# Patient Record
Sex: Female | Born: 1970
Health system: Southern US, Community
[De-identification: ages and names within clinical notes are randomized; demographics above are authoritative.]

## PROBLEM LIST (undated history)

## (undated) DIAGNOSIS — J849 Interstitial pulmonary disease, unspecified: Secondary | ICD-10-CM

## (undated) DIAGNOSIS — R002 Palpitations: Secondary | ICD-10-CM

## (undated) DIAGNOSIS — F419 Anxiety disorder, unspecified: Secondary | ICD-10-CM

## (undated) DIAGNOSIS — I420 Dilated cardiomyopathy: Secondary | ICD-10-CM

## (undated) DIAGNOSIS — F329 Major depressive disorder, single episode, unspecified: Secondary | ICD-10-CM

## (undated) DIAGNOSIS — I10 Essential (primary) hypertension: Secondary | ICD-10-CM

## (undated) DIAGNOSIS — A159 Respiratory tuberculosis unspecified: Secondary | ICD-10-CM

## (undated) DIAGNOSIS — F32A Depression, unspecified: Secondary | ICD-10-CM

## (undated) DIAGNOSIS — G4730 Sleep apnea, unspecified: Secondary | ICD-10-CM

## (undated) DIAGNOSIS — M51379 Other intervertebral disc degeneration, lumbosacral region without mention of lumbar back pain or lower extremity pain: Secondary | ICD-10-CM

## (undated) DIAGNOSIS — D75A Glucose-6-phosphate dehydrogenase (G6PD) deficiency without anemia: Secondary | ICD-10-CM

## (undated) DIAGNOSIS — E739 Lactose intolerance, unspecified: Secondary | ICD-10-CM

## (undated) DIAGNOSIS — R0600 Dyspnea, unspecified: Secondary | ICD-10-CM

## (undated) DIAGNOSIS — J40 Bronchitis, not specified as acute or chronic: Secondary | ICD-10-CM

## (undated) DIAGNOSIS — IMO0002 Reserved for concepts with insufficient information to code with codable children: Secondary | ICD-10-CM

## (undated) DIAGNOSIS — D649 Anemia, unspecified: Secondary | ICD-10-CM

## (undated) DIAGNOSIS — M069 Rheumatoid arthritis, unspecified: Secondary | ICD-10-CM

## (undated) DIAGNOSIS — M329 Systemic lupus erythematosus, unspecified: Secondary | ICD-10-CM

## (undated) DIAGNOSIS — M1990 Unspecified osteoarthritis, unspecified site: Secondary | ICD-10-CM

## (undated) DIAGNOSIS — J189 Pneumonia, unspecified organism: Secondary | ICD-10-CM

## (undated) DIAGNOSIS — K219 Gastro-esophageal reflux disease without esophagitis: Secondary | ICD-10-CM

## (undated) DIAGNOSIS — M7989 Other specified soft tissue disorders: Secondary | ICD-10-CM

## (undated) HISTORY — DX: Reserved for concepts with insufficient information to code with codable children: IMO0002

## (undated) HISTORY — DX: Systemic lupus erythematosus, unspecified: M32.9

## (undated) HISTORY — PX: TUBAL LIGATION: SHX77

## (undated) HISTORY — DX: Palpitations: R00.2

## (undated) HISTORY — DX: Other intervertebral disc degeneration, lumbosacral region without mention of lumbar back pain or lower extremity pain: M51.379

## (undated) HISTORY — DX: Interstitial pulmonary disease, unspecified: J84.9

## (undated) HISTORY — DX: Bronchitis, not specified as acute or chronic: J40

## (undated) HISTORY — DX: Unspecified osteoarthritis, unspecified site: M19.90

## (undated) HISTORY — DX: Lactose intolerance, unspecified: E73.9

## (undated) HISTORY — DX: Other specified soft tissue disorders: M79.89

## (undated) HISTORY — DX: Dilated cardiomyopathy: I42.0

## (undated) HISTORY — PX: ABDOMINAL HYSTERECTOMY: SHX81

## (undated) HISTORY — DX: Rheumatoid arthritis, unspecified: M06.9

## (undated) HISTORY — DX: Anemia, unspecified: D64.9

## (undated) HISTORY — DX: Glucose-6-phosphate dehydrogenase (G6PD) deficiency without anemia: D75.A

---
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
## 2008-06-23 HISTORY — PX: CHOLECYSTECTOMY: SHX55

## 2012-04-09 LAB — PULMONARY FUNCTION TEST

## 2014-08-08 DIAGNOSIS — I7300 Raynaud's syndrome without gangrene: Secondary | ICD-10-CM | POA: Insufficient documentation

## 2014-08-23 ENCOUNTER — Encounter: Payer: Self-pay | Admitting: Internal Medicine

## 2014-08-23 ENCOUNTER — Ambulatory Visit (INDEPENDENT_AMBULATORY_CARE_PROVIDER_SITE_OTHER): Payer: Managed Care, Other (non HMO) | Admitting: Internal Medicine

## 2014-08-23 ENCOUNTER — Other Ambulatory Visit (HOSPITAL_COMMUNITY): Payer: Self-pay | Admitting: Rheumatology

## 2014-08-23 VITALS — BP 122/80 | HR 86 | Ht 71.0 in | Wt 226.0 lb

## 2014-08-23 DIAGNOSIS — M3219 Other organ or system involvement in systemic lupus erythematosus: Secondary | ICD-10-CM | POA: Insufficient documentation

## 2014-08-23 DIAGNOSIS — M069 Rheumatoid arthritis, unspecified: Secondary | ICD-10-CM

## 2014-08-23 DIAGNOSIS — M329 Systemic lupus erythematosus, unspecified: Secondary | ICD-10-CM | POA: Insufficient documentation

## 2014-08-23 DIAGNOSIS — J849 Interstitial pulmonary disease, unspecified: Secondary | ICD-10-CM | POA: Insufficient documentation

## 2014-08-23 DIAGNOSIS — R0600 Dyspnea, unspecified: Secondary | ICD-10-CM

## 2014-08-23 NOTE — Patient Instructions (Addendum)
ICD-9-CM ICD-10-CM   1. Dyspnea 786.09 R06.00 CT Chest High Resolution     Pulmonary Function Test     2D Echocardiogram without contrast     Pulse oximetry, overnight  2. ILD (interstitial lung disease) 515 J84.9 CT Chest High Resolution     Pulmonary Function Test     Pulse oximetry, overnight  3. Lupus 710.0 M32.9   4. Rheumatoid arthritis 714.0 M06.9    - get HRCT chest  - get full PFT - get ECHO - rule out pulmonary hypertension - do ONO test on room air  - do 6 minute walk test  Followup  - return after all of above in next few weeks to see me or my NP Tammy

## 2014-08-23 NOTE — Progress Notes (Signed)
Subjective:    Patient ID: Shannon [name], female    DOB: 03-Jun-1971, 44 y.o.   MRN: [id number] PCP [name],[name], PA-C Referred by Dr [name]  HPI   IOV 08/23/2014  Chief Complaint  Patient presents with  . Pulmonary Consult    Pt referred by Dr. Corliss Skains for ILD. Pt c/o SOB with activity and rest, dry cough and chest tightness also with and without activity.    44 year old female referred for evaluation of autoimmune interstitial lung disease. She presents with her husband. In 2010 while living in [place] she reports she was diagnosed with lupus associated with rheumatoid arthritis in her joints. In 2012 she moved to live in [place] and several months after that started noticing insidious onset of shortness of breath. Local rheumatologist diagnosed her with interstitial lung disease. She was referred to Dr. Herma Carson at [facility] in [facility] and was started on CellCept/prednisone for autoimmune interstitial lung disease he and however, sometime around 2 years ago she ran out of medical insurance and stopped taking these medications. During this time her dyspnea has progressed. It is currently rated as moderate to severe. It is present on exertion and relieved by rest. Even minimal amount of exertion makes her very dyspneic. Now she has insurance and she did see Dr. Frederik Pear locally and she has autoimmune panel lab ordered. In addition exam revealed crackles and there for she's been referred here for reevaluation of interstitial lung disease and dyspnea. Dyspnea is associated with some chest tightness but no chest pain. This no associated dizziness.     has no past medical history on file.   reports that she has been smoking Cigarettes.  She has a 7 pack-year smoking history. She has never used smokeless tobacco.  Past Surgical History  Procedure Laterality Date  . Cesarean section      X 2  . Cholecystectomy    . Tubal ligation    .  Appendectomy      Allergies  Allergen Reactions  . Asa [Aspirin]     HIves  . Sulfa Antibiotics     G6PD deficiency     Immunization History  Administered Date(s) Administered  . Influenza Split 03/23/2014  . Pneumococcal-Unspecified 08/23/2014    Family History  Problem Relation Age of Onset  . Sarcoidosis Mother      Current outpatient prescriptions:  .  albuterol (PROVENTIL HFA;VENTOLIN HFA) 108 (90 BASE) MCG/ACT inhaler, Inhale 2 puffs into the lungs every 6 (six) hours as needed for wheezing or shortness of breath., Disp: , Rfl:  .  predniSONE (DELTASONE) 10 MG tablet, Take 10 mg by mouth daily with breakfast., Disp: , Rfl:  .  PRESCRIPTION MEDICATION, Muscle relaxer-pt unsure of name, Disp: , Rfl:      Review of Systems  Constitutional: Negative for fever and unexpected weight change.  HENT: Positive for trouble swallowing. Negative for congestion, dental problem, ear pain, nosebleeds, postnasal drip, rhinorrhea, sinus pressure, sneezing and sore throat.   Eyes: Negative for redness and itching.  Respiratory: Positive for cough, chest tightness and shortness of breath. Negative for wheezing.   Cardiovascular: Positive for palpitations and leg swelling.  Gastrointestinal: Positive for nausea. Negative for vomiting.  Genitourinary: Negative for dysuria.  Musculoskeletal: Positive for joint swelling.  Skin: Positive for rash.  Neurological: Negative for headaches.  Hematological: Does not bruise/bleed easily.  Psychiatric/Behavioral: Negative for dysphoric mood. The patient is not nervous/anxious.        Objective:  Physical Exam  Constitutional: She is oriented to person, place, and time. She appears well-developed and well-nourished. No distress.  Body mass index is 31.53 kg/(m^2).   HENT:  Head: Normocephalic and atraumatic.  Right Ear: External ear normal.  Left Ear: External ear normal.  Mouth/Throat: Oropharynx is clear and moist. No oropharyngeal  exudate.  Eyes: Conjunctivae and EOM are normal. Pupils are equal, round, and reactive to light. Right eye exhibits no discharge. Left eye exhibits no discharge. No scleral icterus.  Neck: Normal range of motion. Neck supple. No JVD present. No tracheal deviation present. No thyromegaly present.  Cardiovascular: Normal rate, regular rhythm, normal heart sounds and intact distal pulses.  Exam reveals no gallop and no friction rub.   No murmur heard. Pulmonary/Chest: Effort normal. No respiratory distress. She has no wheezes. She has rales. She exhibits no tenderness.  Crackles at base  Abdominal: Soft. Bowel sounds are normal. She exhibits no distension and no mass. There is no tenderness. There is no rebound and no guarding.  Musculoskeletal: Normal range of motion. She exhibits no edema or tenderness.  Lymphadenopathy:    She has no cervical adenopathy.  Neurological: She is alert and oriented to person, place, and time. She has normal reflexes. No cranial nerve deficit. She exhibits normal muscle tone. Coordination normal.  Skin: Skin is warm and dry. No rash noted. She is not diaphoretic. No erythema. No pallor.  tatoo  Psychiatric: She has a normal mood and affect. Her behavior is normal. Judgment and thought content normal.  Vitals reviewed.   Filed Vitals:   08/23/14 1336  BP: 122/80  Pulse: 86  Height: 5\' 11"  (1.803 m)  Weight: 226 lb (102.513 kg)  SpO2: 100%          Assessment & Plan:     ICD-9-CM ICD-10-CM   1. Dyspnea 786.09 R06.00 CT Chest High Resolution     Pulmonary Function Test     2D Echocardiogram without contrast     Pulse oximetry, overnight  2. ILD (interstitial lung disease) 515 J84.9 CT Chest High Resolution     Pulmonary Function Test     Pulse oximetry, overnight  3. Lupus 710.0 M32.9   4. Rheumatoid arthritis 714.0 M06.9    2 most common causes for involvement of the pulmonary system and the presence of autoimmune disease is both interstitial  lung disease and pulmonary hypertension. She's high pretest probably ready for interstitial lung disease but could also have pulmonary hypertension causing her symptoms. We need to proceed with establishing the presence of of these entities and also assess severity. Based on this we can discuss about therapy which for interstitial lung disease most likely involves CellCept for pulmonary hypertension specific diseas specific disease modulating drugs   REC  - get HRCT chest  - get full PFT - get ECHO - rule out pulmonary hypertension - do ONO test on room air  - do 6 minute walk test  Followup  - return after all of above in next few weeks to see me or my NP Tammy    Dr. , M.D., [facility].C.P Pulmonary and Critical Care Medicine Staff Physician [facility] Pulmonary and Critical Care Pager: [phone], If no answer or between  15:00h - 7:00h: call [phone]  08/23/2014 2:00 PM

## 2014-09-01 ENCOUNTER — Inpatient Hospital Stay: Admission: RE | Admit: 2014-09-01 | Payer: Managed Care, Other (non HMO) | Source: Ambulatory Visit

## 2014-09-01 ENCOUNTER — Other Ambulatory Visit (HOSPITAL_COMMUNITY): Payer: Self-pay | Admitting: Radiology

## 2014-09-01 ENCOUNTER — Ambulatory Visit (HOSPITAL_COMMUNITY): Payer: Managed Care, Other (non HMO) | Attending: Internal Medicine | Admitting: Radiology

## 2014-09-01 DIAGNOSIS — R0600 Dyspnea, unspecified: Secondary | ICD-10-CM

## 2014-09-01 DIAGNOSIS — R0602 Shortness of breath: Secondary | ICD-10-CM | POA: Diagnosis not present

## 2014-09-01 NOTE — Progress Notes (Signed)
Echocardiogram performed.  

## 2014-09-06 NOTE — Progress Notes (Signed)
Quick Note:  Called and spoke to pt. Informed pt of the results and recs per MR. Pt verbalized understanding and denied any further questions or concerns at this time. ______ 

## 2014-09-07 ENCOUNTER — Other Ambulatory Visit: Payer: Managed Care, Other (non HMO)

## 2014-09-07 ENCOUNTER — Inpatient Hospital Stay: Admission: RE | Admit: 2014-09-07 | Payer: Managed Care, Other (non HMO) | Source: Ambulatory Visit

## 2014-09-14 ENCOUNTER — Telehealth: Payer: Self-pay | Admitting: Internal Medicine

## 2014-09-14 NOTE — Telephone Encounter (Signed)
ONO 08/28/14 pulse ox < 88% - 0.21minutes, loest pulse ox 85%. Nearly normal. Does not need overnight o2.  She is seeing TP in a few weeks/ No need t call her unless she calls back

## 2014-09-20 NOTE — Telephone Encounter (Signed)
LMTCB with the pt's spouse  

## 2014-09-22 ENCOUNTER — Telehealth: Payer: Self-pay | Admitting: Internal Medicine

## 2014-09-25 ENCOUNTER — Other Ambulatory Visit: Payer: Managed Care, Other (non HMO)

## 2014-09-25 ENCOUNTER — Ambulatory Visit (HOSPITAL_COMMUNITY)
Admission: RE | Admit: 2014-09-25 | Discharge: 2014-09-25 | Disposition: A | Discharge status: Home / Self Care | Payer: Managed Care, Other (non HMO) | Source: Ambulatory Visit | Attending: Rheumatology | Admitting: Rheumatology

## 2014-09-25 ENCOUNTER — Other Ambulatory Visit (HOSPITAL_COMMUNITY): Payer: Self-pay | Admitting: Rheumatology

## 2014-09-25 ENCOUNTER — Ambulatory Visit (HOSPITAL_COMMUNITY): Payer: Managed Care, Other (non HMO)

## 2014-09-25 ENCOUNTER — Encounter (HOSPITAL_COMMUNITY): Payer: Self-pay

## 2014-09-25 DIAGNOSIS — M069 Rheumatoid arthritis, unspecified: Secondary | ICD-10-CM | POA: Diagnosis not present

## 2014-09-25 DIAGNOSIS — J84115 Respiratory bronchiolitis interstitial lung disease: Secondary | ICD-10-CM

## 2014-09-25 DIAGNOSIS — M329 Systemic lupus erythematosus, unspecified: Secondary | ICD-10-CM | POA: Diagnosis present

## 2014-09-25 NOTE — Telephone Encounter (Signed)
PCC's, please advise. Thanks 

## 2014-09-25 NOTE — Telephone Encounter (Signed)
This was authorized by dr Marchelle Gearing  On 09/22/14 pt aware auth# put in chart Shannon [name]

## 2014-09-28 NOTE — Telephone Encounter (Signed)
Lmtcb.

## 2014-09-29 NOTE — Telephone Encounter (Signed)
Pt is aware of ONO results. Nothing further was needed. 

## 2014-10-04 ENCOUNTER — Encounter: Payer: Self-pay | Admitting: Internal Medicine

## 2014-10-09 ENCOUNTER — Ambulatory Visit (INDEPENDENT_AMBULATORY_CARE_PROVIDER_SITE_OTHER): Payer: 59 | Admitting: Internal Medicine

## 2014-10-09 ENCOUNTER — Other Ambulatory Visit (INDEPENDENT_AMBULATORY_CARE_PROVIDER_SITE_OTHER): Payer: 59

## 2014-10-09 ENCOUNTER — Encounter: Payer: Self-pay | Admitting: Pulmonary Disease

## 2014-10-09 ENCOUNTER — Ambulatory Visit (INDEPENDENT_AMBULATORY_CARE_PROVIDER_SITE_OTHER): Payer: 59 | Admitting: Pulmonary Disease

## 2014-10-09 ENCOUNTER — Ambulatory Visit: Payer: 59 | Admitting: Adult Health

## 2014-10-09 VITALS — BP 124/74 | HR 70 | Temp 97.1°F | Ht 71.5 in | Wt 231.0 lb

## 2014-10-09 DIAGNOSIS — J849 Interstitial pulmonary disease, unspecified: Secondary | ICD-10-CM

## 2014-10-09 DIAGNOSIS — R0600 Dyspnea, unspecified: Secondary | ICD-10-CM

## 2014-10-09 LAB — PULMONARY FUNCTION TEST
DL/VA % pred: 76 %
DL/VA: 4.26 ml/min/mmHg/L
DLCO UNC % PRED: 43 %
DLCO UNC: 15.03 ml/min/mmHg
FEF 25-75 POST: 2.1 L/s
FEF 25-75 PRE: 2.07 L/s
FEF2575-%Change-Post: 1 %
FEF2575-%Pred-Post: 63 %
FEF2575-%Pred-Pre: 62 %
FEV1-%CHANGE-POST: -1 %
FEV1-%Pred-Post: 63 %
FEV1-%Pred-Pre: 64 %
FEV1-Post: 2.04 L
FEV1-Pre: 2.08 L
FEV1FVC-%Change-Post: 1 %
FEV1FVC-%Pred-Pre: 98 %
FEV6-%Change-Post: -2 %
FEV6-%PRED-POST: 63 %
FEV6-%PRED-PRE: 65 %
FEV6-POST: 2.46 L
FEV6-PRE: 2.52 L
FEV6FVC-%Change-Post: 0 %
FEV6FVC-%PRED-POST: 102 %
FEV6FVC-%PRED-PRE: 101 %
FVC-%Change-Post: -3 %
FVC-%PRED-POST: 62 %
FVC-%PRED-PRE: 64 %
FVC-PRE: 2.54 L
FVC-Post: 2.46 L
POST FEV6/FVC RATIO: 100 %
Post FEV1/FVC ratio: 83 %
Pre FEV1/FVC ratio: 82 %
Pre FEV6/FVC Ratio: 99 %
RV % pred: 28 %
RV: 0.56 L
TLC % pred: 56 %
TLC: 3.48 L

## 2014-10-09 LAB — CBC WITH DIFFERENTIAL/PLATELET
BASOS ABS: 0 10*3/uL (ref 0.0–0.1)
Basophils Relative: 0.2 % (ref 0.0–3.0)
EOS ABS: 0.1 10*3/uL (ref 0.0–0.7)
Eosinophils Relative: 1.5 % (ref 0.0–5.0)
HCT: 29.1 % — ABNORMAL LOW (ref 36.0–46.0)
Hemoglobin: 9.6 g/dL — ABNORMAL LOW (ref 12.0–15.0)
LYMPHS PCT: 31.4 % (ref 12.0–46.0)
Lymphs Abs: 1.3 10*3/uL (ref 0.7–4.0)
MCHC: 33.1 g/dL (ref 30.0–36.0)
MCV: 87 fl (ref 78.0–100.0)
Monocytes Absolute: 0.3 10*3/uL (ref 0.1–1.0)
Monocytes Relative: 6.8 % (ref 3.0–12.0)
NEUTROS PCT: 60.1 % (ref 43.0–77.0)
Neutro Abs: 2.4 10*3/uL (ref 1.4–7.7)
Platelets: 236 10*3/uL (ref 150.0–400.0)
RBC: 3.35 Mil/uL — ABNORMAL LOW (ref 3.87–5.11)
RDW: 15.1 % (ref 11.5–15.5)
WBC: 4.1 10*3/uL (ref 4.0–10.5)

## 2014-10-09 LAB — BASIC METABOLIC PANEL
BUN: 13 mg/dL (ref 6–23)
CALCIUM: 8.9 mg/dL (ref 8.4–10.5)
CHLORIDE: 104 meq/L (ref 96–112)
CO2: 28 meq/L (ref 19–32)
Creatinine, Ser: 0.67 mg/dL (ref 0.40–1.20)
GFR: 124.22 mL/min (ref >60.00)
GLUCOSE: 105 mg/dL — AB (ref 70–99)
POTASSIUM: 3.4 meq/L — AB (ref 3.5–5.1)
Sodium: 137 mEq/L (ref 135–145)

## 2014-10-09 LAB — HEPATIC FUNCTION PANEL
ALT: 10 U/L (ref 0–35)
AST: 14 U/L (ref 0–37)
Albumin: 3.7 g/dL (ref 3.5–5.2)
Alkaline Phosphatase: 57 U/L (ref 39–117)
Bilirubin, Direct: 0.1 mg/dL (ref 0.0–0.3)
Total Bilirubin: 0.3 mg/dL (ref 0.2–1.2)
Total Protein: 7.7 g/dL (ref 6.0–8.3)

## 2014-10-09 MED ORDER — DAPSONE 100 MG PO TABS: 100.0000 mg | ORAL_TABLET | Freq: Every day | ORAL | Status: DC

## 2014-10-09 MED ORDER — MYCOPHENOLATE MOFETIL 500 MG PO TABS: ORAL_TABLET | ORAL | Status: DC

## 2014-10-09 NOTE — Progress Notes (Signed)
PFT done today. 

## 2014-10-09 NOTE — Assessment & Plan Note (Signed)
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 She also has significant dyspnea on exertion, and underlying rheumatoid arthritis/lupus. She has been on CellCept in the past, and although she did not like taking it, she did not have any significant reaction. I think it is very likely that her autoimmune process is causing her interstitial disease, and I would therefore like to restart her on CellCept and keep her on low-dose prednisone for now. She will also need PCP prophylaxis, and will start dapsone since she is allergic to sulfa.  She will follow-up in 4 weeks with her primary pulmonologist, and understands that she has to keep up with her lab work.

## 2014-10-09 NOTE — Patient Instructions (Signed)
Stay on current prednisone Will start back on cellcept 500mg  each day for one week, then 500mg  one in am and pm for one week, then 1000mg  in am and 500mg  in pm for one week, then take 1000mg  in am and pm until next visit Will need to take dapsone 100mg  one a day while on cellcept Need to check bloodwork today, then every week until next visit.  followup with Dr. in 4 weeks.

## 2014-10-09 NOTE — Progress Notes (Signed)
   Subjective:    Patient ID: Shannon [name], female    DOB: 02-08-1971, 44 y.o.   MRN: [id number]  HPI The patient comes in today for follow-up of her recent high-resolution CT and also PFTs. She is normally followed by Dr. Marchelle Gearing, and has a known interstitial lung disease as well as a diagnosis of rheumatoid arthritis/lupus. She has been on CellCept in the past with low-dose prednisone and tolerated well, but did not have any insurance and was unable to continue. Her high-resolution CT showed classic changes for fibrotic NSIP, and her PFTs showed restriction as well as a severe decrease in diffusion capacity. The patient continues to have significant dyspnea on exertion. I have reviewed the above studies with her and her family member in detail, and answered all of their questions.   Review of Systems  Constitutional: Negative for fever and unexpected weight change.  HENT: Negative for congestion, dental problem, ear pain, nosebleeds, postnasal drip, rhinorrhea, sinus pressure, sneezing, sore throat and trouble swallowing.   Eyes: Negative for redness and itching.  Respiratory: Positive for cough, chest tightness, shortness of breath and wheezing.   Cardiovascular: Negative for palpitations and leg swelling.  Gastrointestinal: Negative for nausea and vomiting.  Genitourinary: Negative for dysuria.  Musculoskeletal: Negative for joint swelling.  Skin: Negative for rash.  Neurological: Negative for headaches.  Hematological: Does not bruise/bleed easily.  Psychiatric/Behavioral: Negative for dysphoric mood. The patient is not nervous/anxious.        Objective:   Physical Exam Overweight female in no acute distress Nose without purulence or discharge noted Neck without lymphadenopathy or thyromegaly Chest with basilar crackles, no wheezing Cardiac exam with regular rate and rhythm Lower extremities with mild edema, no cyanosis Alert and oriented, moves all 4 extremities.         Assessment & Plan:

## 2014-10-10 ENCOUNTER — Telehealth: Payer: Self-pay | Admitting: Internal Medicine

## 2014-10-10 ENCOUNTER — Telehealth: Payer: Self-pay | Admitting: Pulmonary Disease

## 2014-10-10 DIAGNOSIS — J849 Interstitial pulmonary disease, unspecified: Secondary | ICD-10-CM

## 2014-10-10 DIAGNOSIS — M329 Systemic lupus erythematosus, unspecified: Secondary | ICD-10-CM

## 2014-10-10 NOTE — Telephone Encounter (Signed)
Spoke with the pt  She states that she was prescribed Dapsone by [facility] 10/10/14 She is afraid to take this due to her anemia and possibility it will lower her RBC  She wants to know what MR thinks Please advise thanks

## 2014-10-10 NOTE — Telephone Encounter (Signed)
Received medical records from [facility]. Sent to Dr. Marchelle Gearing. 10/10/14/ss

## 2014-10-12 NOTE — Telephone Encounter (Signed)
Spoke with patient's husband, entered lab order and advised patient to come in to have drawn tomorrow.  Patient's husband will let patient know. Nothing further needed.

## 2014-10-12 NOTE — Telephone Encounter (Signed)
Let her know " I can understand her concern. Dr Shelle Iron and I  Had discussed before he recommended Dapsone so I was aware. This is to prevent rare but really possible unusual infections in body. However, given her concern for anemia - hold off dapsone for now. HAve her come in next few days and check G6PD .and I can discuss at fu"  If you have trouble ordering it le tm eknow

## 2014-10-19 ENCOUNTER — Other Ambulatory Visit (INDEPENDENT_AMBULATORY_CARE_PROVIDER_SITE_OTHER): Payer: 59

## 2014-10-19 DIAGNOSIS — J849 Interstitial pulmonary disease, unspecified: Secondary | ICD-10-CM

## 2014-10-19 DIAGNOSIS — M329 Systemic lupus erythematosus, unspecified: Secondary | ICD-10-CM

## 2014-10-19 LAB — BASIC METABOLIC PANEL
BUN: 11 mg/dL (ref 6–23)
CHLORIDE: 101 meq/L (ref 96–112)
CO2: 29 mEq/L (ref 19–32)
Calcium: 9.3 mg/dL (ref 8.4–10.5)
Creatinine, Ser: 0.65 mg/dL (ref 0.40–1.20)
GFR: 128.62 mL/min (ref >60.00)
GLUCOSE: 87 mg/dL (ref 70–99)
Potassium: 3.6 mEq/L (ref 3.5–5.1)
SODIUM: 135 meq/L (ref 135–145)

## 2014-10-19 LAB — CBC WITH DIFFERENTIAL/PLATELET
Basophils Absolute: 0 10*3/uL (ref 0.0–0.1)
Basophils Relative: 0.6 % (ref 0.0–3.0)
Eosinophils Absolute: 0 10*3/uL (ref 0.0–0.7)
Eosinophils Relative: 0.1 % (ref 0.0–5.0)
HCT: 31 % — ABNORMAL LOW (ref 36.0–46.0)
Hemoglobin: 10.2 g/dL — ABNORMAL LOW (ref 12.0–15.0)
Lymphocytes Relative: 27.9 % (ref 12.0–46.0)
Lymphs Abs: 1.7 10*3/uL (ref 0.7–4.0)
MCHC: 33 g/dL (ref 30.0–36.0)
MCV: 87.1 fl (ref 78.0–100.0)
Monocytes Absolute: 0.3 10*3/uL (ref 0.1–1.0)
Monocytes Relative: 4.9 % (ref 3.0–12.0)
Neutro Abs: 4.2 10*3/uL (ref 1.4–7.7)
Neutrophils Relative %: 66.5 % (ref 43.0–77.0)
PLATELETS: 305 10*3/uL (ref 150.0–400.0)
RBC: 3.56 Mil/uL — ABNORMAL LOW (ref 3.87–5.11)
RDW: 15.3 % (ref 11.5–15.5)
WBC: 6.3 10*3/uL (ref 4.0–10.5)

## 2014-10-19 LAB — HEPATIC FUNCTION PANEL
ALK PHOS: 61 U/L (ref 39–117)
ALT: 8 U/L (ref 0–35)
AST: 10 U/L (ref 0–37)
Albumin: 4 g/dL (ref 3.5–5.2)
BILIRUBIN TOTAL: 0.3 mg/dL (ref 0.2–1.2)
Bilirubin, Direct: 0.1 mg/dL (ref 0.0–0.3)
Total Protein: 7.9 g/dL (ref 6.0–8.3)

## 2014-10-23 LAB — GLUCOSE 6 PHOSPHATE DEHYDROGENASE: G-6PDH: 4.1 U/g Hgb — ABNORMAL LOW (ref 7.0–20.5)

## 2014-10-25 ENCOUNTER — Telehealth: Payer: Self-pay | Admitting: Internal Medicine

## 2014-10-25 DIAGNOSIS — D75A Glucose-6-phosphate dehydrogenase (G6PD) deficiency without anemia: Secondary | ICD-10-CM

## 2014-10-25 NOTE — Telephone Encounter (Signed)
Let Shannon [name] know that G6PD level is low and therefore Dapsone is no go.  Please also add G6PD def into her problem list Let her know that when she comes and sees me will review

## 2014-10-25 NOTE — Telephone Encounter (Signed)
Called and spoke to pt's husband. Left message to have pt call back. Pt's husband stated pt would not be home till Friday 5/6. Will call back then.

## 2014-10-27 ENCOUNTER — Other Ambulatory Visit (INDEPENDENT_AMBULATORY_CARE_PROVIDER_SITE_OTHER): Payer: 59

## 2014-10-27 DIAGNOSIS — J849 Interstitial pulmonary disease, unspecified: Secondary | ICD-10-CM | POA: Diagnosis not present

## 2014-10-27 LAB — BASIC METABOLIC PANEL
BUN: 9 mg/dL (ref 6–23)
CHLORIDE: 104 meq/L (ref 96–112)
CO2: 28 mEq/L (ref 19–32)
Calcium: 9 mg/dL (ref 8.4–10.5)
Creatinine, Ser: 0.71 mg/dL (ref 0.40–1.20)
GFR: 116.15 mL/min (ref >60.00)
GLUCOSE: 102 mg/dL — AB (ref 70–99)
POTASSIUM: 3.4 meq/L — AB (ref 3.5–5.1)
Sodium: 138 mEq/L (ref 135–145)

## 2014-10-27 LAB — CBC WITH DIFFERENTIAL/PLATELET
Basophils Absolute: 0 10*3/uL (ref 0.0–0.1)
Basophils Relative: 0.9 % (ref 0.0–3.0)
Eosinophils Absolute: 0 10*3/uL (ref 0.0–0.7)
Eosinophils Relative: 0.9 % (ref 0.0–5.0)
HCT: 30.1 % — ABNORMAL LOW (ref 36.0–46.0)
Hemoglobin: 9.9 g/dL — ABNORMAL LOW (ref 12.0–15.0)
LYMPHS PCT: 33.2 % (ref 12.0–46.0)
Lymphs Abs: 1.8 10*3/uL (ref 0.7–4.0)
MCHC: 33 g/dL (ref 30.0–36.0)
MCV: 87.7 fl (ref 78.0–100.0)
MONOS PCT: 6.1 % (ref 3.0–12.0)
Monocytes Absolute: 0.3 10*3/uL (ref 0.1–1.0)
NEUTROS ABS: 3.2 10*3/uL (ref 1.4–7.7)
Neutrophils Relative %: 58.9 % (ref 43.0–77.0)
Platelets: 238 10*3/uL (ref 150.0–400.0)
RBC: 3.43 Mil/uL — ABNORMAL LOW (ref 3.87–5.11)
RDW: 15.4 % (ref 11.5–15.5)
WBC: 5.4 10*3/uL (ref 4.0–10.5)

## 2014-10-27 LAB — HEPATIC FUNCTION PANEL
ALT: 9 U/L (ref 0–35)
AST: 11 U/L (ref 0–37)
Albumin: 3.5 g/dL (ref 3.5–5.2)
Alkaline Phosphatase: 55 U/L (ref 39–117)
BILIRUBIN TOTAL: 0.3 mg/dL (ref 0.2–1.2)
Bilirubin, Direct: 0.1 mg/dL (ref 0.0–0.3)
Total Protein: 7.6 g/dL (ref 6.0–8.3)

## 2014-10-31 DIAGNOSIS — D75A Glucose-6-phosphate dehydrogenase (G6PD) deficiency without anemia: Secondary | ICD-10-CM | POA: Insufficient documentation

## 2014-10-31 NOTE — Telephone Encounter (Signed)
lmtcb for pt. G6PD deficiency added to problem list.

## 2014-11-01 NOTE — Telephone Encounter (Signed)
[phone], pt cb

## 2014-11-01 NOTE — Telephone Encounter (Signed)
Spoke with the pt and notified of recs per MR  She verbalized understanding  Nothing further needed 

## 2014-11-02 ENCOUNTER — Other Ambulatory Visit (INDEPENDENT_AMBULATORY_CARE_PROVIDER_SITE_OTHER): Payer: 59

## 2014-11-02 ENCOUNTER — Telehealth: Payer: Self-pay | Admitting: Internal Medicine

## 2014-11-02 DIAGNOSIS — J849 Interstitial pulmonary disease, unspecified: Secondary | ICD-10-CM

## 2014-11-02 LAB — BASIC METABOLIC PANEL
BUN: 13 mg/dL (ref 6–23)
CALCIUM: 9 mg/dL (ref 8.4–10.5)
CO2: 29 mEq/L (ref 19–32)
Chloride: 104 mEq/L (ref 96–112)
Creatinine, Ser: 0.6 mg/dL (ref 0.40–1.20)
GFR: 141.05 mL/min (ref >60.00)
GLUCOSE: 87 mg/dL (ref 70–99)
POTASSIUM: 3.3 meq/L — AB (ref 3.5–5.1)
SODIUM: 137 meq/L (ref 135–145)

## 2014-11-02 LAB — HEPATIC FUNCTION PANEL
ALT: 8 U/L (ref 0–35)
AST: 11 U/L (ref 0–37)
Albumin: 3.4 g/dL — ABNORMAL LOW (ref 3.5–5.2)
Alkaline Phosphatase: 55 U/L (ref 39–117)
Bilirubin, Direct: 0.1 mg/dL (ref 0.0–0.3)
TOTAL PROTEIN: 7.5 g/dL (ref 6.0–8.3)
Total Bilirubin: 0.3 mg/dL (ref 0.2–1.2)

## 2014-11-02 LAB — CBC WITH DIFFERENTIAL/PLATELET
BASOS ABS: 0 10*3/uL (ref 0.0–0.1)
Basophils Relative: 0.5 % (ref 0.0–3.0)
Eosinophils Absolute: 0.1 10*3/uL (ref 0.0–0.7)
Eosinophils Relative: 1.3 % (ref 0.0–5.0)
HEMATOCRIT: 28.6 % — AB (ref 36.0–46.0)
Hemoglobin: 9.7 g/dL — ABNORMAL LOW (ref 12.0–15.0)
LYMPHS ABS: 1.6 10*3/uL (ref 0.7–4.0)
Lymphocytes Relative: 31.2 % (ref 12.0–46.0)
MCHC: 33.9 g/dL (ref 30.0–36.0)
MCV: 85.8 fl (ref 78.0–100.0)
MONOS PCT: 6.1 % (ref 3.0–12.0)
Monocytes Absolute: 0.3 10*3/uL (ref 0.1–1.0)
Neutro Abs: 3.1 10*3/uL (ref 1.4–7.7)
Neutrophils Relative %: 60.9 % (ref 43.0–77.0)
Platelets: 222 10*3/uL (ref 150.0–400.0)
RBC: 3.33 Mil/uL — ABNORMAL LOW (ref 3.87–5.11)
RDW: 15.4 % (ref 11.5–15.5)
WBC: 5.1 10*3/uL (ref 4.0–10.5)

## 2014-11-02 MED ORDER — MYCOPHENOLATE MOFETIL 500 MG PO TABS: 1000.0000 mg | ORAL_TABLET | Freq: Two times a day (BID) | ORAL | Status: DC

## 2014-11-02 NOTE — Telephone Encounter (Signed)
Rx has been sent in. Pt is aware. Nothing further was needed. 

## 2014-11-10 ENCOUNTER — Other Ambulatory Visit (INDEPENDENT_AMBULATORY_CARE_PROVIDER_SITE_OTHER): Payer: Managed Care, Other (non HMO)

## 2014-11-10 DIAGNOSIS — J849 Interstitial pulmonary disease, unspecified: Secondary | ICD-10-CM | POA: Diagnosis not present

## 2014-11-10 LAB — CBC WITH DIFFERENTIAL/PLATELET
Basophils Absolute: 0 10*3/uL (ref 0.0–0.1)
Basophils Relative: 0.9 % (ref 0.0–3.0)
EOS ABS: 0.1 10*3/uL (ref 0.0–0.7)
EOS PCT: 1.4 % (ref 0.0–5.0)
HCT: 29.1 % — ABNORMAL LOW (ref 36.0–46.0)
HEMOGLOBIN: 9.7 g/dL — AB (ref 12.0–15.0)
LYMPHS PCT: 37.7 % (ref 12.0–46.0)
Lymphs Abs: 1.7 10*3/uL (ref 0.7–4.0)
MCHC: 33.1 g/dL (ref 30.0–36.0)
MCV: 87.5 fl (ref 78.0–100.0)
MONOS PCT: 8.5 % (ref 3.0–12.0)
Monocytes Absolute: 0.4 10*3/uL (ref 0.1–1.0)
NEUTROS ABS: 2.4 10*3/uL (ref 1.4–7.7)
NEUTROS PCT: 51.5 % (ref 43.0–77.0)
Platelets: 261 10*3/uL (ref 150.0–400.0)
RBC: 3.33 Mil/uL — AB (ref 3.87–5.11)
RDW: 15.5 % (ref 11.5–15.5)
WBC: 4.6 10*3/uL (ref 4.0–10.5)

## 2014-11-10 LAB — HEPATIC FUNCTION PANEL
ALBUMIN: 3.7 g/dL (ref 3.5–5.2)
ALT: 9 U/L (ref 0–35)
AST: 12 U/L (ref 0–37)
Alkaline Phosphatase: 59 U/L (ref 39–117)
BILIRUBIN DIRECT: 0.1 mg/dL (ref 0.0–0.3)
TOTAL PROTEIN: 7.8 g/dL (ref 6.0–8.3)
Total Bilirubin: 0.3 mg/dL (ref 0.2–1.2)

## 2014-11-10 LAB — BASIC METABOLIC PANEL
BUN: 10 mg/dL (ref 6–23)
CHLORIDE: 104 meq/L (ref 96–112)
CO2: 27 mEq/L (ref 19–32)
Calcium: 8.9 mg/dL (ref 8.4–10.5)
Creatinine, Ser: 0.67 mg/dL (ref 0.40–1.20)
GFR: 124.17 mL/min (ref >60.00)
GLUCOSE: 84 mg/dL (ref 70–99)
POTASSIUM: 3.6 meq/L (ref 3.5–5.1)
Sodium: 136 mEq/L (ref 135–145)

## 2014-11-13 ENCOUNTER — Telehealth: Payer: Self-pay | Admitting: Internal Medicine

## 2014-11-13 NOTE — Telephone Encounter (Signed)
Pt returned call. Call her husband & give him info. Husband's name is [name] [phone]

## 2014-11-13 NOTE — Telephone Encounter (Signed)
Pt scheduled for appt for OV with TP as MR does not have anything sooner than July.  Scheduled for June 3 at 10:15 Nothing further needed.

## 2014-11-13 NOTE — Telephone Encounter (Signed)
atc pt, no answer, no vm set up.  Wcb.  

## 2014-11-15 ENCOUNTER — Ambulatory Visit: Payer: 59 | Admitting: Internal Medicine

## 2014-11-16 ENCOUNTER — Other Ambulatory Visit (INDEPENDENT_AMBULATORY_CARE_PROVIDER_SITE_OTHER): Payer: Managed Care, Other (non HMO)

## 2014-11-16 DIAGNOSIS — J849 Interstitial pulmonary disease, unspecified: Secondary | ICD-10-CM

## 2014-11-16 LAB — CBC WITH DIFFERENTIAL/PLATELET
BASOS ABS: 0 10*3/uL (ref 0.0–0.1)
Basophils Relative: 0.5 % (ref 0.0–3.0)
Eosinophils Absolute: 0 10*3/uL (ref 0.0–0.7)
Eosinophils Relative: 1 % (ref 0.0–5.0)
HCT: 30 % — ABNORMAL LOW (ref 36.0–46.0)
Hemoglobin: 9.7 g/dL — ABNORMAL LOW (ref 12.0–15.0)
Lymphocytes Relative: 34.3 % (ref 12.0–46.0)
Lymphs Abs: 1.5 10*3/uL (ref 0.7–4.0)
MCHC: 32.4 g/dL (ref 30.0–36.0)
MCV: 89 fl (ref 78.0–100.0)
MONO ABS: 0.3 10*3/uL (ref 0.1–1.0)
Monocytes Relative: 7.6 % (ref 3.0–12.0)
Neutro Abs: 2.5 10*3/uL (ref 1.4–7.7)
Neutrophils Relative %: 56.6 % (ref 43.0–77.0)
PLATELETS: 293 10*3/uL (ref 150.0–400.0)
RBC: 3.37 Mil/uL — ABNORMAL LOW (ref 3.87–5.11)
RDW: 15.1 % (ref 11.5–15.5)
WBC: 4.4 10*3/uL (ref 4.0–10.5)

## 2014-11-16 LAB — HEPATIC FUNCTION PANEL
ALT: 7 U/L (ref 0–35)
AST: 10 U/L (ref 0–37)
Albumin: 3.7 g/dL (ref 3.5–5.2)
Alkaline Phosphatase: 57 U/L (ref 39–117)
BILIRUBIN TOTAL: 0.3 mg/dL (ref 0.2–1.2)
Bilirubin, Direct: 0.1 mg/dL (ref 0.0–0.3)
Total Protein: 7.6 g/dL (ref 6.0–8.3)

## 2014-11-16 LAB — BASIC METABOLIC PANEL
BUN: 14 mg/dL (ref 6–23)
CALCIUM: 8.8 mg/dL (ref 8.4–10.5)
CHLORIDE: 105 meq/L (ref 96–112)
CO2: 30 mEq/L (ref 19–32)
Creatinine, Ser: 0.65 mg/dL (ref 0.40–1.20)
GFR: 128.58 mL/min (ref >60.00)
Glucose, Bld: 83 mg/dL (ref 70–99)
Potassium: 3.9 mEq/L (ref 3.5–5.1)
SODIUM: 137 meq/L (ref 135–145)

## 2014-11-17 NOTE — Progress Notes (Signed)
Quick Note:  lmtcb for pt. ______ 

## 2014-11-24 ENCOUNTER — Telehealth: Payer: Self-pay | Admitting: Internal Medicine

## 2014-11-24 ENCOUNTER — Other Ambulatory Visit (INDEPENDENT_AMBULATORY_CARE_PROVIDER_SITE_OTHER): Payer: Managed Care, Other (non HMO)

## 2014-11-24 ENCOUNTER — Encounter: Payer: Self-pay | Admitting: Adult Health

## 2014-11-24 ENCOUNTER — Ambulatory Visit (INDEPENDENT_AMBULATORY_CARE_PROVIDER_SITE_OTHER): Payer: Managed Care, Other (non HMO) | Admitting: Adult Health

## 2014-11-24 VITALS — BP 126/78 | HR 76 | Temp 98.1°F | Ht 71.0 in | Wt 224.0 lb

## 2014-11-24 DIAGNOSIS — J849 Interstitial pulmonary disease, unspecified: Secondary | ICD-10-CM | POA: Diagnosis not present

## 2014-11-24 LAB — BASIC METABOLIC PANEL
BUN: 8 mg/dL (ref 6–23)
CHLORIDE: 103 meq/L (ref 96–112)
CO2: 29 meq/L (ref 19–32)
Calcium: 8.9 mg/dL (ref 8.4–10.5)
Creatinine, Ser: 0.7 mg/dL (ref 0.40–1.20)
GFR: 118.02 mL/min (ref >60.00)
Glucose, Bld: 91 mg/dL (ref 70–99)
Potassium: 3.2 mEq/L — ABNORMAL LOW (ref 3.5–5.1)
SODIUM: 136 meq/L (ref 135–145)

## 2014-11-24 LAB — CBC WITH DIFFERENTIAL/PLATELET
Basophils Absolute: 0 10*3/uL (ref 0.0–0.1)
Basophils Relative: 0.6 % (ref 0.0–3.0)
EOS ABS: 0 10*3/uL (ref 0.0–0.7)
Eosinophils Relative: 0.5 % (ref 0.0–5.0)
HCT: 29.4 % — ABNORMAL LOW (ref 36.0–46.0)
HEMOGLOBIN: 9.8 g/dL — AB (ref 12.0–15.0)
LYMPHS ABS: 1.4 10*3/uL (ref 0.7–4.0)
Lymphocytes Relative: 31.8 % (ref 12.0–46.0)
MCHC: 33.2 g/dL (ref 30.0–36.0)
MCV: 87.2 fl (ref 78.0–100.0)
Monocytes Absolute: 0.3 10*3/uL (ref 0.1–1.0)
Monocytes Relative: 6.9 % (ref 3.0–12.0)
NEUTROS PCT: 60.2 % (ref 43.0–77.0)
Neutro Abs: 2.7 10*3/uL (ref 1.4–7.7)
Platelets: 284 10*3/uL (ref 150.0–400.0)
RBC: 3.37 Mil/uL — ABNORMAL LOW (ref 3.87–5.11)
RDW: 15.4 % (ref 11.5–15.5)
WBC: 4.5 10*3/uL (ref 4.0–10.5)

## 2014-11-24 LAB — HEPATIC FUNCTION PANEL
ALK PHOS: 62 U/L (ref 39–117)
ALT: 10 U/L (ref 0–35)
AST: 15 U/L (ref 0–37)
Albumin: 3.7 g/dL (ref 3.5–5.2)
BILIRUBIN TOTAL: 0.3 mg/dL (ref 0.2–1.2)
Bilirubin, Direct: 0.1 mg/dL (ref 0.0–0.3)
TOTAL PROTEIN: 7.8 g/dL (ref 6.0–8.3)

## 2014-11-24 NOTE — Telephone Encounter (Signed)
[employer] ok - K 3.2. Have her take kcl x 5 days  Also, why is she getting weeekly labs? Who ordered it? (has mindy name on it) for how long is going to get this?  Thanks  Dr. Kalman Shan, M.D., [facility].C.P Pulmonary and Critical Care Medicine Staff Physician [place] [place] Pulmonary and Critical Care Pager: [phone], If no answer or between  15:00h - 7:00h: call [phone]  11/24/2014 2:32 PM

## 2014-11-24 NOTE — Progress Notes (Signed)
Quick Note:  Pt's results were reviewed at OV on 6/3. Will sign off. ______

## 2014-11-24 NOTE — Progress Notes (Signed)
Subjective:    Patient ID: Shannon [name], female    DOB: 07-Feb-1971, 44 y.o.   MRN: [id number] PCP [name],[name], PA-C Referred by Dr [name]  HPI   IOV 08/23/2014  Chief Complaint  Patient presents with  . Pulmonary Consult    Pt referred by Dr. Corliss Skains for ILD. Pt c/o SOB with activity and rest, dry cough and chest tightness also with and without activity.    44 year old female referred for evaluation of autoimmune interstitial lung disease. She presents with her husband. In 2010 while living in [place] she reports she was diagnosed with lupus associated with rheumatoid arthritis in her joints. In 2012 she moved to live in [place] and several months after that started noticing insidious onset of shortness of breath. Local rheumatologist diagnosed her with interstitial lung disease. She was referred to Dr. Herma Carson at [facility] in [facility] and was started on CellCept/prednisone for autoimmune interstitial lung disease he and however, sometime around 2 years ago she ran out of medical insurance and stopped taking these medications. During this time her dyspnea has progressed. It is currently rated as moderate to severe. It is present on exertion and relieved by rest. Even minimal amount of exertion makes her very dyspneic. Now she has insurance and she did see Dr. Frederik Pear locally and she has autoimmune panel lab ordered. In addition exam revealed crackles and there for she's been referred here for reevaluation of interstitial lung disease and dyspnea. Dyspnea is associated with some chest tightness but no chest pain. This no associated dizzine   11/24/2014 Follow UP ILD  [name]  She has autoimmune ILD with RA/Lupus  She was seen 6 weeks ago, restarted on Cellcept.  Previously on cellcept but lost her insurance until recently.  On low dose prednisone 5mg  daily .  Last CT chest 4/4 showed ILD changes similar to 2013. Echo was ok  with EFG 55-60%, nml PAP . In March .   Did not take dapsone ,due to  GP6D deficiency.  Labs ok last week with nml LFT , no sign change in hbg. /wbc.  She is feeling better. Does feel her breathing is some better.  No flare of cough or wheezing.     Review of Systems  Constitutional:   No  weight loss, night sweats,  Fevers, chills, fatigue, or  lassitude.  HEENT:   No headaches,  Difficulty swallowing,  Tooth/dental problems, or  Sore throat,                No sneezing, itching, ear ache, nasal congestion, post nasal drip,   CV:  No chest pain,  Orthopnea, PND, swelling in lower extremities, anasarca, dizziness, palpitations, syncope.   GI  No heartburn, indigestion, abdominal pain, nausea, vomiting, diarrhea, change in bowel habits, loss of appetite, bloody stools.   Resp: + shortness of breath with exertion , not at  rest.  No excess mucus, no productive cough,  No non-productive cough,  No coughing up of blood.  No change in color of mucus.  No wheezing.  No chest wall deformity  Skin: no rash or lesions.  GU: no dysuria, change in color of urine, no urgency or frequency.  No flank pain, no hematuria   MS:  No joint pain or swelling.  No decreased range of motion.  No back pain.  Psych:  No change in mood or affect. No depression or anxiety.  No memory loss.  Objective:   Physical Exam  Constitutional: She is oriented to person, place, and time. She appears well-developed and well-nourished. No distress.  Body mass index is 31.53 kg/(m^2).   HENT:  Head: Normocephalic and atraumatic.  Right Ear: External ear normal.  Left Ear: External ear normal.  Mouth/Throat: Oropharynx is clear and moist. No oropharyngeal exudate.  Eyes: Conjunctivae and EOM are normal. Pupils are equal, round, and reactive to light. Right eye exhibits no discharge. Left eye exhibits no discharge. No scleral icterus.  Neck: Normal range of motion. Neck supple. No JVD present. No tracheal  deviation present. No thyromegaly present.  Cardiovascular: Normal rate, regular rhythm, normal heart sounds and intact distal pulses.  Exam reveals no gallop and no friction rub.   No murmur heard. Pulmonary/Chest: Effort normal. No respiratory distress. She has no wheezes. She has rales. She exhibits no tenderness.  Crackles at base  Abdominal: Soft. Bowel sounds are normal. She exhibits no distension and no mass. There is no tenderness. There is no rebound and no guarding.  Musculoskeletal: Normal range of motion. She exhibits no edema or tenderness.  Lymphadenopathy:    She has no cervical adenopathy.  Neurological: She is alert and oriented to person, place, and time. She has normal reflexes. No cranial nerve deficit. She exhibits normal muscle tone. Coordination normal.  Skin: Skin is warm and dry. No rash noted. She is not diaphoretic. No erythema. No pallor.  tatoo  Psychiatric: She has a normal mood and affect. Her behavior is normal. Judgment and thought content normal.  Vitals reviewed.      Assessment & Plan:

## 2014-11-24 NOTE — Patient Instructions (Signed)
Stay on current prednisone Continue Cellcept  1000mg  in am and pm  Labs today  Recheck labs in 2 weeks and As needed   Follow up Dr. in 4 weeks and As needed

## 2014-11-29 ENCOUNTER — Other Ambulatory Visit: Payer: Self-pay | Admitting: Emergency Medicine

## 2014-11-29 MED ORDER — POTASSIUM CHLORIDE CRYS ER 20 MEQ PO TBCR: 20.0000 meq | EXTENDED_RELEASE_TABLET | Freq: Every day | ORAL | Status: DC

## 2014-11-29 NOTE — Telephone Encounter (Signed)
Pt returned call. Call back after 3:00  -[phone]

## 2014-11-29 NOTE — Telephone Encounter (Signed)
Called and spoke to pt. Informed her of the results and recs per MR. Rx sent to preferred pharmacy Pt verbalized understanding and denied any further questions or concerns at this time.   MR--- TP wanted her to have labs the day of her OV on 6/3.

## 2014-11-29 NOTE — Telephone Encounter (Signed)
LMTCB for pt 

## 2014-12-02 NOTE — Assessment & Plan Note (Signed)
Autoimmune ILD with RA and Lupus  Previously treated with cellcept and low dose prednisone Will need to look at prophlaxis as unable to take dapsone.    Plan  Stay on current prednisone Continue Cellcept  1000mg  in am and pm  Labs today  Recheck labs in 2 weeks and As needed   Follow up Dr. in 4 weeks and As needed

## 2014-12-08 ENCOUNTER — Other Ambulatory Visit (INDEPENDENT_AMBULATORY_CARE_PROVIDER_SITE_OTHER): Payer: Managed Care, Other (non HMO)

## 2014-12-08 DIAGNOSIS — J849 Interstitial pulmonary disease, unspecified: Secondary | ICD-10-CM

## 2014-12-08 LAB — CBC WITH DIFFERENTIAL/PLATELET
Basophils Absolute: 0 10*3/uL (ref 0.0–0.1)
Basophils Relative: 0.5 % (ref 0.0–3.0)
EOS ABS: 0.1 10*3/uL (ref 0.0–0.7)
Eosinophils Relative: 2.2 % (ref 0.0–5.0)
HEMATOCRIT: 29 % — AB (ref 36.0–46.0)
Hemoglobin: 9.4 g/dL — ABNORMAL LOW (ref 12.0–15.0)
Lymphocytes Relative: 33.6 % (ref 12.0–46.0)
Lymphs Abs: 1.3 10*3/uL (ref 0.7–4.0)
MCHC: 32.2 g/dL (ref 30.0–36.0)
MCV: 88 fl (ref 78.0–100.0)
MONOS PCT: 7.9 % (ref 3.0–12.0)
Monocytes Absolute: 0.3 10*3/uL (ref 0.1–1.0)
NEUTROS ABS: 2.1 10*3/uL (ref 1.4–7.7)
Neutrophils Relative %: 55.8 % (ref 43.0–77.0)
Platelets: 238 10*3/uL (ref 150.0–400.0)
RBC: 3.3 Mil/uL — AB (ref 3.87–5.11)
RDW: 15.4 % (ref 11.5–15.5)
WBC: 3.7 10*3/uL — AB (ref 4.0–10.5)

## 2014-12-08 LAB — BASIC METABOLIC PANEL
BUN: 14 mg/dL (ref 6–23)
CALCIUM: 8.9 mg/dL (ref 8.4–10.5)
CHLORIDE: 105 meq/L (ref 96–112)
CO2: 26 mEq/L (ref 19–32)
CREATININE: 0.65 mg/dL (ref 0.40–1.20)
GFR: 128.54 mL/min (ref >60.00)
Glucose, Bld: 103 mg/dL — ABNORMAL HIGH (ref 70–99)
Potassium: 3 mEq/L — ABNORMAL LOW (ref 3.5–5.1)
Sodium: 136 mEq/L (ref 135–145)

## 2014-12-08 LAB — HEPATIC FUNCTION PANEL
ALK PHOS: 57 U/L (ref 39–117)
ALT: 10 U/L (ref 0–35)
AST: 11 U/L (ref 0–37)
Albumin: 3.5 g/dL (ref 3.5–5.2)
BILIRUBIN DIRECT: 0.1 mg/dL (ref 0.0–0.3)
BILIRUBIN TOTAL: 0.3 mg/dL (ref 0.2–1.2)
Total Protein: 7.1 g/dL (ref 6.0–8.3)

## 2014-12-09 ENCOUNTER — Other Ambulatory Visit: Payer: Self-pay | Admitting: Internal Medicine

## 2014-12-09 DIAGNOSIS — J849 Interstitial pulmonary disease, unspecified: Secondary | ICD-10-CM

## 2014-12-10 NOTE — Telephone Encounter (Signed)
Shannon [name] \ When is her net scheduled lab draw becase 12/08/14 also K is low at .When is next lab draw? At this next lab draw we need to check Mag and phos along with bmet. Let me know  Thanks  Dr. Kalman Shan, M.D., [facility].C.P Pulmonary and Critical Care Medicine Staff Physician [facility] [place] Pulmonary and Critical Care Pager: [phone], If no answer or between  15:00h - 7:00h: call [phone]  12/10/2014 8:55 AM

## 2014-12-11 MED ORDER — MYCOPHENOLATE MOFETIL 500 MG PO TABS: 1000.0000 mg | ORAL_TABLET | Freq: Two times a day (BID) | ORAL | Status: DC

## 2014-12-11 NOTE — Telephone Encounter (Signed)
Request from pharmacy for refill of Cellcept 500mg  -  Per last OV 11/24/14 pt is to continue taking Cellcept as directed.  Patient Instructions     Stay on current prednisone Continue Cellcept 1000mg  in am and pm  Labs today  Recheck labs in 2 weeks and As needed  Follow up Dr. 01/24/15 in 4 weeks and As needed     Refill sent to pharmacy.  Nothing further needed.

## 2014-12-12 ENCOUNTER — Telehealth: Payer: Self-pay | Admitting: Internal Medicine

## 2014-12-12 NOTE — Telephone Encounter (Signed)
Per TP pt is to have a CBC/diff recheck in 4 weeks with OV on 7/26 with MR. LMTCB for pt to inform her.   Notes Recorded by Julio Sicks, NP on 12/11/2014 at 2:23 PM K+ remains low.  Make sure she is taking K+ , if so increased to Twice daily And  Need follow up with PCP  LFT ok  WBC low normal will need to be recehceck on return ov in 4 weeks  Please contact office for sooner follow up if symptoms do not improve or worsen or seek emergency care

## 2014-12-12 NOTE — Telephone Encounter (Signed)
Notes Recorded by Tommie Sams, CMA on 12/11/2014 at 3:13 PM Called mobile # NA and VM not set up yet Called home # and LMTCB x1 w/ family member Notes Recorded by Julio Sicks, NP on 12/11/2014 at 2:23 PM K+ remains low.  Make sure she is taking K+ , if so increased to Twice daily And  Need follow up with PCP  LFT ok  WBC low normal will need to be recehceck on return ov in 4 weeks  Please contact office for sooner follow up if symptoms do not improve or worsen or seek emergency care     [facility]

## 2014-12-13 MED ORDER — POTASSIUM CHLORIDE CRYS ER 20 MEQ PO TBCR: 20.0000 meq | EXTENDED_RELEASE_TABLET | Freq: Every day | ORAL | Status: DC

## 2014-12-13 NOTE — Telephone Encounter (Signed)
Rx has been sent in. Pt is aware of TP's recommendations. Nothing further was needed.

## 2014-12-13 NOTE — Telephone Encounter (Signed)
Spoke with pt. She is aware of her lab results. States that she was only given 5 tablets of K+ to be taken for 5 days. Wants to know if she will need more of this, if so she will need a prescription.  TP - please advise. Thanks.

## 2014-12-13 NOTE — Telephone Encounter (Signed)
Pt returned call - [phone]

## 2014-12-13 NOTE — Telephone Encounter (Signed)
Yes please send KDur daily #30 , no refills  Will need ov with pcp to follow after this.

## 2014-12-22 ENCOUNTER — Other Ambulatory Visit: Payer: Managed Care, Other (non HMO)

## 2015-01-16 ENCOUNTER — Ambulatory Visit (INDEPENDENT_AMBULATORY_CARE_PROVIDER_SITE_OTHER): Payer: Managed Care, Other (non HMO) | Admitting: Internal Medicine

## 2015-01-16 ENCOUNTER — Encounter: Payer: Self-pay | Admitting: Internal Medicine

## 2015-01-16 ENCOUNTER — Other Ambulatory Visit (INDEPENDENT_AMBULATORY_CARE_PROVIDER_SITE_OTHER): Payer: Managed Care, Other (non HMO)

## 2015-01-16 VITALS — BP 148/98 | HR 82 | Ht 71.0 in | Wt 223.0 lb

## 2015-01-16 DIAGNOSIS — Z79899 Other long term (current) drug therapy: Secondary | ICD-10-CM | POA: Insufficient documentation

## 2015-01-16 DIAGNOSIS — R0600 Dyspnea, unspecified: Secondary | ICD-10-CM

## 2015-01-16 DIAGNOSIS — J849 Interstitial pulmonary disease, unspecified: Secondary | ICD-10-CM

## 2015-01-16 DIAGNOSIS — M069 Rheumatoid arthritis, unspecified: Secondary | ICD-10-CM

## 2015-01-16 LAB — PHOSPHORUS: PHOSPHORUS: 2.7 mg/dL (ref 2.3–4.6)

## 2015-01-16 LAB — CBC WITH DIFFERENTIAL/PLATELET
Basophils Absolute: 0 10*3/uL (ref 0.0–0.1)
Basophils Relative: 0.2 % (ref 0.0–3.0)
EOS ABS: 0.1 10*3/uL (ref 0.0–0.7)
EOS PCT: 1.6 % (ref 0.0–5.0)
HCT: 30.8 % — ABNORMAL LOW (ref 36.0–46.0)
Hemoglobin: 10.1 g/dL — ABNORMAL LOW (ref 12.0–15.0)
LYMPHS PCT: 33.9 % (ref 12.0–46.0)
Lymphs Abs: 1.5 10*3/uL (ref 0.7–4.0)
MCHC: 32.7 g/dL (ref 30.0–36.0)
MCV: 88.1 fl (ref 78.0–100.0)
MONOS PCT: 8.1 % (ref 3.0–12.0)
Monocytes Absolute: 0.4 10*3/uL (ref 0.1–1.0)
NEUTROS PCT: 56.2 % (ref 43.0–77.0)
Neutro Abs: 2.6 10*3/uL (ref 1.4–7.7)
Platelets: 281 10*3/uL (ref 150.0–400.0)
RBC: 3.5 Mil/uL — ABNORMAL LOW (ref 3.87–5.11)
RDW: 15.6 % — ABNORMAL HIGH (ref 11.5–15.5)
WBC: 4.6 10*3/uL (ref 4.0–10.5)

## 2015-01-16 LAB — HEPATIC FUNCTION PANEL
ALT: 8 U/L (ref 0–35)
AST: 10 U/L (ref 0–37)
Albumin: 3.5 g/dL (ref 3.5–5.2)
Alkaline Phosphatase: 61 U/L (ref 39–117)
Bilirubin, Direct: 0.1 mg/dL (ref 0.0–0.3)
Total Bilirubin: 0.2 mg/dL (ref 0.2–1.2)
Total Protein: 7.5 g/dL (ref 6.0–8.3)

## 2015-01-16 LAB — BASIC METABOLIC PANEL
BUN: 7 mg/dL (ref 6–23)
CALCIUM: 8.9 mg/dL (ref 8.4–10.5)
CO2: 28 mEq/L (ref 19–32)
CREATININE: 0.69 mg/dL (ref 0.40–1.20)
Chloride: 105 mEq/L (ref 96–112)
GFR: 119.92 mL/min (ref >60.00)
GLUCOSE: 82 mg/dL (ref 70–99)
Potassium: 3.9 mEq/L (ref 3.5–5.1)
Sodium: 138 mEq/L (ref 135–145)

## 2015-01-16 LAB — MAGNESIUM: MAGNESIUM: 2.1 mg/dL (ref 1.5–2.5)

## 2015-01-16 MED ORDER — MYCOPHENOLATE MOFETIL 500 MG PO TABS: 1000.0000 mg | ORAL_TABLET | Freq: Two times a day (BID) | ORAL | Status: DC

## 2015-01-16 NOTE — Progress Notes (Signed)
Subjective:    Patient ID: Shannon [name], female    DOB: [birth year], 44 y.o.   MRN: [id number]  HPI   PCP [name],[name], PA-C Referred by Dr [name]  HPI   IOV 08/23/2014  Chief Complaint  Patient presents with  . Pulmonary Consult    Pt referred by Dr. Corliss Skains for ILD. Pt c/o SOB with activity and rest, dry cough and chest tightness also with and without activity.    44 year old female referred for evaluation of autoimmune interstitial lung disease. She presents with her husband. In 2010 while living in [place] she reports she was diagnosed with lupus associated with rheumatoid arthritis in her joints. In 2012 she moved to live in [facility] and several months after that started noticing insidious onset of shortness of breath. Local rheumatologist diagnosed her with interstitial lung disease. She was referred to Dr. Herma Carson at [facility] in [facility] and was started on CellCept/prednisone for autoimmune interstitial lung disease he and however, sometime around 2 years ago she ran out of medical insurance and stopped taking these medications. During this time her dyspnea has progressed. It is currently rated as moderate to severe. It is present on exertion and relieved by rest. Even minimal amount of exertion makes her very dyspneic. Now she has insurance and she did see Dr. Frederik Pear locally and she has autoimmune panel lab ordered. In addition exam revealed crackles and there for she's been referred here for reevaluation of interstitial lung disease and dyspnea. Dyspnea is associated with some chest tightness but no chest pain. This no associated dizzine   11/24/2014 Follow UP ILD  [name]  She has autoimmune ILD with RA/Lupus  She was seen 6 weeks ago, restarted on Cellcept.  Previously on cellcept but lost her insurance until recently.  On low dose prednisone 5mg  daily .  Last CT chest 4/4 showed ILD changes similar to  2013. Echo was ok with EFG 55-60%, nml PAP . In March .   Did not take dapsone ,due to  GP6D deficiency.  Labs ok last week with nml LFT , no sign change in hbg. /wbc.  She is feeling better. Does feel her breathing is some better.  No flare of cough or wheezing.     OV 01/16/2015  Chief Complaint  Patient presents with  . Follow-up    Pt c/o DOE, mild dry cough, and chest tightness when SOB. Pt states the chest tightness has improved).    follow-up interstitial lung disease in the setting of rheumatoid arthritis Follow-up high risk medication use - on CellCept and prednisone since mid April 2016  - Presents with her husband. Both give a history. Overall she is doing better in terms of dyspnea after starting CellCept and prednisone. However the improvement this only moderate. She still left with a residual moderate dyspnea on exertion that is also made worse with bending or heart air and improved with rest and cool air. It is associated with some cough and wheezing. She takes albuterol inhaler which she feels helps only somewhat but not fully and not quickly enough. She is frustrated by this. In addition she's complaining of some associated right lower back paraspinal spasm for which massage gives her relief. He has never attended pulmonary rehabilitation.  - Lab review shows she has had problems with potassium and has had potassium supplementation. Last lab check was 12/08/2014. She is due for lab test right now  Current outpatient prescriptions:  .  albuterol (PROVENTIL HFA;VENTOLIN HFA) 108 (90 BASE) MCG/ACT inhaler, Inhale 2 puffs into the lungs every 6 (six) hours as needed for wheezing or shortness of breath., Disp: , Rfl:  .  ibuprofen (ADVIL,MOTRIN) 200 MG tablet, Take 200 mg by mouth every 6 (six) hours as needed., Disp: , Rfl:  .  mycophenolate (CELLCEPT) 500 MG tablet, Take 2 tablets (1,000 mg total) by mouth 2 (two) times daily., Disp: 120 tablet, Rfl: 0 .  oxycodone  (OXY-IR) 5 MG capsule, Take 5 mg by mouth 2 (two) times daily., Disp: , Rfl:  .  potassium chloride SA (K-DUR,KLOR-CON) 20 MEQ tablet, Take 1 tablet (20 mEq total) by mouth daily., Disp: 30 tablet, Rfl: 0 .  predniSONE (DELTASONE) 10 MG tablet, Take 5 mg by mouth daily with breakfast. , Disp: , Rfl:    Review of Systems  Constitutional: Negative for fever and unexpected weight change.  HENT: Negative for congestion, dental problem, ear pain, nosebleeds, postnasal drip, rhinorrhea, sinus pressure, sneezing, sore throat and trouble swallowing.   Eyes: Negative for redness and itching.  Respiratory: Positive for cough, chest tightness and shortness of breath. Negative for wheezing.   Cardiovascular: Negative for palpitations and leg swelling.  Gastrointestinal: Negative for nausea and vomiting.  Genitourinary: Negative for dysuria.  Musculoskeletal: Positive for joint swelling.  Skin: Negative for rash.  Neurological: Negative for headaches.  Hematological: Does not bruise/bleed easily.  Psychiatric/Behavioral: Negative for dysphoric mood. The patient is not nervous/anxious.        Objective:   Physical Exam  Constitutional: She is oriented to person, place, and time. She appears well-developed and well-nourished. No distress.  HENT:  Head: Normocephalic and atraumatic.  Right Ear: External ear normal.  Left Ear: External ear normal.  Mouth/Throat: Oropharynx is clear and moist. No oropharyngeal exudate.  Eyes: Conjunctivae and EOM are normal. Pupils are equal, round, and reactive to light. Right eye exhibits no discharge. Left eye exhibits no discharge. No scleral icterus.  Neck: Normal range of motion. Neck supple. No JVD present. No tracheal deviation present. No thyromegaly present.  Cardiovascular: Normal rate, regular rhythm, normal heart sounds and intact distal pulses.  Exam reveals no gallop and no friction rub.   No murmur heard. Pulmonary/Chest: Effort normal. No respiratory  distress. She has no wheezes. She has rales. She exhibits no tenderness.  Abdominal: Soft. Bowel sounds are normal. She exhibits no distension and no mass. There is no tenderness. There is no rebound and no guarding.  Musculoskeletal: Normal range of motion. She exhibits no edema or tenderness.  No deformity in spinr. ? Rt mid back paraspinal spasm +  Lymphadenopathy:    She has no cervical adenopathy.  Neurological: She is alert and oriented to person, place, and time. She has normal reflexes. No cranial nerve deficit. She exhibits normal muscle tone. Coordination normal.  Skin: Skin is warm and dry. No rash noted. She is not diaphoretic. No erythema. No pallor.  Psychiatric: She has a normal mood and affect. Her behavior is normal. Judgment and thought content normal.  Vitals reviewed.   Filed Vitals:   01/16/15 0926  BP: 148/98  Pulse: 82  Height: 5\' 11"  (1.803 m)  Weight: 223 lb (101.152 kg)  SpO2: 96%          Assessment & Plan:  ILD (interstitial lung disease) Rheumatoid arthritis   - disease appears stable - - continue cellcept 1000mg  bid and prednisone daily as before  - repeat  full PFT in 3 months - refer pulmonary rehab  High risk medication use  - do CBCD, Bmet, mag, phos, LFT 01/16/2015   Dyspnea/ SHortness of breath  - likely due to ILD but possible you have associated airway disease  - try advair 100/50 1 puff twice daily for 2 months   > 50% of this > 25 min visit spent in face to face counseling or coordination of care    Followup  - will call with blood reuslts  - pft in 3 months  -0 return in 3 months after PFT   Dr. Kalman Shan, M.D., [facility].C.P Pulmonary and Critical Care Medicine Staff Physician [facility] Pager: [phone], If no answer or between  15:00h - 7:00h: call [phone]  01/16/2015 10:01 AM

## 2015-01-16 NOTE — Patient Instructions (Addendum)
ILD (interstitial lung disease) due to Rheumatoid arthritis   - disease appears stable - continue cellcept 1000mg  bid and prednisone daily as before  - repeat full PFT in 3 months - refer pulmonary rehab - consider increase cellcept in future depending on fu PFT and symptoms and side effects  High risk medication use  - do CBCD, Bmet, mag, phos, LFT 01/16/2015   Dyspnea/ SHortness of breath  - likely due to ILD but possible you might  have associated airway disease  - try advair 100/50 1 puff twice daily for 2 months  Followup  - will call with blood reuslts  - pft in 3 months  -0 return in 3 months after PFT

## 2015-01-18 ENCOUNTER — Telehealth: Payer: Self-pay | Admitting: Internal Medicine

## 2015-01-18 NOTE — Telephone Encounter (Signed)
Called and spoke to pt's husband. Informed him of the results per MR. Pt verbalized understanding and denied any further questions or concerns at this time.   Notes Recorded by Kalman Shan, MD on 01/16/2015 at 4:46 PM Labsnormal

## 2015-01-18 NOTE — Progress Notes (Signed)
Quick Note:  Called and spoke to pt's husband. Informed him of the results per MR. Pt verbalized understanding and denied any further questions or concerns at this time. ______

## 2015-01-29 ENCOUNTER — Telehealth (HOSPITAL_COMMUNITY): Payer: Self-pay

## 2015-01-29 NOTE — Telephone Encounter (Signed)
I have called and left a message with [facility] to inquire about participation in Pulmonary Rehab per Dr. Jane Canary referral. Will send letter in mail and follow up.

## 2015-01-30 ENCOUNTER — Telehealth (HOSPITAL_COMMUNITY): Payer: Self-pay

## 2015-01-30 NOTE — Telephone Encounter (Signed)
Called patient regarding entrance to Pulmonary Rehab.  Patient states that they are interested in attending the program.  Shannon [name] is going to verify insurance coverage and follow up.

## 2015-02-06 ENCOUNTER — Telehealth (HOSPITAL_COMMUNITY): Payer: Self-pay

## 2015-02-06 NOTE — Telephone Encounter (Signed)
I have called and left a message with [facility] to inquire about participation in Pulmonary Rehab per Dr. Ramaswamy's referral. Will send letter in mail and follow up.  

## 2015-02-08 ENCOUNTER — Telehealth: Payer: Self-pay | Admitting: Internal Medicine

## 2015-02-08 DIAGNOSIS — J849 Interstitial pulmonary disease, unspecified: Secondary | ICD-10-CM

## 2015-02-08 NOTE — Telephone Encounter (Signed)
I called spoke with pt. She reports she has been on the cellcept for about 3-4 months now. Venetia Night he past 3 days she has been vomiting after she takes her cellcept and the pills come right back up when she does. She is taking these on an empty stomach bc she can't eat when she takes these either. Her chest  Is tight from this as well. Pt using ehr advair and albuterol inhaler. Please advise MR thanks  Allergies  Allergen Reactions  . Asa [Aspirin]     HIves  . Sulfa Antibiotics     G6PD deficiency      Current Outpatient Prescriptions on File Prior to Visit  Medication Sig Dispense Refill  . albuterol (PROVENTIL HFA;VENTOLIN HFA) 108 (90 BASE) MCG/ACT inhaler Inhale 2 puffs into the lungs every 6 (six) hours as needed for wheezing or shortness of breath.    Marland Kitchen ibuprofen (ADVIL,MOTRIN) 200 MG tablet Take 200 mg by mouth every 6 (six) hours as needed.    . mycophenolate (CELLCEPT) 500 MG tablet Take 2 tablets (1,000 mg total) by mouth 2 (two) times daily. 120 tablet 5  . oxycodone (OXY-IR) 5 MG capsule Take 5 mg by mouth 2 (two) times daily.    . potassium chloride SA (K-DUR,KLOR-CON) 20 MEQ tablet Take 1 tablet (20 mEq total) by mouth daily. 30 tablet 0  . predniSONE (DELTASONE) 10 MG tablet Take 5 mg by mouth daily with breakfast.      No current facility-administered medications on file prior to visit.

## 2015-02-08 NOTE — Telephone Encounter (Signed)
lmtcb x1 for pt. 

## 2015-02-08 NOTE — Telephone Encounter (Signed)
Please check with her if she doing the following correctly and let me know  CELLCEPT  -  - Oral dosage formulations (tablet, capsule, suspension) should be administered on an empty stomach (1 hour before or 2 hours after meals) to avoid variability in MPA absorption.  - should not be mixed with other medications.  - Delayed release tablets should not be crushed, cut, or chewed.  -- If a dose is missed, administer as soon as it is remembered.  - If it is close to the next scheduled dose, skip the missed dose and resume at next regularly scheduled time; do not double a dose to make up for a missed dos

## 2015-02-09 NOTE — Telephone Encounter (Signed)
LMTCB x2  

## 2015-02-12 ENCOUNTER — Telehealth (HOSPITAL_COMMUNITY): Payer: Self-pay

## 2015-02-12 NOTE — Telephone Encounter (Signed)
Called and spoke to pt. Reiterated the info about the blood work. Order placed. Pt verbalized understanding and denied any further questions or concerns at this time.

## 2015-02-12 NOTE — Telephone Encounter (Signed)
Patient called to discuss Pulmonary Rehab referral.  Patient states that she called insurance and was unable to gather information.  I will contact insurance company and follow up with patient.

## 2015-02-12 NOTE — Telephone Encounter (Signed)
Patient says that she is feeling a lot better now, she said she thinks she might have been coming down with a "bug". However, she says that she has been taking the CellCept in the morning at the same time as Prednisone and in the evening with her Oxycodone.  She said that she will stop taking it with other medications.  FYI to Dr. Marchelle Gearing

## 2015-02-12 NOTE — Telephone Encounter (Signed)
I spoke to patient and she is fine now. TOld her Ok to restart cellcept. ADvised she do BMET this week - can you order that please

## 2015-02-22 ENCOUNTER — Other Ambulatory Visit (INDEPENDENT_AMBULATORY_CARE_PROVIDER_SITE_OTHER): Payer: Managed Care, Other (non HMO)

## 2015-02-22 DIAGNOSIS — J849 Interstitial pulmonary disease, unspecified: Secondary | ICD-10-CM

## 2015-02-22 LAB — BASIC METABOLIC PANEL
BUN: 9 mg/dL (ref 6–23)
CALCIUM: 8.9 mg/dL (ref 8.4–10.5)
CO2: 25 mEq/L (ref 19–32)
Chloride: 106 mEq/L (ref 96–112)
Creatinine, Ser: 0.68 mg/dL (ref 0.40–1.20)
GFR: 121.9 mL/min (ref >60.00)
Glucose, Bld: 87 mg/dL (ref 70–99)
POTASSIUM: 4 meq/L (ref 3.5–5.1)
Sodium: 137 mEq/L (ref 135–145)

## 2015-02-23 ENCOUNTER — Telehealth: Payer: Self-pay | Admitting: Internal Medicine

## 2015-02-23 NOTE — Telephone Encounter (Signed)
  Let Henna Derderian know that BMET is normal  Recent Labs Lab 02/22/15 0847  NA 137  K 4.0  CL 106  CO2 25  GLUCOSE 87  BUN 9  CREATININE 0.68  CALCIUM 8.9

## 2015-02-23 NOTE — Telephone Encounter (Signed)
lmtcb X1 for pt with pt's spouse.   

## 2015-02-27 ENCOUNTER — Telehealth: Payer: Self-pay | Admitting: Internal Medicine

## 2015-02-27 NOTE — Telephone Encounter (Signed)
Let Shannon [name] know that BMET is normal  Last Labs        ----------------  lmtcb X1 for pt.

## 2015-02-27 NOTE — Telephone Encounter (Signed)
Spoke with pt, .aware of results.  

## 2015-02-28 NOTE — Telephone Encounter (Signed)
lmtcb for pt.  

## 2015-03-01 NOTE — Telephone Encounter (Signed)
LM with family member for pt to return call.  He stated she would be at work until 03/02/15 But would have her to return our call when she came home

## 2015-03-01 NOTE — Telephone Encounter (Signed)
Pt will call back

## 2015-03-02 NOTE — Telephone Encounter (Signed)
Patient returned call, can be reached at [phone] until 2:30 pm.

## 2015-03-02 NOTE — Telephone Encounter (Signed)
I spoke with patient about results and she verbalized understanding and had no questions 

## 2015-03-28 ENCOUNTER — Encounter: Payer: Self-pay | Admitting: Internal Medicine

## 2015-03-28 ENCOUNTER — Ambulatory Visit (INDEPENDENT_AMBULATORY_CARE_PROVIDER_SITE_OTHER): Payer: Managed Care, Other (non HMO) | Admitting: Internal Medicine

## 2015-03-28 VITALS — BP 138/88 | HR 84 | Ht 71.0 in | Wt 217.6 lb

## 2015-03-28 DIAGNOSIS — J849 Interstitial pulmonary disease, unspecified: Secondary | ICD-10-CM

## 2015-03-28 DIAGNOSIS — R0600 Dyspnea, unspecified: Secondary | ICD-10-CM

## 2015-03-28 LAB — PULMONARY FUNCTION TEST
DL/VA % PRED: 71 %
DL/VA: 3.96 ml/min/mmHg/L
DLCO UNC % PRED: 42 %
DLCO unc: 14.5 ml/min/mmHg
FEF 25-75 PRE: 2.22 L/s
FEF 25-75 Post: 3.39 L/sec
FEF2575-%Change-Post: 52 %
FEF2575-%Pred-Post: 102 %
FEF2575-%Pred-Pre: 67 %
FEV1-%CHANGE-POST: 8 %
FEV1-%Pred-Post: 70 %
FEV1-%Pred-Pre: 65 %
FEV1-Post: 2.26 L
FEV1-Pre: 2.09 L
FEV1FVC-%CHANGE-POST: 5 %
FEV1FVC-%PRED-PRE: 98 %
FEV6-%Change-Post: 1 %
FEV6-%PRED-POST: 67 %
FEV6-%Pred-Pre: 66 %
FEV6-Post: 2.59 L
FEV6-Pre: 2.55 L
FEV6FVC-%Pred-Post: 102 %
FEV6FVC-%Pred-Pre: 102 %
FVC-%CHANGE-POST: 2 %
FVC-%Pred-Post: 66 %
FVC-%Pred-Pre: 64 %
FVC-Post: 2.61 L
FVC-Pre: 2.55 L
POST FEV1/FVC RATIO: 87 %
PRE FEV6/FVC RATIO: 100 %
Post FEV6/FVC ratio: 100 %
Pre FEV1/FVC ratio: 82 %
RV % pred: 72 %
RV: 1.46 L
TLC % pred: 65 %
TLC: 4.02 L

## 2015-03-28 NOTE — Progress Notes (Signed)
PFT done today. 

## 2015-03-28 NOTE — Addendum Note (Signed)
Addended by: Nicanor Alcon on: 03/28/2015 02:03 PM   Modules accepted: Orders

## 2015-03-28 NOTE — Progress Notes (Signed)
Subjective:    Patient ID: Shannon [name], female    DOB: 09-Jun-1971, 44 y.o.   MRN: [id number]  HPI    PCP [name],[name], PA-C Referred by Dr [name]  HPI   IOV 08/23/2014  Chief Complaint  Patient presents with  . Pulmonary Consult    Pt referred by Dr. Corliss Skains for ILD. Pt c/o SOB with activity and rest, dry cough and chest tightness also with and without activity.    44 year old female referred for evaluation of autoimmune interstitial lung disease. She presents with her husband. In 2010 while living in [place] she reports she was diagnosed with lupus associated with rheumatoid arthritis in her joints. In 2012 she moved to live in [facility] and several months after that started noticing insidious onset of shortness of breath. Local rheumatologist diagnosed her with interstitial lung disease. She was referred to Dr. Herma Carson at [facility] in [facility] and was started on CellCept/prednisone for autoimmune interstitial lung disease he and however, sometime around 2 years ago she ran out of medical insurance and stopped taking these medications. During this time her dyspnea has progressed. It is currently rated as moderate to severe. It is present on exertion and relieved by rest. Even minimal amount of exertion makes her very dyspneic. Now she has insurance and she did see Dr. Frederik Pear locally and she has autoimmune panel lab ordered. In addition exam revealed crackles and there for she's been referred here for reevaluation of interstitial lung disease and dyspnea. Dyspnea is associated with some chest tightness but no chest pain. This no associated dizzine   11/24/2014 Follow UP ILD  [name]  She has autoimmune ILD with RA/Lupus  She was seen 6 weeks ago, restarted on Cellcept.  Previously on cellcept but lost her insurance until recently.  On low dose prednisone 5mg  daily .  Last CT chest 4/4 showed ILD changes similar to  2013. Echo was ok with EFG 55-60%, nml PAP . In March .   Did not take dapsone ,due to  GP6D deficiency.  Labs ok last week with nml LFT , no sign change in hbg. /wbc.  She is feeling better. Does feel her breathing is some better.  No flare of cough or wheezing.     OV 01/16/2015  Chief Complaint  Patient presents with  . Follow-up    Pt c/o DOE, mild dry cough, and chest tightness when SOB. Pt states the chest tightness has improved).    follow-up interstitial lung disease in the setting of rheumatoid arthritis Follow-up high risk medication use - on CellCept and prednisone since mid April 2016  - Presents with her husband. Both give a history. Overall she is doing better in terms of dyspnea after starting CellCept and prednisone. However the improvement this only moderate. She still left with a residual moderate dyspnea on exertion that is also made worse with bending or heart air and improved with rest and cool air. It is associated with some cough and wheezing. She takes albuterol inhaler which she feels helps only somewhat but not fully and not quickly enough. She is frustrated by this. In addition she's complaining of some associated right lower back paraspinal spasm for which massage gives her relief. He has never attended pulmonary rehabilitation.  - Lab review shows she has had problems with potassium and has had potassium supplementation. Last lab check was 12/08/2014. She is due for lab test right now   OV  03/28/2015   Chief Complaint  Patient presents with  . Follow-up    3 month follow up. Pt states that she is still having some problems with her breathing. Pt c/o of feeling chest tightness, chest pain and cough that is dry. Pt denies wheeze. Pt states that she did trial the Advair and does feel that it helped some.   Follow-up interstitial lung disease secondary to autoimmune process and associated dyspnea that seems out of proportion\  She presents with her husband.  She continues on CellCept and prednisone. In the last visit approximately 2-3 months ago she had out of proportion dyspnea. I gave her some Advair to try. She says this only helped a little bit. Overall she says that dyspnea still persists. It is worse than in the spring 2016 when she started CellCept and prednisone. It is stable since July 2016. It is moderate in intensity. Occurs randomly at rest but also with exertion. Occasionally relieved by rest but also happens at rest. Heat and humidity make it worse. Advair helped a little bit only. This no associated chest tightness or wheezing. She did try and enroll  in pulmonary rehabilitation but could not afford the the startup program and is waiting to hear from them for the maintenance program. She is frustrated by all the symptoms.  Pulmonary function test October 15,016 today FVC 2.55 L/64%, total lung capacity 4 L/65% and diffusion 14.5/42%. Overall no change since April 2016    Current outpatient prescriptions:  .  albuterol (PROVENTIL HFA;VENTOLIN HFA) 108 (90 BASE) MCG/ACT inhaler, Inhale 2 puffs into the lungs every 6 (six) hours as needed for wheezing or shortness of breath., Disp: , Rfl:  .  ibuprofen (ADVIL,MOTRIN) 200 MG tablet, Take 200 mg by mouth every 6 (six) hours as needed., Disp: , Rfl:  .  mycophenolate (CELLCEPT) 500 MG tablet, Take 2 tablets (1,000 mg total) by mouth 2 (two) times daily., Disp: 120 tablet, Rfl: 5 .  oxycodone (OXY-IR) 5 MG capsule, Take 5 mg by mouth 2 (two) times daily., Disp: , Rfl:  .  predniSONE (DELTASONE) 10 MG tablet, Take 5 mg by mouth daily with breakfast. , Disp: , Rfl:  .  potassium chloride SA (K-DUR,KLOR-CON) 20 MEQ tablet, Take 1 tablet (20 mEq total) by mouth daily. (Patient not taking: Reported on 03/28/2015), Disp: 30 tablet, Rfl: 0  Immunization History  Administered Date(s) Administered  . Influenza Split 03/23/2014  . Pneumococcal-Unspecified 08/23/2014      Review of Systems     Objective:   Physical Exam  Filed Vitals:   03/28/15 1333  BP: 138/88  Pulse: 84  SpO2: 92%    discussion visit mainly. Focal exam shows persistent bilateral crackles. No thrush       Assessment & Plan:     ICD-9-CM ICD-10-CM   1. ILD (interstitial lung disease) (HCC) 515 J84.9   2. Dyspnea 786.09 R06.00     out of proportion dyspnea could just be related to interstitial lung disease and being overweight and diastolic dysfunction and physical deconditioning. I strongly encouraged pulmonary rehabilitation and she will try to get into the maintenance program. Independent of this I offered the option of increasing the CellCept but she does not want to do it. She still is interested in knowing if she has other reasons for dyspnea. Therefore we will get a pulmonary stress test on the bike and see her for follow-up. We'll get blood work because she is on CellCept at the time of follow-up  next visit. She and her husband are in agreement with the plan    (> 50% of this 15 min visit spent in face to face counseling or/and coordination of care)   Dr. Kalman Shan, M.D., [facility].C.P Pulmonary and Critical Care Medicine Staff Physician [facility]monary and Critical Care Pager: [phone], If no answer or between  15:00h - 7:00h: call [phone]  03/28/2015 1:57 PM

## 2015-03-28 NOTE — Patient Instructions (Addendum)
1. ILD (interstitial lung disease) (HCC) 2. Dyspnea  - Refer pulmonary rehabilitation fall out of proportion dyspnea and interstitial lung disease - Cardiopulmonary stress test on the  bike by Lesia Hausen  Follow-up  - After cardiopulmonary stress test to see me Dr. Marchelle Gearing in next 4 weeks - Complete blood count, liver function test and chemistry at the time of return in 4 weeks

## 2015-04-04 ENCOUNTER — Encounter (HOSPITAL_COMMUNITY): Payer: Self-pay | Admitting: Emergency Medicine

## 2015-04-04 ENCOUNTER — Telehealth (HOSPITAL_COMMUNITY): Payer: Self-pay | Admitting: *Deleted

## 2015-04-04 ENCOUNTER — Other Ambulatory Visit: Payer: Self-pay

## 2015-04-04 ENCOUNTER — Ambulatory Visit (HOSPITAL_COMMUNITY): Payer: Managed Care, Other (non HMO)

## 2015-04-04 ENCOUNTER — Emergency Department (HOSPITAL_COMMUNITY): Payer: Managed Care, Other (non HMO)

## 2015-04-04 ENCOUNTER — Emergency Department (HOSPITAL_COMMUNITY)
Admission: EM | Admit: 2015-04-04 | Discharge: 2015-04-04 | Disposition: A | Discharge status: Home / Self Care | Payer: Managed Care, Other (non HMO) | Attending: Emergency Medicine | Admitting: Emergency Medicine

## 2015-04-04 DIAGNOSIS — Z7952 Long term (current) use of systemic steroids: Secondary | ICD-10-CM | POA: Insufficient documentation

## 2015-04-04 DIAGNOSIS — M069 Rheumatoid arthritis, unspecified: Secondary | ICD-10-CM | POA: Insufficient documentation

## 2015-04-04 DIAGNOSIS — Z862 Personal history of diseases of the blood and blood-forming organs and certain disorders involving the immune mechanism: Secondary | ICD-10-CM | POA: Insufficient documentation

## 2015-04-04 DIAGNOSIS — R55 Syncope and collapse: Secondary | ICD-10-CM

## 2015-04-04 DIAGNOSIS — R0600 Dyspnea, unspecified: Secondary | ICD-10-CM

## 2015-04-04 DIAGNOSIS — R0602 Shortness of breath: Secondary | ICD-10-CM | POA: Insufficient documentation

## 2015-04-04 DIAGNOSIS — Z79899 Other long term (current) drug therapy: Secondary | ICD-10-CM | POA: Insufficient documentation

## 2015-04-04 DIAGNOSIS — M3210 Systemic lupus erythematosus, organ or system involvement unspecified: Secondary | ICD-10-CM | POA: Insufficient documentation

## 2015-04-04 LAB — COMPREHENSIVE METABOLIC PANEL
ALK PHOS: 65 U/L (ref 38–126)
ALT: 13 U/L — ABNORMAL LOW (ref 14–54)
ANION GAP: 7 (ref 5–15)
AST: 19 U/L (ref 15–41)
Albumin: 3.5 g/dL (ref 3.5–5.0)
BILIRUBIN TOTAL: 0.5 mg/dL (ref 0.3–1.2)
BUN: 7 mg/dL (ref 6–20)
CALCIUM: 8.7 mg/dL — AB (ref 8.9–10.3)
CO2: 24 mmol/L (ref 22–32)
Chloride: 105 mmol/L (ref 101–111)
Creatinine, Ser: 0.66 mg/dL (ref 0.44–1.00)
GFR calc Af Amer: >60 mL/min (ref >60)
GLUCOSE: 84 mg/dL (ref 65–99)
POTASSIUM: 3.7 mmol/L (ref 3.5–5.1)
Sodium: 136 mmol/L (ref 135–145)
TOTAL PROTEIN: 7.7 g/dL (ref 6.5–8.1)

## 2015-04-04 LAB — CBC WITH DIFFERENTIAL/PLATELET
BASOS ABS: 0 10*3/uL (ref 0.0–0.1)
Basophils Relative: 0 %
EOS ABS: 0.1 10*3/uL (ref 0.0–0.7)
Eosinophils Relative: 2 %
HCT: 30 % — ABNORMAL LOW (ref 36.0–46.0)
Hemoglobin: 9.6 g/dL — ABNORMAL LOW (ref 12.0–15.0)
LYMPHS ABS: 1 10*3/uL (ref 0.7–4.0)
Lymphocytes Relative: 23 %
MCH: 28.2 pg (ref 26.0–34.0)
MCHC: 32 g/dL (ref 30.0–36.0)
MCV: 88.2 fL (ref 78.0–100.0)
Monocytes Absolute: 0.2 10*3/uL (ref 0.1–1.0)
Monocytes Relative: 5 %
NEUTROS ABS: 3.1 10*3/uL (ref 1.7–7.7)
Neutrophils Relative %: 70 %
PLATELETS: UNDETERMINED 10*3/uL (ref 150–400)
RBC: 3.4 MIL/uL — AB (ref 3.87–5.11)
RDW: 14.9 % (ref 11.5–15.5)
WBC: 4.4 10*3/uL (ref 4.0–10.5)

## 2015-04-04 LAB — I-STAT TROPONIN, ED: TROPONIN I, POC: 0 ng/mL (ref 0.00–0.08)

## 2015-04-04 NOTE — Telephone Encounter (Signed)
Called to inform Dr. Marchelle Gearing of Patient's ED visit for post Cardiopulmonary Exercise Stress Test syncope. Dr. Marchelle Gearing requeted test results as normal.

## 2015-04-04 NOTE — ED Provider Notes (Signed)
CSN: [id number]     Arrival date & time 04/04/15  1004 History   First MD Initiated Contact with Patient 04/04/15 1018     Chief Complaint  Patient presents with  . Chest Pain     (Consider location/radiation/quality/duration/timing/severity/associated sxs/prior Treatment) Patient is a 44 y.o. female presenting with chest pain. The history is provided by the patient.  Chest Pain Associated symptoms: shortness of breath   Associated symptoms: no abdominal pain, no back pain, no headache, no nausea, no numbness, not vomiting and no weakness    patient presents with shortness of breath chest pain and syncope. She has interstitial lung disease due to her lupus and has seen pulmonology. He scheduled a pulmonary stress test to evaluate her dyspnea. While she was doing it she became more short of breath and had some chest pain. Blood pressure was reassuring at that time. She also had good oxygenation. She then felt more lightheaded and passed out. She's feeling better now. There was some worry about whether her dyspnea was out of proportion to her lung findings. No injury from a fall. She feels much better now. No known cardiac history otherwise.  Past Medical History  Diagnosis Date  . Rheumatoid arthritis (HCC)   . Lupus (HCC)   . G6PD deficiency [facility])    Past Surgical History  Procedure Laterality Date  . Cesarean section      X 2  . Cholecystectomy    . Tubal ligation    . Appendectomy     Family History  Problem Relation Age of Onset  . Sarcoidosis Mother    Social History  Substance Use Topics  . Smoking status: Former Smoker -- 0.50 packs/day for 14 years    Types: Cigarettes    Quit date: 09/25/2014  . Smokeless tobacco: Never Used  . Alcohol Use: No   OB History    No data available     Review of Systems  Constitutional: Negative for activity change and appetite change.  Eyes: Negative for pain.  Respiratory: Positive for shortness of breath. Negative for chest  tightness.   Cardiovascular: Positive for chest pain. Negative for leg swelling.  Gastrointestinal: Negative for nausea, vomiting, abdominal pain and diarrhea.  Genitourinary: Negative for flank pain.  Musculoskeletal: Negative for back pain and neck stiffness.  Skin: Negative for rash.  Neurological: Positive for syncope. Negative for weakness, numbness and headaches.  Psychiatric/Behavioral: Negative for behavioral problems.      Allergies  Asa and Sulfa antibiotics  Home Medications   Prior to Admission medications   Medication Sig Start Date End Date Taking? Authorizing Provider  albuterol (PROVENTIL HFA;VENTOLIN HFA) 108 (90 BASE) MCG/ACT inhaler Inhale 2 puffs into the lungs every 6 (six) hours as needed for wheezing or shortness of breath.   Yes Historical Provider, MD  ibuprofen (ADVIL,MOTRIN) 200 MG tablet Take 200 mg by mouth every 6 (six) hours as needed.   Yes Historical Provider, MD  mycophenolate (CELLCEPT) 500 MG tablet Take 2 tablets (1,000 mg total) by mouth 2 (two) times daily. 01/16/15  Yes Kalman Shan, MD  oxycodone (OXY-IR) 5 MG capsule Take 5 mg by mouth 2 (two) times daily.   Yes Historical Provider, MD  predniSONE (DELTASONE) 10 MG tablet Take 5 mg by mouth daily with breakfast.    Yes Historical Provider, MD   BP 139/93 mmHg  Pulse 71  Temp(Src) 98.2 F (36.8 C) (Oral)  Resp 18  SpO2 97%  LMP 03/28/2015 (Exact Date) Physical Exam  Constitutional: She is oriented to person, place, and time. She appears well-developed and well-nourished.  HENT:  Head: Normocephalic and atraumatic.  Eyes: EOM are normal. Pupils are equal, round, and reactive to light.  Neck: Normal range of motion. Neck supple.  Cardiovascular: Normal rate, regular rhythm and normal heart sounds.   No murmur heard. Pulmonary/Chest: Effort normal. No respiratory distress. She has no wheezes. She has no rales.  Mildly harsh breath sounds without frank rales.  Abdominal: Soft. Bowel  sounds are normal. She exhibits no distension. There is no tenderness.  Musculoskeletal: Normal range of motion.  Neurological: She is alert and oriented to person, place, and time. No cranial nerve deficit.  Skin: Skin is warm and dry.  Psychiatric: She has a normal mood and affect. Her speech is normal.  Nursing note and vitals reviewed.   ED Course  Procedures (including critical care time) Labs Review Labs Reviewed  COMPREHENSIVE METABOLIC PANEL - Abnormal; Notable for the following:    Calcium 8.7 (*)    ALT 13 (*)    All other components within normal limits  CBC WITH DIFFERENTIAL/PLATELET - Abnormal; Notable for the following:    RBC 3.40 (*)    Hemoglobin 9.6 (*)    HCT 30.0 (*)    All other components within normal limits  I-STAT TROPOININ, ED    Imaging Review Dg Chest Portable 1 View  04/04/2015  CLINICAL DATA:  Chest pain EXAM: PORTABLE CHEST 1 VIEW COMPARISON:  10/09/2013 FINDINGS: Streaky bibasilar lung markings unchanged most consistent with mild scarring. Negative for pneumonia. No pleural effusion. Heart size upper normal. Negative for heart failure or mass lesion. IMPRESSION: Mild bibasilar scarring is unchanged. No superimposed acute abnormality. Electronically Signed   By: Marlan Palau M.D.   On: 04/04/2015 11:17   I have personally reviewed and evaluated these images and lab results as part of my medical decision-making.   EKG Interpretation   Date/Time:  Wednesday April 04 2015 10:06:38 EDT Ventricular Rate:  63 PR Interval:  127 QRS Duration: 114 QT Interval:  409 QTC Calculation: 419 R Axis:   77 Text Interpretation:  Sinus rhythm Incomplete right bundle branch block  Confirmed by Rubin Payor  MD, Harrold Donath [phone]) on 04/04/2015 1:03:39 PM      MDM   Final diagnoses:  Syncope, unspecified syncope type    Patient with syncope. Was during pulmonary stress test. Discussed with the CHF clinic in the people administer the test. Overall reassuring  test. Likely pulmonary cause of the dyspnea. EKG reassuring here. Enzymes negative. Chest x-ray at baseline. Will discharge home.    Benjiman Core, MD 04/04/15 1309

## 2015-04-04 NOTE — ED Notes (Signed)
Pt coming from CHF clinic where she started having CP during a stress test. CP was initially 10 out of 10. CP now 4 out of 10. PT alert x4. NAD at this time.

## 2015-04-04 NOTE — Discharge Instructions (Signed)

## 2015-04-10 DIAGNOSIS — R0600 Dyspnea, unspecified: Secondary | ICD-10-CM | POA: Diagnosis not present

## 2015-04-17 ENCOUNTER — Telehealth (HOSPITAL_COMMUNITY): Payer: Self-pay | Admitting: *Deleted

## 2015-04-24 ENCOUNTER — Telehealth: Payer: Self-pay | Admitting: Internal Medicine

## 2015-04-24 DIAGNOSIS — J849 Interstitial pulmonary disease, unspecified: Secondary | ICD-10-CM

## 2015-04-24 NOTE — Telephone Encounter (Signed)
Patient notified of results of CPST.  Referral ordered for Dr. Marca Ancona for Right Heart Cath to Evaluate for Pulmonary Hypertension.  Patient notified of referral order.  Nothing further needed. Closing encounter

## 2015-04-24 NOTE — Telephone Encounter (Signed)
Please set her up with Dr Signe Colt Lean for right heart cath to eval for pulmonary hypertension - let her know tht CPST showed that beyond lungs she could be limited because of blood circulation in her lungs-heart    Conclusion: Exercise testing with gas-exchange demonstrates a severe functional impairment when compared to matched sedentary norms. At peak exercise, it appears the patient is severely Limited. Despite having restrictive lung disease with ILD she did not have ventilatory limitation or desaturation though she was somewhat tachypneic compared to norm. Limitation could be circulatory with excess deadspace suggestive of this. Clinical correlation recommended

## 2015-04-24 NOTE — Telephone Encounter (Signed)
Called spoke with pt. She is requesting her CPST results from 10/12 if it showed anything. Please advise MR thanks

## 2015-05-01 ENCOUNTER — Encounter (HOSPITAL_COMMUNITY): Payer: Self-pay | Admitting: *Deleted

## 2015-05-01 ENCOUNTER — Telehealth (HOSPITAL_COMMUNITY): Payer: Self-pay | Admitting: *Deleted

## 2015-05-01 ENCOUNTER — Other Ambulatory Visit (HOSPITAL_COMMUNITY): Payer: Self-pay | Admitting: *Deleted

## 2015-05-01 DIAGNOSIS — I272 Pulmonary hypertension, unspecified: Secondary | ICD-10-CM

## 2015-05-01 NOTE — Telephone Encounter (Signed)
Per Dr Jane Canary office pt needs RHC w/Dr Shirlee Latch to eval pulm htn, R[facility] sch for 11/17, pt aware and instructions mailed to her

## 2015-05-03 ENCOUNTER — Ambulatory Visit: Payer: Managed Care, Other (non HMO) | Admitting: Internal Medicine

## 2015-05-10 ENCOUNTER — Telehealth (HOSPITAL_COMMUNITY): Payer: Self-pay

## 2015-05-10 ENCOUNTER — Encounter (HOSPITAL_COMMUNITY)
Admission: RE | Disposition: A | Discharge status: Home / Self Care | Payer: Self-pay | Source: Ambulatory Visit | Attending: Cardiology

## 2015-05-10 ENCOUNTER — Ambulatory Visit (HOSPITAL_COMMUNITY)
Admission: RE | Admit: 2015-05-10 | Discharge: 2015-05-10 | Disposition: A | Discharge status: Home / Self Care | Payer: 59 | Source: Ambulatory Visit | Attending: Cardiology | Admitting: Cardiology

## 2015-05-10 DIAGNOSIS — Z87891 Personal history of nicotine dependence: Secondary | ICD-10-CM | POA: Diagnosis not present

## 2015-05-10 DIAGNOSIS — M069 Rheumatoid arthritis, unspecified: Secondary | ICD-10-CM | POA: Insufficient documentation

## 2015-05-10 DIAGNOSIS — M329 Systemic lupus erythematosus, unspecified: Secondary | ICD-10-CM | POA: Insufficient documentation

## 2015-05-10 DIAGNOSIS — I272 Other secondary pulmonary hypertension: Secondary | ICD-10-CM | POA: Diagnosis not present

## 2015-05-10 DIAGNOSIS — J849 Interstitial pulmonary disease, unspecified: Secondary | ICD-10-CM | POA: Insufficient documentation

## 2015-05-10 DIAGNOSIS — Z7952 Long term (current) use of systemic steroids: Secondary | ICD-10-CM | POA: Diagnosis not present

## 2015-05-10 DIAGNOSIS — I270 Primary pulmonary hypertension: Secondary | ICD-10-CM | POA: Diagnosis not present

## 2015-05-10 HISTORY — PX: CARDIAC CATHETERIZATION: SHX172

## 2015-05-10 LAB — POCT I-STAT 3, VENOUS BLOOD GAS (G3P V)
ACID-BASE DEFICIT: 1 mmol/L (ref 0.0–2.0)
BICARBONATE: 24.4 meq/L — AB (ref 20.0–24.0)
BICARBONATE: 25.7 meq/L — AB (ref 20.0–24.0)
O2 SAT: 67 %
O2 SAT: 69 %
PCO2 VEN: 40.3 mmHg — AB (ref 45.0–50.0)
PH VEN: 7.384 — AB (ref 7.250–7.300)
PO2 VEN: 36 mmHg (ref 30.0–45.0)
TCO2: 26 mmol/L (ref 0–100)
TCO2: 27 mmol/L (ref 0–100)
pCO2, Ven: 43 mmHg — ABNORMAL LOW (ref 45.0–50.0)
pH, Ven: 7.39 — ABNORMAL HIGH (ref 7.250–7.300)
pO2, Ven: 36 mmHg (ref 30.0–45.0)

## 2015-05-10 LAB — BASIC METABOLIC PANEL
ANION GAP: 6 (ref 5–15)
BUN: 7 mg/dL (ref 6–20)
CALCIUM: 9 mg/dL (ref 8.9–10.3)
CO2: 27 mmol/L (ref 22–32)
Chloride: 105 mmol/L (ref 101–111)
Creatinine, Ser: 0.66 mg/dL (ref 0.44–1.00)
GLUCOSE: 82 mg/dL (ref 65–99)
Potassium: 3.4 mmol/L — ABNORMAL LOW (ref 3.5–5.1)
Sodium: 138 mmol/L (ref 135–145)

## 2015-05-10 LAB — CBC
HCT: 29.1 % — ABNORMAL LOW (ref 36.0–46.0)
HEMOGLOBIN: 9.2 g/dL — AB (ref 12.0–15.0)
MCH: 27.5 pg (ref 26.0–34.0)
MCHC: 31.6 g/dL (ref 30.0–36.0)
MCV: 87.1 fL (ref 78.0–100.0)
Platelets: 280 10*3/uL (ref 150–400)
RBC: 3.34 MIL/uL — ABNORMAL LOW (ref 3.87–5.11)
RDW: 14.5 % (ref 11.5–15.5)
WBC: 3.3 10*3/uL — ABNORMAL LOW (ref 4.0–10.5)

## 2015-05-10 LAB — PROTIME-INR
INR: 1.13 (ref 0.00–1.49)
PROTHROMBIN TIME: 14.7 s (ref 11.6–15.2)

## 2015-05-10 SURGERY — RIGHT HEART CATH

## 2015-05-10 MED ORDER — MIDAZOLAM HCL 2 MG/2ML IJ SOLN
INTRAMUSCULAR | Status: AC
  Filled 2015-05-10: qty 2

## 2015-05-10 MED ORDER — SODIUM CHLORIDE 0.9 % WEIGHT BASED INFUSION: 1.0000 mL/kg/h | INTRAVENOUS | Status: DC

## 2015-05-10 MED ORDER — SODIUM CHLORIDE 0.9 % IV SOLN
INTRAVENOUS | Status: DC
  Administered 2015-05-10: 13:00:00 via INTRAVENOUS

## 2015-05-10 MED ORDER — HEPARIN (PORCINE) IN NACL 2-0.9 UNIT/ML-% IJ SOLN
INTRAMUSCULAR | Status: AC
  Filled 2015-05-10: qty 500

## 2015-05-10 MED ORDER — LIDOCAINE HCL (PF) 1 % IJ SOLN
INTRAMUSCULAR | Status: AC
  Filled 2015-05-10: qty 30

## 2015-05-10 MED ORDER — MIDAZOLAM HCL 2 MG/2ML IJ SOLN
INTRAMUSCULAR | Status: DC | PRN
  Administered 2015-05-10 (×3): 1 mg via INTRAVENOUS

## 2015-05-10 MED ORDER — POTASSIUM CHLORIDE CRYS ER 20 MEQ PO TBCR
EXTENDED_RELEASE_TABLET | ORAL | Status: AC
  Filled 2015-05-10: qty 2

## 2015-05-10 MED ORDER — SODIUM CHLORIDE 0.9 % IJ SOLN: 3.0000 mL | INTRAMUSCULAR | Status: DC | PRN

## 2015-05-10 MED ORDER — SODIUM CHLORIDE 0.9 % IV SOLN: 250.0000 mL | INTRAVENOUS | Status: DC | PRN

## 2015-05-10 MED ORDER — POTASSIUM CHLORIDE 20 MEQ/15ML (10%) PO SOLN: 40.0000 meq | Freq: Once | ORAL | Status: DC

## 2015-05-10 MED ORDER — ONDANSETRON HCL 4 MG/2ML IJ SOLN: 4.0000 mg | Freq: Four times a day (QID) | INTRAMUSCULAR | Status: DC | PRN

## 2015-05-10 MED ORDER — LIDOCAINE HCL (PF) 1 % IJ SOLN
INTRAMUSCULAR | Status: DC | PRN
  Administered 2015-05-10: 15:00:00

## 2015-05-10 MED ORDER — POTASSIUM CHLORIDE CRYS ER 20 MEQ PO TBCR
40.0000 meq | EXTENDED_RELEASE_TABLET | Freq: Once | ORAL | Status: AC
  Administered 2015-05-10: 40 meq via ORAL

## 2015-05-10 MED ORDER — FENTANYL CITRATE (PF) 100 MCG/2ML IJ SOLN
INTRAMUSCULAR | Status: DC | PRN
  Administered 2015-05-10 (×3): 25 ug via INTRAVENOUS

## 2015-05-10 MED ORDER — SODIUM CHLORIDE 0.9 % IJ SOLN: 3.0000 mL | Freq: Two times a day (BID) | INTRAMUSCULAR | Status: DC

## 2015-05-10 MED ORDER — ACETAMINOPHEN 325 MG PO TABS: 650.0000 mg | ORAL_TABLET | ORAL | Status: DC | PRN

## 2015-05-10 MED ORDER — FENTANYL CITRATE (PF) 100 MCG/2ML IJ SOLN
INTRAMUSCULAR | Status: AC
  Filled 2015-05-10: qty 2

## 2015-05-10 SURGICAL SUPPLY — 10 items
CATH BALLN WEDGE 5F 110CM (CATHETERS) IMPLANT
CATH SWAN GANZ 7F STRAIGHT (CATHETERS) ×2 IMPLANT
GUIDEWIRE .025 260CM (WIRE) ×2 IMPLANT
KIT HEART RIGHT NAMIC (KITS) ×2 IMPLANT
PACK CARDIAC CATHETERIZATION (CUSTOM PROCEDURE TRAY) ×2 IMPLANT
SHEATH FAST CATH BRACH 5F 5CM (SHEATH) ×2 IMPLANT
SHEATH PINNACLE 7F 10CM (SHEATH) ×2 IMPLANT
TRANSDUCER W/STOPCOCK (MISCELLANEOUS) ×2 IMPLANT
TUBING CIL FLEX 10 FLL-RA (TUBING) ×2 IMPLANT
WIRE EMERALD 3MM-J .025X260CM (WIRE) ×2 IMPLANT

## 2015-05-10 NOTE — Discharge Instructions (Signed)
Angiogram, Care After Refer to this sheet in the next few weeks. These instructions provide you with information about caring for yourself after your procedure. Your health care provider may also give you more specific instructions. Your treatment has been planned according to current medical practices, but problems sometimes occur. Call your health care provider if you have any problems or questions after your procedure. WHAT TO EXPECT AFTER THE PROCEDURE After your procedure, it is typical to have the following:  Bruising at the catheter insertion site that usually fades within 1-2 weeks.  Blood collecting in the tissue (hematoma) that may be painful to the touch. It should usually decrease in size and tenderness within 1-2 weeks. HOME CARE INSTRUCTIONS  Take medicines only as directed by your health care provider.  You may shower 24-48 hours after the procedure or as directed by your health care provider. Remove the bandage (dressing) and gently wash the site with plain soap and water. Pat the area dry with a clean towel. Do not rub the site, because this may cause bleeding.  Do not take baths, swim, or use a hot tub until your health care provider approves. 3 days   Check your insertion site every day for redness, swelling, or drainage.  Do not apply powder or lotion to the site.  Do not lift over 10 lb (4.5 kg) for 5 days after your procedure or as directed by your health care provider.  Ask your health care provider when it is okay to:  Return to work or school.  Resume usual physical activities or sports.  Resume sexual activity.  Do not drive home if you are discharged the same day as the procedure. Have someone else drive you.  You may drive 24 hours after the procedure unless otherwise instructed by your health care provider.  Do not operate machinery or power tools for 24 hours after the procedure or as directed by your health care provider.  If your procedure was done  as an outpatient procedure, which means that you went home the same day as your procedure, a responsible adult should be with you for the first 24 hours after you arrive home.  Keep all follow-up visits as directed by your health care provider. This is important. SEEK MEDICAL CARE IF:  You have a fever.  You have chills.  You have increased bleeding from the catheter insertion site. Hold pressure on the site. SEEK IMMEDIATE MEDICAL CARE IF:  You have unusual pain at the catheter insertion site.  You have redness, warmth, or swelling at the catheter insertion site.  You have drainage (other than a small amount of blood on the dressing) from the catheter insertion site.  The catheter insertion site is bleeding, and the bleeding does not stop after 30 minutes of holding steady pressure on the site.  The area near or just beyond the catheter insertion site becomes pale, cool, tingly, or numb.   This information is not intended to replace advice given to you by your health care provider. Make sure you discuss any questions you have with your health care provider.   Document Released: 12/26/2004 Document Revised: 06/30/2014 Document Reviewed: 11/10/2012 Elsevier Interactive Patient Education [employer]

## 2015-05-10 NOTE — H&P (Signed)
Physician History and Physical    Shannon [name] MRN: [id number] DOB/AGE: 09/12/1970 44 y.o. Admit date: 05/10/2015  HPI:  44 yo with autoimmune interstitial lung disease presents for R[facility] to evaluate for pulmonary hypertension.  She had abnormal CPX suggesting possible pulmonary vascular disease.   Review of systems complete and found to be negative unless listed above   Past Medical History  Diagnosis Date  . Rheumatoid arthritis (HCC)   . Lupus (HCC)   . G6PD deficiency (HCC)     Family History  Problem Relation Age of Onset  . Sarcoidosis Mother     Social History   Social History  . Marital Status: Married    Spouse Name: N/A  . Number of Children: N/A  . Years of Education: N/A   Occupational History  . [employer]   Social History Main Topics  . Smoking status: Former Smoker -- 0.50 packs/day for 14 years    Types: Cigarettes    Quit date: 09/25/2014  . Smokeless tobacco: Never Used  . Alcohol Use: No  . Drug Use: No  . Sexual Activity: Not on file   Other Topics Concern  . Not on file   Social History Narrative     Prescriptions prior to admission  Medication Sig Dispense Refill Last Dose  . albuterol (PROVENTIL HFA;VENTOLIN HFA) 108 (90 BASE) MCG/ACT inhaler Inhale 2 puffs into the lungs every 6 (six) hours as needed for wheezing or shortness of breath.   Past Week at Unknown time  . gabapentin (NEURONTIN) 300 MG capsule Take 300 mg by mouth at bedtime.   05/09/2015 at Unknown time  . ibuprofen (ADVIL,MOTRIN) 200 MG tablet Take 200 mg by mouth every 6 (six) hours as needed for moderate pain.    Past Week at Unknown time  . mycophenolate (CELLCEPT) 500 MG tablet Take 2 tablets (1,000 mg total) by mouth 2 (two) times daily. 120 tablet 5 05/09/2015 at Unknown time  . oxyCODONE (OXY IR/ROXICODONE) 5 MG immediate release tablet Take 5 mg by mouth 2 (two) times daily.   Past Week at Unknown time  . predniSONE (DELTASONE) 10 MG tablet Take 10 mg  by mouth daily with breakfast.    05/09/2015 at Unknown time    Physical Exam: Blood pressure 133/97, pulse 77, temperature 98 F (36.7 C), temperature source Oral, resp. rate 18, height 5\' 11"  (1.803 m), weight 210 lb (95.255 kg), last menstrual period 04/28/2015, SpO2 100 %.  General: NAD Neck: No JVD, no thyromegaly or thyroid nodule.  Lungs: Crackles bilaterally, dry.  CV: Nondisplaced PMI.  Heart regular S1/S2, no S3/S4, no murmur.  No peripheral edema.  No carotid bruit.  Normal pedal pulses.  Abdomen: Soft, nontender, no hepatosplenomegaly, no distention.  Skin: Intact without lesions or rashes.  Neurologic: Alert and oriented x 3.  Psych: Normal affect. Extremities: No clubbing or cyanosis.  HEENT: Normal.   Labs:   Lab Results  Component Value Date   WBC 3.3* 05/10/2015   HGB 9.2* 05/10/2015   HCT 29.1* 05/10/2015   MCV 87.1 05/10/2015   PLT 280 05/10/2015    Recent Labs Lab 05/10/15 1215  NA 138  K 3.4*  CL 105  CO2 27  BUN 7  CREATININE 0.66  CALCIUM 9.0  GLUCOSE 82   No results found for: CKTOTAL, CKMB, CKMBINDEX, TROPONINI No results found for: CHOL No results found for: HDL No results found for: LDLCALC No results found for: TRIG No results found for:  CHOLHDL No results found for: LDLDIRECT    ASSESSMENT AND PLAN:  Procedure discussed with patient, she agrees to proceed.  She understands risks/benefits.    Signed: Marca Ancona 05/10/2015, 2:20 PM

## 2015-05-10 NOTE — Telephone Encounter (Signed)
Short term disability paper work completed and sent to [employer] [phone] [facility], Benefits Center

## 2015-05-11 ENCOUNTER — Encounter (HOSPITAL_COMMUNITY): Payer: Self-pay | Admitting: Cardiology

## 2015-05-11 ENCOUNTER — Telehealth: Payer: Self-pay | Admitting: Cardiology

## 2015-05-11 NOTE — Telephone Encounter (Signed)
New Message (General)   Pt called states that she recently had a cath procedure. Request a call back to determine when she can return the work and what the limitations are. Please call

## 2015-05-11 NOTE — Telephone Encounter (Signed)
Called patient about Dr. Alford Highland message. Patient requesting letter. Will type up letter with Dr. Alford Highland recommendations.

## 2015-05-11 NOTE — Telephone Encounter (Signed)
She could go back to work on Monday.  Just keep lifting under 15 lbs for about a week (she had a right heart cath only).

## 2015-05-15 ENCOUNTER — Telehealth (HOSPITAL_COMMUNITY): Payer: Self-pay | Admitting: Cardiology

## 2015-05-15 NOTE — Telephone Encounter (Signed)
Pt scheduled fro R/L Heart Cath with Dr.McLean Cpt code 22979 auth # [id number] valid until 08/05/15

## 2015-07-04 ENCOUNTER — Encounter: Payer: Self-pay | Admitting: Internal Medicine

## 2015-07-04 ENCOUNTER — Ambulatory Visit (INDEPENDENT_AMBULATORY_CARE_PROVIDER_SITE_OTHER): Payer: 59 | Admitting: Internal Medicine

## 2015-07-04 ENCOUNTER — Other Ambulatory Visit (INDEPENDENT_AMBULATORY_CARE_PROVIDER_SITE_OTHER): Payer: 59

## 2015-07-04 VITALS — BP 124/62 | HR 93 | Ht 71.0 in | Wt 215.4 lb

## 2015-07-04 DIAGNOSIS — Z79899 Other long term (current) drug therapy: Secondary | ICD-10-CM

## 2015-07-04 DIAGNOSIS — J849 Interstitial pulmonary disease, unspecified: Secondary | ICD-10-CM

## 2015-07-04 DIAGNOSIS — R0600 Dyspnea, unspecified: Secondary | ICD-10-CM | POA: Diagnosis not present

## 2015-07-04 LAB — HEPATIC FUNCTION PANEL
ALBUMIN: 3.8 g/dL (ref 3.5–5.2)
ALK PHOS: 65 U/L (ref 39–117)
ALT: 8 U/L (ref 0–35)
AST: 14 U/L (ref 0–37)
BILIRUBIN DIRECT: 0 mg/dL (ref 0.0–0.3)
TOTAL PROTEIN: 7.6 g/dL (ref 6.0–8.3)
Total Bilirubin: 0.2 mg/dL (ref 0.2–1.2)

## 2015-07-04 LAB — BASIC METABOLIC PANEL
BUN: 11 mg/dL (ref 6–23)
CO2: 29 mEq/L (ref 19–32)
CREATININE: 0.65 mg/dL (ref 0.40–1.20)
Calcium: 8.8 mg/dL (ref 8.4–10.5)
Chloride: 103 mEq/L (ref 96–112)
GFR: 128.19 mL/min (ref >60.00)
GLUCOSE: 93 mg/dL (ref 70–99)
POTASSIUM: 3.3 meq/L — AB (ref 3.5–5.1)
Sodium: 138 mEq/L (ref 135–145)

## 2015-07-04 LAB — CBC WITH DIFFERENTIAL/PLATELET
BASOS PCT: 0.4 % (ref 0.0–3.0)
Basophils Absolute: 0 10*3/uL (ref 0.0–0.1)
EOS ABS: 0.1 10*3/uL (ref 0.0–0.7)
EOS PCT: 1.5 % (ref 0.0–5.0)
HEMATOCRIT: 27.9 % — AB (ref 36.0–46.0)
Hemoglobin: 8.8 g/dL — ABNORMAL LOW (ref 12.0–15.0)
LYMPHS PCT: 38.2 % (ref 12.0–46.0)
Lymphs Abs: 1.9 10*3/uL (ref 0.7–4.0)
MCHC: 31.6 g/dL (ref 30.0–36.0)
MCV: 85.9 fl (ref 78.0–100.0)
Monocytes Absolute: 0.3 10*3/uL (ref 0.1–1.0)
Monocytes Relative: 5.9 % (ref 3.0–12.0)
NEUTROS ABS: 2.6 10*3/uL (ref 1.4–7.7)
Neutrophils Relative %: 54 % (ref 43.0–77.0)
PLATELETS: 283 10*3/uL (ref 150.0–400.0)
RBC: 3.24 Mil/uL — ABNORMAL LOW (ref 3.87–5.11)
RDW: 16.7 % — AB (ref 11.5–15.5)
WBC: 4.9 10*3/uL (ref 4.0–10.5)

## 2015-07-04 LAB — PHOSPHORUS: Phosphorus: 3.2 mg/dL (ref 2.3–4.6)

## 2015-07-04 LAB — MAGNESIUM: MAGNESIUM: 1.9 mg/dL (ref 1.5–2.5)

## 2015-07-04 NOTE — Progress Notes (Signed)
Subjective:     Patient ID: Shannon [name], female   DOB: Nov 16, 1970, 45 y.o.   MRN: [id number]  HPI    PCP [name],[name], PA-C Referred by Dr [name]  HPI   IOV 08/23/2014  Chief Complaint  Patient presents with  . Pulmonary Consult    Pt referred by Dr. Corliss Skains for ILD. Pt c/o SOB with activity and rest, dry cough and chest tightness also with and without activity.    45 year old female referred for evaluation of autoimmune interstitial lung disease. She presents with her husband. In 2010 while living in [place] she reports she was diagnosed with lupus associated with rheumatoid arthritis in her joints. In 2012 she moved to live in [facility] and several months after that started noticing insidious onset of shortness of breath. Local rheumatologist diagnosed her with interstitial lung disease. She was referred to Dr. Herma Carson at [facility] in [facility] and was started on CellCept/prednisone for autoimmune interstitial lung disease he and however, sometime around 2 years ago she ran out of medical insurance and stopped taking these medications. During this time her dyspnea has progressed. It is currently rated as moderate to severe. It is present on exertion and relieved by rest. Even minimal amount of exertion makes her very dyspneic. Now she has insurance and she did see Dr. Frederik Pear locally and she has autoimmune panel lab ordered. In addition exam revealed crackles and there for she's been referred here for reevaluation of interstitial lung disease and dyspnea. Dyspnea is associated with some chest tightness but no chest pain. This no associated dizzine   11/24/2014 Follow UP ILD  [name]  She has autoimmune ILD with RA/Lupus  She was seen 6 weeks ago, restarted on Cellcept.  Previously on cellcept but lost her insurance until recently.  On low dose prednisone 5mg  daily .  Last CT chest 4/4 showed ILD changes similar to  2013. Echo was ok with EFG 55-60%, nml PAP . In March .   Did not take dapsone ,due to  GP6D deficiency.  Labs ok last week with nml LFT , no sign change in hbg. /wbc.  She is feeling better. Does feel her breathing is some better.  No flare of cough or wheezing.     OV 01/16/2015  Chief Complaint  Patient presents with  . Follow-up    Pt c/o DOE, mild dry cough, and chest tightness when SOB. Pt states the chest tightness has improved).    follow-up interstitial lung disease in the setting of rheumatoid arthritis Follow-up high risk medication use - on CellCept and prednisone since mid April 2016  - Presents with her husband. Both give a history. Overall she is doing better in terms of dyspnea after starting CellCept and prednisone. However the improvement this only moderate. She still left with a residual moderate dyspnea on exertion that is also made worse with bending or heart air and improved with rest and cool air. It is associated with some cough and wheezing. She takes albuterol inhaler which she feels helps only somewhat but not fully and not quickly enough. She is frustrated by this. In addition she's complaining of some associated right lower back paraspinal spasm for which massage gives her relief. He has never attended pulmonary rehabilitation.  - Lab review shows she has had problems with potassium and has had potassium supplementation. Last lab check was 12/08/2014. She is due for lab test right now   OV  03/28/2015   Chief Complaint  Patient presents with  . Follow-up    3 month follow up. Pt states that she is still having some problems with her breathing. Pt c/o of feeling chest tightness, chest pain and cough that is dry. Pt denies wheeze. Pt states that she did trial the Advair and does feel that it helped some.   Follow-up interstitial lung disease secondary to autoimmune process and associated dyspnea that seems out of proportion\  She presents with her husband.  She continues on CellCept and prednisone. In the last visit approximately 2-3 months ago she had out of proportion dyspnea. I gave her some Advair to try. She says this only helped a little bit. Overall she says that dyspnea still persists. It is worse than in the spring 2016 when she started CellCept and prednisone. It is stable since July 2016. It is moderate in intensity. Occurs randomly at rest but also with exertion. Occasionally relieved by rest but also happens at rest. Heat and humidity make it worse. Advair helped a little bit only. This no associated chest tightness or wheezing. She did try and enroll  in pulmonary rehabilitation but could not afford the the startup program and is waiting to hear from them for the maintenance program. She is frustrated by all the symptoms.  Pulmonary function test October 15,016 today FVC 2.55 L/64%, total lung capacity 4 L/65% and diffusion 14.5/42%. Overall no change since April 2016   OV 07/04/2015  Chief Complaint  Patient presents with  . Follow-up    pt. states breathing is baseline. SOB. dry cough. wheezing. occ. chest pain/tightness. feels her airway is blocked.   Follow-up interstitial lung disease admitted autoimmune processes and associated out of proportion dyspnea  She presents again with her husband. She continues on CellCept 2 g twice daily associated with prednisone. She cannot take Bactrim or dapsone due to G6PD deficiency. Last seen in the fall of 2016. She was having out of proportion dyspnea. Rated cardia pulmonary stress test and she could not tolerate this test. She then underwent right heart catheterization mid-November 2016 with Dr. Marca Ancona. Review the tests this is normal. Overall stable but she and husband still continued to complain about this resting dyspnea associated with wheezing. They hear noises in the chest. Sometime she gasps for air even at rest. She feels it comes from the chest but the husband points to the throat. I  offered second opinion at [facility] because of this unusual symptoms but they declined citing distance. She was supposed to attend pulmonary rehabilitation but they cannot afford a $60 co-pay twice a week 8 weeks. Offered ENT evaluation that agreeable but they wanted done in [place] which is closer logistically. In addition patient is contemplating now applying for disability. 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  Walking desaturation test 185 feet 3 laps and rheumatic: No desaturation   Current outpatient prescriptions:  .  albuterol (PROVENTIL HFA;VENTOLIN HFA) 108 (90 BASE) MCG/ACT inhaler, Inhale 2 puffs into the lungs every 6 (six) hours as needed for wheezing or shortness of breath., Disp: , Rfl:  .  gabapentin (NEURONTIN) 300 MG capsule, Take 300 mg by mouth at bedtime., Disp: , Rfl:  .  ibuprofen (ADVIL,MOTRIN) 200 MG tablet, Take 200 mg by mouth every 6 (six) hours as needed for moderate pain. , Disp: , Rfl:  .  mycophenolate (CELLCEPT) 500 MG tablet, Take 2 tablets (1,000 mg total) by mouth 2 (two) times daily.,  Disp: 120 tablet, Rfl: 5 .  predniSONE (DELTASONE) 10 MG tablet, Take 10 mg by mouth daily with breakfast. , Disp: , Rfl:    Allergies  Allergen Reactions  . Asa [Aspirin]     HIves  . Sulfa Antibiotics     G6PD deficiency     Immunization History  Administered Date(s) Administered  . Influenza Split 03/23/2014  . Pneumococcal-Unspecified 08/23/2014      Review of Systems Per HPI    Objective:   Physical Exam  Constitutional: She is oriented to person, place, and time. She appears well-developed and well-nourished. No distress.  HENT:  Head: Normocephalic and atraumatic.  Right Ear: External ear normal.  Left Ear: External ear normal.  Mouth/Throat: Oropharynx is clear and moist. No oropharyngeal exudate.  Eyes: Conjunctivae and EOM are normal. Pupils are equal, round, and reactive to light. Right eye exhibits no discharge. Left eye  exhibits no discharge. No scleral icterus.  Neck: Normal range of motion. Neck supple. No JVD present. No tracheal deviation present. No thyromegaly present.  Cardiovascular: Normal rate, regular rhythm, normal heart sounds and intact distal pulses.  Exam reveals no gallop and no friction rub.   No murmur heard. Pulmonary/Chest: Effort normal. No respiratory distress. She has no wheezes. She has rales. She exhibits no tenderness.  Abdominal: Soft. Bowel sounds are normal. She exhibits no distension and no mass. There is no tenderness. There is no rebound and no guarding.  Musculoskeletal: Normal range of motion. She exhibits no edema or tenderness.  Lymphadenopathy:    She has no cervical adenopathy.  Neurological: She is alert and oriented to person, place, and time. She has normal reflexes. No cranial nerve deficit. She exhibits normal muscle tone. Coordination normal.  Skin: Skin is warm and dry. No rash noted. She is not diaphoretic. No erythema. No pallor.  Psychiatric: She has a normal mood and affect. Her behavior is normal. Judgment and thought content normal.  Vitals reviewed.   Filed Vitals:   07/04/15 1558  BP: 124/62  Pulse: 93  Height: 5\' 11"  (1.803 m)  Weight: 215 lb 6.4 oz (97.705 kg)  SpO2: 96%        Assessment:       ICD-9-CM ICD-10-CM   1. ILD (interstitial lung disease) (HCC) 515 J84.9   2. High risk medication use V58.69 Z79.899   3. Dyspnea 786.09 R06.00        Plan:     ILD (interstitial lung disease) due to Rheumatoid arthritis   - disease appears stable but wil do WALK TEST  Room AIr 07/04/2015 - continue cellcept 1000mg  bid and prednisone daily as before  - repeat full PFT in 3 months - ok to apply for disability  High risk medication use  - do CBCD, Bmet, mag, phos, LFT 07/04/2015   Dyspnea/ SHortness of breath/  ? Stridor  - unclear what this resting symptoms are - ? stridor  - refer ENT in [place]  Followup  - will call with blood  reuslts  - pft in 3 months  - return in 3 months after PFT   Dr. , M.D., [facility].C.P Pulmonary and Critical Care Medicine Staff Physician [facility] [place] [facility] Pager: [phone], If no answer or between  15:00h - 7:00h: call [phone]  07/04/2015 4:20 PM

## 2015-07-04 NOTE — Patient Instructions (Addendum)
ILD (interstitial lung disease) due to Rheumatoid arthritis   - disease appears stable but wil do WALK TEST  Room AIr 07/04/2015 - continue cellcept 1000mg  bid and prednisone daily as before  - repeat full PFT in 3 months - ok to apply for disability  High risk medication use  - do CBCD, Bmet, mag, phos, LFT 07/04/2015   Dyspnea/ SHortness of breath/  ? Stridor  - unclear what this resting symptoms are   - refer ENT in [place]  Followup  - will call with blood reuslts  - pft in 3 months  - return in 3 months after PFT

## 2015-07-04 NOTE — Addendum Note (Signed)
Addended by: Nicanor Alcon on: 07/04/2015 04:31 PM   Modules accepted: Orders

## 2015-07-06 ENCOUNTER — Telehealth: Payer: Self-pay | Admitting: Internal Medicine

## 2015-07-06 ENCOUNTER — Telehealth (HOSPITAL_COMMUNITY): Payer: Self-pay

## 2015-07-06 DIAGNOSIS — D582 Other hemoglobinopathies: Secondary | ICD-10-CM

## 2015-07-06 DIAGNOSIS — E876 Hypokalemia: Secondary | ICD-10-CM

## 2015-07-06 MED ORDER — POTASSIUM CHLORIDE CRYS ER 20 MEQ PO TBCR: 20.0000 meq | EXTENDED_RELEASE_TABLET | Freq: Every day | ORAL | Status: DC

## 2015-07-06 NOTE — Telephone Encounter (Signed)
  Please call patient and let her know that hemoglobin is 8.8 g percent. Not sure if this is because of CellCept or something else. She needs to talk to primary care physician and get anemia workup. She should repeat a CBC with [place] in 1 month  Also potassium slightly low at 3.3, Ask her to  take potassium chloride 20 mEq 1 week. Do follow-up bmet in 1 month     Current outpatient prescriptions:  .  albuterol (PROVENTIL HFA;VENTOLIN HFA) 108 (90 BASE) MCG/ACT inhaler, Inhale 2 puffs into the lungs every 6 (six) hours as needed for wheezing or shortness of breath., Disp: , Rfl:  .  gabapentin (NEURONTIN) 300 MG capsule, Take 300 mg by mouth at bedtime., Disp: , Rfl:  .  ibuprofen (ADVIL,MOTRIN) 200 MG tablet, Take 200 mg by mouth every 6 (six) hours as needed for moderate pain. , Disp: , Rfl:  .  mycophenolate (CELLCEPT) 500 MG tablet, Take 2 tablets (1,000 mg total) by mouth 2 (two) times daily., Disp: 120 tablet, Rfl: 5 .  predniSONE (DELTASONE) 10 MG tablet, Take 10 mg by mouth daily with breakfast. , Disp: , Rfl:    PULMONARY No results for input(s): PHART, PCO2ART, PO2ART, HCO3, TCO2, O2SAT in the last 168 hours.  Invalid input(s): PCO2, PO2  CBC  Recent Labs Lab 07/04/15 1701  HGB 8.8 Repeated and verified X2.*  HCT 27.9*  WBC 4.9  PLT 283.0    COAGULATION No results for input(s): INR in the last 168 hours.  CARDIAC  No results for input(s): TROPONINI in the last 168 hours. No results for input(s): PROBNP in the last 168 hours.   CHEMISTRY  Recent Labs Lab 07/04/15 1701  NA 138  K 3.3*  CL 103  CO2 29  GLUCOSE 93  BUN 11  CREATININE 0.65  CALCIUM 8.8  MG 1.9  PHOS 3.2   Estimated Creatinine Clearance: 118 mL/min (by C-G formula based on Cr of 0.65).   LIVER  Recent Labs Lab 07/04/15 1701  AST 14  ALT 8  ALKPHOS 65  BILITOT 0.2  PROT 7.6  ALBUMIN 3.8     INFECTIOUS No results for input(s): LATICACIDVEN, PROCALCITON in the last 168  hours.   ENDOCRINE CBG (last 3)  No results for input(s): GLUCAP in the last 72 hours.       IMAGING x48h  - image(s) personally visualized  -   highlighted in bold No results found.

## 2015-07-06 NOTE — Telephone Encounter (Signed)
Pt returning call.Shannon [name] ° °

## 2015-07-06 NOTE — Telephone Encounter (Signed)
Pt is aware of results. Rx has been sent in. Orders have been placed. Nothing further was needed.

## 2015-07-06 NOTE — Telephone Encounter (Signed)
lmtcb x1 for pt. 

## 2015-08-03 ENCOUNTER — Telehealth: Payer: Self-pay | Admitting: Internal Medicine

## 2015-08-03 NOTE — Telephone Encounter (Signed)
Called spoke with pt. She reports she saw her ENT doc last Friday, Dr. Marcheta Grammes @[place]  [facility]. She reports he wants her to have a "larynscopy w/ biopsy". Was told to call to set this up. She wants to know if we received any notes of this. [place], did you receive the ENT note? If not, we can call for it. thanks

## 2015-08-06 NOTE — Telephone Encounter (Signed)
No. I have not received any notes on this pt. Called [facility] at [phone] and spoke to receptionist. Records are to be faxed to front fax. Will await fax.

## 2015-08-06 NOTE — Telephone Encounter (Signed)
Shannon [name] - did you get any medical records from ENT? Please advise.

## 2015-08-08 NOTE — Telephone Encounter (Signed)
[phone], pt cb

## 2015-08-08 NOTE — Telephone Encounter (Addendum)
I have checked MR's look at and up front. Records have not been received yet. Will await records.

## 2015-08-08 NOTE — Telephone Encounter (Signed)
The records have been received and placed in MR's look-ats.  lmtcb for pt.

## 2015-08-08 NOTE — Telephone Encounter (Signed)
Will forward to MR as FYI - these are in look folder.

## 2015-08-08 NOTE — Telephone Encounter (Signed)
Pt is aware that we have received these records.

## 2015-08-09 ENCOUNTER — Telehealth: Payer: Self-pay | Admitting: Internal Medicine

## 2015-08-09 NOTE — Telephone Encounter (Signed)
Spoke with pt. Made her aware that these forms will need to go to [employer]. The fax number has been given to the pt. She agreed and verbalized understanding. Nothing further was needed.

## 2015-08-09 NOTE — Telephone Encounter (Signed)
The ent doc saw sub-glottic stenosis - this is why she is having her symptoms. THe note I just reviewed says she is referred to [facility]- she should do that.

## 2015-08-09 NOTE — Telephone Encounter (Signed)
Spoke with pt. She is aware of the below information. Nothing further was needed. 

## 2015-08-10 ENCOUNTER — Telehealth: Payer: Self-pay | Admitting: Internal Medicine

## 2015-08-10 DIAGNOSIS — J386 Stenosis of larynx: Secondary | ICD-10-CM

## 2015-08-10 NOTE — Telephone Encounter (Signed)
Referral has been placed. Pt is aware. Nothing further was needed. 

## 2015-08-10 NOTE — Telephone Encounter (Signed)
Per previous phone note: Kalman Shan, MD at 08/09/2015 1:29 PM     Status: Signed       Expand All Collapse All   The ent doc saw sub-glottic stenosis - this is why she is having her symptoms. THe note I just reviewed says she is referred to [facility]- she should do that.       --  Called spoke with pt. She reports she is not being referred to [facility]. She was under the impression that we were going to refer her to an ENT within [facility] system to have the procedure done again. Please advise MR thanks

## 2015-08-10 NOTE — Telephone Encounter (Signed)
lmtcb x1 for pt. 

## 2015-08-10 NOTE — Telephone Encounter (Signed)
Pt returning call.Shannon [name] ° °

## 2015-08-10 NOTE — Telephone Encounter (Signed)
Ok please refer to [facility] - one of the docs there . Not sure why [place] [facility] doc would say something like this but does not matter

## 2015-08-13 ENCOUNTER — Telehealth: Payer: Self-pay | Admitting: Internal Medicine

## 2015-08-13 NOTE — Telephone Encounter (Signed)
Pt has not heard anything and wants a call back at this # [phone]

## 2015-08-13 NOTE — Telephone Encounter (Signed)
Pt aware of this appt to see dr Pollyann Kennedy 08/23/15@1 :50pm Tobe Sos

## 2015-08-17 ENCOUNTER — Telehealth: Payer: Self-pay | Admitting: Internal Medicine

## 2015-08-17 ENCOUNTER — Other Ambulatory Visit (INDEPENDENT_AMBULATORY_CARE_PROVIDER_SITE_OTHER): Payer: 59

## 2015-08-17 DIAGNOSIS — E876 Hypokalemia: Secondary | ICD-10-CM

## 2015-08-17 DIAGNOSIS — D582 Other hemoglobinopathies: Secondary | ICD-10-CM | POA: Diagnosis not present

## 2015-08-17 LAB — CBC WITH DIFFERENTIAL/PLATELET
BASOS PCT: 0.3 % (ref 0.0–3.0)
Basophils Absolute: 0 10*3/uL (ref 0.0–0.1)
EOS PCT: 2.5 % (ref 0.0–5.0)
Eosinophils Absolute: 0.1 10*3/uL (ref 0.0–0.7)
HCT: 30.8 % — ABNORMAL LOW (ref 36.0–46.0)
Hemoglobin: 10.1 g/dL — ABNORMAL LOW (ref 12.0–15.0)
LYMPHS ABS: 1.4 10*3/uL (ref 0.7–4.0)
Lymphocytes Relative: 32.2 % (ref 12.0–46.0)
MCHC: 32.9 g/dL (ref 30.0–36.0)
MCV: 87.5 fl (ref 78.0–100.0)
MONO ABS: 0.2 10*3/uL (ref 0.1–1.0)
MONOS PCT: 5.3 % (ref 3.0–12.0)
NEUTROS ABS: 2.5 10*3/uL (ref 1.4–7.7)
NEUTROS PCT: 59.7 % (ref 43.0–77.0)
PLATELETS: 237 10*3/uL (ref 150.0–400.0)
RBC: 3.52 Mil/uL — AB (ref 3.87–5.11)
RDW: 17.6 % — AB (ref 11.5–15.5)
WBC: 4.3 10*3/uL (ref 4.0–10.5)

## 2015-08-17 LAB — BASIC METABOLIC PANEL
BUN: 14 mg/dL (ref 6–23)
CO2: 26 meq/L (ref 19–32)
Calcium: 8.9 mg/dL (ref 8.4–10.5)
Chloride: 108 mEq/L (ref 96–112)
Creatinine, Ser: 0.59 mg/dL (ref 0.40–1.20)
GFR: 143.27 mL/min (ref >60.00)
GLUCOSE: 85 mg/dL (ref 70–99)
POTASSIUM: 3.9 meq/L (ref 3.5–5.1)
SODIUM: 137 meq/L (ref 135–145)

## 2015-08-17 NOTE — Telephone Encounter (Signed)
hgb improved Creat normal K normal  So nothing new - will keep an eye and repeat this at next ov   Recent Labs Lab 08/17/15 1121  HGB 10.1*  HCT 30.8*  WBC 4.3  PLT 237.0    Recent Labs Lab 08/17/15 1121  NA 137  K 3.9  CL 108  CO2 26  GLUCOSE 85  BUN 14  CREATININE 0.59  CALCIUM 8.9

## 2015-08-21 NOTE — Telephone Encounter (Signed)
lmtcb for pt.  

## 2015-08-22 NOTE — Telephone Encounter (Signed)
Spoke with pt. She is aware of results. Nothing further was needed.  

## 2015-08-22 NOTE — Telephone Encounter (Signed)
Pt returning call for results from elise and can be reached @ [phone].Caren Griffins

## 2015-08-23 DIAGNOSIS — K219 Gastro-esophageal reflux disease without esophagitis: Secondary | ICD-10-CM | POA: Insufficient documentation

## 2015-08-28 ENCOUNTER — Telehealth: Payer: Self-pay | Admitting: Internal Medicine

## 2015-08-28 NOTE — Telephone Encounter (Signed)
Pt calling to leave another # to be reached which is # [phone].Shannon [name]

## 2015-08-28 NOTE — Telephone Encounter (Signed)
Spoke with Robynn Pane and MR has completed forms and have been sent back to medical records  Called and spoke with the pt. Informed her of the above. She states that she called back and another message could be open giving a different number to call her at but decide the number we have on file is a better number to reach her at. She states to ignore that message. I explained that I would make sure to take it out of our box. She voiced understanding and had no further questions. Nothing further needed.

## 2015-08-28 NOTE — Telephone Encounter (Signed)
Made in error changed mind about mag.Caren Griffins

## 2015-10-12 ENCOUNTER — Ambulatory Visit (INDEPENDENT_AMBULATORY_CARE_PROVIDER_SITE_OTHER): Payer: BLUE CROSS/BLUE SHIELD | Admitting: Internal Medicine

## 2015-10-12 ENCOUNTER — Encounter: Payer: Self-pay | Admitting: Internal Medicine

## 2015-10-12 ENCOUNTER — Other Ambulatory Visit (INDEPENDENT_AMBULATORY_CARE_PROVIDER_SITE_OTHER): Payer: BLUE CROSS/BLUE SHIELD

## 2015-10-12 ENCOUNTER — Telehealth: Payer: Self-pay | Admitting: Internal Medicine

## 2015-10-12 VITALS — BP 122/84 | HR 89 | Temp 97.8°F | Ht 71.5 in | Wt 215.0 lb

## 2015-10-12 DIAGNOSIS — Z79899 Other long term (current) drug therapy: Secondary | ICD-10-CM

## 2015-10-12 DIAGNOSIS — J849 Interstitial pulmonary disease, unspecified: Secondary | ICD-10-CM

## 2015-10-12 DIAGNOSIS — E876 Hypokalemia: Secondary | ICD-10-CM

## 2015-10-12 DIAGNOSIS — J386 Stenosis of larynx: Secondary | ICD-10-CM

## 2015-10-12 LAB — BASIC METABOLIC PANEL
BUN: 10 mg/dL (ref 6–23)
CALCIUM: 9.2 mg/dL (ref 8.4–10.5)
CHLORIDE: 104 meq/L (ref 96–112)
CO2: 28 mEq/L (ref 19–32)
CREATININE: 0.7 mg/dL (ref 0.40–1.20)
GFR: 117.53 mL/min (ref >60.00)
Glucose, Bld: 100 mg/dL — ABNORMAL HIGH (ref 70–99)
Potassium: 3.3 mEq/L — ABNORMAL LOW (ref 3.5–5.1)
Sodium: 140 mEq/L (ref 135–145)

## 2015-10-12 LAB — PULMONARY FUNCTION TEST
DL/VA % PRED: 93 %
DL/VA: 5.18 ml/min/mmHg/L
DLCO unc % pred: 53 %
DLCO unc: 18.36 ml/min/mmHg
FEF 25-75 POST: 3.96 L/s
FEF 25-75 Pre: 1.84 L/sec
FEF2575-%CHANGE-POST: 114 %
FEF2575-%PRED-POST: 120 %
FEF2575-%PRED-PRE: 56 %
FEV1-%CHANGE-POST: 17 %
FEV1-%PRED-PRE: 62 %
FEV1-%Pred-Post: 73 %
FEV1-PRE: 2 L
FEV1-Post: 2.34 L
FEV1FVC-%CHANGE-POST: 11 %
FEV1FVC-%PRED-PRE: 95 %
FEV6-%Change-Post: 5 %
FEV6-%Pred-Post: 68 %
FEV6-%Pred-Pre: 65 %
FEV6-POST: 2.65 L
FEV6-Pre: 2.52 L
FEV6FVC-%Change-Post: 0 %
FEV6FVC-%PRED-POST: 102 %
FEV6FVC-%Pred-Pre: 101 %
FVC-%CHANGE-POST: 4 %
FVC-%PRED-POST: 67 %
FVC-%PRED-PRE: 64 %
FVC-POST: 2.65 L
FVC-PRE: 2.53 L
POST FEV1/FVC RATIO: 88 %
PRE FEV1/FVC RATIO: 79 %
PRE FEV6/FVC RATIO: 100 %
Post FEV6/FVC ratio: 100 %
RV % pred: 60 %
RV: 1.23 L
TLC % PRED: 62 %
TLC: 3.84 L

## 2015-10-12 LAB — CBC
HEMATOCRIT: 34 % — AB (ref 36.0–46.0)
HEMOGLOBIN: 11.1 g/dL — AB (ref 12.0–15.0)
MCHC: 32.5 g/dL (ref 30.0–36.0)
MCV: 90.9 fl (ref 78.0–100.0)
Platelets: 232 10*3/uL (ref 150.0–400.0)
RBC: 3.74 Mil/uL — ABNORMAL LOW (ref 3.87–5.11)
RDW: 15.2 % (ref 11.5–15.5)
WBC: 4.9 10*3/uL (ref 4.0–10.5)

## 2015-10-12 NOTE — Patient Instructions (Signed)
ICD-9-CM ICD-10-CM   1. ILD (interstitial lung disease) (HCC) 515 J84.9   2. High risk medication use V58.69 Z79.899   3. 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 Dallie Piles  Plan - Definitely apply for long-term disability. You  should not do mnaual work - Keep up appointment with Dr. Dallie Piles ENT in [place]  - do blood test CBC, chemistry 10/12/2015 - will call with results  Follow-up 6 months   - simple office walk test by CMA at followup (not )   - Return sooner if there are any problems

## 2015-10-12 NOTE — Progress Notes (Signed)
PFT done today. 

## 2015-10-12 NOTE — Telephone Encounter (Signed)
  lhgb improved. But K continues to be low - she should just take kcl daily - do bmet on it 1 months later   Recent Labs Lab 10/12/15 1438  HGB 11.1*  HCT 34.0*  WBC 4.9  PLT 232.0    Recent Labs Lab 10/12/15 1438  NA 140  K 3.3*  CL 104  CO2 28  GLUCOSE 100*  BUN 10  CREATININE 0.70  CALCIUM 9.2

## 2015-10-12 NOTE — Progress Notes (Signed)
Subjective:     Patient ID: Shannon [name], female   DOB: 1970/08/04, 45 y.o.   MRN: [id number]  HPI   PCP [name],[name], PA-C Referred by Dr [name]  HPI   IOV 08/23/2014  Chief Complaint  Patient presents with  . Pulmonary Consult    Pt referred by Dr. Corliss Skains for ILD. Pt c/o SOB with activity and rest, dry cough and chest tightness also with and without activity.    45 year old female referred for evaluation of autoimmune interstitial lung disease. She presents with her husband. In 2010 while living in [place] she reports she was diagnosed with lupus associated with rheumatoid arthritis in her joints. In 2012 she moved to live in [facility] and several months after that started noticing insidious onset of shortness of breath. Local rheumatologist diagnosed her with interstitial lung disease. She was referred to Dr. Herma Carson at [facility] in [facility] and was started on CellCept/prednisone for autoimmune interstitial lung disease he and however, sometime around 2 years ago she ran out of medical insurance and stopped taking these medications. During this time her dyspnea has progressed. It is currently rated as moderate to severe. It is present on exertion and relieved by rest. Even minimal amount of exertion makes her very dyspneic. Now she has insurance and she did see Dr. Frederik Pear locally and she has autoimmune panel lab ordered. In addition exam revealed crackles and there for she's been referred here for reevaluation of interstitial lung disease and dyspnea. Dyspnea is associated with some chest tightness but no chest pain. This no associated dizzine   11/24/2014 Follow UP ILD  [name]  She has autoimmune ILD with RA/Lupus  She was seen 6 weeks ago, restarted on Cellcept.  Previously on cellcept but lost her insurance until recently.  On low dose prednisone 5mg  daily .  Last CT chest 4/4 showed ILD changes similar to 2013. Echo  was ok with EFG 55-60%, nml PAP . In March .   Did not take dapsone ,due to  GP6D deficiency.  Labs ok last week with nml LFT , no sign change in hbg. /wbc.  She is feeling better. Does feel her breathing is some better.  No flare of cough or wheezing.     OV 01/16/2015  Chief Complaint  Patient presents with  . Follow-up    Pt c/o DOE, mild dry cough, and chest tightness when SOB. Pt states the chest tightness has improved).    follow-up interstitial lung disease in the setting of rheumatoid arthritis Follow-up high risk medication use - on CellCept and prednisone since mid April 2016  - Presents with her husband. Both give a history. Overall she is doing better in terms of dyspnea after starting CellCept and prednisone. However the improvement this only moderate. She still left with a residual moderate dyspnea on exertion that is also made worse with bending or heart air and improved with rest and cool air. It is associated with some cough and wheezing. She takes albuterol inhaler which she feels helps only somewhat but not fully and not quickly enough. She is frustrated by this. In addition she's complaining of some associated right lower back paraspinal spasm for which massage gives her relief. He has never attended pulmonary rehabilitation.  - Lab review shows she has had problems with potassium and has had potassium supplementation. Last lab check was 12/08/2014. She is due for lab test right now   OV 03/28/2015  Chief Complaint  Patient presents with  . Follow-up    3 month follow up. Pt states that she is still having some problems with her breathing. Pt c/o of feeling chest tightness, chest pain and cough that is dry. Pt denies wheeze. Pt states that she did trial the Advair and does feel that it helped some.   Follow-up interstitial lung disease secondary to autoimmune process and associated dyspnea that seems out of proportion\  She presents with her husband. She continues  on CellCept and prednisone. In the last visit approximately 2-3 months ago she had out of proportion dyspnea. I gave her some Advair to try. She says this only helped a little bit. Overall she says that dyspnea still persists. It is worse than in the spring 2016 when she started CellCept and prednisone. It is stable since July 2016. It is moderate in intensity. Occurs randomly at rest but also with exertion. Occasionally relieved by rest but also happens at rest. Heat and humidity make it worse. Advair helped a little bit only. This no associated chest tightness or wheezing. She did try and enroll  in pulmonary rehabilitation but could not afford the the startup program and is waiting to hear from them for the maintenance program. She is frustrated by all the symptoms.  Pulmonary function test October 15,016 today FVC 2.55 L/64%, total lung capacity 4 L/65% and diffusion 14.5/42%. Overall no change since April 2016   OV 07/04/2015  Chief Complaint  Patient presents with  . Follow-up    pt. states breathing is baseline. SOB. dry cough. wheezing. occ. chest pain/tightness. feels her airway is blocked.   Follow-up interstitial lung disease admitted autoimmune processes and associated out of proportion dyspnea  She presents again with her husband. She continues on CellCept 2 g twice daily associated with prednisone. She cannot take Bactrim or dapsone due to G6PD deficiency. Last seen in the fall of 2016. She was having out of proportion dyspnea. Rated cardia pulmonary stress test and she could not tolerate this test. She then underwent right heart catheterization mid-November 2016 with Dr. Marca Ancona. Review the tests this is normal. Overall stable but she and husband still continued to complain about this resting dyspnea associated with wheezing. They hear noises in the chest. Sometime she gasps for air even at rest. She feels it comes from the chest but the husband points to the throat. I offered  second opinion at [facility] because of this unusual symptoms but they declined citing distance. She was supposed to attend pulmonary rehabilitation but they cannot afford a $60 co-pay twice a week 8 weeks. Offered ENT evaluation that agreeable but they wanted done in  which is closer logistically. In addition patient is contemplating now applying for disability. 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  Walking desaturation test 185 feet 3 laps and rheumatic: No desaturation    OV 10/12/2015  Chief Complaint  Patient presents with  . Follow-up    PFT today.  breathing is better.  Pt is currently on STD, [employer] approved STD until today, wants to know today if pt is able to return to work, any restrictions.  Pt states that she has been more stable while out of work.      Follow-up interstitial lung disease due to autoimmune processes not otherwise specified  Last seen January 2017. Since then she has been on short-term disability and out of work. She does heavy manual work. The lack of work estimated  dyspnea better. She did pulmonary function test today that I personally visualized and overall it is same. There are no new issues. She is on CellCept and she is tolerating this fine. Last visit she had some anemia we follow this up and the anemia was better. Also hypokalemia resolved by February 2017. She is due for blood work today. 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 This showed up at last visit. She saw an ENT in [place] within referred her to [facility]. She does not want to go to [facility]. She ended up seeing Dr. Ezzard Standing locally. But now she is going to see Dr.  Dina Rich In [facility]   Pulmonary function test today 10/12/2015 shows postbronchodilator FVC 2.65 L/67%. FEV1 2.34 L/72% which is up 17% positive bronchodilator response. Ratio of 80/106%. Total  lung capacity of 3.8/62%. DLCO 18.36/52%. Overall consistent with restriction and lung parenchymal disease. Overall PFTs are stable compared to April 2016 but perhaps in DLCO slightly better    has a past medical history of Rheumatoid arthritis (HCC); Lupus (HCC); and G6PD deficiency (HCC).   reports that she quit smoking about 12 months ago. Her smoking use included Cigarettes. She has a 7 pack-year smoking history. She has never used smokeless tobacco.  Past Surgical History  Procedure Laterality Date  . Cesarean section      X 2  . Cholecystectomy    . Tubal ligation    . Appendectomy    . Cardiac catheterization N/A 05/10/2015    Procedure: Right Heart Cath;  Surgeon: Laurey Morale, MD;  Location: [facility] INVASIVE CV LAB;  Service: Cardiovascular;  Laterality: N/A;    Allergies  Allergen Reactions  . Asa [Aspirin]     HIves  . Sulfa Antibiotics     G6PD deficiency     Immunization History  Administered Date(s) Administered  . Influenza Split 03/23/2014  . Pneumococcal-Unspecified 08/23/2014    Family History  Problem Relation Age of Onset  . Sarcoidosis Mother      Current outpatient prescriptions:  .  albuterol (PROVENTIL HFA;VENTOLIN HFA) 108 (90 BASE) MCG/ACT inhaler, Inhale 2 puffs into the lungs every 6 (six) hours as needed for wheezing or shortness of breath., Disp: , Rfl:  .  diclofenac sodium (VOLTAREN) 1 % GEL, Apply 2 g topically 4 (four) times daily., Disp: , Rfl:  .  ferrous sulfate 325 (65 FE) MG tablet, Take by mouth., Disp: , Rfl:  .  ibuprofen (ADVIL,MOTRIN) 200 MG tablet, Take 200 mg by mouth every 6 (six) hours as needed for moderate pain. , Disp: , Rfl:  .  mycophenolate (CELLCEPT) 500 MG tablet, Take 2 tablets (1,000 mg total) by mouth 2 (two) times daily., Disp: 120 tablet, Rfl: 5 .  predniSONE (DELTASONE) 10 MG tablet, Take 10 mg by mouth daily with breakfast. , Disp: , Rfl:     Review of Systems     Objective:   Physical Exam  Filed  Vitals:   10/12/15 1356  BP: 122/84  Pulse: 89  Temp: 97.8 F (36.6 C)  TempSrc: Oral  Height: 5' 11.5" (1.816 m)  Weight: 215 lb (97.523 kg)  SpO2: 98%        Assessment:       ICD-9-CM ICD-10-CM   1. ILD (interstitial lung disease) (HCC) 515 J84.9   2. 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 Dallie Piles  Plan - Definitely apply for long-term disability. You  should not do mnaual work - Keep up appointment with Dr. Dallie Piles ENT in [place]  - do blood test CBC, chemistry 10/12/2015 - will call with results  Follow-up 6 months   - simple office walk test by CMA at followup (not )   - Return sooner if there are any problems > 50% of this > 25 min visit spent in face to face counseling or coordination of care    Dr. Kalman Shan, M.D., [facility].C.P Pulmonary and Critical Care Medicine Staff Physician [facility] Pager: [phone], If no answer or between  15:00h - 7:00h: call [phone]  10/12/2015 2:22 PM

## 2015-10-15 ENCOUNTER — Telehealth: Payer: Self-pay | Admitting: Internal Medicine

## 2015-10-15 MED ORDER — POTASSIUM CHLORIDE 20 MEQ PO PACK: 20.0000 meq | PACK | Freq: Every day | ORAL | Status: DC

## 2015-10-15 MED ORDER — ALBUTEROL SULFATE HFA 108 (90 BASE) MCG/ACT IN AERS: 2.0000 | INHALATION_SPRAY | Freq: Four times a day (QID) | RESPIRATORY_TRACT | Status: DC | PRN

## 2015-10-15 NOTE — Telephone Encounter (Signed)
Called and spoke to pt. Informed her of the results and recs per MR. Order placed and rx sent to preferred pharmacy. Pt verbalized understanding and denied any further questions or concerns at this time.

## 2015-10-15 NOTE — Telephone Encounter (Signed)
Called and spoke to [facility] with [employer]. Tammy states potassium was sent in as the powder form but pt can tolerate the tablet. Advised her that we can change her to the tablet form. VO given. Tammy verbalized understanding and denied any further questions or concerns at this time.

## 2015-10-22 HISTORY — PX: LAPAROSCOPIC HYSTERECTOMY: SHX1926

## 2015-11-02 ENCOUNTER — Telehealth: Payer: Self-pay | Admitting: Internal Medicine

## 2015-11-02 NOTE — Telephone Encounter (Signed)
Spoke with pt. She is scheduled to have a total hysterectomy next Friday. Her last OV note and PFT results need to be faxed to her OB/GYN in [place]. Pt does not have their fax number and their office is closed for the day. Pt will obtain the fax number and call us back on Monday with it.

## 2015-11-05 NOTE — Telephone Encounter (Signed)
[phone] Pt  cb, please fax to [phone], attention French Ana

## 2015-11-05 NOTE — Telephone Encounter (Signed)
Pt aware that records will be faxed to fax # given.  OV and PFT results have been faxed as surgical clearance ATTN: French Ana Nothing further needed.

## 2015-12-06 ENCOUNTER — Telehealth: Payer: Self-pay | Admitting: Internal Medicine

## 2015-12-06 NOTE — Telephone Encounter (Signed)
Spoke with pharmacist, requesting VO for cellcept.  This was given.  Nothing further needed.

## 2015-12-18 ENCOUNTER — Encounter: Payer: Self-pay | Admitting: *Deleted

## 2015-12-19 ENCOUNTER — Other Ambulatory Visit: Payer: Self-pay | Admitting: Diagnostic Neuroimaging

## 2015-12-19 ENCOUNTER — Ambulatory Visit (INDEPENDENT_AMBULATORY_CARE_PROVIDER_SITE_OTHER): Payer: BLUE CROSS/BLUE SHIELD | Admitting: Diagnostic Neuroimaging

## 2015-12-19 ENCOUNTER — Encounter: Payer: Self-pay | Admitting: Diagnostic Neuroimaging

## 2015-12-19 VITALS — BP 126/91 | HR 82 | Ht 71.0 in | Wt 223.6 lb

## 2015-12-19 DIAGNOSIS — R413 Other amnesia: Secondary | ICD-10-CM | POA: Diagnosis not present

## 2015-12-19 NOTE — Patient Instructions (Signed)
Thank you for coming to see [place] at [facility]. I hope we have been able to provide you high quality care today.  You may receive a patient satisfaction survey over the next few weeks. We would appreciate your feedback and comments so that we may continue to improve ourselves and the health of our patients.  - I will check MRI brain, labs - I will setup neuropsychology testing   ~~~~~~~~~~~~~~~~~~~~~~~~~~~~~~~~~~~~~~~~~~~~~~~~~~~~~~~~~~~~~~~~~  DR. Lola Lofaro'S GUIDE TO HAPPY AND HEALTHY LIVING These are some of my general health and wellness recommendations. Some of them may apply to you better than others. Please use common sense as you try these suggestions and feel free to ask me any questions.   ACTIVITY/FITNESS Mental, social, emotional and physical stimulation are very important for brain and body health. Try learning a new activity (arts, music, language, sports, games).  Keep moving your body to the best of your abilities. You can do this at home, inside or outside, the park, community center, gym or anywhere you like. Consider a physical therapist or personal trainer to get started. Consider the app Sworkit. Fitness trackers such as smart-watches, smart-phones or Fitbits can help as well.   NUTRITION Eat more plants: colorful vegetables, nuts, seeds and berries.  Eat less sugar, salt, preservatives and processed foods.  Avoid toxins such as cigarettes and alcohol.  Drink water when you are thirsty. Warm water with a slice of lemon is an excellent morning drink to start the day.  Consider these websites for more information The Nutrition Source (https://www.henry-hernandez.biz/) Precision Nutrition (WindowBlog.ch)   RELAXATION Consider practicing mindfulness meditation or other relaxation techniques such as deep breathing, prayer, yoga, tai chi, massage. See website mindful.org or the apps Headspace or Calm to  help get started.   SLEEP Try to get at least 7-8+ hours sleep per day. Regular exercise and reduced caffeine will help you sleep better. Practice good sleep hygeine techniques. See website sleep.org for more information.   PLANNING Prepare estate planning, living will, healthcare POA documents. Sometimes this is best planned with the help of an attorney. Theconversationproject.org and agingwithdignity.org are excellent resources.

## 2015-12-19 NOTE — Progress Notes (Signed)
[facility]  PATIENT: Shannon [name] DOB: September 10, 1970  REFERRING CLINICIAN: Janalyn Rouse Deveshwar HISTORY FROM: patient  REASON FOR VISIT: new consult   HISTORICAL  CHIEF COMPLAINT:  Chief Complaint  Patient presents with  . Memory Loss    rm 7, New Pt, MMSE 27, " forget where I'm going, forget I'm cooking and leave kitchen to lie down, forget what I'm saying, past 6-7 mos"    HISTORY OF PRESENT ILLNESS:   45 year old left-handed female here for evaluation of memory loss. Patient has history of lupus diagnosed 2010. Patient has history of rheumatoid arthritis, Raynaud's phenomenon, interstitial lung disease, G6PD deficiency, anemia. Patient under treatment of rheumatologist and pulmonologist with immune suppressant therapy.  For past 6-7 months patient has been having increasing problems with short-term memory loss, forgetting tasks, forgetting navigation, forgetting words. Sometimes she has difficulty with cooking. Patient started to use a notebook to get better organized and this has helped. She is able to take notes, follow instructions and function better.  Patient has chronic pain 5 out of 10 severity with increasing pain in knees lately. Patient also has chronic insomnia, racing thoughts, anxiety which has been going on for at least one year.  Patient had been having trouble with swallowing and globus sensation, went to ENT, who diagnosed possible anxiety and treated patient with Valium. Patient has seen psychiatrist in the past for one visit but did not get along and then did not follow-up.   REVIEW OF SYSTEMS: Full 14 system review of systems performed and negative with exception of: Weight gain chest pain swelling in legs shortness of breath wheezing feeling cold anemia joint pain skin sensitivity anxiety racing thoughts insomnia numbness memory loss.    ALLERGIES: Allergies  Allergen Reactions  . Asa [Aspirin]     HIves  . Penicillins Hives  . Sulfa  Antibiotics     G6PD deficiency     HOME MEDICATIONS: Outpatient Prescriptions Prior to Visit  Medication Sig Dispense Refill  . albuterol (PROVENTIL HFA;VENTOLIN HFA) 108 (90 Base) MCG/ACT inhaler Inhale 2 puffs into the lungs every 6 (six) hours as needed for wheezing or shortness of breath. 1 Inhaler 5  . diclofenac sodium (VOLTAREN) 1 % GEL Apply 2 g topically 4 (four) times daily.    . ferrous sulfate 325 (65 FE) MG tablet Take by mouth.    Marland Kitchen ibuprofen (ADVIL,MOTRIN) 200 MG tablet Take 200 mg by mouth every 6 (six) hours as needed for moderate pain.     . mycophenolate (CELLCEPT) 500 MG tablet Take 2 tablets (1,000 mg total) by mouth 2 (two) times daily. 120 tablet 5  . potassium chloride (KLOR-CON) 20 MEQ packet Take 20 mEq by mouth daily. 30 tablet 0  . predniSONE (DELTASONE) 10 MG tablet Take 10 mg by mouth daily with breakfast.      No facility-administered medications prior to visit.    PAST MEDICAL HISTORY: Past Medical History  Diagnosis Date  . Rheumatoid arthritis (HCC)   . Lupus (HCC)   . G6PD deficiency (HCC)   . ILD (interstitial lung disease) (HCC)   . Anemia     PAST SURGICAL HISTORY: Past Surgical History  Procedure Laterality Date  . Cesarean section  '95, '02, '07    X 3  . Cholecystectomy  2010  . Tubal ligation    . Appendectomy  1980  . Cardiac catheterization N/A 05/10/2015    Procedure: Right Heart Cath;  Surgeon: Laurey Morale, MD;  Location: [facility] INVASIVE  CV LAB;  Service: Cardiovascular;  Laterality: N/A;  . Laparoscopic hysterectomy  10/2015    have ovaries    FAMILY HISTORY: Family History  Problem Relation Age of Onset  . Sarcoidosis Mother   . Lupus Sister     SOCIAL HISTORY:  Social History   Social History  . Marital Status: Married    Spouse Name: [name]  . Number of Children: 3  . Years of Education: 12   Occupational History  . [employer]    12/19/15 applying for SS   Social History Main Topics  . Smoking  status: Former Smoker -- 0.50 packs/day for 14 years    Types: Cigarettes    Quit date: 09/25/2014  . Smokeless tobacco: Never Used  . Alcohol Use: No  . Drug Use: No  . Sexual Activity: Not on file   Other Topics Concern  . Not on file   Social History Narrative   Married, lives with husband, children   Caffeine- coffee  1 daily     PHYSICAL EXAM  GENERAL EXAM/CONSTITUTIONAL: Vitals:  Filed Vitals:   12/19/15 1057  BP: 126/91  Pulse: 82  Height: 5\' 11"  (1.803 m)  Weight: 223 lb 9.6 oz (101.424 kg)     Body mass index is 31.2 kg/(m^2).  Vision Screening Comments: 12/19/15 didn't wear contacts, unable to complete  Patient is in no distress; well developed, nourished and groomed; neck is supple  CARDIOVASCULAR:  Examination of carotid arteries is normal; no carotid bruits  Regular rate and rhythm, no murmurs  Examination of peripheral vascular system by observation and palpation is normal  EYES:  Ophthalmoscopic exam of optic discs and posterior segments is normal; no papilledema or hemorrhages  MUSCULOSKELETAL:  Gait, strength, tone, movements noted in Neurologic exam below  NEUROLOGIC: MENTAL STATUS:  MMSE - Mini Mental State Exam 12/19/2015  Orientation to time 5  Orientation to Place 4  Registration 3  Attention/ Calculation 4  Recall 3  Language- name 2 objects 2  Language- repeat 1  Language- follow 3 step command 3  Language- read & follow direction 1  Write a sentence 1  Copy design 0  Total score 27    awake, alert, oriented to person, place and time  recent and remote memory intact  normal attention and concentration  language fluent, comprehension intact, naming intact,   fund of knowledge appropriate  CRANIAL NERVE:   2nd - no papilledema on fundoscopic exam  2nd, 3rd, 4th, 6th - pupils equal and reactive to light, visual fields full to confrontation, extraocular muscles intact, no nystagmus  5th - facial sensation  symmetric  7th - facial strength symmetric  8th - hearing intact  9th - palate elevates symmetrically, uvula midline  11th - shoulder shrug symmetric  12th - tongue protrusion midline  MOTOR:   normal bulk and tone, full strength in the BUE, BLE  SENSORY:   normal and symmetric to light touch, temperature, vibration  COORDINATION:   finger-nose-finger, fine finger movements normal  REFLEXES:   deep tendon reflexes TRACE and symmetric  GAIT/STATION:   narrow based gait; TANDEM STABLE    DIAGNOSTIC DATA (LABS, IMAGING, TESTING) - I reviewed patient records, labs, notes, testing and imaging myself where available.  Lab Results  Component Value Date   WBC 4.9 10/12/2015   HGB 11.1* 10/12/2015   HCT 34.0* 10/12/2015   MCV 90.9 10/12/2015   PLT 232.0 10/12/2015      Component Value Date/Time   NA  140 10/12/2015 1438   K 3.3* 10/12/2015 1438   CL 104 10/12/2015 1438   CO2 28 10/12/2015 1438   GLUCOSE 100* 10/12/2015 1438   BUN 10 10/12/2015 1438   CREATININE 0.70 10/12/2015 1438   CALCIUM 9.2 10/12/2015 1438   PROT 7.6 07/04/2015 1701   ALBUMIN 3.8 07/04/2015 1701   AST 14 07/04/2015 1701   ALT 8 07/04/2015 1701   ALKPHOS 65 07/04/2015 1701   BILITOT 0.2 07/04/2015 1701   GFRNONAA >60 05/10/2015 1215   GFRAA >60 05/10/2015 1215   No results found for: CHOL, HDL, LDLCALC, LDLDIRECT, TRIG, CHOLHDL No results found for: SFSE3T No results found for: VITAMINB12 No results found for: TSH   09/25/14 CT chest [I reviewed images myself and agree with interpretation. -VRP]  1. The appearance of the lungs is compatible with interstitial lung disease, and the overall appearance is very similar to remote prior study 04/01/2012. 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 The possibility of early usual interstitial pneumonia (UIP) is not excluded,  particularly given the craniocaudal gradient and possible areas of honeycombing, however, lack of progression compared to the prior studies suggests against UIP, unless the patient has been undergoing active and highly effective clinical and pharmacologic management. 2. Pulmonic trunk is normal in size measuring 2.8 cm in diameter.  03/17/15 MRI lumbar spine [I reviewed images myself and agree with interpretation. -VRP]  - negative for degenerative disease  05/10/15 EKG [I reviewed images myself and agree with interpretation. -VRP]  - Normal sinus rhythm - Possible Left atrial enlargement - Incomplete right bundle branch block - Borderline ECG - No significant change since last tracing     ASSESSMENT AND PLAN  45 y.o. year old female here with lupus, rheumatoid others, interstitial lung disease, G6PD deficiency, Raynaud's phenomenon, here with 6-7 month history of memory loss, as well as one year history of insomnia, anxiety, racing thoughts. Memory dysfunction is likely related to underlying anxiety, chronic pain, insomnia issues. We'll check MRI brain and labs to rule out other secondary causes.   Ddx: CNS autoimmune/inflamm (neuro-psychiatric lupus), metabolic vs anxiety associated cognitive dysfunction  1. Memory loss     PLAN:  - I will check MRI brain, labs - I will setup neuropsychology testing - continue autoimmune disease modifying therapy (per Dr. Corliss Skains and Dr. Marchelle Gearing)  - consider psychiatry/psychology evaluation for anxiety  Orders Placed This Encounter  Procedures  . MR Brain W Wo Contrast  . Vitamin B12  . TSH  . Hemoglobin A1c  . Ambulatory referral to Neuropsychology   Return in about 2 months (around 02/18/2016).  I reviewed images, labs, notes, records myself. I summarized findings and reviewed with patient, for this moderate risk condition (memory loss, lupus) requiring high complexity decision making.     Suanne Marker, MD 12/19/2015, 11:54  AM Certified in Neurology, Neurophysiology and Neuroimaging  [facility] [address] [place], [place] [address] [phone]

## 2015-12-20 ENCOUNTER — Telehealth: Payer: Self-pay | Admitting: *Deleted

## 2015-12-20 LAB — HEMOGLOBIN A1C
ESTIMATED AVERAGE GLUCOSE: 94 mg/dL
HEMOGLOBIN A1C: 4.9 % (ref 4.8–5.6)

## 2015-12-20 LAB — TSH: TSH: 1.87 u[IU]/mL (ref 0.450–4.500)

## 2015-12-20 LAB — VITAMIN B12: VITAMIN B 12: 300 pg/mL (ref 211–946)

## 2015-12-20 NOTE — Telephone Encounter (Signed)
LVM informing patient her labs results are normal. Left name, number for any questions.

## 2016-01-13 ENCOUNTER — Inpatient Hospital Stay: Admission: RE | Admit: 2016-01-13 | Payer: BLUE CROSS/BLUE SHIELD | Source: Ambulatory Visit

## 2016-02-18 ENCOUNTER — Ambulatory Visit: Payer: BLUE CROSS/BLUE SHIELD | Admitting: Diagnostic Neuroimaging

## 2016-03-09 ENCOUNTER — Inpatient Hospital Stay: Admission: RE | Admit: 2016-03-09 | Payer: BLUE CROSS/BLUE SHIELD | Source: Ambulatory Visit

## 2016-03-10 ENCOUNTER — Telehealth: Payer: Self-pay | Admitting: Diagnostic Neuroimaging

## 2016-03-10 DIAGNOSIS — R413 Other amnesia: Secondary | ICD-10-CM

## 2016-03-10 NOTE — Telephone Encounter (Signed)
Pt called in wanting to know where she stood with her PA for MRI. Please call and advise [phone]

## 2016-03-16 ENCOUNTER — Inpatient Hospital Stay: Admission: RE | Admit: 2016-03-16 | Payer: 59 | Source: Ambulatory Visit

## 2016-03-17 NOTE — Telephone Encounter (Signed)
Patient called requesting to speak with nurse regarding MRI, states insurance won't approve because needs more information from Dr. Marjory Lies and no one has called them back. Please call [phone].

## 2016-03-18 NOTE — Telephone Encounter (Signed)
Called and spoke with the patient. She told me that she spoke with insurance and they told her they were waiting on something from our office. They gave her the phone number of  [phone]. Called insurance, case has expired because the P2P was not completed. I will begin a new request.   NEW ENTRY 03/24/16- Spoke with patient who was calling to check status of MRI. She is requesting a call once we know something.

## 2016-03-24 NOTE — Telephone Encounter (Addendum)
Shannon [name]/[employer] member services called.  She did not fully understand why she was calling. She said something about radiology did not have something. She said Shannon [name] could call the member

## 2016-03-25 NOTE — Telephone Encounter (Signed)
Insurance is willing to authorize this patient for an MRI wo instead of the w/wo, would you like to change the order? The original request MRI Brain w/wo was denied and required a p2p that was never completed.   Authorization-[id number]

## 2016-03-25 NOTE — Addendum Note (Signed)
Addended byJoycelyn Schmid on: 03/25/2016 06:23 PM   Modules accepted: Orders

## 2016-03-25 NOTE — Telephone Encounter (Signed)
Patient called requesting to speak with [facility] regarding MRI, please call [phone].

## 2016-03-30 ENCOUNTER — Ambulatory Visit
Admission: RE | Admit: 2016-03-30 | Discharge: 2016-03-30 | Disposition: A | Discharge status: Home / Self Care | Payer: 59 | Source: Ambulatory Visit | Attending: Diagnostic Neuroimaging | Admitting: Diagnostic Neuroimaging

## 2016-03-30 DIAGNOSIS — R413 Other amnesia: Secondary | ICD-10-CM

## 2016-04-14 ENCOUNTER — Encounter: Payer: Self-pay | Admitting: Diagnostic Neuroimaging

## 2016-04-14 ENCOUNTER — Ambulatory Visit (INDEPENDENT_AMBULATORY_CARE_PROVIDER_SITE_OTHER): Payer: 59 | Admitting: Diagnostic Neuroimaging

## 2016-04-14 VITALS — BP 130/92 | HR 82 | Wt 236.2 lb

## 2016-04-14 DIAGNOSIS — F430 Acute stress reaction: Secondary | ICD-10-CM | POA: Diagnosis not present

## 2016-04-14 DIAGNOSIS — R413 Other amnesia: Secondary | ICD-10-CM

## 2016-04-14 DIAGNOSIS — F411 Generalized anxiety disorder: Secondary | ICD-10-CM

## 2016-04-14 DIAGNOSIS — G4700 Insomnia, unspecified: Secondary | ICD-10-CM

## 2016-04-14 NOTE — Patient Instructions (Signed)
-   continue autoimmune disease modifying therapy (per Dr. Corliss Skains and Dr. Marchelle Gearing)   - consider psychiatry/psychology evaluation for anxiety and insomnia  - gradually increase physical activity  - try mindfulness therapy and visualization techniques

## 2016-04-14 NOTE — Progress Notes (Signed)
[facility]  PATIENT: Shannon [name] DOB: [birth year]  REFERRING CLINICIAN: Janalyn Rouse Deveshwar HISTORY FROM: patient  REASON FOR VISIT: follow up    HISTORICAL  CHIEF COMPLAINT:  Chief Complaint  Patient presents with  . Memory Loss    rm 6, MMSE 26  . Follow-up    HISTORY OF PRESENT ILLNESS:   UPDATE 04/14/16: Since last visit, symptoms of memory loss continue. Anxiety and pain and sleep are getting worse.   PRIOR HPI (12/19/15): 45 year old left-handed female here for evaluation of memory loss. Patient has history of lupus diagnosed 2010. Patient has history of rheumatoid arthritis, Raynaud's phenomenon, interstitial lung disease, G6PD deficiency, anemia. Patient under treatment of rheumatologist and pulmonologist with immune suppressant therapy. For past 6-7 months patient has been having increasing problems with short-term memory loss, forgetting tasks, forgetting navigation, forgetting words. Sometimes she has difficulty with cooking. Patient started to use a notebook to get better organized and this has helped. She is able to take notes, follow instructions and function better. Patient has chronic pain 5 out of 10 severity with increasing pain in knees lately. Patient also has chronic insomnia, racing thoughts, anxiety which has been going on for at least one year. Patient had been having trouble with swallowing and globus sensation, went to ENT, who diagnosed possible anxiety and treated patient with Valium. Patient has seen psychiatrist in the past for one visit but did not get along and then did not follow-up.   REVIEW OF SYSTEMS: Full 14 system review of systems performed and negative with exception of: blurred vision shortness cough depression anxiety    ALLERGIES: Allergies  Allergen Reactions  . Asa [Aspirin]     HIves  . Penicillins Hives  . Sulfa Antibiotics     G6PD deficiency     HOME MEDICATIONS: Outpatient Medications Prior to Visit    Medication Sig Dispense Refill  . albuterol (PROVENTIL HFA;VENTOLIN HFA) 108 (90 Base) MCG/ACT inhaler Inhale 2 puffs into the lungs every 6 (six) hours as needed for wheezing or shortness of breath. 1 Inhaler 5  . diclofenac sodium (VOLTAREN) 1 % GEL Apply 2 g topically 4 (four) times daily.    . ferrous sulfate 325 (65 FE) MG tablet Take by mouth.    Marland Kitchen ibuprofen (ADVIL,MOTRIN) 200 MG tablet Take 200 mg by mouth every 6 (six) hours as needed for moderate pain.     . mycophenolate (CELLCEPT) 500 MG tablet Take 2 tablets (1,000 mg total) by mouth 2 (two) times daily. 120 tablet 5  . predniSONE (DELTASONE) 10 MG tablet Take 10 mg by mouth daily with breakfast.     . diazepam (VALIUM) 2 MG tablet Take 2 mg by mouth. As needed    . oxyCODONE (OXY IR/ROXICODONE) 5 MG immediate release tablet Take 5 mg by mouth. As needed    . potassium chloride (KLOR-CON) 20 MEQ packet Take 20 mEq by mouth daily. 30 tablet 0   No facility-administered medications prior to visit.     PAST MEDICAL HISTORY: Past Medical History:  Diagnosis Date  . Anemia   . G6PD deficiency (HCC)   . ILD (interstitial lung disease) (HCC)   . Lupus   . Rheumatoid arthritis (HCC)     PAST SURGICAL HISTORY: Past Surgical History:  Procedure Laterality Date  . APPENDECTOMY  1980  . CARDIAC CATHETERIZATION N/A 05/10/2015   Procedure: Right Heart Cath;  Surgeon: Laurey Morale, MD;  Location: [facility] INVASIVE CV LAB;  Service: Cardiovascular;  Laterality:  N/A;  . CESAREAN SECTION  '95, '02, '07   X 3  . CHOLECYSTECTOMY  2010  . LAPAROSCOPIC HYSTERECTOMY  10/2015   have ovaries  . TUBAL LIGATION      FAMILY HISTORY: Family History  Problem Relation Age of Onset  . Sarcoidosis Mother   . Lupus Sister     SOCIAL HISTORY:  Social History   Social History  . Marital status: Married    Spouse name: [name]  . Number of children: 3  . Years of education: 12   Occupational History  . [employer]    12/19/15  applying for SS   Social History Main Topics  . Smoking status: Former Smoker    Packs/day: 0.50    Years: 14.00    Types: Cigarettes    Quit date: 09/25/2014  . Smokeless tobacco: Never Used  . Alcohol use No  . Drug use: No  . Sexual activity: Not on file   Other Topics Concern  . Not on file   Social History Narrative   Married, lives with husband, children   Caffeine- coffee  1 daily     PHYSICAL EXAM  GENERAL EXAM/CONSTITUTIONAL: Vitals:  Vitals:   04/14/16 1353  BP: (!) 130/92  Pulse: 82  Weight: 236 lb 3.2 oz (107.1 kg)   Body mass index is 32.94 kg/m. No exam data present  Patient is in no distress; well developed, nourished and groomed; neck is supple  CARDIOVASCULAR:  Examination of carotid arteries is normal; no carotid bruits  Regular rate and rhythm, no murmurs  Examination of peripheral vascular system by observation and palpation is normal  EYES:  Ophthalmoscopic exam of optic discs and posterior segments is normal; no papilledema or hemorrhages  MUSCULOSKELETAL:  Gait, strength, tone, movements noted in Neurologic exam below  NEUROLOGIC: MENTAL STATUS:  MMSE - Mini Mental State Exam 04/14/2016 12/19/2015  Orientation to time 5 5  Orientation to Place 4 4  Registration 3 3  Attention/ Calculation 3 4  Recall 3 3  Language- name 2 objects 2 2  Language- repeat 0 1  Language- follow 3 step command 3 3  Language- read & follow direction 1 1  Write a sentence 1 1  Copy design 1 0  Total score 26 27    awake, alert, oriented to person, place and time  recent and remote memory intact  normal attention and concentration  language fluent, comprehension intact, naming intact,   fund of knowledge appropriate  CRANIAL NERVE:   2nd - no papilledema on fundoscopic exam  2nd, 3rd, 4th, 6th - pupils equal and reactive to light, visual fields full to confrontation, extraocular muscles intact, no nystagmus  5th - facial sensation  symmetric  7th - facial strength symmetric  8th - hearing intact  9th - palate elevates symmetrically, uvula midline  11th - shoulder shrug symmetric  12th - tongue protrusion midline  MOTOR:   normal bulk and tone, full strength in the BUE, BLE  SENSORY:   normal and symmetric to light touch, temperature, vibration  COORDINATION:   finger-nose-finger, fine finger movements normal  REFLEXES:   deep tendon reflexes TRACE and symmetric  GAIT/STATION:   narrow based gait; TANDEM STABLE    DIAGNOSTIC DATA (LABS, IMAGING, TESTING) - I reviewed patient records, labs, notes, testing and imaging myself where available.  Lab Results  Component Value Date   WBC 4.9 10/12/2015   HGB 11.1 (L) 10/12/2015   HCT 34.0 (L) 10/12/2015  MCV 90.9 10/12/2015   PLT 232.0 10/12/2015      Component Value Date/Time   NA 140 10/12/2015 1438   K 3.3 (L) 10/12/2015 1438   CL 104 10/12/2015 1438   CO2 28 10/12/2015 1438   GLUCOSE 100 (H) 10/12/2015 1438   BUN 10 10/12/2015 1438   CREATININE 0.70 10/12/2015 1438   CALCIUM 9.2 10/12/2015 1438   PROT 7.6 07/04/2015 1701   ALBUMIN 3.8 07/04/2015 1701   AST 14 07/04/2015 1701   ALT 8 07/04/2015 1701   ALKPHOS 65 07/04/2015 1701   BILITOT 0.2 07/04/2015 1701   GFRNONAA >60 05/10/2015 1215   GFRAA >60 05/10/2015 1215   No results found for: CHOL, HDL, LDLCALC, LDLDIRECT, TRIG, CHOLHDL Lab Results  Component Value Date   HGBA1C 4.9 12/19/2015   Lab Results  Component Value Date   VITAMINB12 300 12/19/2015   Lab Results  Component Value Date   TSH 1.870 12/19/2015     09/25/14 CT chest [I reviewed images myself and agree with interpretation. -VRP]  1. The appearance of the lungs is compatible with interstitial lung disease, and the overall appearance is very similar to remote prior study 04/01/2012. Given the spectrum of findings and relative stability compared to the prior study, this is favored to reflect fibrotic phase  nonspecific interstitial pneumonia (NSIP). The possibility of early usual interstitial pneumonia (UIP) is not excluded, particularly given the craniocaudal gradient and possible areas of honeycombing, however, lack of progression compared to the prior studies suggests against UIP, unless the patient has been undergoing active and highly effective clinical and pharmacologic management. 2. Pulmonic trunk is normal in size measuring 2.8 cm in diameter.  03/17/15 MRI lumbar spine [I reviewed images myself and agree with interpretation. -VRP]  - negative for degenerative disease  05/10/15 EKG [I reviewed images myself and agree with interpretation. -VRP]  - Normal sinus rhythm - Possible Left atrial enlargement - Incomplete right bundle branch block - Borderline ECG - No significant change since last tracing  03/30/16 MRI brain [I reviewed images myself and agree with interpretation. -VRP]  1.   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  2.   There are no acute findings.     ASSESSMENT AND PLAN  45 y.o. year old female here with lupus, rheumatoid others, interstitial lung disease, G6PD deficiency, Raynaud's phenomenon, here with 6-7 month history of memory loss, as well as one year history of insomnia, anxiety, racing thoughts. Memory dysfunction is likely related to underlying anxiety, chronic pain, insomnia issues. We'll check MRI brain and labs to rule out other secondary causes.   Ddx: CNS autoimmune/inflamm (neuro-psychiatric lupus) vs anxiety associated cognitive dysfunction; plus incidental cavernous malformation of brain  1. Memory loss   2. Stress reaction   3. Anxiety state   4. Insomnia, unspecified type      PLAN:  - continue autoimmune disease modifying therapy (per Dr. Corliss Skains and Dr. Marchelle Gearing)  - consider psychiatry/psychology evaluation for anxiety and insomnia - gradually increase  physical activity - try mindfulness therapy and visualization techniques  Return in about 6 months (around 10/13/2016).    Suanne Marker, MD 04/14/2016, 2:38 PM Certified in Neurology, Neurophysiology and Neuroimaging  [facility] [address], [address] [place], [place] [address] [phone]

## 2016-04-15 ENCOUNTER — Encounter: Payer: Self-pay | Admitting: Internal Medicine

## 2016-04-15 ENCOUNTER — Other Ambulatory Visit (INDEPENDENT_AMBULATORY_CARE_PROVIDER_SITE_OTHER): Payer: 59

## 2016-04-15 ENCOUNTER — Ambulatory Visit (INDEPENDENT_AMBULATORY_CARE_PROVIDER_SITE_OTHER): Payer: 59 | Admitting: Internal Medicine

## 2016-04-15 VITALS — BP 130/86 | HR 82 | Ht 71.0 in | Wt 234.0 lb

## 2016-04-15 DIAGNOSIS — J849 Interstitial pulmonary disease, unspecified: Secondary | ICD-10-CM

## 2016-04-15 DIAGNOSIS — Z79899 Other long term (current) drug therapy: Secondary | ICD-10-CM

## 2016-04-15 LAB — CBC
HEMATOCRIT: 33.1 % — AB (ref 36.0–46.0)
HEMOGLOBIN: 11 g/dL — AB (ref 12.0–15.0)
MCHC: 33.3 g/dL (ref 30.0–36.0)
MCV: 90 fl (ref 78.0–100.0)
PLATELETS: 224 10*3/uL (ref 150.0–400.0)
RBC: 3.68 Mil/uL — ABNORMAL LOW (ref 3.87–5.11)
RDW: 13.2 % (ref 11.5–15.5)
WBC: 4.8 10*3/uL (ref 4.0–10.5)

## 2016-04-15 LAB — HEPATIC FUNCTION PANEL
ALBUMIN: 3.9 g/dL (ref 3.5–5.2)
ALK PHOS: 66 U/L (ref 39–117)
ALT: 9 U/L (ref 0–35)
AST: 13 U/L (ref 0–37)
Bilirubin, Direct: 0.1 mg/dL (ref 0.0–0.3)
TOTAL PROTEIN: 8 g/dL (ref 6.0–8.3)
Total Bilirubin: 0.3 mg/dL (ref 0.2–1.2)

## 2016-04-15 NOTE — Telephone Encounter (Signed)
Patient had MR Brain Wo Contrast at GI  on 03/30/16

## 2016-04-15 NOTE — Patient Instructions (Addendum)
ICD-9-CM ICD-10-CM   1. ILD (interstitial lung disease) (HCC) 515 J84.9   2. High risk medication use V58.69 Z79.899      - Interstitial lung stable April 2016 through April 2017 but unclear if worse now or not - Glad ENT workup with  Dr. Dallie Piles was unremarkable  Plan - do blood test CBC, chemistry, LFT 04/15/2016 - do HRCT next few weeks  - do Pre-bd spiro and dlco only. No lung volume or bd response. No post-bd spiro - next few weeks  Follow-up Next few weeks after breathing test and CT  - simple office walk test by CMA at followup (not )

## 2016-04-15 NOTE — Progress Notes (Signed)
Subjective:     Patient ID: Shannon [name], female   DOB: 1971/02/12, 45 y.o.   MRN: [id number]  HPI   PCP [name],[name], PA-C Referred by Dr [name]  HPI   IOV 08/23/2014  Chief Complaint  Patient presents with  . Pulmonary Consult    Pt referred by Dr. Corliss Skains for ILD. Pt c/o SOB with activity and rest, dry cough and chest tightness also with and without activity.    44 year old female referred for evaluation of autoimmune interstitial lung disease. She presents with her husband. In 2010 while living in [place] she reports she was diagnosed with lupus associated with rheumatoid arthritis in her joints. In 2012 she moved to live in [facility] and several months after that started noticing insidious onset of shortness of breath. Local rheumatologist diagnosed her with interstitial lung disease. She was referred to Dr. Herma Carson at [facility] in [facility] and was started on CellCept/prednisone for autoimmune interstitial lung disease he and however, sometime around 2 years ago she ran out of medical insurance and stopped taking these medications. During this time her dyspnea has progressed. It is currently rated as moderate to severe. It is present on exertion and relieved by rest. Even minimal amount of exertion makes her very dyspneic. Now she has insurance and she did see Dr. Frederik Pear locally and she has autoimmune panel lab ordered. In addition exam revealed crackles and there for she's been referred here for reevaluation of interstitial lung disease and dyspnea. Dyspnea is associated with some chest tightness but no chest pain. This no associated dizzine   11/24/2014 Follow UP ILD  [name]  She has autoimmune ILD with RA/Lupus  She was seen 6 weeks ago, restarted on Cellcept.  Previously on cellcept but lost her insurance until recently.  On low dose prednisone 5mg  daily .  Last CT chest 4/4 showed ILD changes similar to 2013. Echo  was ok with EFG 55-60%, nml PAP . In March .   Did not take dapsone ,due to  GP6D deficiency.  Labs ok last week with nml LFT , no sign change in hbg. /wbc.  She is feeling better. Does feel her breathing is some better.  No flare of cough or wheezing.     OV 01/16/2015  Chief Complaint  Patient presents with  . Follow-up    Pt c/o DOE, mild dry cough, and chest tightness when SOB. Pt states the chest tightness has improved).    follow-up interstitial lung disease in the setting of rheumatoid arthritis Follow-up high risk medication use - on CellCept and prednisone since mid April 2016  - Presents with her husband. Both give a history. Overall she is doing better in terms of dyspnea after starting CellCept and prednisone. However the improvement this only moderate. She still left with a residual moderate dyspnea on exertion that is also made worse with bending or heart air and improved with rest and cool air. It is associated with some cough and wheezing. She takes albuterol inhaler which she feels helps only somewhat but not fully and not quickly enough. She is frustrated by this. In addition she's complaining of some associated right lower back paraspinal spasm for which massage gives her relief. He has never attended pulmonary rehabilitation.  - Lab review shows she has had problems with potassium and has had potassium supplementation. Last lab check was 12/08/2014. She is due for lab test right now   OV 03/28/2015  Chief Complaint  Patient presents with  . Follow-up    3 month follow up. Pt states that she is still having some problems with her breathing. Pt c/o of feeling chest tightness, chest pain and cough that is dry. Pt denies wheeze. Pt states that she did trial the Advair and does feel that it helped some.   Follow-up interstitial lung disease secondary to autoimmune process and associated dyspnea that seems out of proportion\  She presents with her husband. She continues  on CellCept and prednisone. In the last visit approximately 2-3 months ago she had out of proportion dyspnea. I gave her some Advair to try. She says this only helped a little bit. Overall she says that dyspnea still persists. It is worse than in the spring 2016 when she started CellCept and prednisone. It is stable since July 2016. It is moderate in intensity. Occurs randomly at rest but also with exertion. Occasionally relieved by rest but also happens at rest. Heat and humidity make it worse. Advair helped a little bit only. This no associated chest tightness or wheezing. She did try and enroll  in pulmonary rehabilitation but could not afford the the startup program and is waiting to hear from them for the maintenance program. She is frustrated by all the symptoms.  Pulmonary function test October 15,016 today FVC 2.55 L/64%, total lung capacity 4 L/65% and diffusion 14.5/42%. Overall no change since April 2016   OV 07/04/2015  Chief Complaint  Patient presents with  . Follow-up    pt. states breathing is baseline. SOB. dry cough. wheezing. occ. chest pain/tightness. feels her airway is blocked.   Follow-up interstitial lung disease admitted autoimmune processes and associated out of proportion dyspnea  She presents again with her husband. She continues on CellCept 2 g twice daily associated with prednisone. She cannot take Bactrim or dapsone due to G6PD deficiency. Last seen in the fall of 2016. She was having out of proportion dyspnea. Rated cardia pulmonary stress test and she could not tolerate this test. She then underwent right heart catheterization mid-November 2016 with Dr. Marca Ancona. Review the tests this is normal. Overall stable but she and husband still continued to complain about this resting dyspnea associated with wheezing. They hear noises in the chest. Sometime she gasps for air even at rest. She feels it comes from the chest but the husband points to the throat. I offered  second opinion at [facility] because of this unusual symptoms but they declined citing distance. She was supposed to attend pulmonary rehabilitation but they cannot afford a $60 co-pay twice a week 8 weeks. Offered ENT evaluation that agreeable but they wanted done in [place] which is closer logistically. In addition patient is contemplating now applying for disability. 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  Walking desaturation test 185 feet 3 laps and rheumatic: No desaturation    OV 10/12/2015  Chief Complaint  Patient presents with  . Follow-up    PFT today.  breathing is better.  Pt is currently on STD, [employer] approved STD until today, wants to know today if pt is able to return to work, any restrictions.  Pt states that she has been more stable while out of work.      Follow-up interstitial lung disease due to autoimmune processes not otherwise specified  Last seen January 2017. Since then she has been on short-term disability and out of work. She does heavy manual work. The lack of work estimated  dyspnea better. She did pulmonary function test today that I personally visualized and overall it is same. There are no new issues. She is on CellCept and she is tolerating this fine. Last visit she had some anemia we follow this up and the anemia was better. Also hypokalemia resolved by February 2017. She is due for blood work today. 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 This showed up at last visit. She saw an ENT in [place] within referred her to [facility]. She does not want to go to [facility]. She ended up seeing Dr. Ezzard Standing locally. But now she is going to see Dr.  Dina Rich [facility]   Pulmonary function test today 10/12/2015 shows postbronchodilator FVC 2.65 L/67%. FEV1 2.34 L/72% which is up 17% positive bronchodilator response. Ratio of 80/106%. Total  lung capacity of 3.8/62%. DLCO 18.36/52%. Overall consistent with restriction and lung parenchymal disease. Overall PFTs are stable compared to April 2016 but perhaps in DLCO slightly better   OV 04/15/2016  Chief Complaint  Patient presents with  . Interstitial Lung Disease    Breathing is unchanged since last OV. Reports SOB, coughing. Cough is non productive. Denies chest tightness or wheezing.   Follow-up interstitial lung disease due to autoimmune processes not otherwise specified. On CellCept and prednisone. Not on Bactrim prophylaxis due to G6PD deficiency  Last visit April 2017. At that time based on pulmonary function tests showed stability and ILD for a year. 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 She did see local ENT doctor [place] and apparently has been reassured. At this point in time she is applying for long-term disability at work but the work discharged from services and she is now applying for Social Security disability. Her dyspnea stable since the interim. She's also had hysterectomy but for the last few weeks her cough is worse and it is dry. This no fever. Is no weight loss or chills. She says she's compliant with her CellCept and prednisone.   has a past medical history of Anemia; G6PD deficiency (HCC); ILD (interstitial lung disease) (HCC); Lupus; and Rheumatoid arthritis (HCC).   reports that she quit smoking about 18 months ago. Her smoking use included Cigarettes. She has a 7.00 pack-year smoking history. She has never used smokeless tobacco.  Past Surgical History:  Procedure Laterality Date  . APPENDECTOMY  1980  . CARDIAC CATHETERIZATION N/A 05/10/2015   Procedure: Right Heart Cath;  Surgeon: Laurey Morale, MD;  Location: [facility] INVASIVE CV LAB;  Service: Cardiovascular;  Laterality: N/A;  . CESAREAN SECTION  '95, '02, '07   X 3  . CHOLECYSTECTOMY  2010  . LAPAROSCOPIC HYSTERECTOMY  10/2015   have ovaries  . TUBAL LIGATION       Allergies  Allergen Reactions  . Asa [Aspirin]     HIves  . Penicillins Hives  . Sulfa Antibiotics     G6PD deficiency     Immunization History  Administered Date(s) Administered  . Influenza Split 03/23/2014  . Influenza,inj,Quad PF,36+ Mos 03/15/2016  . Pneumococcal-Unspecified 08/23/2014    Family History  Problem Relation Age of Onset  . Sarcoidosis Mother   . Lupus Sister      Current Outpatient Prescriptions:  .  albuterol (PROVENTIL HFA;VENTOLIN HFA) 108 (90 Base) MCG/ACT inhaler, Inhale 2 puffs into the lungs every 6 (six) hours as needed for wheezing  or shortness of breath., Disp: 1 Inhaler, Rfl: 5 .  diclofenac sodium (VOLTAREN) 1 % GEL, Apply 2 g topically 4 (four) times daily., Disp: , Rfl:  .  ferrous sulfate 325 (65 FE) MG tablet, Take by mouth., Disp: , Rfl:  .  ibuprofen (ADVIL,MOTRIN) 200 MG tablet, Take 200 mg by mouth every 6 (six) hours as needed for moderate pain. , Disp: , Rfl:  .  mycophenolate (CELLCEPT) 500 MG tablet, Take 2 tablets (1,000 mg total) by mouth 2 (two) times daily., Disp: 120 tablet, Rfl: 5 .  predniSONE (DELTASONE) 1 MG tablet, Take 4 mg by mouth daily with breakfast., Disp: , Rfl:  .  Vitamin D, Ergocalciferol, (DRISDOL) 50000 units CAPS capsule, Take 50,000 Units by mouth every 7 (seven) days., Disp: , Rfl:     Review of Systems     Objective:   Physical Exam  Constitutional: She is oriented to person, place, and time. She appears well-developed and well-nourished. No distress.  HENT:  Head: Normocephalic and atraumatic.  Right Ear: External ear normal.  Left Ear: External ear normal.  Mouth/Throat: Oropharynx is clear and moist. No oropharyngeal exudate.  Eyes: Conjunctivae and EOM are normal. Pupils are equal, round, and reactive to light. Right eye exhibits no discharge. Left eye exhibits no discharge. No scleral icterus.  Neck: Normal range of motion. Neck supple. No JVD present. No tracheal deviation present. No  thyromegaly present.  Cardiovascular: Normal rate, regular rhythm, normal heart sounds and intact distal pulses.  Exam reveals no gallop and no friction rub.   No murmur heard. Pulmonary/Chest: Effort normal. No respiratory distress. She has no wheezes. She has rales. She exhibits no tenderness.  Abdominal: Soft. Bowel sounds are normal. She exhibits no distension and no mass. There is no tenderness. There is no rebound and no guarding.  Musculoskeletal: Normal range of motion. She exhibits no edema or tenderness.  Lymphadenopathy:    She has no cervical adenopathy.  Neurological: She is alert and oriented to person, place, and time. She has normal reflexes. No cranial nerve deficit. She exhibits normal muscle tone. Coordination normal.  Skin: Skin is warm and dry. No rash noted. She is not diaphoretic. No erythema. No pallor.  Psychiatric: She has a normal mood and affect. Her behavior is normal. Judgment and thought content normal.  Vitals reviewed.   Vitals:   04/15/16 1003 04/15/16 1004  BP:  130/86  Pulse:  82  SpO2:  98%  Weight: 234 lb (106.1 kg)   Height: 5\' 11"  (1.803 m)         Assessment:       ICD-9-CM ICD-10-CM   1. ILD (interstitial lung disease) (HCC) 515 J84.9   2. High risk medication use V58.69 Z79.899        Plan:      - Interstitial lung stable April 2016 through April 2017 but unclear if worse now or not - Glad ENT workup with  Dr. Dallie Piles was unremarkable  Plan - do blood test CBC, chemistry, LFT 04/15/2016 - do HRCT next few weeks  - do Pre-bd spiro and dlco only. No lung volume or bd response. No post-bd spiro - next few weeks  Follow-up Next few weeks after breathing test and CT  - simple office walk test by CMA at followup (not )      Dr. Kalman Shan, M.D., [facility].C.P Pulmonary and Critical Care Medicine Staff Physician [facility] Pager: [phone], If  no answer or between  15:00h -  7:00h: call [phone]  04/15/2016 10:26 AM

## 2016-04-24 ENCOUNTER — Ambulatory Visit (INDEPENDENT_AMBULATORY_CARE_PROVIDER_SITE_OTHER)
Admission: RE | Admit: 2016-04-24 | Discharge: 2016-04-24 | Disposition: A | Discharge status: Home / Self Care | Payer: 59 | Source: Ambulatory Visit | Attending: Internal Medicine | Admitting: Internal Medicine

## 2016-04-24 ENCOUNTER — Encounter: Payer: Self-pay | Admitting: Radiology

## 2016-04-24 DIAGNOSIS — J849 Interstitial pulmonary disease, unspecified: Secondary | ICD-10-CM | POA: Diagnosis not present

## 2016-04-25 NOTE — Progress Notes (Signed)
Called and spoke to pt. Informed pt of the results and recs per MR. Pt verbalized understanding and denied any further questions or concerns at this time.  

## 2016-05-06 ENCOUNTER — Ambulatory Visit (INDEPENDENT_AMBULATORY_CARE_PROVIDER_SITE_OTHER): Payer: 59 | Admitting: Internal Medicine

## 2016-05-06 DIAGNOSIS — J849 Interstitial pulmonary disease, unspecified: Secondary | ICD-10-CM

## 2016-05-06 LAB — PULMONARY FUNCTION TEST
DL/VA % PRED: 94 %
DL/VA: 5.23 ml/min/mmHg/L
DLCO COR % PRED: 56 %
DLCO UNC: 18.31 ml/min/mmHg
DLCO cor: 19.41 ml/min/mmHg
DLCO unc % pred: 53 %
FEF 25-75 Pre: 2.06 L/sec
FEF2575-%Pred-Pre: 63 %
FEV1-%PRED-PRE: 65 %
FEV1-PRE: 2.1 L
FEV1FVC-%PRED-PRE: 95 %
FEV6-%Pred-Pre: 68 %
FEV6-Pre: 2.62 L
FEV6FVC-%PRED-PRE: 101 %
FVC-%Pred-Pre: 67 %
FVC-PRE: 2.63 L
PRE FEV1/FVC RATIO: 80 %
Pre FEV6/FVC Ratio: 99 %

## 2016-05-07 DIAGNOSIS — Z0271 Encounter for disability determination: Secondary | ICD-10-CM

## 2016-05-26 NOTE — Progress Notes (Signed)
Office Visit Note  Patient: Shannon [name]             Date of Birth: December 19, 1970           MRN: [id number]             PCP: Otila Back Referring: Eunice Blase, PA-C Visit Date: 05/27/2016 Occupation: @GUAROCC @    Subjective:  Pain of the Left Hand; Pain of the Right Hand; Other and Pain of the Right Knee; and Other and Pain of the Left Knee Follow-up on lupus and high risk prescription.  History of Present Illness: Shannon [name] is a 45 y.o. female  Last seen 02/26/2016. Patient's lupus is about the same as last visit. She's had about 3 or 4 oral ulcer since the last visit. No nasal ulcers. Her fingerstick cold. She wears gloves around the house. No new rashes.  She is on CellCept: 500 mg: 2 pills twice a day. She has a history of G6PD deficiency and therefore cannot take dapsone. Patient gets CellCept from Dr. Marchelle Gearing who last did labs on 04/15/2016 for the patient including liver function test and CBC with differential. Those labs are close to normal limits and can be found in Epic.  We also did labs for the patient back in 02/26/2016. It showed CMP with GFR normal CBC with differential is close to normal limits sedimentation rate normal C3-C4 normal Double-stranded DNA negative Urinalysis negative Vitamin D low at 18 and we treated it with vitamin D prescription strength weekly for 3 months. Her ANA is positive with a titer of 1:1280  She also takes prednisone daily and she is now on 3 mg since October.  She states that her hands have some numbness and tingling at times. Her Raynaud's, she feels, is the usual cause of her numbness and tingling. She does not describe symptoms coming from carpal tunnel. They do not occur at night, she does not have any shooting pain from the wrist to the proximal forearm  Patient states that she was walking down her stairs at home, she heard a pop from her right knee and then the right knee gave away and she fell down the  stairs. She did not hit her head. She did not pass out. She's having some pain to the right knee joint still.    Activities of Daily Living:  Patient reports morning stiffness for 30 minutes.   Patient Reports nocturnal pain.  Difficulty dressing/grooming: Reports Difficulty climbing stairs: Reports Difficulty getting out of chair: Reports Difficulty using hands for taps, buttons, cutlery, and/or writing: Reports   Review of Systems  Constitutional: Negative for fatigue.  HENT: Negative for mouth sores and mouth dryness.   Eyes: Negative for dryness.  Respiratory: Negative for shortness of breath.   Gastrointestinal: Negative for constipation and diarrhea.  Musculoskeletal: Negative for myalgias and myalgias.  Skin: Negative for sensitivity to sunlight.  Psychiatric/Behavioral: Negative for decreased concentration and sleep disturbance.    PMFS History:  Patient Active Problem List   Diagnosis Date Noted  . High risk medication use 01/16/2015  . G6PD deficiency (HCC) 10/31/2014  . Dyspnea 08/23/2014  . ILD (interstitial lung disease) (HCC) 08/23/2014  . Lupus 08/23/2014  . Other organ or system involvement in systemic lupus erythematosus (HCC) 08/23/2014    Past Medical History:  Diagnosis Date  . Anemia   . G6PD deficiency (HCC)   . ILD (interstitial lung disease) (HCC)   . Lupus   . Rheumatoid arthritis (HCC)  Family History  Problem Relation Age of Onset  . Sarcoidosis Mother   . Lupus Sister    Past Surgical History:  Procedure Laterality Date  . APPENDECTOMY  1980  . CARDIAC CATHETERIZATION N/A 05/10/2015   Procedure: Right Heart Cath;  Surgeon: Laurey Morale, MD;  Location: [facility] INVASIVE CV LAB;  Service: Cardiovascular;  Laterality: N/A;  . CESAREAN SECTION  '95, '02, '07   X 3  . CHOLECYSTECTOMY  2010  . LAPAROSCOPIC HYSTERECTOMY  10/2015   have ovaries  . TUBAL LIGATION     Social History   Social History Narrative   Married, lives with  husband, children   Caffeine- coffee  1 daily     Objective: Vital Signs: BP 138/90   Pulse 76   Resp 16   Ht 5\' 11"  (1.803 m)   Wt 236 lb (107 kg)   LMP 04/28/2015   BMI 32.92 kg/m    Physical Exam  Constitutional: She is oriented to person, place, and time. She appears well-developed and well-nourished.  HENT:  Head: Normocephalic and atraumatic.  Eyes: EOM are normal. Pupils are equal, round, and reactive to light.  Cardiovascular: Normal rate, regular rhythm and normal heart sounds.  Exam reveals no gallop and no friction rub.   No murmur heard. Pulmonary/Chest: Effort normal and breath sounds normal. She has no wheezes. She has no rales.  Abdominal: Soft. Bowel sounds are normal. She exhibits no distension. There is no tenderness. There is no guarding. No hernia.  Musculoskeletal: Normal range of motion. She exhibits no edema, tenderness or deformity.  Lymphadenopathy:    She has no cervical adenopathy.  Neurological: She is alert and oriented to person, place, and time. Coordination normal.  Skin: Skin is warm and dry. Capillary refill takes less than 2 seconds. No rash noted.  Psychiatric: She has a normal mood and affect. Her behavior is normal.     Musculoskeletal Exam:  Full range of motion of all joints Grip strength is equal and strong bilaterally Fiber myalgia tender points are absent  CDAI Exam: CDAI Homunculus Exam:   Joint Counts:  CDAI Tender Joint count: 0 CDAI Swollen Joint count: 0     Investigation: Findings:  08/21/2014 x-ray of bilateral hands, 2 views , which showed bilateral PIP narrowing.  No MCP joint narrowing was noted.  No erosive changes were noted.  Bilateral elbow joint x-rays showed some joint space narrowing without any erosive changes.  Bilateral knee joint x-rays, 2 views, showed moderate medial compartment narrowing, bilateral moderate patellofemoral narrowing without any chondrocalcinosis, consistent with moderate  osteoarthritis.  Systemic lupus erythematosus.  She has a history of arthritis, Raynaud phenomenon, oral ulcers, hair loss,   Patient has G6PD deficiency  High risk medication use, on Cellcept from pulmonology currently is tapering prednisone     Imaging: Xr Knee 3 View Right  Result Date: 05/27/2016 Right knee x-ray, 3 views No bony abnormalities noted on the x-ray Compared to September 2017 x-ray of the right knee no changes are there. Ongoing moderate medial compartment narrowing Mild patellofemoral joint space narrowing Patient fell this past Thursday and fell down about 4 flights of steps when her right knee had severe pain and gave out after she heard a pop. We will refer her to orthopedist if today's cortisone injection does not relieve her current inflammation/pain. She may need MRI if no improvement in the next few weeks.   Speciality Comments: No specialty comments available.    Procedures:  Large  Joint Inj Date/Time: 05/27/2016 10:50 AM Performed by: Tawni Pummel Authorized by: Tawni Pummel   Consent Given by:  Patient Site marked: the procedure site was marked   Timeout: prior to procedure the correct patient, procedure, and site was verified   Indications:  Pain and joint swelling Location:  Knee Site:  R knee Prep: patient was prepped and draped in usual sterile fashion   Needle Size:  27 G Needle Length:  1.5 inches Approach:  Medial Ultrasound Guidance: No   Fluoroscopic Guidance: No   Arthrogram: No   Medications:  1.5 mL lidocaine 1 %; 40 mg triamcinolone acetonide 40 MG/ML Aspiration Attempted: Yes   Patient tolerance:  Patient tolerated the procedure well with no immediate complications   Allergies: Asa [aspirin]; Penicillins; and Sulfa antibiotics   Assessment / Plan:     Visit Diagnoses: Other organ or system involvement in systemic lupus erythematosus (HCC) - Positive ANA with a titer of 1:1280.  Positive RNP and positive Smith.   - Plan: CBC  with Differential/Platelet, COMPLETE METABOLIC PANEL WITH GFR, VITAMIN D 25 Hydroxy (Vit-D Deficiency, Fractures)  High risk medication use - Plan: CBC with Differential/Platelet, COMPLETE METABOLIC PANEL WITH GFR, VITAMIN D 25 Hydroxy (Vit-D Deficiency, Fractures)  G6PD deficiency (HCC)  ILD (interstitial lung disease) (HCC)  Acute pain of right knee - Plan: XR KNEE 3 VIEW RIGHT  Primary osteoarthritis of both knees  Vitamin D deficiency - Plan: VITAMIN D 25 Hydroxy (Vit-D Deficiency, Fractures)   Since patient gets her CellCept filled by Dr. Marchelle Gearing, we will let his office continue doing that for her.  Continue prednisone currently at 3 mg  Currently having right knee joint pain after fall last Thursday down the stairs when her knee gave out after she heard something pop. After informed consent was obtained the site was prepped in usual fashion injected with 40 mg of Kenalog mixed with 1 mL of 1% lidocaine. Patient tolerated procedure well. There is no complications  History of vitamin D deficiency. We treated her recently and we need to recheck her vitamin D today. I'll also do CBC with differential CMP with GFR  Right knee x-ray, 3 views No bony abnormalities noted on the x-ray Compared to September 2017 x-ray of the right knee no changes are there. Ongoing moderate medial compartment narrowing Mild patellofemoral joint space narrowing  Patient fell this past Thursday and fell down about 4 flights of steps when her right knee had severe pain and gave out after she heard a pop. We will refer her to orthopedist if today's cortisone injection does not relieve her current inflammation/pain. She may need MRI if no improvement in the next few weeks.    Prescription: Duke's Magic mouthwash 1:1:1; Maalox: Benadryl: 2% viscous lidocaine Apply sparingly to mouth sores up to 4 times a day Dispense 2 ounces with 4 refills  Note: Patient has trouble with her hands. She states that  she has numbness and tingling at times. It could be from carpal tunnel. We will evaluate this on the next visit. I'll bring her back in couple of months. Urgently we have to address her current lupus as well as her right knee injury  Orders: Orders Placed This Encounter  Procedures  . Large Joint Injection/Arthrocentesis  . XR KNEE 3 VIEW RIGHT  . CBC with Differential/Platelet  . COMPLETE METABOLIC PANEL WITH GFR  . VITAMIN D 25 Hydroxy (Vit-D Deficiency, Fractures)   No orders of the defined types were placed in this encounter.  Face-to-face time spent with patient was 40 minutes. 50% of time was spent in counseling and coordination of care.  Follow-Up Instructions: Return in about 4 months (around 09/25/2016) for sle,raynauds,cellcept 1000 bid,ild, bilateral oa knee.   Tawni Pummel, PA-C   I examined and evaluated the patient with Tawni Pummel PA. The plan of care was discussed as noted above.  [name], MD

## 2016-05-27 ENCOUNTER — Encounter: Payer: Self-pay | Admitting: Rheumatology

## 2016-05-27 ENCOUNTER — Ambulatory Visit (INDEPENDENT_AMBULATORY_CARE_PROVIDER_SITE_OTHER): Payer: 59

## 2016-05-27 ENCOUNTER — Ambulatory Visit (INDEPENDENT_AMBULATORY_CARE_PROVIDER_SITE_OTHER): Payer: 59 | Admitting: Rheumatology

## 2016-05-27 VITALS — BP 138/90 | HR 76 | Resp 16 | Ht 71.0 in | Wt 236.0 lb

## 2016-05-27 DIAGNOSIS — E559 Vitamin D deficiency, unspecified: Secondary | ICD-10-CM | POA: Diagnosis not present

## 2016-05-27 DIAGNOSIS — J849 Interstitial pulmonary disease, unspecified: Secondary | ICD-10-CM

## 2016-05-27 DIAGNOSIS — M25561 Pain in right knee: Secondary | ICD-10-CM | POA: Diagnosis not present

## 2016-05-27 DIAGNOSIS — M3219 Other organ or system involvement in systemic lupus erythematosus: Secondary | ICD-10-CM

## 2016-05-27 DIAGNOSIS — D75A Glucose-6-phosphate dehydrogenase (G6PD) deficiency without anemia: Secondary | ICD-10-CM

## 2016-05-27 DIAGNOSIS — D550 Anemia due to glucose-6-phosphate dehydrogenase [G6PD] deficiency: Secondary | ICD-10-CM | POA: Diagnosis not present

## 2016-05-27 DIAGNOSIS — Z79899 Other long term (current) drug therapy: Secondary | ICD-10-CM | POA: Diagnosis not present

## 2016-05-27 DIAGNOSIS — M170 Bilateral primary osteoarthritis of knee: Secondary | ICD-10-CM

## 2016-05-27 LAB — COMPLETE METABOLIC PANEL WITH GFR
ALT: 13 U/L (ref 6–29)
AST: 17 U/L (ref 10–30)
Albumin: 4.1 g/dL (ref 3.6–5.1)
Alkaline Phosphatase: 64 U/L (ref 33–115)
BILIRUBIN TOTAL: 0.3 mg/dL (ref 0.2–1.2)
BUN: 11 mg/dL (ref 7–25)
CO2: 25 mmol/L (ref 20–31)
Calcium: 9 mg/dL (ref 8.6–10.2)
Chloride: 104 mmol/L (ref 98–110)
Creat: 0.71 mg/dL (ref 0.50–1.10)
GFR, Est African American: >89 mL/min (ref >=60)
GLUCOSE: 78 mg/dL (ref 65–99)
Potassium: 3.8 mmol/L (ref 3.5–5.3)
SODIUM: 138 mmol/L (ref 135–146)
TOTAL PROTEIN: 7.8 g/dL (ref 6.1–8.1)

## 2016-05-27 LAB — CBC WITH DIFFERENTIAL/PLATELET
Basophils Absolute: 0 cells/uL (ref 0–200)
Basophils Relative: 0 %
EOS PCT: 2 %
Eosinophils Absolute: 62 cells/uL (ref 15–500)
HCT: 36 % (ref 35.0–45.0)
HEMOGLOBIN: 11.4 g/dL — AB (ref 11.7–15.5)
LYMPHS ABS: 1054 {cells}/uL (ref 850–3900)
Lymphocytes Relative: 34 %
MCH: 29.2 pg (ref 27.0–33.0)
MCHC: 31.7 g/dL — AB (ref 32.0–36.0)
MCV: 92.3 fL (ref 80.0–100.0)
MPV: 11.2 fL (ref 7.5–12.5)
Monocytes Absolute: 186 cells/uL — ABNORMAL LOW (ref 200–950)
Monocytes Relative: 6 %
NEUTROS PCT: 58 %
Neutro Abs: 1798 cells/uL (ref 1500–7800)
Platelets: 239 10*3/uL (ref 140–400)
RBC: 3.9 MIL/uL (ref 3.80–5.10)
RDW: 13.7 % (ref 11.0–15.0)
WBC: 3.1 10*3/uL — AB (ref 3.8–10.8)

## 2016-05-27 MED ORDER — TRIAMCINOLONE ACETONIDE 40 MG/ML IJ SUSP
40.0000 mg | INTRAMUSCULAR | Status: AC | PRN
  Administered 2016-05-27: 40 mg via INTRA_ARTICULAR

## 2016-05-27 MED ORDER — LIDOCAINE HCL 1 % IJ SOLN
1.5000 mL | INTRAMUSCULAR | Status: AC | PRN
  Administered 2016-05-27: 1.5 mL

## 2016-05-28 LAB — VITAMIN D 25 HYDROXY (VIT D DEFICIENCY, FRACTURES): Vit D, 25-Hydroxy: 48 ng/mL (ref 30–100)

## 2016-05-28 NOTE — Progress Notes (Signed)
Tell patient: Note#1: Vitamin D is normal at 48.#2: CMP with GFR is normal#3: CBC with differential is normal except slight decrease in WBCs. (Consistent with patient's previous labs).Mild anemia

## 2016-05-29 ENCOUNTER — Ambulatory Visit (INDEPENDENT_AMBULATORY_CARE_PROVIDER_SITE_OTHER): Payer: 59 | Admitting: Internal Medicine

## 2016-05-29 ENCOUNTER — Encounter: Payer: Self-pay | Admitting: Internal Medicine

## 2016-05-29 VITALS — BP 144/98 | HR 77 | Ht 71.0 in | Wt 236.4 lb

## 2016-05-29 DIAGNOSIS — J849 Interstitial pulmonary disease, unspecified: Secondary | ICD-10-CM | POA: Diagnosis not present

## 2016-05-29 DIAGNOSIS — R053 Chronic cough: Secondary | ICD-10-CM

## 2016-05-29 DIAGNOSIS — Z79899 Other long term (current) drug therapy: Secondary | ICD-10-CM

## 2016-05-29 DIAGNOSIS — R05 Cough: Secondary | ICD-10-CM | POA: Diagnosis not present

## 2016-05-29 MED ORDER — FLUTICASONE FUROATE 200 MCG/ACT IN AEPB: 1.0000 | INHALATION_SPRAY | Freq: Every day | RESPIRATORY_TRACT | 0 refills | Status: DC

## 2016-05-29 NOTE — Progress Notes (Signed)
Subjective:     Patient ID: Shannon [name], female   DOB: 1970-09-18, 45 y.o.   MRN: [id number]  HPI    PCP [name],[name], PA-C Referred by Dr Shannon [name]  HPI   IOV 08/23/2014  Chief Complaint  Patient presents with  . Pulmonary Consult    Pt referred by Dr. Corliss [name] for ILD. Pt c/o SOB with activity and rest, dry cough and chest tightness also with and without activity.    45 year old female referred for evaluation of autoimmune interstitial lung disease. She presents with her husband. In 2010 while living in [place] she reports she was diagnosed with lupus associated with rheumatoid arthritis in her joints. [place] and several months after that started noticing insidious onset of shortness of breath. Local rheumatologist diagnosed her with interstitial lung disease. She was referred to Dr. Herma [name] at [name] [name] and was started on CellCept/prednisone for autoimmune interstitial lung disease he and however, sometime around 2 years ago she ran out of medical insurance and stopped taking these medications. During this time her dyspnea has progressed. It is currently rated as moderate to severe. It is present on exertion and relieved by rest. Even minimal amount of exertion makes her very dyspneic. Now she has insurance and she did see Dr. Frederik [name] locally and she has autoimmune panel lab ordered. In addition exam revealed crackles and there for she's been referred here for reevaluation of interstitial lung disease and dyspnea. Dyspnea is associated with some chest tightness but no chest pain. This no associated dizzine   11/24/2014 Follow UP ILD  [name]  She has autoimmune ILD with RA/Lupus  She was seen 6 weeks ago, restarted on Cellcept.  Previously on cellcept but lost her insurance until recently.  On low dose prednisone 5mg  daily .  Last CT chest 4/4 showed ILD changes similar to  2013. Echo was ok with EFG 55-60%, nml PAP . In March .   Did not take dapsone ,due to  GP6D deficiency.  Labs ok last week with nml LFT , no sign change in hbg. /wbc.  She is feeling better. Does feel her breathing is some better.  No flare of cough or wheezing.     OV 01/16/2015  Chief Complaint  Patient presents with  . Follow-up    Pt c/o DOE, mild dry cough, and chest tightness when SOB. Pt states the chest tightness has improved).    follow-up interstitial lung disease in the setting of rheumatoid arthritis Follow-up high risk medication use - on CellCept and prednisone since mid April 2016  - Presents with her husband. Both give a history. Overall she is doing better in terms of dyspnea after starting CellCept and prednisone. However the improvement this only moderate. She still left with a residual moderate dyspnea on exertion that is also made worse with bending or heart air and improved with rest and cool air. It is associated with some cough and wheezing. She takes albuterol inhaler which she feels helps only somewhat but not fully and not quickly enough. She is frustrated by this. In addition she's complaining of some associated right lower back paraspinal spasm for which massage gives her relief. He has never attended pulmonary rehabilitation.  - Lab review shows she has had problems with potassium and has had potassium supplementation. Last lab check was 12/08/2014. She is due for lab test right now   OV  03/28/2015   Chief Complaint  Patient presents with  . Follow-up    3 month follow up. Pt states that she is still having some problems with her breathing. Pt c/o of feeling chest tightness, chest pain and cough that is dry. Pt denies wheeze. Pt states that she did trial the Advair and does feel that it helped some.   Follow-up interstitial lung disease secondary to autoimmune process and associated dyspnea that seems out of proportion\  She presents with her husband.  She continues on CellCept and prednisone. In the last visit approximately 2-3 months ago she had out of proportion dyspnea. I gave her some Advair to try. She says this only helped a little bit. Overall she says that dyspnea still persists. It is worse than in the spring 2016 when she started CellCept and prednisone. It is stable since July 2016. It is moderate in intensity. Occurs randomly at rest but also with exertion. Occasionally relieved by rest but also happens at rest. Heat and humidity make it worse. Advair helped a little bit only. This no associated chest tightness or wheezing. She did try and enroll  in pulmonary rehabilitation but could not afford the the startup program and is waiting to hear from them for the maintenance program. She is frustrated by all the symptoms.  Pulmonary function test October 15,016 today FVC 2.55 L/64%, total lung capacity 4 L/65% and diffusion 14.5/42%. Overall no change since April 2016   OV 07/04/2015  Chief Complaint  Patient presents with  . Follow-up    pt. states breathing is baseline. SOB. dry cough. wheezing. occ. chest pain/tightness. feels her airway is blocked.   Follow-up interstitial lung disease admitted autoimmune processes and associated out of proportion dyspnea  She presents again with her husband. She continues on CellCept 2 g twice daily associated with prednisone. She cannot take Bactrim or dapsone due to G6PD deficiency. Last seen in the fall of 2016. She was having out of proportion dyspnea. Rated cardia pulmonary stress test and she could not tolerate this test. She then underwent right heart catheterization mid-November 2016 with [name]. Review the tests this is normal. Overall stable but she and husband still continued to complain about this resting dyspnea associated with wheezing. They hear noises in the chest. Sometime she gasps for air even at rest. She feels it comes from the chest but the husband points to the throat. I  offered second opinion at [facility] because of this unusual symptoms but they declined citing distance. She was supposed to attend pulmonary rehabilitation but they cannot afford a $60 co-pay twice a week 8 weeks. Offered ENT evaluation that agreeable but they wanted done in [place] which is closer logistically. In addition patient is contemplating now applying for disability. 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  Walking desaturation test 185 feet 3 laps and rheumatic: No desaturation    OV 10/12/2015  Chief Complaint  Patient presents with  . Follow-up    PFT today.  breathing is better.  Pt is currently on STD, [employer] approved STD until today, wants to know today if pt is able to return to work, any restrictions.  Pt states that she has been more stable while out of work.      Follow-up interstitial lung disease due to autoimmune processes not otherwise specified  Last seen January 2017. Since then she has been on short-term disability and out of work. She does heavy manual work. The lack  of work estimated dyspnea better. She did pulmonary function test today that I personally visualized and overall it is same. There are no new issues. She is on CellCept and she is tolerating this fine. Last visit she had some anemia we follow this up and the anemia was better. Also hypokalemia resolved by February 2017. She is due for blood work today. 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 This showed up at last visit. She saw an ENT in [place] within referred her to [facility]. She does not want to go to [name]. She ended up seeing Dr. Ezzard Standing locally. But now she is going to see Dr.  Dina Rich [facility]   Pulmonary function test today 10/12/2015 shows postbronchodilator FVC 2.65 L/67%. FEV1 2.34 L/72% which is up 17% positive bronchodilator response. Ratio of 80/106%.  Total lung capacity of 3.8/62%. DLCO 18.36/52%. Overall consistent with restriction and lung parenchymal disease. Overall PFTs are stable compared to April 2016 but perhaps in DLCO slightly better   OV 04/15/2016  Chief Complaint  Patient presents with  . Interstitial Lung Disease    Breathing is unchanged since last OV. Reports SOB, coughing. Cough is non productive. Denies chest tightness or wheezing.   Follow-up interstitial lung disease due to autoimmune processes not otherwise specified. On CellCept and prednisone. Not on Bactrim prophylaxis due to G6PD deficiency  Last visit April 2017. At that time based on pulmonary function tests showed stability and ILD for a year. 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 She did see local ENT doctor [place] and apparently has been reassured. At this point in time she is applying for long-term disability at work but the work discharged from services and she is now applying for Social Security disability. Her dyspnea stable since the interim. She's also had hysterectomy but for the last few weeks her cough is worse and it is dry. This no fever. Is no weight loss or chills. She says she's compliant with her CellCept and prednisone.  OV 05/29/2016  Chief Complaint  Patient presents with  . Follow-up    Pt states she still has harsh dry cough, pt states her SOB is unchanged and chest tightness when SOB or when coughing a lot. Pt deneis f/c/s.     Follow-up interstitial lung disease due to autoimmune processes not otherwise specified. On CellCept and prednisone since April 2016. Not on Bactrim prophylaxis due to G6PD deficiency  Keyasha returns for follow-up. She had high resolution CT chest that shows stability and interstitial lung disease since April 2016. She Pulmicort function test that shows mild improvement since April 2016. Therefore it appears that her CellCept and prednisone is helping her she'll lung disease  related to collagen-vascular disease. However she tells me that she continues to have chronic cough for the last few to several months. It is progressive. It is dry. It is worse than her baseline. The dyspnea is unchanged. This no fever or weight loss or chills   IMPRESSION: 1. No appreciable interval change in basilar predominant fibrotic interstitial lung disease characterized by patchy subpleural reticulation and ground-glass attenuation, mild traction bronchiolectasis and mild honeycombing. Findings are consistent with collagen vascular disease-related interstitial lung disease, favor fibrotic nonspecific interstitial pneumonia (NSIP) pattern given the stability. 2. Stable symmetric mild axillary lymphadenopathy, consistent with benign reactive adenopathy probably due to collagen vascular disease.  Electronically Signed   By: Delbert Phenix M.D.   On: 04/24/2016 11:58  Results for HAIDEN, CLUCAS (MRN [id number]) as of 05/29/2016 10:13  Ref. Range 10/09/2014 08:52 03/28/2015 10:46 10/12/2015 13:42 05/06/2016 10:15  FVC-Pre Latest Units: L 2.54 2.55 2.53 2.63  FVC-%Pred-Pre Latest Units: % 64 64 64 67   Results for MICHAELLA, IMAI (MRN [id number]) as of 05/29/2016 10:13  Ref. Range 10/09/2014 08:52 03/28/2015 10:46 10/12/2015 13:42 05/06/2016 10:15  DLCO unc Latest Units: ml/min/mmHg 15.03 14.50 18.36 18.31  DLCO unc % pred Latest Units: % 43 42 53 53    has a past medical history of Anemia; G6PD deficiency (HCC); ILD (interstitial lung disease) (HCC); Lupus; and Rheumatoid arthritis (HCC).   reports that she quit smoking about 20 months ago. Her smoking use included Cigarettes. She has a 7.00 pack-year smoking history. She has never used smokeless tobacco.  Past Surgical History:  Procedure Laterality Date  . APPENDECTOMY  1980  . CARDIAC CATHETERIZATION N/A 05/10/2015   Procedure: Right Heart Cath;  Surgeon: Laurey Morale, MD;  Location: [facility] INVASIVE CV LAB;  Service:  Cardiovascular;  Laterality: N/A;  . CESAREAN SECTION  '95, '02, '07   X 3  . CHOLECYSTECTOMY  2010  . LAPAROSCOPIC HYSTERECTOMY  10/2015   have ovaries  . TUBAL LIGATION      Allergies  Allergen Reactions  . Asa [Aspirin]     HIves  . Penicillins Hives  . Sulfa Antibiotics     G6PD deficiency     Immunization History  Administered Date(s) Administered  . Influenza Split 03/23/2014  . Influenza,inj,Quad PF,36+ Mos 03/15/2016  . Pneumococcal-Unspecified 08/23/2014    Family History  Problem Relation Age of Onset  . Sarcoidosis Mother   . Lupus Sister      Current Outpatient Prescriptions:  .  albuterol (PROVENTIL HFA;VENTOLIN HFA) 108 (90 Base) MCG/ACT inhaler, Inhale 2 puffs into the lungs every 6 (six) hours as needed for wheezing or shortness of breath., Disp: 1 Inhaler, Rfl: 5 .  diclofenac sodium (VOLTAREN) 1 % GEL, Apply 2 g topically 4 (four) times daily., Disp: , Rfl:  .  ferrous sulfate 325 (65 FE) MG tablet, Take by mouth., Disp: , Rfl:  .  gabapentin (NEURONTIN) 300 MG capsule, Take 300 mg by mouth 3 (three) times daily., Disp: , Rfl:  .  ibuprofen (ADVIL,MOTRIN) 200 MG tablet, Take 200 mg by mouth every 6 (six) hours as needed for moderate pain. , Disp: , Rfl:  .  mycophenolate (CELLCEPT) 500 MG tablet, Take 2 tablets (1,000 mg total) by mouth 2 (two) times daily., Disp: 120 tablet, Rfl: 5 .  oxyCODONE-acetaminophen (PERCOCET/ROXICET) 5-325 MG tablet, Take 1 tablet by mouth 2 (two) times daily as needed for severe pain., Disp: , Rfl:  .  predniSONE (DELTASONE) 1 MG tablet, Take 3 mg by mouth daily with breakfast. , Disp: , Rfl:  .  Vitamin D, Ergocalciferol, (DRISDOL) 50000 units CAPS capsule, Take 50,000 Units by mouth every 7 (seven) days., Disp: , Rfl:     Review of Systems     Objective:   Physical Exam  Constitutional: She is oriented to person, place, and time. She appears well-developed and well-nourished. No distress.  HENT:  Head: Normocephalic  and atraumatic.  Right Ear: External ear normal.  Left Ear: External ear normal.  Mouth/Throat: Oropharynx is clear and moist. No oropharyngeal exudate.  Eyes: Conjunctivae and EOM are normal. Pupils are equal, round, and reactive to light. Right  eye exhibits no discharge. Left eye exhibits no discharge. No scleral icterus.  Neck: Normal range of motion. Neck supple. No JVD present. No tracheal deviation present. No thyromegaly present.  Cardiovascular: Normal rate, regular rhythm, normal heart sounds and intact distal pulses.  Exam reveals no gallop and no friction rub.   No murmur heard. Pulmonary/Chest: Effort normal. No respiratory distress. She has no wheezes. She has rales. She exhibits no tenderness.  Abdominal: Soft. Bowel sounds are normal. She exhibits no distension and no mass. There is no tenderness. There is no rebound and no guarding.  Musculoskeletal: Normal range of motion. She exhibits no edema or tenderness.  Lymphadenopathy:    She has no cervical adenopathy.  Neurological: She is alert and oriented to person, place, and time. She has normal reflexes. No cranial nerve deficit. She exhibits normal muscle tone. Coordination normal.  Skin: Skin is warm and dry. No rash noted. She is not diaphoretic. No erythema. No pallor.  Psychiatric: She has a normal mood and affect. Her behavior is normal. Judgment and thought content normal.  Vitals reviewed.   Vitals:   05/29/16 1004  BP: (!) 144/98  Pulse: 77  SpO2: 98%  Weight: 236 lb 6.4 oz (107.2 kg)  Height: 5\' 11"  (1.803 m)        Assessment:       ICD-9-CM ICD-10-CM   1. Chronic cough 786.2 R05   2. ILD (interstitial lung disease) (HCC) 515 J84.9   3. High risk medication use V58.69 Z79.899        Plan:       - Interstitial lung stable April 2016 through Nov 2017 - So, unclear why cough is worse - it seems dry and from your lungs - Blood work CBC, chemistry, LFT 04/15/2016 were normal  PLAN - try aruity  sample for 8 weeks - continue cellcept and prednisone  Followup - 8 weeks with response to arnuity  - if not helping then consider bronch with lavage for opportunistic infection (despite fact PFT better and CT stable April 2016 through Nov 2017)   Dr. Dec 2017, M.D., F.C.C.P Pulmonary and Critical Care Medicine Staff Physician [facility] Pager: [phone], If no answer or between  15:00h - 7:00h: call [phone]  05/29/2016 10:28 AM

## 2016-05-29 NOTE — Addendum Note (Signed)
Addended by: Garfield Cornea L on: 05/29/2016 11:05 AM   Modules accepted: Orders

## 2016-05-29 NOTE — Patient Instructions (Addendum)
ICD-9-CM ICD-10-CM   1. Chronic cough 786.2 R05   2. ILD (interstitial lung disease) (HCC) 515 J84.9   3. High risk medication use V58.69 Z79.899      - Interstitial lung stable April 2016 through Nov 2017 - So, unclear why cough is worse - it seems dry and from your lungs - Blood work CBC, chemistry, LFT 04/15/2016 were normal  PLAN - try aruity sample for 8 weeks - continue cellcept and prednisone  Followup - 8 weeks with response to arnuity  - if not helping then consider bronch with lavage for opportunistic infection (despite fact PFT better and CT stable April 2016 through Nov 2017)

## 2016-07-24 ENCOUNTER — Encounter: Payer: Self-pay | Admitting: Internal Medicine

## 2016-07-24 ENCOUNTER — Ambulatory Visit (INDEPENDENT_AMBULATORY_CARE_PROVIDER_SITE_OTHER): Payer: 59 | Admitting: Internal Medicine

## 2016-07-24 VITALS — BP 124/90 | HR 74 | Ht 71.0 in | Wt 232.0 lb

## 2016-07-24 DIAGNOSIS — Z79899 Other long term (current) drug therapy: Secondary | ICD-10-CM

## 2016-07-24 DIAGNOSIS — R05 Cough: Secondary | ICD-10-CM

## 2016-07-24 DIAGNOSIS — J849 Interstitial pulmonary disease, unspecified: Secondary | ICD-10-CM | POA: Diagnosis not present

## 2016-07-24 DIAGNOSIS — R053 Chronic cough: Secondary | ICD-10-CM

## 2016-07-24 MED ORDER — FLUTICASONE FUROATE 200 MCG/ACT IN AEPB: 1.0000 | INHALATION_SPRAY | Freq: Every day | RESPIRATORY_TRACT | 11 refills | Status: DC

## 2016-07-24 NOTE — Addendum Note (Signed)
Addended by: Sheran Luz on: 07/24/2016 11:07 AM   Modules accepted: Orders

## 2016-07-24 NOTE — Progress Notes (Signed)
Subjective:     Patient ID: Shannon [name], female   DOB: 10-20-1970, 46 y.o.   MRN: [id number]  HPI  PCP [name],[name], PA-C Referred by Dr [name]  HPI   IOV 08/23/2014  Chief Complaint  Patient presents with  . Pulmonary Consult    Pt referred by Dr. Corliss Skains for ILD. Pt c/o SOB with activity and rest, dry cough and chest tightness also with and without activity.    46 year old female referred for evaluation of autoimmune interstitial lung disease. She presents with her husband. In 2010 while living in [place] she reports she was diagnosed with lupus associated with rheumatoid arthritis in her joints. In 2012 she moved to live in [facility] and several months after that started noticing insidious onset of shortness of breath. Local rheumatologist diagnosed her with interstitial lung disease. She was referred to Dr. Herma Carson at [facility] in [facility] and was started on CellCept/prednisone for autoimmune interstitial lung disease he and however, sometime around 2 years ago she ran out of medical insurance and stopped taking these medications. During this time her dyspnea has progressed. It is currently rated as moderate to severe. It is present on exertion and relieved by rest. Even minimal amount of exertion makes her very dyspneic. Now she has insurance and she did see Dr. Frederik Pear locally and she has autoimmune panel lab ordered. In addition exam revealed crackles and there for she's been referred here for reevaluation of interstitial lung disease and dyspnea. Dyspnea is associated with some chest tightness but no chest pain. This no associated dizzine   11/24/2014 Follow UP ILD  [name]  She has autoimmune ILD with RA/Lupus  She was seen 6 weeks ago, restarted on Cellcept.  Previously on cellcept but lost her insurance until recently.  On low dose prednisone 5mg  daily .  Last CT chest 4/4 showed ILD changes similar to 2013. Echo  was ok with EFG 55-60%, nml PAP . In March .   Did not take dapsone ,due to  GP6D deficiency.  Labs ok last week with nml LFT , no sign change in hbg. /wbc.  She is feeling better. Does feel her breathing is some better.  No flare of cough or wheezing.     OV 01/16/2015  Chief Complaint  Patient presents with  . Follow-up    Pt c/o DOE, mild dry cough, and chest tightness when SOB. Pt states the chest tightness has improved).    follow-up interstitial lung disease in the setting of rheumatoid arthritis Follow-up high risk medication use - on CellCept and prednisone since mid April 2016  - Presents with her husband. Both give a history. Overall she is doing better in terms of dyspnea after starting CellCept and prednisone. However the improvement this only moderate. She still left with a residual moderate dyspnea on exertion that is also made worse with bending or heart air and improved with rest and cool air. It is associated with some cough and wheezing. She takes albuterol inhaler which she feels helps only somewhat but not fully and not quickly enough. She is frustrated by this. In addition she's complaining of some associated right lower back paraspinal spasm for which massage gives her relief. He has never attended pulmonary rehabilitation.  - Lab review shows she has had problems with potassium and has had potassium supplementation. Last lab check was 12/08/2014. She is due for lab test right now   OV 03/28/2015  Chief Complaint  Patient presents with  . Follow-up    3 month follow up. Pt states that she is still having some problems with her breathing. Pt c/o of feeling chest tightness, chest pain and cough that is dry. Pt denies wheeze. Pt states that she did trial the Advair and does feel that it helped some.   Follow-up interstitial lung disease secondary to autoimmune process and associated dyspnea that seems out of proportion\  She presents with her husband. She continues  on CellCept and prednisone. In the last visit approximately 2-3 months ago she had out of proportion dyspnea. I gave her some Advair to try. She says this only helped a little bit. Overall she says that dyspnea still persists. It is worse than in the spring 2016 when she started CellCept and prednisone. It is stable since July 2016. It is moderate in intensity. Occurs randomly at rest but also with exertion. Occasionally relieved by rest but also happens at rest. Heat and humidity make it worse. Advair helped a little bit only. This no associated chest tightness or wheezing. She did try and enroll  in pulmonary rehabilitation but could not afford the the startup program and is waiting to hear from them for the maintenance program. She is frustrated by all the symptoms.  Pulmonary function test October 15,016 today FVC 2.55 L/64%, total lung capacity 4 L/65% and diffusion 14.5/42%. Overall no change since April 2016   OV 07/04/2015  Chief Complaint  Patient presents with  . Follow-up    pt. states breathing is baseline. SOB. dry cough. wheezing. occ. chest pain/tightness. feels her airway is blocked.   Follow-up interstitial lung disease admitted autoimmune processes and associated out of proportion dyspnea  She presents again with her husband. She continues on CellCept 2 g twice daily associated with prednisone. She cannot take Bactrim or dapsone due to G6PD deficiency. Last seen in the fall of 2016. She was having out of proportion dyspnea. Rated cardia pulmonary stress test and she could not tolerate this test. She then underwent right heart catheterization mid-November 2016 with Dr. Marca Ancona. Review the tests this is normal. Overall stable but she and husband still continued to complain about this resting dyspnea associated with wheezing. They hear noises in the chest. Sometime she gasps for air even at rest. She feels it comes from the chest but the husband points to the throat. I offered  second opinion at [facility] because of this unusual symptoms but they declined citing distance. She was supposed to attend pulmonary rehabilitation but they cannot afford a $60 co-pay twice a week 8 weeks. Offered ENT evaluation that agreeable but they wanted done in  which is closer logistically. In addition patient is contemplating now applying for disability. 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  Walking desaturation test 185 feet 3 laps and rheumatic: No desaturation    OV 10/12/2015  Chief Complaint  Patient presents with  . Follow-up    PFT today.  breathing is better.  Pt is currently on STD, [employer] approved STD until today, wants to know today if pt is able to return to work, any restrictions.  Pt states that she has been more stable while out of work.      Follow-up interstitial lung disease due to autoimmune processes not otherwise specified  Last seen January 2017. Since then she has been on short-term disability and out of work. She does heavy manual work. The lack of work estimated  dyspnea better. She did pulmonary function test today that I personally visualized and overall it is same. There are no new issues. She is on CellCept and she is tolerating this fine. Last visit she had some anemia we follow this up and the anemia was better. Also hypokalemia resolved by February 2017. She is due for blood work today. 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 This showed up at last visit. She saw an ENT in [place] within referred her to [facility]. She does not want to go to [facility]. She ended up seeing Dr. Ezzard Standing locally. But now she is going to see Dr.  Dina Rich [facility]   Pulmonary function test today 10/12/2015 shows postbronchodilator FVC 2.65 L/67%. FEV1 2.34 L/72% which is up 17% positive bronchodilator response. Ratio of 80/106%. Total  lung capacity of 3.8/62%. DLCO 18.36/52%. Overall consistent with restriction and lung parenchymal disease. Overall PFTs are stable compared to April 2016 but perhaps in DLCO slightly better   OV 04/15/2016  Chief Complaint  Patient presents with  . Interstitial Lung Disease    Breathing is unchanged since last OV. Reports SOB, coughing. Cough is non productive. Denies chest tightness or wheezing.   Follow-up interstitial lung disease due to autoimmune processes not otherwise specified. On CellCept and prednisone. Not on Bactrim prophylaxis due to G6PD deficiency  Last visit April 2017. At that time based on pulmonary function tests showed stability and ILD for a year. 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 She did see local ENT doctor [place] and apparently has been reassured. At this point in time she is applying for long-term disability at work but the work discharged from services and she is now applying for Social Security disability. Her dyspnea stable since the interim. She's also had hysterectomy but for the last few weeks her cough is worse and it is dry. This no fever. Is no weight loss or chills. She says she's compliant with her CellCept and prednisone.  OV 05/29/2016  Chief Complaint  Patient presents with  . Follow-up    Pt states she still has harsh dry cough, pt states her SOB is unchanged and chest tightness when SOB or when coughing a lot. Pt deneis f/c/s.     Follow-up interstitial lung disease due to autoimmune processes not otherwise specified. On CellCept and prednisone since April 2016. Not on Bactrim prophylaxis due to G6PD deficiency  Shannon [name] returns for follow-up. She had high resolution CT chest that shows stability and interstitial lung disease since April 2016. She Pulmicort function test that shows mild improvement since April 2016. Therefore it appears that her CellCept and prednisone is helping her she'll lung disease related  to collagen-vascular disease. However she tells me that she continues to have chronic cough for the last few to several months. It is progressive. It is dry. It is worse than her baseline. The dyspnea is unchanged. This no fever or weight loss or chills   OV 07/24/2016  Chief Complaint  Patient presents with  . Follow-up    Pt states she feels the Arnuity has been helping her breathing. Pt c/o dry cough and occ chest tightness.      Follow-up interstitial lung disease due to autoimmune processes not otherwise specified. On CellCept and prednisone since April 2016- .stable CT scan November 2017 but improved pulmonary function test between April  2016 and November 2017. Not on Bactrim prophylaxis due to G6PD deficiency. Started on inhaled corticosteroid   in November 2017 due to cough is in empiric treatment   S: She tells me that after starting inhaled corticosteroid or dyspnea is continued to improve. In addition cough is also significantly improved. She wants to pulmonary rehabilitation. She still applying for disability. 05/27/2016 she underwent blood work which I reviewed in the system little function test is normal. Complete blood count is normal with a hemoglobin of 11.4 g percent. Creatinine 0.7 mg percent. She continues on CellCept and prednisone and inhaled corticosteroid.  IMPRESSION: All 1. No appreciable interval change in basilar predominant fibrotic interstitial lung disease characterized by patchy subpleural reticulation and ground-glass attenuation, mild traction bronchiolectasis and mild honeycombing. Findings are consistent with collagen vascular disease-related interstitial lung disease, favor fibrotic nonspecific interstitial pneumonia (NSIP) pattern given the stability. 2. Stable symmetric mild axillary lymphadenopathy, consistent with benign reactive adenopathy probably due to collagen vascular disease.   Electronically Signed   By: Delbert Phenix M.D.   On:  04/24/2016 11:58  Results for SHIREL, MALLIS (MRN [id number]) as of 05/29/2016 10:13  Ref. Range 10/09/2014 08:52 03/28/2015 10:46 10/12/2015 13:42 05/06/2016 10:15  FVC-Pre Latest Units: L 2.54 2.55 2.53 2.63  FVC-%Pred-Pre Latest Units: % 64 64 64 67   Results for JAYDALYN, DEMATTIA (MRN [id number]) as of 05/29/2016 10:13  Ref. Range 10/09/2014 08:52 03/28/2015 10:46 10/12/2015 13:42 05/06/2016 10:15  DLCO unc Latest Units: ml/min/mmHg 15.03 14.50 18.36 18.31  DLCO unc % pred Latest Units: % 43 42 53 53     has a past medical history of Anemia; G6PD deficiency (HCC); ILD (interstitial lung disease) (HCC); Lupus; and Rheumatoid arthritis (HCC).   reports that she quit smoking about 21 months ago. Her smoking use included Cigarettes. She has a 7.00 pack-year smoking history. She has never used smokeless tobacco.  Past Surgical History:  Procedure Laterality Date  . APPENDECTOMY  1980  . CARDIAC CATHETERIZATION N/A 05/10/2015   Procedure: Right Heart Cath;  Surgeon: Laurey Morale, MD;  Location: [facility] INVASIVE CV LAB;  Service: Cardiovascular;  Laterality: N/A;  . CESAREAN SECTION  '95, '02, '07   X 3  . CHOLECYSTECTOMY  2010  . LAPAROSCOPIC HYSTERECTOMY  10/2015   have ovaries  . TUBAL LIGATION      Allergies  Allergen Reactions  . Asa [Aspirin]     HIves  . Penicillins Hives  . Sulfa Antibiotics     G6PD deficiency     Immunization History  Administered Date(s) Administered  . Influenza Split 03/23/2014  . Influenza,inj,Quad PF,36+ Mos 03/15/2016  . Pneumococcal-Unspecified 08/23/2014    Family History  Problem Relation Age of Onset  . Sarcoidosis Mother   . Lupus Sister      Current Outpatient Prescriptions:  .  albuterol (PROVENTIL HFA;VENTOLIN HFA) 108 (90 Base) MCG/ACT inhaler, Inhale 2 puffs into the lungs every 6 (six) hours as needed for wheezing or shortness of breath., Disp: 1 Inhaler, Rfl: 5 .  diclofenac sodium (VOLTAREN) 1 % GEL, Apply 2 g topically 4 (four)  times daily., Disp: , Rfl:  .  ferrous sulfate 325 (65 FE) MG tablet, Take by mouth., Disp: , Rfl:  .  Fluticasone Furoate (ARNUITY ELLIPTA) 200 MCG/ACT AEPB, Inhale 1 puff into the lungs daily., Disp: 28 each, Rfl: 0 .  gabapentin (NEURONTIN) 300 MG capsule, Take 300 mg by mouth 3 (three) times daily., Disp: , Rfl:  .  ibuprofen (ADVIL,MOTRIN) 200 MG tablet, Take 200 mg by mouth every 6 (six) hours as needed for moderate pain. , Disp: , Rfl:  .  mycophenolate (CELLCEPT) 500 MG tablet, Take 2 tablets (1,000 mg total) by mouth 2 (two) times daily., Disp: 120 tablet, Rfl: 5 .  oxyCODONE-acetaminophen (PERCOCET/ROXICET) 5-325 MG tablet, Take 1 tablet by mouth 2 (two) times daily as needed for severe pain., Disp: , Rfl:  .  predniSONE (DELTASONE) 1 MG tablet, Take 3 mg by mouth daily with breakfast. , Disp: , Rfl:    Review of Systems     Objective:   Physical Exam  Constitutional: She is oriented to person, place, and time. She appears well-developed and well-nourished. No distress.  HENT:  Head: Normocephalic and atraumatic.  Right Ear: External ear normal.  Left Ear: External ear normal.  Mouth/Throat: Oropharynx is clear and moist. No oropharyngeal exudate.  Eyes: Conjunctivae and EOM are normal. Pupils are equal, round, and reactive to light. Right eye exhibits no discharge. Left eye exhibits no discharge. No scleral icterus.  Neck: Normal range of motion. Neck supple. No JVD present. No tracheal deviation present. No thyromegaly present.  Cardiovascular: Normal rate, regular rhythm, normal heart sounds and intact distal pulses.  Exam reveals no gallop and no friction rub.   No murmur heard. Pulmonary/Chest: Effort normal. No respiratory distress. She has no wheezes. She has rales. She exhibits no tenderness.  Mild basal crackles present  Abdominal: Soft. Bowel sounds are normal. She exhibits no distension and no mass. There is no tenderness. There is no rebound and no guarding.   Musculoskeletal: Normal range of motion. She exhibits no edema or tenderness.  Lymphadenopathy:    She has no cervical adenopathy.  Neurological: She is alert and oriented to person, place, and time. She has normal reflexes. No cranial nerve deficit. She exhibits normal muscle tone. Coordination normal.  Skin: Skin is warm and dry. No rash noted. She is not diaphoretic. No erythema. No pallor.  Psychiatric: She has a normal mood and affect. Her behavior is normal. Judgment and thought content normal.  Vitals reviewed.  Vitals:   07/24/16 1046  BP: 124/90  Pulse: 74  SpO2: 92%  Weight: 232 lb (105.2 kg)  Height: 5\' 11"  (1.803 m)    Estimated body mass index is 32.36 kg/m as calculated from the following:   Height as of this encounter: 5\' 11"  (1.803 m).   Weight as of this encounter: 232 lb (105.2 kg).     Assessment:       ICD-9-CM ICD-10-CM   1. ILD (interstitial lung disease) (HCC) 515 J84.9   2. Chronic cough 786.2 R05   3. High risk medication use V58.69 Z79.899        Plan:      Clinically improved with cellcept/prednisone and arnuity Blood work ok dec 2017  pLAN - start pulm rehab at [facility] - continue  aruity daily - continue cellcept and prednisone  Followup - 6 months do Pre-bd spiro and dlco only. No lung volume or bd response. No post-bd spiro - 6 months fu Dr Marchelle Gearing after above or sooner if needed  Dr. Kalman Shan, M.D., [facility].C.P Pulmonary and Critical Care Medicine Staff Physician [facility] Pager: [phone], If no answer or between  15:00h - 7:00h: call [phone]  07/24/2016 11:00 AM

## 2016-07-24 NOTE — Patient Instructions (Addendum)
    ICD-9-CM ICD-10-CM   1. ILD (interstitial lung disease) (HCC) 515 J84.9   2. Chronic cough 786.2 R05   3. High risk medication use V58.69 Z79.899    Clinically improved with cellcept/prednisone and arnuity Blood work ok dec 2017  pLAN - start pulm rehab at [facility] - continue  aruity daily - continue cellcept and prednisone  Followup - 6 months do Pre-bd spiro and dlco only. No lung volume or bd response. No post-bd spiro - 6 months fu Dr Marchelle Gearing after above or sooner if needed

## 2016-07-28 ENCOUNTER — Telehealth: Payer: Self-pay | Admitting: Internal Medicine

## 2016-07-28 MED ORDER — FLUTICASONE FUROATE 200 MCG/ACT IN AEPB: 1.0000 | INHALATION_SPRAY | Freq: Every day | RESPIRATORY_TRACT | 3 refills | Status: DC

## 2016-07-28 NOTE — Telephone Encounter (Signed)
Spoke with pt. She is needing her Arunity prescription sent to [facility]. This has been done. Nothing further was needed.

## 2016-08-01 DIAGNOSIS — R768 Other specified abnormal immunological findings in serum: Secondary | ICD-10-CM | POA: Insufficient documentation

## 2016-08-01 NOTE — Progress Notes (Signed)
Office Visit Note  Patient: Shannon [name]             Date of Birth: 10-Jan-1971           MRN: [id number]             PCP: Sheppard Evens Referring: Janine Limbo, PA-C Visit Date: 08/05/2016 Occupation: _0 @    Subjective:  Pain of the Right Hand; Pain of the Right Knee; Pain of the Left Knee; Pain of the Lower Back; and Follow-up Follow-up from 05/27/2016 visit.  History of Present Illness: Shannon [name] is a 46 y.o. female  Follow-up on lupus. Patient is uncertain with all the various medical problems that she has what is responsible for what discomfort. She wants to review her rheumatological medical problems with our office.  In addition she is having ongoing pain to the right knee. She was here December 2017 for regular visit and at that time she reported that she had fallen down and she had knee injury. We've done an x-ray which proved to be negative for any fractures. We gave her cortisone injection at that visit. She states that it helped for the first few hours and maybe the first few days but then the pain came back. She also reports that both knees hurt, the right knee is little bit worse in the left knee but both knees 1 day or another day and up hurting her.  She is also having numbness and tingling in her right hand. She points to her right dorsal wrist and states that it extends into her fingers from her wrist. She is describing numbness and tingling.   Activities of Daily Living:  Patient reports morning stiffness for 30 minutes.   Patient Reports nocturnal pain.  Difficulty dressing/grooming: Denies Difficulty climbing stairs: Reports Difficulty getting out of chair: Reports Difficulty using hands for taps, buttons, cutlery, and/or writing: Reports   Review of Systems  Constitutional: Negative for fatigue.  HENT: Negative for mouth sores and mouth dryness.   Eyes: Negative for dryness.  Respiratory: Negative for shortness of breath.     Gastrointestinal: Negative for constipation and diarrhea.  Musculoskeletal: Negative for myalgias and myalgias.  Skin: Negative for sensitivity to sunlight.  Psychiatric/Behavioral: Negative for decreased concentration and sleep disturbance.    PMFS History:  Patient Active Problem List   Diagnosis Date Noted  . Rheumatoid factor positive 08/01/2016  . High risk medication use 01/16/2015  . G6PD deficiency ([place]) 10/31/2014  . Dyspnea 08/23/2014  . ILD (interstitial lung disease) ([place]) 08/23/2014  . Lupus 08/23/2014  . Other organ or system involvement in systemic lupus erythematosus ([place]) 08/23/2014    Past Medical History:  Diagnosis Date  . Anemia   . G6PD deficiency ([place])   . ILD (interstitial lung disease) ([place])   . Lupus   . Rheumatoid arthritis ([place])     Family History  Problem Relation Age of Onset  . Sarcoidosis Mother   . Lupus Sister    Past Surgical History:  Procedure Laterality Date  . APPENDECTOMY  1980  . CARDIAC CATHETERIZATION N/A 05/10/2015   Procedure: Right Heart Cath;  Surgeon: Larey Dresser, MD;  Location: [facility];  Service: Cardiovascular;  Laterality: N/A;  . CESAREAN SECTION  '95, '02, '07   X 3  . CHOLECYSTECTOMY  2010  . LAPAROSCOPIC HYSTERECTOMY  10/2015   have ovaries  . TUBAL LIGATION     Social History   Social History Narrative  Married, lives with husband, children   Caffeine- coffee  1 daily     Objective: Vital Signs: BP 136/90   Pulse 80   Resp 16   Ht 5' 11" (1.803 m)   Wt 230 lb (104.3 kg)   LMP 04/28/2015   BMI 32.08 kg/m    Physical Exam  Constitutional: She is oriented to person, place, and time. She appears well-developed and well-nourished.  HENT:  Head: Normocephalic and atraumatic.  Eyes: EOM are normal. Pupils are equal, round, and reactive to light.  Cardiovascular: Normal rate, regular rhythm and normal heart sounds.  Exam reveals no gallop and no friction rub.   No murmur  heard. Pulmonary/Chest: Effort normal and breath sounds normal. She has no wheezes. She has no rales.  Abdominal: Soft. Bowel sounds are normal. She exhibits no distension. There is no tenderness. There is no guarding. No hernia.  Musculoskeletal: Normal range of motion. She exhibits no edema, tenderness or deformity.  Lymphadenopathy:    She has no cervical adenopathy.  Neurological: She is alert and oriented to person, place, and time. Coordination normal.  Skin: Skin is warm and dry. Capillary refill takes less than 2 seconds. No rash noted.  Psychiatric: She has a normal mood and affect. Her behavior is normal.  Nursing note and vitals reviewed.    Musculoskeletal Exam:  Full range of motion of all joints Grip strength is equal and strong bilaterally Fibromyalgia tender points are all absent  CDAI Exam: CDAI Homunculus Exam:   Joint Counts:  CDAI Tender Joint count: 0 CDAI Swollen Joint count: 0  No synovitis on examination. But patient states that her bilateral MCP joints do tend to hurt and feel swollen at times.   Investigation: Findings:  January 2016:  CBC showed hemoglobin of 10.1.  Comprehensive metabolic panel was normal.  UA showed esterase  08/21/2014  We did obtain an x-ray of bilateral hands, 2 views today, which showed bilateral PIP narrowing.  No MCP joint narrowing was noted.  No erosive changes were noted.  Bilateral elbow joint x-rays showed some joint space narrowing without any erosive changes.  Bilateral knee joint x-rays, 2 views, showed moderate medial compartment narrowing, bilateral moderate patellofemoral narrowing without any chondrocalcinosis, consistent with moderate osteoarthritis.   March 2016:  CBC showed WBC count of 3.  Hemoglobin 9.7.  Platelets were normal.  Comprehensive metabolic panel was normal.  Sed rate was elevated at 36.  ACE level was negative.  UA was negative.  CK and TSH were normal.  Rheumatoid factor was 65.  CCP was negative. ANA  was 1:1280 nuclear speckled pattern.  She has positive Smith and positive RNP antibody.  Beta-2 anticardiolipin and lupus anticoagulant was negative.  Hepatitis panel, HIV and SPEP were normal.  Immunoglobulin G elevated.  TB Gold was negative.     Office Visit on 05/27/2016  Component Date Value Ref Range Status  . WBC 05/27/2016 3.1* 3.8 - 10.8 K/uL Final  . RBC 05/27/2016 3.90  3.80 - 5.10 MIL/uL Final  . Hemoglobin 05/27/2016 11.4* 11.7 - 15.5 g/dL Final  . HCT 05/27/2016 36.0  35.0 - 45.0 % Final  . MCV 05/27/2016 92.3  80.0 - 100.0 fL Final  . MCH 05/27/2016 29.2  27.0 - 33.0 pg Final  . MCHC 05/27/2016 31.7* 32.0 - 36.0 g/dL Final  . RDW 05/27/2016 13.7  11.0 - 15.0 % Final  . Platelets 05/27/2016 239  140 - 400 K/uL Final  . MPV 05/27/2016 11.2  7.5 - 12.5 fL Final  . Neutro Abs 05/27/2016 1798  1,500 - 7,800 cells/uL Final  . Lymphs Abs 05/27/2016 1054  850 - 3,900 cells/uL Final  . Monocytes Absolute 05/27/2016 186* 200 - 950 cells/uL Final  . Eosinophils Absolute 05/27/2016 62  15 - 500 cells/uL Final  . Basophils Absolute 05/27/2016 0  0 - 200 cells/uL Final  . Neutrophils Relative % 05/27/2016 58  % Final  . Lymphocytes Relative 05/27/2016 34  % Final  . Monocytes Relative 05/27/2016 6  % Final  . Eosinophils Relative 05/27/2016 2  % Final  . Basophils Relative 05/27/2016 0  % Final  . Smear Review 05/27/2016 Criteria for review not met   Final  . Sodium 05/27/2016 138  135 - 146 mmol/L Final  . Potassium 05/27/2016 3.8  3.5 - 5.3 mmol/L Final  . Chloride 05/27/2016 104  98 - 110 mmol/L Final  . CO2 05/27/2016 25  20 - 31 mmol/L Final  . Glucose, Bld 05/27/2016 78  65 - 99 mg/dL Final  . BUN 05/27/2016 11  7 - 25 mg/dL Final  . Creat 05/27/2016 0.71  0.50 - 1.10 mg/dL Final  . Total Bilirubin 05/27/2016 0.3  0.2 - 1.2 mg/dL Final  . Alkaline Phosphatase 05/27/2016 64  33 - 115 U/L Final  . AST 05/27/2016 17  10 - 30 U/L Final  . ALT 05/27/2016 13  6 - 29 U/L Final  .  Total Protein 05/27/2016 7.8  6.1 - 8.1 g/dL Final  . Albumin 05/27/2016 4.1  3.6 - 5.1 g/dL Final  . Calcium 05/27/2016 9.0  8.6 - 10.2 mg/dL Final  . GFR, Est African American 05/27/2016 >89  >=60 mL/min Final  . GFR, Est Non African American 05/27/2016 >89  >=60 mL/min Final  . Vit D, 25-Hydroxy 05/27/2016 48  30 - 100 ng/mL Final   Comment: Vitamin D Status           25-OH Vitamin D        Deficiency                <20 ng/mL        Insufficiency         20 - 29 ng/mL        Optimal             > or = 30 ng/mL   For 25-OH Vitamin D testing on patients on D2-supplementation and patients for whom quantitation of D2 and D3 fractions is required, the QuestAssureD 25-OH VIT D, (D2,D3), LC/MS/MS is recommended: order code 85814 (patients > 2 yrs).   Appointment on 05/06/2016  Component Date Value Ref Range Status  . FVC-Pre 05/06/2016 2.63  L Final  . FVC-%Pred-Pre 05/06/2016 67  % Final  . FEV1-Pre 05/06/2016 2.10  L Final  . FEV1-%Pred-Pre 05/06/2016 65  % Final  . FEV6-Pre 05/06/2016 2.62  L Final  . FEV6-%Pred-Pre 05/06/2016 68  % Final  . Pre FEV1/FVC ratio 05/06/2016 80  % Final  . FEV1FVC-%Pred-Pre 05/06/2016 95  % Final  . Pre FEV6/FVC Ratio 05/06/2016 99  % Final  . FEV6FVC-%Pred-Pre 05/06/2016 101  % Final  . FEF 25-75 Pre 05/06/2016 2.06  L/sec Final  . FEF2575-%Pred-Pre 05/06/2016 63  % Final  . DLCO unc 05/06/2016 18.31  ml/min/mmHg Final  . DLCO unc % pred 05/06/2016 53  % Final  . DLCO cor 05/06/2016 19.41  ml/min/mmHg Final  . DLCO cor % pred 05/06/2016 56  %   Final  . DL/VA 05/06/2016 5.23  ml/min/mmHg/L Final  . DL/VA % pred 05/06/2016 94  % Final    Imaging: No results found.  Speciality Comments: No specialty comments available.    Procedures:  No procedures performed Allergies: Asa [aspirin]; Penicillins; and Sulfa antibiotics   Assessment / Plan:     Visit Diagnoses: Other systemic lupus erythematosus with lung involvement (HCC) - Rheumatoid factor  was 65.  CCP was negative. ANA was 1:1280 nuclear speckled pattern.  She has positive Smith and positive RNP antibody  Other organ or system involvement in systemic lupus erythematosus (HCC)  ILD (interstitial lung disease) (HCC)  G6PD deficiency (HCC)  High risk medication use - Plan: CBC with Differential/Platelet, COMPLETE METABOLIC PANEL WITH GFR  Rheumatoid factor positive  Pain in right hand - Plan: Ambulatory referral to Physical Medicine Rehab  Pain in left hand - Plan: Ambulatory referral to Physical Medicine Rehab   Plan: #1: Systemic lupus erythematosus. Patient states lately she's been having more flares. This includes more oral ulcers. She is also noticed a rash on her body on her face on her chest. She is currently using CellCept 1000 mg in the morning and 1000 mg in the evening. This is been prescribed for her interstitial lung disease and for the most part it was working well for her lupus also. Based on her current symptoms, CellCept may not be effective for her at this time and we may want to consider making some treatment plan changes.  #2: Patient is having some right carpal tunnel like symptoms. She states that she has numbness and tingling extending from her wrist to her first second and third finger. Patient also reports that she is having some early symptoms in the left wrist extending to her fingers. I will set up the patient for nerve conduction velocity testing through Dr. Newman's office.  #3: She is also complaining about bilateral knee pain. She initially had injury in her right knee joint for which I did x-rays in December 2017. Now she is having pain ongoing to both of her knees and ankles from 1 need to the other. She has a history of osteoarthritis of the knees and I think she would benefit from Visco supplementation. She is agreeable to move forward with applying and getting approval for those knee injections and I'll go ahead and do that  today  #4: Right paraspinous lumbar back pain about L3 L2 level. No falls or injuries. Muscles feel really tight on examination Patient does not have a muscle relaxer and I will prescribe her Robaxin 500 mg 1 up to 3 times a day as needed for muscle relaxing agent. 30 day supply  #5: I believe the patient is failing CellCept based on her current flare of symptoms.(ongoing oral ulcers; joint pain and swelling; SOB (will do pulmonary rehab); malar rash to face; rash to chest off and on) I will discuss with Dr. Deveshwar are looking into this and seeing if we can offer patient any additional treatment. I have asked the patient to return back in 3 months so we can have this discussion. In the meanwhile we'll try to help her with her knees and her carpal tunnel and her back and see if we can get those items better.  I sent this message to dr. D =====> Patient has history of lupus. She's currently on CellCept for her lupus as well as infectious to lung disease. She is also taking daily prednisone 3 mg.  She states   that she is having more oral ulcers, New rashes to her face as well as her chest. I wonder if she is failing her CellCept. She additionally has some joint pain in her MCP joints bilaterally but no synovitis or synovial thickening.  She is going to start pulmonary rehabilitation because of some shortness of breath. I've asked her to come back in 3 months from her 08/05/2016 visit so we can discuss what treatment options might be a good idea and see how she did with her pulmonary rehabilitation.  #5: Return to clinic in 3 months  #6: Patient seems to be having carpal tunnel symptoms that need to be evaluated through Dr. Newman's office, we'll refer her there for evaluation and treatment. We'll make treatment plan decisions once we get  #7: Patient will need updated labs today including CBC with differential and CMP with GFR.  Orders: Orders Placed This Encounter  Procedures   . CBC with Differential/Platelet  . COMPLETE METABOLIC PANEL WITH GFR  . Ambulatory referral to Physical Medicine Rehab   Meds ordered this encounter  Medications  . methocarbamol (ROBAXIN) 500 MG tablet    Sig: Take 1 tablet (500 mg total) by mouth every 8 (eight) hours as needed for muscle spasms.    Dispense:  90 tablet    Refill:  2    Order Specific Question:   Supervising Provider    Answer:   ,  [[id number]]    Face-to-face time spent with patient was 40 minutes. 50% of time was spent in counseling and coordination of care.  Follow-Up Instructions: Return in about 3 months (around 11/02/2016) for sle, bil knee pain --> OA--> needs visco; back pain; failing cellcept.    , PA-C  Note - This record has been created using Dragon software.  Chart creation errors have been sought, but may not always  have been located. Such creation errors do not reflect on  the standard of medical care. 

## 2016-08-05 ENCOUNTER — Encounter: Payer: Self-pay | Admitting: Rheumatology

## 2016-08-05 ENCOUNTER — Ambulatory Visit (INDEPENDENT_AMBULATORY_CARE_PROVIDER_SITE_OTHER): Payer: 59 | Admitting: Rheumatology

## 2016-08-05 VITALS — BP 136/90 | HR 80 | Resp 16 | Ht 71.0 in | Wt 230.0 lb

## 2016-08-05 DIAGNOSIS — D550 Anemia due to glucose-6-phosphate dehydrogenase [G6PD] deficiency: Secondary | ICD-10-CM

## 2016-08-05 DIAGNOSIS — M3219 Other organ or system involvement in systemic lupus erythematosus: Secondary | ICD-10-CM | POA: Diagnosis not present

## 2016-08-05 DIAGNOSIS — J849 Interstitial pulmonary disease, unspecified: Secondary | ICD-10-CM

## 2016-08-05 DIAGNOSIS — M79642 Pain in left hand: Secondary | ICD-10-CM

## 2016-08-05 DIAGNOSIS — Z79899 Other long term (current) drug therapy: Secondary | ICD-10-CM | POA: Diagnosis not present

## 2016-08-05 DIAGNOSIS — D75A Glucose-6-phosphate dehydrogenase (G6PD) deficiency without anemia: Secondary | ICD-10-CM

## 2016-08-05 DIAGNOSIS — M79641 Pain in right hand: Secondary | ICD-10-CM | POA: Diagnosis not present

## 2016-08-05 DIAGNOSIS — R768 Other specified abnormal immunological findings in serum: Secondary | ICD-10-CM | POA: Diagnosis not present

## 2016-08-05 DIAGNOSIS — M3213 Lung involvement in systemic lupus erythematosus: Secondary | ICD-10-CM

## 2016-08-05 MED ORDER — METHOCARBAMOL 500 MG PO TABS: 500.0000 mg | ORAL_TABLET | Freq: Three times a day (TID) | ORAL | 2 refills | Status: AC | PRN

## 2016-08-07 DIAGNOSIS — Z0271 Encounter for disability determination: Secondary | ICD-10-CM

## 2016-08-14 ENCOUNTER — Encounter (INDEPENDENT_AMBULATORY_CARE_PROVIDER_SITE_OTHER): Payer: Self-pay | Admitting: Physical Medicine and Rehabilitation

## 2016-08-14 ENCOUNTER — Ambulatory Visit (INDEPENDENT_AMBULATORY_CARE_PROVIDER_SITE_OTHER): Payer: 59 | Admitting: Physical Medicine and Rehabilitation

## 2016-08-14 ENCOUNTER — Encounter (INDEPENDENT_AMBULATORY_CARE_PROVIDER_SITE_OTHER): Payer: Self-pay

## 2016-08-14 DIAGNOSIS — R202 Paresthesia of skin: Secondary | ICD-10-CM

## 2016-08-14 NOTE — Progress Notes (Deleted)
   Shannon [name] - 46 y.o. female MRN [id number]  Date of birth: 19-Jun-1971  Office Visit Note: Visit Date: 08/14/2016 PCP: Otila Back Referred by: Eunice Blase, PA-C  Subjective: No chief complaint on file.  HPI: HPI  Bilateral upper extremity- left hand dominant Numbness and tingling, some pain in hands right worse than left. Worse through palm and into third and fourth fingers. Hurts when holding something. Comes and goes- not worse at any specific time. Has had symptoms for a while- getting worse over the last 4 or 5 months. ROS Otherwise per HPI.  Assessment & Plan: Visit Diagnoses: No diagnosis found.  Plan: No additional findings.   Meds & Orders: No orders of the defined types were placed in this encounter.  No orders of the defined types were placed in this encounter.   Follow-up: No Follow-up on file.   Procedures: No procedures performed  No notes on file   Clinical History: No specialty comments available.  She reports that she quit smoking about 22 months ago. Her smoking use included Cigarettes. She has a 7.00 pack-year smoking history. She has never used smokeless tobacco.  Recent Labs  12/19/15 1347  HGBA1C 4.9    Objective:  VS:  HT:    WT:   BMI:     BP:   HR: bpm  TEMP: ( )  RESP:  Physical Exam  Ortho Exam Imaging: No results found.  Past Medical/Family/Surgical/Social History: Medications & Allergies reviewed per EMR Patient Active Problem List   Diagnosis Date Noted  . Rheumatoid factor positive 08/01/2016  . High risk medication use 01/16/2015  . G6PD deficiency ([facility]) 10/31/2014  . Dyspnea 08/23/2014  . ILD (interstitial lung disease) ([facility]) 08/23/2014  . Lupus 08/23/2014  . Other organ or system involvement in systemic lupus erythematosus ([facility]) 08/23/2014   Past Medical History:  Diagnosis Date  . Anemia   . G6PD deficiency ([facility])   . ILD (interstitial lung disease) ([facility])   . Lupus   . Rheumatoid arthritis ([facility])     Family History  Problem Relation Age of Onset  . Sarcoidosis Mother   . Lupus Sister    Past Surgical History:  Procedure Laterality Date  . APPENDECTOMY  1980  . CARDIAC CATHETERIZATION N/A 05/10/2015   Procedure: Right Heart Cath;  Surgeon: Laurey Morale, MD;  Location: [facility];  Service: Cardiovascular;  Laterality: N/A;  . CESAREAN SECTION  '95, '02, '07   X 3  . CHOLECYSTECTOMY  2010  . LAPAROSCOPIC HYSTERECTOMY  10/2015   have ovaries  . TUBAL LIGATION     Social History   Occupational History  . [employer]    12/19/15 applying for SS   Social History Main Topics  . Smoking status: Former Smoker    Packs/day: 0.50    Years: 14.00    Types: Cigarettes    Quit date: 09/25/2014  . Smokeless tobacco: Never Used  . Alcohol use No  . Drug use: No  . Sexual activity: Not on file

## 2016-08-14 NOTE — Progress Notes (Signed)
Shannon [name] - 46 y.o. female MRN [id number]  Date of birth: Apr 07, 1971  Office Visit Note: Visit Date: 08/14/2016 PCP: Otila Back Referred by: Shannon Blase, PA-C  Subjective: Chief Complaint  Patient presents with  . Right Hand - Pain, Numbness  . Left Hand - Pain   HPI: Shannon [name] is a 46 year old left-hand dominant female with history of lupus diagnosed in 2010. Patient has history of rheumatoid arthritis, Raynaud's phenomenon, interstitial lung disease, G6PD deficiency, anemia. Patient under treatment of rheumatologist and pulmonologist with immune suppressant therapy. She is followed by Dr. Titus Dubin. She comes in today with pain numbness and tingling in both hands. She is left-hand dominant. She feels like her right hand is worse than the left. She predominantly states the numbness and tingling is worse when she does get some pain as well. The symptoms are worse through the palm and into the third and fourth fingers. She does get more pain when holding something. She can have the symptoms come and go at times and is not worse at any specific time her movement. She reports chronic symptoms but is been worse over the last 4 or 5 months. She is on opioid medications at times. She is taking gabapentin. She has not had prior electrodiagnostic studies. She does concurrently see Dr. Marjory Lies for memory loss symptoms.    ROS Otherwise per HPI.  Assessment & Plan: Visit Diagnoses:  1. Paresthesia of skin     Plan: No additional findings.  Impression: NORMAL electrodiagnostic study of both upper limbs.  There is no electrodiagnostic evidence of nerve entrapment, cervical radiculopathy.  This electrodiagnostic study cannot rule out small fiber polyneuropathy and dysesthesias from central pain sensitization syndromes such as fibromyalgia.  Recommendations: 1.  Follow-up with referring physician. 2.  Continue current management of symptoms.    Meds & Orders: No orders of the  defined types were placed in this encounter.  No orders of the defined types were placed in this encounter.   Follow-up: Return for Scheduled follow-up with Melina Copa, PA.   Procedures: No procedures performed  Electrodiagnostic results:  All nerve conduction studies (as indicated in the following tables) were within normal limits.  All left vs. right side differences were within normal limits.    The muscle scoring table definition stored in the current test does not match the sentence generator setup.  The sentence could not be generated.  Impression: NORMAL electrodiagnostic study of both upper limbs.  There is no electrodiagnostic evidence of nerve entrapment, cervical radiculopathy.  This electrodiagnostic study cannot rule out small fiber polyneuropathy and dysesthesias from central pain sensitization syndromes such as fibromyalgia.  Recommendations: 1.  Follow-up with referring physician. 2.  Continue current management of symptoms.   Nerve Conduction Studies Anti Sensory Summary Table   Stim Site NR Peak (ms) Norm Peak (ms) P-T Amp (V) Norm P-T Amp Site1 Site2 Delta-P (ms) Dist (cm) Vel (m/s) Norm Vel (m/s)  Left Median Anti Sensory (2nd Digit)  31.1C  Wrist    3.4 <3.6 39.8 >10 Wrist 2nd Digit 3.4 14.0 41 >39  Right Median Acr Palm Anti Sensory (2nd Digit)  30.9C  Wrist    3.4 <3.6 37.8 >10 Wrist Palm 1.6 0.0    Palm    1.8 <2.0 40.1         Left Radial Anti Sensory (Base 1st Digit)  32.8C  Wrist    1.9 <3.1 34.1  Wrist Base 1st Digit 1.9 0.0    Right Radial  Anti Sensory (Base 1st Digit)  31.5C  Wrist    1.9 <3.1 23.3  Wrist Base 1st Digit 1.9 0.0    Left Ulnar Anti Sensory (5th Digit)  31.5C  Wrist    3.0 <3.7 28.0 >15.0 Wrist 5th Digit 3.0 14.0 47 >38  Right Ulnar Anti Sensory (5th Digit)  31C  Wrist    3.1 <3.7 32.5 >15.0 Wrist 5th Digit 3.1 14.0 45 >38   Motor Summary Table   Stim Site NR Onset (ms) Norm Onset (ms) O-P Amp (mV) Norm O-P Amp Site1  Site2 Delta-0 (ms) Dist (cm) Vel (m/s) Norm Vel (m/s)  Left Median Motor (Abd Poll Brev)  32.8C  Wrist    3.2 <4.2 13.7 >5 Elbow Wrist 4.7 27.0 57 >50  Elbow    7.9  11.2         Right Median Motor (Abd Poll Brev)  31.3C  Wrist    3.3 <4.2 11.6 >5 Elbow Wrist 4.6 26.0 57 >50  Elbow    7.9  5.4         Left Ulnar Motor (Abd Dig Min)  32.1C  Wrist    2.7 <4.2 6.8 >3 B Elbow Wrist 4.6 26.0 57 >53  B Elbow    7.3  4.2  A Elbow B Elbow 1.3 10.5 81 >53  A Elbow    8.6  6.6         Right Ulnar Motor (Abd Dig Min)  31C  Wrist    2.6 <4.2 8.6 >3 B Elbow Wrist 4.3 27.0 63 >53  B Elbow    6.9  7.2  A Elbow B Elbow 1.5 11.0 73 >53  A Elbow    8.4  8.0          EMG   Side Muscle Nerve Root Ins Act Fibs Psw Amp Dur Poly Recrt Int Dennie Bible Comment  Right Abd Poll Brev Median C8-T1 Nml Nml Nml Nml Nml 0 Nml Nml   Right 1stDorInt Ulnar C8-T1 Nml Nml Nml Nml Nml 0 Nml Nml   Right PronatorTeres Median C6-7 Nml Nml Nml Nml Nml 0 Nml Nml   Right Biceps Musculocut C5-6 Nml Nml Nml Nml Nml 0 Nml Nml   Right Deltoid Axillary C5-6 Nml Nml Nml Nml Nml 0 Nml Nml     Nerve Conduction Studies Anti Sensory Left/Right Comparison   Stim Site L Lat (ms) R Lat (ms) L-R Lat (ms) L Amp (V) R Amp (V) L-R Amp (%) Site1 Site2 L Vel (m/s) R Vel (m/s) L-R Vel (m/s)  Median Anti Sensory (2nd Digit)  31.1C  Wrist 3.4   39.8   Wrist 2nd Digit 41    Median Acr Palm Anti Sensory (2nd Digit)  30.9C  Wrist  3.4   37.8  Wrist Palm     Palm  1.8   40.1        Radial Anti Sensory (Base 1st Digit)  32.8C  Wrist 1.9 1.9 0.0 34.1 23.3 31.7 Wrist Base 1st Digit     Ulnar Anti Sensory (5th Digit)  31.5C  Wrist 3.0 3.1 0.1 28.0 32.5 13.8 Wrist 5th Digit 47 45 2   Motor Left/Right Comparison   Stim Site L Lat (ms) R Lat (ms) L-R Lat (ms) L Amp (mV) R Amp (mV) L-R Amp (%) Site1 Site2 L Vel (m/s) R Vel (m/s) L-R Vel (m/s)  Median Motor (Abd Poll Brev)  32.8C  Wrist 3.2 3.3 0.1 13.7 11.6 15.3 Elbow Wrist 57 57  0  Elbow 7.9  7.9 0.0 11.2 5.4 51.8       Ulnar Motor (Abd Dig Min)  32.1C  Wrist 2.7 2.6 0.1 6.8 8.6 20.9 B Elbow Wrist 57 63 6  B Elbow 7.3 6.9 0.4 4.2 7.2 41.7 A Elbow B Elbow 81 73 8  A Elbow 8.6 8.4 0.2 6.6 8.0 17.5          Clinical History: No specialty comments available.  She reports that she quit smoking about 22 months ago. Her smoking use included Cigarettes. She has a 7.00 pack-year smoking history. She has never used smokeless tobacco.   Recent Labs  12/19/15 1347  HGBA1C 4.9    Objective:  VS:  HT:    WT:   BMI:     BP:   HR: bpm  TEMP: ( )  RESP:  Physical Exam  Musculoskeletal:  Inspection reveals no atrophy of the bilateral APB or FDI or hand intrinsics. There is no swelling, color changes, allodynia or dystrophic changes. There is 5 out of 5 strength in the bilateral wrist extension, finger abduction and long finger flexion. There is intact sensation to light touch in all dermatomal and peripheral nerve distributions. There is a negative Phalen's test bilaterally. There is a negative Hoffmann's test bilaterally.    Ortho Exam Imaging: No results found.  Past Medical/Family/Surgical/Social History: Medications & Allergies reviewed per EMR Patient Active Problem List   Diagnosis Date Noted  . Rheumatoid factor positive 08/01/2016  . High risk medication use 01/16/2015  . G6PD deficiency (HCC) 10/31/2014  . Dyspnea 08/23/2014  . ILD (interstitial lung disease) (HCC) 08/23/2014  . Lupus 08/23/2014  . Other organ or system involvement in systemic lupus erythematosus (HCC) 08/23/2014   Past Medical History:  Diagnosis Date  . Anemia   . G6PD deficiency (HCC)   . ILD (interstitial lung disease) (HCC)   . Lupus   . Rheumatoid arthritis (HCC)    Family History  Problem Relation Age of Onset  . Sarcoidosis Mother   . Lupus Sister    Past Surgical History:  Procedure Laterality Date  . APPENDECTOMY  1980  . CARDIAC CATHETERIZATION N/A 05/10/2015   Procedure:  Right Heart Cath;  Surgeon: Laurey Morale, MD;  Location[facility] INVASIVE CV LAB;  Service: Cardiovascular;  Laterality: N/A;  . CESAREAN SECTION  '95, '02, '07   X 3  . CHOLECYSTECTOMY  2010  . LAPAROSCOPIC HYSTERECTOMY  10/2015   have ovaries  . TUBAL LIGATION     Social History   Occupational History  . [employer]    12/19/15 applying for SS   Social History Main Topics  . Smoking status: Former Smoker    Packs/day: 0.50    Years: 14.00    Types: Cigarettes    Quit date: 09/25/2014  . Smokeless tobacco: Never Used  . Alcohol use No  . Drug use: No  . Sexual activity: Not on file

## 2016-08-15 NOTE — Procedures (Signed)
Electrodiagnostic results:  All nerve conduction studies (as indicated in the following tables) were within normal limits.  All left vs. right side differences were within normal limits.    The muscle scoring table definition stored in the current test does not match the sentence generator setup.  The sentence could not be generated.  Impression: NORMAL electrodiagnostic study of both upper limbs.  There is no electrodiagnostic evidence of nerve entrapment, cervical radiculopathy.  This electrodiagnostic study cannot rule out small fiber polyneuropathy and dysesthesias from central pain sensitization syndromes such as fibromyalgia.  Recommendations: 1.  Follow-up with referring physician. 2.  Continue current management of symptoms.   Nerve Conduction Studies Anti Sensory Summary Table   Stim Site NR Peak (ms) Norm Peak (ms) P-T Amp (V) Norm P-T Amp Site1 Site2 Delta-P (ms) Dist (cm) Vel (m/s) Norm Vel (m/s)  Left Median Anti Sensory (2nd Digit)  31.1C  Wrist    3.4 <3.6 39.8 >10 Wrist 2nd Digit 3.4 14.0 41 >39  Right Median Acr Palm Anti Sensory (2nd Digit)  30.9C  Wrist    3.4 <3.6 37.8 >10 Wrist Palm 1.6 0.0    Palm    1.8 <2.0 40.1         Left Radial Anti Sensory (Base 1st Digit)  32.8C  Wrist    1.9 <3.1 34.1  Wrist Base 1st Digit 1.9 0.0    Right Radial Anti Sensory (Base 1st Digit)  31.5C  Wrist    1.9 <3.1 23.3  Wrist Base 1st Digit 1.9 0.0    Left Ulnar Anti Sensory (5th Digit)  31.5C  Wrist    3.0 <3.7 28.0 >15.0 Wrist 5th Digit 3.0 14.0 47 >38  Right Ulnar Anti Sensory (5th Digit)  31C  Wrist    3.1 <3.7 32.5 >15.0 Wrist 5th Digit 3.1 14.0 45 >38   Motor Summary Table   Stim Site NR Onset (ms) Norm Onset (ms) O-P Amp (mV) Norm O-P Amp Site1 Site2 Delta-0 (ms) Dist (cm) Vel (m/s) Norm Vel (m/s)  Left Median Motor (Abd Poll Brev)  32.8C  Wrist    3.2 <4.2 13.7 >5 Elbow Wrist 4.7 27.0 57 >50  Elbow    7.9  11.2         Right Median Motor (Abd Poll Brev)   31.3C  Wrist    3.3 <4.2 11.6 >5 Elbow Wrist 4.6 26.0 57 >50  Elbow    7.9  5.4         Left Ulnar Motor (Abd Dig Min)  32.1C  Wrist    2.7 <4.2 6.8 >3 B Elbow Wrist 4.6 26.0 57 >53  B Elbow    7.3  4.2  A Elbow B Elbow 1.3 10.5 81 >53  A Elbow    8.6  6.6         Right Ulnar Motor (Abd Dig Min)  31C  Wrist    2.6 <4.2 8.6 >3 B Elbow Wrist 4.3 27.0 63 >53  B Elbow    6.9  7.2  A Elbow B Elbow 1.5 11.0 73 >53  A Elbow    8.4  8.0          EMG   Side Muscle Nerve Root Ins Act Fibs Psw Amp Dur Poly Recrt Int Dennie Bible Comment  Right Abd Poll Brev Median C8-T1 Nml Nml Nml Nml Nml 0 Nml Nml   Right 1stDorInt Ulnar C8-T1 Nml Nml Nml Nml Nml 0 Nml Nml   Right PronatorTeres Median C6-7  Nml Nml Nml Nml Nml 0 Nml Nml   Right Biceps Musculocut C5-6 Nml Nml Nml Nml Nml 0 Nml Nml   Right Deltoid Axillary C5-6 Nml Nml Nml Nml Nml 0 Nml Nml     Nerve Conduction Studies Anti Sensory Left/Right Comparison   Stim Site L Lat (ms) R Lat (ms) L-R Lat (ms) L Amp (V) R Amp (V) L-R Amp (%) Site1 Site2 L Vel (m/s) R Vel (m/s) L-R Vel (m/s)  Median Anti Sensory (2nd Digit)  31.1C  Wrist 3.4   39.8   Wrist 2nd Digit 41    Median Acr Palm Anti Sensory (2nd Digit)  30.9C  Wrist  3.4   37.8  Wrist Palm     Palm  1.8   40.1        Radial Anti Sensory (Base 1st Digit)  32.8C  Wrist 1.9 1.9 0.0 34.1 23.3 31.7 Wrist Base 1st Digit     Ulnar Anti Sensory (5th Digit)  31.5C  Wrist 3.0 3.1 0.1 28.0 32.5 13.8 Wrist 5th Digit 47 45 2   Motor Left/Right Comparison   Stim Site L Lat (ms) R Lat (ms) L-R Lat (ms) L Amp (mV) R Amp (mV) L-R Amp (%) Site1 Site2 L Vel (m/s) R Vel (m/s) L-R Vel (m/s)  Median Motor (Abd Poll Brev)  32.8C  Wrist 3.2 3.3 0.1 13.7 11.6 15.3 Elbow Wrist 57 57 0  Elbow 7.9 7.9 0.0 11.2 5.4 51.8       Ulnar Motor (Abd Dig Min)  32.1C  Wrist 2.7 2.6 0.1 6.8 8.6 20.9 B Elbow Wrist 57 63 6  B Elbow 7.3 6.9 0.4 4.2 7.2 41.7 A Elbow B Elbow 81 73 8  A Elbow 8.6 8.4 0.2 6.6 8.0 17.5

## 2016-08-20 ENCOUNTER — Telehealth: Payer: Self-pay | Admitting: *Deleted

## 2016-08-20 NOTE — Telephone Encounter (Signed)
-----   Message from [place], [place] sent at 08/13/2016  6:34 PM EST ----- Regarding: FW: ?failing cellcept  I DISCUSSED W/ DR. Algis Downs SHE ADVISED THE FOLLOWING:==>  Cellcept is proper medication for SLE. For the increased mouth sores, ok to give   Rx for magic mouth wash  If this does not solve the problem, we can discuss BENLYSTA    ----- Message ----- From: [name], MD Sent: 08/13/2016   6:15 PM To: Tawni Pummel, PA-C Subject: RE: ?failing cellcept                          Please, consider Rx for magic mouth wash.   SD ----- Message ----- From: Tawni Pummel, PA-C Sent: 08/05/2016  10:11 AM To: [name], MD Subject: ?failing cellcept                              Patient has history of lupus. She's currently on CellCept for her lupus as well as infectious to lung disease. She is also taking daily prednisone 3 mg.  She states that she is having more oral ulcers, New rashes to her face as well as her chest. I wonder if she is failing her CellCept. She additionally has some joint pain in her MCP joints bilaterally but no synovitis or synovial thickening.  She is going to start pulmonary rehabilitation because of some shortness of breath. I've asked her to come back in 3 months from her 08/05/2016 visit so we can discuss what treatment options might be a good idea and see how she did with her pulmonary rehabilitation.

## 2016-08-20 NOTE — Telephone Encounter (Signed)
Patient advised of recommendations. Patient states she already has a prescription for the magic mouthwash with a refill. Patient has a follow up visit on 09/09/16.

## 2016-09-09 ENCOUNTER — Encounter: Payer: Self-pay | Admitting: Rheumatology

## 2016-09-09 ENCOUNTER — Ambulatory Visit (INDEPENDENT_AMBULATORY_CARE_PROVIDER_SITE_OTHER): Payer: 59 | Admitting: Rheumatology

## 2016-09-09 VITALS — BP 138/84 | HR 62 | Resp 18 | Ht 71.0 in | Wt 229.0 lb

## 2016-09-09 DIAGNOSIS — M3219 Other organ or system involvement in systemic lupus erythematosus: Secondary | ICD-10-CM

## 2016-09-09 DIAGNOSIS — Z8739 Personal history of other diseases of the musculoskeletal system and connective tissue: Secondary | ICD-10-CM | POA: Diagnosis not present

## 2016-09-09 DIAGNOSIS — D550 Anemia due to glucose-6-phosphate dehydrogenase [G6PD] deficiency: Secondary | ICD-10-CM

## 2016-09-09 DIAGNOSIS — R768 Other specified abnormal immunological findings in serum: Secondary | ICD-10-CM | POA: Diagnosis not present

## 2016-09-09 DIAGNOSIS — J849 Interstitial pulmonary disease, unspecified: Secondary | ICD-10-CM | POA: Diagnosis not present

## 2016-09-09 DIAGNOSIS — M3213 Lung involvement in systemic lupus erythematosus: Secondary | ICD-10-CM

## 2016-09-09 DIAGNOSIS — M79641 Pain in right hand: Secondary | ICD-10-CM | POA: Diagnosis not present

## 2016-09-09 DIAGNOSIS — M79642 Pain in left hand: Secondary | ICD-10-CM

## 2016-09-09 DIAGNOSIS — Z79899 Other long term (current) drug therapy: Secondary | ICD-10-CM

## 2016-09-09 DIAGNOSIS — D75A Glucose-6-phosphate dehydrogenase (G6PD) deficiency without anemia: Secondary | ICD-10-CM

## 2016-09-09 LAB — COMPLETE METABOLIC PANEL WITH GFR
ALBUMIN: 4 g/dL (ref 3.6–5.1)
ALT: 11 U/L (ref 6–29)
AST: 16 U/L (ref 10–30)
Alkaline Phosphatase: 65 U/L (ref 33–115)
BUN: 9 mg/dL (ref 7–25)
CALCIUM: 9 mg/dL (ref 8.6–10.2)
CO2: 23 mmol/L (ref 20–31)
CREATININE: 0.68 mg/dL (ref 0.50–1.10)
Chloride: 105 mmol/L (ref 98–110)
Glucose, Bld: 88 mg/dL (ref 65–99)
Potassium: 4.1 mmol/L (ref 3.5–5.3)
Sodium: 137 mmol/L (ref 135–146)
Total Bilirubin: 0.3 mg/dL (ref 0.2–1.2)
Total Protein: 7.6 g/dL (ref 6.1–8.1)

## 2016-09-09 LAB — CBC WITH DIFFERENTIAL/PLATELET
BASOS ABS: 0 {cells}/uL (ref 0–200)
Basophils Relative: 0 %
EOS ABS: 40 {cells}/uL (ref 15–500)
Eosinophils Relative: 1 %
HEMATOCRIT: 36.3 % (ref 35.0–45.0)
HEMOGLOBIN: 11.7 g/dL (ref 11.7–15.5)
LYMPHS ABS: 1400 {cells}/uL (ref 850–3900)
Lymphocytes Relative: 35 %
MCH: 29.5 pg (ref 27.0–33.0)
MCHC: 32.2 g/dL (ref 32.0–36.0)
MCV: 91.4 fL (ref 80.0–100.0)
MONO ABS: 280 {cells}/uL (ref 200–950)
MONOS PCT: 7 %
MPV: 11.6 fL (ref 7.5–12.5)
NEUTROS ABS: 2280 {cells}/uL (ref 1500–7800)
Neutrophils Relative %: 57 %
Platelets: 232 10*3/uL (ref 140–400)
RBC: 3.97 MIL/uL (ref 3.80–5.10)
RDW: 13.6 % (ref 11.0–15.0)
WBC: 4 10*3/uL (ref 3.8–10.8)

## 2016-09-09 MED ORDER — PREDNISONE 1 MG PO TABS: 3.0000 mg | ORAL_TABLET | Freq: Every day | ORAL | 1 refills | Status: AC

## 2016-09-09 MED ORDER — MYCOPHENOLATE MOFETIL 500 MG PO TABS: 1000.0000 mg | ORAL_TABLET | Freq: Two times a day (BID) | ORAL | 0 refills | Status: AC

## 2016-09-09 NOTE — Progress Notes (Signed)
Office Visit Note  Patient: Shannon [name]             Date of Birth: 08-Dec-1970           MRN: [id number]             PCP: Sheppard Evens Referring: Janine Limbo, PA-C Visit Date: 09/09/2016 Occupation: '@GUAROCC' @    Subjective:  Follow-up   History of Present Illness: Shannon [name] is a 46 y.o. female   Last seen for rate 13 2018. Patient states that today's appointment was made by Dr. Pollie Friar office to follow-up on her carpal tunnel results. She was having numbness and tingling to bilateral wrist and hands with right worse than left. The summary of the report as shown below     Impression: From Dr. Rolin Barry (08/15/2016)   NORMAL electrodiagnostic study of both upper limbs. There is no   electrodiagnostic evidence of nerve entrapment, cervical   radiculopathy.  Patient has an appointment with Dr. Lonni Fix to discuss her memory issues in the next few weeks. I've advised the patient to discuss her numbness and tingling to both of her hands and wrists with him.   Her main complaint today is that she has bilateral knee joint pain. We applied for Visco at the last visit and we will try to update the status of her Visco application so that we could quickly get this patient started on Visco supplementation.  She has a same amount of oral ulcers as before but the Magic mouthwash is really given her good relief. She is able to eat better and more comfortably as a result.  At the last visit, she was complaining of some shortness of breath. Since then, she has started pulmonary rehabilitation and she is done really well with her shortness of breath.  She continues to get CellCept from her pulmonologist. She does not have a follow-up appointment with him and she is almost out of the medication and is requesting [place] to do a refill until she can see him. I've advised her that this one time I can refill her CellCept.  She is also on prednisone 3 mg daily.   Activities of Daily Living:    Patient reports morning stiffness for 15 minutes.   Patient Reports nocturnal pain.  Difficulty dressing/grooming: Denies Difficulty climbing stairs: Reports Difficulty getting out of chair: Reports Difficulty using hands for taps, buttons, cutlery, and/or writing: Reports   Review of Systems  Constitutional: Negative for fatigue.  HENT: Negative for mouth sores and mouth dryness.   Eyes: Negative for dryness.  Respiratory: Negative for shortness of breath.   Gastrointestinal: Negative for constipation and diarrhea.  Musculoskeletal: Negative for myalgias and myalgias.  Skin: Negative for sensitivity to sunlight.  Psychiatric/Behavioral: Negative for decreased concentration and sleep disturbance.    PMFS History:  Patient Active Problem List   Diagnosis Date Noted  . Other organ or system involvement in systemic lupus erythematosus ([place]) 09/09/2016  . Pain in right hand 09/09/2016  . Pain in left hand 09/09/2016  . History of osteoarthritis 09/09/2016  . Rheumatoid factor positive 08/01/2016  . High risk medication use 01/16/2015  . G6PD deficiency ([place]) 10/31/2014  . Dyspnea 08/23/2014  . ILD (interstitial lung disease) ([place]) 08/23/2014  . Lupus 08/23/2014  . Systemic lupus erythematosus ([place]) 08/23/2014    Past Medical History:  Diagnosis Date  . Anemia   . G6PD deficiency ([place])   . ILD (interstitial lung disease) ([place])   . Lupus   .  Rheumatoid arthritis ([place])     Family History  Problem Relation Age of Onset  . Sarcoidosis Mother   . Lupus Sister    Past Surgical History:  Procedure Laterality Date  . APPENDECTOMY  1980  . CARDIAC CATHETERIZATION N/A 05/10/2015   Procedure: Right Heart Cath;  Surgeon: Larey Dresser, MD;  Location: [facility];  Service: Cardiovascular;  Laterality: N/A;  . CESAREAN SECTION  '95, '02, '07   X 3  . CHOLECYSTECTOMY  2010  . LAPAROSCOPIC HYSTERECTOMY  10/2015   have ovaries  . TUBAL LIGATION     Social History    Social History Narrative   Married, lives with husband, children   Caffeine- coffee  1 daily     Objective: Vital Signs: BP 138/84   Pulse 62   Resp 18   Ht '5\' 11"'  (1.803 m)   Wt 229 lb (103.9 kg)   LMP 04/28/2015   BMI 31.94 kg/m    Physical Exam  Constitutional: She is oriented to person, place, and time. She appears well-developed and well-nourished.  HENT:  Head: Normocephalic and atraumatic.  Eyes: EOM are normal. Pupils are equal, round, and reactive to light.  Cardiovascular: Normal rate, regular rhythm and normal heart sounds.  Exam reveals no gallop and no friction rub.   No murmur heard. Pulmonary/Chest: Effort normal and breath sounds normal. She has no wheezes. She has no rales.  Abdominal: Soft. Bowel sounds are normal. She exhibits no distension. There is no tenderness. There is no guarding. No hernia.  Musculoskeletal: Normal range of motion. She exhibits no edema, tenderness or deformity.  Lymphadenopathy:    She has no cervical adenopathy.  Neurological: She is alert and oriented to person, place, and time. Coordination normal.  Skin: Skin is warm and dry. Capillary refill takes less than 2 seconds. No rash noted.  Psychiatric: She has a normal mood and affect. Her behavior is normal.  Nursing note and vitals reviewed.    Musculoskeletal Exam:  Full range of motion of all joints Grip strength is equal and strong bilaterally Fiber myalgia tender points are absent  CDAI Exam: CDAI Homunculus Exam:   Joint Counts:  CDAI Tender Joint count: 0 CDAI Swollen Joint count: 0  Global Assessments:  Patient Global Assessment: 6 Provider Global Assessment: 6  CDAI Calculated Score: 12    Investigation: Findings:  January 2016:  CBC showed hemoglobin of 10.1.  Comprehensive metabolic panel was normal.  UA showed esterase  08/21/2014  We did obtain an x-ray of bilateral hands, 2 views today, which showed bilateral PIP narrowing.  No MCP joint narrowing  was noted.  No erosive changes were noted.  Bilateral elbow joint x-rays showed some joint space narrowing without any erosive changes.  Bilateral knee joint x-rays, 2 views, showed moderate medial compartment narrowing, bilateral moderate patellofemoral narrowing without any chondrocalcinosis, consistent with moderate osteoarthritis.   March 2016:  CBC showed WBC count of 3.  Hemoglobin 9.7.  Platelets were normal.  Comprehensive metabolic panel was normal.  Sed rate was elevated at 36.  ACE level was negative.  UA was negative.  CK and TSH were normal.  Rheumatoid factor was 65.  CCP was negative. ANA was 1:1280 nuclear speckled pattern.  She has positive Smith and positive RNP antibody.  Beta-2 anticardiolipin and lupus anticoagulant was negative.  Hepatitis panel, HIV and SPEP were normal.  Immunoglobulin G elevated.     TB Gold was negative.  Office Visit on 05/27/2016  Component Date Value Ref Range Status  . WBC 05/27/2016 3.1* 3.8 - 10.8 K/uL Final  . RBC 05/27/2016 3.90  3.80 - 5.10 MIL/uL Final  . Hemoglobin 05/27/2016 11.4* 11.7 - 15.5 g/dL Final  . HCT 05/27/2016 36.0  35.0 - 45.0 % Final  . MCV 05/27/2016 92.3  80.0 - 100.0 fL Final  . MCH 05/27/2016 29.2  27.0 - 33.0 pg Final  . MCHC 05/27/2016 31.7* 32.0 - 36.0 g/dL Final  . RDW 05/27/2016 13.7  11.0 - 15.0 % Final  . Platelets 05/27/2016 239  140 - 400 K/uL Final  . MPV 05/27/2016 11.2  7.5 - 12.5 fL Final  . Neutro Abs 05/27/2016 1798  1,500 - 7,800 cells/uL Final  . Lymphs Abs 05/27/2016 1054  850 - 3,900 cells/uL Final  . Monocytes Absolute 05/27/2016 186* 200 - 950 cells/uL Final  . Eosinophils Absolute 05/27/2016 62  15 - 500 cells/uL Final  . Basophils Absolute 05/27/2016 0  0 - 200 cells/uL Final  . Neutrophils Relative % 05/27/2016 58  % Final  . Lymphocytes Relative 05/27/2016 34  % Final  . Monocytes Relative 05/27/2016 6  % Final  . Eosinophils Relative 05/27/2016 2  % Final  . Basophils Relative 05/27/2016 0  %  Final  . Smear Review 05/27/2016 Criteria for review not met   Final  . Sodium 05/27/2016 138  135 - 146 mmol/L Final  . Potassium 05/27/2016 3.8  3.5 - 5.3 mmol/L Final  . Chloride 05/27/2016 104  98 - 110 mmol/L Final  . CO2 05/27/2016 25  20 - 31 mmol/L Final  . Glucose, Bld 05/27/2016 78  65 - 99 mg/dL Final  . BUN 05/27/2016 11  7 - 25 mg/dL Final  . Creat 05/27/2016 0.71  0.50 - 1.10 mg/dL Final  . Total Bilirubin 05/27/2016 0.3  0.2 - 1.2 mg/dL Final  . Alkaline Phosphatase 05/27/2016 64  33 - 115 U/L Final  . AST 05/27/2016 17  10 - 30 U/L Final  . ALT 05/27/2016 13  6 - 29 U/L Final  . Total Protein 05/27/2016 7.8  6.1 - 8.1 g/dL Final  . Albumin 05/27/2016 4.1  3.6 - 5.1 g/dL Final  . Calcium 05/27/2016 9.0  8.6 - 10.2 mg/dL Final  . GFR, Est African American 05/27/2016 >89  >=60 mL/min Final  . GFR, Est Non African American 05/27/2016 >89  >=60 mL/min Final  . Vit D, 25-Hydroxy 05/27/2016 48  30 - 100 ng/mL Final   Comment: Vitamin D Status           25-OH Vitamin D        Deficiency                <20 ng/mL        Insufficiency         20 - 29 ng/mL        Optimal             > or = 30 ng/mL   For 25-OH Vitamin D testing on patients on D2-supplementation and patients for whom quantitation of D2 and D3 fractions is required, the [facility] 25-OH VIT D, (D2,D3), LC/MS/MS is recommended: order code [phone] (patients > 2 yrs).   Clinical Support on 05/06/2016  Component Date Value Ref Range Status  . FVC-Pre 05/06/2016 2.63  L Final  . FVC-%Pred-Pre 05/06/2016 67  % Final  . FEV1-Pre 05/06/2016 2.10  L Final  . FEV1-%Pred-Pre 05/06/2016 65  %  Final  . FEV6-Pre 05/06/2016 2.62  L Final  . FEV6-%Pred-Pre 05/06/2016 68  % Final  . Pre FEV1/FVC ratio 05/06/2016 80  % Final  . FEV1FVC-%Pred-Pre 05/06/2016 95  % Final  . Pre FEV6/FVC Ratio 05/06/2016 99  % Final  . FEV6FVC-%Pred-Pre 05/06/2016 101  % Final  . FEF 25-75 Pre 05/06/2016 2.06  L/sec Final  . FEF2575-%Pred-Pre  05/06/2016 63  % Final  . DLCO unc 05/06/2016 18.31  ml/min/mmHg Final  . DLCO unc % pred 05/06/2016 53  % Final  . DLCO cor 05/06/2016 19.41  ml/min/mmHg Final  . DLCO cor % pred 05/06/2016 56  % Final  . DL/VA 05/06/2016 5.23  ml/min/mmHg/L Final  . DL/VA % pred 05/06/2016 94  % Final  Lab on 04/15/2016  Component Date Value Ref Range Status  . WBC 04/15/2016 4.8  4.0 - 10.5 K/uL Final  . RBC 04/15/2016 3.68* 3.87 - 5.11 Mil/uL Final  . Platelets 04/15/2016 224.0  150.0 - 400.0 K/uL Final  . Hemoglobin 04/15/2016 11.0* 12.0 - 15.0 g/dL Final  . HCT 04/15/2016 33.1* 36.0 - 46.0 % Final  . MCV 04/15/2016 90.0  78.0 - 100.0 fl Final  . MCHC 04/15/2016 33.3  30.0 - 36.0 g/dL Final  . RDW 04/15/2016 13.2  11.5 - 15.5 % Final  . Total Bilirubin 04/15/2016 0.3  0.2 - 1.2 mg/dL Final  . Bilirubin, Direct 04/15/2016 0.1  0.0 - 0.3 mg/dL Final  . Alkaline Phosphatase 04/15/2016 66  39 - 117 U/L Final  . AST 04/15/2016 13  0 - 37 U/L Final  . ALT 04/15/2016 9  0 - 35 U/L Final  . Total Protein 04/15/2016 8.0  6.0 - 8.3 g/dL Final  . Albumin 04/15/2016 3.9  3.5 - 5.2 g/dL Final      Imaging: No results found.  Speciality Comments: No specialty comments available.    Procedures:  No procedures performed Allergies: Asa [aspirin]; Penicillins; and Sulfa antibiotics   Assessment / Plan:     Visit Diagnoses: Other systemic lupus erythematosus with lung involvement (HCC) - Rheumatoid factor was 65.  CCP was negative. ANA was 1:1280 nuclear speckled pattern.  She has positive Smith and positive RNP antibody - Plan: CBC with Differential/Platelet, COMPLETE METABOLIC PANEL WITH GFR, Urinalysis, Routine w reflex microscopic  High risk medication use - Plan: CBC with Differential/Platelet, COMPLETE METABOLIC PANEL WITH GFR, Urinalysis, Routine w reflex microscopic  Other organ or system involvement in systemic lupus erythematosus (HCC)  ILD (interstitial lung disease) ([place]) - Plan:  mycophenolate (CELLCEPT) 500 MG tablet  G6PD deficiency ([place]) - Plan: CBC with Differential/Platelet  Rheumatoid factor positive  Pain in right hand  Pain in left hand  History of osteoarthritis - Knees   Plan: #1: Systemic lupus erythematosus. At the last visit, we were wondering if patient's CellCept was adequate for her lupus since she was having ongoing oral ulcers ongoing rash to her chest and the mala rash and ongoing arthralgia. I discussed CellCept with Dr. Estanislado Pandy and she feels that CellCept is adequate medication for the patient. For the oral ulcers, we offered the patient Magic mouthwash.  Patient states that the Magic mouthwash is very helpful that she is able to eat better as a result.  Patient's joints are still achy. No change from the last visit.  We may consider been listed for the patient in the future but at this time, we feel that CellCept is appropriate medication for the patient  #2: Patient has  started pulmonary rehabilitation for her shortness of breath. She is doing better with the shortness of breath as a result of starting the rehabilitation.  #3: She has ongoing pain to bilateral knees with the left one being worse. We had applied for Visco supplementation but we haven't heard anything regarding that. I will resubmit another request for Visco supplementation for the patient so she can get benefit to her knees.  #4: She had some low back pain and the Robaxin that we wrote for her has helped her. She will use the Robaxin on a when necessary basis. Patient is agreeable.  #5: We referred the patient to Dr. Ernestina Patches for evaluation for carpal tunnel. According to the patient, her carpal tunnel test was negative. We will refer her to a neurologist for further workup since she still has ongoing numbness and tingling to her hands (right worse than left).  #6: High risk prescription. Taking CellCept 1000 mg in the morning and 1000 mg in the  evening Prednisone 3 mg daily (Patient is allergic to sulfa and so is not taking Bactrim. She also has a G6PD deficiency.)  #7: Patient's numbness and tingling in her bilateral hands with right worse than left should be evaluated by neurologist. Patient has a history of memory loss for which she seeing the neurologist Dr. Lonni Fix sometime next month. I've advised the patient to discuss with Dr. Joylene Grapes her numbness and tingling in her bilateral hands with right worse than left and also take the Dr. Romona Curls carpal tunnel report with her so that he knows that it is not related to carpal tunnel. Patient understands and is agreeable  Orders: Orders Placed This Encounter  Procedures  . CBC with Differential/Platelet  . COMPLETE METABOLIC PANEL WITH GFR  . Urinalysis, Routine w reflex microscopic   Meds ordered this encounter  Medications  . predniSONE (DELTASONE) 1 MG tablet    Sig: Take 3 tablets (3 mg total) by mouth daily with breakfast.    Dispense:  270 tablet    Refill:  1    Order Specific Question:   Supervising Provider    Answer:   Bo Merino [[id number]]  . mycophenolate (CELLCEPT) 500 MG tablet    Sig: Take 2 tablets (1,000 mg total) by mouth 2 (two) times daily.    Dispense:  360 tablet    Refill:  0    Order Specific Question:   Supervising Provider    Answer:   Bo Merino [phone]    Face-to-face time spent with patient was 30 minutes. 50% of time was spent in counseling and coordination of care.  Follow-Up Instructions: Return keep june appt., for SLE, CellCept,Pred65m,not on Bactrim, bil hands numb & tinglin (penn[place] appt april).   NEliezer Lofts PA-C  Note - This record has been created using [employer]  Chart creation errors have been sought, but may not always  have been located. Such creation errors do not reflect on  the standard of medical care.

## 2016-09-10 LAB — URINALYSIS, ROUTINE W REFLEX MICROSCOPIC
BILIRUBIN URINE: NEGATIVE
Glucose, UA: NEGATIVE
HGB URINE DIPSTICK: NEGATIVE
Ketones, ur: NEGATIVE
Leukocytes, UA: NEGATIVE
Nitrite: NEGATIVE
PH: 6.5 (ref 5.0–8.0)
Protein, ur: NEGATIVE
SPECIFIC GRAVITY, URINE: 1.018 (ref 1.001–1.035)

## 2016-09-10 NOTE — Progress Notes (Signed)
*  and tell patient

## 2016-10-14 ENCOUNTER — Ambulatory Visit: Payer: 59 | Admitting: Diagnostic Neuroimaging

## 2016-11-04 ENCOUNTER — Ambulatory Visit: Payer: 59 | Admitting: Rheumatology

## 2016-11-11 ENCOUNTER — Telehealth (INDEPENDENT_AMBULATORY_CARE_PROVIDER_SITE_OTHER): Payer: Self-pay | Admitting: Radiology

## 2016-11-11 NOTE — Telephone Encounter (Signed)
Message  Due: 1 week ago  Received: 1 week ago  Message Contents  Audrie Lia, RT  Moksh Loomer, RT        new   Previous Messages    ----- Message -----  From: Tawni Pummel, PA-C  Sent: 09/09/2016 10:42 AM  To: Audrie Lia, RT      If her application has not been started, please apply for Hyalgan bilateral knees 5.  Euflexxa Orthovisc are also acceptable   HPI ===>  Patient needs Visco supplementation to both knees  She has failed NSAID's, weight loss, failed cortisone last visit.  She would benefit from Visco.  We Tyrease Vandeberg have already applied for Visco and you Kimaria Struthers be in the process of having it approved.  If it's approved patient would like to start on the injection as soon as possible       Patient has [employer] ins, please apply for Hyalgan, thanks.

## 2016-11-21 DIAGNOSIS — M170 Bilateral primary osteoarthritis of knee: Secondary | ICD-10-CM | POA: Insufficient documentation

## 2016-11-21 DIAGNOSIS — M24522 Contracture, left elbow: Secondary | ICD-10-CM | POA: Insufficient documentation

## 2016-11-21 NOTE — Progress Notes (Signed)
Office Visit Note  Patient: Shannon [name]             Date of Birth: 09-10-1970           MRN: [id number]             PCP: Eunice Blase, PA-C Referring: Eunice Blase, PA-C Visit Date: 11/27/2016 Occupation: @GUAROCC @    Subjective:  fatigue.   History of Present Illness: Shannon [name] is a 46 y.o. female with history of systemic lupus or dermatosis. She believes that the CellCept is not working as well for her. She states she's been having increased fatigue, oral ulcers, Raynaud's phenomena . She has intermittent swelling in her hands, knees and her feet. She just finished pulmonary rehabilitation and her shortness of breath is improved.  Activities of Daily Living:  Patient reports morning stiffness for 15 minutes.   Patient Reports nocturnal pain.  Difficulty dressing/grooming: Denies Difficulty climbing stairs: Reports Difficulty getting out of chair: Reports Difficulty using hands for taps, buttons, cutlery, and/or writing: Denies   Review of Systems  Constitutional: Positive for fatigue. Negative for night sweats, weight gain, weight loss and weakness.  HENT: Positive for mouth sores and mouth dryness. Negative for trouble swallowing, trouble swallowing and nose dryness.   Eyes: Negative for pain, redness, visual disturbance and dryness.  Respiratory: Negative for cough, shortness of breath and difficulty breathing.   Cardiovascular: Negative for chest pain, palpitations, hypertension, irregular heartbeat and swelling in legs/feet.  Gastrointestinal: Negative for blood in stool, constipation and diarrhea.  Endocrine: Negative for increased urination.  Genitourinary: Negative for vaginal dryness.  Musculoskeletal: Positive for arthralgias, joint pain and morning stiffness. Negative for joint swelling, myalgias, muscle weakness, muscle tenderness and myalgias.  Skin: Positive for color change and rash. Negative for hair loss, skin tightness, ulcers and sensitivity to  sunlight.  Allergic/Immunologic: Negative for susceptible to infections.  Neurological: Negative for dizziness, memory loss and night sweats.  Hematological: Negative for swollen glands.  Psychiatric/Behavioral: Positive for depressed mood and sleep disturbance. The patient is not nervous/anxious.     PMFS History:  Patient Active Problem List   Diagnosis Date Noted  . Contracture of left elbow 11/21/2016  . Primary osteoarthritis of both knees 11/21/2016  . Pain in right hand 09/09/2016  . Pain in left hand 09/09/2016  . History of osteoarthritis 09/09/2016  . Rheumatoid factor positive 08/01/2016  . High risk medication use 01/16/2015  . G6PD deficiency (HCC) 10/31/2014  . Dyspnea 08/23/2014  . ILD (interstitial lung disease) (HCC) 08/23/2014  . Other organ or system involvement in systemic lupus erythematosus (HCC) 08/23/2014    Past Medical History:  Diagnosis Date  . Anemia   . G6PD deficiency (HCC)   . ILD (interstitial lung disease) (HCC)   . Lupus   . Rheumatoid arthritis (HCC)     Family History  Problem Relation Age of Onset  . Sarcoidosis Mother   . Lupus Sister    Past Surgical History:  Procedure Laterality Date  . APPENDECTOMY  1980  . CARDIAC CATHETERIZATION N/A 05/10/2015   Procedure: Right Heart Cath;  Surgeon: 05/12/2015, MD;  Location: [facility] INVASIVE CV LAB;  Service: Cardiovascular;  Laterality: N/A;  . CESAREAN SECTION  '95, '02, '07   X 3  . CHOLECYSTECTOMY  2010  . LAPAROSCOPIC HYSTERECTOMY  10/2015   have ovaries  . TUBAL LIGATION     Social History   Social History Narrative   Married, lives with husband, children  Caffeine- coffee  1 daily     Objective: Vital Signs: BP 116/75   Pulse 71   Resp 14   Ht 5\' 11"  (1.803 m)   Wt 229 lb (103.9 kg)   LMP 04/28/2015   BMI 31.94 kg/m    Physical Exam  Constitutional: She is oriented to person, place, and time. She appears well-developed and well-nourished.  HENT:  Head:  Normocephalic and atraumatic.  Eyes: Conjunctivae and EOM are normal.  Neck: Normal range of motion.  Cardiovascular: Normal rate, regular rhythm, normal heart sounds and intact distal pulses.   Pulmonary/Chest: Effort normal and breath sounds normal.  Abdominal: Soft. Bowel sounds are normal.  Lymphadenopathy:    She has no cervical adenopathy.  Neurological: She is alert and oriented to person, place, and time.  Skin: Skin is warm and dry. Capillary refill takes less than 2 seconds.  Psychiatric: She has a normal mood and affect. Her behavior is normal.  Nursing note and vitals reviewed.    Musculoskeletal Exam: C-spine and thoracic lumbar spine good range of motion. Shoulder joints elbow joints wrist joint MCPs PIPs DIPs with good range of motion. No synovitis was noted. Hip joints knee joints ankles MTPs PIPs DIPs are good range of motion with no synovitis. She is some crepitus with range of motion of bilateral knee joints.  CDAI Exam: No CDAI exam completed.    Investigation: Findings:  02/26/2016 X-rays today, three views show moderate medial compartment narrowing bilaterally.  Mild patellofemoral joint space narrowing.  No CPPD.     She has a history of arthritis, Raynaud phenomenon, oral ulcers, hair loss,   CBC Latest Ref Rng & Units 09/09/2016 05/27/2016 04/15/2016  WBC 3.8 - 10.8 K/uL 4.0 3.1(L) 4.8  Hemoglobin 11.7 - 15.5 g/dL 04/17/2016 11.4(L) 11.0(L)  Hematocrit 35.0 - 45.0 % 36.3 36.0 33.1(L)  Platelets 140 - 400 K/uL 232 239 224.0   CMP Latest Ref Rng & Units 09/09/2016 05/27/2016 04/15/2016  Glucose 65 - 99 mg/dL 88 78 -  BUN 7 - 25 mg/dL 9 11 -  Creatinine 04/17/2016 - 1.10 mg/dL 6.23 7.62 -  Sodium 8.31 - 146 mmol/L 137 138 -  Potassium 3.5 - 5.3 mmol/L 4.1 3.8 -  Chloride 98 - 110 mmol/L 105 104 -  CO2 20 - 31 mmol/L 23 25 -  Calcium 8.6 - 10.2 mg/dL 9.0 9.0 -  Total Protein 6.1 - 8.1 g/dL 7.6 7.8 8.0  Total Bilirubin 0.2 - 1.2 mg/dL 0.3 0.3 0.3  Alkaline Phos 33 - 115  U/L 65 64 66  AST 10 - 30 U/L 16 17 13   ALT 6 - 29 U/L 11 13 9      Imaging: No results found.  Speciality Comments: No specialty comments available.    Procedures:  No procedures performed Allergies: Asa [aspirin]; Penicillins; and Sulfa antibiotics   Assessment / Plan:     Visit Diagnoses: Systemic Lupus Erythematosus  - Positive ANA, Smith, RNP, rheumatoid factor, history of arthritis with knee joint effusion, oral ulcers, Raynauds, anemia, neutropenia, interstitial lung diseas -I do not see any synovitis on examination I do not see any oral ulcers. No malar rash was noted. I will obtain following labs to see disease activity process. Plan: Urinalysis, Routine w reflex microscopic, Sedimentation rate, C3 and C4, Anti-DNA antibody, double-stranded, Anti-Smith antibody, RNP Antibody  ILD (interstitial lung disease) (HCC) - Followed up by Dr. 517. She is on CellCept  High risk medication use - CellCept 1 g by mouth  twice a day, prednisone - Plan: CBC with Differential/Platelet, COMPLETE METABOLIC PANEL WITH GFR today and every 3 months  Rheumatoid factor positive: No synovitis   Primary osteoarthritis of both knees: She continues to have pain and discomfort in her knee joints. She states she has inadequate response to the cortisone injections. Her Visco supplement injections were not covered by her insurance.  G6PD deficiency (HCC)  Paresthesia of both hands  Other fatigue - Plan: VITAMIN D 25 Hydroxy (Vit-D Deficiency, Fractures)    Orders: Orders Placed This Encounter  Procedures  . CBC with Differential/Platelet  . COMPLETE METABOLIC PANEL WITH GFR  . Urinalysis, Routine w reflex microscopic  . Sedimentation rate  . C3 and C4  . Anti-DNA antibody, double-stranded  . VITAMIN D 25 Hydroxy (Vit-D Deficiency, Fractures)  . Anti-Smith antibody  . RNP Antibody   No orders of the defined types were placed in this encounter.   Face-to-face time spent with patient  was 30 minutes. 50% of time was spent in counseling and coordination of care.  Follow-Up Instructions: Return in about 4 months (around 03/29/2017) for Systemic lupus,ILD,OA.   [name], MD  Note - This record has been created using Animal nutritionist.  Chart creation errors have been sought, but may not always  have been located. Such creation errors do not reflect on  the standard of medical care.

## 2016-11-27 ENCOUNTER — Telehealth: Payer: Self-pay | Admitting: *Deleted

## 2016-11-27 ENCOUNTER — Ambulatory Visit (INDEPENDENT_AMBULATORY_CARE_PROVIDER_SITE_OTHER): Payer: 59 | Admitting: Rheumatology

## 2016-11-27 ENCOUNTER — Encounter: Payer: Self-pay | Admitting: Rheumatology

## 2016-11-27 VITALS — BP 116/75 | HR 71 | Resp 14 | Ht 71.0 in | Wt 229.0 lb

## 2016-11-27 DIAGNOSIS — J849 Interstitial pulmonary disease, unspecified: Secondary | ICD-10-CM

## 2016-11-27 DIAGNOSIS — R202 Paresthesia of skin: Secondary | ICD-10-CM

## 2016-11-27 DIAGNOSIS — R768 Other specified abnormal immunological findings in serum: Secondary | ICD-10-CM

## 2016-11-27 DIAGNOSIS — R5383 Other fatigue: Secondary | ICD-10-CM

## 2016-11-27 DIAGNOSIS — M24522 Contracture, left elbow: Secondary | ICD-10-CM

## 2016-11-27 DIAGNOSIS — Z79899 Other long term (current) drug therapy: Secondary | ICD-10-CM

## 2016-11-27 DIAGNOSIS — D550 Anemia due to glucose-6-phosphate dehydrogenase [G6PD] deficiency: Secondary | ICD-10-CM

## 2016-11-27 DIAGNOSIS — M170 Bilateral primary osteoarthritis of knee: Secondary | ICD-10-CM | POA: Diagnosis not present

## 2016-11-27 DIAGNOSIS — M3219 Other organ or system involvement in systemic lupus erythematosus: Secondary | ICD-10-CM

## 2016-11-27 DIAGNOSIS — D75A Glucose-6-phosphate dehydrogenase (G6PD) deficiency without anemia: Secondary | ICD-10-CM

## 2016-11-27 LAB — CBC WITH DIFFERENTIAL/PLATELET
BASOS ABS: 41 {cells}/uL (ref 0–200)
Basophils Relative: 1 %
EOS ABS: 41 {cells}/uL (ref 15–500)
Eosinophils Relative: 1 %
HCT: 36 % (ref 35.0–45.0)
Hemoglobin: 11.7 g/dL (ref 11.7–15.5)
LYMPHS PCT: 31 %
Lymphs Abs: 1271 cells/uL (ref 850–3900)
MCH: 29.8 pg (ref 27.0–33.0)
MCHC: 32.5 g/dL (ref 32.0–36.0)
MCV: 91.8 fL (ref 80.0–100.0)
MONOS PCT: 7 %
MPV: 12.1 fL (ref 7.5–12.5)
Monocytes Absolute: 287 cells/uL (ref 200–950)
NEUTROS PCT: 60 %
Neutro Abs: 2460 cells/uL (ref 1500–7800)
PLATELETS: 238 10*3/uL (ref 140–400)
RBC: 3.92 MIL/uL (ref 3.80–5.10)
RDW: 14.1 % (ref 11.0–15.0)
WBC: 4.1 10*3/uL (ref 3.8–10.8)

## 2016-11-27 NOTE — Telephone Encounter (Signed)
Can you please check on patient's viscosupplementation and call patient? Thank you.

## 2016-11-27 NOTE — Telephone Encounter (Signed)
Currently waiting for VOB.  Application has been faxed to [phone]. 

## 2016-11-27 NOTE — Telephone Encounter (Signed)
Currently waiting for VOB.  Application has been faxed to [phone].

## 2016-11-27 NOTE — Patient Instructions (Addendum)
Standing Labs We placed an order today for your standing lab work.    Please come back and get your standing labs in September and every 3 months  We have open lab Monday through Friday from 8:30-11:30 AM and 1:30-4 PM at the office of Dr. Arbutus Ped, PA.   The office is located at [address], [place], [place] [address] No appointment is necessary.   Labs are drawn by [facility].  You may receive a bill from [facility] for your lab work. If you have any questions regarding directions or hours of operation,  please call [phone].   Knee Exercises Ask your health care provider which exercises are safe for you. Do exercises exactly as told by your health care provider and adjust them as directed. It is normal to feel mild stretching, pulling, tightness, or discomfort as you do these exercises, but you should stop right away if you feel sudden pain or your pain gets worse.Do not begin these exercises until told by your health care provider. STRETCHING AND RANGE OF MOTION EXERCISES These exercises warm up your muscles and joints and improve the movement and flexibility of your knee. These exercises also help to relieve pain, numbness, and tingling. Exercise A: Knee Extension, Prone 1. Lie on your abdomen on a bed. 2. Place your left / right knee just beyond the edge of the surface so your knee is not on the bed. You can put a towel under your left / right thigh just above your knee for comfort. 3. Relax your leg muscles and allow gravity to straighten your knee. You should feel a stretch behind your left / right knee. 4. Hold this position for __________ seconds. 5. Scoot up so your knee is supported between repetitions. Repeat __________ times. Complete this stretch __________ times a day. Exercise B: Knee Flexion, Active  1. Lie on your back with both knees straight. If this causes back discomfort, bend your left / right knee so your foot is flat on the  floor. 2. Slowly slide your left / right heel back toward your buttocks until you feel a gentle stretch in the front of your knee or thigh. 3. Hold this position for __________ seconds. 4. Slowly slide your left / right heel back to the starting position. Repeat __________ times. Complete this exercise __________ times a day. Exercise C: Quadriceps, Prone  1. Lie on your abdomen on a firm surface, such as a bed or padded floor. 2. Bend your left / right knee and hold your ankle. If you cannot reach your ankle or pant leg, loop a belt around your foot and grab the belt instead. 3. Gently pull your heel toward your buttocks. Your knee should not slide out to the side. You should feel a stretch in the front of your thigh and knee. 4. Hold this position for __________ seconds. Repeat __________ times. Complete this stretch __________ times a day. Exercise D: Hamstring, Supine 1. Lie on your back. 2. Loop a belt or towel over the ball of your left / right foot. The ball of your foot is on the walking surface, right under your toes. 3. Straighten your left / right knee and slowly pull on the belt to raise your leg until you feel a gentle stretch behind your knee. ? Do not let your left / right knee bend while you do this. ? Keep your other leg flat on the floor. 4. Hold this position for __________ seconds. Repeat __________ times.  Complete this stretch __________ times a day. STRENGTHENING EXERCISES These exercises build strength and endurance in your knee. Endurance is the ability to use your muscles for a long time, even after they get tired. Exercise E: Quadriceps, Isometric  1. Lie on your back with your left / right leg extended and your other knee bent. Put a rolled towel or small pillow under your knee if told by your health care provider. 2. Slowly tense the muscles in the front of your left / right thigh. You should see your kneecap slide up toward your hip or see increased dimpling just  above the knee. This motion will push the back of the knee toward the floor. 3. For __________ seconds, keep the muscle as tight as you can without increasing your pain. 4. Relax the muscles slowly and completely. Repeat __________ times. Complete this exercise __________ times a day. Exercise F: Straight Leg Raises - Quadriceps 1. Lie on your back with your left / right leg extended and your other knee bent. 2. Tense the muscles in the front of your left / right thigh. You should see your kneecap slide up or see increased dimpling just above the knee. Your thigh may even shake a bit. 3. Keep these muscles tight as you raise your leg 4-6 inches (10-15 cm) off the floor. Do not let your knee bend. 4. Hold this position for __________ seconds. 5. Keep these muscles tense as you lower your leg. 6. Relax your muscles slowly and completely after each repetition. Repeat __________ times. Complete this exercise __________ times a day. Exercise G: Hamstring, Isometric 1. Lie on your back on a firm surface. 2. Bend your left / right knee approximately __________ degrees. 3. Dig your left / right heel into the surface as if you are trying to pull it toward your buttocks. Tighten the muscles in the back of your thighs to dig as hard as you can without increasing any pain. 4. Hold this position for __________ seconds. 5. Release the tension gradually and allow your muscles to relax completely for __________ seconds after each repetition. Repeat __________ times. Complete this exercise __________ times a day. Exercise H: Hamstring Curls  If told by your health care provider, do this exercise while wearing ankle weights. Begin with __________ weights. Then increase the weight by 1 lb (0.5 kg) increments. Do not wear ankle weights that are more than __________. 1. Lie on your abdomen with your legs straight. 2. Bend your left / right knee as far as you can without feeling pain. Keep your hips flat against  the floor. 3. Hold this position for __________ seconds. 4. Slowly lower your leg to the starting position.  Repeat __________ times. Complete this exercise __________ times a day. Exercise I: Squats (Quadriceps) 1. Stand in front of a table, with your feet and knees pointing straight ahead. You may rest your hands on the table for balance but not for support. 2. Slowly bend your knees and lower your hips like you are going to sit in a chair. ? Keep your weight over your heels, not over your toes. ? Keep your lower legs upright so they are parallel with the table legs. ? Do not let your hips go lower than your knees. ? Do not bend lower than told by your health care provider. ? If your knee pain increases, do not bend as low. 3. Hold the squat position for __________ seconds. 4. Slowly push with your legs to return to standing.  Do not use your hands to pull yourself to standing. Repeat __________ times. Complete this exercise __________ times a day. Exercise J: Wall Slides (Quadriceps)  1. Lean your back against a smooth wall or door while you walk your feet out 18-24 inches (46-61 cm) from it. 2. Place your feet hip-width apart. 3. Slowly slide down the wall or door until your knees bend __________ degrees. Keep your knees over your heels, not over your toes. Keep your knees in line with your hips. 4. Hold for __________ seconds. Repeat __________ times. Complete this exercise __________ times a day. Exercise K: Straight Leg Raises - Hip Abductors 1. Lie on your side with your left / right leg in the top position. Lie so your head, shoulder, knee, and hip line up. You may bend your bottom knee to help you keep your balance. 2. Roll your hips slightly forward so your hips are stacked directly over each other and your left / right knee is facing forward. 3. Leading with your heel, lift your top leg 4-6 inches (10-15 cm). You should feel the muscles in your outer hip lifting. ? Do not let  your foot drift forward. ? Do not let your knee roll toward the ceiling. 4. Hold this position for __________ seconds. 5. Slowly return your leg to the starting position. 6. Let your muscles relax completely after each repetition. Repeat __________ times. Complete this exercise __________ times a day. Exercise L: Straight Leg Raises - Hip Extensors 1. Lie on your abdomen on a firm surface. You can put a pillow under your hips if that is more comfortable. 2. Tense the muscles in your buttocks and lift your left / right leg about 4-6 inches (10-15 cm). Keep your knee straight as you lift your leg. 3. Hold this position for __________ seconds. 4. Slowly lower your leg to the starting position. 5. Let your leg relax completely after each repetition. Repeat __________ times. Complete this exercise __________ times a day. This information is not intended to replace advice given to you by your health care provider. Make sure you discuss any questions you have with your health care provider. Document Released: 04/23/2005 Document Revised: 03/03/2016 Document Reviewed: 04/15/2015 Elsevier Interactive Patient Education  2018 [employer].

## 2016-11-28 LAB — C3 AND C4
C3 Complement: 152 mg/dL (ref 83–193)
C4 Complement: 30 mg/dL (ref 15–57)

## 2016-11-28 LAB — URINALYSIS, ROUTINE W REFLEX MICROSCOPIC
BILIRUBIN URINE: NEGATIVE
GLUCOSE, UA: NEGATIVE
Hgb urine dipstick: NEGATIVE
KETONES UR: NEGATIVE
Leukocytes, UA: NEGATIVE
Nitrite: NEGATIVE
PH: 6 (ref 5.0–8.0)
SPECIFIC GRAVITY, URINE: 1.029 (ref 1.001–1.035)

## 2016-11-28 LAB — COMPLETE METABOLIC PANEL WITH GFR
ALT: 14 U/L (ref 6–29)
AST: 18 U/L (ref 10–30)
Albumin: 4.1 g/dL (ref 3.6–5.1)
Alkaline Phosphatase: 77 U/L (ref 33–115)
BUN: 11 mg/dL (ref 7–25)
CHLORIDE: 104 mmol/L (ref 98–110)
CO2: 17 mmol/L — AB (ref 20–31)
CREATININE: 0.7 mg/dL (ref 0.50–1.10)
Calcium: 9 mg/dL (ref 8.6–10.2)
GFR, Est African American: >89 mL/min (ref >=60)
GFR, Est Non African American: >89 mL/min (ref >=60)
GLUCOSE: 76 mg/dL (ref 65–99)
Potassium: 3.6 mmol/L (ref 3.5–5.3)
SODIUM: 140 mmol/L (ref 135–146)
Total Bilirubin: 0.5 mg/dL (ref 0.2–1.2)
Total Protein: 8.3 g/dL — ABNORMAL HIGH (ref 6.1–8.1)

## 2016-11-28 LAB — RNP ANTIBODY: RIBONUCLEIC PROTEIN(ENA) ANTIBODY, IGG: POSITIVE — AB

## 2016-11-28 LAB — ANTI-SMITH ANTIBODY: ENA SM AB SER-ACNC: POSITIVE — AB

## 2016-11-28 LAB — VITAMIN D 25 HYDROXY (VIT D DEFICIENCY, FRACTURES): Vit D, 25-Hydroxy: 26 ng/mL — ABNORMAL LOW (ref 30–100)

## 2016-11-28 LAB — SEDIMENTATION RATE: Sed Rate: 38 mm/hr — ABNORMAL HIGH (ref 0–20)

## 2016-11-28 LAB — ANTI-DNA ANTIBODY, DOUBLE-STRANDED

## 2016-12-02 NOTE — Progress Notes (Signed)
Vit D 50000 U qweek #90 d, recheck Vit D in 3 month. Rest of the labs are stable.

## 2016-12-03 ENCOUNTER — Ambulatory Visit: Payer: 59 | Admitting: Rheumatology

## 2016-12-03 ENCOUNTER — Telehealth: Payer: Self-pay | Admitting: Radiology

## 2016-12-03 MED ORDER — VITAMIN D3 1.25 MG (50000 UT) PO CAPS: 50000.0000 [IU] | ORAL_CAPSULE | ORAL | 0 refills | Status: AC

## 2016-12-03 NOTE — Telephone Encounter (Signed)
I have called patient to advise labs are stable, Vit D low, Sent in Vit D for her to pharmacy.

## 2016-12-03 NOTE — Telephone Encounter (Signed)
-----   Message from [name], MD sent at 12/02/2016  1:10 PM EDT ----- Vit D 50000 U qweek #90 d, recheck Vit D in 3 month. Rest of the labs are stable.

## 2016-12-08 ENCOUNTER — Telehealth: Payer: Self-pay | Admitting: Internal Medicine

## 2016-12-08 ENCOUNTER — Telehealth: Payer: Self-pay | Admitting: Rheumatology

## 2016-12-08 DIAGNOSIS — R0602 Shortness of breath: Secondary | ICD-10-CM

## 2016-12-08 NOTE — Telephone Encounter (Signed)
Patient states she saw her labs on my chart and has noticed that her CO2 level is decreasing. Patient is concerned and would like to now what level is to low. Patient's most recent level was 17. Patient's CO2 level is usually mid to upper 20s. Please advise.

## 2016-12-08 NOTE — Telephone Encounter (Signed)
I spoke with patient today. She's concerned about her low CO2 . She does have interstitial lung disease. She states that she does have some shortness of breath. I'm uncertain if her labs are related to her shortness of breath. I advised her to repeat the labs. She would like to get the opinion of Dr. Marchelle Gearing before repeating the labs. I have advised her to contact Dr. Marchelle Gearing.

## 2016-12-08 NOTE — Telephone Encounter (Signed)
Patient would like to speak with you in regards to some lab results that she seen.  Thank you.

## 2016-12-08 NOTE — Telephone Encounter (Signed)
Pt states she had her CO2 checked Xapprox 2 weeks ago with Dr. Corliss Skains and is concerned that her labs are trending down- most recent CO2 reading was 17.  Previous reading was 23, and reading prior to that was 25.  Labs are available in Epic. Pt has no current breathing complaints, but is concerned that they are continually dropping and would like MR's recs on this.  Pt is ok with waiting until MR returns to clinic later this week for his recs- aware that he will return on Wednesday.  MR please advise.  Thanks!

## 2016-12-11 NOTE — Telephone Encounter (Signed)
IS she on  A medication called topiramate for weight los or headache? If not, then please have her check ABG at pft lab. And then post ABG return to see me or an APP  Thanks  Dr. Kalman Shan, M.D., [facility].C.P Pulmonary and Critical Care Medicine Staff Physician [facility]  Pulmonary and Critical Care Pager: [phone], If no answer or between  15:00h - 7:00h: call [phone]  12/11/2016 8:18 AM

## 2016-12-11 NOTE — Telephone Encounter (Signed)
Pt is out of town right now but is coming back on Saturday 12/13/16 for a few days --she states that she will be going back out of town from 6/26-7/5. Requests appt on 12/15/16 if possible.   Pt scheduled with MW to review ABG results 12/15/16 at 11:30 and ABG is scheduled at [facility] at 9:30 on 12/15/16.  Will send to Dr Marchelle Gearing as Lorain Childes.

## 2016-12-15 ENCOUNTER — Ambulatory Visit (HOSPITAL_COMMUNITY)
Admission: RE | Admit: 2016-12-15 | Discharge: 2016-12-15 | Disposition: A | Discharge status: Home / Self Care | Payer: 59 | Source: Ambulatory Visit | Attending: Internal Medicine | Admitting: Internal Medicine

## 2016-12-15 ENCOUNTER — Ambulatory Visit (INDEPENDENT_AMBULATORY_CARE_PROVIDER_SITE_OTHER): Payer: 59 | Admitting: Internal Medicine

## 2016-12-15 ENCOUNTER — Encounter: Payer: Self-pay | Admitting: Internal Medicine

## 2016-12-15 VITALS — BP 130/76 | HR 106 | Ht 71.0 in | Wt 230.0 lb

## 2016-12-15 DIAGNOSIS — J45991 Cough variant asthma: Secondary | ICD-10-CM | POA: Diagnosis not present

## 2016-12-15 DIAGNOSIS — R0602 Shortness of breath: Secondary | ICD-10-CM | POA: Insufficient documentation

## 2016-12-15 DIAGNOSIS — J849 Interstitial pulmonary disease, unspecified: Secondary | ICD-10-CM

## 2016-12-15 LAB — BLOOD GAS, ARTERIAL
ACID-BASE EXCESS: 0.4 mmol/L (ref 0.0–2.0)
Bicarbonate: 23.5 mmol/L (ref 20.0–28.0)
DRAWN BY: 244901
FIO2: 21
O2 Saturation: 97.8 %
PH ART: 7.455 — AB (ref 7.350–7.450)
Patient temperature: 98.6
pCO2 arterial: 33.9 mmHg (ref 32.0–48.0)
pO2, Arterial: 106 mmHg (ref 83.0–108.0)

## 2016-12-15 MED ORDER — BUDESONIDE-FORMOTEROL FUMARATE 80-4.5 MCG/ACT IN AERO: 2.0000 | INHALATION_SPRAY | Freq: Two times a day (BID) | RESPIRATORY_TRACT | 0 refills | Status: DC

## 2016-12-15 MED ORDER — BUDESONIDE-FORMOTEROL FUMARATE 80-4.5 MCG/ACT IN AERO: 2.0000 | INHALATION_SPRAY | Freq: Two times a day (BID) | RESPIRATORY_TRACT | 11 refills | Status: DC

## 2016-12-15 NOTE — Progress Notes (Signed)
Subjective:     Patient ID: Shannon [name], female   DOB: 10-20-1970, 46 y.o.   MRN: [id number]  HPI  PCP [name],[name], PA-C Referred by Dr [name]  HPI   IOV 08/23/2014  Chief Complaint  Patient presents with  . Pulmonary Consult    Pt referred by Dr. Corliss Skains for ILD. Pt c/o SOB with activity and rest, dry cough and chest tightness also with and without activity.    46 year old female referred for evaluation of autoimmune interstitial lung disease. She presents with her husband. In 2010 while living in [place] she reports she was diagnosed with lupus associated with rheumatoid arthritis in her joints. In 2012 she moved to live in [facility] and several months after that started noticing insidious onset of shortness of breath. Local rheumatologist diagnosed her with interstitial lung disease. She was referred to Dr. Herma Carson at [facility] in [facility] and was started on CellCept/prednisone for autoimmune interstitial lung disease he and however, sometime around 2 years ago she ran out of medical insurance and stopped taking these medications. During this time her dyspnea has progressed. It is currently rated as moderate to severe. It is present on exertion and relieved by rest. Even minimal amount of exertion makes her very dyspneic. Now she has insurance and she did see Dr. Frederik Pear locally and she has autoimmune panel lab ordered. In addition exam revealed crackles and there for she's been referred here for reevaluation of interstitial lung disease and dyspnea. Dyspnea is associated with some chest tightness but no chest pain. This no associated dizzine   11/24/2014 Follow UP ILD  [name]  She has autoimmune ILD with RA/Lupus  She was seen 6 weeks ago, restarted on Cellcept.  Previously on cellcept but lost her insurance until recently.  On low dose prednisone 5mg  daily .  Last CT chest 4/4 showed ILD changes similar to 2013. Echo  was ok with EFG 55-60%, nml PAP . In March .   Did not take dapsone ,due to  GP6D deficiency.  Labs ok last week with nml LFT , no sign change in hbg. /wbc.  She is feeling better. Does feel her breathing is some better.  No flare of cough or wheezing.     OV 01/16/2015  Chief Complaint  Patient presents with  . Follow-up    Pt c/o DOE, mild dry cough, and chest tightness when SOB. Pt states the chest tightness has improved).    follow-up interstitial lung disease in the setting of rheumatoid arthritis Follow-up high risk medication use - on CellCept and prednisone since mid April 2016  - Presents with her husband. Both give a history. Overall she is doing better in terms of dyspnea after starting CellCept and prednisone. However the improvement this only moderate. She still left with a residual moderate dyspnea on exertion that is also made worse with bending or heart air and improved with rest and cool air. It is associated with some cough and wheezing. She takes albuterol inhaler which she feels helps only somewhat but not fully and not quickly enough. She is frustrated by this. In addition she's complaining of some associated right lower back paraspinal spasm for which massage gives her relief. He has never attended pulmonary rehabilitation.  - Lab review shows she has had problems with potassium and has had potassium supplementation. Last lab check was 12/08/2014. She is due for lab test right now   OV 03/28/2015  Chief Complaint  Patient presents with  . Follow-up    3 month follow up. Pt states that she is still having some problems with her breathing. Pt c/o of feeling chest tightness, chest pain and cough that is dry. Pt denies wheeze. Pt states that she did trial the Advair and does feel that it helped some.   Follow-up interstitial lung disease secondary to autoimmune process and associated dyspnea that seems out of proportion\  She presents with her husband. She continues  on CellCept and prednisone. In the last visit approximately 2-3 months ago she had out of proportion dyspnea. I gave her some Advair to try. She says this only helped a little bit. Overall she says that dyspnea still persists. It is worse than in the spring 2016 when she started CellCept and prednisone. It is stable since July 2016. It is moderate in intensity. Occurs randomly at rest but also with exertion. Occasionally relieved by rest but also happens at rest. Heat and humidity make it worse. Advair helped a little bit only. This no associated chest tightness or wheezing. She did try and enroll  in pulmonary rehabilitation but could not afford the the startup program and is waiting to hear from them for the maintenance program. She is frustrated by all the symptoms.  Pulmonary function test October 15,016 today FVC 2.55 L/64%, total lung capacity 4 L/65% and diffusion 14.5/42%. Overall no change since April 2016   OV 07/04/2015  Chief Complaint  Patient presents with  . Follow-up    pt. states breathing is baseline. SOB. dry cough. wheezing. occ. chest pain/tightness. feels her airway is blocked.   Follow-up interstitial lung disease admitted autoimmune processes and associated out of proportion dyspnea  She presents again with her husband. She continues on CellCept 2 g twice daily associated with prednisone. She cannot take Bactrim or dapsone due to G6PD deficiency. Last seen in the fall of 2016. She was having out of proportion dyspnea. Rated cardia pulmonary stress test and she could not tolerate this test. She then underwent right heart catheterization mid-November 2016 with Dr. Marca Ancona. Review the tests this is normal. Overall stable but she and husband still continued to complain about this resting dyspnea associated with wheezing. They hear noises in the chest. Sometime she gasps for air even at rest. She feels it comes from the chest but the husband points to the throat. I offered  second opinion at [facility] because of this unusual symptoms but they declined citing distance. She was supposed to attend pulmonary rehabilitation but they cannot afford a $60 co-pay twice a week 8 weeks. Offered ENT evaluation that agreeable but they wanted done in  which is closer logistically. In addition patient is contemplating now applying for disability. 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  Walking desaturation test 185 feet 3 laps and rheumatic: No desaturation    OV 10/12/2015  Chief Complaint  Patient presents with  . Follow-up    PFT today.  breathing is better.  Pt is currently on STD, [employer] approved STD until today, wants to know today if pt is able to return to work, any restrictions.  Pt states that she has been more stable while out of work.      Follow-up interstitial lung disease due to autoimmune processes not otherwise specified  Last seen January 2017. Since then she has been on short-term disability and out of work. She does heavy manual work. The lack of work estimated  dyspnea better. She did pulmonary function test today that I personally visualized and overall it is same. There are no new issues. She is on CellCept and she is tolerating this fine. Last visit she had some anemia we follow this up and the anemia was better. Also hypokalemia resolved by February 2017. She is due for blood work today. 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 This showed up at last visit. She saw an ENT in [place] within referred her to [facility]. She does not want to go to [facility]. She ended up seeing Dr. Ezzard Standing locally. But now she is going to see Dr.  Dina Rich [facility]   Pulmonary function test today 10/12/2015 shows postbronchodilator FVC 2.65 L/67%. FEV1 2.34 L/72% which is up 17% positive bronchodilator response. Ratio of 80/106%. Total  lung capacity of 3.8/62%. DLCO 18.36/52%. Overall consistent with restriction and lung parenchymal disease. Overall PFTs are stable compared to April 2016 but perhaps in DLCO slightly better   OV 04/15/2016  Chief Complaint  Patient presents with  . Interstitial Lung Disease    Breathing is unchanged since last OV. Reports SOB, coughing. Cough is non productive. Denies chest tightness or wheezing.   Follow-up interstitial lung disease due to autoimmune processes not otherwise specified. On CellCept and prednisone. Not on Bactrim prophylaxis due to G6PD deficiency  Last visit April 2017. At that time based on pulmonary function tests showed stability and ILD for a year. 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 She did see local ENT doctor [place] and apparently has been reassured. At this point in time she is applying for long-term disability at work but the work discharged from services and she is now applying for Social Security disability. Her dyspnea stable since the interim. She's also had hysterectomy but for the last few weeks her cough is worse and it is dry. This no fever. Is no weight loss or chills. She says she's compliant with her CellCept and prednisone.  OV 05/29/2016  Chief Complaint  Patient presents with  . Follow-up    Pt states she still has harsh dry cough, pt states her SOB is unchanged and chest tightness when SOB or when coughing a lot. Pt deneis f/c/s.     Follow-up interstitial lung disease due to autoimmune processes not otherwise specified. On CellCept and prednisone since April 2016. Not on Bactrim prophylaxis due to G6PD deficiency  Kynzli returns for follow-up. She had high resolution CT chest that shows stability and interstitial lung disease since April 2016. She Pulmicort function test that shows mild improvement since April 2016. Therefore it appears that her CellCept and prednisone is helping her she'll lung disease related  to collagen-vascular disease. However she tells me that she continues to have chronic cough for the last few to several months. It is progressive. It is dry. It is worse than her baseline. The dyspnea is unchanged. This no fever or weight loss or chills   OV 07/24/2016  Chief Complaint  Patient presents with  . Follow-up    Pt states she feels the Arnuity has been helping her breathing. Pt c/o dry cough and occ chest tightness.   rec    ICD-9-CM ICD-10-CM   1. ILD (interstitial lung disease) (HCC) 515 J84.9   2. Chronic cough 786.2 R05   3. High risk medication use V58.69 Z79.899  Clinically improved with cellcept/prednisone and arnuity Blood work ok dec 2017  pLAN - start pulm rehab at [facility] - continue  aruity daily - continue cellcept and prednisone  Followup - 6 months do Pre-bd spiro and dlco only. No lung volume or bd response. No post-bd spiro - 6 months fu Dr Marchelle Gearing after above or sooner if needed     12/15/2016  f/u ov/Linzy Darling re: cough dry on arnuity  Chief Complaint  Patient presents with  . Follow-up    Breathing is unchanged since her last visit. Pt states she is here to f/u on recent ABG result. She c/o feeling tired all of the time. She has occ cough- non prod.       Finished at Rehab   beginning  Of May  2018 and trouble walking fast or uphill Waking up with ha's since rehab sev days a week  Cough is new x one month mostly dry and day > noct   No obvious day to day or daytime variability or assoc excess/ purulent sputum or mucus plugs or hemoptysis or cp or chest tightness, subjective wheeze or overt sinus or hb symptoms. No unusual exp hx or h/o childhood pna/ asthma or knowledge of premature birth.  Sleeping ok without nocturnal  or early am exacerbation  of respiratory  c/o's or need for noct saba. Also denies any obvious fluctuation of symptoms with weather or environmental changes or other aggravating or alleviating factors except as outlined above    Current Medications, Allergies, Complete Past Medical History, Past Surgical History, Family History, and Social History were reviewed in [employer] record.  ROS  The following are not active complaints unless bolded sore throat, dysphagia, dental problems, itching, sneezing,  nasal congestion or excess/ purulent secretions, ear ache,   fever, chills, sweats, unintended wt loss, classically pleuritic or exertional cp,  orthopnea pnd or leg swelling, presyncope, palpitations, abdominal pain, anorexia, nausea, vomiting, diarrhea  or change in bowel or bladder habits, change in stools or urine, dysuria,hematuria,  rash, arthralgias, visual complaints, headache, numbness, weakness or ataxia or problems with walking or coordination,  change in mood/affect or memory.                         Objective:   Physical Exam    amb bf nad  Wt Readings from Last 3 Encounters:  12/15/16 230 lb (104.3 kg)  11/27/16 229 lb (103.9 kg)  09/09/16 229 lb (103.9 kg)    Vital signs reviewed - Note on arrival 02 sats  100% on RA      HEENT: nl dentition, turbinates bilaterally, and oropharynx. Nl external ear canals without cough reflex   NECK :  without JVD/Nodes/TM/ nl carotid upstrokes bilaterally   LUNGS: no acc muscle use,  Nl contour chest with insp crackles both bases but no cough on insp   CV:  RRR  no s3 or murmur or increase in P2, and no edema   ABD:  soft and nontender with nl inspiratory excursion in the supine position. No bruits or organomegaly appreciated, bowel sounds nl  MS:  Nl gait/ ext warm without deformities, calf tenderness, cyanosis or clubbing No obvious joint restrictions   SKIN: warm and dry without lesions    NEURO:  alert, approp, nl sensorium with  no motor or cerebellar deficits apparent.    Labs reviewed:  ABG RA  12/15/2016  = 7.46/34/106     Assessment:

## 2016-12-15 NOTE — Patient Instructions (Addendum)
Change the arnuity to symbicort 80 Take 2 puffs first thing in am and then another 2 puffs about 12 hours later.   Work on inhaler technique:  relax and gently blow all the way out then take a nice smooth deep breath back in, triggering the inhaler at same time you start breathing in.  Hold for up to 5 seconds if you can. Blow out thru nose. Rinse and gargle with water when done     GERD (REFLUX)  is an extremely common cause of respiratory symptoms just like yours , many times with no obvious heartburn at all.    It can be treated with medication, but also with lifestyle changes including elevation of the head of your bed (ideally with 6 inch  bed blocks),  Smoking cessation, avoidance of late meals, excessive alcohol, and avoid fatty foods, chocolate, peppermint, colas, red wine, and acidic juices such as orange juice.  NO MINT OR MENTHOL PRODUCTS SO NO COUGH DROPS   USE SUGARLESS CANDY INSTEAD (Jolley ranchers or Stover's or Life Savers) or even ice chips will also do - the key is to swallow to prevent all throat clearing. NO OIL BASED VITAMINS - use powdered substitutes.   Keep appt with Dr Marchelle Gearing - call sooner if needed

## 2016-12-16 ENCOUNTER — Telehealth: Payer: Self-pay | Admitting: Internal Medicine

## 2016-12-16 NOTE — Telephone Encounter (Signed)
Let he rknow abg is normal  Dr. Kalman Shan, M.D., [facility].C.P Pulmonary and Critical Care Medicine Staff Physician [facility] Pager: [phone], If no answer or between  15:00h - 7:00h: call [phone]  12/16/2016 4:12 AM

## 2016-12-17 DIAGNOSIS — J45991 Cough variant asthma: Secondary | ICD-10-CM | POA: Insufficient documentation

## 2016-12-17 NOTE — Assessment & Plan Note (Signed)
Body mass index is 32.08 kg/m.  -  trending up slightly  Lab Results  Component Value Date   TSH 1.870 12/19/2015     Contributing to gerd risk/ doe/reviewed the need and the process to achieve and maintain neg calorie balance > defer f/u primary care including intermittently monitoring thyroid status

## 2016-12-17 NOTE — Assessment & Plan Note (Signed)
Changed from dpi Symbicort 80 12/15/2016  plus reviewed reflux diet. - 12/15/2016  After extensive coaching HFA effectiveness =    75%   Dry daytime cough worsening on arunity is typical of Upper airway cough syndrome (previously labeled PNDS) , is  so named because it's frequently impossible to sort out how much is  CR/sinusitis with freq throat clearing (which can be related to primary GERD)   vs  causing  secondary (" extra esophageal")  GERD from wide swings in gastric pressure that occur with throat clearing, often  promoting self use of mint and menthol lozenges that reduce the lower esophageal sphincter tone and exacerbate the problem further in a cyclical fashion.   These are the same pts (now being labeled as having "irritable larynx syndrome" by some cough centers) who not infrequently have a history of having failed to tolerate ace inhibitors,  dry powder inhalers or biphosphonates or report having atypical/extraesophageal reflux symptoms that don't respond to standard doses of PPI  and are easily confused as having aecopd or asthma flares by even experienced allergists/ pulmonologists (myself included).   We will try first on low-dose Symbicort and reflux diet but would have a very low threshold to add a more aggressive acid suppression regimen if she continues to cough.  I had an extended discussion with the patient reviewing all relevant studies completed to date and  lasting 25 minutes of a 40  minute office  visit with pt new to me    re  severe non-specific but potentially very serious refractory respiratory symptoms of uncertain and potentially multiple  etiologies.   Each maintenance medication was reviewed in detail including most importantly the difference between maintenance and prns and under what circumstances the prns are to be triggered using an action plan format that is not reflected in the computer generated alphabetically organized AVS.    Please see AVS for specific  instructions unique to this office visit that I personally wrote and verbalized to the the pt in detail and then reviewed with pt  by my nurse highlighting any changes in therapy/plan of care  recommended at today's visit.

## 2016-12-17 NOTE — Telephone Encounter (Signed)
I spoke with patient. She states she went to see Dr. Marchelle Gearing. She had blood gas done which showed normal bicarbonate.

## 2016-12-17 NOTE — Assessment & Plan Note (Signed)
ABGs as noted with no change in exercise tolerance so nothing further to add to Dr. Jane Canary plans.

## 2016-12-18 NOTE — Telephone Encounter (Signed)
ATC pt at both numbers listed. Phones would not ring. Will attempt again later.

## 2016-12-23 ENCOUNTER — Telehealth (INDEPENDENT_AMBULATORY_CARE_PROVIDER_SITE_OTHER): Payer: Self-pay

## 2016-12-23 NOTE — Telephone Encounter (Signed)
Faxed [employer] to [phone].

## 2016-12-25 NOTE — Telephone Encounter (Signed)
lmtcb for pt.  

## 2016-12-26 NOTE — Telephone Encounter (Signed)
lmtcb for pt.  

## 2016-12-29 ENCOUNTER — Telehealth: Payer: Self-pay | Admitting: Internal Medicine

## 2016-12-29 NOTE — Telephone Encounter (Signed)
Spoke with pt. She is aware of results. Nothing further was needed.  

## 2016-12-29 NOTE — Telephone Encounter (Signed)
Please see telephone encounter from 12/16/16.

## 2017-01-05 ENCOUNTER — Telehealth (INDEPENDENT_AMBULATORY_CARE_PROVIDER_SITE_OTHER): Payer: Self-pay

## 2017-01-05 NOTE — Telephone Encounter (Signed)
Called and left VM advising patient that she should receive a call from [facility] to consent to bill her health benefits for Hyalgan Injection.  Once she has done this she will then receive a call from Dr. Fatima Sanger office to schedule an appt.to have injection.

## 2017-01-13 ENCOUNTER — Telehealth: Payer: Self-pay | Admitting: Internal Medicine

## 2017-01-13 MED ORDER — MYCOPHENOLATE MOFETIL 500 MG PO TABS: 1000.0000 mg | ORAL_TABLET | Freq: Two times a day (BID) | ORAL | 1 refills | Status: DC

## 2017-01-13 NOTE — Telephone Encounter (Signed)
Called and spoke to pt. Pt is requesting refill of Cellcept, send to [facility] pharmacy. Rx sent to preferred pharmacy. Pt verbalized understanding and denied any further questions or concerns at this time.

## 2017-01-20 ENCOUNTER — Ambulatory Visit: Payer: 59 | Admitting: Diagnostic Neuroimaging

## 2017-01-21 ENCOUNTER — Encounter: Payer: Self-pay | Admitting: Diagnostic Neuroimaging

## 2017-02-09 ENCOUNTER — Encounter: Payer: Self-pay | Admitting: *Deleted

## 2017-02-13 ENCOUNTER — Ambulatory Visit (INDEPENDENT_AMBULATORY_CARE_PROVIDER_SITE_OTHER)
Admission: RE | Admit: 2017-02-13 | Discharge: 2017-02-13 | Disposition: A | Discharge status: Home / Self Care | Payer: 59 | Source: Ambulatory Visit | Attending: Internal Medicine | Admitting: Internal Medicine

## 2017-02-13 ENCOUNTER — Encounter: Payer: Self-pay | Admitting: Internal Medicine

## 2017-02-13 ENCOUNTER — Ambulatory Visit (INDEPENDENT_AMBULATORY_CARE_PROVIDER_SITE_OTHER): Payer: 59 | Admitting: Internal Medicine

## 2017-02-13 ENCOUNTER — Other Ambulatory Visit (INDEPENDENT_AMBULATORY_CARE_PROVIDER_SITE_OTHER): Payer: 59

## 2017-02-13 VITALS — BP 138/88 | HR 74 | Ht 71.5 in | Wt 230.0 lb

## 2017-02-13 DIAGNOSIS — J849 Interstitial pulmonary disease, unspecified: Secondary | ICD-10-CM

## 2017-02-13 DIAGNOSIS — J209 Acute bronchitis, unspecified: Secondary | ICD-10-CM | POA: Diagnosis not present

## 2017-02-13 DIAGNOSIS — Z79899 Other long term (current) drug therapy: Secondary | ICD-10-CM

## 2017-02-13 LAB — CBC WITH DIFFERENTIAL/PLATELET
BASOS PCT: 0.4 % (ref 0.0–3.0)
Basophils Absolute: 0 10*3/uL (ref 0.0–0.1)
EOS ABS: 0 10*3/uL (ref 0.0–0.7)
Eosinophils Relative: 1.1 % (ref 0.0–5.0)
HEMATOCRIT: 35.9 % — AB (ref 36.0–46.0)
Hemoglobin: 11.6 g/dL — ABNORMAL LOW (ref 12.0–15.0)
LYMPHS PCT: 35.5 % (ref 12.0–46.0)
Lymphs Abs: 1.3 10*3/uL (ref 0.7–4.0)
MCHC: 32.4 g/dL (ref 30.0–36.0)
MCV: 93.6 fl (ref 78.0–100.0)
Monocytes Absolute: 0.3 10*3/uL (ref 0.1–1.0)
Monocytes Relative: 7.9 % (ref 3.0–12.0)
NEUTROS ABS: 2 10*3/uL (ref 1.4–7.7)
Neutrophils Relative %: 55.1 % (ref 43.0–77.0)
PLATELETS: 198 10*3/uL (ref 150.0–400.0)
RBC: 3.84 Mil/uL — ABNORMAL LOW (ref 3.87–5.11)
RDW: 13 % (ref 11.5–15.5)
WBC: 3.7 10*3/uL — ABNORMAL LOW (ref 4.0–10.5)

## 2017-02-13 LAB — HEPATIC FUNCTION PANEL
ALBUMIN: 3.8 g/dL (ref 3.5–5.2)
ALT: 7 U/L (ref 0–35)
AST: 11 U/L (ref 0–37)
Alkaline Phosphatase: 57 U/L (ref 39–117)
BILIRUBIN TOTAL: 0.4 mg/dL (ref 0.2–1.2)
Bilirubin, Direct: 0.2 mg/dL (ref 0.0–0.3)
Total Protein: 7.7 g/dL (ref 6.0–8.3)

## 2017-02-13 LAB — BASIC METABOLIC PANEL
BUN: 11 mg/dL (ref 6–23)
CALCIUM: 9.1 mg/dL (ref 8.4–10.5)
CO2: 29 mEq/L (ref 19–32)
CREATININE: 0.66 mg/dL (ref 0.40–1.20)
Chloride: 103 mEq/L (ref 96–112)
GFR: 125 mL/min (ref >60.00)
GLUCOSE: 90 mg/dL (ref 70–99)
Potassium: 3.3 mEq/L — ABNORMAL LOW (ref 3.5–5.1)
Sodium: 138 mEq/L (ref 135–145)

## 2017-02-13 LAB — PULMONARY FUNCTION TEST
DL/VA % pred: 80 %
DL/VA: 4.45 ml/min/mmHg/L
DLCO COR: 15.02 ml/min/mmHg
DLCO UNC % PRED: 47 %
DLCO UNC: 16.35 ml/min/mmHg
DLCO cor % pred: 43 %
FEF 25-75 Pre: 2.21 L/sec
FEF2575-%Pred-Pre: 68 %
FEV1-%PRED-PRE: 61 %
FEV1-Pre: 1.94 L
FEV1FVC-%PRED-PRE: 100 %
FEV6-%Pred-Pre: 60 %
FEV6-Pre: 2.3 L
FEV6FVC-%PRED-PRE: 101 %
FVC-%Pred-Pre: 59 %
FVC-Pre: 2.32 L
PRE FEV1/FVC RATIO: 84 %
Pre FEV6/FVC Ratio: 99 %

## 2017-02-13 MED ORDER — DOXYCYCLINE HYCLATE 100 MG PO TABS: 100.0000 mg | ORAL_TABLET | Freq: Two times a day (BID) | ORAL | 0 refills | Status: DC

## 2017-02-13 MED ORDER — PREDNISONE 10 MG PO TABS: ORAL_TABLET | ORAL | 0 refills | Status: DC

## 2017-02-13 NOTE — Progress Notes (Signed)
PFT done today. 

## 2017-02-13 NOTE — Progress Notes (Signed)
Subjective:     Patient ID: Shannon [name], female   DOB: [birth year], 46 y.o.   MRN: [id number]  HPI  PCP [name],[name], PA-C Referred by Dr [name]  HPI   IOV 08/23/2014  Chief Complaint  Patient presents with  . Pulmonary Consult    Pt referred by Dr. Corliss Skains for ILD. Pt c/o SOB with activity and rest, dry cough and chest tightness also with and without activity.    46 year old female referred for evaluation of autoimmune interstitial lung disease. She presents with her husband. In 2010 while living in [place] she reports she was diagnosed with lupus associated with rheumatoid arthritis in her joints. In 2012 she moved to live in [facility] and several months after that started noticing insidious onset of shortness of breath. Local rheumatologist diagnosed her with interstitial lung disease. She was referred to Dr. Herma Carson at [facility] in [facility] and was started on CellCept/prednisone for autoimmune interstitial lung disease he and however, sometime around 2 years ago she ran out of medical insurance and stopped taking these medications. During this time her dyspnea has progressed. It is currently rated as moderate to severe. It is present on exertion and relieved by rest. Even minimal amount of exertion makes her very dyspneic. Now she has insurance and she did see Dr. Frederik Pear locally and she has autoimmune panel lab ordered. In addition exam revealed crackles and there for she's been referred here for reevaluation of interstitial lung disease and dyspnea. Dyspnea is associated with some chest tightness but no chest pain. This no associated dizzine   11/24/2014 Follow UP ILD  [name]  She has autoimmune ILD with RA/Lupus  She was seen 6 weeks ago, restarted on Cellcept.  Previously on cellcept but lost her insurance until recently.  On low dose prednisone 5mg  daily .  Last CT chest 4/4 showed ILD changes similar to 2013. Echo  was ok with EFG 55-60%, nml PAP . In March .   Did not take dapsone ,due to  GP6D deficiency.  Labs ok last week with nml LFT , no sign change in hbg. /wbc.  She is feeling better. Does feel her breathing is some better.  No flare of cough or wheezing.     OV 01/16/2015  Chief Complaint  Patient presents with  . Follow-up    Pt c/o DOE, mild dry cough, and chest tightness when SOB. Pt states the chest tightness has improved).    follow-up interstitial lung disease in the setting of rheumatoid arthritis Follow-up high risk medication use - on CellCept and prednisone since mid April 2016  - Presents with her husband. Both give a history. Overall she is doing better in terms of dyspnea after starting CellCept and prednisone. However the improvement this only moderate. She still left with a residual moderate dyspnea on exertion that is also made worse with bending or heart air and improved with rest and cool air. It is associated with some cough and wheezing. She takes albuterol inhaler which she feels helps only somewhat but not fully and not quickly enough. She is frustrated by this. In addition she's complaining of some associated right lower back paraspinal spasm for which massage gives her relief. He has never attended pulmonary rehabilitation.  - Lab review shows she has had problems with potassium and has had potassium supplementation. Last lab check was 12/08/2014. She is due for lab test right now   OV 03/28/2015  Chief Complaint  Patient presents with  . Follow-up    3 month follow up. Pt states that she is still having some problems with her breathing. Pt c/o of feeling chest tightness, chest pain and cough that is dry. Pt denies wheeze. Pt states that she did trial the Advair and does feel that it helped some.   Follow-up interstitial lung disease secondary to autoimmune process and associated dyspnea that seems out of proportion\  She presents with her husband. She continues  on CellCept and prednisone. In the last visit approximately 2-3 months ago she had out of proportion dyspnea. I gave her some Advair to try. She says this only helped a little bit. Overall she says that dyspnea still persists. It is worse than in the spring 2016 when she started CellCept and prednisone. It is stable since July 2016. It is moderate in intensity. Occurs randomly at rest but also with exertion. Occasionally relieved by rest but also happens at rest. Heat and humidity make it worse. Advair helped a little bit only. This no associated chest tightness or wheezing. She did try and enroll  in pulmonary rehabilitation but could not afford the the startup program and is waiting to hear from them for the maintenance program. She is frustrated by all the symptoms.  Pulmonary function test October 15,016 today FVC 2.55 L/64%, total lung capacity 4 L/65% and diffusion 14.5/42%. Overall no change since April 2016   OV 07/04/2015  Chief Complaint  Patient presents with  . Follow-up    pt. states breathing is baseline. SOB. dry cough. wheezing. occ. chest pain/tightness. feels her airway is blocked.   Follow-up interstitial lung disease admitted autoimmune processes and associated out of proportion dyspnea  She presents again with her husband. She continues on CellCept 2 g twice daily associated with prednisone. She cannot take Bactrim or dapsone due to G6PD deficiency. Last seen in the fall of 2016. She was having out of proportion dyspnea. Rated cardia pulmonary stress test and she could not tolerate this test. She then underwent right heart catheterization mid-November 2016 with Dr. Marca Ancona. Review the tests this is normal. Overall stable but she and husband still continued to complain about this resting dyspnea associated with wheezing. They hear noises in the chest. Sometime she gasps for air even at rest. She feels it comes from the chest but the husband points to the throat. I offered  second opinion at [facility] because of this unusual symptoms but they declined citing distance. She was supposed to attend pulmonary rehabilitation but they cannot afford a $60 co-pay twice a week 8 weeks. Offered ENT evaluation that agreeable but they wanted done in  which is closer logistically. In addition patient is contemplating now applying for disability. 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  Walking desaturation test 185 feet 3 laps and rheumatic: No desaturation    OV 10/12/2015  Chief Complaint  Patient presents with  . Follow-up    PFT today.  breathing is better.  Pt is currently on STD, [employer] approved STD until today, wants to know today if pt is able to return to work, any restrictions.  Pt states that she has been more stable while out of work.      Follow-up interstitial lung disease due to autoimmune processes not otherwise specified  Last seen January 2017. Since then she has been on short-term disability and out of work. She does heavy manual work. The lack of work estimated  dyspnea better. She did pulmonary function test today that I personally visualized and overall it is same. There are no new issues. She is on CellCept and she is tolerating this fine. Last visit she had some anemia we follow this up and the anemia was better. Also hypokalemia resolved by February 2017. She is due for blood work today. 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 This showed up at last visit. She saw an ENT in [place] within referred her to [facility]. She does not want to go to [facility]. She ended up seeing Dr. Ezzard Standing locally. But now she is going to see Dr.  Dina Rich [facility]   Pulmonary function test today 10/12/2015 shows postbronchodilator FVC 2.65 L/67%. FEV1 2.34 L/72% which is up 17% positive bronchodilator response. Ratio of 80/106%. Total  lung capacity of 3.8/62%. DLCO 18.36/52%. Overall consistent with restriction and lung parenchymal disease. Overall PFTs are stable compared to April 2016 but perhaps in DLCO slightly better   OV 04/15/2016  Chief Complaint  Patient presents with  . Interstitial Lung Disease    Breathing is unchanged since last OV. Reports SOB, coughing. Cough is non productive. Denies chest tightness or wheezing.   Follow-up interstitial lung disease due to autoimmune processes not otherwise specified. On CellCept and prednisone. Not on Bactrim prophylaxis due to G6PD deficiency  Last visit April 2017. At that time based on pulmonary function tests showed stability and ILD for a year. 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 She did see local ENT doctor [place] and apparently has been reassured. At this point in time she is applying for long-term disability at work but the work discharged from services and she is now applying for Social Security disability. Her dyspnea stable since the interim. She's also had hysterectomy but for the last few weeks her cough is worse and it is dry. This no fever. Is no weight loss or chills. She says she's compliant with her CellCept and prednisone.  OV 05/29/2016  Chief Complaint  Patient presents with  . Follow-up    Pt states she still has harsh dry cough, pt states her SOB is unchanged and chest tightness when SOB or when coughing a lot. Pt deneis f/c/s.     Follow-up interstitial lung disease due to autoimmune processes not otherwise specified. On CellCept and prednisone since April 2016. Not on Bactrim prophylaxis due to G6PD deficiency  Kynzli returns for follow-up. She had high resolution CT chest that shows stability and interstitial lung disease since April 2016. She Pulmicort function test that shows mild improvement since April 2016. Therefore it appears that her CellCept and prednisone is helping her she'll lung disease related  to collagen-vascular disease. However she tells me that she continues to have chronic cough for the last few to several months. It is progressive. It is dry. It is worse than her baseline. The dyspnea is unchanged. This no fever or weight loss or chills   OV 07/24/2016  Chief Complaint  Patient presents with  . Follow-up    Pt states she feels the Arnuity has been helping her breathing. Pt c/o dry cough and occ chest tightness.   rec    ICD-9-CM ICD-10-CM   1. ILD (interstitial lung disease) (HCC) 515 J84.9   2. Chronic cough 786.2 R05   3. High risk medication use V58.69 Z79.899  Clinically improved with cellcept/prednisone and arnuity Blood work ok dec 2017  pLAN - start pulm rehab at [facility] - continue  aruity daily - continue cellcept and prednisone  Followup - 6 months do Pre-bd spiro and dlco only. No lung volume or bd response. No post-bd spiro - 6 months fu Dr Marchelle Gearing after above or sooner if needed     12/15/2016  f/u ov/Wert re: cough dry on arnuity  Chief Complaint  Patient presents with  . Follow-up    Breathing is unchanged since her last visit. Pt states she is here to f/u on recent ABG result. She c/o feeling tired all of the time. She has occ cough- non prod.       Finished at Rehab   beginning  Of May  2018 and trouble walking fast or uphill Waking up with ha's since rehab sev days a week  Cough is new x one month mostly dry and day > noct   No obvious day to day or daytime variability or assoc excess/ purulent sputum or mucus plugs or hemoptysis or cp or chest tightness, subjective wheeze or overt sinus or hb symptoms. No unusual exp hx or h/o childhood pna/ asthma or knowledge of premature birth.   OV 02/13/2017  Chief Complaint  Patient presents with  . Follow-up    cough/ILD follow up - prod cough with brownish/green mucus with tightness and chest pain x2 days.  denies any f/c/s, hemoptysis.  PFT done today.    Follow-up interstitial lung  disease due to autoimmune processes not otherwise specified. On CellCept and prednisone since April 2016. Not on Bactrim prophylaxis due to G6PD deficiency  46 year old female immunosuppressed with CellCept and prednisone. Not seen her in many months. Currently taking prednisone 3 mg per day and CellCept 1000 mg twice daily. She cannot do Bactrim prophylaxis because of G6PD deficiency. She tells me that overall her health has been stable but in the last few weeks noticing increased shortness of breath in the last few days this increased cough and change in color of sputum to green and increased chest tightness and a feeling that she is getting acute bronchitis. She also was contemplated going to the emergency room a few days ago but now she is better. There is no obvious fever or chills or hemoptysis or edema paroxysmal nocturnal dyspnea or orthopnea. Pulmonary function test today shows 10% decline in FVC and DLCO compared to baseline.   Results for ONA, ROEHRS (MRN [id number]) as of 02/13/2017 12:16  Ref. Range 10/09/2014 09:39 03/28/2015 11:40 10/12/2015 13:42 05/06/2016 10:38 02/13/2017 10:49  FVC-Pre Latest Units: L 2.54 2.55 2.53 2.63 2.32  FVC-%Pred-Pre Latest Units: % 64 64 64 67 59   Results for JAMILLIA, CLOSSON (MRN [id number]) as of 02/13/2017 12:16  Ref. Range 10/09/2014 09:39 03/28/2015 11:40 10/12/2015 13:42 05/06/2016 10:38 02/13/2017 10:49  DLCO unc Latest Units: ml/min/mmHg 15.03 14.50 18.36 18.31 16.35  DLCO unc % pred Latest Units: % 43 42 53 53 47     has a past medical history of Anemia; G6PD deficiency (HCC); ILD (interstitial lung disease) (HCC); Lupus; and Rheumatoid arthritis (HCC).   reports that she quit smoking about 2 years ago. Her smoking use included Cigarettes. She has a 7.00 pack-year smoking history. She has never used smokeless tobacco.  Past Surgical History:  Procedure Laterality Date  . APPENDECTOMY  1980  . CARDIAC CATHETERIZATION N/A 05/10/2015   Procedure:  Right Heart Cath;  Surgeon: Laurey Morale, MD;  Location: [facility] INVASIVE [facility];  Service: Cardiovascular;  Laterality: N/A;  . CESAREAN SECTION  '95, '02, '07   X 3  . CHOLECYSTECTOMY  2010  . LAPAROSCOPIC HYSTERECTOMY  10/2015   have ovaries  . TUBAL LIGATION      Allergies  Allergen Reactions  . Asa [Aspirin]     HIves  . Penicillins Hives  . Sulfa Antibiotics     G6PD deficiency     Immunization History  Administered Date(s) Administered  . Influenza Split 03/23/2014  . Influenza,inj,Quad PF,6+ Mos 03/15/2016  . Pneumococcal-Unspecified 08/23/2014    Family History  Problem Relation Age of Onset  . Sarcoidosis Mother   . Lupus Sister      Current Outpatient Prescriptions:  .  albuterol (PROVENTIL HFA;VENTOLIN HFA) 108 (90 Base) MCG/ACT inhaler, Inhale 2 puffs into the lungs every 6 (six) hours as needed for wheezing or shortness of breath., Disp: 1 Inhaler, Rfl: 5 .  budesonide-formoterol (SYMBICORT) 80-4.5 MCG/ACT inhaler, Inhale 2 puffs into the lungs 2 (two) times daily., Disp: 1 Inhaler, Rfl: 11 .  Cholecalciferol (VITAMIN D3) 50000 units CAPS, Take 50,000 Units by mouth once a week., Disp: 12 capsule, Rfl: 0 .  diclofenac sodium (VOLTAREN) 1 % GEL, Apply 2 g topically 4 (four) times daily., Disp: , Rfl:  .  ferrous sulfate 325 (65 FE) MG tablet, Take by mouth., Disp: , Rfl:  .  gabapentin (NEURONTIN) 300 MG capsule, Take 300 mg by mouth 3 (three) times daily., Disp: , Rfl:  .  ibuprofen (ADVIL,MOTRIN) 200 MG tablet, Take 200 mg by mouth every 6 (six) hours as needed for moderate pain. , Disp: , Rfl:  .  mycophenolate (CELLCEPT) 500 MG tablet, Take 2 tablets (1,000 mg total) by mouth 2 (two) times daily., Disp: 360 tablet, Rfl: 1 .  oxyCODONE-acetaminophen (PERCOCET/ROXICET) 5-325 MG tablet, Take 1 tablet by mouth 2 (two) times daily as needed for severe pain., Disp: , Rfl:  .  predniSONE (DELTASONE) 1 MG tablet, Take 3 tablets (3 mg total) by mouth daily with  breakfast., Disp: 270 tablet, Rfl: 1 .  oxyCODONE (OXY IR/ROXICODONE) 5 MG immediate release tablet, Take 5 mg by mouth 2 (two) times daily as needed. , Disp: , Rfl:    Review of Systems     Objective:   Physical Exam Vitals:   02/13/17 1149  BP: 138/88  Pulse: 74  SpO2: 99%  Weight: 230 lb (104.3 kg)  Height: 5' 11.5" (1.816 m)     Estimated body mass index is 31.63 kg/m as calculated from the following:   Height as of this encounter: 5' 11.5" (1.816 m).   Weight as of this encounter: 230 lb (104.3 kg).     Assessment:       ICD-10-CM   1. Acute bronchitis, unspecified organism J20.9   2. ILD (interstitial lung disease) (HCC) J84.9   3. High risk medication use Z79.899    Acute bronchitis with concerned that he might be having worsening interstitial lung disease because of the bronchitis    Plan:       Do cxr 2 view 02/13/2017 Do cbc, bmet, lft 02/13/2017  Take doxycycline 100mg  po twice daily x 5 days; take after meals and avoid sunlight Take prednisone 40 mg daily x 2 days, then 20mg  daily x 2 days, then 10mg  daily x 2 days, then 5mg  daily x 2 days and back to baseline 3mg  daily dose Continue cellcept 1000mg  twice daily  Followup In 3 months  do Pre-bd spiro and dlco only. No lung volume or bd response. No post-bd spiro REturn to see Dr Marchelle Gearing in 3 months or sooner if needed    Dr. Kalman Shan, M.D., [facility].C.P Pulmonary and Critical Care Medicine Staff Physician [facility] Pager: [phone], If no answer or between  15:00h - 7:00h: call [phone]  02/13/2017 12:26 PM

## 2017-02-13 NOTE — Addendum Note (Signed)
Addended by: Wyvonne Lenz on: 02/13/2017 12:34 PM   Modules accepted: Orders

## 2017-02-13 NOTE — Patient Instructions (Addendum)
ICD-10-CM   1. Acute bronchitis, unspecified organism J20.9   2. ILD (interstitial lung disease) (HCC) J84.9   3. High risk medication use Z79.899     Do cxr 2 view 02/13/2017 Do cbc, bmet, lft 02/13/2017  Take doxycycline 100mg  po twice daily x 5 days; take after meals and avoid sunlight Take prednisone 40 mg daily x 2 days, then 20mg  daily x 2 days, then 10mg  daily x 2 days, then 5mg  daily x 2 days and back to baseline 3mg  daily dose Continue cellcept 1000mg  twice daily  Followup In 3 months do Pre-bd spiro and dlco only. No lung volume or bd response. No post-bd spiro REturn to see Dr in 3 months or sooner if needed

## 2017-02-17 ENCOUNTER — Telehealth: Payer: Self-pay | Admitting: Internal Medicine

## 2017-02-17 NOTE — Telephone Encounter (Signed)
  Labs ok except  1. WBC low normal -butt is baseline for her 2. K - is low. She need to tke kcl daily 3. ROV per plan   Dr. Kalman Shan, M.D., [facility].C.P Pulmonary and Critical Care Medicine Staff Physician [facility] [place] [facility] Pager: [phone], If no answer or between  15:00h - 7:00h: call [phone]  02/17/2017 2:23 AM    PULMONARY No results for input(s): PHART, PCO2ART, PO2ART, HCO3, TCO2, O2SAT in the last 168 hours.  Invalid input(s): PCO2, PO2  CBC  Recent Labs Lab 02/13/17 1252  HGB 11.6*  HCT 35.9*  WBC 3.7*  PLT 198.0    COAGULATION No results for input(s): INR in the last 168 hours.  CARDIAC  No results for input(s): TROPONINI in the last 168 hours. No results for input(s): PROBNP in the last 168 hours.   CHEMISTRY  Recent Labs Lab 02/13/17 1252  NA 138  K 3.3*  CL 103  CO2 29  GLUCOSE 90  BUN 11  CREATININE 0.66  CALCIUM 9.1   Estimated Creatinine Clearance: 120.3 mL/min (by C-G formula based on SCr of 0.66 mg/dL).   LIVER  Recent Labs Lab 02/13/17 1252  AST 11  ALT 7  ALKPHOS 57  BILITOT 0.4  PROT 7.7  ALBUMIN 3.8     INFECTIOUS No results for input(s): LATICACIDVEN, PROCALCITON in the last 168 hours.   ENDOCRINE CBG (last 3)  No results for input(s): GLUCAP in the last 72 hours.       IMAGING x48h  - image(s) personally visualized  -   highlighted in bold No results found.

## 2017-02-20 MED ORDER — POTASSIUM CHLORIDE ER 20 MEQ PO TBCR: 20.0000 meq | EXTENDED_RELEASE_TABLET | Freq: Every day | ORAL | 2 refills | Status: DC

## 2017-02-20 NOTE — Telephone Encounter (Signed)
Called and spoke to pt. Informed her of the results and recs per MR. Rx sent to preferred pharmacy. Pt verbalized understanding and denied any further questions or concerns at this time.

## 2017-03-23 NOTE — Progress Notes (Deleted)
Office Visit Note  Patient: Shannon [name]             Date of Birth: Jun 18, 1971           MRN: [id number]             PCP: Eunice Blase, PA-C Referring: Eunice Blase, PA-C Visit Date: 03/30/2017 Occupation: @GUAROCC @    Subjective:  No chief complaint on file.   History of Present Illness: Shannon [name] is a 46 y.o. female ***   Activities of Daily Living:  Patient reports morning stiffness for *** {minute/hour:19697}.   Patient {ACTIONS;DENIES/REPORTS:21021675::"Denies"} nocturnal pain.  Difficulty dressing/grooming: {ACTIONS;DENIES/REPORTS:21021675::"Denies"} Difficulty climbing stairs: {ACTIONS;DENIES/REPORTS:21021675::"Denies"} Difficulty getting out of chair: {ACTIONS;DENIES/REPORTS:21021675::"Denies"} Difficulty using hands for taps, buttons, cutlery, and/or writing: {ACTIONS;DENIES/REPORTS:21021675::"Denies"}   No Rheumatology ROS completed.   PMFS History:  Patient Active Problem List   Diagnosis Date Noted  . Acute bronchitis 02/13/2017  . Cough variant asthma  vs uacs  12/17/2016  . Morbid obesity due to excess calories (HCC) 12/17/2016  . Contracture of left elbow 11/21/2016  . Primary osteoarthritis of both knees 11/21/2016  . Pain in right hand 09/09/2016  . Pain in left hand 09/09/2016  . History of osteoarthritis 09/09/2016  . Rheumatoid factor positive 08/01/2016  . High risk medication use 01/16/2015  . G6PD deficiency (HCC) 10/31/2014  . Dyspnea 08/23/2014  . ILD (interstitial lung disease) (HCC) 08/23/2014  . Other organ or system involvement in systemic lupus erythematosus (HCC) 08/23/2014    Past Medical History:  Diagnosis Date  . Anemia   . G6PD deficiency (HCC)   . ILD (interstitial lung disease) (HCC)   . Lupus   . Rheumatoid arthritis (HCC)     Family History  Problem Relation Age of Onset  . Sarcoidosis Mother   . Lupus Sister    Past Surgical History:  Procedure Laterality Date  . APPENDECTOMY  1980  . CARDIAC  CATHETERIZATION N/A 05/10/2015   Procedure: Right Heart Cath;  Surgeon: 05/12/2015, MD;  Location: [facility] INVASIVE CV LAB;  Service: Cardiovascular;  Laterality: N/A;  . CESAREAN SECTION  '95, '02, '07   X 3  . CHOLECYSTECTOMY  2010  . LAPAROSCOPIC HYSTERECTOMY  10/2015   have ovaries  . TUBAL LIGATION     Social History   Social History Narrative   Married, lives with husband, children   Caffeine- coffee  1 daily     Objective: Vital Signs: LMP 04/28/2015    Physical Exam   Musculoskeletal Exam: ***  CDAI Exam: No CDAI exam completed.    Investigation: No additional findings. CBC Latest Ref Rng & Units 02/13/2017 11/27/2016 09/09/2016  WBC 4.0 - 10.5 K/uL 3.7(L) 4.1 4.0  Hemoglobin 12.0 - 15.0 g/dL 11.6(L) 11.7 11.7  Hematocrit 36.0 - 46.0 % 35.9(L) 36.0 36.3  Platelets 150.0 - 400.0 K/uL 198.0 238 232   CMP Latest Ref Rng & Units 02/13/2017 11/27/2016 09/09/2016  Glucose 70 - 99 mg/dL 90 76 88  BUN 6 - 23 mg/dL 11 11 9   Creatinine 0.40 - 1.20 mg/dL 09/11/2016 4.09  Sodium 135 - 145 mEq/L 138 140 137  Potassium 3.5 - 5.1 mEq/L 3.3(L) 3.6 4.1  Chloride 96 - 112 mEq/L 103 104 105  CO2 19 - 32 mEq/L 29 17(L) 23  Calcium 8.4 - 10.5 mg/dL 9.1 9.0 9.0  Total Protein 6.0 - 8.3 g/dL 7.7 8.11) 7.6  Total Bilirubin 0.2 - 1.2 mg/dL 0.4 0.5 0.3  Alkaline Phos 39 -  117 U/L 57 77 65  AST 0 - 37 U/L 11 18 16   ALT 0 - 35 U/L 7 14 11    Imaging: No results found.  Speciality Comments: No specialty comments available.    Procedures:  No procedures performed Allergies: Asa [aspirin]; Penicillins; and Sulfa antibiotics   Assessment / Plan:     Visit Diagnoses: Other organ or system involvement in systemic lupus erythematosus (HCC) - +  ANA1:1280, Smith, RNP, rheumatoid factor 65, history of arthritis with knee joint effusion, oral ulcers, Raynauds, anemia, neutropenia, interstitial lung dx.  ILD (interstitial lung disease) (HCC)  High risk medication use  Contracture of  bilateral elbows  Primary osteoarthritis of both knees  Rheumatoid factor positive  G6PD deficiency (HCC)  Primary insomnia  History of cholecystectomy    Orders: No orders of the defined types were placed in this encounter.  No orders of the defined types were placed in this encounter.   Face-to-face time spent with patient was *** minutes. 50% of time was spent in counseling and coordination of care.  Follow-Up Instructions: No Follow-up on file.   Siona Coulston, RT  Note - This record has been created using .  Chart creation errors have been sought, but may not always  have been located. Such creation errors do not reflect on  the standard of medical care.

## 2017-03-30 ENCOUNTER — Ambulatory Visit: Payer: 59 | Admitting: Rheumatology

## 2017-04-23 NOTE — Progress Notes (Deleted)
Office Visit Note  Patient: Shannon [name]             Date of Birth: 12/05/1970           MRN: [id number]             PCP: Eunice Blase, PA-C Referring: Eunice Blase, PA-C Visit Date: 05/05/2017 Occupation: @GUAROCC @    Subjective:  No chief complaint on file.   History of Present Illness: Shannon [name] is a 46 y.o. female ***   Activities of Daily Living:  Patient reports morning stiffness for *** {minute/hour:19697}.   Patient {ACTIONS;DENIES/REPORTS:21021675::"Denies"} nocturnal pain.  Difficulty dressing/grooming: {ACTIONS;DENIES/REPORTS:21021675::"Denies"} Difficulty climbing stairs: {ACTIONS;DENIES/REPORTS:21021675::"Denies"} Difficulty getting out of chair: {ACTIONS;DENIES/REPORTS:21021675::"Denies"} Difficulty using hands for taps, buttons, cutlery, and/or writing: {ACTIONS;DENIES/REPORTS:21021675::"Denies"}   No Rheumatology ROS completed.   PMFS History:  Patient Active Problem List   Diagnosis Date Noted  . Acute bronchitis 02/13/2017  . Cough variant asthma  vs uacs  12/17/2016  . Morbid obesity due to excess calories (HCC) 12/17/2016  . Contracture of left elbow 11/21/2016  . Primary osteoarthritis of both knees 11/21/2016  . Pain in right hand 09/09/2016  . Pain in left hand 09/09/2016  . History of osteoarthritis 09/09/2016  . Rheumatoid factor positive 08/01/2016  . High risk medication use 01/16/2015  . G6PD deficiency (HCC) 10/31/2014  . Dyspnea 08/23/2014  . ILD (interstitial lung disease) (HCC) 08/23/2014  . Other organ or system involvement in systemic lupus erythematosus (HCC) 08/23/2014    Past Medical History:  Diagnosis Date  . Anemia   . G6PD deficiency (HCC)   . ILD (interstitial lung disease) (HCC)   . Lupus   . Rheumatoid arthritis (HCC)     Family History  Problem Relation Age of Onset  . Sarcoidosis Mother   . Lupus Sister    Past Surgical History:  Procedure Laterality Date  . APPENDECTOMY  1980  . CARDIAC  CATHETERIZATION N/A 05/10/2015   Procedure: Right Heart Cath;  Surgeon: 05/12/2015, MD;  Location: [facility] INVASIVE CV LAB;  Service: Cardiovascular;  Laterality: N/A;  . CESAREAN SECTION  '95, '02, '07   X 3  . CHOLECYSTECTOMY  2010  . LAPAROSCOPIC HYSTERECTOMY  10/2015   have ovaries  . TUBAL LIGATION     Social History   Social History Narrative   Married, lives with husband, children   Caffeine- coffee  1 daily     Objective: Vital Signs: LMP 04/28/2015    Physical Exam   Musculoskeletal Exam: ***  CDAI Exam: No CDAI exam completed.    Investigation: No additional findings. CBC Latest Ref Rng & Units 02/13/2017 11/27/2016 09/09/2016  WBC 4.0 - 10.5 K/uL 3.7(L) 4.1 4.0  Hemoglobin 12.0 - 15.0 g/dL 11.6(L) 11.7 11.7  Hematocrit 36.0 - 46.0 % 35.9(L) 36.0 36.3  Platelets 150.0 - 400.0 K/uL 198.0 238 232   CMP Latest Ref Rng & Units 02/13/2017 11/27/2016 09/09/2016  Glucose 70 - 99 mg/dL 90 76 88  BUN 6 - 23 mg/dL 11 11 9   Creatinine 0.40 - 1.20 mg/dL 09/11/2016 4.31  Sodium 135 - 145 mEq/L 138 140 137  Potassium 3.5 - 5.1 mEq/L 3.3(L) 3.6 4.1  Chloride 96 - 112 mEq/L 103 104 105  CO2 19 - 32 mEq/L 29 17(L) 23  Calcium 8.4 - 10.5 mg/dL 9.1 9.0 9.0  Total Protein 6.0 - 8.3 g/dL 7.7 5.40) 7.6  Total Bilirubin 0.2 - 1.2 mg/dL 0.4 0.5 0.3  Alkaline Phos 39 -  117 U/L 57 77 65  AST 0 - 37 U/L 11 18 16   ALT 0 - 35 U/L 7 14 11     Imaging: No results found.  Speciality Comments: No specialty comments available.    Procedures:  No procedures performed Allergies: Asa [aspirin]; Penicillins; and Sulfa antibiotics   Assessment / Plan:     Visit Diagnoses: No diagnosis found.    Orders: No orders of the defined types were placed in this encounter.  No orders of the defined types were placed in this encounter.   Face-to-face time spent with patient was *** minutes. 50% of time was spent in counseling and coordination of care.  Follow-Up Instructions: No Follow-up on  file.   , CMA  Note - This record has been created using .  Chart creation errors have been sought, but may not always  have been located. Such creation errors do not reflect on  the standard of medical care.

## 2017-05-05 ENCOUNTER — Ambulatory Visit: Payer: Self-pay | Admitting: Rheumatology

## 2017-05-12 ENCOUNTER — Encounter (HOSPITAL_COMMUNITY): Payer: Self-pay | Admitting: *Deleted

## 2017-05-12 NOTE — Progress Notes (Signed)
I have made multiple calls to [facility] to discuss the Outpt. Pulmonary  Maintenance program. I have left messages and I have not had any return calls from her. I will no longer try to reach her. I did leave her a message that there is also a Pulmonary Rehab program in [place].

## 2017-05-20 ENCOUNTER — Ambulatory Visit: Payer: 59 | Admitting: Internal Medicine

## 2017-06-08 ENCOUNTER — Other Ambulatory Visit (INDEPENDENT_AMBULATORY_CARE_PROVIDER_SITE_OTHER): Payer: BLUE CROSS/BLUE SHIELD

## 2017-06-08 ENCOUNTER — Ambulatory Visit (INDEPENDENT_AMBULATORY_CARE_PROVIDER_SITE_OTHER): Payer: BLUE CROSS/BLUE SHIELD | Admitting: Internal Medicine

## 2017-06-08 ENCOUNTER — Ambulatory Visit: Payer: BLUE CROSS/BLUE SHIELD | Admitting: Internal Medicine

## 2017-06-08 ENCOUNTER — Encounter: Payer: Self-pay | Admitting: Internal Medicine

## 2017-06-08 VITALS — BP 118/78 | HR 44 | Ht 71.5 in | Wt 228.0 lb

## 2017-06-08 DIAGNOSIS — J849 Interstitial pulmonary disease, unspecified: Secondary | ICD-10-CM

## 2017-06-08 DIAGNOSIS — Z23 Encounter for immunization: Secondary | ICD-10-CM

## 2017-06-08 DIAGNOSIS — Z79899 Other long term (current) drug therapy: Secondary | ICD-10-CM

## 2017-06-08 LAB — CBC WITH DIFFERENTIAL/PLATELET
BASOS ABS: 0 10*3/uL (ref 0.0–0.1)
BASOS PCT: 0.8 % (ref 0.0–3.0)
EOS ABS: 0.2 10*3/uL (ref 0.0–0.7)
Eosinophils Relative: 4.7 % (ref 0.0–5.0)
HEMATOCRIT: 36.1 % (ref 36.0–46.0)
Hemoglobin: 11.7 g/dL — ABNORMAL LOW (ref 12.0–15.0)
LYMPHS ABS: 1.5 10*3/uL (ref 0.7–4.0)
LYMPHS PCT: 36.9 % (ref 12.0–46.0)
MCHC: 32.3 g/dL (ref 30.0–36.0)
MCV: 93.6 fl (ref 78.0–100.0)
MONOS PCT: 7.1 % (ref 3.0–12.0)
Monocytes Absolute: 0.3 10*3/uL (ref 0.1–1.0)
NEUTROS ABS: 2 10*3/uL (ref 1.4–7.7)
NEUTROS PCT: 50.5 % (ref 43.0–77.0)
PLATELETS: 185 10*3/uL (ref 150.0–400.0)
RBC: 3.86 Mil/uL — ABNORMAL LOW (ref 3.87–5.11)
RDW: 13.1 % (ref 11.5–15.5)
WBC: 4 10*3/uL (ref 4.0–10.5)

## 2017-06-08 LAB — PULMONARY FUNCTION TEST
DL/VA % PRED: 78 %
DL/VA: 4.35 ml/min/mmHg/L
DLCO COR % PRED: 43 %
DLCO COR: 14.89 ml/min/mmHg
DLCO UNC % PRED: 41 %
DLCO unc: 14.21 ml/min/mmHg
FEF 25-75 PRE: 1.95 L/s
FEF2575-%Pred-Pre: 60 %
FEV1-%Pred-Pre: 59 %
FEV1-Pre: 1.87 L
FEV1FVC-%PRED-PRE: 97 %
FEV6-%Pred-Pre: 60 %
FEV6-Pre: 2.3 L
FEV6FVC-%Pred-Pre: 101 %
FVC-%PRED-PRE: 59 %
FVC-Pre: 2.31 L
PRE FEV6/FVC RATIO: 100 %
Pre FEV1/FVC ratio: 81 %

## 2017-06-08 LAB — BASIC METABOLIC PANEL
BUN: 13 mg/dL (ref 6–23)
CALCIUM: 8.9 mg/dL (ref 8.4–10.5)
CHLORIDE: 105 meq/L (ref 96–112)
CO2: 27 meq/L (ref 19–32)
CREATININE: 0.66 mg/dL (ref 0.40–1.20)
GFR: 124.82 mL/min (ref >60.00)
GLUCOSE: 85 mg/dL (ref 70–99)
Potassium: 3.5 mEq/L (ref 3.5–5.1)
Sodium: 139 mEq/L (ref 135–145)

## 2017-06-08 LAB — HEPATIC FUNCTION PANEL
ALK PHOS: 58 U/L (ref 39–117)
ALT: 7 U/L (ref 0–35)
AST: 11 U/L (ref 0–37)
Albumin: 3.9 g/dL (ref 3.5–5.2)
BILIRUBIN TOTAL: 0.3 mg/dL (ref 0.2–1.2)
Bilirubin, Direct: 0.1 mg/dL (ref 0.0–0.3)
Total Protein: 7.8 g/dL (ref 6.0–8.3)

## 2017-06-08 MED ORDER — MYCOPHENOLATE MOFETIL 500 MG PO TABS: 1000.0000 mg | ORAL_TABLET | Freq: Two times a day (BID) | ORAL | 1 refills | Status: DC

## 2017-06-08 NOTE — Patient Instructions (Signed)
ICD-10-CM   1. ILD (interstitial lung disease) (HCC) J84.9   2. High risk medication use Z79.899    Do cbc, bmet, lft 06/08/2017  Do HRCT next several days to few weeks Continuie cellcept and prednisone as before Return to followup to discuss   - HRCT results and to decide bronchoscopy with Lavage versus not versus right heart cath repeat or echo

## 2017-06-08 NOTE — Progress Notes (Signed)
PFT done today. 

## 2017-06-08 NOTE — Progress Notes (Signed)
Subjective:     Patient ID: Shannon [name], female   DOB: 1971/05/27, 46 y.o.   MRN: [id number]  HPI  PCP [name],[name], PA-C Referred by Dr [name]  HPI   IOV 08/23/2014 [name]    Pt referred by Dr. Corliss Skains for ILD. Pt c/o SOB with activity and rest, dry cough and chest tightness also with and without activity.    46 year old female referred for evaluation of autoimmune interstitial lung disease. She presents with her husband. In 2010 while living in [place] she reports she was diagnosed with lupus associated with rheumatoid arthritis in her joints. In 2012 she moved to live in [facility] and several months after that started noticing insidious onset of shortness of breath. Local rheumatologist diagnosed her with interstitial lung disease. She was referred to Dr. Herma Carson at [facility] in [facility] and was started on CellCept/prednisone for autoimmune interstitial lung disease he and however, sometime around 2 years ago she ran out of medical insurance and stopped taking these medications. During this time her dyspnea has progressed. It is currently rated as moderate to severe. It is present on exertion and relieved by rest. Even minimal amount of exertion makes her very ddyspneic. Now she has insurance and she did see Dr. Frederik Pear locally and she has autoimmune panel lab ordered. In addition exam revealed crackles and there for she's been referred here for reevaluation of interstitial lung disease and dyspnea. Dyspnea is associated with some chest tightness but no chest pain. This no associated dizzine   11/24/2014 Follow UP ILD  [name]  She has autoimmune ILD with RA/Lupus  She was seen 6 weeks ago, restarted on Cellcept.  Previously on cellcept but lost her insurance until recently.  On low dose prednisone 5mg  daily .   Last CT chest 4/4 showed ILD changes similar to 2013. Echo was ok with EFG 55-60%, nml PAP . In March .   Did not take dapsone ,due to  GP6D deficiency.  Labs ok last week with nml LFT , no sign change in hbg. /wbc.  She is feeling better. Does feel her breathing is some better.  No flare of cough or wheezing.     OV 01/16/2015  Chief Complaint  Patient presents with  . Follow-up    Pt c/o DOE, mild dry cough, and chest tightness when SOB. Pt states the chest tightness has improved).    follow-up interstitial lung disease in the setting of rheumatoid arthritis Follow-up high risk medication use - on CellCept and prednisone since mid April 2016  - Presents with her husband. Both give a history. Overall she is doing better in terms of dyspnea after starting CellCept and prednisone. However the improvement this only moderate. She still left with a residual moderate dyspnea on exertion that is also made worse with bending or heart air and improved with rest and cool air. It is associated with some cough and wheezing. She takes albuterol inhaler which she feels helps only somewhat but not fully and not quickly enough. She is frustrated by this. In addition she's complaining of some associated right lower back paraspinal spasm for which massage gives her relief. He has never attended pulmonary rehabilitation.  - Lab review shows she has had problems with potassium and has had potassium supplementation. Last lab check was  12/08/2014. She is due for lab test right now   OV 03/28/2015   Chief Complaint  Patient presents with  . Follow-up    3 month follow up. Pt states that she is still having some problems with her breathing. Pt c/o of feeling chest tightness, chest pain and cough that is dry. Pt denies wheeze. Pt states that she did trial the Advair and does feel that it helped some.   Follow-up interstitial lung disease secondary to autoimmune process and associated dyspnea that seems out  of proportion\  She presents with her husband. She continues on CellCept and prednisone. In the last visit approximately 2-3 months ago she had out of proportion dyspnea. I gave her some Advair to try. She says this only helped a little bit. Overall she says that dyspnea still persists. It is worse than in the spring 2016 when she started CellCept and prednisone. It is stable since July 2016. It is moderate in intensity. Occurs randomly at rest but also with exertion. Occasionally relieved by rest but also happens at rest. Heat and humidity make it worse. Advair helped a little bit only. This no associated chest tightness or wheezing. She did try and enroll  in pulmonary rehabilitation but could not afford the the startup program and is waiting to hear from them for the maintenance program. She is frustrated by all the symptoms.  Pulmonary function test October 15,016 today FVC 2.55 L/64%, total lung capacity 4 L/65% and diffusion 14.5/42%. Overall no change since April 2016   OV 07/04/2015  Chief Complaint  Patient presents with  . Follow-up    pt. states breathing is baseline. SOB. dry cough. wheezing. occ. chest pain/tightness. feels her airway is blocked.   Follow-up interstitial lung disease admitted autoimmune processes and associated out of proportion dyspnea  She presents again with her husband. She continues on CellCept 2 g twice daily associated with prednisone. She cannot take Bactrim or dapsone due to G6PD deficiency. Last seen in the fall of 2016. She was having out of proportion dyspnea. Rated cardia pulmonary stress test and she could not tolerate this test. She then underwent right heart catheterization mid-November 2016 with Dr. Marca Ancona. Review the tests this is normal. Overall stable but she and husband still continued to complain about this resting dyspnea associated with wheezing. They hear noises in the chest. Sometime she gasps for air even at rest. She feels it comes from  the chest but the husband points to the throat. I offered second opinion at [facility] because of this unusual symptoms but they declined citing distance. She was supposed to attend pulmonary rehabilitation but they cannot afford a $60 co-pay twice a week 8 weeks. Offered ENT evaluation that agreeable but they wanted done in [place] which is closer logistically. In addition patient is contemplating now applying for disability. 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  Walking desaturation test 185 feet 3 laps and rheumatic: No desaturation    OV 10/12/2015  Chief Complaint  Patient presents with  . Follow-up    PFT today.  breathing is better.  Pt is currently on STD, [employer] approved STD until today, wants to know today if pt is able to return to work, any restrictions.  Pt states that she has been more stable while out of work.      Follow-up interstitial lung disease due to autoimmune processes not otherwise specified  Last seen January 2017. Since then she has been on short-term  disability and out of work. She does heavy manual work. The lack of work estimated dyspnea better. She did pulmonary function test today that I personally visualized and overall it is same. There are no new issues. She is on CellCept and she is tolerating this fine. Last visit she had some anemia we follow this up and the anemia was better. Also hypokalemia resolved by February 2017. She is due for blood work today. 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 This showed up at last visit. She saw an ENT in [place] within referred her to [facility]. She does not want to go to [facility]. She ended up seeing Dr. Ezzard Standing locally. But now she is going to see Dr.  Dina Rich [facility]   Pulmonary function test today 10/12/2015 shows postbronchodilator FVC 2.65 L/67%. FEV1 2.34 L/72% which is up 17%  positive bronchodilator response. Ratio of 80/106%. Total lung capacity of 3.8/62%. DLCO 18.36/52%. Overall consistent with restriction and lung parenchymal disease. Overall PFTs are stable compared to April 2016 but perhaps in DLCO slightly better   OV 04/15/2016  Chief Complaint  Patient presents with  . Interstitial Lung Disease    Breathing is unchanged since last OV. Reports SOB, coughing. Cough is non productive. Denies chest tightness or wheezing.   Follow-up interstitial lung disease due to autoimmune processes not otherwise specified. On CellCept and prednisone. Not on Bactrim prophylaxis due to G6PD deficiency  Last visit April 2017. At that time based on pulmonary function tests showed stability and ILD for a year. 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 She did see local ENT doctor [place] and apparently has been reassured. At this point in time she is applying for long-term disability at work but the work discharged from services and she is now applying for Social Security disability. Her dyspnea stable since the interim. She's also had hysterectomy but for the last few weeks her cough is worse and it is dry. This no fever. Is no weight loss or chills. She says she's compliant with her CellCept and prednisone.  OV 05/29/2016  Chief Complaint  Patient presents with  . Follow-up    Pt states she still has harsh dry cough, pt states her SOB is unchanged and chest tightness when SOB or when coughing a lot. Pt deneis f/c/s.     Follow-up interstitial lung disease due to autoimmune processes not otherwise specified. On CellCept and prednisone since April 2016. Not on Bactrim prophylaxis due to G6PD deficiency  Shannon [name] returns for follow-up. She had high resolution CT chest that shows stability and interstitial lung disease since April 2016. She Pulmicort function test that shows mild improvement since April 2016. Therefore it appears that her CellCept  and prednisone is helping her she'll lung disease related to collagen-vascular disease. However she tells me that she continues to have chronic cough for the last few to several months. It is progressive. It is dry. It is worse than her baseline. The dyspnea is unchanged. This no fever or weight loss or chills   OV 07/24/2016  Chief Complaint  Patient presents with  . Follow-up    Pt states she feels the Arnuity has been helping her breathing. Pt c/o dry cough and occ chest tightness.   rec    ICD-9-CM ICD-10-CM   1. ILD (interstitial lung disease) (HCC) 515 J84.9  2. Chronic cough 786.2 R05   3. High risk medication use V58.69 Z79.899    Clinically improved with cellcept/prednisone and arnuity Blood work ok dec 2017  pLAN - start pulm rehab at [facility] - continue  aruity daily - continue cellcept and prednisone  Followup - 6 months do Pre-bd spiro and dlco only. No lung volume or bd response. No post-bd spiro - 6 months fu Dr Marchelle Gearing after above or sooner if needed     12/15/2016  f/u ov/Wert re: cough dry on arnuity  Chief Complaint  Patient presents with  . Follow-up    Breathing is unchanged since her last visit. Pt states she is here to f/u on recent ABG result. She c/o feeling tired all of the time. She has occ cough- non prod.       Finished at Rehab   beginning  Of May  2018 and trouble walking fast or uphill Waking up with ha's since rehab sev days a week  Cough is new x one month mostly dry and day > noct   No obvious day to day or daytime variability or assoc excess/ purulent sputum or mucus plugs or hemoptysis or cp or chest tightness, subjective wheeze or overt sinus or hb symptoms. No unusual exp hx or h/o childhood pna/ asthma or knowledge of premature birth.   OV 02/13/2017  Chief Complaint  Patient presents with  . Follow-up    cough/ILD follow up - prod cough with brownish/green mucus with tightness and chest pain x2 days.  denies any f/c/s,  hemoptysis.  PFT done today.    Follow-up interstitial lung disease due to autoimmune processes not otherwise specified. On CellCept and prednisone since April 2016. Not on Bactrim prophylaxis due to G6PD deficiency  46 year old female immunosuppressed with CellCept and prednisone. Not seen her in many months. Currently taking prednisone 3 mg per day and CellCept 1000 mg twice daily. She cannot do Bactrim prophylaxis because of G6PD deficiency. She tells me that overall her health has been stable but in the last few weeks noticing increased shortness of breath in the last few days this increased cough and change in color of sputum to green and increased chest tightness and a feeling that she is getting acute bronchitis. She also was contemplated going to the emergency room a few days ago but now she is better. There is no obvious fever or chills or hemoptysis or edema paroxysmal nocturnal dyspnea or orthopnea. Pulmonary function test today shows 10% decline in FVC and DLCO compared to baseline.   OV 06/08/2017  Chief Complaint  Patient presents with  . Follow-up    Feeling about the same as last visit. Still having chest tightness and wheezing at times, Sounds hoarse.     Follow-up interstitial lung disease due to autoimmune processes not otherwise specified. On CellCept and prednisone since April 2016. Not on Bactrim prophylaxis due to G6PD deficiency. Normal Right heart cath Nov 2016.  Last high-resolution CT November 2017  Last visit August 2018.  There is a routine follow-up.  Overall she feels stable.  FVC shows stability since August 2018 but declined since 1 year ago.  DLCO shows continued decline.  Though her lung health is stable.  She says she lost her 1 only biological sister last week due to lupus.  The sister was only 4 and lived in [place].  There are no new issues. Walking desaturation test on 06/08/2017 185 feet x 3 laps on ROOM AIR:  did not  desaturate. Rest pulse ox was  100%%, final pulse ox was 100%. HR response 81/min at rest to 109/min at peak exertion. Patient Shannon [name]  Did not Desaturate < 88% . Shannon [name] did nto  Desaturated </= 3% points. Shannon [name] yes did get tachyardic        Results for AVO, SCHLACHTER (MRN [id number]) as of 02/13/2017 12:16  Ref. Range 10/09/2014 09:39 03/28/2015 11:40 10/12/2015 13:42 05/06/2016 10:38 02/13/2017 10:49 06/08/2017   FVC-Pre Latest Units: L 2.54 2.55 2.53 2.63 2.32 2.31  FVC-%Pred-Pre Latest Units: % 64 64 64 67 59 59%   Results for Shannon [name], Shannon [name] (MRN [id number]) as of 02/13/2017 12:16  Ref. Range 10/09/2014 09:39 03/28/2015 11:40 10/12/2015 13:42 05/06/2016 10:38 02/13/2017 10:49 06/08/2017   DLCO unc Latest Units: ml/min/mmHg 15.03 14.50 18.36 18.31 16.35 14.21  DLCO unc % pred Latest Units: % 43 42 53 53 47 41      has a past medical history of Anemia, G6PD deficiency (HCC), ILD (interstitial lung disease) (HCC), Lupus, and Rheumatoid arthritis (HCC).   reports that she quit smoking about 2 years ago. Her smoking use included cigarettes. She has a 7.00 pack-year smoking history. she has never used smokeless tobacco.  Past Surgical History:  Procedure Laterality Date  . APPENDECTOMY  1980  . CARDIAC CATHETERIZATION N/A 05/10/2015   Procedure: Right Heart Cath;  Surgeon: Laurey Morale, MD;  Location: [facility] INVASIVE CV LAB;  Service: Cardiovascular;  Laterality: N/A;  . CESAREAN SECTION  '95, '02, '07   X 3  . CHOLECYSTECTOMY  2010  . LAPAROSCOPIC HYSTERECTOMY  10/2015   have ovaries  . TUBAL LIGATION      Allergies  Allergen Reactions  . Asa [Aspirin]     HIves  . Penicillins Hives  . Sulfa Antibiotics     G6PD deficiency     Immunization History  Administered Date(s) Administered  . Influenza Split 03/23/2014  . Influenza,inj,Quad PF,6+ Mos 03/15/2016  . Pneumococcal-Unspecified 08/23/2014    Family History  Problem Relation Age of Onset  . Sarcoidosis Mother   . Lupus Sister       Current Outpatient Medications:  .  albuterol (PROVENTIL HFA;VENTOLIN HFA) 108 (90 Base) MCG/ACT inhaler, Inhale 2 puffs into the lungs every 6 (six) hours as needed for wheezing or shortness of breath., Disp: 1 Inhaler, Rfl: 5 .  budesonide-formoterol (SYMBICORT) 80-4.5 MCG/ACT inhaler, Inhale 2 puffs into the lungs 2 (two) times daily., Disp: 1 Inhaler, Rfl: 11 .  diclofenac sodium (VOLTAREN) 1 % GEL, Apply 2 g topically 4 (four) times daily as needed. , Disp: , Rfl:  .  ferrous sulfate 325 (65 FE) MG tablet, Take by mouth., Disp: , Rfl:  .  gabapentin (NEURONTIN) 300 MG capsule, Take 300 mg by mouth 3 (three) times daily., Disp: , Rfl:  .  ibuprofen (ADVIL,MOTRIN) 200 MG tablet, Take 200 mg by mouth every 6 (six) hours as needed for moderate pain. , Disp: , Rfl:  .  mycophenolate (CELLCEPT) 500 MG tablet, Take 2 tablets (1,000 mg total) by mouth 2 (two) times daily., Disp: 360 tablet, Rfl: 1 .  oxyCODONE (OXY IR/ROXICODONE) 5 MG immediate release tablet, Take 5 mg by mouth 2 (two) times daily as needed. , Disp: , Rfl:  .  Potassium Chloride ER 20 MEQ TBCR, Take 20 mEq by mouth daily., Disp: 30 tablet, Rfl: 2 .  predniSONE (DELTASONE) 10 MG tablet, Take 40mg  for 2 days, 20mg  for 2 days,  10mg  for 2 days, 5 mg for 2 days (Patient taking differently: Take 3 mg by mouth daily with breakfast. Take 40mg  for 2 days, 20mg  for 2 days, 10mg  for 2 days, 5 mg for 2 days), Disp: 15 tablet, Rfl: 0   Review of Systems     Objective:   Physical Exam  Constitutional: She is oriented to person, place, and time. She appears well-developed and well-nourished. No distress.  HENT:  Head: Normocephalic and atraumatic.  Right Ear: External ear normal.  Left Ear: External ear normal.  Mouth/Throat: Oropharynx is clear and moist. No oropharyngeal exudate.  Eyes: Conjunctivae and EOM are normal. Pupils are equal, round, and reactive to light. Right eye exhibits no discharge. Left eye exhibits no discharge. No  scleral icterus.  Neck: Normal range of motion. Neck supple. No JVD present. No tracheal deviation present. No thyromegaly present.  Cardiovascular: Normal rate, regular rhythm, normal heart sounds and intact distal pulses. Exam reveals no gallop and no friction rub.  No murmur heard. Pulmonary/Chest: Effort normal. No respiratory distress. She has no wheezes. She has rales. She exhibits no tenderness.  Abdominal: Soft. Bowel sounds are normal. She exhibits no distension and no mass. There is no tenderness. There is no rebound and no guarding.  Musculoskeletal: Normal range of motion. She exhibits no edema or tenderness.  Lymphadenopathy:    She has no cervical adenopathy.  Neurological: She is alert and oriented to person, place, and time. She has normal reflexes. No cranial nerve deficit. She exhibits normal muscle tone. Coordination normal.  Skin: Skin is warm and dry. No rash noted. She is not diaphoretic. No erythema. No pallor.  Psychiatric: She has a normal mood and affect. Her behavior is normal. Judgment and thought content normal.  Vitals reviewed.  Vitals:   06/08/17 1558 06/08/17 1601  BP: 118/78   Pulse:  (!) 44  SpO2:  100%  Weight: 228 lb (103.4 kg)   Height: 5' 11.5" (1.816 m)     Estimated body mass index is 31.36 kg/m as calculated from the following:   Height as of this encounter: 5' 11.5" (1.816 m).   Weight as of this encounter: 228 lb (103.4 kg).     Assessment:       ICD-10-CM   1. ILD (interstitial lung disease) (HCC) J84.9   2. High risk medication use Z79.899        Plan:      Do cbc, bmet, lft 06/08/2017  Do HRCT next several days to few weeks Continuie cellcept and prednisone as before Return to followup to discuss   - HRCT results and to decide bronchoscopy with Lavage versus not versus right heart cath repeat or echo   Dr. Kalman ShanMurali Aylani Spurlock, M.D., [facility].C.P Pulmonary and Critical Care Medicine Staff Physician, [facility]  Director - Interstitial Lung Disease  Program  Pulmonary Fibrosis [facility] [place], [place], [id number]  Pager: [phone], If no answer or between  15:00h - 7:00h: call [phone] Telephone: [phone]

## 2017-06-22 ENCOUNTER — Ambulatory Visit (INDEPENDENT_AMBULATORY_CARE_PROVIDER_SITE_OTHER)
Admission: RE | Admit: 2017-06-22 | Discharge: 2017-06-22 | Disposition: A | Discharge status: Home / Self Care | Payer: BLUE CROSS/BLUE SHIELD | Source: Ambulatory Visit | Attending: Internal Medicine | Admitting: Internal Medicine

## 2017-06-22 DIAGNOSIS — J849 Interstitial pulmonary disease, unspecified: Secondary | ICD-10-CM

## 2017-07-01 ENCOUNTER — Telehealth: Payer: Self-pay | Admitting: Internal Medicine

## 2017-07-01 DIAGNOSIS — R0602 Shortness of breath: Secondary | ICD-10-CM

## 2017-07-01 NOTE — Telephone Encounter (Signed)
Let Shannon [name] know that CT chest is unchange din a year. So fibrosis not worse.  Plan  hav her do a 2D echo for eval of Pulm Art pressures and return to see me for followup  Dr. Kalman Shan, M.D., [facility].C.P Pulmonary and Critical Care Medicine Staff Physician, [facility] Director - Interstitial Lung Disease  Program  Pulmonary Fibrosis [facility] at [facility] [place], [place], [address]  Pager: [phone], If no answer or between  15:00h - 7:00h: call [phone] Telephone: [phone]

## 2017-07-01 NOTE — Telephone Encounter (Signed)
lmtcb X1 for pt.  Will order echo and rov after speaking to patient.

## 2017-07-01 NOTE — Telephone Encounter (Signed)
Spoke with pt and notified of results per Dr. Marchelle Gearing. Pt verbalized understanding and denied any questions. Order for ECHO placed

## 2017-07-01 NOTE — Telephone Encounter (Signed)
Pt returning call. Cb is [phone].

## 2017-07-15 ENCOUNTER — Other Ambulatory Visit: Payer: Self-pay

## 2017-07-15 ENCOUNTER — Ambulatory Visit (HOSPITAL_COMMUNITY): Payer: BLUE CROSS/BLUE SHIELD | Attending: Internal Medicine

## 2017-07-15 DIAGNOSIS — J849 Interstitial pulmonary disease, unspecified: Secondary | ICD-10-CM | POA: Diagnosis not present

## 2017-07-15 DIAGNOSIS — R0602 Shortness of breath: Secondary | ICD-10-CM | POA: Insufficient documentation

## 2017-07-15 DIAGNOSIS — M329 Systemic lupus erythematosus, unspecified: Secondary | ICD-10-CM | POA: Diagnosis not present

## 2017-07-17 ENCOUNTER — Telehealth: Payer: Self-pay | Admitting: Internal Medicine

## 2017-07-17 NOTE — Telephone Encounter (Signed)
Shannon [name] :  Please tell Shannon [name] that echo shows sliught elevation in pulm artery pressure and she might need another right heart cath. So, please get her an appt with Dr Shannon [name] . Also, give her an appt to see me in 3 months from 07/17/2017

## 2017-07-20 NOTE — Telephone Encounter (Signed)
Pt is returning call from 1/25. Cb is [phone].

## 2017-07-22 MED ORDER — MYCOPHENOLATE MOFETIL 500 MG PO TABS: 1000.0000 mg | ORAL_TABLET | Freq: Two times a day (BID) | ORAL | 1 refills | Status: DC

## 2017-07-22 NOTE — Telephone Encounter (Signed)
Called pt letting her know the results from the echo she had done and also told her to call and get an appt scheduled with Dr. Shirlee Latch.  Gave pt Dr. Alford Highland phone number for her to call and get an appt scheduled.  While on the phone, pt stated that the Cellcept would not be covered at [facility] that the Rx needed to be sent to [facility].  Rx sent to [facility] for pt. Also scheduled pt a 22-month follow up visit from 07/17/17.  Nothing further needed at this current time.

## 2017-08-05 ENCOUNTER — Other Ambulatory Visit (HOSPITAL_COMMUNITY): Payer: Self-pay

## 2017-08-05 ENCOUNTER — Ambulatory Visit (HOSPITAL_COMMUNITY)
Admission: RE | Admit: 2017-08-05 | Discharge: 2017-08-05 | Disposition: A | Discharge status: Home / Self Care | Payer: BLUE CROSS/BLUE SHIELD | Source: Ambulatory Visit | Attending: Cardiology | Admitting: Cardiology

## 2017-08-05 ENCOUNTER — Encounter (HOSPITAL_COMMUNITY): Payer: Self-pay | Admitting: Cardiology

## 2017-08-05 ENCOUNTER — Encounter (HOSPITAL_COMMUNITY): Payer: Self-pay

## 2017-08-05 VITALS — BP 141/97 | HR 55 | Ht 71.0 in | Wt 231.0 lb

## 2017-08-05 DIAGNOSIS — Z79899 Other long term (current) drug therapy: Secondary | ICD-10-CM | POA: Diagnosis not present

## 2017-08-05 DIAGNOSIS — E785 Hyperlipidemia, unspecified: Secondary | ICD-10-CM | POA: Diagnosis not present

## 2017-08-05 DIAGNOSIS — I10 Essential (primary) hypertension: Secondary | ICD-10-CM | POA: Diagnosis not present

## 2017-08-05 DIAGNOSIS — M069 Rheumatoid arthritis, unspecified: Secondary | ICD-10-CM | POA: Diagnosis not present

## 2017-08-05 DIAGNOSIS — I272 Pulmonary hypertension, unspecified: Secondary | ICD-10-CM | POA: Diagnosis not present

## 2017-08-05 DIAGNOSIS — R0789 Other chest pain: Secondary | ICD-10-CM | POA: Diagnosis not present

## 2017-08-05 DIAGNOSIS — Z87891 Personal history of nicotine dependence: Secondary | ICD-10-CM | POA: Insufficient documentation

## 2017-08-05 DIAGNOSIS — Z9889 Other specified postprocedural states: Secondary | ICD-10-CM | POA: Diagnosis not present

## 2017-08-05 DIAGNOSIS — Z8249 Family history of ischemic heart disease and other diseases of the circulatory system: Secondary | ICD-10-CM | POA: Insufficient documentation

## 2017-08-05 DIAGNOSIS — M329 Systemic lupus erythematosus, unspecified: Secondary | ICD-10-CM | POA: Insufficient documentation

## 2017-08-05 LAB — LIPID PANEL
Cholesterol: 127 mg/dL (ref 0–200)
HDL: 39 mg/dL — ABNORMAL LOW (ref >40)
LDL CALC: 71 mg/dL (ref 0–99)
Total CHOL/HDL Ratio: 3.3 RATIO
Triglycerides: 86 mg/dL (ref <150)
VLDL: 17 mg/dL (ref 0–40)

## 2017-08-05 LAB — CBC
HEMATOCRIT: 38.1 % (ref 36.0–46.0)
Hemoglobin: 12.3 g/dL (ref 12.0–15.0)
MCH: 30.1 pg (ref 26.0–34.0)
MCHC: 32.3 g/dL (ref 30.0–36.0)
MCV: 93.4 fL (ref 78.0–100.0)
Platelets: 212 10*3/uL (ref 150–400)
RBC: 4.08 MIL/uL (ref 3.87–5.11)
RDW: 12.8 % (ref 11.5–15.5)
WBC: 3.2 10*3/uL — AB (ref 4.0–10.5)

## 2017-08-05 LAB — BASIC METABOLIC PANEL
ANION GAP: 10 (ref 5–15)
BUN: 12 mg/dL (ref 6–20)
CHLORIDE: 105 mmol/L (ref 101–111)
CO2: 24 mmol/L (ref 22–32)
Calcium: 9 mg/dL (ref 8.9–10.3)
Creatinine, Ser: 0.75 mg/dL (ref 0.44–1.00)
Glucose, Bld: 80 mg/dL (ref 65–99)
POTASSIUM: 3.5 mmol/L (ref 3.5–5.1)
SODIUM: 139 mmol/L (ref 135–145)

## 2017-08-05 MED ORDER — AMLODIPINE BESYLATE 5 MG PO TABS: 5.0000 mg | ORAL_TABLET | Freq: Every day | ORAL | 3 refills | Status: DC

## 2017-08-05 NOTE — Patient Instructions (Signed)
Start Amlodipine 5 mg (1 tab) daily  Labs drawn today (if we do not call you, then your lab work was stable)   Your physician has requested that you have a cardiac catheterization. Cardiac catheterization is used to diagnose and/or treat various heart conditions. Doctors may recommend this procedure for a number of different reasons. The most common reason is to evaluate chest pain. Chest pain can be a symptom of coronary artery disease (CAD), and cardiac catheterization can show whether plaque is narrowing or blocking your heart's arteries. This procedure is also used to evaluate the valves, as well as measure the blood flow and oxygen levels in different parts of your heart. For further information please visit [email or web address] Please follow instruction sheet, as given.

## 2017-08-05 NOTE — Progress Notes (Signed)
Pulmonology: Dr. Marchelle Gearing Cardiology: Dr. Shirlee Latch  47 yo with history of SLE, RA and autoimmune interstitial lung disease was referred by Dr. Marchelle Gearing for evaluation of pulmonary hypertension.  Patient is currently on prednisone and Cellcept. Symptoms have been progressive recently. She now notes dyspnea after walking 1.5-2 blocks.  No orthopnea/PND. No dyspnea with ADLs.  She gets occasional episodes of chest tightness, no definite trigger.  12/18 high resolution CT chest showed ILD but did not suggest significant progression.  Echo showed normal LV EF, normal RV size and systolic function, and borderline elevated PA pressure estimation (35 mmHg).  She had RHC in 11/16 with no pulmonary hypertension.  However, with progression of symptoms in absence of worsening CT chest, there is again concern for development of pulmonary hypertension.    ECG: NSR, iRBBB, PVCs  Labs (12/18): K 3.5, creatinine 0.66  PMH:  1. SLE 2. Rheumatoid arthritis 3. Autoimmune interstitial lung disease: Related to SLE and RA.   - High resolution CT chest (12/18): Interstitial lung disease without progression from prior study.  - PFTs (12/18): FEV1 59%, FVC 59%, ratio 97%, DLCO 41%.  4. G6PD deficiency 5. HTN 6. RHC (11/16): mean RA 4, PA 21/10, mean PCWP 6, CI 4.25 7. Echo (1/19): EF 55-60%, moderate diastolic dysfunction, PA systolic pressure 35 mmHg.   Social History   Socioeconomic History  . Marital status: Married    Spouse name: [name]  . Number of children: 3  . Years of education: 58  . Highest education level: Not on file  Social Needs  . Financial resource strain: Not on file  . Food insecurity - worry: Not on file  . Food insecurity - inability: Not on file  . Transportation needs - medical: Not on file  . Transportation needs - non-medical: Not on file  Occupational History  . 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 Smoking status:  Former Smoker    Packs/day: 0.50    Years: 14.00    Pack years: 7.00    Types: Cigarettes    Last attempt to quit: 09/25/2014    Years since quitting: 2.8  . Smokeless tobacco: Never Used  Substance and Sexual Activity  . Alcohol use: No    Alcohol/week: 0.0 oz  . Drug use: No  . Sexual activity: Not on file  Other Topics Concern  . Not on file  Social History Narrative   Married, lives with husband, children   Caffeine- coffee  1 daily   FH: Sister with SLE, died due to what sounds like a cardiac arrhythmia.  Mother with sarcoidosis.   ROS: All systems reviewed and negative except as per HPI.  Current Outpatient Medications  Medication Sig Dispense Refill  . albuterol (PROVENTIL HFA;VENTOLIN HFA) 108 (90 Base) MCG/ACT inhaler Inhale 2 puffs into the lungs every 6 (six) hours as needed for wheezing or shortness of breath. 1 Inhaler 5  . budesonide-formoterol (SYMBICORT) 80-4.5 MCG/ACT inhaler Inhale 2 puffs into the lungs 2 (two) times daily. 1 Inhaler 11  . diclofenac sodium (VOLTAREN) 1 % GEL Apply 2 g topically 4 (four) times daily as needed (pain).     . ferrous sulfate 325 (65 FE) MG tablet Take 325 mg by mouth daily with breakfast.     . gabapentin (NEURONTIN) 300 MG capsule Take 300 mg by mouth 3 (three) times daily.    Marland Kitchen ibuprofen (ADVIL,MOTRIN) 200 MG tablet Take 600 mg  by mouth every 6 (six) hours as needed for moderate pain.     . mycophenolate (CELLCEPT) 500 MG tablet Take 2 tablets (1,000 mg total) by mouth 2 (two) times daily. 360 tablet 1  . oxyCODONE (OXY IR/ROXICODONE) 5 MG immediate release tablet Take 5 mg by mouth 2 (two) times daily as needed.     . Potassium Chloride ER 20 MEQ TBCR Take 20 mEq by mouth daily. (Patient not taking: Reported on 08/05/2017) 30 tablet 2  . predniSONE (DELTASONE) 10 MG tablet Take 40mg  for 2 days, 20mg  for 2 days, 10mg  for 2 days, 5 mg for 2 days (Patient not taking: Reported on 08/05/2017) 15 tablet 0  . amLODipine (NORVASC) 5 MG  tablet Take 1 tablet (5 mg total) by mouth daily. 30 tablet 3  . predniSONE (DELTASONE) 1 MG tablet Take 3 mg by mouth daily with breakfast.     No current facility-administered medications for this encounter.    BP (!) 141/97 (BP Location: Right Arm, Patient Position: Sitting, Cuff Size: Normal)   Pulse (!) 55   Ht 5\' 11"  (1.803 m)   Wt 231 lb (104.8 kg)   LMP 04/28/2015   SpO2 100%   BMI 32.22 kg/m  General: NAD Neck: No JVD, no thyromegaly or thyroid nodule.  Lungs: Clear to auscultation bilaterally with normal respiratory effort. CV: Nondisplaced PMI.  Heart regular S1/S2, no S3/S4, no murmur.  No peripheral edema.  No carotid bruit.  Normal pedal pulses.  Abdomen: Soft, nontender, no hepatosplenomegaly, no distention.  Skin: Intact without lesions or rashes.  Neurologic: Alert and oriented x 3.  Psych: Normal affect. Extremities: No clubbing or cyanosis.  HEENT: Normal.   Assessment/Plan: 1. Pulmonary hypertension: NYHA class II-III symptoms.  With worsening dyspnea in the absence of significant change in ILD pattern on chest CT, development of group 1 pulmonary hypertension is a consideration.  SLE and RA do put her at risk for group 1 PH.  Echo was somewhat equivocal: RV looked ok and estimated PA systolic pressure is borderline high.  She had a right heart cath back in 2016 without PH.  - Given worsening symptoms without definite cause, I think RHC to assess for PH in this situation would be appropriate.  We discussed risks/benefits of procedure and she agrees to RHC.   2. HTN: BP has been running high for 5-6 months per patient.  I will start her on amlodipine 5 mg daily.  3. Chest pain: Atypical, suspect noncardiac.  I looked at the recent chest CT, no coronary calcium noted.   - Check lipids.   08/07/2017 08/05/2017

## 2017-08-05 NOTE — H&P (View-Only) (Signed)
Pulmonology: Dr. Marchelle Gearing Cardiology: Dr. Shirlee Latch  47 yo with history of SLE, RA and autoimmune interstitial lung disease was referred by Dr. Marchelle Gearing for evaluation of pulmonary hypertension.  Patient is currently on prednisone and Cellcept. Symptoms have been progressive recently. She now notes dyspnea after walking 1.5-2 blocks.  No orthopnea/PND. No dyspnea with ADLs.  She gets occasional episodes of chest tightness, no definite trigger.  12/18 high resolution CT chest showed ILD but did not suggest significant progression.  Echo showed normal LV EF, normal RV size and systolic function, and borderline elevated PA pressure estimation (35 mmHg).  She had RHC in 11/16 with no pulmonary hypertension.  However, with progression of symptoms in absence of worsening CT chest, there is again concern for development of pulmonary hypertension.    ECG: NSR, iRBBB, PVCs  Labs (12/18): K 3.5, creatinine 0.66  PMH:  1. SLE 2. Rheumatoid arthritis 3. Autoimmune interstitial lung disease: Related to SLE and RA.   - High resolution CT chest (12/18): Interstitial lung disease without progression from prior study.  - PFTs (12/18): FEV1 59%, FVC 59%, ratio 97%, DLCO 41%.  4. G6PD deficiency 5. HTN 6. RHC (11/16): mean RA 4, PA 21/10, mean PCWP 6, CI 4.25 7. Echo (1/19): EF 55-60%, moderate diastolic dysfunction, PA systolic pressure 35 mmHg.   Social History   Socioeconomic History  . Marital status: Married    Spouse name: [name]  . Number of children: 3  . Years of education: 58  . Highest education level: Not on file  Social Needs  . Financial resource strain: Not on file  . Food insecurity - worry: Not on file  . Food insecurity - inability: Not on file  . Transportation needs - medical: Not on file  . Transportation needs - non-medical: Not on file  Occupational History  . 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 Smoking status:  Former Smoker    Packs/day: 0.50    Years: 14.00    Pack years: 7.00    Types: Cigarettes    Last attempt to quit: 09/25/2014    Years since quitting: 2.8  . Smokeless tobacco: Never Used  Substance and Sexual Activity  . Alcohol use: No    Alcohol/week: 0.0 oz  . Drug use: No  . Sexual activity: Not on file  Other Topics Concern  . Not on file  Social History Narrative   Married, lives with husband, children   Caffeine- coffee  1 daily   FH: Sister with SLE, died due to what sounds like a cardiac arrhythmia.  Mother with sarcoidosis.   ROS: All systems reviewed and negative except as per HPI.  Current Outpatient Medications  Medication Sig Dispense Refill  . albuterol (PROVENTIL HFA;VENTOLIN HFA) 108 (90 Base) MCG/ACT inhaler Inhale 2 puffs into the lungs every 6 (six) hours as needed for wheezing or shortness of breath. 1 Inhaler 5  . budesonide-formoterol (SYMBICORT) 80-4.5 MCG/ACT inhaler Inhale 2 puffs into the lungs 2 (two) times daily. 1 Inhaler 11  . diclofenac sodium (VOLTAREN) 1 % GEL Apply 2 g topically 4 (four) times daily as needed (pain).     . ferrous sulfate 325 (65 FE) MG tablet Take 325 mg by mouth daily with breakfast.     . gabapentin (NEURONTIN) 300 MG capsule Take 300 mg by mouth 3 (three) times daily.    Marland Kitchen ibuprofen (ADVIL,MOTRIN) 200 MG tablet Take 600 mg  by mouth every 6 (six) hours as needed for moderate pain.     . mycophenolate (CELLCEPT) 500 MG tablet Take 2 tablets (1,000 mg total) by mouth 2 (two) times daily. 360 tablet 1  . oxyCODONE (OXY IR/ROXICODONE) 5 MG immediate release tablet Take 5 mg by mouth 2 (two) times daily as needed.     . Potassium Chloride ER 20 MEQ TBCR Take 20 mEq by mouth daily. (Patient not taking: Reported on 08/05/2017) 30 tablet 2  . predniSONE (DELTASONE) 10 MG tablet Take 40mg for 2 days, 20mg for 2 days, 10mg for 2 days, 5 mg for 2 days (Patient not taking: Reported on 08/05/2017) 15 tablet 0  . amLODipine (NORVASC) 5 MG  tablet Take 1 tablet (5 mg total) by mouth daily. 30 tablet 3  . predniSONE (DELTASONE) 1 MG tablet Take 3 mg by mouth daily with breakfast.     No current facility-administered medications for this encounter.    BP (!) 141/97 (BP Location: Right Arm, Patient Position: Sitting, Cuff Size: Normal)   Pulse (!) 55   Ht 5' 11" (1.803 m)   Wt 231 lb (104.8 kg)   LMP 04/28/2015   SpO2 100%   BMI 32.22 kg/m  General: NAD Neck: No JVD, no thyromegaly or thyroid nodule.  Lungs: Clear to auscultation bilaterally with normal respiratory effort. CV: Nondisplaced PMI.  Heart regular S1/S2, no S3/S4, no murmur.  No peripheral edema.  No carotid bruit.  Normal pedal pulses.  Abdomen: Soft, nontender, no hepatosplenomegaly, no distention.  Skin: Intact without lesions or rashes.  Neurologic: Alert and oriented x 3.  Psych: Normal affect. Extremities: No clubbing or cyanosis.  HEENT: Normal.   Assessment/Plan: 1. Pulmonary hypertension: NYHA class II-III symptoms.  With worsening dyspnea in the absence of significant change in ILD pattern on chest CT, development of group 1 pulmonary hypertension is a consideration.  SLE and RA do put her at risk for group 1 PH.  Echo was somewhat equivocal: RV looked ok and estimated PA systolic pressure is borderline high.  She had a right heart cath back in 2016 without PH.  - Given worsening symptoms without definite cause, I think RHC to assess for PH in this situation would be appropriate.  We discussed risks/benefits of procedure and she agrees to RHC.   2. HTN: BP has been running high for 5-6 months per patient.  I will start her on amlodipine 5 mg daily.  3. Chest pain: Atypical, suspect noncardiac.  I looked at the recent chest CT, no coronary calcium noted.   - Check lipids.   Cyrena Kuchenbecker 08/05/2017  

## 2017-08-10 ENCOUNTER — Encounter (HOSPITAL_COMMUNITY): Payer: Self-pay

## 2017-08-20 ENCOUNTER — Ambulatory Visit (HOSPITAL_COMMUNITY)
Admission: RE | Disposition: A | Discharge status: Home / Self Care | Payer: Self-pay | Source: Ambulatory Visit | Attending: Cardiology

## 2017-08-20 ENCOUNTER — Ambulatory Visit (HOSPITAL_COMMUNITY)
Admission: RE | Admit: 2017-08-20 | Discharge: 2017-08-20 | Disposition: A | Discharge status: Home / Self Care | Payer: BLUE CROSS/BLUE SHIELD | Source: Ambulatory Visit | Attending: Cardiology | Admitting: Cardiology

## 2017-08-20 DIAGNOSIS — R0789 Other chest pain: Secondary | ICD-10-CM | POA: Diagnosis not present

## 2017-08-20 DIAGNOSIS — I272 Pulmonary hypertension, unspecified: Secondary | ICD-10-CM | POA: Diagnosis present

## 2017-08-20 DIAGNOSIS — Z87891 Personal history of nicotine dependence: Secondary | ICD-10-CM | POA: Diagnosis not present

## 2017-08-20 DIAGNOSIS — R0600 Dyspnea, unspecified: Secondary | ICD-10-CM | POA: Diagnosis not present

## 2017-08-20 DIAGNOSIS — Z7952 Long term (current) use of systemic steroids: Secondary | ICD-10-CM | POA: Diagnosis not present

## 2017-08-20 DIAGNOSIS — Z7951 Long term (current) use of inhaled steroids: Secondary | ICD-10-CM | POA: Insufficient documentation

## 2017-08-20 DIAGNOSIS — J8489 Other specified interstitial pulmonary diseases: Secondary | ICD-10-CM | POA: Diagnosis not present

## 2017-08-20 DIAGNOSIS — M069 Rheumatoid arthritis, unspecified: Secondary | ICD-10-CM | POA: Insufficient documentation

## 2017-08-20 DIAGNOSIS — M329 Systemic lupus erythematosus, unspecified: Secondary | ICD-10-CM | POA: Diagnosis not present

## 2017-08-20 DIAGNOSIS — I10 Essential (primary) hypertension: Secondary | ICD-10-CM | POA: Insufficient documentation

## 2017-08-20 HISTORY — PX: RIGHT HEART CATH: CATH118263

## 2017-08-20 HISTORY — PX: OTHER SURGICAL HISTORY: SHX169

## 2017-08-20 LAB — POCT I-STAT 3, VENOUS BLOOD GAS (G3P V)
ACID-BASE DEFICIT: 2 mmol/L (ref 0.0–2.0)
ACID-BASE DEFICIT: 3 mmol/L — AB (ref 0.0–2.0)
BICARBONATE: 22.2 mmol/L (ref 20.0–28.0)
Bicarbonate: 23.2 mmol/L (ref 20.0–28.0)
O2 SAT: 71 %
O2 SAT: 73 %
PCO2 VEN: 39.2 mmHg — AB (ref 44.0–60.0)
TCO2: 23 mmol/L (ref 22–32)
TCO2: 24 mmol/L (ref 22–32)
pCO2, Ven: 37.4 mmHg — ABNORMAL LOW (ref 44.0–60.0)
pH, Ven: 7.382 (ref 7.250–7.430)
pH, Ven: 7.382 (ref 7.250–7.430)
pO2, Ven: 38 mmHg (ref 32.0–45.0)
pO2, Ven: 39 mmHg (ref 32.0–45.0)

## 2017-08-20 SURGERY — RIGHT HEART CATH
Anesthesia: LOCAL

## 2017-08-20 MED ORDER — HEPARIN (PORCINE) IN NACL 2-0.9 UNIT/ML-% IJ SOLN
INTRAMUSCULAR | Status: AC
  Filled 2017-08-20: qty 1000

## 2017-08-20 MED ORDER — ASPIRIN 81 MG PO CHEW: 81.0000 mg | CHEWABLE_TABLET | ORAL | Status: DC

## 2017-08-20 MED ORDER — SODIUM CHLORIDE 0.9% FLUSH: 3.0000 mL | INTRAVENOUS | Status: DC | PRN

## 2017-08-20 MED ORDER — ONDANSETRON HCL 4 MG/2ML IJ SOLN: 4.0000 mg | Freq: Four times a day (QID) | INTRAMUSCULAR | Status: DC | PRN

## 2017-08-20 MED ORDER — LIDOCAINE HCL (PF) 1 % IJ SOLN
INTRAMUSCULAR | Status: DC | PRN
  Administered 2017-08-20: 15 mL

## 2017-08-20 MED ORDER — SODIUM CHLORIDE 0.9 % IV SOLN: 250.0000 mL | INTRAVENOUS | Status: DC | PRN

## 2017-08-20 MED ORDER — LIDOCAINE HCL 1 % IJ SOLN
INTRAMUSCULAR | Status: AC
  Filled 2017-08-20: qty 20

## 2017-08-20 MED ORDER — FENTANYL CITRATE (PF) 100 MCG/2ML IJ SOLN
INTRAMUSCULAR | Status: AC
  Filled 2017-08-20: qty 2

## 2017-08-20 MED ORDER — ACETAMINOPHEN 325 MG PO TABS: 650.0000 mg | ORAL_TABLET | ORAL | Status: DC | PRN

## 2017-08-20 MED ORDER — SODIUM CHLORIDE 0.9% FLUSH: 3.0000 mL | Freq: Two times a day (BID) | INTRAVENOUS | Status: DC

## 2017-08-20 MED ORDER — MIDAZOLAM HCL 2 MG/2ML IJ SOLN
INTRAMUSCULAR | Status: DC | PRN
  Administered 2017-08-20 (×2): 1 mg via INTRAVENOUS

## 2017-08-20 MED ORDER — SODIUM CHLORIDE 0.9 % IV SOLN: INTRAVENOUS | Status: DC

## 2017-08-20 MED ORDER — FENTANYL CITRATE (PF) 100 MCG/2ML IJ SOLN
INTRAMUSCULAR | Status: DC | PRN
  Administered 2017-08-20: 25 ug via INTRAVENOUS

## 2017-08-20 MED ORDER — HEPARIN (PORCINE) IN NACL 2-0.9 UNIT/ML-% IJ SOLN
INTRAMUSCULAR | Status: AC | PRN
  Administered 2017-08-20: 500 mL

## 2017-08-20 MED ORDER — MIDAZOLAM HCL 2 MG/2ML IJ SOLN
INTRAMUSCULAR | Status: AC
  Filled 2017-08-20: qty 2

## 2017-08-20 SURGICAL SUPPLY — 4 items
CATH SWAN GANZ 7F STRAIGHT (CATHETERS) ×2 IMPLANT
PACK CARDIAC CATHETERIZATION (CUSTOM PROCEDURE TRAY) ×2 IMPLANT
SHEATH PINNACLE 7F 10CM (SHEATH) ×2 IMPLANT
TRANSDUCER W/STOPCOCK (MISCELLANEOUS) ×2 IMPLANT

## 2017-08-20 NOTE — Interval H&P Note (Signed)
History and Physical Interval Note:  08/20/2017 12:36 PM  Shannon [name]  has presented today for surgery, with the diagnosis of hp  The various methods of treatment have been discussed with the patient and family. After consideration of risks, benefits and other options for treatment, the patient has consented to  Procedure(s): RIGHT HEART CATH (N/A) as a surgical intervention .  The patient's history has been reviewed, patient examined, no change in status, stable for surgery.  I have reviewed the patient's chart and labs.  Questions were answered to the patient's satisfaction.     Brianne Maina [employer]

## 2017-08-20 NOTE — Discharge Instructions (Signed)

## 2017-08-20 NOTE — Progress Notes (Signed)
Call from lab advising pt hemolyzed and Nigel Mormon in cath lab notified and per Selena Batten OK to bring client to cath lab

## 2017-08-21 ENCOUNTER — Encounter (HOSPITAL_COMMUNITY): Payer: Self-pay | Admitting: Cardiology

## 2017-08-21 MED FILL — Heparin Sodium (Porcine) 2 Unit/ML in Sodium Chloride 0.9%: INTRAMUSCULAR | Qty: 500 | Status: AC

## 2017-08-21 MED FILL — Lidocaine HCl Local Inj 1%: INTRAMUSCULAR | Qty: 20 | Status: AC

## 2017-09-02 NOTE — Progress Notes (Signed)
Office Visit Note  Patient: Shannon [name]             Date of Birth: 17-May-1971           MRN: [id number]             PCP: Eunice Blase, PA-C Referring: Eunice Blase, PA-C Visit Date: 09/16/2017 Occupation: @GUAROCC @    Subjective:  Bilateral hand pain    History of Present Illness: Shannon [name] is a 47 y.o. female with history of systemic lupus erythematosus, interstital lung disease, osteoarthritis.  States that she continues to take CellCept 1000 mg twice daily and prednisone 3 mg daily.  Patient states that she has been having increased bilateral hand pain and swelling.  She states she is also having pain in her right knee and bilateral feet.  She states she is having swelling in her right knee and feet as well.  She states that she continues to have severe ray nods in her bilateral hands and bilateral feet.  She states she wears gloves on a regular basis.  She denies any digital ulcerations.  She denies any recent flares of her lupus.  She denies any rashes, photosensitivity, or hair loss.  She denies any swollen lymph nodes.  She states she does have a sore in her mouth currently but denies any nasal ulcers. Patient states that her next visit with Dr. 59 is in April 2019.  States that she had an echo and a heart catheterization after her last visit with him. She states that she has been started on Norvasc for her blood pressure.    Activities of Daily Living:  Patient reports morning stiffness for 1.5 hours.   Patient Reports nocturnal pain.  Difficulty dressing/grooming: Reports Difficulty climbing stairs: Reports Difficulty getting out of chair: Reports Difficulty using hands for taps, buttons, cutlery, and/or writing: Reports   Review of Systems  Constitutional: Positive for fatigue.  HENT: Positive for mouth sores. Negative for mouth dryness and nose dryness.   Eyes: Negative for pain, visual disturbance and dryness.  Respiratory: Positive for shortness of  breath and difficulty breathing. Negative for cough and hemoptysis.   Cardiovascular: Positive for swelling in legs/feet. Negative for chest pain, palpitations and hypertension.  Gastrointestinal: Negative for abdominal pain, blood in stool, constipation and diarrhea.  Endocrine: Positive for increased urination.  Genitourinary: Negative for painful urination and pelvic pain.  Musculoskeletal: Positive for arthralgias, joint pain, joint swelling and morning stiffness. Negative for myalgias, muscle weakness, muscle tenderness and myalgias.  Skin: Positive for hair loss. Negative for color change, pallor, rash, nodules/bumps, skin tightness, ulcers and sensitivity to sunlight.  Allergic/Immunologic: Negative for susceptible to infections.  Neurological: Positive for dizziness, numbness, headaches, memory loss and weakness.  Hematological: Negative for bruising/bleeding tendency and swollen glands.  Psychiatric/Behavioral: Positive for depressed mood. Negative for sleep disturbance. The patient is nervous/anxious.     PMFS History:  Patient Active Problem List   Diagnosis Date Noted  . Acute bronchitis 02/13/2017  . Cough variant asthma  vs uacs  12/17/2016  . Morbid obesity due to excess calories (HCC) 12/17/2016  . Contracture of left elbow 11/21/2016  . Primary osteoarthritis of both knees 11/21/2016  . Pain in right hand 09/09/2016  . Pain in left hand 09/09/2016  . History of osteoarthritis 09/09/2016  . Rheumatoid factor positive 08/01/2016  . High risk medication use 01/16/2015  . G6PD deficiency (HCC) 10/31/2014  . Dyspnea 08/23/2014  . ILD (interstitial lung disease) (HCC) 08/23/2014  .  Other organ or system involvement in systemic lupus erythematosus (HCC) 08/23/2014    Past Medical History:  Diagnosis Date  . Anemia   . G6PD deficiency (HCC)   . ILD (interstitial lung disease) (HCC)   . Lupus   . Rheumatoid arthritis (HCC)     Family History  Problem Relation Age of  Onset  . Sarcoidosis Mother   . Lupus Sister   . Healthy Daughter   . Healthy Son   . Healthy Son    Past Surgical History:  Procedure Laterality Date  . APPENDECTOMY  1980  . CARDIAC CATHETERIZATION N/A 05/10/2015   Procedure: Right Heart Cath;  Surgeon: Laurey Morale, MD;  Location: [facility] INVASIVE CV LAB;  Service: Cardiovascular;  Laterality: N/A;  . CESAREAN SECTION  '95, '02, '07   X 3  . CHOLECYSTECTOMY  2010  . LAPAROSCOPIC HYSTERECTOMY  10/2015   have ovaries  . RIGHT HEART CATH N/A 08/20/2017   Procedure: RIGHT HEART CATH;  Surgeon: Laurey Morale, MD;  Location: [facility] INVASIVE CV LAB;  Service: Cardiovascular;  Laterality: N/A;  . TUBAL LIGATION     Social History   Social History Narrative   Married, lives with husband, children   Caffeine- coffee  1 daily     Objective: Vital Signs: BP (!) 168/104 (BP Location: Right Arm, Patient Position: Sitting, Cuff Size: Normal)   Pulse 93   Resp 18   Ht 5\' 11"  (1.803 m)   Wt 236 lb (107 kg)   LMP 04/28/2015   BMI 32.92 kg/m    Physical Exam  Constitutional: She is oriented to person, place, and time. She appears well-developed and well-nourished.  HENT:  Head: Normocephalic and atraumatic.  1 mouth ulcer noted.  No nasal ulcers.   Eyes: Conjunctivae and EOM are normal.  Neck: Normal range of motion.  Cardiovascular: Normal rate, regular rhythm, normal heart sounds and intact distal pulses.  Pulmonary/Chest: Effort normal.  Crackles in bilateral lung bases  Abdominal: Soft. Bowel sounds are normal.  Lymphadenopathy:    She has no cervical adenopathy.  Neurological: She is alert and oriented to person, place, and time.  Skin: Skin is warm and dry. Capillary refill takes 2 to 3 seconds.  Psychiatric: She has a normal mood and affect. Her behavior is normal.  Nursing note and vitals reviewed.    Musculoskeletal Exam: C-spine, thoracic spine, lumbar spine good range of motion.  No midline spinal tenderness.  No SI  joint tenderness.  Shoulder joints, wrist joints, MCPs, PIPs, DIPs good range of motion with no synovitis.  She has a left elbow contracture.  No digital ulcerations noted.  Hip joints, knee joints, ankle joints, MTPs, PIPs, DIPs good range of motion with no synovitis.  No warmth or effusion of bilateral knees.  No tenderness of trochanteric bursa.  CDAI Exam: No CDAI exam completed.    Investigation: No additional findings. CBC Latest Ref Rng & Units 08/05/2017 06/08/2017 02/13/2017  WBC 4.0 - 10.5 K/uL 3.2(L) 4.0 3.7(L)  Hemoglobin 12.0 - 15.0 g/dL 02/15/2017 11.7(L) 11.6(L)  Hematocrit 36.0 - 46.0 % 38.1 36.1 35.9(L)  Platelets 150 - 400 K/uL 212 185.0 198.0   CMP Latest Ref Rng & Units 08/05/2017 06/08/2017 02/13/2017  Glucose 65 - 99 mg/dL 80 85 90  BUN 6 - 20 mg/dL 12 13 11   Creatinine 0.44 - 1.00 mg/dL 02/15/2017 8.11  Sodium 135 - 145 mmol/L 139 139 138  Potassium 3.5 - 5.1 mmol/L 3.5 3.5 3.3(L)  Chloride 101 - 111 mmol/L 105 105 103  CO2 22 - 32 mmol/L 24 27 29   Calcium 8.9 - 10.3 mg/dL 9.0 8.9 9.1  Total Protein 6.0 - 8.3 g/dL - 7.8 7.7  Total Bilirubin 0.2 - 1.2 mg/dL - 0.3 0.4  Alkaline Phos 39 - 117 U/L - 58 57  AST 0 - 37 U/L - 11 11  ALT 0 - 35 U/L - 7 7    Imaging: No results found.  Speciality Comments: No specialty comments available.    Procedures:  No procedures performed Allergies: Asa [aspirin]; Penicillins; and Sulfa antibiotics   Assessment / Plan:     Visit Diagnoses: Other organ or system involvement in systemic lupus erythematosus (HCC) - +ANA, Smith, RNP, rheumatoid factor, history of arthritis with knee joint effusion, oral ulcers, Raynauds, anemia, neutropenia, ILD: She has not had any recent flares of her lupus.  Her main complaint is worsening symptoms of Raynaud's.  She was recently started on Norvasc 5 mg for management of her blood pressure. She was advised to follow up with PCP for further management of her blood pressure.  Her blood pressure was  very elevated today in the office. She was advised to stay on Norvasc and to wear gloves/thick socks and keep her core body temperature warm.  No digital ulcerations or signs of gangrene were noted.  She has oral ulceration noted on exam.  No nasal ulcers, cervical lymphadenopathy, rashes, or photosensitivity. No synovitis was noted on exam. She continues to take prednisone 3 mg daily and CellCept 1000 mg twice daily.  She will be following up with Dr. Marchelle Gearing in April 2019.  We will check autoimmune labs today.  She cannot be started on Plaquenil due to G6PD deficiency.- Plan: CBC with Differential/Platelet, COMPLETE METABOLIC PANEL WITH GFR, Urinalysis, Routine w reflex microscopic, ANA, Anti-DNA antibody, double-stranded, C3 and C4, Sedimentation rate, VITAMIN D 25 Hydroxy (Vit-D Deficiency, Fractures)  ILD (interstitial lung disease) (HCC) - Followed by Dr. Marchelle Gearing.  Her next visit is in April 2019.  She is on Cellcept.   High risk medication use - Cellcept 1,000 mg po BID and Prednisone 3 mg daily. CBC/CMP were ordered today to monitor for drug toxicity.- Plan: CBC with Differential/Platelet, COMPLETE METABOLIC PANEL WITH GFR  G6PD deficiency (HCC): She cannot be started on Plaquenil or Bactrim.  Primary osteoarthritis of both knees: No warmth or effusion on exam.  She has been having discomfort in her right knee.  She declined a x-ray today in the office.  She was given a handout of knee exercises that she can perform at home.  Rheumatoid factor positive  Other fatigue    Orders: Orders Placed This Encounter  Procedures  . CBC with Differential/Platelet  . COMPLETE METABOLIC PANEL WITH GFR  . Urinalysis, Routine w reflex microscopic  . ANA  . Anti-DNA antibody, double-stranded  . C3 and C4  . Sedimentation rate  . VITAMIN D 25 Hydroxy (Vit-D Deficiency, Fractures)   No orders of the defined types were placed in this encounter.    Follow-Up Instructions: Return in about 5  months (around 02/16/2018) for Systemic lupus erythematosus.   Gearldine Bienenstock, PA-C   I examined and evaluated the patient with Sherron Ales PA.  Patient had bibasilar crackles on my examination.  She had no active synovitis.  She continues to have some arthralgias.  We will obtain some labs since discussed above.  The plan of care was discussed as noted above.  [name],  MD  Note - This record has been created using Editor, commissioning.  Chart creation errors have been sought, but may not always  have been located. Such creation errors do not reflect on  the standard of medical care.

## 2017-09-16 ENCOUNTER — Ambulatory Visit: Payer: Self-pay | Admitting: Rheumatology

## 2017-09-16 ENCOUNTER — Ambulatory Visit: Payer: BLUE CROSS/BLUE SHIELD | Admitting: Physician Assistant

## 2017-09-16 ENCOUNTER — Encounter: Payer: Self-pay | Admitting: Physician Assistant

## 2017-09-16 VITALS — BP 168/104 | HR 93 | Resp 18 | Ht 71.0 in | Wt 236.0 lb

## 2017-09-16 DIAGNOSIS — M3219 Other organ or system involvement in systemic lupus erythematosus: Secondary | ICD-10-CM

## 2017-09-16 DIAGNOSIS — R768 Other specified abnormal immunological findings in serum: Secondary | ICD-10-CM | POA: Diagnosis not present

## 2017-09-16 DIAGNOSIS — J849 Interstitial pulmonary disease, unspecified: Secondary | ICD-10-CM | POA: Diagnosis not present

## 2017-09-16 DIAGNOSIS — M170 Bilateral primary osteoarthritis of knee: Secondary | ICD-10-CM

## 2017-09-16 DIAGNOSIS — R5383 Other fatigue: Secondary | ICD-10-CM

## 2017-09-16 DIAGNOSIS — D550 Anemia due to glucose-6-phosphate dehydrogenase [G6PD] deficiency: Secondary | ICD-10-CM

## 2017-09-16 DIAGNOSIS — Z79899 Other long term (current) drug therapy: Secondary | ICD-10-CM

## 2017-09-16 DIAGNOSIS — D75A Glucose-6-phosphate dehydrogenase (G6PD) deficiency without anemia: Secondary | ICD-10-CM

## 2017-09-16 NOTE — Patient Instructions (Signed)

## 2017-09-17 LAB — URINALYSIS, ROUTINE W REFLEX MICROSCOPIC
Bacteria, UA: NONE SEEN /HPF
Bilirubin Urine: NEGATIVE
GLUCOSE, UA: NEGATIVE
HGB URINE DIPSTICK: NEGATIVE
HYALINE CAST: NONE SEEN /LPF
Ketones, ur: NEGATIVE
NITRITE: NEGATIVE
Protein, ur: NEGATIVE
RBC / HPF: NONE SEEN /HPF (ref 0–2)
Specific Gravity, Urine: 1.019 (ref 1.001–1.03)
WBC UA: NONE SEEN /HPF (ref 0–5)
pH: 7.5 (ref 5.0–8.0)

## 2017-09-21 ENCOUNTER — Other Ambulatory Visit: Payer: Self-pay

## 2017-09-21 ENCOUNTER — Other Ambulatory Visit: Payer: Self-pay | Admitting: *Deleted

## 2017-09-21 DIAGNOSIS — Z79899 Other long term (current) drug therapy: Secondary | ICD-10-CM

## 2017-09-21 DIAGNOSIS — M3219 Other organ or system involvement in systemic lupus erythematosus: Secondary | ICD-10-CM

## 2017-09-21 DIAGNOSIS — E559 Vitamin D deficiency, unspecified: Secondary | ICD-10-CM

## 2017-09-23 ENCOUNTER — Telehealth: Payer: Self-pay | Admitting: Rheumatology

## 2017-09-23 MED ORDER — PREDNISONE 1 MG PO TABS: 3.0000 mg | ORAL_TABLET | Freq: Every day | ORAL | 0 refills | Status: DC

## 2017-09-23 NOTE — Telephone Encounter (Signed)
Patient called requesting prescription refill of Prednisone and her mouthwash (couldn't remember the name) for her ulcers.  [facility] [place].

## 2017-09-23 NOTE — Telephone Encounter (Signed)
Okay to give prescription for Magic mouthwash.

## 2017-09-23 NOTE — Telephone Encounter (Signed)
Last visit: 09/16/17 Next visit: 02/15/18  Prednisone refill sent to the pharmacy.   Patient states she has gotten a mouth prescribed by our office previously for mouth ulcers. Looking into patient's chart in Epic and SRS there is not a prescription for a mouth wash. Can we send a prescription in for her?

## 2017-09-24 MED ORDER — MAGIC MOUTHWASH W/LIDOCAINE: 5.0000 mL | Freq: Three times a day (TID) | ORAL | 0 refills | Status: DC

## 2017-09-24 NOTE — Telephone Encounter (Signed)
Prescription faxed to the pharmacy. Left message to advise patient.

## 2017-09-24 NOTE — Addendum Note (Signed)
Addended by: Henriette Combs on: 09/24/2017 03:01 PM   Modules accepted: Orders

## 2017-09-25 LAB — CBC WITH DIFFERENTIAL/PLATELET
BASOS PCT: 0.9 %
Basophils Absolute: 32 cells/uL (ref 0–200)
EOS ABS: 235 {cells}/uL (ref 15–500)
Eosinophils Relative: 6.7 %
HCT: 33.3 % — ABNORMAL LOW (ref 35.0–45.0)
Hemoglobin: 11.3 g/dL — ABNORMAL LOW (ref 11.7–15.5)
Lymphs Abs: 1257 cells/uL (ref 850–3900)
MCH: 30.6 pg (ref 27.0–33.0)
MCHC: 33.9 g/dL (ref 32.0–36.0)
MCV: 90.2 fL (ref 80.0–100.0)
MONOS PCT: 8.1 %
MPV: 13.4 fL — ABNORMAL HIGH (ref 7.5–12.5)
Neutro Abs: 1694 cells/uL (ref 1500–7800)
Neutrophils Relative %: 48.4 %
PLATELETS: 175 10*3/uL (ref 140–400)
RBC: 3.69 10*6/uL — ABNORMAL LOW (ref 3.80–5.10)
RDW: 12.2 % (ref 11.0–15.0)
TOTAL LYMPHOCYTE: 35.9 %
WBC mixed population: 284 cells/uL (ref 200–950)
WBC: 3.5 10*3/uL — ABNORMAL LOW (ref 3.8–10.8)

## 2017-09-25 LAB — ANA: Anti Nuclear Antibody(ANA): POSITIVE — AB

## 2017-09-25 LAB — COMPLETE METABOLIC PANEL WITH GFR
AG RATIO: 1 (calc) (ref 1.0–2.5)
ALKALINE PHOSPHATASE (APISO): 67 U/L (ref 33–115)
ALT: 9 U/L (ref 6–29)
AST: 13 U/L (ref 10–30)
Albumin: 3.9 g/dL (ref 3.6–5.1)
BILIRUBIN TOTAL: 0.5 mg/dL (ref 0.2–1.2)
BUN: 14 mg/dL (ref 7–25)
CALCIUM: 9 mg/dL (ref 8.6–10.2)
CO2: 24 mmol/L (ref 20–32)
Chloride: 106 mmol/L (ref 98–110)
Creat: 0.67 mg/dL (ref 0.50–1.10)
GFR, EST NON AFRICAN AMERICAN: 107 mL/min/{1.73_m2} (ref >=60)
GFR, Est African American: 124 mL/min/{1.73_m2} (ref >=60)
GLOBULIN: 4 g/dL — AB (ref 1.9–3.7)
Glucose, Bld: 81 mg/dL (ref 65–99)
POTASSIUM: 3.9 mmol/L (ref 3.5–5.3)
SODIUM: 138 mmol/L (ref 135–146)
Total Protein: 7.9 g/dL (ref 6.1–8.1)

## 2017-09-25 LAB — C3 AND C4
C3 COMPLEMENT: 132 mg/dL (ref 83–193)
C4 Complement: 28 mg/dL (ref 15–57)

## 2017-09-25 LAB — ANTI-NUCLEAR AB-TITER (ANA TITER): ANA Titer 1: >=1:1280 {titer} — AB

## 2017-09-25 LAB — ANTI-DNA ANTIBODY, DOUBLE-STRANDED

## 2017-09-25 LAB — VITAMIN D 25 HYDROXY (VIT D DEFICIENCY, FRACTURES): VIT D 25 HYDROXY: 25 ng/mL — AB (ref 30–100)

## 2017-09-25 LAB — SEDIMENTATION RATE: SED RATE: 36 mm/h — AB (ref 0–20)

## 2017-09-25 NOTE — Progress Notes (Signed)
Vit D 50,000U q wk X3 mths. Repeat Vit D in 3 months.Rest of the labs are stable.

## 2017-09-28 ENCOUNTER — Other Ambulatory Visit: Payer: Self-pay

## 2017-09-28 DIAGNOSIS — E559 Vitamin D deficiency, unspecified: Secondary | ICD-10-CM

## 2017-09-28 MED ORDER — VITAMIN D (ERGOCALCIFEROL) 1.25 MG (50000 UNIT) PO CAPS: 50000.0000 [IU] | ORAL_CAPSULE | ORAL | 0 refills | Status: DC

## 2017-10-15 ENCOUNTER — Encounter: Payer: Self-pay | Admitting: Internal Medicine

## 2017-10-15 ENCOUNTER — Ambulatory Visit: Payer: BLUE CROSS/BLUE SHIELD | Admitting: Internal Medicine

## 2017-10-15 VITALS — BP 120/76 | HR 91 | Ht 71.0 in | Wt 233.2 lb

## 2017-10-15 DIAGNOSIS — Z79899 Other long term (current) drug therapy: Secondary | ICD-10-CM | POA: Diagnosis not present

## 2017-10-15 DIAGNOSIS — R05 Cough: Secondary | ICD-10-CM | POA: Diagnosis not present

## 2017-10-15 DIAGNOSIS — J849 Interstitial pulmonary disease, unspecified: Secondary | ICD-10-CM

## 2017-10-15 DIAGNOSIS — R053 Chronic cough: Secondary | ICD-10-CM

## 2017-10-15 MED ORDER — BUDESONIDE-FORMOTEROL FUMARATE 80-4.5 MCG/ACT IN AERO: 2.0000 | INHALATION_SPRAY | Freq: Two times a day (BID) | RESPIRATORY_TRACT | 11 refills | Status: DC

## 2017-10-15 MED ORDER — ALBUTEROL SULFATE HFA 108 (90 BASE) MCG/ACT IN AERS: 2.0000 | INHALATION_SPRAY | Freq: Four times a day (QID) | RESPIRATORY_TRACT | 5 refills | Status: DC | PRN

## 2017-10-15 NOTE — Progress Notes (Signed)
Subjective:     Patient ID: Shannon [name], female   DOB: 1971/05/27, 47 y.o.   MRN: [id number]  HPI  PCP [name],[name], PA-C Referred by Dr [name]  HPI   IOV 08/23/2014 [name]    Pt referred by Dr. Corliss Skains for ILD. Pt c/o SOB with activity and rest, dry cough and chest tightness also with and without activity.    47 year old female referred for evaluation of autoimmune interstitial lung disease. She presents with her husband. In 2010 while living in [place] she reports she was diagnosed with lupus associated with rheumatoid arthritis in her joints. In 2012 she moved to live in [facility] and several months after that started noticing insidious onset of shortness of breath. Local rheumatologist diagnosed her with interstitial lung disease. She was referred to Dr. Herma Carson at [facility] in [facility] and was started on CellCept/prednisone for autoimmune interstitial lung disease he and however, sometime around 2 years ago she ran out of medical insurance and stopped taking these medications. During this time her dyspnea has progressed. It is currently rated as moderate to severe. It is present on exertion and relieved by rest. Even minimal amount of exertion makes her very ddyspneic. Now she has insurance and she did see Dr. Frederik Pear locally and she has autoimmune panel lab ordered. In addition exam revealed crackles and there for she's been referred here for reevaluation of interstitial lung disease and dyspnea. Dyspnea is associated with some chest tightness but no chest pain. This no associated dizzine   11/24/2014 Follow UP ILD  [name]  She has autoimmune ILD with RA/Lupus  She was seen 6 weeks ago, restarted on Cellcept.  Previously on cellcept but lost her insurance until recently.  On low dose prednisone 5mg  daily .   Last CT chest 4/4 showed ILD changes similar to 2013. Echo was ok with EFG 55-60%, nml PAP . In March .   Did not take dapsone ,due to  GP6D deficiency.  Labs ok last week with nml LFT , no sign change in hbg. /wbc.  She is feeling better. Does feel her breathing is some better.  No flare of cough or wheezing.     OV 01/16/2015  Chief Complaint  Patient presents with  . Follow-up    Pt c/o DOE, mild dry cough, and chest tightness when SOB. Pt states the chest tightness has improved).    follow-up interstitial lung disease in the setting of rheumatoid arthritis Follow-up high risk medication use - on CellCept and prednisone since mid April 2016  - Presents with her husband. Both give a history. Overall she is doing better in terms of dyspnea after starting CellCept and prednisone. However the improvement this only moderate. She still left with a residual moderate dyspnea on exertion that is also made worse with bending or heart air and improved with rest and cool air. It is associated with some cough and wheezing. She takes albuterol inhaler which she feels helps only somewhat but not fully and not quickly enough. She is frustrated by this. In addition she's complaining of some associated right lower back paraspinal spasm for which massage gives her relief. He has never attended pulmonary rehabilitation.  - Lab review shows she has had problems with potassium and has had potassium supplementation. Last lab check was  12/08/2014. She is due for lab test right now   OV 03/28/2015   Chief Complaint  Patient presents with  . Follow-up    3 month follow up. Pt states that she is still having some problems with her breathing. Pt c/o of feeling chest tightness, chest pain and cough that is dry. Pt denies wheeze. Pt states that she did trial the Advair and does feel that it helped some.   Follow-up interstitial lung disease secondary to autoimmune process and associated dyspnea that seems out  of proportion\  She presents with her husband. She continues on CellCept and prednisone. In the last visit approximately 2-3 months ago she had out of proportion dyspnea. I gave her some Advair to try. She says this only helped a little bit. Overall she says that dyspnea still persists. It is worse than in the spring 2016 when she started CellCept and prednisone. It is stable since July 2016. It is moderate in intensity. Occurs randomly at rest but also with exertion. Occasionally relieved by rest but also happens at rest. Heat and humidity make it worse. Advair helped a little bit only. This no associated chest tightness or wheezing. She did try and enroll  in pulmonary rehabilitation but could not afford the the startup program and is waiting to hear from them for the maintenance program. She is frustrated by all the symptoms.  Pulmonary function test October 15,016 today FVC 2.55 L/64%, total lung capacity 4 L/65% and diffusion 14.5/42%. Overall no change since April 2016   OV 07/04/2015  Chief Complaint  Patient presents with  . Follow-up    pt. states breathing is baseline. SOB. dry cough. wheezing. occ. chest pain/tightness. feels her airway is blocked.   Follow-up interstitial lung disease admitted autoimmune processes and associated out of proportion dyspnea  She presents again with her husband. She continues on CellCept 2 g twice daily associated with prednisone. She cannot take Bactrim or dapsone due to G6PD deficiency. Last seen in the fall of 2016. She was having out of proportion dyspnea. Rated cardia pulmonary stress test and she could not tolerate this test. She then underwent right heart catheterization mid-November 2016 with Dr. Marca Ancona. Review the tests this is normal. Overall stable but she and husband still continued to complain about this resting dyspnea associated with wheezing. They hear noises in the chest. Sometime she gasps for air even at rest. She feels it comes from  the chest but the husband points to the throat. I offered second opinion at [facility] because of this unusual symptoms but they declined citing distance. She was supposed to attend pulmonary rehabilitation but they cannot afford a $60 co-pay twice a week 8 weeks. Offered ENT evaluation that agreeable but they wanted done in [place] which is closer logistically. In addition patient is contemplating now applying for disability. 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  Walking desaturation test 185 feet 3 laps and rheumatic: No desaturation    OV 10/12/2015  Chief Complaint  Patient presents with  . Follow-up    PFT today.  breathing is better.  Pt is currently on STD, [employer] approved STD until today, wants to know today if pt is able to return to work, any restrictions.  Pt states that she has been more stable while out of work.      Follow-up interstitial lung disease due to autoimmune processes not otherwise specified  Last seen January 2017. Since then she has been on short-term  disability and out of work. She does heavy manual work. The lack of work estimated dyspnea better. She did pulmonary function test today that I personally visualized and overall it is same. There are no new issues. She is on CellCept and she is tolerating this fine. Last visit she had some anemia we follow this up and the anemia was better. Also hypokalemia resolved by February 2017. She is due for blood work today. 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 This showed up at last visit. She saw an ENT in [place] within referred her to [facility]. She does not want to go to [facility]. She ended up seeing Dr. Ezzard Standing locally. But now she is going to see Dr.  Dina Rich [facility]   Pulmonary function test today 10/12/2015 shows postbronchodilator FVC 2.65 L/67%. FEV1 2.34 L/72% which is up 17%  positive bronchodilator response. Ratio of 80/106%. Total lung capacity of 3.8/62%. DLCO 18.36/52%. Overall consistent with restriction and lung parenchymal disease. Overall PFTs are stable compared to April 2016 but perhaps in DLCO slightly better   OV 04/15/2016  Chief Complaint  Patient presents with  . Interstitial Lung Disease    Breathing is unchanged since last OV. Reports SOB, coughing. Cough is non productive. Denies chest tightness or wheezing.   Follow-up interstitial lung disease due to autoimmune processes not otherwise specified. On CellCept and prednisone. Not on Bactrim prophylaxis due to G6PD deficiency  Last visit April 2017. At that time based on pulmonary function tests showed stability and ILD for a year. 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 She did see local ENT doctor [place] and apparently has been reassured. At this point in time she is applying for long-term disability at work but the work discharged from services and she is now applying for Social Security disability. Her dyspnea stable since the interim. She's also had hysterectomy but for the last few weeks her cough is worse and it is dry. This no fever. Is no weight loss or chills. She says she's compliant with her CellCept and prednisone.  OV 05/29/2016  Chief Complaint  Patient presents with  . Follow-up    Pt states she still has harsh dry cough, pt states her SOB is unchanged and chest tightness when SOB or when coughing a lot. Pt deneis f/c/s.     Follow-up interstitial lung disease due to autoimmune processes not otherwise specified. On CellCept and prednisone since April 2016. Not on Bactrim prophylaxis due to G6PD deficiency  Villa returns for follow-up. She had high resolution CT chest that shows stability and interstitial lung disease since April 2016. She Pulmicort function test that shows mild improvement since April 2016. Therefore it appears that her CellCept  and prednisone is helping her she'll lung disease related to collagen-vascular disease. However she tells me that she continues to have chronic cough for the last few to several months. It is progressive. It is dry. It is worse than her baseline. The dyspnea is unchanged. This no fever or weight loss or chills   OV 07/24/2016  Chief Complaint  Patient presents with  . Follow-up    Pt states she feels the Arnuity has been helping her breathing. Pt c/o dry cough and occ chest tightness.   rec    ICD-9-CM ICD-10-CM   1. ILD (interstitial lung disease) (HCC) 515 J84.9  2. Chronic cough 786.2 R05   3. High risk medication use V58.69 Z79.899    Clinically improved with cellcept/prednisone and arnuity Blood work ok dec 2017  pLAN - start pulm rehab at [facility] - continue  aruity daily - continue cellcept and prednisone  Followup - 6 months do Pre-bd spiro and dlco only. No lung volume or bd response. No post-bd spiro - 6 months fu Dr Marchelle Gearing after above or sooner if needed     12/15/2016  f/u ov/Wert re: cough dry on arnuity  Chief Complaint  Patient presents with  . Follow-up    Breathing is unchanged since her last visit. Pt states she is here to f/u on recent ABG result. She c/o feeling tired all of the time. She has occ cough- non prod.       Finished at Rehab   beginning  Of May  2018 and trouble walking fast or uphill Waking up with ha's since rehab sev days a week  Cough is new x one month mostly dry and day > noct   No obvious day to day or daytime variability or assoc excess/ purulent sputum or mucus plugs or hemoptysis or cp or chest tightness, subjective wheeze or overt sinus or hb symptoms. No unusual exp hx or h/o childhood pna/ asthma or knowledge of premature birth.   OV 02/13/2017  Chief Complaint  Patient presents with  . Follow-up    cough/ILD follow up - prod cough with brownish/green mucus with tightness and chest pain x2 days.  denies any f/c/s,  hemoptysis.  PFT done today.    Follow-up interstitial lung disease due to autoimmune processes not otherwise specified. On CellCept and prednisone since April 2016. Not on Bactrim prophylaxis due to G6PD deficiency  47 year old female immunosuppressed with CellCept and prednisone. Not seen her in many months. Currently taking prednisone 3 mg per day and CellCept 1000 mg twice daily. She cannot do Bactrim prophylaxis because of G6PD deficiency. She tells me that overall her health has been stable but in the last few weeks noticing increased shortness of breath in the last few days this increased cough and change in color of sputum to green and increased chest tightness and a feeling that she is getting acute bronchitis. She also was contemplated going to the emergency room a few days ago but now she is better. There is no obvious fever or chills or hemoptysis or edema paroxysmal nocturnal dyspnea or orthopnea. Pulmonary function test today shows 10% decline in FVC and DLCO compared to baseline.   OV 06/08/2017  Chief Complaint  Patient presents with  . Follow-up    Feeling about the same as last visit. Still having chest tightness and wheezing at times, Sounds hoarse.     Follow-up interstitial lung disease due to autoimmune processes not otherwise specified. On CellCept and prednisone since April 2016. Not on Bactrim prophylaxis due to G6PD deficiency. Normal Right heart cath Nov 2016.  Last high-resolution CT November 2017  Last visit August 2018.  There is a routine follow-up.  Overall she feels stable.  FVC shows stability since August 2018 but declined since 1 year ago.  DLCO shows continued decline.  Though her lung health is stable.  She says she lost her 1 only biological sister last week due to lupus.  The sister was only 4 and lived in [place].  There are no new issues. Walking desaturation test on 06/08/2017 185 feet x 3 laps on ROOM AIR:  did not  desaturate. Rest pulse ox was  100%%, final pulse ox was 100%. HR response 81/min at rest to 109/min at peak exertion. Patient KIELEY AKTER  Did not Desaturate < 88% . Judye Bos did nto  Desaturated </= 3% points. KALYSSA ANKER yes did get tachyardic   OV 10/15/2017   Follow-up interstitial lung disease due to autoimmune processes not otherwise specified. On CellCept and prednisone since April 2016. Not on Bactrim prophylaxis due to G6PD deficiency. Normal Right heart cath Nov 2016 and feb 2019.  Last high-resolution CT November 2017 and dec 2018 without progresion   Last visit December 2018.  This is a routine follow-up.  In the interim she had high-resolution CT scan of the chest that did not show any progression in interstitial lung disease between 2017 and 2018.Marland Kitchen  Therefore we did an echocardiogram that showed slight elevation in pulmonary artery systolic pressure.  Therefore we sent it to her repeat right heart catheterization and this was normal as documented below.  Overall she feels stable compared to one year ago but has declined compared to 2 years ago.  She is also complaining about a new recurrence of cough that happens mostly at night despite Symbicort and prednisone and CellCept.  It wakes her up.  It is moderate in intensity.  There is no associated wheezing or edema orthopnea.  It happens randomly.  There is associated heartburn.  She is on nothing for acid reflux.     Results for AUNA, MIKKELSEN (MRN [id number]) as of 02/13/2017 12:16  Ref. Range 10/09/2014 09:39 03/28/2015 11:40 10/12/2015 13:42 05/06/2016 10:38 02/13/2017 10:49 06/08/2017   FVC-Pre Latest Units: L 2.54 2.55 2.53 2.63 2.32 2.31  FVC-%Pred-Pre Latest Units: % 64 64 64 67 59 59%   Results for TERRALYN, MATSUMURA (MRN [id number]) as of 02/13/2017 12:16  Ref. Range 10/09/2014 09:39 03/28/2015 11:40 10/12/2015 13:42 05/06/2016 10:38 02/13/2017 10:49 06/08/2017   DLCO unc Latest Units: ml/min/mmHg 15.03 14.50 18.36 18.31 16.35 14.21  DLCO unc % pred  Latest Units: % 43 42 53 53 47 41     Right Heart Pressures 08/20/17 [facility] Procedural Findings: Hemodynamics (mmHg) RA mean 3 RV 30/6 PA 23/8, mean 14 PCWP mean 8  Oxygen saturations: PA 72% AO 98%  Cardiac Output (Fick) 6.73  Cardiac Index (Fick) 3.06  Cardiac Output (Thermo) 6.94 Cardiac Index (Thermo) 3.15      has a past medical history of Anemia, G6PD deficiency (HCC), ILD (interstitial lung disease) (HCC), Lupus (HCC), and Rheumatoid arthritis (HCC).   reports that she quit smoking about 3 years ago. Her smoking use included cigarettes. She has a 7.00 pack-year smoking history. She has never used smokeless tobacco.  Past Surgical History:  Procedure Laterality Date  . APPENDECTOMY  1980  . CARDIAC CATHETERIZATION N/A 05/10/2015   Procedure: Right Heart Cath;  Surgeon: Laurey Morale, MD;  Location[facility] INVASIVE CV LAB;  Service: Cardiovascular;  Laterality: N/A;  . CESAREAN SECTION  '95, '02, '07   X 3  . CHOLECYSTECTOMY  2010  . LAPAROSCOPIC HYSTERECTOMY  10/2015   have ovaries  . RIGHT HEART CATH N/A 08/20/2017   Procedure: RIGHT HEART CATH;  Surgeon: Laurey Morale, MD;  Location: [facility] INVASIVE CV LAB;  Service: Cardiovascular;  Laterality: N/A;  . TUBAL LIGATION      Allergies  Allergen Reactions  . Asa [Aspirin] Hives and Shortness Of Breath    Chest tightness   . Penicillins Hives    Has  patient had a PCN reaction causing immediate rash, facial/tongue/throat swelling, SOB or lightheadedness with hypotension: Unknown Has patient had a PCN reaction causing severe rash involving mucus membranes or skin necrosis: Unknown Has patient had a PCN reaction that required hospitalization: Unknown Has patient had a PCN reaction occurring within the last 10 years: No If all of the above answers are "NO", then may proceed with Cephalosporin use.   . Sulfa Antibiotics     G6PD deficiency     Immunization History  Administered Date(s) Administered  . Influenza Split  03/23/2014  . Influenza,inj,Quad PF,6+ Mos 03/15/2016, 06/08/2017  . Pneumococcal-Unspecified 08/23/2014    Family History  Problem Relation Age of Onset  . Sarcoidosis Mother   . Lupus Sister   . Healthy Daughter   . Healthy Son   . Healthy Son      Current Outpatient Medications:  .  albuterol (PROVENTIL HFA;VENTOLIN HFA) 108 (90 Base) MCG/ACT inhaler, Inhale 2 puffs into the lungs every 6 (six) hours as needed for wheezing or shortness of breath., Disp: 1 Inhaler, Rfl: 5 .  amLODipine (NORVASC) 5 MG tablet, Take 1 tablet (5 mg total) by mouth daily., Disp: 30 tablet, Rfl: 3 .  budesonide-formoterol (SYMBICORT) 80-4.5 MCG/ACT inhaler, Inhale 2 puffs into the lungs 2 (two) times daily., Disp: 1 Inhaler, Rfl: 11 .  diclofenac sodium (VOLTAREN) 1 % GEL, Apply 2 g topically 4 (four) times daily as needed (pain). , Disp: , Rfl:  .  ferrous sulfate 325 (65 FE) MG tablet, Take 325 mg by mouth daily with breakfast. , Disp: , Rfl:  .  gabapentin (NEURONTIN) 300 MG capsule, Take 300 mg by mouth 3 (three) times daily., Disp: , Rfl:  .  ibuprofen (ADVIL,MOTRIN) 200 MG tablet, Take 600 mg by mouth every 6 (six) hours as needed for moderate pain. , Disp: , Rfl:  .  magic mouthwash w/lidocaine SOLN, Take 5 mLs by mouth 3 (three) times daily. Swish and spit, Disp: 100 mL, Rfl: 0 .  mycophenolate (CELLCEPT) 500 MG tablet, Take 2 tablets (1,000 mg total) by mouth 2 (two) times daily., Disp: 360 tablet, Rfl: 1 .  oxyCODONE (OXY IR/ROXICODONE) 5 MG immediate release tablet, Take 5 mg by mouth 2 (two) times daily as needed. , Disp: , Rfl:  .  predniSONE (DELTASONE) 1 MG tablet, Take 3 tablets (3 mg total) by mouth daily with breakfast., Disp: 90 tablet, Rfl: 0 .  Vitamin D, Ergocalciferol, (DRISDOL) 50000 units CAPS capsule, Take 1 capsule (50,000 Units total) by mouth every 7 (seven) days., Disp: 12 capsule, Rfl: 0   Review of Systems     Objective:   Physical Exam  Constitutional: She is oriented  to person, place, and time. She appears well-developed and well-nourished. No distress.  HENT:  Head: Normocephalic and atraumatic.  Right Ear: External ear normal.  Left Ear: External ear normal.  Mouth/Throat: Oropharynx is clear and moist. No oropharyngeal exudate.  Eyes: Pupils are equal, round, and reactive to light. Conjunctivae and EOM are normal. Right eye exhibits no discharge. Left eye exhibits no discharge. No scleral icterus.  Neck: Normal range of motion. Neck supple. No JVD present. No tracheal deviation present. No thyromegaly present.  Cardiovascular: Normal rate, regular rhythm, normal heart sounds and intact distal pulses. Exam reveals no gallop and no friction rub.  No murmur heard. Pulmonary/Chest: Effort normal and breath sounds normal. No respiratory distress. She has no wheezes. She has no rales. She exhibits no tenderness.  Abdominal: Soft.  Bowel sounds are normal. She exhibits no distension and no mass. There is no tenderness. There is no rebound and no guarding.  Musculoskeletal: Normal range of motion. She exhibits no edema or tenderness.  Lymphadenopathy:    She has no cervical adenopathy.  Neurological: She is alert and oriented to person, place, and time. She has normal reflexes. No cranial nerve deficit. She exhibits normal muscle tone. Coordination normal.  Skin: Skin is warm and dry. No rash noted. She is not diaphoretic. No erythema. No pallor.  Psychiatric: She has a normal mood and affect. Her behavior is normal. Judgment and thought content normal.  Vitals reviewed.  Vitals:   10/15/17 1330  BP: 120/76  Pulse: 91  SpO2: 100%  Weight: 233 lb 3.2 oz (105.8 kg)  Height: 5\' 11"  (1.803 m)    Estimated body mass index is 32.52 kg/m as calculated from the following:   Height as of this encounter: 5\' 11"  (1.803 m).   Weight as of this encounter: 233 lb 3.2 oz (105.8 kg).     Assessment:       ICD-10-CM   1. ILD (interstitial lung disease) (HCC) J84.9    2. High risk medication use Z79.899   3. Chronic cough R05        Plan:     ILD (interstitial lung disease) (HCC) High risk medication use  - clinically ILD with decline over 2 years but stable over 1 year  - continue cellcept and prednisone as before - labs blood April 2019 normal so wont do 10/15/2017  - take consent form for ILD PRO study - this is a registry study where we try to learn more about patients like you - will discuss more at followup  Chronic cough - might be acid reflux esp when at night despite symbicort/prednisone  - start OTC prilosec 20mg  daily on empty stomach -= start OTC ranitidine 150mg  daily at night   - [facility] is cheapest for this -If it appears the Prilosec helps her chronic cough and acid reflux then we might have to consider changing her CellCept to Myfortic  Followup  - 6 weeks do Pre-bd spiro and dlco only. No lung volume or bd response. No post-bd spiro - return in 6 weeks to see progress iwht cough and ILD   > 50% of this > 25 min visit spent in face to face counseling or coordination of care    Dr. May 2019, M.D., [facility].C.P Pulmonary and Critical Care Medicine Staff Physician, [facility] Center Director - Interstitial Lung Disease  Program  Pulmonary Fibrosis [facility] at [facility] [place], [facility], [facility]  Pager: [phone], If no answer or between  15:00h - 7:00h: call [phone] Telephone: [phone]

## 2017-10-15 NOTE — Addendum Note (Signed)
Addended by: Wyvonne Lenz on: 10/15/2017 02:13 PM   Modules accepted: Orders

## 2017-10-15 NOTE — Patient Instructions (Addendum)
ILD (interstitial lung disease) (HCC) High risk medication use  - clinically ILD with decline over 2 years but stable over 1 year  - continue cellcept and prednisone as before - labs blood April 2019 normal so wont do 10/15/2017  - take consent form for ILD PRO study - this is a registry study where we try to learn more about patients like you - will discuss more at followup  Chronic cough - might be acid reflux esp when at night despite symbicort/prednisone  - start OTC prilosec 20mg  daily on empty stomach -= start OTC ranitidine 150mg  daily at night   - [facility] is cheapest for this  Followup  - 6 weeks do Pre-bd spiro and dlco only. No lung volume or bd response. No post-bd spiro - return in 6 weeks to see progress iwht cough and ILD

## 2017-10-15 NOTE — Addendum Note (Signed)
Addended by: Wyvonne Lenz on: 10/15/2017 02:06 PM   Modules accepted: Orders

## 2017-11-02 ENCOUNTER — Other Ambulatory Visit (HOSPITAL_COMMUNITY): Payer: Self-pay | Admitting: *Deleted

## 2017-11-02 MED ORDER — AMLODIPINE BESYLATE 5 MG PO TABS: 5.0000 mg | ORAL_TABLET | Freq: Every day | ORAL | 3 refills | Status: DC

## 2017-12-01 ENCOUNTER — Other Ambulatory Visit (INDEPENDENT_AMBULATORY_CARE_PROVIDER_SITE_OTHER): Payer: BLUE CROSS/BLUE SHIELD

## 2017-12-01 ENCOUNTER — Ambulatory Visit (INDEPENDENT_AMBULATORY_CARE_PROVIDER_SITE_OTHER): Payer: BLUE CROSS/BLUE SHIELD | Admitting: Internal Medicine

## 2017-12-01 ENCOUNTER — Telehealth: Payer: Self-pay | Admitting: Internal Medicine

## 2017-12-01 ENCOUNTER — Ambulatory Visit: Payer: BLUE CROSS/BLUE SHIELD | Admitting: Internal Medicine

## 2017-12-01 ENCOUNTER — Encounter: Payer: Self-pay | Admitting: Internal Medicine

## 2017-12-01 VITALS — BP 118/78 | HR 104 | Ht 71.5 in | Wt 227.4 lb

## 2017-12-01 DIAGNOSIS — Z79899 Other long term (current) drug therapy: Secondary | ICD-10-CM | POA: Diagnosis not present

## 2017-12-01 DIAGNOSIS — R42 Dizziness and giddiness: Secondary | ICD-10-CM

## 2017-12-01 DIAGNOSIS — J849 Interstitial pulmonary disease, unspecified: Secondary | ICD-10-CM

## 2017-12-01 LAB — CBC WITH DIFFERENTIAL/PLATELET
BASOS ABS: 0 10*3/uL (ref 0.0–0.1)
Basophils Relative: 1 % (ref 0.0–3.0)
EOS PCT: 1.2 % (ref 0.0–5.0)
Eosinophils Absolute: 0.1 10*3/uL (ref 0.0–0.7)
HEMATOCRIT: 35.4 % — AB (ref 36.0–46.0)
Hemoglobin: 11.7 g/dL — ABNORMAL LOW (ref 12.0–15.0)
LYMPHS ABS: 1.7 10*3/uL (ref 0.7–4.0)
LYMPHS PCT: 37.9 % (ref 12.0–46.0)
MCHC: 33.1 g/dL (ref 30.0–36.0)
MCV: 91.3 fl (ref 78.0–100.0)
MONOS PCT: 8.6 % (ref 3.0–12.0)
Monocytes Absolute: 0.4 10*3/uL (ref 0.1–1.0)
Neutro Abs: 2.4 10*3/uL (ref 1.4–7.7)
Neutrophils Relative %: 51.3 % (ref 43.0–77.0)
PLATELETS: 215 10*3/uL (ref 150.0–400.0)
RBC: 3.87 Mil/uL (ref 3.87–5.11)
RDW: 12.7 % (ref 11.5–15.5)
WBC: 4.6 10*3/uL (ref 4.0–10.5)

## 2017-12-01 LAB — PULMONARY FUNCTION TEST
DL/VA % pred: 78 %
DL/VA: 4.35 ml/min/mmHg/L
DLCO UNC % PRED: 47 %
DLCO unc: 16.38 ml/min/mmHg
FEF 25-75 PRE: 2.23 L/s
FEF2575-%PRED-PRE: 69 %
FEV1-%Pred-Pre: 62 %
FEV1-PRE: 1.98 L
FEV1FVC-%PRED-PRE: 101 %
FEV6-%Pred-Pre: 62 %
FEV6-Pre: 2.38 L
FEV6FVC-%PRED-PRE: 102 %
FVC-%Pred-Pre: 61 %
FVC-Pre: 2.38 L
PRE FEV1/FVC RATIO: 83 %
Pre FEV6/FVC Ratio: 100 %

## 2017-12-01 LAB — BASIC METABOLIC PANEL
BUN: 9 mg/dL (ref 6–23)
CALCIUM: 9.5 mg/dL (ref 8.4–10.5)
CHLORIDE: 102 meq/L (ref 96–112)
CO2: 30 meq/L (ref 19–32)
Creatinine, Ser: 0.81 mg/dL (ref 0.40–1.20)
GFR: 98.33 mL/min (ref >60.00)
GLUCOSE: 94 mg/dL (ref 70–99)
POTASSIUM: 3.8 meq/L (ref 3.5–5.1)
SODIUM: 139 meq/L (ref 135–145)

## 2017-12-01 LAB — HEPATIC FUNCTION PANEL
ALBUMIN: 4 g/dL (ref 3.5–5.2)
ALK PHOS: 69 U/L (ref 39–117)
ALT: 8 U/L (ref 0–35)
AST: 13 U/L (ref 0–37)
Bilirubin, Direct: 0.1 mg/dL (ref 0.0–0.3)
TOTAL PROTEIN: 7.9 g/dL (ref 6.0–8.3)
Total Bilirubin: 0.3 mg/dL (ref 0.2–1.2)

## 2017-12-01 LAB — MAGNESIUM: MAGNESIUM: 1.8 mg/dL (ref 1.5–2.5)

## 2017-12-01 LAB — PHOSPHORUS: PHOSPHORUS: 2.8 mg/dL (ref 2.3–4.6)

## 2017-12-01 NOTE — Progress Notes (Signed)
Patient completed pre spiro and DLCO today. 

## 2017-12-01 NOTE — Addendum Note (Signed)
Addended by: Wyvonne Lenz on: 12/01/2017 03:32 PM   Modules accepted: Orders

## 2017-12-01 NOTE — Telephone Encounter (Signed)
error 

## 2017-12-01 NOTE — Progress Notes (Addendum)
Subjective:     Patient ID: Shannon [name], female   DOB: 24-Oct-1970, 47 y.o.   MRN: [id number]  HPI  PCP [name],[name], PA-C Referred by Dr [name]  HPI   IOV 08/23/2014 [name]    Pt referred by Dr. Corliss Skains for ILD. Pt c/o SOB with activity and rest, dry cough and chest tightness also with and without activity.    47 year old female referred for evaluation of autoimmune interstitial lung disease. She presents with her husband. In 2010 while living in [place] she reports she was diagnosed with lupus associated with rheumatoid arthritis in her joints. In 2012 she moved to live in [facility] and several months after that started noticing insidious onset of shortness of breath. Local rheumatologist diagnosed her with interstitial lung disease. She was referred to Dr. Herma Carson at [facility] in [facility] and was started on CellCept/prednisone for autoimmune interstitial lung disease he and however, sometime around 2 years ago she ran out of medical insurance and stopped taking these medications. During this time her dyspnea has progressed. It is currently rated as moderate to severe. It is present on exertion and relieved by rest. Even minimal amount of exertion makes her very ddyspneic. Now she has insurance and she did see Dr. Frederik Pear locally and she has autoimmune panel lab ordered. In addition exam revealed crackles and there for she's been referred here for reevaluation of interstitial lung disease and dyspnea. Dyspnea is associated with some chest tightness but no chest pain. This no associated dizzine   11/24/2014 Follow UP ILD  [name]  She has autoimmune ILD with RA/Lupus  She was seen 6 weeks ago, restarted on Cellcept.  Previously on cellcept but lost her insurance until recently.  On low dose prednisone 5mg  daily .   Last CT chest 4/4 showed ILD changes similar to 2013. Echo was ok with EFG 55-60%, nml PAP . In March .   Did not take dapsone ,due to  GP6D deficiency.  Labs ok last week with nml LFT , no sign change in hbg. /wbc.  She is feeling better. Does feel her breathing is some better.  No flare of cough or wheezing.     OV 01/16/2015  Chief Complaint  Patient presents with  . Follow-up    Pt c/o DOE, mild dry cough, and chest tightness when SOB. Pt states the chest tightness has improved).    follow-up interstitial lung disease in the setting of rheumatoid arthritis Follow-up high risk medication use - on CellCept and prednisone since mid April 2016  - Presents with her husband. Both give a history. Overall she is doing better in terms of dyspnea after starting CellCept and prednisone. However the improvement this only moderate. She still left with a residual moderate dyspnea on exertion that is also made worse with bending or heart air and improved with rest and cool air. It is associated with some cough and wheezing. She takes albuterol inhaler which she feels helps only somewhat but not fully and not quickly enough. She is frustrated by this. In addition she's complaining of some associated right lower back paraspinal spasm for which massage gives her relief. He has never attended pulmonary rehabilitation.  - Lab review shows she has had problems with potassium and has had potassium supplementation. Last lab check was  12/08/2014. She is due for lab test right now   OV 03/28/2015   Chief Complaint  Patient presents with  . Follow-up    3 month follow up. Pt states that she is still having some problems with her breathing. Pt c/o of feeling chest tightness, chest pain and cough that is dry. Pt denies wheeze. Pt states that she did trial the Advair and does feel that it helped some.   Follow-up interstitial lung disease secondary to autoimmune process and associated dyspnea that seems out  of proportion\  She presents with her husband. She continues on CellCept and prednisone. In the last visit approximately 2-3 months ago she had out of proportion dyspnea. I gave her some Advair to try. She says this only helped a little bit. Overall she says that dyspnea still persists. It is worse than in the spring 2016 when she started CellCept and prednisone. It is stable since July 2016. It is moderate in intensity. Occurs randomly at rest but also with exertion. Occasionally relieved by rest but also happens at rest. Heat and humidity make it worse. Advair helped a little bit only. This no associated chest tightness or wheezing. She did try and enroll  in pulmonary rehabilitation but could not afford the the startup program and is waiting to hear from them for the maintenance program. She is frustrated by all the symptoms.  Pulmonary function test October 15,016 today FVC 2.55 L/64%, total lung capacity 4 L/65% and diffusion 14.5/42%. Overall no change since April 2016   OV 07/04/2015  Chief Complaint  Patient presents with  . Follow-up    pt. states breathing is baseline. SOB. dry cough. wheezing. occ. chest pain/tightness. feels her airway is blocked.   Follow-up interstitial lung disease admitted autoimmune processes and associated out of proportion dyspnea  She presents again with her husband. She continues on CellCept 2 g twice daily associated with prednisone. She cannot take Bactrim or dapsone due to G6PD deficiency. Last seen in the fall of 2016. She was having out of proportion dyspnea. Rated cardia pulmonary stress test and she could not tolerate this test. She then underwent right heart catheterization mid-November 2016 with Dr. Marca Ancona. Review the tests this is normal. Overall stable but she and husband still continued to complain about this resting dyspnea associated with wheezing. They hear noises in the chest. Sometime she gasps for air even at rest. She feels it comes from  the chest but the husband points to the throat. I offered second opinion at [facility] because of this unusual symptoms but they declined citing distance. She was supposed to attend pulmonary rehabilitation but they cannot afford a $60 co-pay twice a week 8 weeks. Offered ENT evaluation that agreeable but they wanted done in [place] which is closer logistically. In addition patient is contemplating now applying for disability. 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  Walking desaturation test 185 feet 3 laps and rheumatic: No desaturation    OV 10/12/2015  Chief Complaint  Patient presents with  . Follow-up    PFT today.  breathing is better.  Pt is currently on STD, [employer] approved STD until today, wants to know today if pt is able to return to work, any restrictions.  Pt states that she has been more stable while out of work.      Follow-up interstitial lung disease due to autoimmune processes not otherwise specified  Last seen January 2017. Since then she has been on short-term  disability and out of work. She does heavy manual work. The lack of work estimated dyspnea better. She did pulmonary function test today that I personally visualized and overall it is same. There are no new issues. She is on CellCept and she is tolerating this fine. Last visit she had some anemia we follow this up and the anemia was better. Also hypokalemia resolved by February 2017. She is due for blood work today. 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 This showed up at last visit. She saw an ENT in [place] within referred her to [facility]. She does not want to go to [facility]. She ended up seeing Dr. Ezzard Standing locally. But now she is going to see Dr.  Dina Rich [facility]   Pulmonary function test today 10/12/2015 shows postbronchodilator FVC 2.65 L/67%. FEV1 2.34 L/72% which is up 17%  positive bronchodilator response. Ratio of 80/106%. Total lung capacity of 3.8/62%. DLCO 18.36/52%. Overall consistent with restriction and lung parenchymal disease. Overall PFTs are stable compared to April 2016 but perhaps in DLCO slightly better   OV 04/15/2016  Chief Complaint  Patient presents with  . Interstitial Lung Disease    Breathing is unchanged since last OV. Reports SOB, coughing. Cough is non productive. Denies chest tightness or wheezing.   Follow-up interstitial lung disease due to autoimmune processes not otherwise specified. On CellCept and prednisone. Not on Bactrim prophylaxis due to G6PD deficiency  Last visit April 2017. At that time based on pulmonary function tests showed stability and ILD for a year. 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 She did see local ENT doctor [place] and apparently has been reassured. At this point in time she is applying for long-term disability at work but the work discharged from services and she is now applying for Social Security disability. Her dyspnea stable since the interim. She's also had hysterectomy but for the last few weeks her cough is worse and it is dry. This no fever. Is no weight loss or chills. She says she's compliant with her CellCept and prednisone.  OV 05/29/2016  Chief Complaint  Patient presents with  . Follow-up    Pt states she still has harsh dry cough, pt states her SOB is unchanged and chest tightness when SOB or when coughing a lot. Pt deneis f/c/s.     Follow-up interstitial lung disease due to autoimmune processes not otherwise specified. On CellCept and prednisone since April 2016. Not on Bactrim prophylaxis due to G6PD deficiency  [place]illa returns for follow-up. She had high resolution CT chest that shows stability and interstitial lung disease since April 2016. She Pulmicort function test that shows mild improvement since April 2016. Therefore it appears that her CellCept  and prednisone is helping her she'll lung disease related to collagen-vascular disease. However she tells me that she continues to have chronic cough for the last few to several months. It is progressive. It is dry. It is worse than her baseline. The dyspnea is unchanged. This no fever or weight loss or chills   OV 07/24/2016  Chief Complaint  Patient presents with  . Follow-up    Pt states she feels the Arnuity has been helping her breathing. Pt c/o dry cough and occ chest tightness.   rec    ICD-9-CM ICD-10-CM   1. ILD (interstitial lung disease) (HCC) 515 J84.9  2. Chronic cough 786.2 R05   3. High risk medication use V58.69 Z79.899    Clinically improved with cellcept/prednisone and arnuity Blood work ok dec 2017  pLAN - start pulm rehab at [facility] - continue  aruity daily - continue cellcept and prednisone  Followup - 6 months do Pre-bd spiro and dlco only. No lung volume or bd response. No post-bd spiro - 6 months fu Dr Marchelle Gearing after above or sooner if needed     12/15/2016  f/u ov/Wert re: cough dry on arnuity  Chief Complaint  Patient presents with  . Follow-up    Breathing is unchanged since her last visit. Pt states she is here to f/u on recent ABG result. She c/o feeling tired all of the time. She has occ cough- non prod.       Finished at Rehab   beginning  Of May  2018 and trouble walking fast or uphill Waking up with ha's since rehab sev days a week  Cough is new x one month mostly dry and day > noct   No obvious day to day or daytime variability or assoc excess/ purulent sputum or mucus plugs or hemoptysis or cp or chest tightness, subjective wheeze or overt sinus or hb symptoms. No unusual exp hx or h/o childhood pna/ asthma or knowledge of premature birth.   OV 02/13/2017  Chief Complaint  Patient presents with  . Follow-up    cough/ILD follow up - prod cough with brownish/green mucus with tightness and chest pain x2 days.  denies any f/c/s,  hemoptysis.  PFT done today.    Follow-up interstitial lung disease due to autoimmune processes not otherwise specified. On CellCept and prednisone since April 2016. Not on Bactrim prophylaxis due to G6PD deficiency  47 year old female immunosuppressed with CellCept and prednisone. Not seen her in many months. Currently taking prednisone 3 mg per day and CellCept 1000 mg twice daily. She cannot do Bactrim prophylaxis because of G6PD deficiency. She tells me that overall her health has been stable but in the last few weeks noticing increased shortness of breath in the last few days this increased cough and change in color of sputum to green and increased chest tightness and a feeling that she is getting acute bronchitis. She also was contemplated going to the emergency room a few days ago but now she is better. There is no obvious fever or chills or hemoptysis or edema paroxysmal nocturnal dyspnea or orthopnea. Pulmonary function test today shows 10% decline in FVC and DLCO compared to baseline.   OV 06/08/2017  Chief Complaint  Patient presents with  . Follow-up    Feeling about the same as last visit. Still having chest tightness and wheezing at times, Sounds hoarse.     Follow-up interstitial lung disease due to autoimmune processes not otherwise specified. On CellCept and prednisone since April 2016. Not on Bactrim prophylaxis due to G6PD deficiency. Normal Right heart cath Nov 2016.  Last high-resolution CT November 2017  Last visit August 2018.  There is a routine follow-up.  Overall she feels stable.  FVC shows stability since August 2018 but declined since 1 year ago.  DLCO shows continued decline.  Though her lung health is stable.  She says she lost her 1 only biological sister last week due to lupus.  The sister was only 4 and lived in [place].  There are no new issues. Walking desaturation test on 06/08/2017 185 feet x 3 laps on ROOM AIR:  did not  desaturate. Rest pulse ox was  100%%, final pulse ox was 100%. HR response 81/min at rest to 109/min at peak exertion. Patient Shannon [name]  Did not Desaturate < 88% . Shannon [name] did nto  Desaturated </= 3% points. Shannon [name] yes did get tachyardic   OV 10/15/2017   Follow-up interstitial lung disease due to autoimmune processes not otherwise specified. On CellCept and prednisone since April 2016. Not on Bactrim prophylaxis due to G6PD deficiency. Normal Right heart cath Nov 2016 and feb 2019.  Last high-resolution CT November 2017 and dec 2018 without progresion   Last visit December 2018.  This is a routine follow-up.  In the interim she had high-resolution CT scan of the chest that did not show any progression in interstitial lung disease between 2017 and 2018.Marland Kitchen  Therefore we did an echocardiogram that showed slight elevation in pulmonary artery systolic pressure.  Therefore we sent it to her repeat right heart catheterization and this was normal as documented below.  Overall she feels stable compared to one year ago but has declined compared to 2 years ago.  She is also complaining about a new recurrence of cough that happens mostly at night despite Symbicort and prednisone and CellCept.  It wakes her up.  It is moderate in intensity.  There is no associated wheezing or edema orthopnea.  It happens randomly.  There is associated heartburn.  She is on nothing for acid reflux.       Right Heart Pressures 08/20/17 [facility] Procedural Findings: Hemodynamics (mmHg) RA mean 3 RV 30/6 PA 23/8, mean 14 PCWP mean 8  Oxygen saturations: PA 72% AO 98%  Cardiac Output (Fick) 6.73  Cardiac Index (Fick) 3.06  Cardiac Output (Thermo) 6.94 Cardiac Index (Thermo) 3.15    OV 12/01/2017  Chief Complaint  Patient presents with  . Follow-up    Pt has SOB with exertion, wheezing, some chest tightness. Pt was dry cough more at bedtime.      Follow-up interstitial lung disease due to autoimmune processes not otherwise  specified. On CellCept and prednisone since April 2016. Not on Bactrim prophylaxis due to G6PD deficiency. Normal Right heart cath Nov 2016 and feb 2019.  Last high-resolution CT November 2017 and dec 2018 without progresion   This visit is to see if she is got progressive interstitial lung disease.  Last visit she was complaining of more cough.  Therefore we added some acid reflux treatment.  She says after the acid reflux treatment symptoms have actually improved.  This contradicts what she told the CMA at intake.  Overall she is feeling stable.  However she does have a new episode of orthostatic dizziness going on at a mild level for the last 1 week.  Today after doing pulmonary function test she did feel dizzy.  Therefore we checked orthostatics on her and she got tachycardic standing up and her blood pressure did drop although still within normal limits.  She does not have any chest pain.  Lung function test shows continued stability in the last 1 year including compared to the most recent attempt.   Results for Shannon [name], Shannon [name] (MRN [id number]) as of 02/13/2017 12:16  Ref. Range 10/09/2014 09:39 03/28/2015 11:40 10/12/2015 13:42 05/06/2016 10:38 02/13/2017 10:49 06/08/2017  12/01/2017   FVC-Pre Latest Units: L 2.54 2.55 2.53 2.63 2.32 2.31 2.38  FVC-%Pred-Pre Latest Units: % 64 64 64 67 59 59% 61%   Results for Shannon [name], Shannon [name] (MRN [id number]) as of 02/13/2017 12:16  Ref. Range 10/09/2014 09:39 03/28/2015 11:40 10/12/2015 13:42 05/06/2016 10:38 02/13/2017 10:49 06/08/2017  12/01/2017   DLCO unc Latest Units: ml/min/mmHg 15.03 14.50 18.36 18.31 16.35 14.21 16.38  DLCO unc % pred Latest Units: % 43 42 53 53 47 41 47%     has a past medical history of Anemia, G6PD deficiency (HCC), ILD (interstitial lung disease) (HCC), Lupus (HCC), and Rheumatoid arthritis (HCC).   reports that she quit smoking about 3 years ago. Her smoking use included cigarettes. She has a 7.00 pack-year smoking history. She has never  used smokeless tobacco.  Past Surgical History:  Procedure Laterality Date  . APPENDECTOMY  1980  . CARDIAC CATHETERIZATION N/A 05/10/2015   Procedure: Right Heart Cath;  Surgeon: Laurey Morale, MD;  Location: [facility] INVASIVE CV LAB;  Service: Cardiovascular;  Laterality: N/A;  . CESAREAN SECTION  '95, '02, '07   X 3  . CHOLECYSTECTOMY  2010  . LAPAROSCOPIC HYSTERECTOMY  10/2015   have ovaries  . RIGHT HEART CATH N/A 08/20/2017   Procedure: RIGHT HEART CATH;  Surgeon: Laurey Morale, MD;  Location: [facility] INVASIVE CV LAB;  Service: Cardiovascular;  Laterality: N/A;  . TUBAL LIGATION      Allergies  Allergen Reactions  . Asa [Aspirin] Hives and Shortness Of Breath    Chest tightness   . Penicillins Hives    Has patient had a PCN reaction causing immediate rash, facial/tongue/throat swelling, SOB or lightheadedness with hypotension: Unknown Has patient had a PCN reaction causing severe rash involving mucus membranes or skin necrosis: Unknown Has patient had a PCN reaction that required hospitalization: Unknown Has patient had a PCN reaction occurring within the last 10 years: No If all of the above answers are "NO", then may proceed with Cephalosporin use.   . Sulfa Antibiotics     G6PD deficiency     Immunization History  Administered Date(s) Administered  . Influenza Split 03/23/2014  . Influenza,inj,Quad PF,6+ Mos 03/15/2016, 06/08/2017  . Pneumococcal-Unspecified 08/23/2014    Family History  Problem Relation Age of Onset  . Sarcoidosis Mother   . Lupus Sister   . Healthy Daughter   . Healthy Son   . Healthy Son      Current Outpatient Medications:  .  albuterol (PROVENTIL HFA;VENTOLIN HFA) 108 (90 Base) MCG/ACT inhaler, Inhale 2 puffs into the lungs every 6 (six) hours as needed for wheezing or shortness of breath., Disp: 1 Inhaler, Rfl: 5 .  amLODipine (NORVASC) 5 MG tablet, Take 1 tablet (5 mg total) by mouth daily., Disp: 30 tablet, Rfl: 3 .  budesonide-formoterol  (SYMBICORT) 80-4.5 MCG/ACT inhaler, Inhale 2 puffs into the lungs 2 (two) times daily., Disp: 1 Inhaler, Rfl: 11 .  diclofenac sodium (VOLTAREN) 1 % GEL, Apply 2 g topically 4 (four) times daily as needed (pain). , Disp: , Rfl:  .  ferrous sulfate 325 (65 FE) MG tablet, Take 325 mg by mouth daily with breakfast. , Disp: , Rfl:  .  gabapentin (NEURONTIN) 300 MG capsule, Take 300 mg by mouth 3 (three) times daily., Disp: , Rfl:  .  ibuprofen (ADVIL,MOTRIN) 200 MG tablet, Take 600 mg by mouth every 6 (six) hours as needed for moderate pain. , Disp: , Rfl:  .  magic mouthwash w/lidocaine SOLN, Take 5 mLs by mouth 3 (three) times daily. Swish and spit, Disp: 100 mL, Rfl: 0 .  mycophenolate (CELLCEPT) 500 MG tablet, Take 2 tablets (1,000 mg total) by mouth 2 (two) times daily., Disp: 360  tablet, Rfl: 1 .  oxyCODONE (OXY IR/ROXICODONE) 5 MG immediate release tablet, Take 5 mg by mouth 2 (two) times daily as needed. , Disp: , Rfl:  .  predniSONE (DELTASONE) 1 MG tablet, Take 3 tablets (3 mg total) by mouth daily with breakfast., Disp: 90 tablet, Rfl: 0 .  Vitamin D, Ergocalciferol, (DRISDOL) 50000 units CAPS capsule, Take 1 capsule (50,000 Units total) by mouth every 7 (seven) days., Disp: 12 capsule, Rfl: 0   Review of Systems     Objective:   Physical Exam  Constitutional: She is oriented to person, place, and time. She appears well-developed and well-nourished. No distress.  HENT:  Head: Normocephalic and atraumatic.  Right Ear: External ear normal.  Left Ear: External ear normal.  Mouth/Throat: Oropharynx is clear and moist. No oropharyngeal exudate.  Eyes: Pupils are equal, round, and reactive to light. Conjunctivae and EOM are normal. Right eye exhibits no discharge. Left eye exhibits no discharge. No scleral icterus.  Neck: Normal range of motion. Neck supple. No JVD present. No tracheal deviation present. No thyromegaly present.  Cardiovascular: Normal rate, regular rhythm, normal heart  sounds and intact distal pulses. Exam reveals no gallop and no friction rub.  No murmur heard. Pulmonary/Chest: Effort normal. No respiratory distress. She has no wheezes. She has rales. She exhibits no tenderness.  Abdominal: Soft. Bowel sounds are normal. She exhibits no distension and no mass. There is no tenderness. There is no rebound and no guarding.  Musculoskeletal: Normal range of motion. She exhibits no edema or tenderness.  Lymphadenopathy:    She has no cervical adenopathy.  Neurological: She is alert and oriented to person, place, and time. She has normal reflexes. No cranial nerve deficit. She exhibits normal muscle tone. Coordination normal.  Skin: Skin is warm and dry. No rash noted. She is not diaphoretic. No erythema. No pallor.  Psychiatric: She has a normal mood and affect. Her behavior is normal. Judgment and thought content normal.  Vitals reviewed.  Today's Vitals   12/01/17 1443  BP: 118/78  Pulse: (!) 104  SpO2: 100%  Weight: 227 lb 6.4 oz (103.1 kg)  Height: 5' 11.5" (1.816 m)    Estimated body mass index is 31.27 kg/m as calculated from the following:   Height as of this encounter: 5' 11.5" (1.816 m).   Weight as of this encounter: 227 lb 6.4 oz (103.1 kg).  Supine - > 140/90, HR 68. Puilse ox 99% Standing immediately-> 120/80, HR 92, Pulse ox 99% - Standing 1 min -> 120/80. HR 103, Pulse ox 96%       Assessment:       ICD-10-CM   1. ILD (interstitial lung disease) (HCC) J84.9   2. High risk medication use Z79.899   3. Orthostatic dizziness R42        Plan:     ILD (interstitial lung disease) (HCC) High risk medication use  - clinically ILD with decline over 2 years but stable over 1 year as of 12/01/2017 - continue cellcept and prednisone as before - do blodo work - cbc. Bmet, lft, mag, phos 12/01/2017 - you do not qualify for ILD PRO study - this is a registry study - glad cough is better with prilosec and ranitidine  - cotninue the  2  Dizziness - new issues  - your blood pressure drops and heart rate goes up when you stand up - will check blood work 12/01/2017 for this - as above   - talk to PCP  [name], [name], PA-C about this  - careful getting up fast  Followup  - 3 months do Pre-bd spiro only. No DLCO. Marland Kitchen No lung volume or bd response. No post-bd spiro - return in 3 months to see progress    Dr. Kalman Shan, M.D., [facility].C.P Pulmonary and Critical Care Medicine Staff Physician, [facility] Center Director - Interstitial Lung Disease  Program  Pulmonary Fibrosis [facility] at [facility] [place], [place], [address]  Pager: [phone], If no answer or between  15:00h - 7:00h: call [phone] Telephone: [phone]

## 2017-12-01 NOTE — Patient Instructions (Addendum)
ILD (interstitial lung disease) (HCC) High risk medication use  - clinically ILD with decline over 2 years but stable over 1 year as of 12/01/2017 - continue cellcept and prednisone as before - do blodo work - cbc. Bmet, lft, mag, phos 12/01/2017 - you do not qualify for ILD PRO study - this is a registry study - glad cough is better with prilosec and ranitidine  - cotninue the 2  Dizziness  - your blood pressure drops and heart rate goes up when you stand up  - talk to PCP [name], [name], PA-C about this  - careful getting up fast  Followup  - 3 months do Pre-bd spiro only. No DLCO. Marland Kitchen No lung volume or bd response. No post-bd spiro - return in 3 months to see progress

## 2017-12-21 ENCOUNTER — Other Ambulatory Visit: Payer: Self-pay | Admitting: Rheumatology

## 2017-12-21 NOTE — Telephone Encounter (Signed)
Last Visit: 09/16/17 Next Visit: 02/15/18  Okay to refill per Dr. Corliss Skains

## 2018-02-01 NOTE — Progress Notes (Deleted)
Office Visit Note  Patient: Shannon [name]             Date of Birth: May 31, 1971           MRN: [id number]             PCP: Eunice Blase, PA-C Referring: Eunice Blase, PA-C Visit Date: 02/15/2018 Occupation: @GUAROCC @  Subjective:  No chief complaint on file.   History of Present Illness: Shannon [name] is a 47 y.o. female ***   Activities of Daily Living:  Patient reports morning stiffness for *** {minute/hour:19697}.   Patient {ACTIONS;DENIES/REPORTS:21021675::"Denies"} nocturnal pain.  Difficulty dressing/grooming: {ACTIONS;DENIES/REPORTS:21021675::"Denies"} Difficulty climbing stairs: {ACTIONS;DENIES/REPORTS:21021675::"Denies"} Difficulty getting out of chair: {ACTIONS;DENIES/REPORTS:21021675::"Denies"} Difficulty using hands for taps, buttons, cutlery, and/or writing: {ACTIONS;DENIES/REPORTS:21021675::"Denies"}  No Rheumatology ROS completed.   PMFS History:  Patient Active Problem List   Diagnosis Date Noted  . Acute bronchitis 02/13/2017  . Cough variant asthma  vs uacs  12/17/2016  . Morbid obesity due to excess calories (HCC) 12/17/2016  . Contracture of left elbow 11/21/2016  . Primary osteoarthritis of both knees 11/21/2016  . Pain in right hand 09/09/2016  . Pain in left hand 09/09/2016  . History of osteoarthritis 09/09/2016  . Rheumatoid factor positive 08/01/2016  . High risk medication use 01/16/2015  . G6PD deficiency (HCC) 10/31/2014  . Dyspnea 08/23/2014  . ILD (interstitial lung disease) (HCC) 08/23/2014  . Other organ or system involvement in systemic lupus erythematosus (HCC) 08/23/2014    Past Medical History:  Diagnosis Date  . Anemia   . G6PD deficiency (HCC)   . ILD (interstitial lung disease) (HCC)   . Lupus (HCC)   . Rheumatoid arthritis (HCC)     Family History  Problem Relation Age of Onset  . Sarcoidosis Mother   . Lupus Sister   . Healthy Daughter   . Healthy Son   . Healthy Son    Past Surgical History:  Procedure  Laterality Date  . APPENDECTOMY  1980  . CARDIAC CATHETERIZATION N/A 05/10/2015   Procedure: Right Heart Cath;  Surgeon: 05/12/2015, MD;  Location: [facility] INVASIVE CV LAB;  Service: Cardiovascular;  Laterality: N/A;  . CESAREAN SECTION  '95, '02, '07   X 3  . CHOLECYSTECTOMY  2010  . LAPAROSCOPIC HYSTERECTOMY  10/2015   have ovaries  . RIGHT HEART CATH N/A 08/20/2017   Procedure: RIGHT HEART CATH;  Surgeon: 08/22/2017, MD;  Location: [facility] INVASIVE CV LAB;  Service: Cardiovascular;  Laterality: N/A;  . TUBAL LIGATION     Social History   Social History Narrative   Married, lives with husband, children   Caffeine- coffee  1 daily    Objective: Vital Signs: LMP 04/28/2015    Physical Exam   Musculoskeletal Exam: ***  CDAI Exam: CDAI Score: Not documented Patient Global Assessment: Not documented; Provider Global Assessment: Not documented Swollen: Not documented; Tender: Not documented Joint Exam   Not documented   There is currently no information documented on the homunculus. Go to the Rheumatology activity and complete the homunculus joint exam.  Investigation: No additional findings.  Imaging: No results found.  Recent Labs: Lab Results  Component Value Date   WBC 4.6 12/01/2017   HGB 11.7 (L) 12/01/2017   PLT 215.0 12/01/2017   NA 139 12/01/2017   K 3.8 12/01/2017   CL 102 12/01/2017   CO2 30 12/01/2017   GLUCOSE 94 12/01/2017   BUN 9 12/01/2017   CREATININE 0.81 12/01/2017   BILITOT  0.3 12/01/2017   ALKPHOS 69 12/01/2017   AST 13 12/01/2017   ALT 8 12/01/2017   PROT 7.9 12/01/2017   ALBUMIN 4.0 12/01/2017   CALCIUM 9.5 12/01/2017   GFRAA 124 09/23/2017    Speciality Comments: No specialty comments available.  Procedures:  No procedures performed Allergies: Asa [aspirin]; Penicillins; and Sulfa antibiotics   Assessment / Plan:     Visit Diagnoses: No diagnosis found.   Orders: No orders of the defined types were placed in this  encounter.  No orders of the defined types were placed in this encounter.   Face-to-face time spent with patient was *** minutes. Greater than 50% of time was spent in counseling and coordination of care.  Follow-Up Instructions: No follow-ups on file.   Gearldine Bienenstock, PA-C  Note - This record has been created using Dragon software.  Chart creation errors have been sought, but may not always  have been located. Such creation errors do not reflect on  the standard of medical care.

## 2018-02-15 ENCOUNTER — Ambulatory Visit: Payer: BLUE CROSS/BLUE SHIELD | Admitting: Physician Assistant

## 2018-02-15 NOTE — Progress Notes (Signed)
Office Visit Note  Patient: Shannon [name]             Date of Birth: 1970/12/17           MRN: [id number]             PCP: Eunice Blase, PA-C Referring: Eunice Blase, PA-C Visit Date: 02/26/2018 Occupation: @GUAROCC @  Subjective:  Fatigue   History of Present Illness: Shannon [name] is a 47 y.o. female with history of systemic lupus erythematosus, ILD, and osteoarthritis. Patient is on Cellcept 1,000 mg po BID and Prednisone 3 mg daily.  She denies missing any doses recently.  She states that on Monday she felt like she was having a lupus flare.  She was having significant fatigue and arthralgias.  She denies any low grade fevers recently.  She states she has having increased joint stiffness.  She reports pain and swelling in both knees and both hands. She continues to have Raynaud's in hands and feet but denies digital ulcerations.  She has mouth dryness but no eye dryness. She denies mouth or nose sores.  She states she has a rash that is itchy between her breasts, but denies any facial rashes or other skin lesions.  She continues to have sun sensitivity and wears sunscreen if she goes outside. She noticed swollen lymph nodes last week.  She continues to follow up with Dr. Monday every 2-3 months.  She will be seeing in on 03/08/18.     Activities of Daily Living:  Patient reports morning stiffness for 15-20  minutes.   Patient Reports nocturnal pain.  Difficulty dressing/grooming: Denies Difficulty climbing stairs: Reports Difficulty getting out of chair: Reports Difficulty using hands for taps, buttons, cutlery, and/or writing: Reports  Review of Systems  Constitutional: Positive for fatigue.  HENT: Positive for mouth sores (1 month ago) and mouth dryness. Negative for nose dryness.   Eyes: Negative for pain, visual disturbance and dryness.  Respiratory: Positive for shortness of breath (Rest and with exertion). Negative for cough, hemoptysis and difficulty breathing.     Cardiovascular: Negative for chest pain, palpitations, hypertension and swelling in legs/feet.  Gastrointestinal: Positive for constipation. Negative for blood in stool and diarrhea.  Endocrine: Negative for increased urination.  Genitourinary: Negative for painful urination.  Musculoskeletal: Positive for arthralgias, joint pain and joint swelling. Negative for myalgias, muscle weakness, morning stiffness, muscle tenderness and myalgias.  Skin: Positive for color change, hair loss and sensitivity to sunlight. Negative for pallor, rash, nodules/bumps, skin tightness and ulcers.  Allergic/Immunologic: Negative for susceptible to infections.  Neurological: Negative for dizziness, numbness, headaches and weakness.  Hematological: Positive for swollen glands (1 wk ago).  Psychiatric/Behavioral: Positive for depressed mood and sleep disturbance. The patient is nervous/anxious.     PMFS History:  Patient Active Problem List   Diagnosis Date Noted  . Acute bronchitis 02/13/2017  . Cough variant asthma  vs uacs  12/17/2016  . Morbid obesity due to excess calories (HCC) 12/17/2016  . Contracture of left elbow 11/21/2016  . Primary osteoarthritis of both knees 11/21/2016  . Pain in right hand 09/09/2016  . Pain in left hand 09/09/2016  . History of osteoarthritis 09/09/2016  . Rheumatoid factor positive 08/01/2016  . High risk medication use 01/16/2015  . G6PD deficiency (HCC) 10/31/2014  . Dyspnea 08/23/2014  . ILD (interstitial lung disease) (HCC) 08/23/2014  . Other organ or system involvement in systemic lupus erythematosus (HCC) 08/23/2014    Past Medical History:  Diagnosis Date  .  Anemia   . G6PD deficiency (HCC)   . ILD (interstitial lung disease) (HCC)   . Lupus (HCC)   . Rheumatoid arthritis (HCC)     Family History  Problem Relation Age of Onset  . Sarcoidosis Mother   . Lupus Sister   . Healthy Daughter   . Healthy Son   . Healthy Son    Past Surgical History:   Procedure Laterality Date  . APPENDECTOMY  1980  . CARDIAC CATHETERIZATION N/A 05/10/2015   Procedure: Right Heart Cath;  Surgeon: Laurey Morale, MD;  Location: [facility] INVASIVE CV LAB;  Service: Cardiovascular;  Laterality: N/A;  . CESAREAN SECTION  '95, '02, '07   X 3  . CHOLECYSTECTOMY  2010  . LAPAROSCOPIC HYSTERECTOMY  10/2015   have ovaries  . RIGHT HEART CATH N/A 08/20/2017   Procedure: RIGHT HEART CATH;  Surgeon: Laurey Morale, MD;  Location: [facility] INVASIVE CV LAB;  Service: Cardiovascular;  Laterality: N/A;  . TUBAL LIGATION     Social History   Social History Narrative   Married, lives with husband, children   Caffeine- coffee  1 daily    Objective: Vital Signs: BP (!) 146/91 (BP Location: Left Arm, Patient Position: Sitting, Cuff Size: Large)   Pulse 72   Resp 15   Ht 5\' 11"  (1.803 m)   Wt 240 lb (108.9 kg)   LMP 04/28/2015   BMI 33.47 kg/m    Physical Exam  Constitutional: She is oriented to person, place, and time. She appears well-developed and well-nourished.  HENT:  Head: Normocephalic and atraumatic.  No oral or nasal ulcerations.  No parotid swelling.  Eyes: Conjunctivae and EOM are normal.  Neck: Normal range of motion.  Cardiovascular: Normal rate, regular rhythm, normal heart sounds and intact distal pulses.  Pulmonary/Chest: Effort normal. She has rales.  Abdominal: Soft. Bowel sounds are normal.  Lymphadenopathy:    She has cervical adenopathy.  Neurological: She is alert and oriented to person, place, and time.  Skin: Skin is warm and dry. Capillary refill takes 2 to 3 seconds.  Red-brown moist homogeneous patches noted underneath and between bilateral breasts   No digital ulcerations or signs of gangrene noted.   No malar rash noted.    Psychiatric: She has a normal mood and affect. Her behavior is normal.  Nursing note and vitals reviewed.    Musculoskeletal Exam: C-spine, thoracic spine, lumbar spine good range of motion.  No midline  spinal tenderness.  No SI joint tenderness.  Shoulder joints good range of motion with no discomfort. Left elbow contracture. Wrist joints full range of motion with discomfort.  He has tenderness of bilateral wrist joints.  MCPs, PIPs, DIPs good range of motion with no synovitis.  She has tenderness of several MCP joints.  She is complete fist formation bilaterally.  Hip joints good range of motion with no discomfort.  Right knee limited extension with discomfort.  Mild warmth noted.  Left knee full range of motion with discomfort.  Tenderness and mild inflammation of bilateral ankle joints.  CDAI Exam: CDAI Score: Not documented Patient Global Assessment: Not documented; Provider Global Assessment: Not documented Swollen: 0 ; Tender: 8  Joint Exam      Right  Left  Wrist   Tender   Tender  MCP 4   Tender     MCP 5   Tender     Knee   Tender   Tender  Ankle   Tender   Tender  Investigation: No additional findings.  Imaging: No results found.  Recent Labs: Lab Results  Component Value Date   WBC 4.6 12/01/2017   HGB 11.7 (L) 12/01/2017   PLT 215.0 12/01/2017   NA 139 12/01/2017   K 3.8 12/01/2017   CL 102 12/01/2017   CO2 30 12/01/2017   GLUCOSE 94 12/01/2017   BUN 9 12/01/2017   CREATININE 0.81 12/01/2017   BILITOT 0.3 12/01/2017   ALKPHOS 69 12/01/2017   AST 13 12/01/2017   ALT 8 12/01/2017   PROT 7.9 12/01/2017   ALBUMIN 4.0 12/01/2017   CALCIUM 9.5 12/01/2017   GFRAA 124 09/23/2017    Speciality Comments: No specialty comments available.  Procedures:  No procedures performed Allergies: Asa [aspirin]; Penicillins; and Sulfa antibiotics     Assessment / Plan:     Visit Diagnoses: Other organ or system involvement in systemic lupus erythematosus (HCC) - +ANA, Smith, RNP, rheumatoid factor, history of arthritis with knee joint effusion, oral ulcers, Raynauds, anemia, neutropenia, ILD: She reports feeling like she was having a lupus flare on Monday. She has had  significant fatigue this week, but denies any low grade fevers.  She has also been having increased arthralgias and myalgias.  She has warmth and limited extension of right knee joint.  She has tenderness of bilateral wrist joints.  She has palpable tender cervical lymphadenopathy on exam.  She has no oral or nasal ulcerations on exam. She continues to have mouth dryness but no parotid swelling was noted.  She continues to have hair loss.  She continues to have some sensitivity and wears sunscreen on a regular basis.  No malar rash was noted today.  She does have a yeast skin infection between her breast and was given a prescription for Clotrimazole 1 % cream that she will start applying topically. she has been taking Cellcept 1,000 mg BID and Prednisone 3 mg po daily.  Since she is having signs of a lupus flare we will prescribe a prednisone taper starting at 10 mg and tapering by 2.5 mg once a week.  Once she finishes the 5 mg dose, she was advised to resume her regular prednisone dosage of 3 mg by mouth daily.  A refill of prednisone was sent to the pharmacy today.  We will check autoimmune labs today.   Plan: CBC with Differential/Platelet, COMPLETE METABOLIC PANEL WITH GFR, Urinalysis, Routine w reflex microscopic, ANA, Anti-DNA antibody, double-stranded, C3 and C4, Sedimentation rate  High risk medication use - Cellcept 1,000 mg po BID and Prednisone 3 mg po daily. CBC and CMP were ordered today to monitor for drug toxicity.  - Plan: CBC with Differential/Platelet, COMPLETE METABOLIC PANEL WITH GFR  ILD (interstitial lung disease) (HCC) - According to Dr. Jane Canary note on 12/01/2017 ILD has been stable for the past 1 year.  He advised her to continue taking CellCept 1,000 mg po BID and prednisone 3 mg daily.  He checks her lab work on a regular basis as well.  She will be following up with him on 03/08/2018.  She denies any new or worsening pulmonary symptoms at this time. She has rales at bilateral lung  bases.   G6PD deficiency (HCC) - She cannot be started on Plaquenil or Bactrim.  Skin yeast infection: Red-brown moist homogeneous patches noted underneath and between bilateral breasts.  The rash has been very itchy. Excoriations noted. She was advised to not scratch the patches due to an increased risk of infection. She has been applying hydrocortisone  cream that she not been helping. She was given a prescription for Clotrimazole 1% cream that she can apply twice daily to affected area.  She was advised to try the cream for 2 weeks, and if the rash does not improve she should follow up with PCP.    Primary osteoarthritis of both knees: She has warmth and limited extension of right knee joint.  Left knee full ROM with discomfort.    Rheumatoid factor positive  Other fatigue: Worsening.   Contracture of left elbow: Chronic.  No synovitis or tenderness on exam.   Vitamin D deficiency   Orders: Orders Placed This Encounter  Procedures  . CBC with Differential/Platelet  . COMPLETE METABOLIC PANEL WITH GFR  . Urinalysis, Routine w reflex microscopic  . ANA  . Anti-DNA antibody, double-stranded  . C3 and C4  . Sedimentation rate   Meds ordered this encounter  Medications  . predniSONE (DELTASONE) 1 MG tablet    Sig: TAKE 3 TABLETS BY MOUTH ONCE DAILY WITH BREAKFAST    Dispense:  90 tablet    Refill:  0  . predniSONE (DELTASONE) 5 MG tablet    Sig: Take 2 tablets by mouth daily for 1 week, take 1.5 tablets by mouth for 1 week, 1 tablet by mouth for 1 week    Dispense:  32 tablet    Refill:  0  . clotrimazole (LOTRIMIN) 1 % cream    Sig: Apply 1 application topically 2 (two) times daily.    Dispense:  60 g    Refill:  0    Face-to-face time spent with patient was 30 minutes. Greater than 50% of time was spent in counseling and coordination of care.  Follow-Up Instructions: Return in about 5 months (around 07/29/2018) for Systemic lupus erythematosus, Osteoarthritis.   Gearldine Bienenstock, PA-C.  I examined and evaluated the patient with Sherron Ales PA.  Patient appears to be having a flare of lupus.  She had some lymphadenopathy and also increased arthralgias.  We gave her a short prednisone taper.  We will also obtain her labs today.  The plan of care was discussed as noted above.  [name], MD Note - This record has been created using Animal nutritionist.  Chart creation errors have been sought, but may not always  have been located. Such creation errors do not reflect on  the standard of medical care.

## 2018-02-26 ENCOUNTER — Ambulatory Visit: Payer: BLUE CROSS/BLUE SHIELD | Admitting: Physician Assistant

## 2018-02-26 ENCOUNTER — Encounter: Payer: Self-pay | Admitting: Physician Assistant

## 2018-02-26 VITALS — BP 146/91 | HR 72 | Resp 15 | Ht 71.0 in | Wt 240.0 lb

## 2018-02-26 DIAGNOSIS — R5383 Other fatigue: Secondary | ICD-10-CM

## 2018-02-26 DIAGNOSIS — D75A Glucose-6-phosphate dehydrogenase (G6PD) deficiency without anemia: Secondary | ICD-10-CM

## 2018-02-26 DIAGNOSIS — D550 Anemia due to glucose-6-phosphate dehydrogenase [G6PD] deficiency: Secondary | ICD-10-CM

## 2018-02-26 DIAGNOSIS — M3219 Other organ or system involvement in systemic lupus erythematosus: Secondary | ICD-10-CM

## 2018-02-26 DIAGNOSIS — J849 Interstitial pulmonary disease, unspecified: Secondary | ICD-10-CM | POA: Diagnosis not present

## 2018-02-26 DIAGNOSIS — E559 Vitamin D deficiency, unspecified: Secondary | ICD-10-CM

## 2018-02-26 DIAGNOSIS — M24522 Contracture, left elbow: Secondary | ICD-10-CM

## 2018-02-26 DIAGNOSIS — Z79899 Other long term (current) drug therapy: Secondary | ICD-10-CM

## 2018-02-26 DIAGNOSIS — M170 Bilateral primary osteoarthritis of knee: Secondary | ICD-10-CM

## 2018-02-26 DIAGNOSIS — R768 Other specified abnormal immunological findings in serum: Secondary | ICD-10-CM

## 2018-02-26 DIAGNOSIS — B372 Candidiasis of skin and nail: Secondary | ICD-10-CM

## 2018-02-26 MED ORDER — PREDNISONE 1 MG PO TABS: ORAL_TABLET | ORAL | 0 refills | Status: DC

## 2018-02-26 MED ORDER — PREDNISONE 5 MG PO TABS: ORAL_TABLET | ORAL | 0 refills | Status: DC

## 2018-02-26 MED ORDER — CLOTRIMAZOLE 1 % EX CREA: 1.0000 "application " | TOPICAL_CREAM | Freq: Two times a day (BID) | CUTANEOUS | 0 refills | Status: DC

## 2018-03-02 LAB — CBC WITH DIFFERENTIAL/PLATELET
BASOS ABS: 11 {cells}/uL (ref 0–200)
Basophils Relative: 0.3 %
EOS ABS: 49 {cells}/uL (ref 15–500)
EOS PCT: 1.3 %
HCT: 32.3 % — ABNORMAL LOW (ref 35.0–45.0)
Hemoglobin: 10.5 g/dL — ABNORMAL LOW (ref 11.7–15.5)
Lymphs Abs: 1239 cells/uL (ref 850–3900)
MCH: 30.2 pg (ref 27.0–33.0)
MCHC: 32.5 g/dL (ref 32.0–36.0)
MCV: 92.8 fL (ref 80.0–100.0)
MPV: 11.7 fL (ref 7.5–12.5)
Monocytes Relative: 7.3 %
NEUTROS PCT: 58.5 %
Neutro Abs: 2223 cells/uL (ref 1500–7800)
PLATELETS: 240 10*3/uL (ref 140–400)
RBC: 3.48 10*6/uL — ABNORMAL LOW (ref 3.80–5.10)
RDW: 12.6 % (ref 11.0–15.0)
TOTAL LYMPHOCYTE: 32.6 %
WBC mixed population: 277 cells/uL (ref 200–950)
WBC: 3.8 10*3/uL (ref 3.8–10.8)

## 2018-03-02 LAB — URINALYSIS, ROUTINE W REFLEX MICROSCOPIC
BILIRUBIN URINE: NEGATIVE
GLUCOSE, UA: NEGATIVE
Hgb urine dipstick: NEGATIVE
Ketones, ur: NEGATIVE
LEUKOCYTES UA: NEGATIVE
Nitrite: NEGATIVE
PH: 6.5 (ref 5.0–8.0)
PROTEIN: NEGATIVE
Specific Gravity, Urine: 1.021 (ref 1.001–1.03)

## 2018-03-02 LAB — COMPLETE METABOLIC PANEL WITH GFR
AG Ratio: 1.1 (calc) (ref 1.0–2.5)
ALT: 18 U/L (ref 6–29)
AST: 19 U/L (ref 10–35)
Albumin: 4.2 g/dL (ref 3.6–5.1)
Alkaline phosphatase (APISO): 71 U/L (ref 33–115)
BUN: 13 mg/dL (ref 7–25)
CALCIUM: 9.2 mg/dL (ref 8.6–10.2)
CHLORIDE: 103 mmol/L (ref 98–110)
CO2: 26 mmol/L (ref 20–32)
Creat: 0.73 mg/dL (ref 0.50–1.10)
GFR, EST AFRICAN AMERICAN: 115 mL/min/{1.73_m2} (ref >=60)
GFR, EST NON AFRICAN AMERICAN: 99 mL/min/{1.73_m2} (ref >=60)
GLUCOSE: 75 mg/dL (ref 65–99)
Globulin: 3.8 g/dL (calc) — ABNORMAL HIGH (ref 1.9–3.7)
POTASSIUM: 3.7 mmol/L (ref 3.5–5.3)
Sodium: 136 mmol/L (ref 135–146)
TOTAL PROTEIN: 8 g/dL (ref 6.1–8.1)
Total Bilirubin: 0.3 mg/dL (ref 0.2–1.2)

## 2018-03-02 LAB — C3 AND C4
C3 COMPLEMENT: 135 mg/dL (ref 83–193)
C4 Complement: 33 mg/dL (ref 15–57)

## 2018-03-02 LAB — SEDIMENTATION RATE: Sed Rate: 36 mm/h — ABNORMAL HIGH (ref 0–20)

## 2018-03-02 LAB — ANTI-NUCLEAR AB-TITER (ANA TITER)

## 2018-03-02 LAB — ANTI-DNA ANTIBODY, DOUBLE-STRANDED: ds DNA Ab: <1 IU/mL

## 2018-03-02 LAB — ANA: Anti Nuclear Antibody(ANA): POSITIVE — AB

## 2018-03-02 NOTE — Progress Notes (Signed)
CBC reveals findings consistent with anemia.  She is taking Ferrous sulfate.  Please  notify patient and forward results to PCP.  UA normal. Complements normal. DsDNA normal.  Sed rate stable. ANA titer stable.   She should continue on current treatment regimen.  She was given a prednisone taper at her last visit.  Please advise patient to notify us if she develops any new or worsening symptoms upon completion of prednisone taper.

## 2018-03-08 ENCOUNTER — Ambulatory Visit (INDEPENDENT_AMBULATORY_CARE_PROVIDER_SITE_OTHER): Payer: BLUE CROSS/BLUE SHIELD | Admitting: Internal Medicine

## 2018-03-08 ENCOUNTER — Encounter: Payer: Self-pay | Admitting: Internal Medicine

## 2018-03-08 VITALS — BP 126/74 | HR 101 | Ht 69.48 in | Wt 236.8 lb

## 2018-03-08 DIAGNOSIS — R42 Dizziness and giddiness: Secondary | ICD-10-CM | POA: Diagnosis not present

## 2018-03-08 DIAGNOSIS — D649 Anemia, unspecified: Secondary | ICD-10-CM

## 2018-03-08 DIAGNOSIS — J849 Interstitial pulmonary disease, unspecified: Secondary | ICD-10-CM | POA: Diagnosis not present

## 2018-03-08 DIAGNOSIS — Z23 Encounter for immunization: Secondary | ICD-10-CM | POA: Diagnosis not present

## 2018-03-08 DIAGNOSIS — R5383 Other fatigue: Secondary | ICD-10-CM

## 2018-03-08 DIAGNOSIS — Z79899 Other long term (current) drug therapy: Secondary | ICD-10-CM | POA: Diagnosis not present

## 2018-03-08 LAB — PULMONARY FUNCTION TEST
FEF 25-75 Pre: 2.45 L/sec
FEF2575-%Pred-Pre: 80 %
FEV1-%Pred-Pre: 70 %
FEV1-PRE: 2.07 L
FEV1FVC-%Pred-Pre: 103 %
FEV6-%PRED-PRE: 68 %
FEV6-Pre: 2.46 L
FEV6FVC-%PRED-PRE: 102 %
FVC-%PRED-PRE: 67 %
FVC-Pre: 2.46 L
Pre FEV1/FVC ratio: 84 %
Pre FEV6/FVC Ratio: 100 %

## 2018-03-08 NOTE — Patient Instructions (Addendum)
ILD (interstitial lung disease) (HCC) High risk medication use  - clinically pft better - continue cellcept and prednisone as before; apply sunscreen - flu shot  03/08/2018   Dizziness/fatigue - possible gabapentin and your oxycodeone is playing a role - slowly taper and stop your gabapentin over next few weeks  Anemia, unspecified type - unclear cause. Dropped from baesline 11.5gm% to 10.5gm% in sept 2019  - continue iron - recheck CBC in 1 month; if still droppin might have to considedr cellcept as a cause   Followup 3 months to ILD clininic  - simple (not ) at followup

## 2018-03-08 NOTE — Progress Notes (Signed)
Spirometry with graph completed today. 03/08/18

## 2018-03-08 NOTE — Addendum Note (Signed)
Addended by: Wyvonne Lenz on: 03/08/2018 11:15 AM   Modules accepted: Orders

## 2018-03-08 NOTE — Progress Notes (Signed)
PCP [name],[name], PA-C Referred by Dr Bo Merino  HPI   IOV 08/23/2014 [name]    Pt referred by Dr. Estanislado Pandy for ILD. Pt c/o SOB with activity and rest, dry cough and chest tightness also with and without activity.    47 year old female referred for evaluation of autoimmune interstitial lung disease. She presents with her husband. In 2010 while living in [place] she reports she was diagnosed with lupus associated with rheumatoid arthritis in her joints. In 2012 she moved to live in [facility] and several months after that started noticing insidious onset of shortness of breath. Local rheumatologist diagnosed her with interstitial lung disease. She was referred to Dr. Earnest Conroy at [facility] in [facility] and was started on CellCept/prednisone for autoimmune interstitial lung disease he and however, sometime around 2 years ago she ran out of medical insurance and stopped taking these medications. During this time her dyspnea has progressed. It is currently rated as moderate to severe. It is present on exertion and relieved by rest. Even minimal amount of exertion makes her very ddyspneic. Now she has insurance and she did see Dr. Rosann Auerbach locally and she has autoimmune panel lab ordered. In addition exam revealed crackles and there for she's been referred here for reevaluation of interstitial lung disease and dyspnea. Dyspnea is associated with some chest tightness but no chest pain. This no associated dizzine   11/24/2014 Follow UP ILD  [name]  She has autoimmune ILD with RA/Lupus  She was seen 6 weeks ago, restarted on Cellcept.  Previously on cellcept but lost her insurance until recently.  On low dose prednisone 5mg  daily .  Last CT chest 4/4 showed ILD changes similar to 2013. Echo was ok with EFG 55-60%, nml PAP . In  March .   Did not take dapsone ,due to  GP6D deficiency.  Labs ok last week with nml LFT , no sign change in hbg. /wbc.  She is feeling better. Does feel her breathing is some better.  No flare of cough or wheezing.     OV 01/16/2015  Chief Complaint  Patient presents with  . Follow-up    Pt c/o DOE, mild dry cough, and chest tightness when SOB. Pt states the chest tightness has improved).    follow-up interstitial lung disease in the setting of rheumatoid arthritis Follow-up high risk medication use - on CellCept and prednisone since mid April 2016  - Presents with her husband. Both give a history. Overall she is doing better in terms of dyspnea after starting CellCept and prednisone. However the improvement this only moderate. She still left with a residual moderate dyspnea on exertion that is also made worse with bending or heart air and improved with rest and cool air. It is associated with some cough and wheezing. She takes albuterol inhaler which she feels helps only somewhat but not fully and not quickly enough. She is frustrated by this. In addition she's complaining of some associated right lower back paraspinal spasm for which massage gives her relief. He has never attended pulmonary rehabilitation.  - Lab review shows she has had problems with potassium and has had potassium supplementation. Last lab check was 12/08/2014. She is due for lab test right now   OV 03/28/2015   Chief Complaint  Patient presents with  .  Follow-up    3 month follow up. Pt states that she is still having some problems with her breathing. Pt c/o of feeling chest tightness, chest pain and cough that is dry. Pt denies wheeze. Pt states that she did trial the Advair and does feel that it helped some.   Follow-up interstitial lung disease secondary to autoimmune process and associated dyspnea that seems out of proportion\  She presents with her husband. She continues on CellCept and prednisone. In the  last visit approximately 2-3 months ago she had out of proportion dyspnea. I gave her some Advair to try. She says this only helped a little bit. Overall she says that dyspnea still persists. It is worse than in the spring 2016 when she started CellCept and prednisone. It is stable since July 2016. It is moderate in intensity. Occurs randomly at rest but also with exertion. Occasionally relieved by rest but also happens at rest. Heat and humidity make it worse. Advair helped a little bit only. This no associated chest tightness or wheezing. She did try and enroll  in pulmonary rehabilitation but could not afford the the startup program and is waiting to hear from them for the maintenance program. She is frustrated by all the symptoms.  Pulmonary function test October 15,016 today FVC 2.55 L/64%, total lung capacity 4 L/65% and diffusion 14.5/42%. Overall no change since April 2016   OV 07/04/2015  Chief Complaint  Patient presents with  . Follow-up    pt. states breathing is baseline. SOB. dry cough. wheezing. occ. chest pain/tightness. feels her airway is blocked.   Follow-up interstitial lung disease admitted autoimmune processes and associated out of proportion dyspnea  She presents again with her husband. She continues on CellCept 2 g twice daily associated with prednisone. She cannot take Bactrim or dapsone due to G6PD deficiency. Last seen in the fall of 2016. She was having out of proportion dyspnea. Rated cardia pulmonary stress test and she could not tolerate this test. She then underwent right heart catheterization mid-November 2016 with Dr. Marca Ancona. Review the tests this is normal. Overall stable but she and husband still continued to complain about this resting dyspnea associated with wheezing. They hear noises in the chest. Sometime she gasps for air even at rest. She feels it comes from the chest but the husband points to the throat. I offered second opinion at [facility]  because of this unusual symptoms but they declined citing distance. She was supposed to attend pulmonary rehabilitation but they cannot afford a $60 co-pay twice a week 8 weeks. Offered ENT evaluation that agreeable but they wanted done in  which is closer logistically. In addition patient is contemplating now applying for disability. 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  Walking desaturation test 185 feet 3 laps and rheumatic: No desaturation    OV 10/12/2015  Chief Complaint  Patient presents with  . Follow-up    PFT today.  breathing is better.  Pt is currently on STD, [employer] approved STD until today, wants to know today if pt is able to return to work, any restrictions.  Pt states that she has been more stable while out of work.      Follow-up interstitial lung disease due to autoimmune processes not otherwise specified  Last seen January 2017. Since then she has been on short-term disability and out of work. She does heavy manual work. The lack of work estimated dyspnea better. She did pulmonary function test today  that I personally visualized and overall it is same. There are no new issues. She is on CellCept and she is tolerating this fine. Last visit she had some anemia we follow this up and the anemia was better. Also hypokalemia resolved by February 2017. She is due for blood work today. 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 This showed up at last visit. She saw an ENT in [place] within referred her to [facility]. She does not want to go to [facility]. She ended up seeing Dr. Ezzard Standing locally. But now she is going to see Dr.  Dina Rich [facility]   Pulmonary function test today 10/12/2015 shows postbronchodilator FVC 2.65 L/67%. FEV1 2.34 L/72% which is up 17% positive bronchodilator response. Ratio of 80/106%. Total lung capacity of 3.8/62%. DLCO  18.36/52%. Overall consistent with restriction and lung parenchymal disease. Overall PFTs are stable compared to April 2016 but perhaps in DLCO slightly better   OV 04/15/2016  Chief Complaint  Patient presents with  . Interstitial Lung Disease    Breathing is unchanged since last OV. Reports SOB, coughing. Cough is non productive. Denies chest tightness or wheezing.   Follow-up interstitial lung disease due to autoimmune processes not otherwise specified. On CellCept and prednisone. Not on Bactrim prophylaxis due to G6PD deficiency  Last visit April 2017. At that time based on pulmonary function tests showed stability and ILD for a year. 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 She did see local ENT doctor [place] and apparently has been reassured. At this point in time she is applying for long-term disability at work but the work discharged from services and she is now applying for Social Security disability. Her dyspnea stable since the interim. She's also had hysterectomy but for the last few weeks her cough is worse and it is dry. This no fever. Is no weight loss or chills. She says she's compliant with her CellCept and prednisone.  OV 05/29/2016  Chief Complaint  Patient presents with  . Follow-up    Pt states she still has harsh dry cough, pt states her SOB is unchanged and chest tightness when SOB or when coughing a lot. Pt deneis f/c/s.     Follow-up interstitial lung disease due to autoimmune processes not otherwise specified. On CellCept and prednisone since April 2016. Not on Bactrim prophylaxis due to G6PD deficiency  Sundai returns for follow-up. She had high resolution CT chest that shows stability and interstitial lung disease since April 2016. She Pulmicort function test that shows mild improvement since April 2016. Therefore it appears that her CellCept and prednisone is helping her she'll lung disease related to collagen-vascular disease.  However she tells me that she continues to have chronic cough for the last few to several months. It is progressive. It is dry. It is worse than her baseline. The dyspnea is unchanged. This no fever or weight loss or chills   OV 07/24/2016  Chief Complaint  Patient presents with  . Follow-up    Pt states she feels the Arnuity has been helping her breathing. Pt c/o dry cough and occ chest tightness.   rec    ICD-9-CM ICD-10-CM   1. ILD (interstitial lung disease) (HCC) 515 J84.9   2. Chronic cough 786.2 R05   3. High risk medication use V58.69 Z79.899    Clinically improved with cellcept/prednisone and arnuity  Blood work ok dec 2017  pLAN - start pulm rehab at [facility] - continue  aruity daily - continue cellcept and prednisone  Followup - 6 months do Pre-bd spiro and dlco only. No lung volume or bd response. No post-bd spiro - 6 months fu Dr Marchelle Gearing after above or sooner if needed     12/15/2016  f/u ov/Wert re: cough dry on arnuity  Chief Complaint  Patient presents with  . Follow-up    Breathing is unchanged since her last visit. Pt states she is here to f/u on recent ABG result. She c/o feeling tired all of the time. She has occ cough- non prod.       Finished at Rehab   beginning  Of May  2018 and trouble walking fast or uphill Waking up with ha's since rehab sev days a week  Cough is new x one month mostly dry and day > noct   No obvious day to day or daytime variability or assoc excess/ purulent sputum or mucus plugs or hemoptysis or cp or chest tightness, subjective wheeze or overt sinus or hb symptoms. No unusual exp hx or h/o childhood pna/ asthma or knowledge of premature birth.   OV 02/13/2017  Chief Complaint  Patient presents with  . Follow-up    cough/ILD follow up - prod cough with brownish/green mucus with tightness and chest pain x2 days.  denies any f/c/s, hemoptysis.  PFT done today.    Follow-up interstitial lung disease due to autoimmune  processes not otherwise specified. On CellCept and prednisone since April 2016. Not on Bactrim prophylaxis due to G6PD deficiency  47 year old female immunosuppressed with CellCept and prednisone. Not seen her in many months. Currently taking prednisone 3 mg per day and CellCept 1000 mg twice daily. She cannot do Bactrim prophylaxis because of G6PD deficiency. She tells me that overall her health has been stable but in the last few weeks noticing increased shortness of breath in the last few days this increased cough and change in color of sputum to green and increased chest tightness and a feeling that she is getting acute bronchitis. She also was contemplated going to the emergency room a few days ago but now she is better. There is no obvious fever or chills or hemoptysis or edema paroxysmal nocturnal dyspnea or orthopnea. Pulmonary function test today shows 10% decline in FVC and DLCO compared to baseline.   OV 06/08/2017  Chief Complaint  Patient presents with  . Follow-up    Feeling about the same as last visit. Still having chest tightness and wheezing at times, Sounds hoarse.     Follow-up interstitial lung disease due to autoimmune processes not otherwise specified. On CellCept and prednisone since April 2016. Not on Bactrim prophylaxis due to G6PD deficiency. Normal Right heart cath Nov 2016.  Last high-resolution CT November 2017  Last visit August 2018.  There is a routine follow-up.  Overall she feels stable.  FVC shows stability since August 2018 but declined since 1 year ago.  DLCO shows continued decline.  Though her lung health is stable.  She says she lost her 1 only biological sister last week due to lupus.  The sister was only 41 and lived in [place].  There are no new issues. Walking desaturation test on 06/08/2017 185 feet x 3 laps on ROOM AIR:  did not desaturate. Rest pulse ox was 100%%, final pulse ox was 100%. HR response 81/min at rest to 109/min at peak exertion.  Patient  Shannon [name]  Did not Desaturate < 88% . Shannon [name] did nto  Desaturated </= 3% points. CHIA MOWERS yes did get tachyardic   OV 10/15/2017   Follow-up interstitial lung disease due to autoimmune processes not otherwise specified. On CellCept and prednisone since April 2016. Not on Bactrim prophylaxis due to G6PD deficiency. Normal Right heart cath Nov 2016 and feb 2019.  Last high-resolution CT November 2017 and dec 2018 without progresion   Last visit December 2018.  This is a routine follow-up.  In the interim she had high-resolution CT scan of the chest that did not show any progression in interstitial lung disease between 2017 and 2018.Marland Kitchen  Therefore we did an echocardiogram that showed slight elevation in pulmonary artery systolic pressure.  Therefore we sent it to her repeat right heart catheterization and this was normal as documented below.  Overall she feels stable compared to one year ago but has declined compared to 2 years ago.  She is also complaining about a new recurrence of cough that happens mostly at night despite Symbicort and prednisone and CellCept.  It wakes her up.  It is moderate in intensity.  There is no associated wheezing or edema orthopnea.  It happens randomly.  There is associated heartburn.  She is on nothing for acid reflux.       Right Heart Pressures 08/20/17 [facility] Procedural Findings: Hemodynamics (mmHg) RA mean 3 RV 30/6 PA 23/8, mean 14 PCWP mean 8  Oxygen saturations: PA 72% AO 98%  Cardiac Output (Fick) 6.73  Cardiac Index (Fick) 3.06  Cardiac Output (Thermo) 6.94 Cardiac Index (Thermo) 3.15    OV 12/01/2017  Chief Complaint  Patient presents with  . Follow-up    Pt has SOB with exertion, wheezing, some chest tightness. Pt was dry cough more at bedtime.      Follow-up interstitial lung disease due to autoimmune processes not otherwise specified. On CellCept and prednisone since April 2016. Not on Bactrim prophylaxis due to  G6PD deficiency. Normal Right heart cath Nov 2016 and feb 2019.  Last high-resolution CT November 2017 and dec 2018 without progresion   This visit is to see if she is got progressive interstitial lung disease.  Last visit she was complaining of more cough.  Therefore we added some acid reflux treatment.  She says after the acid reflux treatment symptoms have actually improved.  This contradicts what she told the CMA at intake.  Overall she is feeling stable.  However she does have a new episode of orthostatic dizziness going on at a mild level for the last 1 week.  Today after doing pulmonary function test she did feel dizzy.  Therefore we checked orthostatics on her and she got tachycardic standing up and her blood pressure did drop although still within normal limits.  She does not have any chest pain.  Lung function test shows continued stability in the last 1 year including compared to the most recent attempt.  OV 03/08/2018  Subjective:  Patient ID: Shannon [name], female , DOB: 07-26-1970 , age 60 y.o. , MRN: [id number] , ADDRESS: [address]  [place] [facility] [id number]   03/08/2018 -   Chief Complaint  Patient presents with  . Follow-up    Spirometry performed today. Pt states she is still having some mild problems with dizziness but not as bad as last visit. Pt also states she has been having problems with headaches x1 month. Pt states her breathing is about the same  as last visit and also states she still has the dry cough.    Follow-up interstitial lung disease due to autoimmune processes not otherwise specified. On CellCept and prednisone since April 2016. Not on Bactrim prophylaxis due to G6PD deficiency. Normal Right heart cath Nov 2016 and feb 2019.  Last high-resolution CT November 2017 and dec 2018 without progresion     HPI Shannon [name] 47 y.o. - connective tissue disease ILD. Follow-up. Last seen June 2019 by she started having new onset dizziness. It seemed orthostatic. She  says since then it is spontaneously improved but still persists. She is also dealing with fatigue issues. In 02/26/2018 she felt she had a lupus flare saw Dr. Algis Downs and given a prednisone burst. Autoimmune profile at that time showed continued positive ANA titer but normal complements. She had a CBC that showed a drop in hemoglobin by 1 g percent. Her baseline is around 11.5 g percent and currently it is around 10.5 g percent. She says after the prednisone burst a lupus flare symptoms and fatigue have improved although it still persists. If it is actually worse in the last few months. She had pulmonary function test that shows continued improvement especially following the recent prednisone burst. Walking desaturation test was normal other than tachycardia. We observed her to be walking very slowly in a very deconditioned and fatigued fashion. 02/26/2089 and liver function and renal function reviewed and this was normal. Medication review shows she is on oxycodone and gabapentin for back pain. She says she's been on gabapentin for the last 1 year. It appears that the temporal sequence of fatigue and dizziness might be related to gabapentin but she is not so sure.      Results for KARINE, GARN (MRN [id number]) as of 02/13/2017 12:16  Ref. Range 10/09/2014 09:39 03/28/2015 11:40 10/12/2015 13:42 05/06/2016 10:38 02/13/2017 10:49 06/08/2017  12/01/2017  03/08/2018   FVC-Pre Latest Units: L 2.54 2.55 2.53 2.63 2.32 2.31 2.38 2.46  FVC-%Pred-Pre Latest Units: % 64 64 64 67 59 59% 61% 67%   Results for DAMEISHA, TSCHIDA (MRN [id number]) as of 02/13/2017 12:16  Ref. Range 10/09/2014 09:39 03/28/2015 11:40 10/12/2015 13:42 05/06/2016 10:38 02/13/2017 10:49 06/08/2017  12/01/2017  03/08/2018   DLCO unc Latest Units: ml/min/mmHg 15.03 14.50 18.36 18.31 16.35 14.21 16.38 x  DLCO unc % pred Latest Units: % 43 42 53 53 47 41 47% x       Simple office walk 185 feet x  3 laps goal with forehead probe 03/08/2018   O2 used  Room air  Number laps completed 3  Comments about pace Slow pace  Resting Pulse Ox/HR 100% and 96/min  Final Pulse Ox/HR 100% and 112/min  Desaturated </= 88% n  Desaturated <= 3% points no  Got Tachycardic >/= 90/min yes  Symptoms at end of test Mild fatigue  Miscellaneous comments Very slow pace   Results for MYALEE, STENGEL (MRN [id number]) as of 03/08/2018 10:51  Ref. Range 02/26/2018 10:53  Hemoglobin Latest Ref Range: 11.7 - 15.5 g/dL 67.1 (L)  Results for JUSTYN, BOYSON (MRN [id number]) as of 03/08/2018 10:51  Ref. Range 02/26/2018 10:53  Creatinine Latest Ref Range: 0.50 - 1.10 mg/dL 3.82  Results for CEYDA, PETERKA (MRN [id number]) as of 03/08/2018 10:51  Ref. Range 02/26/2018 10:53  AST Latest Ref Range: 10 - 35 U/L 19  ALT Latest Ref Range: 6 - 29 U/L 18   Results for SALEHA, KALP (MRN [id number]) as  of 03/08/2018 10:51  Ref. Range 02/26/2018 10:53  Anti Nuclear Antibody(ANA) Latest Ref Range: NEGATIVE  POSITIVE (A)  ANA Pattern 1 Unknown SPECKLED (A)  ANA Titer 1 Latest Units: titer > OR = 1:1280 (A)  ds DNA Ab Latest Units: IU/mL <1  C3 Complement Latest Ref Range: 83 - 193 mg/dL 324  C4 Complement Latest Ref Range: 15 - 57 mg/dL 33    ROS - per HPI     has a past medical history of Anemia, G6PD deficiency (HCC), ILD (interstitial lung disease) (HCC), Lupus (HCC), and Rheumatoid arthritis (HCC).   reports that she quit smoking about 3 years ago. Her smoking use included cigarettes. She has a 7.00 pack-year smoking history. She has never used smokeless tobacco.  Past Surgical History:  Procedure Laterality Date  . APPENDECTOMY  1980  . CARDIAC CATHETERIZATION N/A 05/10/2015   Procedure: Right Heart Cath;  Surgeon: Laurey Morale, MD;  Location: [facility] INVASIVE CV LAB;  Service: Cardiovascular;  Laterality: N/A;  . CESAREAN SECTION  '95, '02, '07   X 3  . CHOLECYSTECTOMY  2010  . LAPAROSCOPIC HYSTERECTOMY  10/2015   have ovaries  . RIGHT HEART CATH N/A 08/20/2017    Procedure: RIGHT HEART CATH;  Surgeon: Laurey Morale, MD;  Location: [facility] INVASIVE CV LAB;  Service: Cardiovascular;  Laterality: N/A;  . TUBAL LIGATION      Allergies  Allergen Reactions  . Asa [Aspirin] Hives and Shortness Of Breath    Chest tightness   . Penicillins Hives    Has patient had a PCN reaction causing immediate rash, facial/tongue/throat swelling, SOB or lightheadedness with hypotension: Unknown Has patient had a PCN reaction causing severe rash involving mucus membranes or skin necrosis: Unknown Has patient had a PCN reaction that required hospitalization: Unknown Has patient had a PCN reaction occurring within the last 10 years: No If all of the above answers are "NO", then may proceed with Cephalosporin use.   . Sulfa Antibiotics     G6PD deficiency     Immunization History  Administered Date(s) Administered  . Influenza Split 03/23/2014  . Influenza,inj,Quad PF,6+ Mos 03/15/2016, 06/08/2017  . Pneumococcal-Unspecified 08/23/2014    Family History  Problem Relation Age of Onset  . Sarcoidosis Mother   . Lupus Sister   . Healthy Daughter   . Healthy Son   . Healthy Son      Current Outpatient Medications:  .  albuterol (PROVENTIL HFA;VENTOLIN HFA) 108 (90 Base) MCG/ACT inhaler, Inhale 2 puffs into the lungs every 6 (six) hours as needed for wheezing or shortness of breath., Disp: 1 Inhaler, Rfl: 5 .  buPROPion (WELLBUTRIN XL) 150 MG 24 hr tablet, TAKE 1 TABLET BY MOUTH IN THE MORNING FOR DEPRESSION, Disp: , Rfl: 1 .  clotrimazole (LOTRIMIN) 1 % cream, Apply 1 application topically 2 (two) times daily., Disp: 60 g, Rfl: 0 .  diclofenac sodium (VOLTAREN) 1 % GEL, Apply 2 g topically 4 (four) times daily as needed (pain). , Disp: , Rfl:  .  ferrous sulfate 325 (65 FE) MG tablet, Take 325 mg by mouth daily with breakfast. , Disp: , Rfl:  .  gabapentin (NEURONTIN) 300 MG capsule, Take 300 mg by mouth 3 (three) times daily., Disp: , Rfl:  .  ibuprofen  (ADVIL,MOTRIN) 200 MG tablet, Take 600 mg by mouth every 6 (six) hours as needed for moderate pain. , Disp: , Rfl:  .  magic mouthwash w/lidocaine SOLN, Take 5 mLs by mouth 3 (  three) times daily. Swish and spit, Disp: 100 mL, Rfl: 0 .  mycophenolate (CELLCEPT) 500 MG tablet, Take 2 tablets (1,000 mg total) by mouth 2 (two) times daily., Disp: 360 tablet, Rfl: 1 .  oxyCODONE (OXY IR/ROXICODONE) 5 MG immediate release tablet, Take 5 mg by mouth 2 (two) times daily as needed. , Disp: , Rfl:  .  predniSONE (DELTASONE) 5 MG tablet, Take 2 tablets by mouth daily for 1 week, take 1.5 tablets by mouth for 1 week, 1 tablet by mouth for 1 week, Disp: 32 tablet, Rfl: 0 .  sertraline (ZOLOFT) 50 MG tablet, daily., Disp: , Rfl: 0 .  SUMAtriptan (IMITREX) 100 MG tablet, TAKE 1 TABLET BY MOUTH AS DIRECTED. MAY REPEAT ONCE IF NEEDED. MAXIMUM OF 2 TABLETS PER DAY., Disp: , Rfl: 1 .  amLODipine (NORVASC) 5 MG tablet, Take 1 tablet (5 mg total) by mouth daily., Disp: 30 tablet, Rfl: 3      Objective:   Vitals:   03/08/18 1041  BP: 126/74  Pulse: (!) 101  SpO2: 99%  Weight: 236 lb 12.8 oz (107.4 kg)  Height: 5' 9.48" (1.765 m)    Estimated body mass index is 34.49 kg/m as calculated from the following:   Height as of this encounter: 5' 9.48" (1.765 m).   Weight as of this encounter: 236 lb 12.8 oz (107.4 kg).  @WEIGHTCHANGE @  [employer]   03/08/18 1041  Weight: 236 lb 12.8 oz (107.4 kg)     Physical Exam  General Appearance:    Alert, cooperative, no distress, appears stated age - no; looks older , sitting on - chair  Head:    Normocephalic, without obvious abnormality, atraumatic  Eyes:    PERRL, conjunctiva/corneas clear,  Ears:    Normal TM's and external ear canals, both ears  Nose:   Nares normal, septum midline, mucosa normal, no drainage    or sinus tenderness. OXYGEN ON  - no . Patient is @ ra   Throat:   Lips, mucosa, and tongue normal; teeth and gums normal. Cyanosis on lips - no    Neck:   Supple, symmetrical, trachea midline, no adenopathy;    thyroid:  no enlargement/tenderness/nodules; no carotid   bruit or JVD  Back:     Symmetric, no curvature, ROM normal, no CVA tenderness  Lungs:     Distress - no , Wheeze no, Barrell Chest - no, Purse lip breathing - no, Crackles - basal velcro   Chest Wall:    No tenderness or deformity. Scars in chest no   Heart:    Regular rate and rhythm, S1 and S2 normal, no murmur, rub   or gallop  Breast Exam:    NOT DONE  Abdomen:     Soft, non-tender, bowel sounds active all four quadrants,    no masses, no organomegaly  Genitalia:   NOT DONE  Rectal:   NOT DONE  Extremities:   Extremities normal, atraumatic, Clubbing - no, Edema - no  Pulses:   2+ and symmetric all extremities  Skin:   Stigmata of Connective Tissue Disease - no  Lymph nodes:   Cervical, supraclavicular, and axillary nodes normal  Psychiatric:  Neurologic:   Flat affect, fatigued look CNII-XII intact, normal strength, sensation  throughout           Assessment:       ICD-10-CM   1. ILD (interstitial lung disease) (HCC) J84.9   2. High risk medication use Z79.899   3. Dizziness  R42   4. Anemia, unspecified type D64.9   5. Other fatigue R53.83        Plan:     ILD (interstitial lung disease) (HCC) High risk medication use  - clinically pft better - continue cellcept and prednisone as before; apply sunscreen - flu shot  03/08/2018   Dizziness/fatigue - possible gabapentin and your oxycodeone is playing a role - slowly taper and stop your gabapentin over next few weeks  Anemia, unspecified type - unclear cause. Dropped from baesline 11.5gm% to 10.5gm% in sept 2019  - continue iron - recheck CBC in 1 month; if still droppin might have to considedr cellcept as a cause   Followup 3 months to ILD clininic  - simple (not ) at followup      SIGNATURE    Dr. Kalman Shan, M.D., F.C.C.P,  Pulmonary and Critical Care  Medicine Staff Physician, [facility] Director - Interstitial Lung Disease  Program  Pulmonary Fibrosis [facility] at [facility] [place], [place], [address]  Pager: [phone], If no answer or between  15:00h - 7:00h: call [phone] Telephone: [phone]  11:09 AM 03/08/2018

## 2018-04-05 ENCOUNTER — Other Ambulatory Visit (INDEPENDENT_AMBULATORY_CARE_PROVIDER_SITE_OTHER): Payer: BLUE CROSS/BLUE SHIELD

## 2018-04-05 ENCOUNTER — Telehealth: Payer: Self-pay | Admitting: Internal Medicine

## 2018-04-05 DIAGNOSIS — J849 Interstitial pulmonary disease, unspecified: Secondary | ICD-10-CM | POA: Diagnosis not present

## 2018-04-05 LAB — CBC WITH DIFFERENTIAL/PLATELET
Basophils Absolute: 0 10*3/uL (ref 0.0–0.1)
Basophils Relative: 0.7 % (ref 0.0–3.0)
EOS PCT: 2 % (ref 0.0–5.0)
Eosinophils Absolute: 0.1 10*3/uL (ref 0.0–0.7)
HEMATOCRIT: 33.1 % — AB (ref 36.0–46.0)
Hemoglobin: 10.9 g/dL — ABNORMAL LOW (ref 12.0–15.0)
LYMPHS ABS: 1.5 10*3/uL (ref 0.7–4.0)
LYMPHS PCT: 31.9 % (ref 12.0–46.0)
MCHC: 32.9 g/dL (ref 30.0–36.0)
MCV: 92.5 fl (ref 78.0–100.0)
Monocytes Absolute: 0.4 10*3/uL (ref 0.1–1.0)
Monocytes Relative: 7.9 % (ref 3.0–12.0)
NEUTROS ABS: 2.6 10*3/uL (ref 1.4–7.7)
NEUTROS PCT: 57.5 % (ref 43.0–77.0)
PLATELETS: 218 10*3/uL (ref 150.0–400.0)
RBC: 3.58 Mil/uL — AB (ref 3.87–5.11)
RDW: 12.9 % (ref 11.5–15.5)
WBC: 4.6 10*3/uL (ref 4.0–10.5)

## 2018-04-05 NOTE — Telephone Encounter (Signed)
Called and spoke with Patient.  Patient is requesting Ranatidine prescription to [facility] in [place].  Patient stated that they no longer have it OTC, because of recall.  Pharmacy advised her to have prescription called.  Patient aware that Dr. Marchelle Gearing will not be in the office until 04/06/18.  Will route to Dr Marchelle Gearing.   Dr. Marchelle Gearing, please advise

## 2018-04-06 MED ORDER — RANITIDINE HCL 300 MG PO TABS: 300.0000 mg | ORAL_TABLET | Freq: Every day | ORAL | 5 refills | Status: DC

## 2018-04-06 NOTE — Telephone Encounter (Signed)
Spoke with pt and advised rx sent to pharmacy. Nothing further is needed.   

## 2018-04-06 NOTE — Telephone Encounter (Signed)
Ok for ranitidine 300mg  qhs

## 2018-04-21 ENCOUNTER — Telehealth: Payer: Self-pay | Admitting: Rheumatology

## 2018-04-21 MED ORDER — PREDNISONE 1 MG PO TABS: 3.0000 mg | ORAL_TABLET | Freq: Every day | ORAL | 0 refills | Status: DC

## 2018-04-21 NOTE — Telephone Encounter (Signed)
Patient called requesting prescription refill of Prednisone to be sent to [facility] on [address] in [place].

## 2018-04-21 NOTE — Telephone Encounter (Signed)
Last Visit: 02/26/18 Next Visit: 08/02/18  Okay to refill per Dr. Corliss Skains

## 2018-05-13 ENCOUNTER — Telehealth: Payer: Self-pay | Admitting: Internal Medicine

## 2018-05-13 MED ORDER — FAMOTIDINE 20 MG PO TABS: 20.0000 mg | ORAL_TABLET | Freq: Every day | ORAL | 2 refills | Status: DC

## 2018-05-13 NOTE — Telephone Encounter (Signed)
Called and spoke to pt, who is requesting alternative for Zantac, as this medication has been recalled.   MR please advise. Thanks

## 2018-05-13 NOTE — Telephone Encounter (Signed)
Have sent Rx of famotidine (pepcid) in to pt's pharmacy for her.  Called and spoke with pt letting her know this had been done. I made her aware that this is avail OTC so she can check to see which would be cheaper for her. Pt expressed understanding. Nothing further needed.

## 2018-05-13 NOTE — Telephone Encounter (Signed)
See if they have cimetidine 200mg  daily or famotidine 20mg  daily

## 2018-05-31 DIAGNOSIS — G894 Chronic pain syndrome: Secondary | ICD-10-CM | POA: Diagnosis not present

## 2018-05-31 DIAGNOSIS — M069 Rheumatoid arthritis, unspecified: Secondary | ICD-10-CM | POA: Diagnosis not present

## 2018-05-31 DIAGNOSIS — M2550 Pain in unspecified joint: Secondary | ICD-10-CM | POA: Diagnosis not present

## 2018-05-31 DIAGNOSIS — G4730 Sleep apnea, unspecified: Secondary | ICD-10-CM | POA: Diagnosis not present

## 2018-05-31 DIAGNOSIS — M329 Systemic lupus erythematosus, unspecified: Secondary | ICD-10-CM | POA: Diagnosis not present

## 2018-05-31 DIAGNOSIS — J45909 Unspecified asthma, uncomplicated: Secondary | ICD-10-CM | POA: Diagnosis not present

## 2018-05-31 DIAGNOSIS — M170 Bilateral primary osteoarthritis of knee: Secondary | ICD-10-CM | POA: Diagnosis not present

## 2018-05-31 DIAGNOSIS — Z79891 Long term (current) use of opiate analgesic: Secondary | ICD-10-CM | POA: Diagnosis not present

## 2018-06-01 ENCOUNTER — Other Ambulatory Visit: Payer: Self-pay | Admitting: Rheumatology

## 2018-06-01 NOTE — Telephone Encounter (Signed)
Last Visit: 02/26/18 Next Visit: 08/02/18  Okay to refill per Dr. Deveshwar 

## 2018-06-03 ENCOUNTER — Ambulatory Visit (INDEPENDENT_AMBULATORY_CARE_PROVIDER_SITE_OTHER): Payer: Medicare HMO | Admitting: Internal Medicine

## 2018-06-03 ENCOUNTER — Encounter: Payer: Self-pay | Admitting: Internal Medicine

## 2018-06-03 VITALS — BP 132/78 | HR 78 | Ht 69.0 in | Wt 249.0 lb

## 2018-06-03 DIAGNOSIS — J8489 Other specified interstitial pulmonary diseases: Secondary | ICD-10-CM | POA: Diagnosis not present

## 2018-06-03 DIAGNOSIS — Z5181 Encounter for therapeutic drug level monitoring: Secondary | ICD-10-CM

## 2018-06-03 DIAGNOSIS — J849 Interstitial pulmonary disease, unspecified: Secondary | ICD-10-CM | POA: Diagnosis not present

## 2018-06-03 DIAGNOSIS — D649 Anemia, unspecified: Secondary | ICD-10-CM | POA: Diagnosis not present

## 2018-06-03 DIAGNOSIS — J069 Acute upper respiratory infection, unspecified: Secondary | ICD-10-CM | POA: Diagnosis not present

## 2018-06-03 DIAGNOSIS — Z79899 Other long term (current) drug therapy: Secondary | ICD-10-CM | POA: Diagnosis not present

## 2018-06-03 DIAGNOSIS — M358 Other specified systemic involvement of connective tissue: Secondary | ICD-10-CM

## 2018-06-03 LAB — CBC WITH DIFFERENTIAL/PLATELET
BASOS PCT: 0.8 % (ref 0.0–3.0)
Basophils Absolute: 0 10*3/uL (ref 0.0–0.1)
EOS PCT: 1.3 % (ref 0.0–5.0)
Eosinophils Absolute: 0.1 10*3/uL (ref 0.0–0.7)
HCT: 34.1 % — ABNORMAL LOW (ref 36.0–46.0)
HEMOGLOBIN: 11.3 g/dL — AB (ref 12.0–15.0)
LYMPHS ABS: 1.2 10*3/uL (ref 0.7–4.0)
LYMPHS PCT: 27.8 % (ref 12.0–46.0)
MCHC: 33.1 g/dL (ref 30.0–36.0)
MCV: 92.2 fl (ref 78.0–100.0)
Monocytes Absolute: 0.2 10*3/uL (ref 0.1–1.0)
Monocytes Relative: 5.5 % (ref 3.0–12.0)
Neutro Abs: 2.9 10*3/uL (ref 1.4–7.7)
Neutrophils Relative %: 64.6 % (ref 43.0–77.0)
Platelets: 235 10*3/uL (ref 150.0–400.0)
RBC: 3.69 Mil/uL — ABNORMAL LOW (ref 3.87–5.11)
RDW: 13.4 % (ref 11.5–15.5)
WBC: 4.4 10*3/uL (ref 4.0–10.5)

## 2018-06-03 LAB — BASIC METABOLIC PANEL
BUN: 11 mg/dL (ref 6–23)
CO2: 31 meq/L (ref 19–32)
CREATININE: 0.68 mg/dL (ref 0.40–1.20)
Calcium: 9 mg/dL (ref 8.4–10.5)
Chloride: 103 mEq/L (ref 96–112)
GFR: 120.06 mL/min (ref >60.00)
GLUCOSE: 99 mg/dL (ref 70–99)
POTASSIUM: 3.3 meq/L — AB (ref 3.5–5.1)
SODIUM: 138 meq/L (ref 135–145)

## 2018-06-03 LAB — HEPATIC FUNCTION PANEL
ALT: 12 U/L (ref 0–35)
AST: 11 U/L (ref 0–37)
Albumin: 3.8 g/dL (ref 3.5–5.2)
Alkaline Phosphatase: 72 U/L (ref 39–117)
Bilirubin, Direct: 0.1 mg/dL (ref 0.0–0.3)
Total Bilirubin: 0.3 mg/dL (ref 0.2–1.2)
Total Protein: 7.5 g/dL (ref 6.0–8.3)

## 2018-06-03 MED ORDER — AZITHROMYCIN 250 MG PO TABS: ORAL_TABLET | ORAL | 0 refills | Status: AC

## 2018-06-03 MED ORDER — MYCOPHENOLATE MOFETIL 500 MG PO TABS: 1000.0000 mg | ORAL_TABLET | Freq: Two times a day (BID) | ORAL | 1 refills | Status: DC

## 2018-06-03 NOTE — Progress Notes (Signed)
PCP [name],[name], PA-C Referred by Dr Bo Merino  HPI   IOV 08/23/2014 [name]    Pt referred by Dr. Estanislado Pandy for ILD. Pt c/o SOB with activity and rest, dry cough and chest tightness also with and without activity.    47 year old female referred for evaluation of autoimmune interstitial lung disease. She presents with her husband. In 2010 while living in [place] she reports she was diagnosed with lupus associated with rheumatoid arthritis in her joints. In 2012 she moved to live in [facility] and several months after that started noticing insidious onset of shortness of breath. Local rheumatologist diagnosed her with interstitial lung disease. She was referred to Dr. Earnest Conroy at [facility] in [facility] and was started on CellCept/prednisone for autoimmune interstitial lung disease he and however, sometime around 2 years ago she ran out of medical insurance and stopped taking these medications. During this time her dyspnea has progressed. It is currently rated as moderate to severe. It is present on exertion and relieved by rest. Even minimal amount of exertion makes her very ddyspneic. Now she has insurance and she did see Dr. Rosann Auerbach locally and she has autoimmune panel lab ordered. In addition exam revealed crackles and there for she's been referred here for reevaluation of interstitial lung disease and dyspnea. Dyspnea is associated with some chest tightness but no chest pain. This no associated dizzine   11/24/2014 Follow UP ILD  [name]  She has autoimmune ILD with RA/Lupus  She was seen 6 weeks ago, restarted on Cellcept.  Previously on cellcept but lost her insurance until recently.  On low dose prednisone 5mg  daily .  Last CT chest 4/4 showed ILD changes similar to 2013. Echo was ok with EFG 55-60%, nml PAP . In  March .   Did not take dapsone ,due to  GP6D deficiency.  Labs ok last week with nml LFT , no sign change in hbg. /wbc.  She is feeling better. Does feel her breathing is some better.  No flare of cough or wheezing.     OV 01/16/2015  Chief Complaint  Patient presents with  . Follow-up    Pt c/o DOE, mild dry cough, and chest tightness when SOB. Pt states the chest tightness has improved).    follow-up interstitial lung disease in the setting of rheumatoid arthritis Follow-up high risk medication use - on CellCept and prednisone since mid April 2016  - Presents with her husband. Both give a history. Overall she is doing better in terms of dyspnea after starting CellCept and prednisone. However the improvement this only moderate. She still left with a residual moderate dyspnea on exertion that is also made worse with bending or heart air and improved with rest and cool air. It is associated with some cough and wheezing. She takes albuterol inhaler which she feels helps only somewhat but not fully and not quickly enough. She is frustrated by this. In addition she's complaining of some associated right lower back paraspinal spasm for which massage gives her relief. He has never attended pulmonary rehabilitation.  - Lab review shows she has had problems with potassium and has had potassium supplementation. Last lab check was 12/08/2014. She is due for lab test right now   OV 03/28/2015   Chief Complaint  Patient presents with  .  Follow-up    3 month follow up. Pt states that she is still having some problems with her breathing. Pt c/o of feeling chest tightness, chest pain and cough that is dry. Pt denies wheeze. Pt states that she did trial the Advair and does feel that it helped some.   Follow-up interstitial lung disease secondary to autoimmune process and associated dyspnea that seems out of proportion\  She presents with her husband. She continues on CellCept and prednisone. In the  last visit approximately 2-3 months ago she had out of proportion dyspnea. I gave her some Advair to try. She says this only helped a little bit. Overall she says that dyspnea still persists. It is worse than in the spring 2016 when she started CellCept and prednisone. It is stable since July 2016. It is moderate in intensity. Occurs randomly at rest but also with exertion. Occasionally relieved by rest but also happens at rest. Heat and humidity make it worse. Advair helped a little bit only. This no associated chest tightness or wheezing. She did try and enroll  in pulmonary rehabilitation but could not afford the the startup program and is waiting to hear from them for the maintenance program. She is frustrated by all the symptoms.  Pulmonary function test October 15,016 today FVC 2.55 L/64%, total lung capacity 4 L/65% and diffusion 14.5/42%. Overall no change since April 2016   OV 07/04/2015  Chief Complaint  Patient presents with  . Follow-up    pt. states breathing is baseline. SOB. dry cough. wheezing. occ. chest pain/tightness. feels her airway is blocked.   Follow-up interstitial lung disease admitted autoimmune processes and associated out of proportion dyspnea  She presents again with her husband. She continues on CellCept 2 g twice daily associated with prednisone. She cannot take Bactrim or dapsone due to G6PD deficiency. Last seen in the fall of 2016. She was having out of proportion dyspnea. Rated cardia pulmonary stress test and she could not tolerate this test. She then underwent right heart catheterization mid-November 2016 with Dr. Marca Ancona. Review the tests this is normal. Overall stable but she and husband still continued to complain about this resting dyspnea associated with wheezing. They hear noises in the chest. Sometime she gasps for air even at rest. She feels it comes from the chest but the husband points to the throat. I offered second opinion at [facility]  because of this unusual symptoms but they declined citing distance. She was supposed to attend pulmonary rehabilitation but they cannot afford a $60 co-pay twice a week 8 weeks. Offered ENT evaluation that agreeable but they wanted done in  which is closer logistically. In addition patient is contemplating now applying for disability. 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  Walking desaturation test 185 feet 3 laps and rheumatic: No desaturation    OV 10/12/2015  Chief Complaint  Patient presents with  . Follow-up    PFT today.  breathing is better.  Pt is currently on STD, [employer] approved STD until today, wants to know today if pt is able to return to work, any restrictions.  Pt states that she has been more stable while out of work.      Follow-up interstitial lung disease due to autoimmune processes not otherwise specified  Last seen January 2017. Since then she has been on short-term disability and out of work. She does heavy manual work. The lack of work estimated dyspnea better. She did pulmonary function test today  that I personally visualized and overall it is same. There are no new issues. She is on CellCept and she is tolerating this fine. Last visit she had some anemia we follow this up and the anemia was better. Also hypokalemia resolved by February 2017. She is due for blood work today. 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 This showed up at last visit. She saw an ENT in [place] within referred her to [facility]. She does not want to go to [facility]. She ended up seeing Dr. Ezzard Standing locally. But now she is going to see Dr.  Dina Rich [facility]   Pulmonary function test today 10/12/2015 shows postbronchodilator FVC 2.65 L/67%. FEV1 2.34 L/72% which is up 17% positive bronchodilator response. Ratio of 80/106%. Total lung capacity of 3.8/62%. DLCO  18.36/52%. Overall consistent with restriction and lung parenchymal disease. Overall PFTs are stable compared to April 2016 but perhaps in DLCO slightly better   OV 04/15/2016  Chief Complaint  Patient presents with  . Interstitial Lung Disease    Breathing is unchanged since last OV. Reports SOB, coughing. Cough is non productive. Denies chest tightness or wheezing.   Follow-up interstitial lung disease due to autoimmune processes not otherwise specified. On CellCept and prednisone. Not on Bactrim prophylaxis due to G6PD deficiency  Last visit April 2017. At that time based on pulmonary function tests showed stability and ILD for a year. 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 She did see local ENT doctor [place] and apparently has been reassured. At this point in time she is applying for long-term disability at work but the work discharged from services and she is now applying for Social Security disability. Her dyspnea stable since the interim. She's also had hysterectomy but for the last few weeks her cough is worse and it is dry. This no fever. Is no weight loss or chills. She says she's compliant with her CellCept and prednisone.  OV 05/29/2016  Chief Complaint  Patient presents with  . Follow-up    Pt states she still has harsh dry cough, pt states her SOB is unchanged and chest tightness when SOB or when coughing a lot. Pt deneis f/c/s.     Follow-up interstitial lung disease due to autoimmune processes not otherwise specified. On CellCept and prednisone since April 2016. Not on Bactrim prophylaxis due to G6PD deficiency  Sundai returns for follow-up. She had high resolution CT chest that shows stability and interstitial lung disease since April 2016. She Pulmicort function test that shows mild improvement since April 2016. Therefore it appears that her CellCept and prednisone is helping her she'll lung disease related to collagen-vascular disease.  However she tells me that she continues to have chronic cough for the last few to several months. It is progressive. It is dry. It is worse than her baseline. The dyspnea is unchanged. This no fever or weight loss or chills   OV 07/24/2016  Chief Complaint  Patient presents with  . Follow-up    Pt states she feels the Arnuity has been helping her breathing. Pt c/o dry cough and occ chest tightness.   rec    ICD-9-CM ICD-10-CM   1. ILD (interstitial lung disease) (HCC) 515 J84.9   2. Chronic cough 786.2 R05   3. High risk medication use V58.69 Z79.899    Clinically improved with cellcept/prednisone and arnuity  Blood work ok dec 2017  pLAN - start pulm rehab at [facility] - continue  aruity daily - continue cellcept and prednisone  Followup - 6 months do Pre-bd spiro and dlco only. No lung volume or bd response. No post-bd spiro - 6 months fu Dr Marchelle Gearing after above or sooner if needed     12/15/2016  f/u ov/Wert re: cough dry on arnuity  Chief Complaint  Patient presents with  . Follow-up    Breathing is unchanged since her last visit. Pt states she is here to f/u on recent ABG result. She c/o feeling tired all of the time. She has occ cough- non prod.       Finished at Rehab   beginning  Of May  2018 and trouble walking fast or uphill Waking up with ha's since rehab sev days a week  Cough is new x one month mostly dry and day > noct   No obvious day to day or daytime variability or assoc excess/ purulent sputum or mucus plugs or hemoptysis or cp or chest tightness, subjective wheeze or overt sinus or hb symptoms. No unusual exp hx or h/o childhood pna/ asthma or knowledge of premature birth.   OV 02/13/2017  Chief Complaint  Patient presents with  . Follow-up    cough/ILD follow up - prod cough with brownish/green mucus with tightness and chest pain x2 days.  denies any f/c/s, hemoptysis.  PFT done today.    Follow-up interstitial lung disease due to autoimmune  processes not otherwise specified. On CellCept and prednisone since April 2016. Not on Bactrim prophylaxis due to G6PD deficiency  47 year old female immunosuppressed with CellCept and prednisone. Not seen her in many months. Currently taking prednisone 3 mg per day and CellCept 1000 mg twice daily. She cannot do Bactrim prophylaxis because of G6PD deficiency. She tells me that overall her health has been stable but in the last few weeks noticing increased shortness of breath in the last few days this increased cough and change in color of sputum to green and increased chest tightness and a feeling that she is getting acute bronchitis. She also was contemplated going to the emergency room a few days ago but now she is better. There is no obvious fever or chills or hemoptysis or edema paroxysmal nocturnal dyspnea or orthopnea. Pulmonary function test today shows 10% decline in FVC and DLCO compared to baseline.   OV 06/08/2017  Chief Complaint  Patient presents with  . Follow-up    Feeling about the same as last visit. Still having chest tightness and wheezing at times, Sounds hoarse.     Follow-up interstitial lung disease due to autoimmune processes not otherwise specified. On CellCept and prednisone since April 2016. Not on Bactrim prophylaxis due to G6PD deficiency. Normal Right heart cath Nov 2016.  Last high-resolution CT November 2017  Last visit August 2018.  There is a routine follow-up.  Overall she feels stable.  FVC shows stability since August 2018 but declined since 1 year ago.  DLCO shows continued decline.  Though her lung health is stable.  She says she lost her 1 only biological sister last week due to lupus.  The sister was only 41 and lived in [place].  There are no new issues. Walking desaturation test on 06/08/2017 185 feet x 3 laps on ROOM AIR:  did not desaturate. Rest pulse ox was 100%%, final pulse ox was 100%. HR response 81/min at rest to 109/min at peak exertion.  Patient  Shannon [name]  Did not Desaturate < 88% . Shannon [name] did nto  Desaturated </= 3% points. CHIA MOWERS yes did get tachyardic   OV 10/15/2017   Follow-up interstitial lung disease due to autoimmune processes not otherwise specified. On CellCept and prednisone since April 2016. Not on Bactrim prophylaxis due to G6PD deficiency. Normal Right heart cath Nov 2016 and feb 2019.  Last high-resolution CT November 2017 and dec 2018 without progresion   Last visit December 2018.  This is a routine follow-up.  In the interim she had high-resolution CT scan of the chest that did not show any progression in interstitial lung disease between 2017 and 2018.Marland Kitchen  Therefore we did an echocardiogram that showed slight elevation in pulmonary artery systolic pressure.  Therefore we sent it to her repeat right heart catheterization and this was normal as documented below.  Overall she feels stable compared to one year ago but has declined compared to 2 years ago.  She is also complaining about a new recurrence of cough that happens mostly at night despite Symbicort and prednisone and CellCept.  It wakes her up.  It is moderate in intensity.  There is no associated wheezing or edema orthopnea.  It happens randomly.  There is associated heartburn.  She is on nothing for acid reflux.       Right Heart Pressures 08/20/17 [facility] Procedural Findings: Hemodynamics (mmHg) RA mean 3 RV 30/6 PA 23/8, mean 14 PCWP mean 8  Oxygen saturations: PA 72% AO 98%  Cardiac Output (Fick) 6.73  Cardiac Index (Fick) 3.06  Cardiac Output (Thermo) 6.94 Cardiac Index (Thermo) 3.15    OV 12/01/2017  Chief Complaint  Patient presents with  . Follow-up    Pt has SOB with exertion, wheezing, some chest tightness. Pt was dry cough more at bedtime.      Follow-up interstitial lung disease due to autoimmune processes not otherwise specified. On CellCept and prednisone since April 2016. Not on Bactrim prophylaxis due to  G6PD deficiency. Normal Right heart cath Nov 2016 and feb 2019.  Last high-resolution CT November 2017 and dec 2018 without progresion   This visit is to see if she is got progressive interstitial lung disease.  Last visit she was complaining of more cough.  Therefore we added some acid reflux treatment.  She says after the acid reflux treatment symptoms have actually improved.  This contradicts what she told the CMA at intake.  Overall she is feeling stable.  However she does have a new episode of orthostatic dizziness going on at a mild level for the last 1 week.  Today after doing pulmonary function test she did feel dizzy.  Therefore we checked orthostatics on her and she got tachycardic standing up and her blood pressure did drop although still within normal limits.  She does not have any chest pain.  Lung function test shows continued stability in the last 1 year including compared to the most recent attempt.  OV 03/08/2018  Subjective:  Patient ID: Shannon [name], female , DOB: 07-26-1970 , age 60 y.o. , MRN: [id number] , ADDRESS: [address]  [place] [facility] [id number]   03/08/2018 -   Chief Complaint  Patient presents with  . Follow-up    Spirometry performed today. Pt states she is still having some mild problems with dizziness but not as bad as last visit. Pt also states she has been having problems with headaches x1 month. Pt states her breathing is about the same  as last visit and also states she still has the dry cough.    Follow-up interstitial lung disease due to autoimmune processes not otherwise specified. On CellCept and prednisone since April 2016. Not on Bactrim prophylaxis due to G6PD deficiency. Normal Right heart cath Nov 2016 and feb 2019.  Last high-resolution CT November 2017 and dec 2018 without progresion     HPI Shannon [name] 47 y.o. - connective tissue disease ILD. Follow-up. Last seen June 2019 by she started having new onset dizziness. It seemed orthostatic. She  says since then it is spontaneously improved but still persists. She is also dealing with fatigue issues. In 02/26/2018 she felt she had a lupus flare saw Dr. Algis Downs and given a prednisone burst. Autoimmune profile at that time showed continued positive ANA titer but normal complements. She had a CBC that showed a drop in hemoglobin by 1 g percent. Her baseline is around 11.5 g percent and currently it is around 10.5 g percent. She says after the prednisone burst a lupus flare symptoms and fatigue have improved although it still persists. If it is actually worse in the last few months. She had pulmonary function test that shows continued improvement especially following the recent prednisone burst. Walking desaturation test was normal other than tachycardia. We observed her to be walking very slowly in a very deconditioned and fatigued fashion. 02/26/2089 and liver function and renal function reviewed and this was normal. Medication review shows she is on oxycodone and gabapentin for back pain. She says she's been on gabapentin for the last 1 year. It appears that the temporal sequence of fatigue and dizziness might be related to gabapentin but she is not so sure.       Results for TAIJAH, COWINS (MRN [id number]) as of 03/08/2018 10:51  Ref. Range 02/26/2018 10:53  Hemoglobin Latest Ref Range: 11.7 - 15.5 g/dL 02.3 (L)  Results for LILLYIAN, SUTER (MRN [id number]) as of 03/08/2018 10:51  Ref. Range 02/26/2018 10:53  Creatinine Latest Ref Range: 0.50 - 1.10 mg/dL 8.37  Results for NOTASHA, ALERS (MRN [id number]) as of 03/08/2018 10:51  Ref. Range 02/26/2018 10:53  AST Latest Ref Range: 10 - 35 U/L 19  ALT Latest Ref Range: 6 - 29 U/L 18   Results for BRIENNA, BIESECKER (MRN [id number]) as of 03/08/2018 10:51  Ref. Range 02/26/2018 10:53  Anti Nuclear Antibody(ANA) Latest Ref Range: NEGATIVE  POSITIVE (A)  ANA Pattern 1 Unknown SPECKLED (A)  ANA Titer 1 Latest Units: titer > OR = 1:1280 (A)  ds DNA Ab Latest Units:  IU/mL <1  C3 Complement Latest Ref Range: 83 - 193 mg/dL 122  C4 Complement Latest Ref Range: 15 - 57 mg/dL 33    ROS - per HPI    OV 06/03/2018  Subjective:  Patient ID: Shannon [name], female , DOB: [birth year] , age 61 y.o. , MRN: [id number] , ADDRESS: [address]  [place] [facility] [address]   06/03/2018 -   Chief Complaint  Patient presents with  . Follow-up    pt states breathing is baseline. c/o sob with exertion, non prod cough & wheezing    Follow-up interstitial lung disease due to autoimmune processes not otherwise specified. On CellCept and prednisone since April 2016. Not on Bactrim prophylaxis due to G6PD deficiency. Normal Right heart cath Nov 2016 and feb 2019.  Last high-resolution CT November 2017 and dec 2018 without progresion   HPI Shannon [name] 47 y.o. -presents for follow-up.  Last visit September 2019.  At that time CBC showed anemia.  We repeated the hemoglobin 1 month later and was stable around 10 g%.  She tells me that overall she is stable.  Last visit she had dizziness and I told her to stop gabapentin which she did and her dizziness and fatigue have improved.  She feels she is stable but in the last 3 days she has had a dry cough but no fever or congestion or wheezing or hemoptysis or edema.  No worsening shortness of breath or chest tightness.  Other than that she feels good.  She did up walking desaturation test today and it appears similar to previous visit.     Results for LAKAYLA, BARRINGTON (MRN [id number]) as of 02/13/2017 12:16  Ref. Range 10/09/2014 09:39 03/28/2015 11:40 10/12/2015 13:42 05/06/2016 10:38 02/13/2017 10:49 06/08/2017  12/01/2017  03/08/2018   FVC-Pre Latest Units: L 2.54 2.55 2.53 2.63 2.32 2.31 2.38 2.46  FVC-%Pred-Pre Latest Units: % 64 64 64 67 59 59% 61% 67%   Results for KEVIONNA, HEFFLER (MRN [id number]) as of 02/13/2017 12:16  Ref. Range 10/09/2014 09:39 03/28/2015 11:40 10/12/2015 13:42 05/06/2016 10:38 02/13/2017 10:49 06/08/2017   12/01/2017  03/08/2018   DLCO unc Latest Units: ml/min/mmHg 15.03 14.50 18.36 18.31 16.35 14.21 16.38 x  DLCO unc % pred Latest Units: % 43 42 53 53 47 41 47% x       Simple office walk 185 feet x  3 laps goal with forehead probe 03/08/2018  06/03/2018   O2 used Room air Room iar  Number laps completed 3 3 x 250 feet  Comments about pace Slow pace normal  Resting Pulse Ox/HR 100% and 96/min 100% and 76  Final Pulse Ox/HR 100% and 112/min 96% and 121  Desaturated </= 88% n no  Desaturated <= 3% points no Yes, 4 points  Got Tachycardic >/= 90/min yes yes  Symptoms at end of test Mild fatigue Mild dyspnea  Miscellaneous comments Very slow pace     ROS - per HPI     has a past medical history of Anemia, G6PD deficiency, ILD (interstitial lung disease) (HCC), Lupus (HCC), and Rheumatoid arthritis (HCC).   reports that she quit smoking about 3 years ago. Her smoking use included cigarettes. She has a 7.00 pack-year smoking history. She has never used smokeless tobacco.  Past Surgical History:  Procedure Laterality Date  . APPENDECTOMY  1980  . CARDIAC CATHETERIZATION N/A 05/10/2015   Procedure: Right Heart Cath;  Surgeon: Laurey Morale, MD;  Location: [facility] INVASIVE CV LAB;  Service: Cardiovascular;  Laterality: N/A;  . CESAREAN SECTION  '95, '02, '07   X 3  . CHOLECYSTECTOMY  2010  . LAPAROSCOPIC HYSTERECTOMY  10/2015   have ovaries  . RIGHT HEART CATH N/A 08/20/2017   Procedure: RIGHT HEART CATH;  Surgeon: Laurey Morale, MD;  Location[facility] INVASIVE CV LAB;  Service: Cardiovascular;  Laterality: N/A;  . TUBAL LIGATION      Allergies  Allergen Reactions  . Asa [Aspirin] Hives and Shortness Of Breath    Chest tightness   . Penicillins Hives    Has patient had a PCN reaction causing immediate rash, facial/tongue/throat swelling, SOB or lightheadedness with hypotension: Unknown Has patient had a PCN reaction causing severe rash involving mucus membranes or skin necrosis:  Unknown Has patient had a PCN reaction that required hospitalization: Unknown Has patient had a PCN reaction occurring within the last 10 years: No If all  of the above answers are "NO", then may proceed with Cephalosporin use.   . Sulfa Antibiotics     G6PD deficiency     Immunization History  Administered Date(s) Administered  . Influenza Split 03/23/2014  . Influenza,inj,Quad PF,6+ Mos 03/15/2016, 06/08/2017, 03/08/2018  . Pneumococcal-Unspecified 08/23/2014    Family History  Problem Relation Age of Onset  . Sarcoidosis Mother   . Lupus Sister   . Healthy Daughter   . Healthy Son   . Healthy Son      Current Outpatient Medications:  .  albuterol (PROVENTIL HFA;VENTOLIN HFA) 108 (90 Base) MCG/ACT inhaler, Inhale 2 puffs into the lungs every 6 (six) hours as needed for wheezing or shortness of breath., Disp: 1 Inhaler, Rfl: 5 .  buPROPion (WELLBUTRIN XL) 150 MG 24 hr tablet, TAKE 1 TABLET BY MOUTH IN THE MORNING FOR DEPRESSION, Disp: , Rfl: 1 .  clotrimazole (LOTRIMIN) 1 % cream, Apply 1 application topically 2 (two) times daily., Disp: 60 g, Rfl: 0 .  diclofenac sodium (VOLTAREN) 1 % GEL, Apply 2 g topically 4 (four) times daily as needed (pain). , Disp: , Rfl:  .  famotidine (PEPCID) 20 MG tablet, Take 1 tablet (20 mg total) by mouth daily., Disp: 30 tablet, Rfl: 2 .  ferrous sulfate 325 (65 FE) MG tablet, Take 325 mg by mouth daily with breakfast. , Disp: , Rfl:  .  ibuprofen (ADVIL,MOTRIN) 200 MG tablet, Take 600 mg by mouth every 6 (six) hours as needed for moderate pain. , Disp: , Rfl:  .  magic mouthwash w/lidocaine SOLN, Take 5 mLs by mouth 3 (three) times daily. Swish and spit, Disp: 100 mL, Rfl: 0 .  mycophenolate (CELLCEPT) 500 MG tablet, Take 2 tablets (1,000 mg total) by mouth 2 (two) times daily., Disp: 360 tablet, Rfl: 1 .  oxyCODONE (OXY IR/ROXICODONE) 5 MG immediate release tablet, Take 5 mg by mouth 2 (two) times daily as needed. , Disp: , Rfl:  .  predniSONE  (DELTASONE) 1 MG tablet, TAKE 3 TABLETS BY MOUTH ONCE DAILY WITH BREAKFAST, Disp: 90 tablet, Rfl: 0 .  ranitidine (ZANTAC) 300 MG tablet, Take 1 tablet (300 mg total) by mouth at bedtime., Disp: 30 tablet, Rfl: 5 .  sertraline (ZOLOFT) 50 MG tablet, daily., Disp: , Rfl: 0 .  SUMAtriptan (IMITREX) 100 MG tablet, TAKE 1 TABLET BY MOUTH AS DIRECTED. MAY REPEAT ONCE IF NEEDED. MAXIMUM OF 2 TABLETS PER DAY., Disp: , Rfl: 1 .  amLODipine (NORVASC) 5 MG tablet, Take 1 tablet (5 mg total) by mouth daily., Disp: 30 tablet, Rfl: 3 .  azithromycin (ZITHROMAX) 250 MG tablet, Take 2 tablets (500 mg) on  Day 1,  followed by 1 tablet (250 mg) once daily on Days 2 through 5., Disp: 6 each, Rfl: 0      Objective:   Vitals:   06/03/18 1350  BP: 132/78  Pulse: 78  SpO2: 100%  Weight: 249 lb (112.9 kg)  Height: 5\' 9"  (1.753 m)    Estimated body mass index is 36.77 kg/m as calculated from the following:   Height as of this encounter: 5\' 9"  (1.753 m).   Weight as of this encounter: 249 lb (112.9 kg).  @WEIGHTCHANGE @    06/03/18 1350  Weight: 249 lb (112.9 kg)     Physical Exam  General Appearance:    Alert, cooperative, no distress, appears stated age - older , Deconditioned looking - yes , OBESE  - yes, Sitting on Wheelchair -  n  Head:    Normocephalic, without obvious abnormality, atraumatic  Eyes:    PERRL, conjunctiva/corneas clear,  Ears:    Normal TM's and external ear canals, both ears  Nose:   Nares normal, septum midline, mucosa normal, no drainage    or sinus tenderness. OXYGEN ON  - no . Patient is @ ra   Throat:   Lips, mucosa, and tongue normal; teeth and gums normal. Cyanosis on lips - no  Neck:   Supple, symmetrical, trachea midline, no adenopathy;    thyroid:  no enlargement/tenderness/nodules; no carotid   bruit or JVD  Back:     Symmetric, no curvature, ROM normal, no CVA tenderness  Lungs:     Distress - no , Wheeze no, Barrell Chest - no, Purse lip breathing  - no, Crackles - yes at base   Chest Wall:    No tenderness or deformity.    Heart:    Regular rate and rhythm, S1 and S2 normal, no rub   or gallop, Murmur - n  Breast Exam:    NOT DONE  Abdomen:     Soft, non-tender, bowel sounds active all four quadrants,    no masses, no organomegaly. Visceral obesity - yes  Genitalia:   NOT DONE  Rectal:   NOT DONE  Extremities:   Extremities - normal, Has Cane - no, Clubbing - no, Edema - no  Pulses:   2+ and symmetric all extremities  Skin:   Stigmata of Connective Tissue Disease - no  Lymph nodes:   Cervical, supraclavicular, and axillary nodes normal  Psychiatric:  Neurologic:   Pleasant - yes, Anxious - no, Flat affect - yes  CAm-ICU - neg, Alert and Oriented x 3 - yes, Moves all 4s - yes, Speech - normal, Cognition - intact           Assessment:       ICD-10-CM   1. Upper respiratory tract infection, unspecified type J06.9   2. ILD (interstitial lung disease) (HCC) J84.9 Pulmonary function test  3. Interstitial lung disease due to connective tissue disease (HCC) M35.8    J84.89   4. Therapeutic drug monitoring Z51.81 CBC w/Diff    Basic metabolic panel    Hepatic function panel  5. High risk medication use Z79.899   6. Anemia, unspecified type D64.9        Plan:     Patient Instructions     ICD-10-CM   1. Upper respiratory tract infection, unspecified type J06.9   2. ILD (interstitial lung disease) (HCC) J84.9   3. Interstitial lung disease due to connective tissue disease (HCC) M35.8    J84.89   4. Therapeutic drug monitoring Z51.81 CBC w/Diff    Basic metabolic panel    Hepatic function panel  5. High risk medication use Z79.899   6. Anemia, unspecified type D64.9    Clinically stable but likel have URI  Plan Z pak Check cbc, bmet , lft  Continue cellcept and prednisone Please talk to PCP [name], [name], PA-C -  And discuss gettingt  shingrix (GSK) inactivated vaccine against shingles  Followup 6 months do  Spiro/DLCO 6 months in ILD clinic; return sooner if worse/needed -      SIGNATURE    Dr. Kalman Shan, M.D., F.C.C.P,  Pulmonary and Critical Care Medicine Staff Physician, [facility] Director - Interstitial Lung Disease  Program  Pulmonary Fibrosis [facility] at [facility] [place], [place], [address]  Pager: [phone]  [phone], If no answer or between  15:00h - 7:00h: call [phone] Telephone: [phone]  2:20 PM 06/03/2018

## 2018-06-03 NOTE — Addendum Note (Signed)
Addended by: Demetrio Lapping E on: 06/03/2018 02:24 PM   Modules accepted: Orders

## 2018-06-03 NOTE — Patient Instructions (Addendum)
ICD-10-CM   1. Upper respiratory tract infection, unspecified type J06.9   2. ILD (interstitial lung disease) (HCC) J84.9   3. Interstitial lung disease due to connective tissue disease (HCC) M35.8    J84.89   4. Therapeutic drug monitoring Z51.81 CBC w/Diff    Basic metabolic panel    Hepatic function panel  5. High risk medication use Z79.899   6. Anemia, unspecified type D64.9    Clinically stable but likel have URI  Plan Z pak Check cbc, bmet , lft  Continue cellcept and prednisone Please talk to PCP [name], [name], PA-C -  And discuss gettingt  shingrix (GSK) inactivated vaccine against shingles  Followup 6 months do Spiro/DLCO 6 months in ILD clinic; return sooner if worse/needed -

## 2018-06-03 NOTE — Addendum Note (Signed)
Addended by: Maxwell Marion A on: 06/03/2018 02:25 PM   Modules accepted: Orders

## 2018-06-03 NOTE — Addendum Note (Signed)
Addended by: Demetrio Lapping E on: 06/03/2018 02:23 PM   Modules accepted: Orders

## 2018-06-04 ENCOUNTER — Telehealth: Payer: Self-pay | Admitting: Internal Medicine

## 2018-06-04 DIAGNOSIS — J849 Interstitial pulmonary disease, unspecified: Secondary | ICD-10-CM

## 2018-06-04 MED ORDER — POTASSIUM CHLORIDE ER 20 MEQ PO TBCR: 20.0000 meq | EXTENDED_RELEASE_TABLET | Freq: Every day | ORAL | 0 refills | Status: DC

## 2018-06-04 NOTE — Telephone Encounter (Signed)
Called and spoke with pt letting her know that we did start a PA for the cellcept and that we are awaiting a response. Also stated to pt the results of the labwork and that due to her K being low, we were sending in an Rx for Kcl for her to take and also stated to her that we were needing her to repeat labs in 1-2 weeks. Pt expressed understanding.  Will keep encounter open until we know status of PA.

## 2018-06-04 NOTE — Telephone Encounter (Signed)
LEt Shannon [name] know labs ok byt k is low  Plan kcl daily - rewcheck in 7-14 days  A bmet

## 2018-06-04 NOTE — Telephone Encounter (Signed)
PA request received from [facility] in [facility] requested: Cellcept 500mg  CMM Key: PA request has been sent to plan, and a determination is expected within 1-3 business days.   Routing to [place] for follow-up.

## 2018-06-05 DIAGNOSIS — N3090 Cystitis, unspecified without hematuria: Secondary | ICD-10-CM | POA: Diagnosis not present

## 2018-06-05 DIAGNOSIS — R300 Dysuria: Secondary | ICD-10-CM | POA: Diagnosis not present

## 2018-06-07 NOTE — Telephone Encounter (Signed)
Checked [email or web address] to see if we had a determination from PA that was done:  Your request has been denied   PA Case: [id number], Status: Denied. Questions? Contact [phone].  Called pt's pharmacy and spoke with Kirsta to see if pt picked up medication. Per Clydie Braun, the Rx was waiting for PA to be done and I stated to her that it was denied. Without approval from insurance, a 90 day supply $1505 and a 30 day supply is $542  If pt had a goodRx card, the cost of the med might cost $150 for a 90 day supply. Kelle stated it might be better for pt to look up the Rx on her phone and if she needed any help with this the pharmacy could help her.  Called pt and stated this information to her. Pt expressed understanding and stated she would go to goodrx to check it out. Nothing further needed.

## 2018-06-08 ENCOUNTER — Telehealth: Payer: Self-pay | Admitting: Internal Medicine

## 2018-06-08 NOTE — Telephone Encounter (Signed)
I called Shannon [name] nut she did not answer. I left VM for her to call us back to discuss.

## 2018-06-08 NOTE — Telephone Encounter (Signed)
Patient has been approved for the cellcept  At this time nothing further needed at this time.

## 2018-06-08 NOTE — Telephone Encounter (Signed)
Shannon [name] from [place] is calling back [phone]

## 2018-06-09 DIAGNOSIS — Z79899 Other long term (current) drug therapy: Secondary | ICD-10-CM | POA: Diagnosis not present

## 2018-06-09 DIAGNOSIS — R69 Illness, unspecified: Secondary | ICD-10-CM | POA: Diagnosis not present

## 2018-06-09 DIAGNOSIS — M3213 Lung involvement in systemic lupus erythematosus: Secondary | ICD-10-CM | POA: Diagnosis not present

## 2018-06-09 DIAGNOSIS — Z7952 Long term (current) use of systemic steroids: Secondary | ICD-10-CM | POA: Diagnosis not present

## 2018-06-09 DIAGNOSIS — F411 Generalized anxiety disorder: Secondary | ICD-10-CM | POA: Diagnosis not present

## 2018-06-10 ENCOUNTER — Telehealth: Payer: Self-pay | Admitting: Internal Medicine

## 2018-06-10 MED ORDER — MYCOPHENOLATE MOFETIL 500 MG PO TABS: 1000.0000 mg | ORAL_TABLET | Freq: Two times a day (BID) | ORAL | 1 refills | Status: DC

## 2018-06-10 NOTE — Telephone Encounter (Signed)
Sent refill of pt's cellcept to pt's pharmacy with dx code on it.  Called pt's pharmacy letting them know this had been done. Nothing further needed.

## 2018-06-14 ENCOUNTER — Other Ambulatory Visit (INDEPENDENT_AMBULATORY_CARE_PROVIDER_SITE_OTHER): Payer: Medicare HMO

## 2018-06-14 ENCOUNTER — Telehealth: Payer: Self-pay | Admitting: Internal Medicine

## 2018-06-14 DIAGNOSIS — J849 Interstitial pulmonary disease, unspecified: Secondary | ICD-10-CM | POA: Diagnosis not present

## 2018-06-14 DIAGNOSIS — E876 Hypokalemia: Secondary | ICD-10-CM

## 2018-06-14 LAB — BASIC METABOLIC PANEL
BUN: 13 mg/dL (ref 6–23)
CO2: 25 mEq/L (ref 19–32)
Calcium: 8.7 mg/dL (ref 8.4–10.5)
Chloride: 106 mEq/L (ref 96–112)
Creatinine, Ser: 0.73 mg/dL (ref 0.40–1.20)
GFR: 110.61 mL/min (ref >60.00)
Glucose, Bld: 104 mg/dL — ABNORMAL HIGH (ref 70–99)
Potassium: 3.3 mEq/L — ABNORMAL LOW (ref 3.5–5.1)
Sodium: 138 mEq/L (ref 135–145)

## 2018-06-14 NOTE — Telephone Encounter (Signed)
     K continues to be low  How much KCL is she taking?  Please call lab and ask if they can run and mag and phos to the bmet sample?     LABS    PULMONARY No results for input(s): PHART, PCO2ART, PO2ART, HCO3, TCO2, O2SAT in the last 168 hours.  Invalid input(s): PCO2, PO2  CBC No results for input(s): HGB, HCT, WBC, PLT in the last 168 hours.  COAGULATION No results for input(s): INR in the last 168 hours.  CARDIAC  No results for input(s): TROPONINI in the last 168 hours. No results for input(s): PROBNP in the last 168 hours.   CHEMISTRY Recent Labs  Lab 06/14/18 1128  NA 138  K 3.3*  CL 106  CO2 25  GLUCOSE 104*  BUN 13  CREATININE 0.73  CALCIUM 8.7   Estimated Creatinine Clearance: 119 mL/min (by C-G formula based on SCr of 0.73 mg/dL).   LIVER No results for input(s): AST, ALT, ALKPHOS, BILITOT, PROT, ALBUMIN, INR in the last 168 hours.   INFECTIOUS No results for input(s): LATICACIDVEN, PROCALCITON in the last 168 hours.   ENDOCRINE CBG (last 3)  No results for input(s): GLUCAP in the last 72 hours.       IMAGING x48h  - image(s) personally visualized  -   highlighted in bold No results found.    SIGNATURE    Dr. Kalman Shan, M.D., F.C.C.P,  Pulmonary and Critical Care Medicine Staff Physician, [facility] Director - Interstitial Lung Disease  Program  Pulmonary Fibrosis [facility] at [facility], [place], [address]  Pager: [phone], If no answer or between  15:00h - 7:00h: call [phone] Telephone: [phone]  6:14 PM 06/14/2018

## 2018-06-15 ENCOUNTER — Other Ambulatory Visit (INDEPENDENT_AMBULATORY_CARE_PROVIDER_SITE_OTHER): Payer: Medicare HMO

## 2018-06-15 DIAGNOSIS — E876 Hypokalemia: Secondary | ICD-10-CM | POA: Diagnosis not present

## 2018-06-15 LAB — MAGNESIUM: Magnesium: 1.8 mg/dL (ref 1.5–2.5)

## 2018-06-15 LAB — PHOSPHORUS: Phosphorus: 2 mg/dL — ABNORMAL LOW (ref 2.3–4.6)

## 2018-06-15 NOTE — Telephone Encounter (Addendum)
Spoke with pt, she states she is taking 20 meq one tablet a day.  I called the lab to add on test and sent over a request form to [phone]. Hopefully labs can be added, if not they will contact us.

## 2018-06-15 NOTE — Telephone Encounter (Signed)
pls tell her to start taking kcl per day Let me know when mag and phos ar resulted

## 2018-06-17 ENCOUNTER — Other Ambulatory Visit: Payer: Self-pay | Admitting: *Deleted

## 2018-06-17 MED ORDER — PHOSPHA 250 NEUTRAL 155-852-130 MG PO TABS: 500.0000 mg | ORAL_TABLET | Freq: Every day | ORAL | 0 refills | Status: DC

## 2018-06-17 MED ORDER — MAGNESIUM OXIDE 400 MG PO CAPS: 400.0000 mg | ORAL_CAPSULE | Freq: Every day | ORAL | 0 refills | Status: DC

## 2018-06-17 MED ORDER — PHOSPHA 250 NEUTRAL 155-852-130 MG PO TABS: 500.0000 mg | ORAL_TABLET | ORAL | 0 refills | Status: DC

## 2018-06-17 MED ORDER — MAGNESIUM OXIDE 400 MG PO TABS: 400.0000 mg | ORAL_TABLET | Freq: Every day | ORAL | 0 refills | Status: DC

## 2018-06-17 NOTE — Telephone Encounter (Signed)
Called and spoke with pt letting her know the results of labwork and stated to her based on results, MR wanted her to begin taking mag oxide as well as neutraphos and repeat labs in 7-14 days. Pt expressed understanding.  Orders for labwork. When locating the magnesium oxide Rx, it stated that this Rx was avail OTC. Attempted to call pt to let her know this but line went straight to VM. Left detailed message for pt stating that this med is one she will obtain OTC at pharmacy.    MR, when trying to locate neutraphos Rx, nothing could be found like that but I did find Rx phospha 250 neutral Rx. Please advise if this is the correct Rx that needs to be sent in as the neutraphos for pt to then take 500mg  daily.

## 2018-06-17 NOTE — Telephone Encounter (Signed)
When trying to place orders for the Rx, the Rx kept printing.  Realized on the Rx that it was under critical care and this is the reason why  Rx kept printing. Have opened up another encounter for orders only so I can be able to send Rx to pt's pharmacy. Closing this encounter.

## 2018-06-17 NOTE — Telephone Encounter (Signed)
Called and spoke with pt to let her know MR said to take of potassium until we had lab results back. Pt expressed understanding.   Magnesium: 1.8 and Phosphorus: 2.0  Checked in lab tab and saw that the mag and phos have been resulted routing to MR to let him know this has been done.

## 2018-06-17 NOTE — Telephone Encounter (Signed)
Also start mag oxide 400mg  per day and neutraphos 500mg  daily  Continue KCL per day  Repeat bmet, phos, mag in 7-14 days

## 2018-06-17 NOTE — Telephone Encounter (Signed)
phospha 250 neutral Rx - this fine. She can 2 tablets or sachet or this daily for total 500mg 

## 2018-06-17 NOTE — Progress Notes (Signed)
meds to be prescribed for pt based on labwork per MR. Nothing further needed.

## 2018-06-24 ENCOUNTER — Other Ambulatory Visit: Payer: Self-pay | Admitting: Internal Medicine

## 2018-06-25 NOTE — Telephone Encounter (Signed)
Call made to pharmacy, they are going to fill order but it will not be ready until late Monday afternoon. Call made to patient to make aware. Voiced understanding. Nothing further needed at this time.

## 2018-07-06 DIAGNOSIS — M170 Bilateral primary osteoarthritis of knee: Secondary | ICD-10-CM | POA: Diagnosis not present

## 2018-07-06 DIAGNOSIS — M069 Rheumatoid arthritis, unspecified: Secondary | ICD-10-CM | POA: Diagnosis not present

## 2018-07-06 DIAGNOSIS — Z79891 Long term (current) use of opiate analgesic: Secondary | ICD-10-CM | POA: Diagnosis not present

## 2018-07-06 DIAGNOSIS — M329 Systemic lupus erythematosus, unspecified: Secondary | ICD-10-CM | POA: Diagnosis not present

## 2018-07-06 DIAGNOSIS — G4730 Sleep apnea, unspecified: Secondary | ICD-10-CM | POA: Diagnosis not present

## 2018-07-06 DIAGNOSIS — J45909 Unspecified asthma, uncomplicated: Secondary | ICD-10-CM | POA: Diagnosis not present

## 2018-07-06 DIAGNOSIS — G894 Chronic pain syndrome: Secondary | ICD-10-CM | POA: Diagnosis not present

## 2018-07-06 DIAGNOSIS — M2550 Pain in unspecified joint: Secondary | ICD-10-CM | POA: Diagnosis not present

## 2018-07-14 ENCOUNTER — Other Ambulatory Visit: Payer: Medicare HMO

## 2018-07-14 DIAGNOSIS — J849 Interstitial pulmonary disease, unspecified: Secondary | ICD-10-CM

## 2018-07-15 LAB — BASIC METABOLIC PANEL
BUN: 9 mg/dL (ref 7–25)
CO2: 25 mmol/L (ref 20–32)
Calcium: 8.4 mg/dL — ABNORMAL LOW (ref 8.6–10.2)
Chloride: 105 mmol/L (ref 98–110)
Creat: 0.78 mg/dL (ref 0.50–1.10)
Glucose, Bld: 91 mg/dL (ref 65–99)
Potassium: 3.6 mmol/L (ref 3.5–5.3)
Sodium: 138 mmol/L (ref 135–146)

## 2018-07-15 LAB — PHOSPHORUS: Phosphorus: 2.3 mg/dL — ABNORMAL LOW (ref 2.5–4.5)

## 2018-07-15 LAB — MAGNESIUM: Magnesium: 1.8 mg/dL (ref 1.5–2.5)

## 2018-07-19 NOTE — Progress Notes (Deleted)
Office Visit Note  Patient: Shannon [name]             Date of Birth: Jul 24, 1970           MRN: [id number]             PCP: Eunice Blase, PA-C Referring: Eunice Blase, PA-C Visit Date: 08/02/2018 Occupation: @GUAROCC @  Subjective:  No chief complaint on file.   History of Present Illness: Shannon [name] is a 48 y.o. female ***   Activities of Daily Living:  Patient reports morning stiffness for *** {minute/hour:19697}.   Patient {ACTIONS;DENIES/REPORTS:21021675::"Denies"} nocturnal pain.  Difficulty dressing/grooming: {ACTIONS;DENIES/REPORTS:21021675::"Denies"} Difficulty climbing stairs: {ACTIONS;DENIES/REPORTS:21021675::"Denies"} Difficulty getting out of chair: {ACTIONS;DENIES/REPORTS:21021675::"Denies"} Difficulty using hands for taps, buttons, cutlery, and/or writing: {ACTIONS;DENIES/REPORTS:21021675::"Denies"}  No Rheumatology ROS completed.   PMFS History:  Patient Active Problem List   Diagnosis Date Noted  . Acute bronchitis 02/13/2017  . Cough variant asthma  vs uacs  12/17/2016  . Morbid obesity due to excess calories (HCC) 12/17/2016  . Contracture of left elbow 11/21/2016  . Primary osteoarthritis of both knees 11/21/2016  . Pain in right hand 09/09/2016  . Pain in left hand 09/09/2016  . History of osteoarthritis 09/09/2016  . Rheumatoid factor positive 08/01/2016  . High risk medication use 01/16/2015  . G6PD deficiency 10/31/2014  . Dyspnea 08/23/2014  . ILD (interstitial lung disease) (HCC) 08/23/2014  . Other organ or system involvement in systemic lupus erythematosus (HCC) 08/23/2014    Past Medical History:  Diagnosis Date  . Anemia   . G6PD deficiency   . ILD (interstitial lung disease) (HCC)   . Lupus (HCC)   . Rheumatoid arthritis (HCC)     Family History  Problem Relation Age of Onset  . Sarcoidosis Mother   . Lupus Sister   . Healthy Daughter   . Healthy Son   . Healthy Son    Past Surgical History:  Procedure Laterality Date    . APPENDECTOMY  1980  . CARDIAC CATHETERIZATION N/A 05/10/2015   Procedure: Right Heart Cath;  Surgeon: Laurey Morale, MD;  Location: [facility] INVASIVE CV LAB;  Service: Cardiovascular;  Laterality: N/A;  . CESAREAN SECTION  '95, '02, '07   X 3  . CHOLECYSTECTOMY  2010  . LAPAROSCOPIC HYSTERECTOMY  10/2015   have ovaries  . RIGHT HEART CATH N/A 08/20/2017   Procedure: RIGHT HEART CATH;  Surgeon: Laurey Morale, MD;  Location: [facility] INVASIVE CV LAB;  Service: Cardiovascular;  Laterality: N/A;  . TUBAL LIGATION     Social History   Social History Narrative   Married, lives with husband, children   Caffeine- coffee  1 daily   Immunization History  Administered Date(s) Administered  . Influenza Split 03/23/2014  . Influenza,inj,Quad PF,6+ Mos 03/15/2016, 06/08/2017, 03/08/2018  . Pneumococcal-Unspecified 08/23/2014     Objective: Vital Signs: LMP 04/28/2015    Physical Exam   Musculoskeletal Exam: ***  CDAI Exam: CDAI Score: Not documented Patient Global Assessment: Not documented; Provider Global Assessment: Not documented Swollen: Not documented; Tender: Not documented Joint Exam   Not documented   There is currently no information documented on the homunculus. Go to the Rheumatology activity and complete the homunculus joint exam.  Investigation: No additional findings.  Imaging: No results found.  Recent Labs: Lab Results  Component Value Date   WBC 4.4 06/03/2018   HGB 11.3 (L) 06/03/2018   PLT 235.0 06/03/2018   NA 138 07/14/2018   K 3.6 07/14/2018   CL  105 07/14/2018   CO2 25 07/14/2018   GLUCOSE 91 07/14/2018   BUN 9 07/14/2018   CREATININE 0.78 07/14/2018   BILITOT 0.3 06/03/2018   ALKPHOS 72 06/03/2018   AST 11 06/03/2018   ALT 12 06/03/2018   PROT 7.5 06/03/2018   ALBUMIN 3.8 06/03/2018   CALCIUM 8.4 (L) 07/14/2018   GFRAA 115 02/26/2018    Speciality Comments: No specialty comments available.  Procedures:  No procedures  performed Allergies: Asa [aspirin]; Penicillins; and Sulfa antibiotics   Assessment / Plan:     Visit Diagnoses: Other organ or system involvement in systemic lupus erythematosus (HCC) - +ANA, Smith, RNP, rheumatoid factor, history of arthritis with knee joint effusion, oral ulcers, Raynauds, anemia, neutropenia, ILD  High risk medication use - Cellcept 1,000 mg po BID and Prednisone 3 mg po daily  ILD (interstitial lung disease) (HCC) - Stable, Dr. Marchelle Gearingamaswamy  G6PD deficiency - She cannot take PLQ or Bactrim.  Primary osteoarthritis of both knees  Rheumatoid factor positive  Other fatigue  Contracture of left elbow  Vitamin D deficiency  Skin yeast infection - Clotrimazole 1% cream    Orders: No orders of the defined types were placed in this encounter.  No orders of the defined types were placed in this encounter.   Face-to-face time spent with patient was *** minutes. Greater than 50% of time was spent in counseling and coordination of care.  Follow-Up Instructions: No follow-ups on file.   Gearldine Bienenstockaylor M Alline Pio, PA-C  Note - This record has been created using Dragon software.  Chart creation errors have been sought, but may not always  have been located. Such creation errors do not reflect on  the standard of medical care.

## 2018-07-28 DIAGNOSIS — N3289 Other specified disorders of bladder: Secondary | ICD-10-CM | POA: Diagnosis not present

## 2018-07-28 DIAGNOSIS — Z6835 Body mass index (BMI) 35.0-35.9, adult: Secondary | ICD-10-CM | POA: Diagnosis not present

## 2018-07-28 DIAGNOSIS — R350 Frequency of micturition: Secondary | ICD-10-CM | POA: Diagnosis not present

## 2018-07-29 DIAGNOSIS — R69 Illness, unspecified: Secondary | ICD-10-CM | POA: Diagnosis not present

## 2018-07-29 DIAGNOSIS — F411 Generalized anxiety disorder: Secondary | ICD-10-CM | POA: Diagnosis not present

## 2018-08-02 ENCOUNTER — Ambulatory Visit: Payer: BLUE CROSS/BLUE SHIELD | Admitting: Physician Assistant

## 2018-08-02 DIAGNOSIS — G4730 Sleep apnea, unspecified: Secondary | ICD-10-CM | POA: Diagnosis not present

## 2018-08-02 DIAGNOSIS — M329 Systemic lupus erythematosus, unspecified: Secondary | ICD-10-CM | POA: Diagnosis not present

## 2018-08-02 DIAGNOSIS — M2550 Pain in unspecified joint: Secondary | ICD-10-CM | POA: Diagnosis not present

## 2018-08-02 DIAGNOSIS — G894 Chronic pain syndrome: Secondary | ICD-10-CM | POA: Diagnosis not present

## 2018-08-02 DIAGNOSIS — M170 Bilateral primary osteoarthritis of knee: Secondary | ICD-10-CM | POA: Diagnosis not present

## 2018-08-02 DIAGNOSIS — M069 Rheumatoid arthritis, unspecified: Secondary | ICD-10-CM | POA: Diagnosis not present

## 2018-08-02 DIAGNOSIS — J45909 Unspecified asthma, uncomplicated: Secondary | ICD-10-CM | POA: Diagnosis not present

## 2018-08-02 DIAGNOSIS — Z79891 Long term (current) use of opiate analgesic: Secondary | ICD-10-CM | POA: Diagnosis not present

## 2018-08-12 ENCOUNTER — Other Ambulatory Visit: Payer: Self-pay | Admitting: Internal Medicine

## 2018-09-01 DIAGNOSIS — N3289 Other specified disorders of bladder: Secondary | ICD-10-CM | POA: Diagnosis not present

## 2018-09-01 DIAGNOSIS — J019 Acute sinusitis, unspecified: Secondary | ICD-10-CM | POA: Diagnosis not present

## 2018-09-01 DIAGNOSIS — Z6835 Body mass index (BMI) 35.0-35.9, adult: Secondary | ICD-10-CM | POA: Diagnosis not present

## 2018-09-01 DIAGNOSIS — R300 Dysuria: Secondary | ICD-10-CM | POA: Diagnosis not present

## 2018-09-02 DIAGNOSIS — M329 Systemic lupus erythematosus, unspecified: Secondary | ICD-10-CM | POA: Diagnosis not present

## 2018-09-02 DIAGNOSIS — J45909 Unspecified asthma, uncomplicated: Secondary | ICD-10-CM | POA: Diagnosis not present

## 2018-09-02 DIAGNOSIS — M069 Rheumatoid arthritis, unspecified: Secondary | ICD-10-CM | POA: Diagnosis not present

## 2018-09-02 DIAGNOSIS — G4730 Sleep apnea, unspecified: Secondary | ICD-10-CM | POA: Diagnosis not present

## 2018-09-02 DIAGNOSIS — Z79891 Long term (current) use of opiate analgesic: Secondary | ICD-10-CM | POA: Diagnosis not present

## 2018-09-02 DIAGNOSIS — M170 Bilateral primary osteoarthritis of knee: Secondary | ICD-10-CM | POA: Diagnosis not present

## 2018-09-02 DIAGNOSIS — M2550 Pain in unspecified joint: Secondary | ICD-10-CM | POA: Diagnosis not present

## 2018-09-02 DIAGNOSIS — G894 Chronic pain syndrome: Secondary | ICD-10-CM | POA: Diagnosis not present

## 2018-09-08 DIAGNOSIS — M3213 Lung involvement in systemic lupus erythematosus: Secondary | ICD-10-CM | POA: Diagnosis not present

## 2018-09-08 DIAGNOSIS — M170 Bilateral primary osteoarthritis of knee: Secondary | ICD-10-CM | POA: Diagnosis not present

## 2018-09-08 DIAGNOSIS — M25461 Effusion, right knee: Secondary | ICD-10-CM | POA: Diagnosis not present

## 2018-09-23 DIAGNOSIS — F411 Generalized anxiety disorder: Secondary | ICD-10-CM | POA: Diagnosis not present

## 2018-09-23 DIAGNOSIS — M170 Bilateral primary osteoarthritis of knee: Secondary | ICD-10-CM | POA: Diagnosis not present

## 2018-09-23 DIAGNOSIS — M1712 Unilateral primary osteoarthritis, left knee: Secondary | ICD-10-CM | POA: Diagnosis not present

## 2018-09-23 DIAGNOSIS — M25562 Pain in left knee: Secondary | ICD-10-CM | POA: Diagnosis not present

## 2018-09-23 DIAGNOSIS — M25561 Pain in right knee: Secondary | ICD-10-CM | POA: Diagnosis not present

## 2018-09-23 DIAGNOSIS — R69 Illness, unspecified: Secondary | ICD-10-CM | POA: Diagnosis not present

## 2018-09-23 DIAGNOSIS — M1711 Unilateral primary osteoarthritis, right knee: Secondary | ICD-10-CM | POA: Diagnosis not present

## 2018-10-07 DIAGNOSIS — M329 Systemic lupus erythematosus, unspecified: Secondary | ICD-10-CM | POA: Diagnosis not present

## 2018-10-07 DIAGNOSIS — Z79891 Long term (current) use of opiate analgesic: Secondary | ICD-10-CM | POA: Diagnosis not present

## 2018-10-07 DIAGNOSIS — M2550 Pain in unspecified joint: Secondary | ICD-10-CM | POA: Diagnosis not present

## 2018-10-07 DIAGNOSIS — J45909 Unspecified asthma, uncomplicated: Secondary | ICD-10-CM | POA: Diagnosis not present

## 2018-10-07 DIAGNOSIS — G4730 Sleep apnea, unspecified: Secondary | ICD-10-CM | POA: Diagnosis not present

## 2018-10-07 DIAGNOSIS — M170 Bilateral primary osteoarthritis of knee: Secondary | ICD-10-CM | POA: Diagnosis not present

## 2018-10-07 DIAGNOSIS — G894 Chronic pain syndrome: Secondary | ICD-10-CM | POA: Diagnosis not present

## 2018-10-07 DIAGNOSIS — M069 Rheumatoid arthritis, unspecified: Secondary | ICD-10-CM | POA: Diagnosis not present

## 2018-10-22 DIAGNOSIS — R69 Illness, unspecified: Secondary | ICD-10-CM | POA: Diagnosis not present

## 2018-11-02 ENCOUNTER — Telehealth: Payer: Self-pay | Admitting: Internal Medicine

## 2018-11-02 MED ORDER — ALBUTEROL SULFATE HFA 108 (90 BASE) MCG/ACT IN AERS: 2.0000 | INHALATION_SPRAY | Freq: Four times a day (QID) | RESPIRATORY_TRACT | 5 refills | Status: DC | PRN

## 2018-11-02 MED ORDER — FAMOTIDINE 20 MG PO TABS: 20.0000 mg | ORAL_TABLET | Freq: Every day | ORAL | 0 refills | Status: DC

## 2018-11-02 NOTE — Telephone Encounter (Signed)
Called and spoke with pt verifying which meds she needed a refill of and also verified pt's preferred pharmacy. Refill of both meds have been sent in for pt. Nothing further needed.

## 2018-11-03 DIAGNOSIS — M329 Systemic lupus erythematosus, unspecified: Secondary | ICD-10-CM | POA: Diagnosis not present

## 2018-11-03 DIAGNOSIS — M170 Bilateral primary osteoarthritis of knee: Secondary | ICD-10-CM | POA: Diagnosis not present

## 2018-11-03 DIAGNOSIS — Z79891 Long term (current) use of opiate analgesic: Secondary | ICD-10-CM | POA: Diagnosis not present

## 2018-11-03 DIAGNOSIS — M2550 Pain in unspecified joint: Secondary | ICD-10-CM | POA: Diagnosis not present

## 2018-11-03 DIAGNOSIS — G894 Chronic pain syndrome: Secondary | ICD-10-CM | POA: Diagnosis not present

## 2018-11-03 DIAGNOSIS — M069 Rheumatoid arthritis, unspecified: Secondary | ICD-10-CM | POA: Diagnosis not present

## 2018-11-03 DIAGNOSIS — J45909 Unspecified asthma, uncomplicated: Secondary | ICD-10-CM | POA: Diagnosis not present

## 2018-11-03 DIAGNOSIS — G4730 Sleep apnea, unspecified: Secondary | ICD-10-CM | POA: Diagnosis not present

## 2018-11-18 DIAGNOSIS — R69 Illness, unspecified: Secondary | ICD-10-CM | POA: Diagnosis not present

## 2018-12-02 DIAGNOSIS — M170 Bilateral primary osteoarthritis of knee: Secondary | ICD-10-CM | POA: Diagnosis not present

## 2018-12-02 DIAGNOSIS — M2550 Pain in unspecified joint: Secondary | ICD-10-CM | POA: Diagnosis not present

## 2018-12-02 DIAGNOSIS — M069 Rheumatoid arthritis, unspecified: Secondary | ICD-10-CM | POA: Diagnosis not present

## 2018-12-02 DIAGNOSIS — G4730 Sleep apnea, unspecified: Secondary | ICD-10-CM | POA: Diagnosis not present

## 2018-12-02 DIAGNOSIS — G894 Chronic pain syndrome: Secondary | ICD-10-CM | POA: Diagnosis not present

## 2018-12-02 DIAGNOSIS — Z79891 Long term (current) use of opiate analgesic: Secondary | ICD-10-CM | POA: Diagnosis not present

## 2018-12-02 DIAGNOSIS — J45909 Unspecified asthma, uncomplicated: Secondary | ICD-10-CM | POA: Diagnosis not present

## 2018-12-02 DIAGNOSIS — M329 Systemic lupus erythematosus, unspecified: Secondary | ICD-10-CM | POA: Diagnosis not present

## 2018-12-09 DIAGNOSIS — F411 Generalized anxiety disorder: Secondary | ICD-10-CM | POA: Diagnosis not present

## 2018-12-09 DIAGNOSIS — R69 Illness, unspecified: Secondary | ICD-10-CM | POA: Diagnosis not present

## 2018-12-29 DIAGNOSIS — M170 Bilateral primary osteoarthritis of knee: Secondary | ICD-10-CM | POA: Diagnosis not present

## 2018-12-29 DIAGNOSIS — M2550 Pain in unspecified joint: Secondary | ICD-10-CM | POA: Diagnosis not present

## 2018-12-29 DIAGNOSIS — G4730 Sleep apnea, unspecified: Secondary | ICD-10-CM | POA: Diagnosis not present

## 2018-12-29 DIAGNOSIS — G894 Chronic pain syndrome: Secondary | ICD-10-CM | POA: Diagnosis not present

## 2018-12-29 DIAGNOSIS — Z79891 Long term (current) use of opiate analgesic: Secondary | ICD-10-CM | POA: Diagnosis not present

## 2018-12-29 DIAGNOSIS — M069 Rheumatoid arthritis, unspecified: Secondary | ICD-10-CM | POA: Diagnosis not present

## 2018-12-29 DIAGNOSIS — Z1389 Encounter for screening for other disorder: Secondary | ICD-10-CM | POA: Diagnosis not present

## 2018-12-29 DIAGNOSIS — J45909 Unspecified asthma, uncomplicated: Secondary | ICD-10-CM | POA: Diagnosis not present

## 2018-12-29 DIAGNOSIS — M329 Systemic lupus erythematosus, unspecified: Secondary | ICD-10-CM | POA: Diagnosis not present

## 2018-12-30 DIAGNOSIS — M25561 Pain in right knee: Secondary | ICD-10-CM | POA: Diagnosis not present

## 2018-12-30 DIAGNOSIS — M25562 Pain in left knee: Secondary | ICD-10-CM | POA: Diagnosis not present

## 2018-12-30 DIAGNOSIS — M170 Bilateral primary osteoarthritis of knee: Secondary | ICD-10-CM | POA: Diagnosis not present

## 2019-01-06 DIAGNOSIS — M170 Bilateral primary osteoarthritis of knee: Secondary | ICD-10-CM | POA: Diagnosis not present

## 2019-01-12 DIAGNOSIS — M3213 Lung involvement in systemic lupus erythematosus: Secondary | ICD-10-CM | POA: Diagnosis not present

## 2019-01-13 DIAGNOSIS — M1712 Unilateral primary osteoarthritis, left knee: Secondary | ICD-10-CM | POA: Diagnosis not present

## 2019-01-13 DIAGNOSIS — M25561 Pain in right knee: Secondary | ICD-10-CM | POA: Diagnosis not present

## 2019-01-13 DIAGNOSIS — M170 Bilateral primary osteoarthritis of knee: Secondary | ICD-10-CM | POA: Diagnosis not present

## 2019-01-13 DIAGNOSIS — M1711 Unilateral primary osteoarthritis, right knee: Secondary | ICD-10-CM | POA: Diagnosis not present

## 2019-01-13 DIAGNOSIS — M25562 Pain in left knee: Secondary | ICD-10-CM | POA: Diagnosis not present

## 2019-01-21 DIAGNOSIS — M3213 Lung involvement in systemic lupus erythematosus: Secondary | ICD-10-CM | POA: Diagnosis not present

## 2019-01-28 DIAGNOSIS — G890 Central pain syndrome: Secondary | ICD-10-CM | POA: Diagnosis not present

## 2019-01-28 DIAGNOSIS — G894 Chronic pain syndrome: Secondary | ICD-10-CM | POA: Diagnosis not present

## 2019-02-02 DIAGNOSIS — M545 Low back pain: Secondary | ICD-10-CM | POA: Diagnosis not present

## 2019-02-02 DIAGNOSIS — Z1389 Encounter for screening for other disorder: Secondary | ICD-10-CM | POA: Diagnosis not present

## 2019-02-02 DIAGNOSIS — G890 Central pain syndrome: Secondary | ICD-10-CM | POA: Diagnosis not present

## 2019-02-02 DIAGNOSIS — M329 Systemic lupus erythematosus, unspecified: Secondary | ICD-10-CM | POA: Diagnosis not present

## 2019-02-02 DIAGNOSIS — G894 Chronic pain syndrome: Secondary | ICD-10-CM | POA: Diagnosis not present

## 2019-02-07 DIAGNOSIS — G890 Central pain syndrome: Secondary | ICD-10-CM | POA: Diagnosis not present

## 2019-02-07 DIAGNOSIS — R69 Illness, unspecified: Secondary | ICD-10-CM | POA: Diagnosis not present

## 2019-02-07 DIAGNOSIS — F411 Generalized anxiety disorder: Secondary | ICD-10-CM | POA: Diagnosis not present

## 2019-02-07 DIAGNOSIS — G894 Chronic pain syndrome: Secondary | ICD-10-CM | POA: Diagnosis not present

## 2019-02-11 DIAGNOSIS — G890 Central pain syndrome: Secondary | ICD-10-CM | POA: Diagnosis not present

## 2019-02-11 DIAGNOSIS — G894 Chronic pain syndrome: Secondary | ICD-10-CM | POA: Diagnosis not present

## 2019-02-16 ENCOUNTER — Telehealth: Payer: Self-pay | Admitting: Internal Medicine

## 2019-02-16 DIAGNOSIS — G894 Chronic pain syndrome: Secondary | ICD-10-CM | POA: Diagnosis not present

## 2019-02-16 DIAGNOSIS — G890 Central pain syndrome: Secondary | ICD-10-CM | POA: Diagnosis not present

## 2019-02-16 NOTE — Telephone Encounter (Signed)
Called and spoke with Patient.  Patient requested flu vaccine.  Flu vaccine scheduled for 02/17/19, at 1030.  Nothing further at this time.

## 2019-02-17 ENCOUNTER — Other Ambulatory Visit: Payer: Self-pay

## 2019-02-17 ENCOUNTER — Ambulatory Visit (INDEPENDENT_AMBULATORY_CARE_PROVIDER_SITE_OTHER): Payer: Medicare HMO

## 2019-02-17 DIAGNOSIS — Z23 Encounter for immunization: Secondary | ICD-10-CM | POA: Diagnosis not present

## 2019-02-17 NOTE — Progress Notes (Signed)
Patient came to office requesting flu vaccine. Flu vaccine info given.  Reg flu vaccine administered. Nothing further.

## 2019-02-18 DIAGNOSIS — G894 Chronic pain syndrome: Secondary | ICD-10-CM | POA: Diagnosis not present

## 2019-02-18 DIAGNOSIS — G890 Central pain syndrome: Secondary | ICD-10-CM | POA: Diagnosis not present

## 2019-02-24 DIAGNOSIS — M25562 Pain in left knee: Secondary | ICD-10-CM | POA: Diagnosis not present

## 2019-02-24 DIAGNOSIS — M25561 Pain in right knee: Secondary | ICD-10-CM | POA: Diagnosis not present

## 2019-02-25 DIAGNOSIS — G890 Central pain syndrome: Secondary | ICD-10-CM | POA: Diagnosis not present

## 2019-02-25 DIAGNOSIS — G894 Chronic pain syndrome: Secondary | ICD-10-CM | POA: Diagnosis not present

## 2019-03-02 ENCOUNTER — Other Ambulatory Visit: Payer: Self-pay | Admitting: Internal Medicine

## 2019-03-02 DIAGNOSIS — M545 Low back pain: Secondary | ICD-10-CM | POA: Diagnosis not present

## 2019-03-02 DIAGNOSIS — G894 Chronic pain syndrome: Secondary | ICD-10-CM | POA: Diagnosis not present

## 2019-03-02 DIAGNOSIS — Z1389 Encounter for screening for other disorder: Secondary | ICD-10-CM | POA: Diagnosis not present

## 2019-03-02 DIAGNOSIS — M329 Systemic lupus erythematosus, unspecified: Secondary | ICD-10-CM | POA: Diagnosis not present

## 2019-03-15 DIAGNOSIS — M25662 Stiffness of left knee, not elsewhere classified: Secondary | ICD-10-CM | POA: Diagnosis not present

## 2019-03-15 DIAGNOSIS — M1712 Unilateral primary osteoarthritis, left knee: Secondary | ICD-10-CM | POA: Diagnosis not present

## 2019-03-15 DIAGNOSIS — M25562 Pain in left knee: Secondary | ICD-10-CM | POA: Diagnosis not present

## 2019-03-16 DIAGNOSIS — Z01818 Encounter for other preprocedural examination: Secondary | ICD-10-CM | POA: Diagnosis not present

## 2019-03-16 DIAGNOSIS — E669 Obesity, unspecified: Secondary | ICD-10-CM | POA: Diagnosis not present

## 2019-03-16 DIAGNOSIS — I10 Essential (primary) hypertension: Secondary | ICD-10-CM | POA: Diagnosis not present

## 2019-03-16 DIAGNOSIS — M25569 Pain in unspecified knee: Secondary | ICD-10-CM | POA: Diagnosis not present

## 2019-03-16 DIAGNOSIS — Z6838 Body mass index (BMI) 38.0-38.9, adult: Secondary | ICD-10-CM | POA: Diagnosis not present

## 2019-03-16 DIAGNOSIS — Z79899 Other long term (current) drug therapy: Secondary | ICD-10-CM | POA: Diagnosis not present

## 2019-03-29 NOTE — Patient Instructions (Addendum)
DUE TO COVID-19 ONLY ONE VISITOR IS ALLOWED TO COME WITH YOU AND STAY IN THE WAITING ROOM ONLY DURING PRE OP AND PROCEDURE DAY OF SURGERY. THE 1 VISITOR MAY VISIT WITH YOU AFTER SURGERY IN YOUR PRIVATE ROOM DURING VISITING HOURS ONLY!  YOU NEED TO HAVE A COVID 19 TEST ON__Thursday 10/08/2020_____  am_____, THIS TEST MUST BE DONE BEFORE SURGERY, COME  [address], [place] , [id number].  [facility]) ONCE YOUR COVID TEST IS COMPLETED, PLEASE BEGIN THE QUARANTINE INSTRUCTIONS AS OUTLINED IN YOUR HANDOUT.                KRYSLYN HELBIG    Your procedure is scheduled on: Monday 04/04/2019   Report to [facility] Main  Entrance    Report to admitting at 10:05  AM     Call this number if you have problems the morning of surgery [phone]    Remember: Do not eat food  After Midnight.     BRUSH YOUR TEETH MORNING OF SURGERY AND RINSE YOUR MOUTH OUT, NO CHEWING GUM CANDY OR MINTS.     Take these medicines the morning of surgery with A SIP OF WATER: Amlodipine (Norvasc), Bupropion (Wellbutrin), Famotidine (Pepcid), Mycophenolate (Cellcept), Use albuterol inhaler if needed and  bring to the hospital.    NO SOLID FOOD AFTER MIDNIGHT THE NIGHT PRIOR TO SURGERY. NOTHING BY MOUTH EXCEPT CLEAR LIQUIDS UNTIL  9:30am   PLEASE FINISH ENSURE DRINK PER SURGEON ORDER  WHICH NEEDS TO BE COMPLETED AT  9:30am   CLEAR LIQUID DIET   Foods Allowed                                                                     Foods Excluded  Coffee and tea, regular and decaf                             liquids that you cannot  Plain Jell-O any favor except red or purple                                           see through such as: Fruit ices (not with fruit pulp)                                     milk, soups, orange juice  Iced Popsicles                                    All solid food Carbonated beverages, regular and diet                                    Cranberry,  grape and apple juices Sports drinks like Gatorade Lightly seasoned clear broth or consume(fat free) Sugar, honey syrup  Sample Menu Breakfast  Lunch                                     Supper Cranberry juice                    Beef broth                            Chicken broth Jell-O                                     Grape juice                           Apple juice Coffee or tea                        Jell-O                                      Popsicle                                                Coffee or tea                        Coffee or tea  _____________________________________________________________________                                  Dennis Bast may not have any metal on your body including hair pins and              piercings  Do not wear jewelry, make-up, lotions, powders or perfumes, deodorant             Do not wear nail polish on your fingernails.  Do not shave  48 hours prior to surgery.               Do not bring valuables to the hospital. [place].  Contacts, dentures or bridgework may not be worn into surgery.  Leave suitcase in the car. After surgery it may be brought to your room.                Please read over the following fact sheets you were given: _____________________________________________________________________             [facility] - Preparing for Surgery Before surgery, you can play an important role.  Because skin is not sterile, your skin needs to be as free of germs as possible.  You can reduce the number of germs on your skin by washing with CHG (chlorahexidine gluconate) soap before surgery.  CHG is an antiseptic cleaner which kills germs and bonds with the skin to continue killing germs even after washing. Please DO NOT use if you have an allergy to CHG or antibacterial soaps.  If your skin becomes reddened/irritated stop  using the CHG and inform your  nurse when you arrive at Short Stay. Do not shave (including legs and underarms) for at least 48 hours prior to the first CHG shower.  You may shave your face/neck. Please follow these instructions carefully:  1.  Shower with CHG Soap the night before surgery and the  morning of Surgery.  2.  If you choose to wash your hair, wash your hair first as usual with your  normal  shampoo.  3.  After you shampoo, rinse your hair and body thoroughly to remove the  shampoo.                           4.  Use CHG as you would any other liquid soap.  You can apply chg directly  to the skin and wash                       Gently with a scrungie or clean washcloth.  5.  Apply the CHG Soap to your body ONLY FROM THE NECK DOWN.   Do not use on face/ open                           Wound or open sores. Avoid contact with eyes, ears mouth and genitals (private parts).                       Wash face,  Genitals (private parts) with your normal soap.             6.  Wash thoroughly, paying special attention to the area where your surgery  will be performed.  7.  Thoroughly rinse your body with warm water from the neck down.  8.  DO NOT shower/wash with your normal soap after using and rinsing off  the CHG Soap.                9.  Pat yourself dry with a clean towel.            10.  Wear clean pajamas.            11.  Place clean sheets on your bed the night of your first shower and do not  sleep with pets. Day of Surgery : Do not apply any lotions/deodorants the morning of surgery.  Please wear clean clothes to the hospital/surgery center.  FAILURE TO FOLLOW THESE INSTRUCTIONS MAY RESULT IN THE CANCELLATION OF YOUR SURGERY PATIENT SIGNATURE_________________________________  NURSE SIGNATURE__________________________________  ________________________________________________________________________   Rogelia Mire  An incentive spirometer is a tool that can help keep your lungs clear and active. This tool  measures how well you are filling your lungs with each breath. Taking long deep breaths may help reverse or decrease the chance of developing breathing (pulmonary) problems (especially infection) following:  A long period of time when you are unable to move or be active. BEFORE THE PROCEDURE   If the spirometer includes an indicator to show your best effort, your nurse or respiratory therapist will set it to a desired goal.  If possible, sit up straight or lean slightly forward. Try not to slouch.  Hold the incentive spirometer in an upright position. INSTRUCTIONS FOR USE  1. Sit on the edge of your bed if possible, or sit up as far as you can in bed or on a chair. 2. Hold the incentive  spirometer in an upright position. 3. Breathe out normally. 4. Place the mouthpiece in your mouth and seal your lips tightly around it. 5. Breathe in slowly and as deeply as possible, raising the piston or the ball toward the top of the column. 6. Hold your breath for 3-5 seconds or for as long as possible. Allow the piston or ball to fall to the bottom of the column. 7. Remove the mouthpiece from your mouth and breathe out normally. 8. Rest for a few seconds and repeat Steps 1 through 7 at least 10 times every 1-2 hours when you are awake. Take your time and take a few normal breaths between deep breaths. 9. The spirometer may include an indicator to show your best effort. Use the indicator as a goal to work toward during each repetition. 10. After each set of 10 deep breaths, practice coughing to be sure your lungs are clear. If you have an incision (the cut made at the time of surgery), support your incision when coughing by placing a pillow or rolled up towels firmly against it. Once you are able to get out of bed, walk around indoors and cough well. You may stop using the incentive spirometer when instructed by your caregiver.  RISKS AND COMPLICATIONS  Take your time so you do not get dizzy or  light-headed.  If you are in pain, you may need to take or ask for pain medication before doing incentive spirometry. It is harder to take a deep breath if you are having pain. AFTER USE  Rest and breathe slowly and easily.  It can be helpful to keep track of a log of your progress. Your caregiver can provide you with a simple table to help with this. If you are using the spirometer at home, follow these instructions: SEEK MEDICAL CARE IF:   You are having difficultly using the spirometer.  You have trouble using the spirometer as often as instructed.  Your pain medication is not giving enough relief while using the spirometer.  You develop fever of 100.5 F (38.1 C) or higher. SEEK IMMEDIATE MEDICAL CARE IF:   You cough up bloody sputum that had not been present before.  You develop fever of 102 F (38.9 C) or greater.  You develop worsening pain at or near the incision site. MAKE SURE YOU:   Understand these instructions.  Will watch your condition.  Will get help right away if you are not doing well or get worse. Document Released: 10/20/2006 Document Revised: 09/01/2011 Document Reviewed: 12/21/2006 ExitCare Patient Information 2014 [employer], [place].   ________________________________________________________________________  WHAT IS A BLOOD TRANSFUSION? Blood Transfusion Information  A transfusion is the replacement of blood or some of its parts. Blood is made up of multiple cells which provide different functions.  Red blood cells carry oxygen and are used for blood loss replacement.  White blood cells fight against infection.  Platelets control bleeding.  Plasma helps clot blood.  Other blood products are available for specialized needs, such as hemophilia or other clotting disorders. BEFORE THE TRANSFUSION  Who gives blood for transfusions?   Healthy volunteers who are fully evaluated to make sure their blood is safe. This is blood bank  blood. Transfusion therapy is the safest it has ever been in the practice of medicine. Before blood is taken from a donor, a complete history is taken to make sure that person has no history of diseases nor engages in risky social behavior (examples are intravenous drug use or  sexual activity with multiple partners). The donor's travel history is screened to minimize risk of transmitting infections, such as malaria. The donated blood is tested for signs of infectious diseases, such as HIV and hepatitis. The blood is then tested to be sure it is compatible with you in order to minimize the chance of a transfusion reaction. If you or a relative donates blood, this is often done in anticipation of surgery and is not appropriate for emergency situations. It takes many days to process the donated blood. RISKS AND COMPLICATIONS Although transfusion therapy is very safe and saves many lives, the main dangers of transfusion include:   Getting an infectious disease.  Developing a transfusion reaction. This is an allergic reaction to something in the blood you were given. Every precaution is taken to prevent this. The decision to have a blood transfusion has been considered carefully by your caregiver before blood is given. Blood is not given unless the benefits outweigh the risks. AFTER THE TRANSFUSION  Right after receiving a blood transfusion, you will usually feel much better and more energetic. This is especially true if your red blood cells have gotten low (anemic). The transfusion raises the level of the red blood cells which carry oxygen, and this usually causes an energy increase.  The nurse administering the transfusion will monitor you carefully for complications. HOME CARE INSTRUCTIONS  No special instructions are needed after a transfusion. You may find your energy is better. Speak with your caregiver about any limitations on activity for underlying diseases you may have. SEEK MEDICAL CARE IF:    Your condition is not improving after your transfusion.  You develop redness or irritation at the intravenous (IV) site. SEEK IMMEDIATE MEDICAL CARE IF:  Any of the following symptoms occur over the next 12 hours:  Shaking chills.  You have a temperature by mouth above 102 F (38.9 C), not controlled by medicine.  Chest, back, or muscle pain.  People around you feel you are not acting correctly or are confused.  Shortness of breath or difficulty breathing.  Dizziness and fainting.  You get a rash or develop hives.  You have a decrease in urine output.  Your urine turns a dark color or changes to pink, red, or brown. Any of the following symptoms occur over the next 10 days:  You have a temperature by mouth above 102 F (38.9 C), not controlled by medicine.  Shortness of breath.  Weakness after normal activity.  The white part of the eye turns yellow (jaundice).  You have a decrease in the amount of urine or are urinating less often.  Your urine turns a dark color or changes to pink, red, or brown. Document Released: 06/06/2000 Document Revised: 09/01/2011 Document Reviewed: 01/24/2008 [facility] Patient Information 2014 [place], [place].  _______________________________________________________________________

## 2019-03-30 DIAGNOSIS — G894 Chronic pain syndrome: Secondary | ICD-10-CM | POA: Diagnosis not present

## 2019-03-30 DIAGNOSIS — Z1389 Encounter for screening for other disorder: Secondary | ICD-10-CM | POA: Diagnosis not present

## 2019-03-30 DIAGNOSIS — M5415 Radiculopathy, thoracolumbar region: Secondary | ICD-10-CM | POA: Diagnosis not present

## 2019-03-30 DIAGNOSIS — M545 Low back pain: Secondary | ICD-10-CM | POA: Diagnosis not present

## 2019-03-30 DIAGNOSIS — M329 Systemic lupus erythematosus, unspecified: Secondary | ICD-10-CM | POA: Diagnosis not present

## 2019-03-30 NOTE — H&P (Signed)
TOTAL KNEE ADMISSION H&P  Patient is being admitted for left total knee arthroplasty.  Subjective:  Chief Complaint:left knee pain.  HPI: Shannon [name], 48 y.o. female, has a history of pain and functional disability in the left knee due to arthritis and has failed non-surgical conservative treatments for greater than 12 weeks to includecorticosteriod injections, viscosupplementation injections and activity modification.  Onset of symptoms was gradual, starting 3 years ago with gradually worsening course since that time. The patient noted no past surgery on the left knee(s).  Patient currently rates pain in the left knee(s) at 8 out of 10 with activity. Patient has worsening of pain with activity and weight bearing and pain that interferes with activities of daily living.  Patient has evidence of patella alta and near bone-on-bone patellofemoral changes associated with that. She has medial joint space narrowing bilaterally. by imaging studies. There is no active infection.  Patient Active Problem List   Diagnosis Date Noted  . Acute bronchitis 02/13/2017  . Cough variant asthma  vs uacs  12/17/2016  . Morbid obesity due to excess calories (HCC) 12/17/2016  . Contracture of left elbow 11/21/2016  . Primary osteoarthritis of both knees 11/21/2016  . Pain in right hand 09/09/2016  . Pain in left hand 09/09/2016  . History of osteoarthritis 09/09/2016  . Rheumatoid factor positive 08/01/2016  . High risk medication use 01/16/2015  . G6PD deficiency 10/31/2014  . Dyspnea 08/23/2014  . ILD (interstitial lung disease) (HCC) 08/23/2014  . Other organ or system involvement in systemic lupus erythematosus (HCC) 08/23/2014   Past Medical History:  Diagnosis Date  . Anemia   . G6PD deficiency   . ILD (interstitial lung disease) (HCC)   . Lupus (HCC)   . Rheumatoid arthritis [facility](HCC)     Past Surgical History:  Procedure Laterality Date  . APPENDECTOMY  1980  . CARDIAC CATHETERIZATION N/A  05/10/2015   Procedure: Right Heart Cath;  Surgeon: Laurey Moralealton S McLean, MD;  [facility];  Service: Cardiovascular;  Laterality: N/A;  . CESAREAN SECTION  '95, '02, '07   X 3  . CHOLECYSTECTOMY  2010  . LAPAROSCOPIC HYSTERECTOMY  10/2015   have ovaries  . RIGHT HEART CATH N/A 08/20/2017   Procedure: RIGHT HEART CATH;  Surgeon: Laurey MoraleMcLean, Dalton S, MD;  [facility];  Service: Cardiovascular;  Laterality: N/A;  . TUBAL LIGATION      No current facility-administered medications for this encounter.    Current Outpatient Medications  Medication Sig Dispense Refill Last Dose  . albuterol (VENTOLIN HFA) 108 (90 Base) MCG/ACT inhaler Inhale 2 puffs into the lungs every 6 (six) hours as needed for wheezing or shortness of breath. 1 Inhaler 5   . buPROPion (WELLBUTRIN XL) 150 MG 24 hr tablet Take 150 mg by mouth daily.   1   . busPIRone (BUSPAR) 10 MG tablet Take 10 mg by mouth daily.     . famotidine (PEPCID) 20 MG tablet Take 1 tablet (20 mg total) by mouth daily. 90 tablet 0   . hydroxychloroquine (PLAQUENIL) 200 MG tablet Take 400 mg by mouth daily at 12 noon.     Marland Kitchen. ibuprofen (ADVIL,MOTRIN) 200 MG tablet Take 400-600 mg by mouth every 6 (six) hours as needed for moderate pain.      . mycophenolate (CELLCEPT) 500 MG tablet Take 2 tablets by mouth twice daily (Patient taking differently: Take 1,000 mg by mouth 2 (two) times daily. ) 360 tablet 0   .  oxyCODONE (OXY IR/ROXICODONE) 5 MG immediate release tablet Take 5 mg by mouth 2 (two) times daily.      . predniSONE (DELTASONE) 1 MG tablet TAKE 3 TABLETS BY MOUTH ONCE DAILY WITH BREAKFAST (Patient taking differently: Take 3 mg by mouth daily with breakfast. ) 90 tablet 0   . QUEtiapine (SEROQUEL) 100 MG tablet Take 150 mg by mouth at bedtime.     . sertraline (ZOLOFT) 100 MG tablet Take 100 mg by mouth at bedtime.   0   . SUMAtriptan (IMITREX) 100 MG tablet Take 100 mg by mouth every 2 (two) hours as needed for migraine.    1   . amLODipine (NORVASC) 5 MG tablet Take 1 tablet (5 mg total) by mouth daily. (Patient not taking: Reported on 03/29/2019) 30 tablet 3 Not Taking at Unknown time  . clotrimazole (LOTRIMIN) 1 % cream Apply 1 application topically 2 (two) times daily. (Patient not taking: Reported on 03/29/2019) 60 g 0 Not Taking at Unknown time  . K Phos Mono-Sod Phos Di & Mono (PHOSPHA 250 NEUTRAL) 155-852-130 MG TABS Take 2 tablets (500 mg total) by mouth daily. (Patient not taking: Reported on 03/29/2019) 60 each 0 Not Taking at Unknown time  . magic mouthwash w/lidocaine SOLN Take 5 mLs by mouth 3 (three) times daily. Swish and spit (Patient not taking: Reported on 03/29/2019) 100 mL 0 Not Taking at Unknown time  . magnesium oxide (MAG-OX) 400 MG tablet Take 1 tablet (400 mg total) by mouth daily. (Patient not taking: Reported on 03/29/2019) 30 tablet 0 Not Taking at Unknown time  . Potassium Chloride ER 20 MEQ TBCR Take 20 mEq by mouth daily. (Patient not taking: Reported on 03/29/2019) 30 tablet 0 Not Taking at Unknown time  . ranitidine (ZANTAC) 300 MG tablet Take 1 tablet (300 mg total) by mouth at bedtime. (Patient not taking: Reported on 03/29/2019) 30 tablet 5 Not Taking at Unknown time   Allergies  Allergen Reactions  . Asa [Aspirin] Hives and Shortness Of Breath    Chest tightness   . Penicillins Hives    Has patient had a PCN reaction causing immediate rash, facial/tongue/throat swelling, SOB or lightheadedness with hypotension: Unknown Has patient had a PCN reaction causing severe rash involving mucus membranes or skin necrosis: Unknown Has patient had a PCN reaction that required hospitalization: Unknown Has patient had a PCN reaction occurring within the last 10 years: No If all of the above answers are "NO", then may proceed with Cephalosporin use.   . Sulfa Antibiotics     G6PD deficiency     Social History   Tobacco Use  . Smoking status: Former Smoker    Packs/day: 0.50    Years: 14.00     Pack years: 7.00    Types: Cigarettes    Quit date: 09/25/2014    Years since quitting: 4.5  . Smokeless tobacco: Never Used  Substance Use Topics  . Alcohol use: No    Alcohol/week: 0.0 standard drinks    Family History  Problem Relation Age of Onset  . Sarcoidosis Mother   . Lupus Sister   . Healthy Daughter   . Healthy Son   . Healthy Son      Review of Systems  Constitutional: Negative for chills and fever.  Respiratory: Negative for cough and shortness of breath.   Cardiovascular: Negative for chest pain and palpitations.  Gastrointestinal: Negative for nausea and vomiting.  Musculoskeletal: Positive for joint pain.    Objective:  Physical Exam Patient is a 48 year old female.  Well nourished and well developed. General: Alert and oriented x3, cooperative and pleasant, no acute distress. Head: normocephalic, atraumatic, neck supple. Eyes: EOMI. Respiratory: breath sounds clear in all fields, no wheezing, rales, or rhonchi. Cardiovascular: Regular rate and rhythm, no murmurs, gallops or rubs. Abdomen: non-tender to palpation and soft, normoactive bowel sounds.  Musculoskeletal: Right Knee Exam: No effusion. Range of motion is 0-125 degrees. Severe patellofemoral crepitus on range of motion of the knee, not as significant as on the contralateral side. No medial or lateral joint line tenderness. Stable knee.  Left Knee Exam: No effusion. Range of motion is 0-125 degrees. Severe patellofemoral crepitus on range of motion of the knee, more significant than on the contralateral side Slight medial joint line tenderness. Lateral joint line tenderness. Stable knee.  Calves soft and nontender. Motor function intact in LE. Strength 5/5 LE bilaterally. Neuro: Distal pulses 2+. Sensation to light touch intact in LE.  Vital signs in last 24 hours: Labs:   Estimated body mass index is 36.77 kg/m as calculated from the following:   Height as of 06/03/18: 5\' 9"   (1.753 m).   Weight as of 06/03/18: 112.9 kg.   Imaging Review Plain radiographs demonstrate severe degenerative joint disease of the left knee(s). The overall alignment isneutral. The bone quality appears to be adequate for age and reported activity level.  Assessment/Plan:  End stage arthritis, left knee   The patient history, physical examination, clinical judgment of the provider and imaging studies are consistent with end stage degenerative joint disease of the left knee(s) and total knee arthroplasty is deemed medically necessary. The treatment options including medical management, injection therapy arthroscopy and arthroplasty were discussed at length. The risks and benefits of total knee arthroplasty were presented and reviewed. The risks due to aseptic loosening, infection, stiffness, patella tracking problems, thromboembolic complications and other imponderables were discussed. The patient acknowledged the explanation, agreed to proceed with the plan and consent was signed. Patient is being admitted for inpatient treatment for surgery, pain control, PT, OT, prophylactic antibiotics, VTE prophylaxis, progressive ambulation and ADL's and discharge planning. The patient is planning to be discharged home.  Therapy Plans: outpatient therapy at [facility] on 04/07/19 Disposition: Home with husband Planned DVT Prophylaxis: Xarelto 10mg  daily DME needed: walker and 3-n-1 PCP: [name], PA-C, clearance received TXA: IV Allergies: aspirin - hives, PCN - hives, sulfa drugs - G6PD deficiency Anesthesia Concerns: none BMI: 36.9  Other: History of G6PD deficiency, Lupus. In pain management for knees/back, takes Oxycodone 5 BID at home. Takes prednisone 3mg  daily at home for Lupus management.   Patient's anticipated LOS is less than 2 midnights, meeting these requirements: - Younger than 67 - Lives within 1 hour of care - Has a competent adult at home to recover with post-op recover -  NO history of  - Chronic pain requiring opiods  - Diabetes  - Coronary Artery Disease  - Heart failure  - Heart attack  - Stroke  - DVT/VTE  - Cardiac arrhythmia  - Respiratory Failure/COPD  - Renal failure  - Advanced Liver disease   - Patient was instructed on what medications to stop prior to surgery. - Follow-up visit in 2 weeks with Dr. - Begin physical therapy following surgery - Pre-operative lab work as pre-surgical testing - Prescriptions will be provided in hospital at time of discharge  , PA-C Orthopedic Surgery [facility] [place] [phone]

## 2019-03-31 ENCOUNTER — Encounter (HOSPITAL_COMMUNITY): Payer: Self-pay

## 2019-03-31 ENCOUNTER — Other Ambulatory Visit: Payer: Self-pay

## 2019-03-31 ENCOUNTER — Encounter (HOSPITAL_COMMUNITY)
Admission: RE | Admit: 2019-03-31 | Discharge: 2019-03-31 | Disposition: A | Discharge status: Home / Self Care | Payer: Medicare HMO | Source: Ambulatory Visit | Attending: Orthopedic Surgery | Admitting: Orthopedic Surgery

## 2019-03-31 ENCOUNTER — Other Ambulatory Visit (HOSPITAL_COMMUNITY)
Admission: RE | Admit: 2019-03-31 | Discharge: 2019-03-31 | Disposition: A | Discharge status: Home / Self Care | Payer: Medicare HMO | Source: Ambulatory Visit | Attending: Orthopedic Surgery | Admitting: Orthopedic Surgery

## 2019-03-31 DIAGNOSIS — Z01812 Encounter for preprocedural laboratory examination: Secondary | ICD-10-CM | POA: Insufficient documentation

## 2019-03-31 DIAGNOSIS — M1712 Unilateral primary osteoarthritis, left knee: Secondary | ICD-10-CM | POA: Diagnosis not present

## 2019-03-31 DIAGNOSIS — Z20828 Contact with and (suspected) exposure to other viral communicable diseases: Secondary | ICD-10-CM | POA: Insufficient documentation

## 2019-03-31 HISTORY — DX: Anxiety disorder, unspecified: F41.9

## 2019-03-31 LAB — CBC WITH DIFFERENTIAL/PLATELET
Abs Immature Granulocytes: 0.06 10*3/uL (ref 0.00–0.07)
Basophils Absolute: 0 10*3/uL (ref 0.0–0.1)
Basophils Relative: 0 %
Eosinophils Absolute: 0.1 10*3/uL (ref 0.0–0.5)
Eosinophils Relative: 2 %
HCT: 33 % — ABNORMAL LOW (ref 36.0–46.0)
Hemoglobin: 10 g/dL — ABNORMAL LOW (ref 12.0–15.0)
Immature Granulocytes: 1 %
Lymphocytes Relative: 27 %
Lymphs Abs: 1.2 10*3/uL (ref 0.7–4.0)
MCH: 29.5 pg (ref 26.0–34.0)
MCHC: 30.3 g/dL (ref 30.0–36.0)
MCV: 97.3 fL (ref 80.0–100.0)
Monocytes Absolute: 0.4 10*3/uL (ref 0.1–1.0)
Monocytes Relative: 8 %
Neutro Abs: 2.9 10*3/uL (ref 1.7–7.7)
Neutrophils Relative %: 62 %
Platelets: 226 10*3/uL (ref 150–400)
RBC: 3.39 MIL/uL — ABNORMAL LOW (ref 3.87–5.11)
RDW: 12.8 % (ref 11.5–15.5)
WBC: 4.7 10*3/uL (ref 4.0–10.5)
nRBC: 0 % (ref 0.0–0.2)

## 2019-03-31 LAB — COMPREHENSIVE METABOLIC PANEL
ALT: 18 U/L (ref 0–44)
AST: 22 U/L (ref 15–41)
Albumin: 3.8 g/dL (ref 3.5–5.0)
Alkaline Phosphatase: 79 U/L (ref 38–126)
Anion gap: 9 (ref 5–15)
BUN: 16 mg/dL (ref 6–20)
CO2: 25 mmol/L (ref 22–32)
Calcium: 9.1 mg/dL (ref 8.9–10.3)
Chloride: 105 mmol/L (ref 98–111)
Creatinine, Ser: 0.72 mg/dL (ref 0.44–1.00)
GFR calc Af Amer: >60 mL/min (ref >60)
GFR calc non Af Amer: >60 mL/min (ref >60)
Glucose, Bld: 84 mg/dL (ref 70–99)
Potassium: 4.1 mmol/L (ref 3.5–5.1)
Sodium: 139 mmol/L (ref 135–145)
Total Bilirubin: 0.2 mg/dL — ABNORMAL LOW (ref 0.3–1.2)
Total Protein: 7.6 g/dL (ref 6.5–8.1)

## 2019-03-31 LAB — PROTIME-INR
INR: 1 (ref 0.8–1.2)
Prothrombin Time: 12.7 seconds (ref 11.4–15.2)

## 2019-03-31 LAB — SURGICAL PCR SCREEN
MRSA, PCR: NEGATIVE
Staphylococcus aureus: NEGATIVE

## 2019-03-31 LAB — ABO/RH: ABO/RH(D): A POS

## 2019-03-31 LAB — APTT: aPTT: 29 seconds (ref 24–36)

## 2019-03-31 NOTE — Progress Notes (Addendum)
PCP - Janine Limbo, PA Cardiologist -many years ago Dr. Aundra Dubin 2016   Rheumatologist-[facility]  Dr. Leata Mouse Doss  01/12/2019 telemedicine visit. Instructed patient to contact Rheumatologist to ask her when she is to stop the Plaquenil. Patient verbalized understanding.  Chest x-ray - none EKG - 03/16/2019 on chart from [place], [place], 08/05/2017-on chart from [place], [place] Stress Test - 04/04/2015 ECHO - 07/15/2017 Cardiac Cath - 08/20/2017-right heart cath, 05/10/2015-right cardiac cath 04/04/2015-Pulmonary exercise test Labs - 03/16/2019 from [place], [place] on chart-CBC w/diff., CMP, HgA1C, PT/INR, U/A  Sleep Study - yes, earlier than 2017 in [place], [place] she thinks CPAP - none  Fasting Blood Sugar - none Checks Blood Sugar _____ times a day  Blood Thinner Instructions:none Aspirin Instructions:none Last Dose:  Anesthesia review:  Chart given to Ward Givens to review- CBC (hgb-10)  Patient has history of Rheumatoid Arthritis, Lupus, Interstitial lung Disease  Patient denies shortness of breath, fever, cough and chest pain at PAT appointment   Patient verbalized understanding of instructions that were given to them at the PAT appointment. Patient was also instructed that they will need to review over the PAT instructions again at home before surgery.

## 2019-04-01 LAB — NOVEL CORONAVIRUS, NAA (HOSP ORDER, SEND-OUT TO REF LAB; TAT 18-24 HRS): SARS-CoV-2, NAA: NOT DETECTED

## 2019-04-03 MED ORDER — BUPIVACAINE LIPOSOME 1.3 % IJ SUSP
20.0000 mL | Freq: Once | INTRAMUSCULAR | Status: DC
  Filled 2019-04-03: qty 20

## 2019-04-03 MED ORDER — VANCOMYCIN HCL 10 G IV SOLR
1500.0000 mg | INTRAVENOUS | Status: AC
  Administered 2019-04-04: 13:00:00 1500 mg via INTRAVENOUS
  Filled 2019-04-03: qty 1500

## 2019-04-04 ENCOUNTER — Ambulatory Visit (HOSPITAL_COMMUNITY)
Admission: RE | Admit: 2019-04-04 | Discharge: 2019-04-06 | Disposition: A | Discharge status: Home / Self Care | Payer: Medicare HMO | Attending: Orthopedic Surgery | Admitting: Orthopedic Surgery

## 2019-04-04 ENCOUNTER — Ambulatory Visit (HOSPITAL_COMMUNITY): Payer: Medicare HMO | Admitting: Physician Assistant

## 2019-04-04 ENCOUNTER — Other Ambulatory Visit: Payer: Self-pay

## 2019-04-04 ENCOUNTER — Encounter (HOSPITAL_COMMUNITY): Payer: Self-pay | Admitting: *Deleted

## 2019-04-04 ENCOUNTER — Encounter (HOSPITAL_COMMUNITY)
Admission: RE | Disposition: A | Discharge status: Home / Self Care | Payer: Self-pay | Source: Home / Self Care | Attending: Orthopedic Surgery

## 2019-04-04 ENCOUNTER — Ambulatory Visit (HOSPITAL_COMMUNITY): Payer: Medicare HMO | Admitting: Anesthesiology

## 2019-04-04 DIAGNOSIS — G8918 Other acute postprocedural pain: Secondary | ICD-10-CM | POA: Diagnosis not present

## 2019-04-04 DIAGNOSIS — Z6838 Body mass index (BMI) 38.0-38.9, adult: Secondary | ICD-10-CM | POA: Insufficient documentation

## 2019-04-04 DIAGNOSIS — J45909 Unspecified asthma, uncomplicated: Secondary | ICD-10-CM | POA: Insufficient documentation

## 2019-04-04 DIAGNOSIS — F419 Anxiety disorder, unspecified: Secondary | ICD-10-CM | POA: Diagnosis not present

## 2019-04-04 DIAGNOSIS — Z7952 Long term (current) use of systemic steroids: Secondary | ICD-10-CM | POA: Insufficient documentation

## 2019-04-04 DIAGNOSIS — M069 Rheumatoid arthritis, unspecified: Secondary | ICD-10-CM | POA: Diagnosis not present

## 2019-04-04 DIAGNOSIS — J849 Interstitial pulmonary disease, unspecified: Secondary | ICD-10-CM | POA: Insufficient documentation

## 2019-04-04 DIAGNOSIS — Z880 Allergy status to penicillin: Secondary | ICD-10-CM | POA: Diagnosis not present

## 2019-04-04 DIAGNOSIS — M179 Osteoarthritis of knee, unspecified: Secondary | ICD-10-CM

## 2019-04-04 DIAGNOSIS — Z886 Allergy status to analgesic agent status: Secondary | ICD-10-CM | POA: Diagnosis not present

## 2019-04-04 DIAGNOSIS — M170 Bilateral primary osteoarthritis of knee: Secondary | ICD-10-CM | POA: Insufficient documentation

## 2019-04-04 DIAGNOSIS — R69 Illness, unspecified: Secondary | ICD-10-CM | POA: Diagnosis not present

## 2019-04-04 DIAGNOSIS — Z79891 Long term (current) use of opiate analgesic: Secondary | ICD-10-CM | POA: Insufficient documentation

## 2019-04-04 DIAGNOSIS — Z87891 Personal history of nicotine dependence: Secondary | ICD-10-CM | POA: Insufficient documentation

## 2019-04-04 DIAGNOSIS — M3210 Systemic lupus erythematosus, organ or system involvement unspecified: Secondary | ICD-10-CM | POA: Diagnosis not present

## 2019-04-04 DIAGNOSIS — Z79899 Other long term (current) drug therapy: Secondary | ICD-10-CM | POA: Diagnosis not present

## 2019-04-04 DIAGNOSIS — Z882 Allergy status to sulfonamides status: Secondary | ICD-10-CM | POA: Insufficient documentation

## 2019-04-04 DIAGNOSIS — M1710 Unilateral primary osteoarthritis, unspecified knee: Secondary | ICD-10-CM

## 2019-04-04 DIAGNOSIS — M1712 Unilateral primary osteoarthritis, left knee: Secondary | ICD-10-CM | POA: Diagnosis not present

## 2019-04-04 HISTORY — PX: TOTAL KNEE ARTHROPLASTY: SHX125

## 2019-04-04 LAB — TYPE AND SCREEN
ABO/RH(D): A POS
Antibody Screen: NEGATIVE

## 2019-04-04 SURGERY — ARTHROPLASTY, KNEE, TOTAL
Anesthesia: Regional | Site: Knee | Laterality: Left

## 2019-04-04 MED ORDER — SODIUM CHLORIDE (PF) 0.9 % IJ SOLN
INTRAMUSCULAR | Status: AC
  Filled 2019-04-04: qty 10

## 2019-04-04 MED ORDER — BUSPIRONE HCL 5 MG PO TABS
10.0000 mg | ORAL_TABLET | Freq: Every day | ORAL | Status: DC
  Administered 2019-04-05 – 2019-04-06 (×2): 10 mg via ORAL
  Filled 2019-04-04 (×2): qty 2

## 2019-04-04 MED ORDER — DEXAMETHASONE SODIUM PHOSPHATE 10 MG/ML IJ SOLN
8.0000 mg | Freq: Once | INTRAMUSCULAR | Status: AC
  Administered 2019-04-04: 8 mg via INTRAVENOUS

## 2019-04-04 MED ORDER — PROPOFOL 10 MG/ML IV BOLUS
INTRAVENOUS | Status: AC
  Filled 2019-04-04: qty 40

## 2019-04-04 MED ORDER — MYCOPHENOLATE MOFETIL 500 MG PO TABS: 1000.0000 mg | ORAL_TABLET | Freq: Two times a day (BID) | ORAL | Status: DC

## 2019-04-04 MED ORDER — STERILE WATER FOR IRRIGATION IR SOLN
Status: DC | PRN
  Administered 2019-04-04: 2000 mL

## 2019-04-04 MED ORDER — ACETAMINOPHEN 10 MG/ML IV SOLN
1000.0000 mg | Freq: Four times a day (QID) | INTRAVENOUS | Status: DC
  Administered 2019-04-04: 1000 mg via INTRAVENOUS
  Filled 2019-04-04: qty 100

## 2019-04-04 MED ORDER — METOCLOPRAMIDE HCL 5 MG PO TABS: 5.0000 mg | ORAL_TABLET | Freq: Three times a day (TID) | ORAL | Status: DC | PRN

## 2019-04-04 MED ORDER — PREDNISONE 5 MG PO TABS
10.0000 mg | ORAL_TABLET | Freq: Two times a day (BID) | ORAL | Status: AC
  Administered 2019-04-05: 10 mg via ORAL
  Filled 2019-04-04: qty 2

## 2019-04-04 MED ORDER — ONDANSETRON HCL 4 MG/2ML IJ SOLN
4.0000 mg | Freq: Four times a day (QID) | INTRAMUSCULAR | Status: DC | PRN
  Administered 2019-04-06: 4 mg via INTRAVENOUS
  Filled 2019-04-04: qty 2

## 2019-04-04 MED ORDER — DOCUSATE SODIUM 100 MG PO CAPS
100.0000 mg | ORAL_CAPSULE | Freq: Two times a day (BID) | ORAL | Status: DC
  Administered 2019-04-04 – 2019-04-06 (×4): 100 mg via ORAL
  Filled 2019-04-04 (×4): qty 1

## 2019-04-04 MED ORDER — ONDANSETRON HCL 4 MG/2ML IJ SOLN
INTRAMUSCULAR | Status: AC
  Filled 2019-04-04: qty 2

## 2019-04-04 MED ORDER — ONDANSETRON HCL 4 MG PO TABS: 4.0000 mg | ORAL_TABLET | Freq: Four times a day (QID) | ORAL | Status: DC | PRN

## 2019-04-04 MED ORDER — LACTATED RINGERS IV SOLN
INTRAVENOUS | Status: DC
  Administered 2019-04-04 (×2): via INTRAVENOUS

## 2019-04-04 MED ORDER — BISACODYL 10 MG RE SUPP: 10.0000 mg | Freq: Every day | RECTAL | Status: DC | PRN

## 2019-04-04 MED ORDER — PROPOFOL 500 MG/50ML IV EMUL
INTRAVENOUS | Status: DC | PRN
  Administered 2019-04-04: 75 ug/kg/min via INTRAVENOUS

## 2019-04-04 MED ORDER — POVIDONE-IODINE 10 % EX SWAB
2.0000 "application " | Freq: Once | CUTANEOUS | Status: AC
  Administered 2019-04-04: 2 via TOPICAL

## 2019-04-04 MED ORDER — GABAPENTIN 300 MG PO CAPS
300.0000 mg | ORAL_CAPSULE | Freq: Three times a day (TID) | ORAL | Status: DC
  Administered 2019-04-04 – 2019-04-06 (×6): 300 mg via ORAL
  Filled 2019-04-04 (×6): qty 1

## 2019-04-04 MED ORDER — SODIUM CHLORIDE 0.9 % IR SOLN
Status: DC | PRN
  Administered 2019-04-04: 1000 mL

## 2019-04-04 MED ORDER — TRANEXAMIC ACID-NACL 1000-0.7 MG/100ML-% IV SOLN
1000.0000 mg | INTRAVENOUS | Status: AC
  Administered 2019-04-04: 1000 mg via INTRAVENOUS
  Filled 2019-04-04: qty 100

## 2019-04-04 MED ORDER — FLEET ENEMA 7-19 GM/118ML RE ENEM: 1.0000 | ENEMA | Freq: Once | RECTAL | Status: DC | PRN

## 2019-04-04 MED ORDER — SERTRALINE HCL 100 MG PO TABS
100.0000 mg | ORAL_TABLET | Freq: Every day | ORAL | Status: DC
  Administered 2019-04-04 – 2019-04-05 (×2): 100 mg via ORAL
  Filled 2019-04-04 (×2): qty 1

## 2019-04-04 MED ORDER — ALBUTEROL SULFATE (2.5 MG/3ML) 0.083% IN NEBU: 2.5000 mg | INHALATION_SOLUTION | Freq: Four times a day (QID) | RESPIRATORY_TRACT | Status: DC | PRN

## 2019-04-04 MED ORDER — FENTANYL CITRATE (PF) 100 MCG/2ML IJ SOLN: 25.0000 ug | INTRAMUSCULAR | Status: DC | PRN

## 2019-04-04 MED ORDER — SODIUM CHLORIDE (PF) 0.9 % IJ SOLN
INTRAMUSCULAR | Status: AC
  Filled 2019-04-04: qty 50

## 2019-04-04 MED ORDER — PREDNISONE 5 MG PO TABS
5.0000 mg | ORAL_TABLET | Freq: Two times a day (BID) | ORAL | Status: AC
  Administered 2019-04-06: 5 mg via ORAL
  Filled 2019-04-04: qty 1

## 2019-04-04 MED ORDER — BUPIVACAINE IN DEXTROSE 0.75-8.25 % IT SOLN
INTRATHECAL | Status: DC | PRN
  Administered 2019-04-04: 1.6 mL via INTRATHECAL

## 2019-04-04 MED ORDER — RIVAROXABAN 10 MG PO TABS
10.0000 mg | ORAL_TABLET | Freq: Every day | ORAL | Status: DC
  Administered 2019-04-05 – 2019-04-06 (×2): 10 mg via ORAL
  Filled 2019-04-04 (×2): qty 1

## 2019-04-04 MED ORDER — DEXAMETHASONE SODIUM PHOSPHATE 10 MG/ML IJ SOLN
INTRAMUSCULAR | Status: AC
  Filled 2019-04-04: qty 1

## 2019-04-04 MED ORDER — PROPOFOL 10 MG/ML IV BOLUS
INTRAVENOUS | Status: AC
  Filled 2019-04-04: qty 20

## 2019-04-04 MED ORDER — FAMOTIDINE 20 MG PO TABS
20.0000 mg | ORAL_TABLET | Freq: Every day | ORAL | Status: DC
  Administered 2019-04-05 – 2019-04-06 (×2): 20 mg via ORAL
  Filled 2019-04-04 (×2): qty 1

## 2019-04-04 MED ORDER — METHOCARBAMOL 500 MG PO TABS
500.0000 mg | ORAL_TABLET | Freq: Four times a day (QID) | ORAL | Status: DC | PRN
  Filled 2019-04-04: qty 1

## 2019-04-04 MED ORDER — POLYETHYLENE GLYCOL 3350 17 G PO PACK: 17.0000 g | PACK | Freq: Every day | ORAL | Status: DC | PRN

## 2019-04-04 MED ORDER — MENTHOL 3 MG MT LOZG: 1.0000 | LOZENGE | OROMUCOSAL | Status: DC | PRN

## 2019-04-04 MED ORDER — SODIUM CHLORIDE (PF) 0.9 % IJ SOLN
INTRAMUSCULAR | Status: DC | PRN
  Administered 2019-04-04: 60 mL

## 2019-04-04 MED ORDER — PREDNISONE 20 MG PO TABS
20.0000 mg | ORAL_TABLET | Freq: Once | ORAL | Status: AC
  Administered 2019-04-04: 20 mg via ORAL
  Filled 2019-04-04: qty 1

## 2019-04-04 MED ORDER — OXYCODONE HCL 5 MG PO TABS
5.0000 mg | ORAL_TABLET | ORAL | Status: DC | PRN
  Administered 2019-04-04: 5 mg via ORAL
  Filled 2019-04-04 (×3): qty 1

## 2019-04-04 MED ORDER — MORPHINE SULFATE (PF) 2 MG/ML IV SOLN
1.0000 mg | INTRAVENOUS | Status: DC | PRN
  Administered 2019-04-04: 2 mg via INTRAVENOUS
  Filled 2019-04-04: qty 1

## 2019-04-04 MED ORDER — SUMATRIPTAN SUCCINATE 50 MG PO TABS
100.0000 mg | ORAL_TABLET | ORAL | Status: DC | PRN
  Filled 2019-04-04: qty 2

## 2019-04-04 MED ORDER — QUETIAPINE FUMARATE 50 MG PO TABS
150.0000 mg | ORAL_TABLET | Freq: Every day | ORAL | Status: DC
  Administered 2019-04-04 – 2019-04-05 (×2): 150 mg via ORAL
  Filled 2019-04-04 (×2): qty 3

## 2019-04-04 MED ORDER — SODIUM CHLORIDE 0.9 % IV SOLN
INTRAVENOUS | Status: DC
  Administered 2019-04-04 – 2019-04-05 (×2): via INTRAVENOUS

## 2019-04-04 MED ORDER — METOCLOPRAMIDE HCL 5 MG/ML IJ SOLN: 5.0000 mg | Freq: Three times a day (TID) | INTRAMUSCULAR | Status: DC | PRN

## 2019-04-04 MED ORDER — ALBUTEROL SULFATE HFA 108 (90 BASE) MCG/ACT IN AERS: 2.0000 | INHALATION_SPRAY | Freq: Four times a day (QID) | RESPIRATORY_TRACT | Status: DC | PRN

## 2019-04-04 MED ORDER — BUPROPION HCL ER (XL) 150 MG PO TB24
150.0000 mg | ORAL_TABLET | Freq: Every day | ORAL | Status: DC
  Administered 2019-04-05 – 2019-04-06 (×2): 150 mg via ORAL
  Filled 2019-04-04 (×2): qty 1

## 2019-04-04 MED ORDER — DEXAMETHASONE SODIUM PHOSPHATE 4 MG/ML IJ SOLN
INTRAMUSCULAR | Status: DC | PRN
  Administered 2019-04-04: 4 mg via PERINEURAL

## 2019-04-04 MED ORDER — BUPIVACAINE LIPOSOME 1.3 % IJ SUSP
INTRAMUSCULAR | Status: DC | PRN
  Administered 2019-04-04: 20 mL

## 2019-04-04 MED ORDER — ROPIVACAINE HCL 5 MG/ML IJ SOLN
INTRAMUSCULAR | Status: DC | PRN
  Administered 2019-04-04: 20 mL via PERINEURAL

## 2019-04-04 MED ORDER — ACETAMINOPHEN 500 MG PO TABS
1000.0000 mg | ORAL_TABLET | Freq: Four times a day (QID) | ORAL | Status: AC
  Administered 2019-04-04 – 2019-04-05 (×4): 1000 mg via ORAL
  Filled 2019-04-04 (×4): qty 2

## 2019-04-04 MED ORDER — OXYCODONE HCL 5 MG PO TABS
10.0000 mg | ORAL_TABLET | ORAL | Status: DC | PRN
  Administered 2019-04-04: 10 mg via ORAL
  Administered 2019-04-05 (×2): 15 mg via ORAL
  Administered 2019-04-05: 10 mg via ORAL
  Administered 2019-04-05 – 2019-04-06 (×7): 15 mg via ORAL
  Filled 2019-04-04: qty 3
  Filled 2019-04-04: qty 2
  Filled 2019-04-04: qty 3
  Filled 2019-04-04: qty 2
  Filled 2019-04-04 (×3): qty 3
  Filled 2019-04-04: qty 2
  Filled 2019-04-04 (×2): qty 3
  Filled 2019-04-04: qty 2

## 2019-04-04 MED ORDER — ONDANSETRON HCL 4 MG/2ML IJ SOLN
INTRAMUSCULAR | Status: DC | PRN
  Administered 2019-04-04: 4 mg via INTRAVENOUS

## 2019-04-04 MED ORDER — DIPHENHYDRAMINE HCL 12.5 MG/5ML PO ELIX: 12.5000 mg | ORAL_SOLUTION | ORAL | Status: DC | PRN

## 2019-04-04 MED ORDER — MIDAZOLAM HCL 2 MG/2ML IJ SOLN
1.0000 mg | INTRAMUSCULAR | Status: DC
  Administered 2019-04-04: 2 mg via INTRAVENOUS
  Filled 2019-04-04: qty 2

## 2019-04-04 MED ORDER — METHOCARBAMOL 500 MG IVPB - SIMPLE MED
500.0000 mg | Freq: Four times a day (QID) | INTRAVENOUS | Status: DC | PRN
  Filled 2019-04-04: qty 50

## 2019-04-04 MED ORDER — FENTANYL CITRATE (PF) 100 MCG/2ML IJ SOLN
50.0000 ug | INTRAMUSCULAR | Status: DC
  Administered 2019-04-04: 100 ug via INTRAVENOUS
  Filled 2019-04-04: qty 2

## 2019-04-04 MED ORDER — DEXAMETHASONE SODIUM PHOSPHATE 10 MG/ML IJ SOLN
10.0000 mg | Freq: Once | INTRAMUSCULAR | Status: AC
  Administered 2019-04-05: 10 mg via INTRAVENOUS
  Filled 2019-04-04: qty 1

## 2019-04-04 MED ORDER — PREDNISONE 1 MG PO TABS: 3.0000 mg | ORAL_TABLET | Freq: Every day | ORAL | Status: DC

## 2019-04-04 MED ORDER — VANCOMYCIN HCL IN DEXTROSE 1-5 GM/200ML-% IV SOLN
1000.0000 mg | Freq: Two times a day (BID) | INTRAVENOUS | Status: AC
  Administered 2019-04-05: 1000 mg via INTRAVENOUS
  Filled 2019-04-04: qty 200

## 2019-04-04 MED ORDER — PHENOL 1.4 % MT LIQD
1.0000 | OROMUCOSAL | Status: DC | PRN
  Filled 2019-04-04: qty 177

## 2019-04-04 MED ORDER — CHLORHEXIDINE GLUCONATE 4 % EX LIQD
60.0000 mL | Freq: Once | CUTANEOUS | Status: AC
  Administered 2019-04-04: 4 via TOPICAL

## 2019-04-04 MED ORDER — MYCOPHENOLATE MOFETIL 250 MG PO CAPS
1000.0000 mg | ORAL_CAPSULE | Freq: Two times a day (BID) | ORAL | Status: DC
  Administered 2019-04-04 – 2019-04-06 (×4): 1000 mg via ORAL
  Filled 2019-04-04 (×5): qty 4

## 2019-04-04 SURGICAL SUPPLY — 61 items
ATTUNE MED DOME PAT 38 KNEE (Knees) ×2 IMPLANT
ATTUNE MED DOME PAT 38MM KNEE (Knees) ×1 IMPLANT
ATTUNE PS FEM LT SZ 7 CEM KNEE (Femur) ×3 IMPLANT
ATTUNE PSRP INSR SZ7 6 KNEE (Insert) ×2 IMPLANT
ATTUNE PSRP INSR SZ7 6MM KNEE (Insert) ×1 IMPLANT
BAG ZIPLOCK 12X15 (MISCELLANEOUS) ×3 IMPLANT
BASE TIBIA ATTUNE KNEE SYS SZ6 (Knees) ×1 IMPLANT
BLADE SAG 18X100X1.27 (BLADE) ×3 IMPLANT
BLADE SAW SGTL 11.0X1.19X90.0M (BLADE) ×3 IMPLANT
BLADE SURG SZ10 CARB STEEL (BLADE) ×6 IMPLANT
BNDG ELASTIC 6X5.8 VLCR STR LF (GAUZE/BANDAGES/DRESSINGS) ×3 IMPLANT
BOWL SMART MIX CTS (DISPOSABLE) ×3 IMPLANT
CEMENT HV SMART SET (Cement) ×3 IMPLANT
CLOSURE WOUND 1/2 X4 (GAUZE/BANDAGES/DRESSINGS) ×2
COVER SURGICAL LIGHT HANDLE (MISCELLANEOUS) ×3 IMPLANT
COVER WAND RF STERILE (DRAPES) IMPLANT
CUFF TOURN SGL QUICK 34 (TOURNIQUET CUFF) ×2
CUFF TRNQT CYL 34X4.125X (TOURNIQUET CUFF) ×1 IMPLANT
DECANTER SPIKE VIAL GLASS SM (MISCELLANEOUS) ×3 IMPLANT
DRAPE U-SHAPE 47X51 STRL (DRAPES) ×3 IMPLANT
DRSG ADAPTIC 3X8 NADH LF (GAUZE/BANDAGES/DRESSINGS) ×3 IMPLANT
DRSG PAD ABDOMINAL 8X10 ST (GAUZE/BANDAGES/DRESSINGS) ×3 IMPLANT
DURAPREP 26ML APPLICATOR (WOUND CARE) ×3 IMPLANT
ELECT REM PT RETURN 15FT ADLT (MISCELLANEOUS) ×3 IMPLANT
EVACUATOR 1/8 PVC DRAIN (DRAIN) ×3 IMPLANT
GAUZE SPONGE 4X4 12PLY STRL (GAUZE/BANDAGES/DRESSINGS) ×3 IMPLANT
GLOVE BIO SURGEON STRL SZ7 (GLOVE) ×3 IMPLANT
GLOVE BIO SURGEON STRL SZ8 (GLOVE) ×3 IMPLANT
GLOVE BIOGEL M STRL SZ7.5 (GLOVE) ×6 IMPLANT
GLOVE BIOGEL PI IND STRL 6.5 (GLOVE) ×1 IMPLANT
GLOVE BIOGEL PI IND STRL 7.0 (GLOVE) ×1 IMPLANT
GLOVE BIOGEL PI IND STRL 8 (GLOVE) ×1 IMPLANT
GLOVE BIOGEL PI INDICATOR 6.5 (GLOVE) ×2
GLOVE BIOGEL PI INDICATOR 7.0 (GLOVE) ×2
GLOVE BIOGEL PI INDICATOR 8 (GLOVE) ×2
GLOVE SURG SS PI 6.5 STRL IVOR (GLOVE) ×3 IMPLANT
GOWN STRL REUS W/TWL LRG LVL3 (GOWN DISPOSABLE) ×9 IMPLANT
HANDPIECE INTERPULSE COAX TIP (DISPOSABLE) ×2
HOLDER FOLEY CATH W/STRAP (MISCELLANEOUS) ×3 IMPLANT
IMMOBILIZER KNEE 20 (SOFTGOODS) ×3
IMMOBILIZER KNEE 20 THIGH 36 (SOFTGOODS) ×1 IMPLANT
KIT TURNOVER KIT A (KITS) IMPLANT
MANIFOLD NEPTUNE II (INSTRUMENTS) ×3 IMPLANT
NS IRRIG 1000ML POUR BTL (IV SOLUTION) ×3 IMPLANT
PACK TOTAL KNEE CUSTOM (KITS) ×3 IMPLANT
PADDING CAST COTTON 6X4 STRL (CAST SUPPLIES) ×6 IMPLANT
PIN DRILL FIX HALF THREAD (BIT) ×3 IMPLANT
PIN STEINMAN FIXATION KNEE (PIN) ×3 IMPLANT
PROTECTOR NERVE ULNAR (MISCELLANEOUS) ×3 IMPLANT
SET HNDPC FAN SPRY TIP SCT (DISPOSABLE) ×1 IMPLANT
STRIP CLOSURE SKIN 1/2X4 (GAUZE/BANDAGES/DRESSINGS) ×4 IMPLANT
SUT MNCRL AB 4-0 PS2 18 (SUTURE) ×3 IMPLANT
SUT STRATAFIX 0 PDS 27 VIOLET (SUTURE) ×3
SUT VIC AB 2-0 CT1 27 (SUTURE) ×6
SUT VIC AB 2-0 CT1 TAPERPNT 27 (SUTURE) ×3 IMPLANT
SUTURE STRATFX 0 PDS 27 VIOLET (SUTURE) ×1 IMPLANT
TIBIA ATTUNE KNEE SYS BASE SZ6 (Knees) ×3 IMPLANT
TRAY FOLEY MTR SLVR 16FR STAT (SET/KITS/TRAYS/PACK) ×3 IMPLANT
WATER STERILE IRR 1000ML POUR (IV SOLUTION) ×6 IMPLANT
WRAP KNEE MAXI GEL POST OP (GAUZE/BANDAGES/DRESSINGS) ×3 IMPLANT
YANKAUER SUCT BULB TIP 10FT TU (MISCELLANEOUS) ×3 IMPLANT

## 2019-04-04 NOTE — Op Note (Signed)
OPERATIVE REPORT-TOTAL KNEE ARTHROPLASTY   Pre-operative diagnosis- Osteoarthritis  Left knee(s)  Post-operative diagnosis- Osteoarthritis Left knee(s)  Procedure-  Left  Total Knee Arthroplasty  Surgeon- Gus Rankin. Yamaira Spinner, MD  Assistant- Leilani Able, PA-C   Anesthesia-  Adductor canal block and spinal  EBL-25 mL   Drains Hemovac  Tourniquet time-  Total Tourniquet Time Documented: Thigh (Left) - 49 minutes Total: Thigh (Left) - 49 minutes     Complications- None  Condition-PACU - hemodynamically stable.   Brief Clinical Note  Shannon [name] is a 48 y.o. year old female with end stage OA of her left knee with progressively worsening pain and dysfunction. She has constant pain, with activity and at rest and significant functional deficits with difficulties even with ADLs. She has had extensive non-op management including analgesics, injections of cortisone and viscosupplements, and home exercise program, but remains in significant pain with significant dysfunction. Radiographs show bone on bone arthritis patellofemoral. She presents now for left Total Knee Arthroplasty.    Procedure in detail---   The patient is brought into the operating room and positioned supine on the operating table. After successful administration of  Adductor canal block and spinal,   a tourniquet is placed high on the Left thigh(s) and the lower extremity is prepped and draped in the usual sterile fashion. Time out is performed by the operating team and then the  Left lower extremity is wrapped in Esmarch, knee flexed and the tourniquet inflated to 300 mmHg.       A midline incision is made with a ten blade through the subcutaneous tissue to the level of the extensor mechanism. A fresh blade is used to make a medial parapatellar arthrotomy. Soft tissue over the proximal medial tibia is subperiosteally elevated to the joint line with a knife and into the semimembranosus bursa with a Cobb elevator. Soft  tissue over the proximal lateral tibia is elevated with attention being paid to avoiding the patellar tendon on the tibial tubercle. The patella is everted, knee flexed 90 degrees and the ACL and PCL are removed. Findings are bone on bone patellofemoral with global osteophytes.        The drill is used to create a starting hole in the distal femur and the canal is thoroughly irrigated with sterile saline to remove the fatty contents. The 5 degree Left  valgus alignment guide is placed into the femoral canal and the distal femoral cutting block is pinned to remove 9 mm off the distal femur. Resection is made with an oscillating saw.      The tibia is subluxed forward and the menisci are removed. The extramedullary alignment guide is placed referencing proximally at the medial aspect of the tibial tubercle and distally along the second metatarsal axis and tibial crest. The block is pinned to remove 31mm off the more deficient medial  side. Resection is made with an oscillating saw. Size 6is the most appropriate size for the tibia and the proximal tibia is prepared with the modular drill and keel punch for that size.      The femoral sizing guide is placed and size 7 is most appropriate. Rotation is marked off the epicondylar axis and confirmed by creating a rectangular flexion gap at 90 degrees. The size 7 cutting block is pinned in this rotation and the anterior, posterior and chamfer cuts are made with the oscillating saw. The intercondylar block is then placed and that cut is made.      Trial size  6 tibial component, trial size 7 posterior stabilized femur and a 6  mm posterior stabilized rotating platform insert trial is placed. Full extension is achieved with excellent varus/valgus and anterior/posterior balance throughout full range of motion. The patella is everted and thickness measured to be 24  mm. Free hand resection is taken to 14 mm, a 38 template is placed, lug holes are drilled, trial patella is  placed, and it tracks normally. Osteophytes are removed off the posterior femur with the trial in place. All trials are removed and the cut bone surfaces prepared with pulsatile lavage. Cement is mixed and once ready for implantation, the size 6 tibial implant, size  7 posterior stabilized femoral component, and the size 38 patella are cemented in place and the patella is held with the clamp. The trial insert is placed and the knee held in full extension. The Exparel (20 ml mixed with 60 ml saline) is injected into the extensor mechanism, posterior capsule, medial and lateral gutters and subcutaneous tissues.  All extruded cement is removed and once the cement is hard the permanent 6 mm posterior stabilized rotating platform insert is placed into the tibial tray.      The wound is copiously irrigated with saline solution and the extensor mechanism closed over a hemovac drain with #1 V-loc suture. The tourniquet is released for a total tourniquet time of 49  minutes. Flexion against gravity is 140 degrees and the patella tracks normally. Subcutaneous tissue is closed with 2.0 vicryl and subcuticular with running 4.0 Monocryl. The incision is cleaned and dried and steri-strips and a bulky sterile dressing are applied. The limb is placed into a knee immobilizer and the patient is awakened and transported to recovery in stable condition.      Please note that a surgical assistant was a medical necessity for this procedure in order to perform it in a safe and expeditious manner. Surgical assistant was necessary to retract the ligaments and vital neurovascular structures to prevent injury to them and also necessary for proper positioning of the limb to allow for anatomic placement of the prosthesis.   Dione Plover Aldrin Engelhard, MD    04/04/2019, 2:32 PM

## 2019-04-04 NOTE — Discharge Instructions (Addendum)
° °Dr. Frank Aluisio °Total Joint Specialist °[facility] °[address] °[place], [place] [address] °[phone] ° °TOTAL KNEE REPLACEMENT POSTOPERATIVE DIRECTIONS ° °Knee Rehabilitation, Guidelines Following Surgery  °Results after knee surgery are often greatly improved when you follow the exercise, range of motion and muscle strengthening exercises prescribed by your doctor. Safety measures are also important to protect the knee from further injury. Any time any of these exercises cause you to have increased pain or swelling in your knee joint, decrease the amount until you are comfortable again and slowly increase them. If you have problems or questions, call your caregiver or physical therapist for advice.  ° °HOME CARE INSTRUCTIONS  °• Remove items at home which could result in a fall. This includes throw rugs or furniture in walking pathways.  °· ICE to the affected knee every three hours for 30 minutes at a time and then as needed for pain and swelling.  Continue to use ice on the knee for pain and swelling from surgery. You may notice swelling that will progress down to the foot and ankle.  This is normal after surgery.  Elevate the leg when you are not up walking on it.   °· Continue to use the breathing machine which will help keep your temperature down.  It is common for your temperature to cycle up and down following surgery, especially at night when you are not up moving around and exerting yourself.  The breathing machine keeps your lungs expanded and your temperature down. °· Do not place pillow under knee, focus on keeping the knee straight while resting ° °DIET °You may resume your previous home diet once your are discharged from the hospital. ° °DRESSING / WOUND CARE / SHOWERING °You may shower 3 days after surgery, but keep the wounds dry during showering.  You may use an occlusive plastic wrap (Press'n Seal for example), NO SOAKING/SUBMERGING IN THE BATHTUB.  If the bandage  gets wet, change with a clean dry gauze.  If the incision gets wet, pat the wound dry with a clean towel. °You may start showering once you are discharged home but do not submerge the incision under water. Just pat the incision dry and apply a dry gauze dressing on daily. °Change the surgical dressing daily and reapply a dry dressing each time. ° °ACTIVITY °Walk with your walker as instructed. °Use walker as long as suggested by your caregivers. °Avoid periods of inactivity such as sitting longer than an hour when not asleep. This helps prevent blood clots.  °You may resume a sexual relationship in one month or when given the OK by your doctor.  °You may return to work once you are cleared by your doctor.  °Do not drive a car for 6 weeks or until released by you surgeon.  °Do not drive while taking narcotics. ° °WEIGHT BEARING °Weight bearing as tolerated with assist device (walker, cane, etc) as directed, use it as long as suggested by your surgeon or therapist, typically at least 4-6 weeks. ° °POSTOPERATIVE CONSTIPATION PROTOCOL °Constipation - defined medically as fewer than three stools per week and severe constipation as less than one stool per week. ° °One of the most common issues patients have following surgery is constipation.  Even if you have a regular bowel pattern at home, your normal regimen is likely to be disrupted due to multiple reasons following surgery.  Combination of anesthesia, postoperative narcotics, change in appetite and fluid intake all can affect your bowels.    In order to avoid complications following surgery, here are some recommendations in order to help you during your recovery period. ° °Colace (docusate) - Pick up an over-the-counter form of Colace or another stool softener and take twice a day as long as you are requiring postoperative pain medications.  Take with a full glass of water daily.  If you experience loose stools or diarrhea, hold the colace until you stool forms back  up.  If your symptoms do not get better within 1 week or if they get worse, check with your doctor. ° °Dulcolax (bisacodyl) - Pick up over-the-counter and take as directed by the product packaging as needed to assist with the movement of your bowels.  Take with a full glass of water.  Use this product as needed if not relieved by Colace only.  ° °MiraLax (polyethylene glycol) - Pick up over-the-counter to have on hand.  MiraLax is a solution that will increase the amount of water in your bowels to assist with bowel movements.  Take as directed and can mix with a glass of water, juice, soda, coffee, or tea.  Take if you go more than two days without a movement. °Do not use MiraLax more than once per day. Call your doctor if you are still constipated or irregular after using this medication for 7 days in a row. ° °If you continue to have problems with postoperative constipation, please contact the office for further assistance and recommendations.  If you experience "the worst abdominal pain ever" or develop nausea or vomiting, please contact the office immediatly for further recommendations for treatment. ° °ITCHING ° If you experience itching with your medications, try taking only a single pain pill, or even half a pain pill at a time.  You can also use Benadryl over the counter for itching or also to help with sleep.  ° °TED HOSE STOCKINGS °Wear the elastic stockings on both legs for three weeks following surgery during the day but you may remove then at night for sleeping. ° °MEDICATIONS °See your medication summary on the “After Visit Summary” that the nursing staff will review with you prior to discharge.  You may have some home medications which will be placed on hold until you complete the course of blood thinner medication.  It is important for you to complete the blood thinner medication as prescribed by your surgeon.  Continue your approved medications as instructed at time of discharge. ° °PRECAUTIONS °If  you experience chest pain or shortness of breath - call 911 immediately for transfer to the hospital emergency department.  °If you develop a fever greater that 101 F, purulent drainage from wound, increased redness or drainage from wound, foul odor from the wound/dressing, or calf pain - CONTACT YOUR SURGEON.   °                                                °FOLLOW-UP APPOINTMENTS °Make sure you keep all of your appointments after your operation with your surgeon and caregivers. You should call the office at the above phone number and make an appointment for approximately two weeks after the date of your surgery or on the date instructed by your surgeon outlined in the "After Visit Summary". ° ° °RANGE OF MOTION AND STRENGTHENING EXERCISES  °Rehabilitation of the knee is important following a knee injury or   an operation. After just a few days of immobilization, the muscles of the thigh which control the knee become weakened and shrink (atrophy). Knee exercises are designed to build up the tone and strength of the thigh muscles and to improve knee motion. Often times heat used for twenty to thirty minutes before working out will loosen up your tissues and help with improving the range of motion but do not use heat for the first two weeks following surgery. These exercises can be done on a training (exercise) mat, on the floor, on a table or on a bed. Use what ever works the best and is most comfortable for you Knee exercises include:  °• Leg Lifts - While your knee is still immobilized in a splint or cast, you can do straight leg raises. Lift the leg to 60 degrees, hold for 3 sec, and slowly lower the leg. Repeat 10-20 times 2-3 times daily. Perform this exercise against resistance later as your knee gets better.  °• Quad and Hamstring Sets - Tighten up the muscle on the front of the thigh (Quad) and hold for 5-10 sec. Repeat this 10-20 times hourly. Hamstring sets are done by pushing the foot backward against an  object and holding for 5-10 sec. Repeat as with quad sets.  °· Leg Slides: Lying on your back, slowly slide your foot toward your buttocks, bending your knee up off the floor (only go as far as is comfortable). Then slowly slide your foot back down until your leg is flat on the floor again. °· Angel Wings: Lying on your back spread your legs to the side as far apart as you can without causing discomfort.  °A rehabilitation program following serious knee injuries can speed recovery and prevent re-injury in the future due to weakened muscles. Contact your doctor or a physical therapist for more information on knee rehabilitation.  ° °IF YOU ARE TRANSFERRED TO A SKILLED REHAB FACILITY °If the patient is transferred to a skilled rehab facility following release from the hospital, a list of the current medications will be sent to the facility for the patient to continue.  When discharged from the skilled rehab facility, please have the facility set up the patient's Home Health Physical Therapy prior to being released. Also, the skilled facility will be responsible for providing the patient with their medications at time of release from the facility to include their pain medication, the muscle relaxants, and their blood thinner medication. If the patient is still at the rehab facility at time of the two week follow up appointment, the skilled rehab facility will also need to assist the patient in arranging follow up appointment in our office and any transportation needs. ° °MAKE SURE YOU:  °• Understand these instructions.  °• Get help right away if you are not doing well or get worse.  ° ° °Pick up stool softner and laxative for home use following surgery while on pain medications. °Do not submerge incision under water. °Please use good hand washing techniques while changing dressing each day. °May shower starting three days after surgery. °Please use a clean towel to pat the incision dry following showers. °Continue to  use ice for pain and swelling after surgery. °Do not use any lotions or creams on the incision until instructed by your surgeon. ° ° °_____________________________________________ ° °Information on my medicine - XARELTO® (Rivaroxaban) ° °This medication education was reviewed with me or my healthcare representative as part of my discharge preparation.  ° °Why was   Xarelto® prescribed for you? °Xarelto® was prescribed for you to reduce the risk of blood clots forming after orthopedic surgery. The medical term for these abnormal blood clots is venous thromboembolism (VTE). ° °What do you need to know about xarelto® ? °Take your Xarelto® ONCE DAILY at the same time every day. °You may take it either with or without food. ° °If you have difficulty swallowing the tablet whole, you may crush it and mix in applesauce just prior to taking your dose. ° °Take Xarelto® exactly as prescribed by your doctor and DO NOT stop taking Xarelto® without talking to the doctor who prescribed the medication.  Stopping without other VTE prevention medication to take the place of Xarelto® may increase your risk of developing a clot. ° °After discharge, you should have regular check-up appointments with your healthcare provider that is prescribing your Xarelto®.   ° °What do you do if you miss a dose? °If you miss a dose, take it as soon as you remember on the same day then continue your regularly scheduled once daily regimen the next day. Do not take two doses of Xarelto® on the same day.  ° °Important Safety Information °A possible side effect of Xarelto® is bleeding. You should call your healthcare provider right away if you experience any of the following: °? Bleeding from an injury or your nose that does not stop. °? Unusual colored urine (red or dark brown) or unusual colored stools (red or black). °? Unusual bruising for unknown reasons. °? A serious fall or if you hit your head (even if there is no bleeding). ° °Some medicines may  interact with Xarelto® and might increase your risk of bleeding while on Xarelto®. To help avoid this, consult your healthcare provider or pharmacist prior to using any new prescription or non-prescription medications, including herbals, vitamins, non-steroidal anti-inflammatory drugs (NSAIDs) and supplements. ° °This website has more information on Xarelto®: www.xarelto.com. ° ° °

## 2019-04-04 NOTE — Transfer of Care (Signed)
Immediate Anesthesia Transfer of Care Note  Patient: Shannon [name]  Procedure(s) Performed: TOTAL KNEE ARTHROPLASTY (Left Knee)  Patient Location: PACU  Anesthesia Type:MAC and Spinal  Level of Consciousness: drowsy, patient cooperative and responds to stimulation  Airway & Oxygen Therapy: Patient Spontanous Breathing and Patient connected to nasal cannula oxygen  Post-op Assessment: Report given to RN and Post -op Vital signs reviewed and stable  Post vital signs: Reviewed and stable  Last Vitals:  Vitals Value Taken Time  BP    Temp    Pulse    Resp    SpO2      Last Pain:  Vitals:   04/04/19 1253  TempSrc:   PainSc: 0-No pain         Complications: No apparent anesthesia complications

## 2019-04-04 NOTE — Anesthesia Procedure Notes (Signed)
Spinal  Patient location during procedure: OR Start time: 04/04/2019 1:06 PM End time: 04/04/2019 1:10 PM Staffing Anesthesiologist: Freddrick March, MD Resident/CRNA: Glory Buff, CRNA Performed: resident/CRNA  Preanesthetic Checklist Completed: patient identified, site marked, surgical consent, pre-op evaluation, timeout performed, IV checked, risks and benefits discussed and monitors and equipment checked Spinal Block Patient position: sitting Prep: DuraPrep Patient monitoring: heart rate, cardiac monitor, continuous pulse ox and blood pressure Approach: midline Location: L3-4 Injection technique: single-shot Needle Needle type: Pencan  Needle gauge: 24 G Needle length: 9 cm Assessment Sensory level: T4 Additional Notes Kit expiration date checked and verified.  Sterile prep and drape, Skin local with 1% lidocaine, - heme, - paraesthesia, + CSF pre and post injection, patient tolerated procedure well.

## 2019-04-04 NOTE — Anesthesia Postprocedure Evaluation (Signed)
Anesthesia Post Note  Patient: Shannon [name]  Procedure(s) Performed: TOTAL KNEE ARTHROPLASTY (Left Knee)     Patient location during evaluation: PACU Anesthesia Type: Regional and Spinal Level of consciousness: oriented and awake and alert Pain management: pain level controlled Vital Signs Assessment: post-procedure vital signs reviewed and stable Respiratory status: spontaneous breathing, respiratory function stable and patient connected to nasal cannula oxygen Cardiovascular status: blood pressure returned to baseline and stable Postop Assessment: no headache, no backache and no apparent nausea or vomiting Anesthetic complications: no    Last Vitals:  Vitals:   04/04/19 1515 04/04/19 1530  BP: (!) 158/99 (!) 137/106  Pulse: (!) 57 (!) 56  Resp: 12 15  Temp:  36.4 C  SpO2: 100% 100%    Last Pain:  Vitals:   04/04/19 1515  TempSrc:   PainSc: 0-No pain                 Miklos Bidinger L Maxxwell Edgett

## 2019-04-04 NOTE — Anesthesia Preprocedure Evaluation (Addendum)
Anesthesia Evaluation  Patient identified by MRN, date of birth, ID band Patient awake    Reviewed: Allergy & Precautions, NPO status , Patient's Chart, lab work & pertinent test results  Airway Mallampati: III  TM Distance: >3 FB Neck ROM: Full    Dental no notable dental hx. (+) Teeth Intact, Dental Advisory Given   Pulmonary asthma , former smoker,  H/o ILD   Pulmonary exam normal breath sounds clear to auscultation       Cardiovascular negative cardio ROS Normal cardiovascular exam Rhythm:Regular Rate:Normal  TTE 2019 - Left ventricle: The cavity size was normal. Wall thickness was normal. Systolic function was normal. The estimated ejection fraction was in the range of 55% to 60%. Wall motion was normal; there were no regional wall motion abnormalities. Features are consistent with a pseudonormal left ventricular filling pattern, with concomitant abnormal relaxation and increased filling pressure (grade 2 diastolic dysfunction). - Mitral valve: There was mild regurgitation. - Left atrium: The atrium was mildly dilated. - Right atrium: The atrium was mildly dilated. - Pulmonary arteries: Systolic pressure was mildly increased. PA peak pressure: 35 mm Hg (S).  Stress Test 2016 Conclusion: Exercise testing with gas-exchange demonstrates a severe functional impairment when compared to matched sedentary norms. At peak exercise, it appears the patient is severely Limited. Despite having restrictive lung disease with ILD she did  not have ventilatory limitation or desaturation though she was somewhat tachypneic compared to norm. Limitation could be circulatory with excess deadspace suggestive of this. Clinical correlation recommended   RHC 2019 RA mean 3 RV 30/6 PA 23/8, mean 14 PCWP mean 8 Oxygen saturations: PA 72% AO 98% Cardiac Output (Fick) 6.73  Cardiac Index (Fick) 3.06 Cardiac Output (Thermo) 6.94 Cardiac Index  (Thermo) 3.15   Neuro/Psych PSYCHIATRIC DISORDERS Anxiety negative neurological ROS     GI/Hepatic negative GI ROS, Neg liver ROS,   Endo/Other  Morbid obesitySLE  Renal/GU negative Renal ROSG6PD deficiency  negative genitourinary   Musculoskeletal  (+) Arthritis , Rheumatoid disorders,    Abdominal   Peds  Hematology  (+) Blood dyscrasia (Hgb 10), anemia ,   Anesthesia Other Findings   Reproductive/Obstetrics                            Anesthesia Physical Anesthesia Plan  ASA: III  Anesthesia Plan: Spinal and Regional   Post-op Pain Management:  Regional for Post-op pain   Induction:   PONV Risk Score and Plan: 2 and Treatment may vary due to age or medical condition and Propofol infusion  Airway Management Planned: Natural Airway  Additional Equipment:   Intra-op Plan:   Post-operative Plan:   Informed Consent: I have reviewed the patients History and Physical, chart, labs and discussed the procedure including the risks, benefits and alternatives for the proposed anesthesia with the patient or authorized representative who has indicated his/her understanding and acceptance.     Dental advisory given  Plan Discussed with: CRNA  Anesthesia Plan Comments:         Anesthesia Quick Evaluation

## 2019-04-04 NOTE — Progress Notes (Signed)
Assisted Dr. Chelsey Woodrum with left, ultrasound guided, adductor canal block. Side rails up, monitors on throughout procedure. See vital signs in flow sheet. Tolerated Procedure well.  

## 2019-04-04 NOTE — Evaluation (Addendum)
Physical Therapy Evaluation Patient Details Name: Shannon [name] MRN: [id number] DOB: [birth year] Today's Date: 04/04/2019   History of Present Illness  Patient is 48 y.o female s/p Lt TKA on 04/04/19 with PMH significant for Lupus, anxiety, and RA.  Clinical Impression  Shannon [name] is a 48 y.o. female POD 0 s/p Lt TKA. Patient reports independence with mobility at baseline. Patient is now limited by functional impairments (see PT problem list below) and requires min assist for transfers and gait with RW. Patient was able to ambulate ~20 feet with RW and was limited greatly by pain this session. Gait limited also by nausea and seated rest break provided with cool cloths. PT reported reduction in symptoms with seated rest and RN alerted to need for pain medicine. Patient instructed in exercise to facilitate ROM for extension and circulation. Patient will benefit from continued skilled PT interventions to address impairments and progress towards PLOF. Acute PT will follow to progress mobility and stair training in preparation for safe discharge home.     Follow Up Recommendations Follow surgeon's recommendation for DC plan and follow-up therapies    Equipment Recommendations  Rolling walker with 5" wheels;3in1 (PT)    Recommendations for Other Services       Precautions / Restrictions Precautions Precautions: Fall Restrictions Weight Bearing Restrictions: No LLE Weight Bearing: Weight bearing as tolerated      Mobility  Bed Mobility Overal bed mobility: Needs Assistance Bed Mobility: Supine to Sit     Supine to sit: Min assist;HOB elevated     General bed mobility comments: pt using bed rails, assist for LE mobility  Transfers Overall transfer level: Needs assistance Equipment used: Rolling walker (2 wheeled) Transfers: Sit to/from Stand Sit to Stand: Min assist;From elevated surface         General transfer comment: cues for safe hand placement and technique, assist  required for power up and to steady upon rising  Ambulation/Gait Ambulation/Gait assistance: Min assist Gait Distance (Feet): 20 Feet Assistive device: Rolling walker (2 wheeled) Gait Pattern/deviations: Step-to pattern;Decreased step length - right;Decreased step length - left;Decreased stride length;Decreased stance time - left;Decreased weight shift to left;Antalgic Gait velocity: decreased   General Gait Details: pt heavily reliant on UE support to unweight Lt LE due to pain, pt requried cues for hand placement and safety and to maintain safe distance from [employer] Mobility    Modified Rankin (Stroke Patients Only)       Balance Overall balance assessment: Needs assistance Sitting-balance support: Feet supported;No upper extremity supported Sitting balance-Leahy Scale: Good     Standing balance support: Bilateral upper extremity supported;During functional activity Standing balance-Leahy Scale: Poor                Pertinent Vitals/Pain Pain Assessment: Faces Faces Pain Scale: Hurts whole lot Pain Location: Lt knee Pain Descriptors / Indicators: Sore;Aching Pain Intervention(s): Limited activity within patient's tolerance;Monitored during session;Patient requesting pain meds-RN notified;Ice applied;Repositioned    Home Living Family/patient expects to be discharged to:: Private residence Living Arrangements: Spouse/significant other Available Help at Discharge: Family(pt has husband home as long as needed and 89 and 46 year old children at home) Type of Home: House Home Access: Stairs to enter Entrance Stairs-Rails: None Entrance Stairs-Number of Steps: 3 at garage no rail (4 at front no rails) Home Layout: Two level;Bed/bath upstairs Home Equipment: Toilet riser;Shower seat - built in;Shower seat;Grab bars - tub/shower  Prior Function Level of Independence: Independent               Hand Dominance         Extremity/Trunk Assessment   Upper Extremity Assessment Upper Extremity Assessment: Overall WFL for tasks assessed    Lower Extremity Assessment Lower Extremity Assessment: RLE deficits/detail RLE Deficits / Details: light touch appears intact grossly, pt with limited SLR but no extensor lag, 3/5 throughout RLE: Unable to fully assess due to pain RLE Sensation: WNL RLE Coordination: WNL    Cervical / Trunk Assessment Cervical / Trunk Assessment: Normal  Communication   Communication: No difficulties  Cognition Arousal/Alertness: Awake/alert Behavior During Therapy: WFL for tasks assessed/performed Overall Cognitive Status: Within Functional Limits for tasks assessed           General Comments      Exercises Total Joint Exercises Ankle Circles/Pumps: AROM;10 reps;Seated;Both Quad Sets: AROM;5 reps;Supine;Left   Assessment/Plan    PT Assessment Patient needs continued PT services  PT Problem List Pain;Decreased strength;Decreased balance;Decreased mobility;Decreased range of motion;Decreased activity tolerance;Decreased coordination;Decreased knowledge of use of DME       PT Treatment Interventions DME instruction;Functional mobility training;Balance training;Patient/family education;Therapeutic activities;Gait training;Stair training;Therapeutic exercise    PT Goals (Current goals can be found in the Care Plan section)  Acute Rehab PT Goals Patient Stated Goal: to return home and be able to walk without pain PT Goal Formulation: With patient Time For Goal Achievement: 04/10/19 Potential to Achieve Goals: Good    Frequency 7X/week    AM-PAC PT "6 Clicks" Mobility  Outcome Measure Help needed turning from your back to your side while in a flat bed without using bedrails?: A Little Help needed moving from lying on your back to sitting on the side of a flat bed without using bedrails?: A Little Help needed moving to and from a bed to a chair (including a  wheelchair)?: A Little Help needed standing up from a chair using your arms (e.g., wheelchair or bedside chair)?: A Little Help needed to walk in hospital room?: A Little Help needed climbing 3-5 steps with a railing? : A Lot 6 Click Score: 17    End of Session Equipment Utilized During Treatment: Gait belt Activity Tolerance: Patient limited by pain Patient left: in chair;with call bell/phone within reach;with family/visitor present;with chair alarm set Nurse Communication: Mobility status PT Visit Diagnosis: Other abnormalities of gait and mobility (R26.89);Muscle weakness (generalized) (M62.81);Difficulty in walking, not elsewhere classified (R26.2);Pain Pain - Right/Left: Left Pain - part of body: Knee    Time: 3710-6269 PT Time Calculation (min) (ACUTE ONLY): 27 min   Charges:   PT Evaluation $PT Eval Low Complexity: 1 Low PT Treatments $Gait Training: 8-22 mins       Valentino Saxon, PT, DPT Physical Therapist with [facility]  04/04/2019 7:24 PM

## 2019-04-04 NOTE — Interval H&P Note (Signed)
History and Physical Interval Note:  04/04/2019 10:28 AM  Shannon [name]  has presented today for surgery, with the diagnosis of left knee osteoarthritis.  The various methods of treatment have been discussed with the patient and family. After consideration of risks, benefits and other options for treatment, the patient has consented to  Procedure(s) with comments: TOTAL KNEE ARTHROPLASTY (Left) - 22min as a surgical intervention.  The patient's history has been reviewed, patient examined, no change in status, stable for surgery.  I have reviewed the patient's chart and labs.  Questions were answered to the patient's satisfaction.     Pilar Plate Aarnav Steagall

## 2019-04-04 NOTE — Anesthesia Procedure Notes (Signed)
Date/Time: 04/04/2019 1:01 PM Performed by: Glory Buff, CRNA Oxygen Delivery Method: Nasal cannula

## 2019-04-04 NOTE — Care Plan (Signed)
Ortho Bundle Case Management Note  Patient Details  Name: Shannon [name] MRN: [id number] Date of Birth: 1970-08-31  L TKA on 04-04-19 DCP:  Home with spouse.  2 story home with 3 ste. DME:  RW and 3-in-1 ordered through [place] PT:  EO.  PT eval scheduled on 04-07-19.                   DME Arranged:  3-N-1, Walker rolling DME Agency:     HH Arranged:  NA HH Agency:  NA  Additional Comments: Please contact me with any questions of if this plan should need to change.  Marianne Sofia, RN,CCM [facility]  [phone] 04/04/2019, 3:52 PM

## 2019-04-04 NOTE — Anesthesia Procedure Notes (Signed)
Anesthesia Regional Block: Adductor canal block   Pre-Anesthetic Checklist: ,, timeout performed, Correct Patient, Correct Site, Correct Laterality, Correct Procedure, Correct Position, site marked, Risks and benefits discussed,  Surgical consent,  Pre-op evaluation,  At surgeon's request and post-op pain management  Laterality: Left  Prep: Maximum Sterile Barrier Precautions used, chloraprep       Needles:  Injection technique: Single-shot  Needle Type: Echogenic Stimulator Needle     Needle Length: 9cm  Needle Gauge: 22     Additional Needles:   Procedures:,,,, ultrasound used (permanent image in chart),,,,  Narrative:  Start time: 04/04/2019 12:24 PM End time: 04/04/2019 12:34 PM Injection made incrementally with aspirations every 5 mL.  Performed by: Personally  Anesthesiologist: Freddrick March, MD  Additional Notes: Monitors applied. No increased pain on injection. No increased resistance to injection. Injection made in 5cc increments. Good needle visualization. Patient tolerated procedure well.

## 2019-04-05 ENCOUNTER — Encounter (HOSPITAL_COMMUNITY): Payer: Self-pay | Admitting: Orthopedic Surgery

## 2019-04-05 DIAGNOSIS — M170 Bilateral primary osteoarthritis of knee: Secondary | ICD-10-CM | POA: Diagnosis not present

## 2019-04-05 LAB — CBC
HCT: 32.9 % — ABNORMAL LOW (ref 36.0–46.0)
Hemoglobin: 10.3 g/dL — ABNORMAL LOW (ref 12.0–15.0)
MCH: 30.6 pg (ref 26.0–34.0)
MCHC: 31.3 g/dL (ref 30.0–36.0)
MCV: 97.6 fL (ref 80.0–100.0)
Platelets: 211 10*3/uL (ref 150–400)
RBC: 3.37 MIL/uL — ABNORMAL LOW (ref 3.87–5.11)
RDW: 13 % (ref 11.5–15.5)
WBC: 9.9 10*3/uL (ref 4.0–10.5)
nRBC: 0 % (ref 0.0–0.2)

## 2019-04-05 LAB — BASIC METABOLIC PANEL
Anion gap: 7 (ref 5–15)
BUN: 14 mg/dL (ref 6–20)
CO2: 23 mmol/L (ref 22–32)
Calcium: 8.8 mg/dL — ABNORMAL LOW (ref 8.9–10.3)
Chloride: 105 mmol/L (ref 98–111)
Creatinine, Ser: 0.81 mg/dL (ref 0.44–1.00)
GFR calc Af Amer: >60 mL/min (ref >60)
GFR calc non Af Amer: >60 mL/min (ref >60)
Glucose, Bld: 135 mg/dL — ABNORMAL HIGH (ref 70–99)
Potassium: 4.1 mmol/L (ref 3.5–5.1)
Sodium: 135 mmol/L (ref 135–145)

## 2019-04-05 MED ORDER — GABAPENTIN 300 MG PO CAPS: 300.0000 mg | ORAL_CAPSULE | Freq: Three times a day (TID) | ORAL | 0 refills | Status: DC

## 2019-04-05 MED ORDER — RIVAROXABAN 10 MG PO TABS: 10.0000 mg | ORAL_TABLET | Freq: Every day | ORAL | 0 refills | Status: DC

## 2019-04-05 MED ORDER — METHOCARBAMOL 500 MG PO TABS: 500.0000 mg | ORAL_TABLET | Freq: Four times a day (QID) | ORAL | 0 refills | Status: DC | PRN

## 2019-04-05 MED ORDER — PREDNISONE 1 MG PO TABS: 3.0000 mg | ORAL_TABLET | Freq: Two times a day (BID) | ORAL | 0 refills | Status: AC

## 2019-04-05 MED ORDER — TIZANIDINE HCL 4 MG PO TABS
4.0000 mg | ORAL_TABLET | Freq: Three times a day (TID) | ORAL | Status: DC | PRN
  Administered 2019-04-06: 4 mg via ORAL
  Filled 2019-04-05 (×2): qty 1

## 2019-04-05 NOTE — Progress Notes (Signed)
Physical Therapy Treatment Patient Details Name: Shannon [name] MRN: [id number] DOB: 1970/09/22 Today's Date: 04/05/2019    History of Present Illness Patient is 48 y.o female s/p Lt TKA on 04/04/19 with PMH significant for Lupus, anxiety, and RA.    PT Comments    Pt assisted back to bed.  Pt reports increased fatigue and tiredness due to little rest last night.  Pt assisted with performing LE exercises in supine.    Follow Up Recommendations  Follow surgeon's recommendation for DC plan and follow-up therapies     Equipment Recommendations  Rolling walker with 5" wheels;3in1 (PT)    Recommendations for Other Services       Precautions / Restrictions Precautions Precautions: Fall;Knee Restrictions LLE Weight Bearing: Weight bearing as tolerated    Mobility  Bed Mobility Overal bed mobility: Needs Assistance Bed Mobility: Sit to Supine     Supine to sit: Min assist Sit to supine: Min assist   General bed mobility comments: assist for L LE  Transfers Overall transfer level: Needs assistance Equipment used: Rolling walker (2 wheeled) Transfers: Sit to/from [employer] Sit to Stand: Min guard Stand pivot transfers: Min guard       General transfer comment: cues for safe hand placement and technique  Ambulation/Gait   Stairs             Wheelchair Mobility    Modified Rankin (Stroke Patients Only)       Balance                                            Cognition Arousal/Alertness: Awake/alert Behavior During Therapy: WFL for tasks assessed/performed Overall Cognitive Status: Within Functional Limits for tasks assessed                                        Exercises Total Joint Exercises Ankle Circles/Pumps: AROM;10 reps;Both Quad Sets: AROM;Left;10 reps Heel Slides: AAROM;Left;10 reps Hip ABduction/ADduction: AAROM;Left;10 reps Goniometric ROM: approx <35* AAROM L knee flexion limited  by pain    General Comments        Pertinent Vitals/Pain Pain Assessment: 0-10 Pain Score: 8  Pain Location: Lt knee Pain Descriptors / Indicators: Sore;Aching Pain Intervention(s): Repositioned;Monitored during session    Home Living                      Prior Function            PT Goals (current goals can now be found in the care plan section) Progress towards PT goals: Progressing toward goals    Frequency    7X/week      PT Plan Current plan remains appropriate    Co-evaluation              AM-PAC PT "6 Clicks" Mobility   Outcome Measure  Help needed turning from your back to your side while in a flat bed without using bedrails?: A Little Help needed moving from lying on your back to sitting on the side of a flat bed without using bedrails?: A Little Help needed moving to and from a bed to a chair (including a wheelchair)?: A Little Help needed standing up from a chair using your arms (e.g., wheelchair or bedside chair)?: A Little Help  needed to walk in hospital room?: A Little Help needed climbing 3-5 steps with a railing? : A Lot 6 Click Score: 17    End of Session Equipment Utilized During Treatment: Gait belt Activity Tolerance: Patient limited by pain Patient left: in bed;with call bell/phone within reach;with bed alarm set Nurse Communication: Mobility status PT Visit Diagnosis: Other abnormalities of gait and mobility (R26.89);Muscle weakness (generalized) (M62.81)     Time: 1324-4010 PT Time Calculation (min) (ACUTE ONLY): 13 min  Charges:   $Therapeutic Exercise: 8-22 mins           Carmelia Bake, PT, DPT Acute Rehabilitation Services Office: [phone] Pager: [phone]    Trena Platt 04/05/2019, 3:09 PM

## 2019-04-05 NOTE — Progress Notes (Signed)
Physical Therapy Treatment Patient Details Name: Shannon [name] MRN: [id number] DOB: July 12, 1970 Today's Date: 04/05/2019    History of Present Illness Patient is 48 y.o female s/p Lt TKA on 04/04/19 with PMH significant for Lupus, anxiety, and RA.    PT Comments    Pt ambulated in hallway however limited by pain and dizziness.  Recliner brought behind pt and vitals obtained and stable (see docflowsheets).   Follow Up Recommendations  Follow surgeon's recommendation for DC plan and follow-up therapies     Equipment Recommendations  Rolling walker with 5" wheels;3in1 (PT)    Recommendations for Other Services       Precautions / Restrictions Precautions Precautions: Fall;Knee Restrictions LLE Weight Bearing: Weight bearing as tolerated    Mobility  Bed Mobility Overal bed mobility: Needs Assistance Bed Mobility: Supine to Sit     Supine to sit: Min assist     General bed mobility comments: verbal cues for sequence, assist for L LE  Transfers Overall transfer level: Needs assistance Equipment used: Rolling walker (2 wheeled) Transfers: Sit to/from Stand Sit to Stand: Min assist;From elevated surface         General transfer comment: cues for safe hand placement and technique, assist required for power up and to steady upon rising  Ambulation/Gait Ambulation/Gait assistance: Min assist Gait Distance (Feet): 120 Feet Assistive device: Rolling walker (2 wheeled) Gait Pattern/deviations: Step-to pattern;Decreased stance time - left;Decreased weight shift to left;Antalgic Gait velocity: decreased   General Gait Details: verbal cues for sequence, pt with difficulty keeping left heel on floor (increased knee flexion throughout stance); slow pace, reliant on UE support   Stairs             Wheelchair Mobility    Modified Rankin (Stroke Patients Only)       Balance                                            Cognition  Arousal/Alertness: Awake/alert Behavior During Therapy: WFL for tasks assessed/performed Overall Cognitive Status: Within Functional Limits for tasks assessed                                        Exercises      General Comments        Pertinent Vitals/Pain Pain Assessment: 0-10 Pain Score: 7  Pain Location: Lt knee Pain Descriptors / Indicators: Sore;Aching Pain Intervention(s): Monitored during session;Repositioned;Ice applied;Limited activity within patient's tolerance    Home Living                      Prior Function            PT Goals (current goals can now be found in the care plan section) Progress towards PT goals: Progressing toward goals    Frequency    7X/week      PT Plan Current plan remains appropriate    Co-evaluation              AM-PAC PT "6 Clicks" Mobility   Outcome Measure  Help needed turning from your back to your side while in a flat bed without using bedrails?: A Little Help needed moving from lying on your back to sitting on the side of a flat bed without using  bedrails?: A Little Help needed moving to and from a bed to a chair (including a wheelchair)?: A Little Help needed standing up from a chair using your arms (e.g., wheelchair or bedside chair)?: A Little Help needed to walk in hospital room?: A Little Help needed climbing 3-5 steps with a railing? : A Lot 6 Click Score: 17    End of Session Equipment Utilized During Treatment: Gait belt Activity Tolerance: Patient limited by pain Patient left: in chair;with call bell/phone within reach;with chair alarm set Nurse Communication: Mobility status PT Visit Diagnosis: Other abnormalities of gait and mobility (R26.89);Muscle weakness (generalized) (M62.81)     Time: 1030-1050 PT Time Calculation (min) (ACUTE ONLY): 20 min  Charges:  $Gait Training: 8-22 mins                    Zenovia Jarred, PT, DPT Acute Rehabilitation Services Office:  [phone] Pager: [phone]  [name] 04/05/2019, 3:01 PM

## 2019-04-05 NOTE — Progress Notes (Signed)
   Subjective: 1 Day Post-Op Procedure(s) (LRB): TOTAL KNEE ARTHROPLASTY (Left) Patient reports pain as moderate.   Patient seen in rounds by Dr. Wynelle Link. Patient is well, and has had no acute complaints or problems other than discomfort in the left knee. No acute events overnight. Patient ambulated 20 feet with PT yesterday, but was limited by discomfort in the knee. Foley catheter removed, positive flatus. Denies CP, SHOB.  We will continue therapy today.   Objective: Vital signs in last 24 hours: Temp:  [97.6 F (36.4 C)-98.7 F (37.1 C)] 98.2 F (36.8 C) (10/13 0540) Pulse Rate:  [56-87] 74 (10/13 0540) Resp:  [9-18] 16 (10/13 0540) BP: (131-162)/(86-113) 131/86 (10/13 0540) SpO2:  [99 %-100 %] 100 % (10/13 0540) Weight:  [126 kg] 126 kg (10/12 1059)  Intake/Output from previous day:  Intake/Output Summary (Last 24 hours) at 04/05/2019 0725 Last data filed at 04/05/2019 0551 Gross per 24 hour  Intake 4647.38 ml  Output 2790 ml  Net 1857.38 ml     Intake/Output this shift: No intake/output data recorded.  Labs: Recent Labs    04/05/19 0621  HGB 10.3*   Recent Labs    04/05/19 0621  WBC 9.9  RBC 3.37*  HCT 32.9*  PLT 211   Recent Labs    04/05/19 0621  NA 135  K 4.1  CL 105  CO2 23  BUN 14  CREATININE 0.81  GLUCOSE 135*  CALCIUM 8.8*   No results for input(s): LABPT, INR in the last 72 hours.  Exam: General - Patient is Alert and Oriented Extremity - Neurologically intact Sensation intact distally Intact pulses distally Dorsiflexion/Plantar flexion intact Dressing - dressing C/D/I Motor Function - intact, moving foot and toes well on exam.   Past Medical History:  Diagnosis Date  . Anemia   . Anxiety   . G6PD deficiency   . ILD (interstitial lung disease) ([place])   . Lupus ([place])   . Rheumatoid arthritis ([facility])     Assessment/Plan: 1 Day Post-Op Procedure(s) (LRB): TOTAL KNEE ARTHROPLASTY (Left) Principal Problem:   OA  (osteoarthritis) of knee  Estimated body mass index is 38.74 kg/m as calculated from the following:   Height as of this encounter: 5\' 11"  (1.803 m).   Weight as of this encounter: 126 kg. Advance diet Up with therapy   Patient's anticipated LOS is less than 2 midnights, meeting these requirements: - Younger than 69 - Lives within 1 hour of care - Has a competent adult at home to recover with post-op recover - NO history of  - Diabetes  - Coronary Artery Disease  - Heart failure  - Heart attack  - Stroke  - DVT/VTE  - Cardiac arrhythmia  - Respiratory Failure/COPD  - Renal failure  - Anemia  - Advanced Liver disease  DVT Prophylaxis - Xarelto Weight bearing as tolerated. D/C O2 and pulse ox and try on room air. Hemovac pulled without difficulty, will begin therapy today.  Plan is to go Home after hospital stay. Possible discharge today after 1-2 sessions of therapy if she is meeting her goals and pain is controlled. We discussed that her use of pain medication at baseline may make pain control more difficult. Scheduled for OPPT at [facility] on 10/15. Follow up in the office in 2 weeks.   Griffith Citron, PA-C Orthopedic Surgery [phone] 04/05/2019, 7:25 AM

## 2019-04-06 DIAGNOSIS — Z96652 Presence of left artificial knee joint: Secondary | ICD-10-CM | POA: Diagnosis not present

## 2019-04-06 DIAGNOSIS — M170 Bilateral primary osteoarthritis of knee: Secondary | ICD-10-CM | POA: Diagnosis not present

## 2019-04-06 LAB — BASIC METABOLIC PANEL
Anion gap: 9 (ref 5–15)
BUN: 17 mg/dL (ref 6–20)
CO2: 23 mmol/L (ref 22–32)
Calcium: 8.5 mg/dL — ABNORMAL LOW (ref 8.9–10.3)
Chloride: 106 mmol/L (ref 98–111)
Creatinine, Ser: 0.78 mg/dL (ref 0.44–1.00)
GFR calc Af Amer: >60 mL/min (ref >60)
GFR calc non Af Amer: >60 mL/min (ref >60)
Glucose, Bld: 126 mg/dL — ABNORMAL HIGH (ref 70–99)
Potassium: 3.8 mmol/L (ref 3.5–5.1)
Sodium: 138 mmol/L (ref 135–145)

## 2019-04-06 LAB — CBC
HCT: 29.7 % — ABNORMAL LOW (ref 36.0–46.0)
Hemoglobin: 9.1 g/dL — ABNORMAL LOW (ref 12.0–15.0)
MCH: 30.1 pg (ref 26.0–34.0)
MCHC: 30.6 g/dL (ref 30.0–36.0)
MCV: 98.3 fL (ref 80.0–100.0)
Platelets: 218 10*3/uL (ref 150–400)
RBC: 3.02 MIL/uL — ABNORMAL LOW (ref 3.87–5.11)
RDW: 13.2 % (ref 11.5–15.5)
WBC: 11.3 10*3/uL — ABNORMAL HIGH (ref 4.0–10.5)
nRBC: 0 % (ref 0.0–0.2)

## 2019-04-06 MED ORDER — TIZANIDINE HCL 4 MG PO TABS: 4.0000 mg | ORAL_TABLET | Freq: Three times a day (TID) | ORAL | 0 refills | Status: DC | PRN

## 2019-04-06 MED ORDER — OXYCODONE HCL 5 MG PO TABS: 5.0000 mg | ORAL_TABLET | Freq: Four times a day (QID) | ORAL | 0 refills | Status: DC | PRN

## 2019-04-06 NOTE — Progress Notes (Signed)
RW and 3 in1 delivered to patient room by [employer].

## 2019-04-06 NOTE — Progress Notes (Addendum)
   Subjective: 2 Days Post-Op Procedure(s) (LRB): TOTAL KNEE ARTHROPLASTY (Left) Patient reports pain as mild.   Patient seen in rounds by Dr. Wynelle Link. Patient is well, and has had no acute complaints or problems other than discomfort in the left knee. Per therapy notes, patient was able to ambulate 120 feet yesterday. No acute events overnight. Voiding without difficulty. Denies CP, SHOB.  Plan is to go Home after hospital stay.  Objective: Vital signs in last 24 hours: Temp:  [97.5 F (36.4 C)-98.5 F (36.9 C)] 98 F (36.7 C) (10/14 0453) Pulse Rate:  [81-97] 91 (10/14 0453) Resp:  [15-18] 18 (10/14 0453) BP: (128-155)/(79-109) 128/79 (10/14 0453) SpO2:  [97 %-100 %] 100 % (10/14 0453)  Intake/Output from previous day:  Intake/Output Summary (Last 24 hours) at 04/06/2019 0758 Last data filed at 04/06/2019 0615 Gross per 24 hour  Intake 1661.41 ml  Output 1700 ml  Net -38.59 ml    Intake/Output this shift: No intake/output data recorded.  Labs: Recent Labs    04/05/19 0621 04/06/19 0256  HGB 10.3* 9.1*   Recent Labs    04/05/19 0621 04/06/19 0256  WBC 9.9 11.3*  RBC 3.37* 3.02*  HCT 32.9* 29.7*  PLT 211 218   Recent Labs    04/05/19 0621 04/06/19 0256  NA 135 138  K 4.1 3.8  CL 105 106  CO2 23 23  BUN 14 17  CREATININE 0.81 0.78  GLUCOSE 135* 126*  CALCIUM 8.8* 8.5*   No results for input(s): LABPT, INR in the last 72 hours.  Exam: General - Patient is Alert and Oriented Extremity - Neurologically intact Sensation intact distally Intact pulses distally Dorsiflexion/Plantar flexion intact Dressing/Incision - clean, dry, no drainage Motor Function - intact, moving foot and toes well on exam.   Past Medical History:  Diagnosis Date  . Anemia   . Anxiety   . G6PD deficiency   . ILD (interstitial lung disease) ([place])   . Lupus ([place])   . Rheumatoid arthritis ([facility])     Assessment/Plan: 2 Days Post-Op Procedure(s) (LRB): TOTAL KNEE  ARTHROPLASTY (Left) Principal Problem:   OA (osteoarthritis) of knee  Estimated body mass index is 38.74 kg/m as calculated from the following:   Height as of this encounter: 5\' 11"  (1.803 m).   Weight as of this encounter: 126 kg. Advance diet Up with therapy D/C IV fluids  DVT Prophylaxis - Xarelto Weight-bearing as tolerated   Plan for discharge home today after 1-2 sessions of therapy if she continues to meet her goals. She is scheduled to start OPPT tomorrow. She will follow up in the office in 2 weeks.   Regarding her prednisone, she will have two doses of Prednisone 3 mg today. I have sent additional prednisone to her pharmacy in case she needs it. She will then resume her normal dose tomorrow.   Griffith Citron, PA-C Orthopedic Surgery [phone] 04/06/2019, 7:58 AM

## 2019-04-06 NOTE — Progress Notes (Signed)
Physical Therapy Treatment Patient Details Name: Shannon [name] MRN: [id number] DOB: [birth year] Today's Date: 04/06/2019    History of Present Illness Patient is 48 y.o female s/p Lt TKA on 04/04/19 with PMH significant for Lupus, anxiety, and RA.    PT Comments    Pt would like to d/c home today despite pain.  She states she feels like her lupus is flaring up, and she would rather be at home.  Pt ambulated short distance to steps and practiced safe technique.  Pt used KI for support and safety this afternoon due to pain and agreeable to wear upon d/c home.  Pt provided with stair handout for family to review as well.  Pt would like to d/c home today.   Follow Up Recommendations  Follow surgeon's recommendation for DC plan and follow-up therapies     Equipment Recommendations  Rolling walker with 5" wheels;3in1 (PT)    Recommendations for Other Services       Precautions / Restrictions Precautions Precautions: Fall;Knee Restrictions LLE Weight Bearing: Weight bearing as tolerated    Mobility  Bed Mobility Overal bed mobility: Needs Assistance Bed Mobility: Supine to Sit     Supine to sit: Mod assist     General bed mobility comments: pt in recliner  Transfers Overall transfer level: Needs assistance Equipment used: Rolling walker (2 wheeled) Transfers: Sit to/from Stand Sit to Stand: Min assist         General transfer comment: cues for safe hand placement and technique; assist to rise and steady  Ambulation/Gait Ambulation/Gait assistance: Min assist Gait Distance (Feet): 16 Feet Assistive device: Rolling walker (2 wheeled) Gait Pattern/deviations: Step-to pattern;Decreased stance time - left;Decreased weight shift to left;Antalgic Gait velocity: decreased   General Gait Details: verbal cues for RW positioning, step length; pt only able to tolerate short distance to steps due to pain (recliner waiting)   Stairs Stairs: Yes Stairs assistance: Min  guard Stair Management: Step to pattern;Backwards;With walker Number of Stairs: 3 General stair comments: verbal cues for sequence, safety, RW positioning; recommended pt have some hold RW and some assist/steady ready behind her at home; slow and painful however pt able to perform without physical assist; provided handout for family to review (none present at time of session)   Wheelchair Mobility    Modified Rankin (Stroke Patients Only)       Balance                                            Cognition Arousal/Alertness: Awake/alert Behavior During Therapy: WFL for tasks assessed/performed Overall Cognitive Status: Within Functional Limits for tasks assessed                                        Exercises    General Comments        Pertinent Vitals/Pain Pain Assessment: 0-10 Pain Score: 9  Pain Location: Lt knee Pain Descriptors / Indicators: Sore;Aching Pain Intervention(s): Repositioned;Monitored during session;Ice applied    Home Living                      Prior Function            PT Goals (current goals can now be found in the care plan section) Progress towards  PT goals: Progressing toward goals    Frequency    7X/week      PT Plan Current plan remains appropriate    Co-evaluation              AM-PAC PT "6 Clicks" Mobility   Outcome Measure  Help needed turning from your back to your side while in a flat bed without using bedrails?: A Little Help needed moving from lying on your back to sitting on the side of a flat bed without using bedrails?: A Little Help needed moving to and from a bed to a chair (including a wheelchair)?: A Little Help needed standing up from a chair using your arms (e.g., wheelchair or bedside chair)?: A Little Help needed to walk in hospital room?: A Little Help needed climbing 3-5 steps with a railing? : A Little 6 Click Score: 18    End of Session Equipment  Utilized During Treatment: Gait belt Activity Tolerance: Patient limited by pain Patient left: in chair;with call bell/phone within reach Nurse Communication: Mobility status PT Visit Diagnosis: Other abnormalities of gait and mobility (R26.89);Muscle weakness (generalized) (M62.81)     Time: 3845-3646 PT Time Calculation (min) (ACUTE ONLY): 16 min  Charges:  $Gait Training: 8-22 mins            Zenovia Jarred, PT, DPT Acute Rehabilitation Services Office: [phone] Pager: [phone]  [name] 04/06/2019, 2:32 PM

## 2019-04-06 NOTE — Progress Notes (Signed)
Physical Therapy Treatment Patient Details Name: Shannon [name] MRN: [id number] DOB: 1970/07/11 Today's Date: 04/06/2019    History of Present Illness Patient is 48 y.o female s/p Lt TKA on 04/04/19 with PMH significant for Lupus, anxiety, and RA.    PT Comments    Pt performed LE exercises in bed.  Pt attempted to ambulate however only able to tolerate short distance due to pain.  Will return this afternoon to practice stairs.   Follow Up Recommendations  Follow surgeon's recommendation for DC plan and follow-up therapies     Equipment Recommendations  Rolling walker with 5" wheels;3in1 (PT)    Recommendations for Other Services       Precautions / Restrictions Precautions Precautions: Fall;Knee Restrictions LLE Weight Bearing: Weight bearing as tolerated    Mobility  Bed Mobility Overal bed mobility: Needs Assistance Bed Mobility: Supine to Sit     Supine to sit: Mod assist     General bed mobility comments: assist for L LE; pt also struggling with upper body today due to leg pain (RN assisted trunk)  Transfers Overall transfer level: Needs assistance Equipment used: Rolling walker (2 wheeled) Transfers: Sit to/from Stand Sit to Stand: Min assist         General transfer comment: cues for safe hand placement and technique; assist to rise and steady  Ambulation/Gait Ambulation/Gait assistance: Min assist Gait Distance (Feet): 24 Feet Assistive device: Rolling walker (2 wheeled) Gait Pattern/deviations: Step-to pattern;Decreased stance time - left;Decreased weight shift to left;Antalgic     General Gait Details: verbal cues for RW positioning, step length; pt only able to tolerate short distance due to pain   Stairs             Wheelchair Mobility    Modified Rankin (Stroke Patients Only)       Balance                                            Cognition Arousal/Alertness: Awake/alert Behavior During Therapy: WFL for  tasks assessed/performed Overall Cognitive Status: Within Functional Limits for tasks assessed                                        Exercises Total Joint Exercises Ankle Circles/Pumps: AROM;10 reps;Both Quad Sets: AROM;Left;10 reps Heel Slides: AAROM;Left;10 reps Hip ABduction/ADduction: AAROM;Left;10 reps    General Comments        Pertinent Vitals/Pain Pain Assessment: 0-10 Pain Score: 7  Pain Location: Lt knee Pain Descriptors / Indicators: Sore;Aching Pain Intervention(s): Monitored during session;Repositioned;RN gave pain meds during session    Home Living                      Prior Function            PT Goals (current goals can now be found in the care plan section) Progress towards PT goals: Progressing toward goals    Frequency    7X/week      PT Plan Current plan remains appropriate    Co-evaluation              AM-PAC PT "6 Clicks" Mobility   Outcome Measure  Help needed turning from your back to your side while in a flat bed without using bedrails?: A  Little Help needed moving from lying on your back to sitting on the side of a flat bed without using bedrails?: A Little Help needed moving to and from a bed to a chair (including a wheelchair)?: A Little Help needed standing up from a chair using your arms (e.g., wheelchair or bedside chair)?: A Little Help needed to walk in hospital room?: A Little Help needed climbing 3-5 steps with a railing? : A Little 6 Click Score: 18    End of Session Equipment Utilized During Treatment: Gait belt Activity Tolerance: Patient limited by pain Patient left: in bed;with call bell/phone within reach;with bed alarm set Nurse Communication: Mobility status PT Visit Diagnosis: Other abnormalities of gait and mobility (R26.89);Muscle weakness (generalized) (M62.81)     Time: 3474-2595 PT Time Calculation (min) (ACUTE ONLY): 22 min  Charges:  $Therapeutic Exercise: 8-22  mins                     Zenovia Jarred, PT, DPT Acute Rehabilitation Services Office: [phone] Pager: [phone]  [name] 04/06/2019, 12:51 PM

## 2019-04-07 DIAGNOSIS — M25662 Stiffness of left knee, not elsewhere classified: Secondary | ICD-10-CM | POA: Diagnosis not present

## 2019-04-07 DIAGNOSIS — M25562 Pain in left knee: Secondary | ICD-10-CM | POA: Diagnosis not present

## 2019-04-08 DIAGNOSIS — M25662 Stiffness of left knee, not elsewhere classified: Secondary | ICD-10-CM | POA: Insufficient documentation

## 2019-04-11 DIAGNOSIS — M25662 Stiffness of left knee, not elsewhere classified: Secondary | ICD-10-CM | POA: Diagnosis not present

## 2019-04-11 DIAGNOSIS — R69 Illness, unspecified: Secondary | ICD-10-CM | POA: Diagnosis not present

## 2019-04-11 DIAGNOSIS — F411 Generalized anxiety disorder: Secondary | ICD-10-CM | POA: Diagnosis not present

## 2019-04-11 DIAGNOSIS — M25562 Pain in left knee: Secondary | ICD-10-CM | POA: Diagnosis not present

## 2019-04-13 DIAGNOSIS — M3213 Lung involvement in systemic lupus erythematosus: Secondary | ICD-10-CM | POA: Diagnosis not present

## 2019-04-15 DIAGNOSIS — M25562 Pain in left knee: Secondary | ICD-10-CM | POA: Diagnosis not present

## 2019-04-20 DIAGNOSIS — M25562 Pain in left knee: Secondary | ICD-10-CM | POA: Diagnosis not present

## 2019-04-20 DIAGNOSIS — M25662 Stiffness of left knee, not elsewhere classified: Secondary | ICD-10-CM | POA: Diagnosis not present

## 2019-04-22 DIAGNOSIS — M25562 Pain in left knee: Secondary | ICD-10-CM | POA: Diagnosis not present

## 2019-04-22 DIAGNOSIS — M25662 Stiffness of left knee, not elsewhere classified: Secondary | ICD-10-CM | POA: Diagnosis not present

## 2019-04-26 DIAGNOSIS — M25562 Pain in left knee: Secondary | ICD-10-CM | POA: Diagnosis not present

## 2019-04-28 DIAGNOSIS — M25562 Pain in left knee: Secondary | ICD-10-CM | POA: Diagnosis not present

## 2019-05-03 DIAGNOSIS — M25562 Pain in left knee: Secondary | ICD-10-CM | POA: Diagnosis not present

## 2019-05-04 DIAGNOSIS — M329 Systemic lupus erythematosus, unspecified: Secondary | ICD-10-CM | POA: Diagnosis not present

## 2019-05-04 DIAGNOSIS — M545 Low back pain: Secondary | ICD-10-CM | POA: Diagnosis not present

## 2019-05-04 DIAGNOSIS — G894 Chronic pain syndrome: Secondary | ICD-10-CM | POA: Diagnosis not present

## 2019-05-04 DIAGNOSIS — Z1389 Encounter for screening for other disorder: Secondary | ICD-10-CM | POA: Diagnosis not present

## 2019-05-06 DIAGNOSIS — M25562 Pain in left knee: Secondary | ICD-10-CM | POA: Diagnosis not present

## 2019-05-10 DIAGNOSIS — Z96652 Presence of left artificial knee joint: Secondary | ICD-10-CM | POA: Diagnosis not present

## 2019-05-10 DIAGNOSIS — M25562 Pain in left knee: Secondary | ICD-10-CM | POA: Diagnosis not present

## 2019-05-10 DIAGNOSIS — Z471 Aftercare following joint replacement surgery: Secondary | ICD-10-CM | POA: Diagnosis not present

## 2019-05-12 DIAGNOSIS — M25562 Pain in left knee: Secondary | ICD-10-CM | POA: Diagnosis not present

## 2019-05-13 ENCOUNTER — Other Ambulatory Visit: Payer: Self-pay

## 2019-05-13 ENCOUNTER — Ambulatory Visit (INDEPENDENT_AMBULATORY_CARE_PROVIDER_SITE_OTHER): Payer: Medicare HMO | Admitting: Internal Medicine

## 2019-05-13 ENCOUNTER — Encounter: Payer: Self-pay | Admitting: Internal Medicine

## 2019-05-13 VITALS — BP 134/90 | HR 97 | Ht 71.0 in | Wt 275.2 lb

## 2019-05-13 DIAGNOSIS — Z79899 Other long term (current) drug therapy: Secondary | ICD-10-CM

## 2019-05-13 DIAGNOSIS — J849 Interstitial pulmonary disease, unspecified: Secondary | ICD-10-CM

## 2019-05-13 DIAGNOSIS — J8489 Other specified interstitial pulmonary diseases: Secondary | ICD-10-CM

## 2019-05-13 DIAGNOSIS — M359 Systemic involvement of connective tissue, unspecified: Secondary | ICD-10-CM | POA: Diagnosis not present

## 2019-05-13 DIAGNOSIS — D649 Anemia, unspecified: Secondary | ICD-10-CM

## 2019-05-13 DIAGNOSIS — Z5181 Encounter for therapeutic drug level monitoring: Secondary | ICD-10-CM | POA: Diagnosis not present

## 2019-05-13 LAB — BASIC METABOLIC PANEL
BUN: 7 mg/dL (ref 6–23)
CO2: 28 mEq/L (ref 19–32)
Calcium: 9.4 mg/dL (ref 8.4–10.5)
Chloride: 102 mEq/L (ref 96–112)
Creatinine, Ser: 0.73 mg/dL (ref 0.40–1.20)
GFR: 103.65 mL/min (ref >60.00)
Glucose, Bld: 88 mg/dL (ref 70–99)
Potassium: 3.6 mEq/L (ref 3.5–5.1)
Sodium: 138 mEq/L (ref 135–145)

## 2019-05-13 LAB — CBC WITH DIFFERENTIAL/PLATELET
Basophils Absolute: 0 10*3/uL (ref 0.0–0.1)
Basophils Relative: 0.4 % (ref 0.0–3.0)
Eosinophils Absolute: 0.1 10*3/uL (ref 0.0–0.7)
Eosinophils Relative: 1.8 % (ref 0.0–5.0)
HCT: 32.3 % — ABNORMAL LOW (ref 36.0–46.0)
Hemoglobin: 10.5 g/dL — ABNORMAL LOW (ref 12.0–15.0)
Lymphocytes Relative: 31.5 % (ref 12.0–46.0)
Lymphs Abs: 1.3 10*3/uL (ref 0.7–4.0)
MCHC: 32.5 g/dL (ref 30.0–36.0)
MCV: 91.8 fl (ref 78.0–100.0)
Monocytes Absolute: 0.3 10*3/uL (ref 0.1–1.0)
Monocytes Relative: 6.1 % (ref 3.0–12.0)
Neutro Abs: 2.6 10*3/uL (ref 1.4–7.7)
Neutrophils Relative %: 60.2 % (ref 43.0–77.0)
Platelets: 283 10*3/uL (ref 150.0–400.0)
RBC: 3.52 Mil/uL — ABNORMAL LOW (ref 3.87–5.11)
RDW: 14.7 % (ref 11.5–15.5)
WBC: 4.3 10*3/uL (ref 4.0–10.5)

## 2019-05-13 LAB — HEPATIC FUNCTION PANEL
ALT: 36 U/L — ABNORMAL HIGH (ref 0–35)
AST: 36 U/L (ref 0–37)
Albumin: 4 g/dL (ref 3.5–5.2)
Alkaline Phosphatase: 114 U/L (ref 39–117)
Bilirubin, Direct: 0.1 mg/dL (ref 0.0–0.3)
Total Bilirubin: 0.4 mg/dL (ref 0.2–1.2)
Total Protein: 7.6 g/dL (ref 6.0–8.3)

## 2019-05-13 LAB — IBC PANEL
Iron: 28 ug/dL — ABNORMAL LOW (ref 42–145)
Saturation Ratios: 10.8 % — ABNORMAL LOW (ref 20.0–50.0)
Transferrin: 185 mg/dL — ABNORMAL LOW (ref 212.0–360.0)

## 2019-05-13 LAB — MAGNESIUM: Magnesium: 2 mg/dL (ref 1.5–2.5)

## 2019-05-13 LAB — PHOSPHORUS: Phosphorus: 3 mg/dL (ref 2.3–4.6)

## 2019-05-13 NOTE — Progress Notes (Signed)
PCP [name],[name], PA-C Referred by Dr Shannon [name]  HPI   IOV 08/23/2014 [name]    Pt referred by Dr. Estanislado [name] for ILD. Pt c/o SOB with activity and rest, dry cough and chest tightness also with and without activity.    48 year old female referred for evaluation of autoimmune interstitial lung disease. She presents with her husband. In 2010 while living in [place] she reports she was diagnosed with lupus associated with rheumatoid arthritis in her joints. In 2012 she moved to live in [facility] and several months after that started noticing insidious onset of shortness of breath. Local rheumatologist diagnosed her with interstitial lung disease. She was referred to Dr. Earnest [name] at [facility] in [facility] and was started on CellCept/prednisone for autoimmune interstitial lung disease he and however, sometime around 2 years ago she ran out of medical insurance and stopped taking these medications. During this time her dyspnea has progressed. It is currently rated as moderate to severe. It is present on exertion and relieved by rest. Even minimal amount of exertion makes her very ddyspneic. Now she has insurance and she did see Dr. Rosann [name] locally and she has autoimmune panel lab ordered. In addition exam revealed crackles and there for she's been referred here for reevaluation of interstitial lung disease and dyspnea. Dyspnea is associated with some chest tightness but no chest pain. This no associated dizzine   11/24/2014 Follow UP ILD  [name]  She has autoimmune ILD with RA/Lupus  She was seen 6 weeks ago, restarted on Cellcept.  Previously on cellcept but lost her insurance until recently.  On low dose prednisone 5mg  daily .  Last CT chest 4/4 showed ILD changes similar to 2013. Echo was ok with EFG 55-60%, nml PAP . In  March .   Did not take dapsone ,due to  GP6D deficiency.  Labs ok last week with nml LFT , no sign change in hbg. /wbc.  She is feeling better. Does feel her breathing is some better.  No flare of cough or wheezing.     OV 01/16/2015  Chief Complaint  Patient presents with  . Follow-up    Pt c/o DOE, mild dry cough, and chest tightness when SOB. Pt states the chest tightness has improved).    follow-up interstitial lung disease in the setting of rheumatoid arthritis Follow-up high risk medication use - on CellCept and prednisone since mid April 2016  - Presents with her husband. Both give a history. Overall she is doing better in terms of dyspnea after starting CellCept and prednisone. However the improvement this only moderate. She still left with a residual moderate dyspnea on exertion that is also made worse with bending or heart air and improved with rest and cool air. It is associated with some cough and wheezing. She takes albuterol inhaler which she feels helps only somewhat but not fully and not quickly enough. She is frustrated by this. In addition she's complaining of some associated right lower back paraspinal spasm for which massage gives her relief. He has never attended pulmonary rehabilitation.  - Lab review shows she has had problems with potassium and has had potassium supplementation. Last lab check was 12/08/2014. She is due for lab test right now   OV 03/28/2015   Chief Complaint  Patient presents with  .  Follow-up    3 month follow up. Pt states that she is still having some problems with her breathing. Pt c/o of feeling chest tightness, chest pain and cough that is dry. Pt denies wheeze. Pt states that she did trial the Advair and does feel that it helped some.   Follow-up interstitial lung disease secondary to autoimmune process and associated dyspnea that seems out of proportion\  She presents with her husband. She continues on CellCept and prednisone. In the  last visit approximately 2-3 months ago she had out of proportion dyspnea. I gave her some Advair to try. She says this only helped a little bit. Overall she says that dyspnea still persists. It is worse than in the spring 2016 when she started CellCept and prednisone. It is stable since July 2016. It is moderate in intensity. Occurs randomly at rest but also with exertion. Occasionally relieved by rest but also happens at rest. Heat and humidity make it worse. Advair helped a little bit only. This no associated chest tightness or wheezing. She did try and enroll  in pulmonary rehabilitation but could not afford the the startup program and is waiting to hear from them for the maintenance program. She is frustrated by all the symptoms.  Pulmonary function test October 15,016 today FVC 2.55 L/64%, total lung capacity 4 L/65% and diffusion 14.5/42%. Overall no change since April 2016   OV 07/04/2015  Chief Complaint  Patient presents with  . Follow-up    pt. states breathing is baseline. SOB. dry cough. wheezing. occ. chest pain/tightness. feels her airway is blocked.   Follow-up interstitial lung disease admitted autoimmune processes and associated out of proportion dyspnea  She presents again with her husband. She continues on CellCept 2 g twice daily associated with prednisone. She cannot take Bactrim or dapsone due to G6PD deficiency. Last seen in the fall of 2016. She was having out of proportion dyspnea. Rated cardia pulmonary stress test and she could not tolerate this test. She then underwent right heart catheterization mid-November 2016 with Shannon [name]. Review the tests this is normal. Overall stable but she and husband still continued to complain about this resting dyspnea associated with wheezing. They hear noises in the chest. Sometime she gasps for air even at rest. She feels it comes from the chest but the husband points to the throat. I offered second opinion at [facility]  because of this unusual symptoms but they declined citing distance. She was supposed to attend pulmonary rehabilitation but they cannot afford a $60 co-pay twice a week 8 weeks. Offered ENT evaluation that agreeable but they wanted done in [place] which is closer logistically. In addition patient is contemplating now applying for disability. 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  Walking desaturation test 185 feet 3 laps and rheumatic: No desaturation    OV 10/12/2015  Chief Complaint  Patient presents with  . Follow-up    PFT today.  breathing is better.  Pt is currently on STD, [employer] approved STD until today, wants to know today if pt is able to return to work, any restrictions.  Pt states that she has been more stable while out of work.      Follow-up interstitial lung disease due to autoimmune processes not otherwise specified  Last seen January 2017. Since then she has been on short-term disability and out of work. She does heavy manual work. The lack of work estimated dyspnea better. She did pulmonary function test today  that I personally visualized and overall it is same. There are no new issues. She is on CellCept and she is tolerating this fine. Last visit she had some anemia we follow this up and the anemia was better. Also hypokalemia resolved by February 2017. She is due for blood work today. 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 This showed up at last visit. She saw an ENT in [place] within referred her to [facility]. She does not want to go to [facility]. She ended up seeing Dr. Ezzard Standing locally. But now she is going to see Dr.  Dina Rich [facility]   Pulmonary function test today 10/12/2015 shows postbronchodilator FVC 2.65 L/67%. FEV1 2.34 L/72% which is up 17% positive bronchodilator response. Ratio of 80/106%. Total lung capacity of 3.8/62%. DLCO  18.36/52%. Overall consistent with restriction and lung parenchymal disease. Overall PFTs are stable compared to April 2016 but perhaps in DLCO slightly better   OV 04/15/2016  Chief Complaint  Patient presents with  . Interstitial Lung Disease    Breathing is unchanged since last OV. Reports SOB, coughing. Cough is non productive. Denies chest tightness or wheezing.   Follow-up interstitial lung disease due to autoimmune processes not otherwise specified. On CellCept and prednisone. Not on Bactrim prophylaxis due to G6PD deficiency  Last visit April 2017. At that time based on pulmonary function tests showed stability and ILD for a year. 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 She did see local ENT doctor [place] and apparently has been reassured. At this point in time she is applying for long-term disability at work but the work discharged from services and she is now applying for Social Security disability. Her dyspnea stable since the interim. She's also had hysterectomy but for the last few weeks her cough is worse and it is dry. This no fever. Is no weight loss or chills. She says she's compliant with her CellCept and prednisone.  OV 05/29/2016  Chief Complaint  Patient presents with  . Follow-up    Pt states she still has harsh dry cough, pt states her SOB is unchanged and chest tightness when SOB or when coughing a lot. Pt deneis f/c/s.     Follow-up interstitial lung disease due to autoimmune processes not otherwise specified. On CellCept and prednisone since April 2016. Not on Bactrim prophylaxis due to G6PD deficiency  Sundai returns for follow-up. She had high resolution CT chest that shows stability and interstitial lung disease since April 2016. She Pulmicort function test that shows mild improvement since April 2016. Therefore it appears that her CellCept and prednisone is helping her she'll lung disease related to collagen-vascular disease.  However she tells me that she continues to have chronic cough for the last few to several months. It is progressive. It is dry. It is worse than her baseline. The dyspnea is unchanged. This no fever or weight loss or chills   OV 07/24/2016  Chief Complaint  Patient presents with  . Follow-up    Pt states she feels the Arnuity has been helping her breathing. Pt c/o dry cough and occ chest tightness.   rec    ICD-9-CM ICD-10-CM   1. ILD (interstitial lung disease) (HCC) 515 J84.9   2. Chronic cough 786.2 R05   3. High risk medication use V58.69 Z79.899    Clinically improved with cellcept/prednisone and arnuity  Blood work ok dec 2017  pLAN - start pulm rehab at [facility] - continue  aruity daily - continue cellcept and prednisone  Followup - 6 months do Pre-bd spiro and dlco only. No lung volume or bd response. No post-bd spiro - 6 months fu Dr Marchelle Gearing after above or sooner if needed     12/15/2016  f/u ov/Wert re: cough dry on arnuity  Chief Complaint  Patient presents with  . Follow-up    Breathing is unchanged since her last visit. Pt states she is here to f/u on recent ABG result. She c/o feeling tired all of the time. She has occ cough- non prod.       Finished at Rehab   beginning  Of May  2018 and trouble walking fast or uphill Waking up with ha's since rehab sev days a week  Cough is new x one month mostly dry and day > noct   No obvious day to day or daytime variability or assoc excess/ purulent sputum or mucus plugs or hemoptysis or cp or chest tightness, subjective wheeze or overt sinus or hb symptoms. No unusual exp hx or h/o childhood pna/ asthma or knowledge of premature birth.   OV 02/13/2017  Chief Complaint  Patient presents with  . Follow-up    cough/ILD follow up - prod cough with brownish/green mucus with tightness and chest pain x2 days.  denies any f/c/s, hemoptysis.  PFT done today.    Follow-up interstitial lung disease due to autoimmune  processes not otherwise specified. On CellCept and prednisone since April 2016. Not on Bactrim prophylaxis due to G6PD deficiency  48 year old female immunosuppressed with CellCept and prednisone. Not seen her in many months. Currently taking prednisone 3 mg per day and CellCept 1000 mg twice daily. She cannot do Bactrim prophylaxis because of G6PD deficiency. She tells me that overall her health has been stable but in the last few weeks noticing increased shortness of breath in the last few days this increased cough and change in color of sputum to green and increased chest tightness and a feeling that she is getting acute bronchitis. She also was contemplated going to the emergency room a few days ago but now she is better. There is no obvious fever or chills or hemoptysis or edema paroxysmal nocturnal dyspnea or orthopnea. Pulmonary function test today shows 10% decline in FVC and DLCO compared to baseline.   OV 06/08/2017  Chief Complaint  Patient presents with  . Follow-up    Feeling about the same as last visit. Still having chest tightness and wheezing at times, Sounds hoarse.     Follow-up interstitial lung disease due to autoimmune processes not otherwise specified. On CellCept and prednisone since April 2016. Not on Bactrim prophylaxis due to G6PD deficiency. Normal Right heart cath Nov 2016.  Last high-resolution CT November 2017  Last visit August 2018.  There is a routine follow-up.  Overall she feels stable.  FVC shows stability since August 2018 but declined since 1 year ago.  DLCO shows continued decline.  Though her lung health is stable.  She says she lost her 1 only biological sister last week due to lupus.  The sister was only 41 and lived in [place].  There are no new issues. Walking desaturation test on 06/08/2017 185 feet x 3 laps on ROOM AIR:  did not desaturate. Rest pulse ox was 100%%, final pulse ox was 100%. HR response 81/min at rest to 109/min at peak exertion.  Patient  Shannon [name]  Did not Desaturate < 88% . Shannon [name] did nto  Desaturated </= 3% points. Shannon [name] yes did get tachyardic   OV 10/15/2017   Follow-up interstitial lung disease due to autoimmune processes not otherwise specified. On CellCept and prednisone since April 2016. Not on Bactrim prophylaxis due to G6PD deficiency. Normal Right heart cath Nov 2016 and feb 2019.  Last high-resolution CT November 2017 and dec 2018 without progresion   Last visit December 2018.  This is a routine follow-up.  In the interim she had high-resolution CT scan of the chest that did not show any progression in interstitial lung disease between 2017 and 2018.Marland Kitchen  Therefore we did an echocardiogram that showed slight elevation in pulmonary artery systolic pressure.  Therefore we sent it to her repeat right heart catheterization and this was normal as documented below.  Overall she feels stable compared to one year ago but has declined compared to 2 years ago.  She is also complaining about a new recurrence of cough that happens mostly at night despite Symbicort and prednisone and CellCept.  It wakes her up.  It is moderate in intensity.  There is no associated wheezing or edema orthopnea.  It happens randomly.  There is associated heartburn.  She is on nothing for acid reflux.       Right Heart Pressures 08/20/17 [facility] Procedural Findings: Hemodynamics (mmHg) RA mean 3 RV 30/6 PA 23/8, mean 14 PCWP mean 8  Oxygen saturations: PA 72% AO 98%  Cardiac Output (Fick) 6.73  Cardiac Index (Fick) 3.06  Cardiac Output (Thermo) 6.94 Cardiac Index (Thermo) 3.15    OV 12/01/2017  Chief Complaint  Patient presents with  . Follow-up    Pt has SOB with exertion, wheezing, some chest tightness. Pt was dry cough more at bedtime.      Follow-up interstitial lung disease due to autoimmune processes not otherwise specified. On CellCept and prednisone since April 2016. Not on Bactrim prophylaxis due to  G6PD deficiency. Normal Right heart cath Nov 2016 and feb 2019.  Last high-resolution CT November 2017 and dec 2018 without progresion   This visit is to see if she is got progressive interstitial lung disease.  Last visit she was complaining of more cough.  Therefore we added some acid reflux treatment.  She says after the acid reflux treatment symptoms have actually improved.  This contradicts what she told the CMA at intake.  Overall she is feeling stable.  However she does have a new episode of orthostatic dizziness going on at a mild level for the last 1 week.  Today after doing pulmonary function test she did feel dizzy.  Therefore we checked orthostatics on her and she got tachycardic standing up and her blood pressure did drop although still within normal limits.  She does not have any chest pain.  Lung function test shows continued stability in the last 1 year including compared to the most recent attempt.  OV 03/08/2018  Subjective:  Patient ID: Shannon [name], female , DOB: 07-26-1970 , age 60 y.o. , MRN: [id number] , ADDRESS: [address]  [place] [facility] [address]   03/08/2018 -   Chief Complaint  Patient presents with  . Follow-up    Spirometry performed today. Pt states she is still having some mild problems with dizziness but not as bad as last visit. Pt also states she has been having problems with headaches x1 month. Pt states her breathing is about the same  as last visit and also states she still has the dry cough.    Follow-up interstitial lung disease due to autoimmune processes not otherwise specified. On CellCept and prednisone since April 2016. Not on Bactrim prophylaxis due to G6PD deficiency. Normal Right heart cath Nov 2016 and feb 2019.  Last high-resolution CT November 2017 and dec 2018 without progresion     HPI Shannon [name] 48 y.o. - connective tissue disease ILD. Follow-up. Last seen June 2019 by she started having new onset dizziness. It seemed orthostatic. She  says since then it is spontaneously improved but still persists. She is also dealing with fatigue issues. In 02/26/2018 she felt she had a lupus flare saw Dr. Algis Downs and given a prednisone burst. Autoimmune profile at that time showed continued positive ANA titer but normal complements. She had a CBC that showed a drop in hemoglobin by 1 g percent. Her baseline is around 11.5 g percent and currently it is around 10.5 g percent. She says after the prednisone burst a lupus flare symptoms and fatigue have improved although it still persists. If it is actually worse in the last few months. She had pulmonary function test that shows continued improvement especially following the recent prednisone burst. Walking desaturation test was normal other than tachycardia. We observed her to be walking very slowly in a very deconditioned and fatigued fashion. 02/26/2089 and liver function and renal function reviewed and this was normal. Medication review shows she is on oxycodone and gabapentin for back pain. She says she's been on gabapentin for the last 1 year. It appears that the temporal sequence of fatigue and dizziness might be related to gabapentin but she is not so sure.       Results for TAIJAH, COWINS (MRN [id number]) as of 03/08/2018 10:51  Ref. Range 02/26/2018 10:53  Hemoglobin Latest Ref Range: 11.7 - 15.5 g/dL 02.3 (L)  Results for LILLYIAN, SUTER (MRN [id number]) as of 03/08/2018 10:51  Ref. Range 02/26/2018 10:53  Creatinine Latest Ref Range: 0.50 - 1.10 mg/dL 8.37  Results for NOTASHA, ALERS (MRN [id number]) as of 03/08/2018 10:51  Ref. Range 02/26/2018 10:53  AST Latest Ref Range: 10 - 35 U/L 19  ALT Latest Ref Range: 6 - 29 U/L 18   Results for BRIENNA, BIESECKER (MRN [id number]) as of 03/08/2018 10:51  Ref. Range 02/26/2018 10:53  Anti Nuclear Antibody(ANA) Latest Ref Range: NEGATIVE  POSITIVE (A)  ANA Pattern 1 Unknown SPECKLED (A)  ANA Titer 1 Latest Units: titer > OR = 1:1280 (A)  ds DNA Ab Latest Units:  IU/mL <1  C3 Complement Latest Ref Range: 83 - 193 mg/dL 122  C4 Complement Latest Ref Range: 15 - 57 mg/dL 33    ROS - per HPI    OV 06/03/2018  Subjective:  Patient ID: Shannon [name], female , DOB: [birth year] , age 61 y.o. , MRN: [id number] , ADDRESS: [address]  [place] [facility] [address]   06/03/2018 -   Chief Complaint  Patient presents with  . Follow-up    pt states breathing is baseline. c/o sob with exertion, non prod cough & wheezing    Follow-up interstitial lung disease due to autoimmune processes not otherwise specified. On CellCept and prednisone since April 2016. Not on Bactrim prophylaxis due to G6PD deficiency. Normal Right heart cath Nov 2016 and feb 2019.  Last high-resolution CT November 2017 and dec 2018 without progresion   HPI Shannon [name] 48 y.o. -presents for follow-up.  Last visit September 2019.  At that time CBC showed anemia.  We repeated the hemoglobin 1 month later and was stable around 10 g%.  She tells me that overall she is stable.  Last visit she had dizziness and I told her to stop gabapentin which she did and her dizziness and fatigue have improved.  She feels she is stable but in the last 3 days she has had a dry cough but no fever or congestion or wheezing or hemoptysis or edema.  No worsening shortness of breath or chest tightness.  Other than that she feels good.  She did up walking desaturation test today and it appears similar to previous visit.       OV 05/13/2019  Subjective:  Patient ID: Shannon [name], female , DOB: 20-Sep-1970 , age 17 y.o. , MRN: [id number] , ADDRESS: [address] [place] [address]   05/13/2019 -   Chief Complaint  Patient presents with  . Follow-up    Pt just had total left knee replacement mid October 2020. Pt said she still has problems with SOB especially with going upstairs. Pt also has occ wheezing but denies any real complaints of cough.   Follow-up interstitial lung disease due to autoimmune  processes not otherwise specified. On CellCept and prednisone since April 2016. Not on Bactrim prophylaxis due to G6PD deficiency. Normal Right heart cath Nov 2016 and feb 2019.  Last high-resolution CT November 2017 and dec 2018 without progression. Last PFT Sept 2019  HPI Shannon [name] 48 y.o. -returns for follow-up.  Last seen in September 2019 and because of the pandemic things got delayed.  She was supposed to see me in 6 months with spirometry and high-resolution CT chest for progression but this did not happen.  At this follow-up we do not have those data points as yet.  She tells me the interim she has had knee surgery and therefore she is not able to do a simple walk test with [place].  She has gained some weight.  Also in the interim she is now on Social Security disability and she is unemployed now.  She feels her shortness of breath is slightly worse particularly when walking up stairs but otherwise overall she feels stable.  I reviewed her blood work.  And it seems anemia is worse in the 9 g%.  She really denies any bleeding.  Normally she runs between 10 and 11 g%.  I noticed that she still not had a shingles vaccine.     SYMPTOM SCALE - ILD 05/13/2019   O2 use RA  Shortness of Breath 0 -> 5 scale with 5 being worst (score 6 If unable to do)  At rest 2  Simple tasks - showers, clothes change, eating, shaving 3  Household (dishes, doing bed, laundry) 3  Shopping 3  Walking level at own pace 2  Walking keeping up with others of same age 41  Walking up Stairs 5  Walking up Hill 5  Total (40 - 48) Dyspnea Score 29  How bad is your cough? 0  How bad is your fatigue 3       Results for Shannon [name], Shannon [name] (MRN [id number]) as of 02/13/2017 12:16  Ref. Range 10/09/2014 09:39 03/28/2015 11:40 10/12/2015 13:42 05/06/2016 10:38 02/13/2017 10:49 06/08/2017  12/01/2017  03/08/2018   FVC-Pre Latest Units: L 2.54 2.55 2.53 2.63 2.32 2.31 2.38 2.46  FVC-%Pred-Pre Latest Units: % 64 64 64 67 59 59% 61%  67%  Results for Shannon [name], Shannon [name] (MRN [id number]) as of 02/13/2017 12:16  Ref. Range 10/09/2014 09:39 03/28/2015 11:40 10/12/2015 13:42 05/06/2016 10:38 02/13/2017 10:49 06/08/2017  12/01/2017  03/08/2018   DLCO unc Latest Units: ml/min/mmHg 15.03 14.50 18.36 18.31 16.35 14.21 16.38 x  DLCO unc % pred Latest Units: % 43 42 53 53 47 41 47% x       Simple office walk 185 feet x  3 laps goal with forehead probe 03/08/2018  06/03/2018   O2 used Room air Room iar  Number laps completed 3 3 x 250 feet  Comments about pace Slow pace normal  Resting Pulse Ox/HR 100% and 96/min 100% and 76  Final Pulse Ox/HR 100% and 112/min 96% and 121  Desaturated </= 88% n no  Desaturated <= 3% points no Yes, 4 points  Got Tachycardic >/= 90/min yes yes  Symptoms at end of test Mild fatigue Mild dyspnea  Miscellaneous comments Very slow pace    Results for Shannon [name], Shannon [name] (MRN [id number]) as of 05/13/2019 11:57  Ref. Range 04/06/2019 02:56  Hemoglobin Latest Ref Range: 12.0 - 15.0 g/dL 9.1 (L)  Results for Shannon [name], Shannon [name] (MRN [id number]) as of 05/13/2019 11:57  Ref. Range 04/06/2019 02:56  Creatinine Latest Ref Range: 0.44 - 1.00 mg/dL 0.86  Results for Shannon [name], Shannon [name] (MRN [id number]) as of 05/13/2019 11:57  Ref. Range 12/01/2017 15:45 02/26/2018 10:53 04/05/2018 11:27 06/03/2018 14:25 06/14/2018 11:28 06/15/2018 12:12 07/14/2018 10:24 03/31/2019 10:47 03/31/2019 10:49 04/05/2019 06:21 04/06/2019 02:56  Hemoglobin Latest Ref Range: 12.0 - 15.0 g/dL 52.8 (L) 41.3 (L) 24.4 (L) 11.3 (L)     10.0 (L) 10.3 (L) 9.1 (L)   ROS - per HPI     has a past medical history of Anemia, Anxiety, G6PD deficiency, ILD (interstitial lung disease) (HCC), Lupus (HCC), and Rheumatoid arthritis (HCC).   reports that she quit smoking about 4 years ago. Her smoking use included cigarettes. She has a 7.00 pack-year smoking history. She has never used smokeless tobacco.  Past Surgical History:  Procedure Laterality Date  . ABDOMINAL  HYSTERECTOMY    . APPENDECTOMY  1980  . cardiac catherization  08/20/2017  . CARDIAC CATHETERIZATION N/A 05/10/2015   Procedure: Right Heart Cath;  Surgeon: Laurey Morale, MD;  Location: [facility] INVASIVE CV LAB;  Service: Cardiovascular;  Laterality: N/A;  . CESAREAN SECTION  '95, '02, '07   X 3  . CHOLECYSTECTOMY  2010  . LAPAROSCOPIC HYSTERECTOMY  10/2015   have ovaries  . RIGHT HEART CATH N/A 08/20/2017   Procedure: RIGHT HEART CATH;  Surgeon: Laurey Morale, MD;  Location: [facility] INVASIVE CV LAB;  Service: Cardiovascular;  Laterality: N/A;  . TOTAL KNEE ARTHROPLASTY Left 04/04/2019   Procedure: TOTAL KNEE ARTHROPLASTY;  Surgeon: Ollen Gross, MD;  Location: [facility] ORS;  Service: Orthopedics;  Laterality: Left;   . TUBAL LIGATION      Allergies  Allergen Reactions  . Asa [Aspirin] Hives and Shortness Of Breath    Chest tightness   . Penicillins Hives    Has patient had a PCN reaction causing immediate rash, facial/tongue/throat swelling, SOB or lightheadedness with hypotension: Unknown Has patient had a PCN reaction causing severe rash involving mucus membranes or skin necrosis: Unknown Has patient had a PCN reaction that required hospitalization: Unknown Has patient had a PCN reaction occurring within the last 10 years: No If all of the above answers are "NO", then may proceed with Cephalosporin use.   . Sulfa Antibiotics  G6PD deficiency     Immunization History  Administered Date(s) Administered  . Influenza Split 03/23/2014  . Influenza,inj,Quad PF,6+ Mos 03/15/2016, 06/08/2017, 03/08/2018, 02/17/2019  . Pneumococcal-Unspecified 08/23/2014    Family History  Problem Relation Age of Onset  . Sarcoidosis Mother   . Lupus Sister   . Healthy Daughter   . Healthy Son   . Healthy Son      Current Outpatient Medications:  .  albuterol (VENTOLIN HFA) 108 (90 Base) MCG/ACT inhaler, Inhale 2 puffs into the lungs every 6 (six) hours as needed for wheezing or shortness of  breath., Disp: 1 Inhaler, Rfl: 5 .  buPROPion (WELLBUTRIN XL) 150 MG 24 hr tablet, Take 150 mg by mouth daily. , Disp: , Rfl: 1 .  busPIRone (BUSPAR) 10 MG tablet, Take 10 mg by mouth daily., Disp: , Rfl:  .  famotidine (PEPCID) 20 MG tablet, Take 1 tablet (20 mg total) by mouth daily., Disp: 90 tablet, Rfl: 0 .  hydroxychloroquine (PLAQUENIL) 200 MG tablet, Take 400 mg by mouth daily at 12 noon., Disp: , Rfl:  .  magnesium oxide (MAG-OX) 400 MG tablet, Take 1 tablet (400 mg total) by mouth daily., Disp: 30 tablet, Rfl: 0 .  mycophenolate (CELLCEPT) 500 MG tablet, Take 2 tablets by mouth twice daily (Patient taking differently: Take 1,000 mg by mouth 2 (two) times daily. ), Disp: 360 tablet, Rfl: 0 .  oxyCODONE (OXY IR/ROXICODONE) 5 MG immediate release tablet, Take 1-2 tablets (5-10 mg total) by mouth every 6 (six) hours as needed for moderate pain (pain score 4-6)., Disp: 56 tablet, Rfl: 0 .  QUEtiapine (SEROQUEL) 100 MG tablet, Take 150 mg by mouth at bedtime., Disp: , Rfl:  .  ranitidine (ZANTAC) 300 MG tablet, Take 1 tablet (300 mg total) by mouth at bedtime., Disp: 30 tablet, Rfl: 5 .  sertraline (ZOLOFT) 100 MG tablet, Take 100 mg by mouth at bedtime. , Disp: , Rfl: 0 .  amLODipine (NORVASC) 5 MG tablet, Take 1 tablet (5 mg total) by mouth daily. (Patient not taking: Reported on 03/29/2019), Disp: 30 tablet, Rfl: 3      Objective:   Vitals:   05/13/19 1138  BP: 134/90  Pulse: 97  SpO2: 100%  Weight: 275 lb 3.2 oz (124.8 kg)  Height: 5\' 11"  (1.803 m)    Estimated body mass index is 38.38 kg/m as calculated from the following:   Height as of this encounter: 5\' 11"  (1.803 m).   Weight as of this encounter: 275 lb 3.2 oz (124.8 kg).  @WEIGHTCHANGE @  [employer]   05/13/19 1138  Weight: 275 lb 3.2 oz (124.8 kg)     Physical Exam  General Appearance:    Alert, cooperative, no distress, appears stated age - yes , Deconditioned looking - no , OBESE  - yes, Sitting on  Wheelchair -  no  Head:    Normocephalic, without obvious abnormality, atraumatic  Eyes:    PERRL, conjunctiva/corneas clear,  Ears:    Normal TM's and external ear canals, both ears  Nose:   Nares normal, septum midline, mucosa normal, no drainage    or sinus tenderness. OXYGEN ON  - no . Patient is @ ra   Throat:   Lips, mucosa, and tongue normal; teeth and gums normal. Cyanosis on lips - no  Neck:   Supple, symmetrical, trachea midline, no adenopathy;    thyroid:  no enlargement/tenderness/nodules; no carotid   bruit or JVD  Back:  Symmetric, no curvature, ROM normal, no CVA tenderness  Lungs:     Distress - no , Wheeze no, Barrell Chest - no, Purse lip breathing - no, Crackles - yes at base   Chest Wall:    No tenderness or deformity.    Heart:    Regular rate and rhythm, S1 and S2 normal, no rub   or gallop, Murmur - no  Breast Exam:    NOT DONE  Abdomen:     Soft, non-tender, bowel sounds active all four quadrants,    no masses, no organomegaly. Visceral obesity - no  Genitalia:   NOT DONE  Rectal:   NOT DONE  Extremities:   Extremities - normal, Has Cane - no, Clubbing - no, Edema - no  Pulses:   2+ and symmetric all extremities  Skin:   Stigmata of Connective Tissue Disease - no  Lymph nodes:   Cervical, supraclavicular, and axillary nodes normal  Psychiatric:  Neurologic:   Pleasant - yes, Anxious - no, Flat affect - no  CAm-ICU - neg, Alert and Oriented x 3 - yes, Moves all 4s - yes, Speech - normal, Cognition - intact           Assessment:       ICD-10-CM   1. Interstitial lung disease due to connective tissue disease (HCC)  J84.89    M35.9   2. Therapeutic drug monitoring  Z51.81   3. High risk medication use  Z79.899   4. Anemia, unspecified type  D64.9        Plan:     Patient Instructions     ICD-10-CM   1. Interstitial lung disease due to connective tissue disease (HCC)  J84.89    M35.9   2. Therapeutic drug monitoring  Z51.81   3. High risk  medication use  Z79.899   4. Anemia, unspecified type  D64.9     Clinically stable but anemia is worse than usual in Oct 2020  Plan Check CBC, bmet, lft, mag, phos,   Check Iron Panel Continue cellcept and prednisone Do HRCT in next 1-3 months  Please talk to PCP [name], [name], PA-C -  And discuss gettingt  shingrix (GSK) inactivated vaccine against shingles  Followup - will call with blood test result - return to see Dr Marchelle Gearingamaswamy in ILD clinic after HRCT next 1-3 months      SIGNATURE    Dr. Kalman ShanMurali Jahmai Finelli, M.D., F.C.C.P,  Pulmonary and Critical Care Medicine Staff Physician, [facility] Center Director - Interstitial Lung Disease  Program  Pulmonary Fibrosis [facility] [place], [place], [id number]  Pager: [phone], If no answer or between  15:00h - 7:00h: call [phone] Telephone: [phone]  12:20 PM 05/13/2019

## 2019-05-13 NOTE — Patient Instructions (Addendum)
ICD-10-CM   1. Interstitial lung disease due to connective tissue disease ([facility])  J84.89    M35.9   2. Therapeutic drug monitoring  Z51.81   3. High risk medication use  Z79.899   4. Anemia, unspecified type  D64.9     Clinically stable but anemia is worse than usual in Oct 2020  Plan Check CBC, bmet, lft, mag, phos,   Check Iron Panel Continue cellcept and prednisone Do HRCT in next 1-3 months  Please talk to PCP [name], [name], PA-C -  And discuss gettingt  shingrix ([place]) inactivated vaccine against shingles  Followup - will call with blood test result - return to see Dr Chase Caller in ILD clinic after HRCT next 1-3 months

## 2019-05-17 DIAGNOSIS — M25562 Pain in left knee: Secondary | ICD-10-CM | POA: Diagnosis not present

## 2019-05-24 DIAGNOSIS — M25562 Pain in left knee: Secondary | ICD-10-CM | POA: Diagnosis not present

## 2019-05-26 DIAGNOSIS — M25562 Pain in left knee: Secondary | ICD-10-CM | POA: Diagnosis not present

## 2019-05-26 DIAGNOSIS — M25662 Stiffness of left knee, not elsewhere classified: Secondary | ICD-10-CM | POA: Diagnosis not present

## 2019-06-01 DIAGNOSIS — Z1389 Encounter for screening for other disorder: Secondary | ICD-10-CM | POA: Diagnosis not present

## 2019-06-01 DIAGNOSIS — G894 Chronic pain syndrome: Secondary | ICD-10-CM | POA: Diagnosis not present

## 2019-06-01 DIAGNOSIS — M329 Systemic lupus erythematosus, unspecified: Secondary | ICD-10-CM | POA: Diagnosis not present

## 2019-06-01 DIAGNOSIS — M545 Low back pain: Secondary | ICD-10-CM | POA: Diagnosis not present

## 2019-06-04 ENCOUNTER — Other Ambulatory Visit: Payer: Self-pay | Admitting: Internal Medicine

## 2019-06-06 DIAGNOSIS — M25562 Pain in left knee: Secondary | ICD-10-CM | POA: Diagnosis not present

## 2019-06-08 DIAGNOSIS — M25562 Pain in left knee: Secondary | ICD-10-CM | POA: Diagnosis not present

## 2019-06-13 ENCOUNTER — Other Ambulatory Visit: Payer: Medicare HMO

## 2019-06-14 DIAGNOSIS — M25562 Pain in left knee: Secondary | ICD-10-CM | POA: Diagnosis not present

## 2019-06-15 ENCOUNTER — Ambulatory Visit
Admission: RE | Admit: 2019-06-15 | Discharge: 2019-06-15 | Disposition: A | Discharge status: Home / Self Care | Payer: Medicare HMO | Source: Ambulatory Visit | Attending: Internal Medicine | Admitting: Internal Medicine

## 2019-06-15 ENCOUNTER — Other Ambulatory Visit: Payer: Medicare HMO

## 2019-06-15 DIAGNOSIS — R69 Illness, unspecified: Secondary | ICD-10-CM | POA: Diagnosis not present

## 2019-06-15 DIAGNOSIS — F411 Generalized anxiety disorder: Secondary | ICD-10-CM | POA: Diagnosis not present

## 2019-06-15 DIAGNOSIS — J849 Interstitial pulmonary disease, unspecified: Secondary | ICD-10-CM

## 2019-06-15 DIAGNOSIS — R0602 Shortness of breath: Secondary | ICD-10-CM | POA: Diagnosis not present

## 2019-06-16 DIAGNOSIS — M25562 Pain in left knee: Secondary | ICD-10-CM | POA: Diagnosis not present

## 2019-06-27 DIAGNOSIS — M25562 Pain in left knee: Secondary | ICD-10-CM | POA: Diagnosis not present

## 2019-07-04 DIAGNOSIS — Z1389 Encounter for screening for other disorder: Secondary | ICD-10-CM | POA: Diagnosis not present

## 2019-07-04 DIAGNOSIS — M545 Low back pain: Secondary | ICD-10-CM | POA: Diagnosis not present

## 2019-07-04 DIAGNOSIS — G894 Chronic pain syndrome: Secondary | ICD-10-CM | POA: Diagnosis not present

## 2019-07-04 DIAGNOSIS — M329 Systemic lupus erythematosus, unspecified: Secondary | ICD-10-CM | POA: Diagnosis not present

## 2019-08-01 DIAGNOSIS — Z96652 Presence of left artificial knee joint: Secondary | ICD-10-CM | POA: Diagnosis not present

## 2019-08-01 DIAGNOSIS — Z1389 Encounter for screening for other disorder: Secondary | ICD-10-CM | POA: Diagnosis not present

## 2019-08-01 DIAGNOSIS — M545 Low back pain: Secondary | ICD-10-CM | POA: Diagnosis not present

## 2019-08-01 DIAGNOSIS — G894 Chronic pain syndrome: Secondary | ICD-10-CM | POA: Diagnosis not present

## 2019-08-01 DIAGNOSIS — M329 Systemic lupus erythematosus, unspecified: Secondary | ICD-10-CM | POA: Diagnosis not present

## 2019-08-01 DIAGNOSIS — M25561 Pain in right knee: Secondary | ICD-10-CM | POA: Diagnosis not present

## 2019-08-06 ENCOUNTER — Telehealth: Payer: Self-pay | Admitting: Internal Medicine

## 2019-08-06 NOTE — Telephone Encounter (Signed)
Reviewed Shannon [name] high-resolution CT chest from around Christmas 2020  Plan -Let her know sorry for the delay but the ILD is stable 0 given her appointment in April 2021 for spirometry and DLCO -if possible -Give appointment to see me face-to-face  -ILD clinic or any day but 15 minutes slot is enough  -On the day she, she will have a simple walk test and symptom score and therapeutic lab work

## 2019-08-08 DIAGNOSIS — N390 Urinary tract infection, site not specified: Secondary | ICD-10-CM | POA: Diagnosis not present

## 2019-08-08 DIAGNOSIS — M545 Low back pain: Secondary | ICD-10-CM | POA: Diagnosis not present

## 2019-08-08 NOTE — Telephone Encounter (Signed)
Spoke with pt. She is aware of her results. PFT has been scheduled on 10/18/19 at 1100 and OV with MR after at 1200. COVID test has been set up for 10/15/19 at 1100.

## 2019-08-10 DIAGNOSIS — F411 Generalized anxiety disorder: Secondary | ICD-10-CM | POA: Diagnosis not present

## 2019-08-10 DIAGNOSIS — R69 Illness, unspecified: Secondary | ICD-10-CM | POA: Diagnosis not present

## 2019-08-17 DIAGNOSIS — M3213 Lung involvement in systemic lupus erythematosus: Secondary | ICD-10-CM | POA: Diagnosis not present

## 2019-08-17 DIAGNOSIS — G8929 Other chronic pain: Secondary | ICD-10-CM | POA: Diagnosis not present

## 2019-08-17 DIAGNOSIS — J8489 Other specified interstitial pulmonary diseases: Secondary | ICD-10-CM | POA: Diagnosis not present

## 2019-08-17 DIAGNOSIS — M545 Low back pain: Secondary | ICD-10-CM | POA: Diagnosis not present

## 2019-08-17 DIAGNOSIS — R531 Weakness: Secondary | ICD-10-CM | POA: Diagnosis not present

## 2019-08-17 DIAGNOSIS — R5383 Other fatigue: Secondary | ICD-10-CM | POA: Diagnosis not present

## 2019-08-17 DIAGNOSIS — Z79891 Long term (current) use of opiate analgesic: Secondary | ICD-10-CM | POA: Diagnosis not present

## 2019-08-17 DIAGNOSIS — Z7952 Long term (current) use of systemic steroids: Secondary | ICD-10-CM | POA: Diagnosis not present

## 2019-08-17 DIAGNOSIS — G4700 Insomnia, unspecified: Secondary | ICD-10-CM | POA: Diagnosis not present

## 2019-08-17 DIAGNOSIS — N3000 Acute cystitis without hematuria: Secondary | ICD-10-CM | POA: Diagnosis not present

## 2019-08-27 DIAGNOSIS — J329 Chronic sinusitis, unspecified: Secondary | ICD-10-CM | POA: Diagnosis not present

## 2019-08-31 ENCOUNTER — Encounter: Payer: Self-pay | Admitting: Physical Therapy

## 2019-08-31 ENCOUNTER — Other Ambulatory Visit: Payer: Self-pay

## 2019-08-31 ENCOUNTER — Ambulatory Visit: Payer: Medicare HMO | Attending: Rheumatology | Admitting: Physical Therapy

## 2019-08-31 DIAGNOSIS — M545 Low back pain: Secondary | ICD-10-CM | POA: Insufficient documentation

## 2019-08-31 DIAGNOSIS — G8929 Other chronic pain: Secondary | ICD-10-CM | POA: Insufficient documentation

## 2019-08-31 DIAGNOSIS — R252 Cramp and spasm: Secondary | ICD-10-CM | POA: Insufficient documentation

## 2019-08-31 DIAGNOSIS — M6281 Muscle weakness (generalized): Secondary | ICD-10-CM | POA: Diagnosis present

## 2019-08-31 DIAGNOSIS — R293 Abnormal posture: Secondary | ICD-10-CM | POA: Insufficient documentation

## 2019-08-31 NOTE — Patient Instructions (Signed)
Access Code: [email or web address] URL: [email or web address] Date: 08/31/2019 Prepared by: Solon Palm  Exercises Bridge - 10 reps - 1-3 sets - 1x daily - 7x weekly Supine Lower Trunk Rotation - 5 reps - 1 sets - 10 sec hold - 1x daily - 7x weekly

## 2019-08-31 NOTE — Therapy (Signed)
[facility] [address], [address] [place], [place], [address] Phone: [phone]   Fax:  [phone]  Physical Therapy Evaluation  Patient Details  Name: Shannon [name] MRN: [id number] Date of Birth: 1971/06/07 Referring Provider (PT): Dawayne Cirri MD   Encounter Date: 08/31/2019  PT End of Session - 08/31/19 0931    Visit Number  1    Date for PT Re-Evaluation  10/26/19    Authorization Type  aetna medicare/ medicaid    Progress Note Due on Visit  10    PT Start Time  0931    PT Stop Time  1015    PT Time Calculation (min)  44 min    Activity Tolerance  Patient tolerated treatment well    Behavior During Therapy  [facility] for tasks assessed/performed       Past Medical History:  Diagnosis Date  . Anemia   . Anxiety   . G6PD deficiency   . ILD (interstitial lung disease) (HCC)   . Lupus (HCC)   . Rheumatoid arthritis [facility])     Past Surgical History:  Procedure Laterality Date  . ABDOMINAL HYSTERECTOMY    . APPENDECTOMY  1980  . cardiac catherization  08/20/2017  . CARDIAC CATHETERIZATION N/A 05/10/2015   Procedure: Right Heart Cath;  Surgeon: Laurey Morale, MD;  Location: [facility] INVASIVE CV LAB;  Service: Cardiovascular;  Laterality: N/A;  . CESAREAN SECTION  '95, '02, '07   X 3  . CHOLECYSTECTOMY  2010  . LAPAROSCOPIC HYSTERECTOMY  10/2015   have ovaries  . RIGHT HEART CATH N/A 08/20/2017   Procedure: RIGHT HEART CATH;  Surgeon: Laurey Morale, MD;  Location: [facility] INVASIVE CV LAB;  Service: Cardiovascular;  Laterality: N/A;  . TOTAL KNEE ARTHROPLASTY Left 04/04/2019   Procedure: TOTAL KNEE ARTHROPLASTY;  Surgeon: Ollen Gross, MD;  Location: [facility] ORS;  Service: Orthopedics;  Laterality: Left;   . TUBAL LIGATION      There were no vitals filed for this visit.   Subjective Assessment - 08/31/19 0934    Subjective  Patient experiencing sharp achy pain with bending, stooping or sitting too long. It began about  9-10 months ago. Heat help sometimes. It hurts to lie flat.    Pertinent History  RA, OA, interstitial lung disease, anxiety, anemia, left knee TKA 04/05/19, Right knee to be replaced, depression    Limitations  Sitting    How long can you sit comfortably?  15-20 min depending on chair (recliner best); straight back painful    Diagnostic tests  none    Patient Stated Goals  be able to walk without stiffness, sit longer comfortably    Currently in Pain?  Yes    Pain Score  5     Pain Location  Back    Pain Orientation  Left;Right   on right it's more in the hip   Pain Descriptors / Indicators  Aching;Sharp    Pain Type  Chronic pain    Pain Onset  More than a month ago    Pain Frequency  Intermittent    Aggravating Factors   bending, stooping, prolonged sitting    Pain Relieving Factors  heat, sitting in recliner    Effect of Pain on Daily Activities  makes walking and sitting difficult; hurts to bend         [facility] PT Assessment - 08/31/19 0001      Assessment   Medical Diagnosis  low back pain  Referring Provider (PT)  Anne Shutter MD    Onset Date/Surgical Date  10/22/18    Hand Dominance  Left    Next MD Visit  end of May    Prior Therapy  yes      Precautions   Precautions  Other (comment)    Precaution Comments  Sulfa Allergy - no ionto      Restrictions   Weight Bearing Restrictions  No      Balance Screen   Has the patient fallen in the past 6 months  Yes    How many times?  2   knee gave out; tripped on something   Has the patient had a decrease in activity level because of a fear of falling?   No    Is the patient reluctant to leave their home because of a fear of falling?   No      Home Film/video editor residence    Living Arrangements  Spouse/significant other    Type of [place]    Additional Comments  back painful with stairs      Prior Function   Level of Independence  Independent    Vocation  Works at home    Leisure   be able to exercise more      Observation/Other Assessments   Focus on Therapeutic Outcomes (FOTO)   52% limited      Posture/Postural Control   Posture/Postural Control  Postural limitations    Posture Comments  stands in 10 deg lumbar flexion, WBing through RLE; left knee in flexion; decreased lordotic curve in lumbar spine      ROM / Strength   AROM / PROM / Strength  AROM;Strength      AROM   AROM Assessment Site  Lumbar    Lumbar Flexion  30    Lumbar Extension  5    Lumbar - Right Side Bend  10    Lumbar - Left Side Bend  15    Lumbar - Right Rotation  75%    Lumbar - Left Rotation  25%   pain on left     Strength   Overall Strength Comments  grossly 5/5 in BLE except knee flex 4+/5      Flexibility   Soft Tissue Assessment /Muscle Length  yes    Hamstrings  left tight    Quadriceps  bil tightness    Piriformis  WNL bil    Quadratus Lumborum  left tender     Obturator Internus  bil hip flexor tightness      Palpation   Spinal mobility  pain with PA mobs to L4/5 and L5/S1 central and unilateral    Palpation comment  marked left QL, bil lumbar paraspinals, left gluteals                Objective measurements completed on examination: See above findings.              PT Education - 08/31/19 1102    Education Details  HEP; Patient educated on pool therapy by Leeroy Cha, PTA    Person(s) Educated  Patient    Methods  Explanation;Demonstration;Handout    Comprehension  Verbalized understanding;Returned demonstration       PT Short Term Goals - 08/31/19 1837      PT SHORT TERM GOAL #1   Title  Ind with inital HEP for lumbar and hip flexibility.    Time  2  Period  Weeks    Status  New    Target Date  09/14/19      PT SHORT TERM GOAL #2   Title  Patient able to demonstrate correct body mechanics for bending and lifting to prevent further injury.    Time  4    Period  Weeks    Status  New    Target Date  09/28/19      PT SHORT TERM  GOAL #3   Title  Patient to demo improved standing posture with neutral trunk and equal WB to decrease strain on low back.    Time  4    Period  Weeks    Status  New    Target Date  09/28/19        PT Long Term Goals - 08/31/19 1837      PT LONG TERM GOAL #1   Title  Patient to report decreased pain in the low back with ADLs by 70% or more.    Time  8    Period  Weeks    Status  New    Target Date  10/26/19      PT LONG TERM GOAL #2   Title  Patient able to climb stairs without an increase in back pain.    Time  8    Period  Weeks    Status  New      PT LONG TERM GOAL #3   Title  Patient to report decreased stiffness with waking by 70% or more.    Time  8    Period  Weeks    Status  New      PT LONG TERM GOAL #4   Title  Patient to report increased sitting tolerance to 1 hour.    Time  8    Period  Weeks    Status  New             Plan - 08/31/19 1106    Clinical Impression Statement  Patient presents with compaints of low back pain beginning in May 2020. She has pain mainly with prolonged sitting, bending and stooping and stairs, but also is limited with walking. She also reports 2 falls in the past six months. Patient has multiple co-morbidities including a recent left TKA and upcoming Rt TKA. She has postural deficits and tightness in her hips and knees that likely contributed to her back pain. She has marked limitations in lumbar ROM and core weakness. She is very tender across the low back, in the right gluteals and left QL. She will benefit from PT to address these benefits. Plan is to do aquatic therapy one day per wk and clinic PT one day.    Personal Factors and Comorbidities  Comorbidity 3+    Comorbidities  RA, OA, interstitial lung disease, anxiety, anemia, left knee TKA 04/05/19, Right knee to be replaced, depression    Examination-Activity Limitations  Bend;Stairs    Stability/Clinical Decision Making  Evolving/Moderate complexity    Clinical Decision  Making  Moderate    Rehab Potential  Good    PT Frequency  2x / week    PT Duration  8 weeks    PT Treatment/Interventions  ADLs/Self Care Home Management;Cryotherapy;Technical brewer;Therapeutic activities;Therapeutic exercise;Neuromuscular re-education;Patient/family education;Manual techniques;Dry needling;Taping;Spinal Manipulations;Joint Manipulations    PT Next Visit Plan  Try hip flexor stretch in pool; Lumbar mobility, Work to decrease pain in low back, Core strength, equal WB, LE flexibility  PT Home Exercise Plan  [email or web address]    Consulted and Agree with Plan of Care  Patient       Patient will benefit from skilled therapeutic intervention in order to improve the following deficits and impairments:  Abnormal gait, Decreased range of motion, Pain, Increased muscle spasms, Impaired flexibility, Postural dysfunction, Decreased strength  Visit Diagnosis: Chronic bilateral low back pain without sciatica - Plan: PT plan of care cert/re-cert  Cramp and spasm - Plan: PT plan of care cert/re-cert  Abnormal posture - Plan: PT plan of care cert/re-cert  Muscle weakness (generalized) - Plan: PT plan of care cert/re-cert     Problem List Patient Active Problem List   Diagnosis Date Noted  . OA (osteoarthritis) of knee 04/04/2019  . Acute bronchitis 02/13/2017  . Cough variant asthma  vs uacs  12/17/2016  . Morbid obesity due to excess calories (HCC) 12/17/2016  . Contracture of left elbow 11/21/2016  . Primary osteoarthritis of both knees 11/21/2016  . Pain in right hand 09/09/2016  . Pain in left hand 09/09/2016  . History of osteoarthritis 09/09/2016  . Rheumatoid factor positive 08/01/2016  . High risk medication use 01/16/2015  . G6PD deficiency 10/31/2014  . Dyspnea 08/23/2014  . ILD (interstitial lung disease) (HCC) 08/23/2014  . Other organ or system involvement in systemic lupus erythematosus (HCC) 08/23/2014   Solon Palm PT 08/31/2019, 6:47 PM  [facility] [address] [place], [place], [address] Phone: [phone]   Fax:  [phone]  Name: Shannon [name] MRN: [id number] Date of Birth: 12-24-1970

## 2019-09-05 DIAGNOSIS — G894 Chronic pain syndrome: Secondary | ICD-10-CM | POA: Diagnosis not present

## 2019-09-05 DIAGNOSIS — M545 Low back pain: Secondary | ICD-10-CM | POA: Diagnosis not present

## 2019-09-05 DIAGNOSIS — M25561 Pain in right knee: Secondary | ICD-10-CM | POA: Diagnosis not present

## 2019-09-05 DIAGNOSIS — M329 Systemic lupus erythematosus, unspecified: Secondary | ICD-10-CM | POA: Diagnosis not present

## 2019-09-05 DIAGNOSIS — Z96652 Presence of left artificial knee joint: Secondary | ICD-10-CM | POA: Diagnosis not present

## 2019-09-08 ENCOUNTER — Ambulatory Visit: Payer: Medicare HMO | Admitting: Physical Therapy

## 2019-09-09 ENCOUNTER — Ambulatory Visit: Payer: Medicare HMO | Admitting: Physical Therapy

## 2019-09-14 ENCOUNTER — Ambulatory Visit: Payer: Medicare HMO | Admitting: Physical Therapy

## 2019-09-14 ENCOUNTER — Other Ambulatory Visit: Payer: Self-pay

## 2019-09-14 ENCOUNTER — Encounter: Payer: Self-pay | Admitting: Physical Therapy

## 2019-09-14 DIAGNOSIS — G8929 Other chronic pain: Secondary | ICD-10-CM | POA: Diagnosis not present

## 2019-09-14 DIAGNOSIS — M545 Low back pain, unspecified: Secondary | ICD-10-CM

## 2019-09-14 DIAGNOSIS — R293 Abnormal posture: Secondary | ICD-10-CM

## 2019-09-14 DIAGNOSIS — M6281 Muscle weakness (generalized): Secondary | ICD-10-CM

## 2019-09-14 DIAGNOSIS — R252 Cramp and spasm: Secondary | ICD-10-CM

## 2019-09-14 NOTE — Therapy (Signed)
[facility] [address], [address] [place], [place], [address] Phone: [phone]   Fax:  [phone]  Physical Therapy Treatment  Patient Details  Name: Shannon [name] MRN: [id number] Date of Birth: 1971-03-26 Referring Provider (PT): Dawayne Cirri MD   Encounter Date: 09/14/2019  PT End of Session - 09/14/19 0928    Visit Number  2    Date for PT Re-Evaluation  10/26/19    Authorization Type  aetna medicare/ medicaid    PT Start Time  [id number]    PT Stop Time  1015    PT Time Calculation (min)  47 min    Activity Tolerance  Patient tolerated treatment well    Behavior During Therapy  [facility] for tasks assessed/performed       Past Medical History:  Diagnosis Date  . Anemia   . Anxiety   . G6PD deficiency   . ILD (interstitial lung disease) (HCC)   . Lupus (HCC)   . Rheumatoid arthritis [facility])     Past Surgical History:  Procedure Laterality Date  . ABDOMINAL HYSTERECTOMY    . APPENDECTOMY  1980  . cardiac catherization  08/20/2017  . CARDIAC CATHETERIZATION N/A 05/10/2015   Procedure: Right Heart Cath;  Surgeon: Laurey Morale, MD;  Location: [facility] INVASIVE CV LAB;  Service: Cardiovascular;  Laterality: N/A;  . CESAREAN SECTION  '95, '02, '07   X 3  . CHOLECYSTECTOMY  2010  . LAPAROSCOPIC HYSTERECTOMY  10/2015   have ovaries  . RIGHT HEART CATH N/A 08/20/2017   Procedure: RIGHT HEART CATH;  Surgeon: Laurey Morale, MD;  Location: [facility] INVASIVE CV LAB;  Service: Cardiovascular;  Laterality: N/A;  . TOTAL KNEE ARTHROPLASTY Left 04/04/2019   Procedure: TOTAL KNEE ARTHROPLASTY;  Surgeon: Ollen Gross, MD;  Location: [facility];  Service: Orthopedics;  Laterality: Left;   . TUBAL LIGATION      There were no vitals filed for this visit.  Subjective Assessment - 09/14/19 0933    Subjective  Tolerable back painthis AM . Reports doing her HEP.    Pertinent History  RA, OA, interstitial lung disease, anxiety, anemia, left  knee TKA 04/05/19, Right knee to be replaced, depression    Limitations  Sitting    How long can you sit comfortably?  15-20 min depending on chair (recliner best); straight back painful    Diagnostic tests  none    Patient Stated Goals  be able to walk without stiffness, sit longer comfortably    Currently in Pain?  Yes   Lt low back: 4/10   Multiple Pain Sites  No                       [facility] Adult PT Treatment/Exercise - 09/14/19 0001      Lumbar Exercises: Stretches   Active Hamstring Stretch  Left;3 reps;30 seconds    Active Hamstring Stretch Limitations  VC for technique, strap    Lower Trunk Rotation  3 reps;10 seconds   gentle rocking to help decompress/lessen guarding 20x-ish   Other Lumbar Stretch Exercise  Bil leg lengthener stretch 5x 3 sec hold     Other Lumbar Stretch Exercise  Blue ball forward flexion stretch 10x.       Lumbar Exercises: Aerobic   Nustep  L1 x 6 min with PTA present to discuss status      Knee/Hip Exercises: Stretches   Hip Flexor Stretch  Left;3 reps;20 seconds  On second step     Knee/Hip Exercises: Standing   Hip Extension  --   Standing, with knee bent: 10x, VC & TC     Knee/Hip Exercises: Supine   Bridges  AROM;Strengthening;Both;2 sets;5 reps               PT Short Term Goals - 08/31/19 1837      PT SHORT TERM GOAL #1   Title  Ind with inital HEP for lumbar and hip flexibility.    Time  2    Period  Weeks    Status  New    Target Date  09/14/19      PT SHORT TERM GOAL #2   Title  Patient able to demonstrate correct body mechanics for bending and lifting to prevent further injury.    Time  4    Period  Weeks    Status  New    Target Date  09/28/19      PT SHORT TERM GOAL #3   Title  Patient to demo improved standing posture with neutral trunk and equal WB to decrease strain on low back.    Time  4    Period  Weeks    Status  New    Target Date  09/28/19        PT Long Term Goals - 08/31/19 1837       PT LONG TERM GOAL #1   Title  Patient to report decreased pain in the low back with ADLs by 70% or more.    Time  8    Period  Weeks    Status  New    Target Date  10/26/19      PT LONG TERM GOAL #2   Title  Patient able to climb stairs without an increase in back pain.    Time  8    Period  Weeks    Status  New      PT LONG TERM GOAL #3   Title  Patient to report decreased stiffness with waking by 70% or more.    Time  8    Period  Weeks    Status  New      PT LONG TERM GOAL #4   Title  Patient to report increased sitting tolerance to 1 hour.    Time  8    Period  Weeks    Status  New            Plan - 09/14/19 7408    Clinical Impression Statement  Pt complinat and independent in initial HEP. Pt presents with "tolerable" Lt lumbar pain today. Pt demonstrates decreased ROM in LT hip extension when walking and with her exercises. She was given standing hip extension with bent knee to help get her femur moving better. Pain was down to a 1/10 at end of session.    Personal Factors and Comorbidities  Comorbidity 3+    Comorbidities  RA, OA, interstitial lung disease, anxiety, anemia, left knee TKA 04/05/19, Right knee to be replaced, depression    Examination-Activity Limitations  Bend;Stairs    Stability/Clinical Decision Making  Evolving/Moderate complexity    Rehab Potential  Good    PT Frequency  2x / week    PT Duration  8 weeks    PT Treatment/Interventions  ADLs/Self Care Home Management;Cryotherapy;Technical brewer;Therapeutic activities;Therapeutic exercise;Neuromuscular re-education;Patient/family education;Manual techniques;Dry needling;Taping;Spinal Manipulations;Joint Manipulations    PT Next Visit Plan  Nustep, flexibility of hip fleoxors/quads,  review of new HEP given today.    PT Home Exercise Plan  [email or web address]    Consulted and Agree with Plan of Care  Patient       Patient will benefit from skilled  therapeutic intervention in order to improve the following deficits and impairments:  Abnormal gait, Decreased range of motion, Pain, Increased muscle spasms, Impaired flexibility, Postural dysfunction, Decreased strength  Visit Diagnosis: Chronic bilateral low back pain without sciatica  Cramp and spasm  Abnormal posture  Muscle weakness (generalized)     Problem List Patient Active Problem List   Diagnosis Date Noted  . OA (osteoarthritis) of knee 04/04/2019  . Acute bronchitis 02/13/2017  . Cough variant asthma  vs uacs  12/17/2016  . Morbid obesity due to excess calories ([place]) 12/17/2016  . Contracture of left elbow 11/21/2016  . Primary osteoarthritis of both knees 11/21/2016  . Pain in right hand 09/09/2016  . Pain in left hand 09/09/2016  . History of osteoarthritis 09/09/2016  . Rheumatoid factor positive 08/01/2016  . High risk medication use 01/16/2015  . G6PD deficiency 10/31/2014  . Dyspnea 08/23/2014  . ILD (interstitial lung disease) ([place]) 08/23/2014  . Other organ or system involvement in systemic lupus erythematosus ([place]) 08/23/2014    Fletcher Ostermiller, PTA 09/14/2019, 10:18 AM  [facility] [address] [place], [place], [address] Phone: [phone]   Fax:  [phone]  Name: Shannon [name] MRN: [id number] Date of Birth: 07-Apr-1971  Access Code: [email or web address]: [email or web address]: 03/24/2021Prepared by: Anderson Malta CochranExercises  Bridge - 1 x daily - 7 x weekly - 10 reps - 1-3 sets  Supine Lower Trunk Rotation - 1 x daily - 7 x weekly - 5 reps - 1 sets - 10 sec hold  Supine Hip Flexor Stretch with Weight - 2 x daily - 7 x weekly - 3 sets - 5 reps  Standing Hip Extension with Knee Bent - 2 x daily - 7 x weekly - 2 sets - 10 reps

## 2019-09-22 DIAGNOSIS — Z96652 Presence of left artificial knee joint: Secondary | ICD-10-CM | POA: Diagnosis not present

## 2019-09-22 DIAGNOSIS — D649 Anemia, unspecified: Secondary | ICD-10-CM | POA: Insufficient documentation

## 2019-09-22 DIAGNOSIS — M1712 Unilateral primary osteoarthritis, left knee: Secondary | ICD-10-CM | POA: Diagnosis not present

## 2019-09-26 ENCOUNTER — Encounter: Payer: Self-pay | Admitting: Physical Therapy

## 2019-09-26 ENCOUNTER — Ambulatory Visit: Payer: Medicare HMO | Attending: Rheumatology | Admitting: Physical Therapy

## 2019-09-26 ENCOUNTER — Other Ambulatory Visit: Payer: Self-pay

## 2019-09-26 DIAGNOSIS — R252 Cramp and spasm: Secondary | ICD-10-CM | POA: Insufficient documentation

## 2019-09-26 DIAGNOSIS — M6281 Muscle weakness (generalized): Secondary | ICD-10-CM

## 2019-09-26 DIAGNOSIS — R293 Abnormal posture: Secondary | ICD-10-CM

## 2019-09-26 DIAGNOSIS — G8929 Other chronic pain: Secondary | ICD-10-CM

## 2019-09-26 DIAGNOSIS — M545 Low back pain: Secondary | ICD-10-CM | POA: Insufficient documentation

## 2019-09-26 NOTE — Patient Instructions (Signed)

## 2019-09-26 NOTE — Therapy (Signed)
[facility] [address], [place], [place], [address] Phone: [phone]   Fax:  [phone]  Physical Therapy Treatment  Patient Details  Name: Shannon [name] MRN: [id number] Date of Birth: 03/21/1971 Referring Provider (PT): Anne Shutter MD   Encounter Date: 09/26/2019  PT End of Session - 09/26/19 1102    Visit Number  2    Date for PT Re-Evaluation  10/26/19    Authorization Type  aetna medicare/ medicaid    PT Start Time  1102    PT Stop Time  1149    PT Time Calculation (min)  47 min    Activity Tolerance  Patient tolerated treatment well    Behavior During Therapy  [facility] for tasks assessed/performed       Past Medical History:  Diagnosis Date  . Anemia   . Anxiety   . G6PD deficiency   . ILD (interstitial lung disease) ([place])   . Lupus ([place])   . Rheumatoid arthritis [facility])     Past Surgical History:  Procedure Laterality Date  . ABDOMINAL HYSTERECTOMY    . APPENDECTOMY  1980  . cardiac catherization  08/20/2017  . CARDIAC CATHETERIZATION N/A 05/10/2015   Procedure: Right Heart Cath;  Surgeon: Larey Dresser, MD;  Location: [place] CV LAB;  Service: Cardiovascular;  Laterality: N/A;  . CESAREAN SECTION  '95, '02, '07   X 3  . CHOLECYSTECTOMY  2010  . LAPAROSCOPIC HYSTERECTOMY  10/2015   have ovaries  . RIGHT HEART CATH N/A 08/20/2017   Procedure: RIGHT HEART CATH;  Surgeon: Larey Dresser, MD;  Location: [place] CV LAB;  Service: Cardiovascular;  Laterality: N/A;  . TOTAL KNEE ARTHROPLASTY Left 04/04/2019   Procedure: TOTAL KNEE ARTHROPLASTY;  Surgeon: Gaynelle Arabian, MD;  [facility];  Service: Orthopedics;  Laterality: Left;  16mn  . TUBAL LIGATION      There were no vitals filed for this visit.  Subjective Assessment - 09/26/19 1106    Subjective  Walking with cane today. This helps decrease he pain.    Pertinent History  RA, OA, interstitial lung disease, anxiety, anemia, left  knee TKA 04/05/19, Right knee to be replaced, depression    Currently in Pain?  Yes   Lt back to hip laterally down leg   Pain Score  4     Pain Orientation  Right    Pain Descriptors / Indicators  Radiating;Sharp    Aggravating Factors   walking    Pain Relieving Factors  reclining position                       [facility] Adult PT Treatment/Exercise - 09/26/19 0001      Self-Care   Self-Care  Lifting;Posture    Posture  Lumbar protective body mechanics for ADLs      Lumbar Exercises: Stretches   Lower Trunk Rotation  3 reps;20 seconds    Lower Trunk Rotation Limitations  Pt visably rotating farther than last session    Standing Side Bend  Right;Left;2 reps;10 seconds    Standing Side Bend Limitations  Seated    Other Lumbar Stretch Exercise  Bil leg lengthener stretch 5x 10 sec hold    VC to engage abdominals to prevent ant tilt     Lumbar Exercises: Aerobic   Nustep  L1 x 6 min: Slow, PTA present to monitor      Lumbar Exercises: Supine   Clam  10  reps    Bent Knee Raise  10 reps    Bent Knee Raise Limitations  VC  to lower foot slowly vs plopping  foot down               PT Short Term Goals - 09/26/19 1524      PT SHORT TERM GOAL #1   Title  Ind with inital HEP for lumbar and hip flexibility.    Time  2    Period  Weeks    Status  Partially Met    Target Date  09/14/19      PT SHORT TERM GOAL #3   Title  Patient to demo improved standing posture with neutral trunk and equal WB to decrease strain on low back.    Time  4    Period  Weeks    Status  On-going        PT Long Term Goals - 08/31/19 1837      PT LONG TERM GOAL #1   Title  Patient to report decreased pain in the low back with ADLs by 70% or more.    Time  8    Period  Weeks    Status  New    Target Date  10/26/19      PT LONG TERM GOAL #2   Title  Patient able to climb stairs without an increase in back pain.    Time  8    Period  Weeks    Status  New      PT LONG TERM  GOAL #3   Title  Patient to report decreased stiffness with waking by 70% or more.    Time  8    Period  Weeks    Status  New      PT LONG TERM GOAL #4   Title  Patient to report increased sitting tolerance to 1 hour.    Time  8    Period  Weeks    Status  New            Plan - 09/26/19 1137    Clinical Impression Statement  Pt presents today with a cane for ambulation. She reports this reduces her pain in her Lt back and hip. Pt could not repeat the hip flex/ext AROM in standing given for HEP.We modified the exercise to be more of a static hip flexor stretch with the addition of a lateral trunk bend RT to stretch through her hip and back. Pt reports mostly 'tightness kind of pain" with the stretches today vs "bad pain." Pt was also educated in lumbar protective body mechanics for ADLS. Pt did demonstrate good core stability with basic LE movements.    Personal Factors and Comorbidities  Comorbidity 3+    Comorbidities  RA, OA, interstitial lung disease, anxiety, anemia, left knee TKA 04/05/19, Right knee to be replaced, depression    Examination-Activity Limitations  Bend;Stairs    Stability/Clinical Decision Making  Evolving/Moderate complexity    Rehab Potential  Good    PT Frequency  2x / week    PT Duration  8 weeks    PT Treatment/Interventions  ADLs/Self Care Home Management;Cryotherapy;Therapist, sports;Therapeutic activities;Therapeutic exercise;Neuromuscular re-education;Patient/family education;Manual techniques;Dry needling;Taping;Spinal Manipulations;Joint Manipulations    PT Next Visit Plan  Nustep, flexibility of hip fleoxors/quads, review of new HEP given today.    PT Home Exercise Plan  [id number]    Consulted and Agree with Plan of Care  Patient  Patient will benefit from skilled therapeutic intervention in order to improve the following deficits and impairments:  Abnormal gait, Decreased range of motion, Pain,  Increased muscle spasms, Impaired flexibility, Postural dysfunction, Decreased strength  Visit Diagnosis: Chronic bilateral low back pain without sciatica  Cramp and spasm  Abnormal posture  Muscle weakness (generalized)     Problem List Patient Active Problem List   Diagnosis Date Noted  . OA (osteoarthritis) of knee 04/04/2019  . Acute bronchitis 02/13/2017  . Cough variant asthma  vs uacs  12/17/2016  . Morbid obesity due to excess calories ([place]) 12/17/2016  . Contracture of left elbow 11/21/2016  . Primary osteoarthritis of both knees 11/21/2016  . Pain in right hand 09/09/2016  . Pain in left hand 09/09/2016  . History of osteoarthritis 09/09/2016  . Rheumatoid factor positive 08/01/2016  . High risk medication use 01/16/2015  . G6PD deficiency 10/31/2014  . Dyspnea 08/23/2014  . ILD (interstitial lung disease) ([place]) 08/23/2014  . Other organ or system involvement in systemic lupus erythematosus ([place]) 08/23/2014    Shannon [name], PTA 09/26/2019, 3:29 PM  [facility] [address] [place], [place], [address] Phone: [phone]   Fax:  [phone]  Name: Shannon [name] MRN: [id number] Date of Birth: 1971-03-17

## 2019-09-28 DIAGNOSIS — D649 Anemia, unspecified: Secondary | ICD-10-CM | POA: Diagnosis not present

## 2019-09-28 DIAGNOSIS — Z0000 Encounter for general adult medical examination without abnormal findings: Secondary | ICD-10-CM | POA: Diagnosis not present

## 2019-09-28 DIAGNOSIS — M25569 Pain in unspecified knee: Secondary | ICD-10-CM | POA: Diagnosis not present

## 2019-09-28 DIAGNOSIS — Z6841 Body Mass Index (BMI) 40.0 and over, adult: Secondary | ICD-10-CM | POA: Diagnosis not present

## 2019-09-28 DIAGNOSIS — Z01818 Encounter for other preprocedural examination: Secondary | ICD-10-CM | POA: Diagnosis not present

## 2019-09-28 DIAGNOSIS — Z1231 Encounter for screening mammogram for malignant neoplasm of breast: Secondary | ICD-10-CM | POA: Diagnosis not present

## 2019-09-30 ENCOUNTER — Encounter: Payer: Medicare HMO | Admitting: Physical Therapy

## 2019-10-04 DIAGNOSIS — Z96652 Presence of left artificial knee joint: Secondary | ICD-10-CM | POA: Diagnosis not present

## 2019-10-04 DIAGNOSIS — G894 Chronic pain syndrome: Secondary | ICD-10-CM | POA: Diagnosis not present

## 2019-10-04 DIAGNOSIS — M545 Low back pain: Secondary | ICD-10-CM | POA: Diagnosis not present

## 2019-10-04 DIAGNOSIS — M329 Systemic lupus erythematosus, unspecified: Secondary | ICD-10-CM | POA: Diagnosis not present

## 2019-10-04 DIAGNOSIS — M25561 Pain in right knee: Secondary | ICD-10-CM | POA: Diagnosis not present

## 2019-10-05 ENCOUNTER — Ambulatory Visit: Payer: Medicare HMO | Admitting: Physical Therapy

## 2019-10-05 ENCOUNTER — Other Ambulatory Visit: Payer: Self-pay

## 2019-10-05 ENCOUNTER — Encounter: Payer: Self-pay | Admitting: Physical Therapy

## 2019-10-05 DIAGNOSIS — M545 Low back pain: Secondary | ICD-10-CM | POA: Diagnosis not present

## 2019-10-05 DIAGNOSIS — R293 Abnormal posture: Secondary | ICD-10-CM | POA: Diagnosis not present

## 2019-10-05 DIAGNOSIS — R252 Cramp and spasm: Secondary | ICD-10-CM | POA: Diagnosis not present

## 2019-10-05 DIAGNOSIS — M6281 Muscle weakness (generalized): Secondary | ICD-10-CM | POA: Diagnosis not present

## 2019-10-05 DIAGNOSIS — G8929 Other chronic pain: Secondary | ICD-10-CM | POA: Diagnosis not present

## 2019-10-05 NOTE — Therapy (Signed)
[facility] [address] [place], [place], [address] Phone: [phone]   Fax:  [phone]  Physical Therapy Treatment  Patient Details  Name: Shannon [name] MRN: [id number] Date of Birth: [birth year] Referring Provider (PT): Anne Shutter MD   Encounter Date: 10/05/2019  PT End of Session - 10/05/19 1019    Visit Number  3    Date for PT Re-Evaluation  10/26/19    Authorization Type  aetna medicare/ medicaid    PT Start Time  1018    PT Stop Time  1101    PT Time Calculation (min)  43 min    Activity Tolerance  Patient tolerated treatment well    Behavior During Therapy  [facility] for tasks assessed/performed       Past Medical History:  Diagnosis Date  . Anemia   . Anxiety   . G6PD deficiency   . ILD (interstitial lung disease) ([place])   . Lupus ([place])   . Rheumatoid arthritis [facility])     Past Surgical History:  Procedure Laterality Date  . ABDOMINAL HYSTERECTOMY    . APPENDECTOMY  1980  . cardiac catherization  08/20/2017  . CARDIAC CATHETERIZATION N/A 05/10/2015   Procedure: Right Heart Cath;  Surgeon: Larey Dresser, MD;  Location: [place] CV LAB;  Service: Cardiovascular;  Laterality: N/A;  . CESAREAN SECTION  '95, '02, '07   X 3  . CHOLECYSTECTOMY  2010  . LAPAROSCOPIC HYSTERECTOMY  10/2015   have ovaries  . RIGHT HEART CATH N/A 08/20/2017   Procedure: RIGHT HEART CATH;  Surgeon: Larey Dresser, MD;  Location: [place] CV LAB;  Service: Cardiovascular;  Laterality: N/A;  . TOTAL KNEE ARTHROPLASTY Left 04/04/2019   Procedure: TOTAL KNEE ARTHROPLASTY;  Surgeon: Gaynelle Arabian, MD;  [facility];  Service: Orthopedics;  Laterality: Left;  54mn  . TUBAL LIGATION      There were no vitals filed for this visit.  Subjective Assessment - 10/05/19 1019    Subjective  Hasn't been able to use pool yet due to death in her family. Patient has appt this Friday.    Pertinent History  RA, OA,  interstitial lung disease, anxiety, anemia, left knee TKA 04/05/19, Right knee to be replaced, depression    How long can you sit comfortably?  15-20 min depending on chair (recliner best); straight back painful    Patient Stated Goals  be able to walk without stiffness, sit longer comfortably    Currently in Pain?  Yes    Pain Score  5     Pain Location  Back    Pain Orientation  Left    Pain Descriptors / Indicators  Radiating                       [facility] Adult PT Treatment/Exercise - 10/05/19 0001      Self-Care   Self-Care  Other Self-Care Comments    Other Self-Care Comments   MFR with ball to left QL, low back and gluteals      Lumbar Exercises: Stretches   Lower Trunk Rotation  3 reps;20 seconds    Standing Side Bend  Left;1 rep;60 seconds    Standing Side Bend Limitations  supine reverse C position    Other Lumbar Stretch Exercise  Bil leg lengthener stretch 5x 10 sec hold    VC to engage abdominals to prevent ant tilt     Lumbar Exercises: Supine  Bent Knee Raise  10 reps    Bridge  5 reps      Lumbar Exercises: Prone   Straight Leg Raise  5 reps    Straight Leg Raises Limitations  3 reps on left; too painful in low back    Other Prone Lumbar Exercises  prone lying over 2 pillows abolishes buttock pain and decreases low back to 3/10    Other Prone Lumbar Exercises  pelvic press 5 sec x 5 with verbal cues to not squeeze gluteals; with unilateral knee bends (painful on left)      Manual Therapy   Manual Therapy  Myofascial release    Myofascial Release  TPR to left QL in SDLY with gentle STM             PT Education - 10/05/19 2001    Education Details  DN education and supine "reverse C" stretch for QL    Person(s) Educated  Patient    Methods  Explanation;Demonstration;Handout    Comprehension  Verbalized understanding;Returned demonstration       PT Short Term Goals - 10/05/19 2007      PT SHORT TERM GOAL #1   Title  Ind with inital  HEP for lumbar and hip flexibility.    Status  Partially Met      PT SHORT TERM GOAL #2   Title  Patient able to demonstrate correct body mechanics for bending and lifting to prevent further injury.    Status  On-going      PT SHORT TERM GOAL #3   Title  Patient to demo improved standing posture with neutral trunk and equal WB to decrease strain on low back.    Status  On-going        PT Long Term Goals - 08/31/19 1837      PT LONG TERM GOAL #1   Title  Patient to report decreased pain in the low back with ADLs by 70% or more.    Time  8    Period  Weeks    Status  New    Target Date  10/26/19      PT LONG TERM GOAL #2   Title  Patient able to climb stairs without an increase in back pain.    Time  8    Period  Weeks    Status  New      PT LONG TERM GOAL #3   Title  Patient to report decreased stiffness with waking by 70% or more.    Time  8    Period  Weeks    Status  New      PT LONG TERM GOAL #4   Title  Patient to report increased sitting tolerance to 1 hour.    Time  8    Period  Weeks    Status  New            Plan - 10/05/19 2001    Clinical Impression Statement  Patient presents today with continued low back and left hip pain. She did well in prone lying over 2 pillows when abolished buttock pain and reduced low back pain to 3/10. I encouraged pt to lie this way for 5 min every 2 hours to decrease pain.  She did well with both supine and prone stabilization exercises although pain limited her from prone hip ext on the left. She responded very well to TPR to left QL and would likely benefit from DN here and in gluteals.  Info was provided and we will try to do at next land visit. She liked the MFR with a ball to low back and gluteals as well and plans to start doing this at home. LTGs are ongoing.    Comorbidities  RA, OA, interstitial lung disease, anxiety, anemia, left knee TKA 04/05/19, Right knee to be replaced, depression    PT Frequency  2x / week    PT  Duration  8 weeks    PT Treatment/Interventions  ADLs/Self Care Home Management;Cryotherapy;Therapist, sports;Therapeutic activities;Therapeutic exercise;Neuromuscular re-education;Patient/family education;Manual techniques;Dry needling;Taping;Spinal Manipulations;Joint Manipulations    PT Next Visit Plan  DN to left QL/gluteals; Nustep, flexibility of hip fleoxors/quads, review of new HEP given today. Assess prone lying over pillows.    PT Home Exercise Plan  [id number]; supine "reverse C" stretch for QL    Consulted and Agree with Plan of Care  Patient       Patient will benefit from skilled therapeutic intervention in order to improve the following deficits and impairments:  Abnormal gait, Decreased range of motion, Pain, Increased muscle spasms, Impaired flexibility, Postural dysfunction, Decreased strength  Visit Diagnosis: Chronic bilateral low back pain without sciatica  Cramp and spasm  Abnormal posture  Muscle weakness (generalized)     Problem List Patient Active Problem List   Diagnosis Date Noted  . OA (osteoarthritis) of knee 04/04/2019  . Acute bronchitis 02/13/2017  . Cough variant asthma  vs uacs  12/17/2016  . Morbid obesity due to excess calories ([place]) 12/17/2016  . Contracture of left elbow 11/21/2016  . Primary osteoarthritis of both knees 11/21/2016  . Pain in right hand 09/09/2016  . Pain in left hand 09/09/2016  . History of osteoarthritis 09/09/2016  . Rheumatoid factor positive 08/01/2016  . High risk medication use 01/16/2015  . G6PD deficiency 10/31/2014  . Dyspnea 08/23/2014  . ILD (interstitial lung disease) ([place]) 08/23/2014  . Other organ or system involvement in systemic lupus erythematosus ([place]) 08/23/2014   Madelyn Flavors PT 10/05/2019, 8:15 PM  [facility] [address], [place], [place], [address] Phone: [phone]   Fax:   [phone]  Name: Shannon [name] MRN: [id number] Date of Birth: Oct 18, 1970

## 2019-10-05 NOTE — Patient Instructions (Signed)
Trigger Point Dry Needling  . What is Trigger Point Dry Needling (DN)? o DN is a physical therapy technique used to treat muscle pain and dysfunction. Specifically, DN helps deactivate muscle trigger points (muscle knots).  o A thin filiform needle is used to penetrate the skin and stimulate the underlying trigger point. The goal is for a local twitch response (LTR) to occur and for the trigger point to relax. No medication of any kind is injected during the procedure.   . What Does Trigger Point Dry Needling Feel Like?  o The procedure feels different for each individual patient. Some patients report that they do not actually feel the needle enter the skin and overall the process is not painful. Very mild bleeding may occur. However, many patients feel a deep cramping in the muscle in which the needle was inserted. This is the local twitch response.   Marland Kitchen How Will I feel after the treatment? o Soreness is normal, and the onset of soreness may not occur for a few hours. Typically this soreness does not last longer than two days.  o Bruising is uncommon, however; ice can be used to decrease any possible bruising.  o In rare cases feeling tired or nauseous after the treatment is normal. In addition, your symptoms may get worse before they get better, this period will typically not last longer than 24 hours.   . What Can I do After My Treatment? o Increase your hydration by drinking more water for the next 24 hours. o You may place ice or heat on the areas treated that have become sore, however, do not use heat on inflamed or bruised areas. Heat often brings more relief post needling. o You can continue your regular activities, but vigorous activity is not recommended initially after the treatment for 24 hours. o DN is best combined with other physical therapy such as strengthening, stretching, and other therapies.     Ball:  Myofascial release using a ball to low back and hips.  Side  Stretch    LIE ON YOUR BACK with your shoulders to the right and hips to the leftt. Move your straight rightt leg to the right and then try to bring the left leg to meet it. Stop when you feel a stretch in the leftt low back. Hold 1-5 min. 1-2 x/day.  You DO NOT have to raise arm overhead as shown.

## 2019-10-06 DIAGNOSIS — F411 Generalized anxiety disorder: Secondary | ICD-10-CM | POA: Diagnosis not present

## 2019-10-06 DIAGNOSIS — R69 Illness, unspecified: Secondary | ICD-10-CM | POA: Diagnosis not present

## 2019-10-07 ENCOUNTER — Encounter: Payer: Self-pay | Admitting: Physical Therapy

## 2019-10-07 ENCOUNTER — Other Ambulatory Visit: Payer: Self-pay

## 2019-10-07 ENCOUNTER — Ambulatory Visit: Payer: Medicare HMO | Admitting: Physical Therapy

## 2019-10-07 DIAGNOSIS — R252 Cramp and spasm: Secondary | ICD-10-CM | POA: Diagnosis not present

## 2019-10-07 DIAGNOSIS — M6281 Muscle weakness (generalized): Secondary | ICD-10-CM

## 2019-10-07 DIAGNOSIS — G8929 Other chronic pain: Secondary | ICD-10-CM | POA: Diagnosis not present

## 2019-10-07 DIAGNOSIS — M545 Low back pain, unspecified: Secondary | ICD-10-CM

## 2019-10-07 DIAGNOSIS — R293 Abnormal posture: Secondary | ICD-10-CM | POA: Diagnosis not present

## 2019-10-07 NOTE — Therapy (Signed)
[facility] [address] [place], [place], [address] Phone: [phone]   Fax:  [phone]  Physical Therapy Treatment  Patient Details  Name: Shannon [name] MRN: [id number] Date of Birth: May 19, 1971 Referring Provider (PT): Anne Shutter MD   Encounter Date: 10/07/2019  PT End of Session - 10/07/19 1540    Visit Number  4    Date for PT Re-Evaluation  10/26/19    Authorization Type  aetna medicare/ medicaid    PT Start Time  1250    PT Stop Time  1335    PT Time Calculation (min)  45 min    Activity Tolerance  Patient tolerated treatment well    Behavior During Therapy  [facility] for tasks assessed/performed       Past Medical History:  Diagnosis Date  . Anemia   . Anxiety   . G6PD deficiency   . ILD (interstitial lung disease) ([place])   . Lupus ([place])   . Rheumatoid arthritis [facility])     Past Surgical History:  Procedure Laterality Date  . ABDOMINAL HYSTERECTOMY    . APPENDECTOMY  1980  . cardiac catherization  08/20/2017  . CARDIAC CATHETERIZATION N/A 05/10/2015   Procedure: Right Heart Cath;  Surgeon: Larey Dresser, MD;  Location: [place] [facility];  Service: Cardiovascular;  Laterality: N/A;  . CESAREAN SECTION  '95, '02, '07   X 3  . CHOLECYSTECTOMY  2010  . LAPAROSCOPIC HYSTERECTOMY  10/2015   have ovaries  . RIGHT HEART CATH N/A 08/20/2017   Procedure: RIGHT HEART CATH;  Surgeon: Larey Dresser, MD;  Location: [place] [facility];  Service: Cardiovascular;  Laterality: N/A;  . TOTAL KNEE ARTHROPLASTY Left 04/04/2019   Procedure: TOTAL KNEE ARTHROPLASTY;  Surgeon: Gaynelle Arabian, MD;  [facility];  Service: Orthopedics;  Laterality: Left;  24mn  . TUBAL LIGATION      There were no vitals filed for this visit.  Subjective Assessment - 10/07/19 1539    Subjective  My leg/hip are feeling better.    Pertinent History  RA, OA, interstitial lung disease, anxiety, anemia, left knee TKA 04/05/19,  Right knee to be replaced, depression    Currently in Pain?  Yes    Pain Score  4     Pain Location  --   Back/hip   Pain Orientation  Left    Pain Descriptors / Indicators  Throbbing;Sore    Aggravating Factors   Walking    Pain Relieving Factors  Sitting, laying down    Multiple Pain Sites  No       Treatment: Aquatic Session Water temp 87.6 degrees, Pt entered and exited pool via 4 stairs using hand rails.   Seated: 75% submersion. Bil LE AROM exercises for ankle, knee, hip. PTA educated pt in water principles and how she can use them. Pt verbally understood.  Standing: Pt about lower waist depth due to her height. 1/4 length of the pool water walking in all directions. Pt required use of blue noodle to give her back some support. Standing against the pool wall for noodle/core compressions. 3 sec hold 6x 1/4 length of pool with high knee marching while performing her noodle/core compressions.  Hip abd 10x alternating, flex/ext bil with light UE support.  Posterior pelvic tilt against pool wall 3 sec hold 10x  Walk full length of pool 2x working on longer step and small trunk rotation  PT Short Term Goals - 10/05/19 2007      PT SHORT TERM GOAL #1   Title  Ind with inital HEP for lumbar and hip flexibility.    Status  Partially Met      PT SHORT TERM GOAL #2   Title  Patient able to demonstrate correct body mechanics for bending and lifting to prevent further injury.    Status  On-going      PT SHORT TERM GOAL #3   Title  Patient to demo improved standing posture with neutral trunk and equal WB to decrease strain on low back.    Status  On-going        PT Long Term Goals - 08/31/19 1837      PT LONG TERM GOAL #1   Title  Patient to report decreased pain in the low back with ADLs by 70% or more.    Time  8    Period  Weeks    Status  New    Target Date  10/26/19      PT LONG TERM GOAL #2   Title  Patient able to  climb stairs without an increase in back pain.    Time  8    Period  Weeks    Status  New      PT LONG TERM GOAL #3   Title  Patient to report decreased stiffness with waking by 70% or more.    Time  8    Period  Weeks    Status  New      PT LONG TERM GOAL #4   Title  Patient to report increased sitting tolerance to 1 hour.    Time  8    Period  Weeks    Status  New            Plan - 10/07/19 1547    Comorbidities  RA, OA, interstitial lung disease, anxiety, anemia, left knee TKA 04/05/19, Right knee to be replaced, depression    Examination-Activity Limitations  Bend;Stairs    Stability/Clinical Decision Making  Evolving/Moderate complexity    Rehab Potential  Good    PT Frequency  2x / week    PT Duration  8 weeks    PT Treatment/Interventions  ADLs/Self Care Home Management;Cryotherapy;Therapist, sports;Therapeutic activities;Therapeutic exercise;Neuromuscular re-education;Patient/family education;Manual techniques;Dry needling;Taping;Spinal Manipulations;Joint Manipulations    PT Next Visit Plan  DN next session to Lt QL/gluteals. See how pt felt with water therapy.    PT Home Exercise Plan  [id number]; supine "reverse C" stretch for QL    Consulted and Agree with Plan of Care  Patient       Patient will benefit from skilled therapeutic intervention in order to improve the following deficits and impairments:  Abnormal gait, Decreased range of motion, Pain, Increased muscle spasms, Impaired flexibility, Postural dysfunction, Decreased strength  Visit Diagnosis: Chronic bilateral low back pain without sciatica  Cramp and spasm  Muscle weakness (generalized)  Abnormal posture     Problem List Patient Active Problem List   Diagnosis Date Noted  . OA (osteoarthritis) of knee 04/04/2019  . Acute bronchitis 02/13/2017  . Cough variant asthma  vs uacs  12/17/2016  . Morbid obesity due to excess calories ([place])  12/17/2016  . Contracture of left elbow 11/21/2016  . Primary osteoarthritis of both knees 11/21/2016  . Pain in right hand 09/09/2016  . Pain in left hand 09/09/2016  . History of osteoarthritis 09/09/2016  . Rheumatoid factor  positive 08/01/2016  . High risk medication use 01/16/2015  . G6PD deficiency 10/31/2014  . Dyspnea 08/23/2014  . ILD (interstitial lung disease) ([place]) 08/23/2014  . Other organ or system involvement in systemic lupus erythematosus ([place]) 08/23/2014    Chattie Greeson, PTA 10/07/2019, 3:49 PM  [facility] [address] [place], [place], [address] Phone: [phone]   Fax:  [phone]  Name: MONROE TOURE MRN: [id number] Date of Birth: 07-Jun-1971

## 2019-10-12 ENCOUNTER — Other Ambulatory Visit: Payer: Self-pay

## 2019-10-12 ENCOUNTER — Ambulatory Visit: Payer: Medicare HMO

## 2019-10-12 DIAGNOSIS — R252 Cramp and spasm: Secondary | ICD-10-CM

## 2019-10-12 DIAGNOSIS — R293 Abnormal posture: Secondary | ICD-10-CM | POA: Diagnosis not present

## 2019-10-12 DIAGNOSIS — M6281 Muscle weakness (generalized): Secondary | ICD-10-CM | POA: Diagnosis not present

## 2019-10-12 DIAGNOSIS — M545 Low back pain: Secondary | ICD-10-CM | POA: Diagnosis not present

## 2019-10-12 DIAGNOSIS — G8929 Other chronic pain: Secondary | ICD-10-CM | POA: Diagnosis not present

## 2019-10-12 NOTE — Therapy (Signed)
[facility] [address] [place], [place], [address] Phone: [phone]   Fax:  [phone]  Physical Therapy Treatment  Patient Details  Name: Shannon [name] MRN: [id number] Date of Birth: [birth year] Referring Provider (PT): Anne Shutter MD   Encounter Date: 10/12/2019  PT End of Session - 10/12/19 1052    Visit Number  5    Date for PT Re-Evaluation  10/26/19    Authorization Type  aetna medicare/ medicaid    Progress Note Due on Visit  10    PT Start Time  1016    PT Stop Time  1055    PT Time Calculation (min)  39 min    Activity Tolerance  Patient tolerated treatment well    Behavior During Therapy  [facility] for tasks assessed/performed       Past Medical History:  Diagnosis Date  . Anemia   . Anxiety   . G6PD deficiency   . ILD (interstitial lung disease) ([place])   . Lupus ([place])   . Rheumatoid arthritis [facility])     Past Surgical History:  Procedure Laterality Date  . ABDOMINAL HYSTERECTOMY    . APPENDECTOMY  1980  . cardiac catherization  08/20/2017  . CARDIAC CATHETERIZATION N/A 05/10/2015   Procedure: Right Heart Cath;  Surgeon: Larey Dresser, MD;  Location: [place] [facility];  Service: Cardiovascular;  Laterality: N/A;  . CESAREAN SECTION  '95, '02, '07   X 3  . CHOLECYSTECTOMY  2010  . LAPAROSCOPIC HYSTERECTOMY  10/2015   have ovaries  . RIGHT HEART CATH N/A 08/20/2017   Procedure: RIGHT HEART CATH;  Surgeon: Larey Dresser, MD;  Location: [place] [facility];  Service: Cardiovascular;  Laterality: N/A;  . TOTAL KNEE ARTHROPLASTY Left 04/04/2019   Procedure: TOTAL KNEE ARTHROPLASTY;  Surgeon: Gaynelle Arabian, MD;  [facility];  Service: Orthopedics;  Laterality: Left;  15mn  . TUBAL LIGATION      There were no vitals filed for this visit.  Subjective Assessment - 10/12/19 1021    Subjective  I liked the pool.  I'm ready to try dry needling    Currently in Pain?  Yes    Pain Score  4      Pain Location  Back    Pain Orientation  Left                       [facility] Adult PT Treatment/Exercise - 10/12/19 0001      Lumbar Exercises: Stretches   Lower Trunk Rotation  3 reps;20 seconds      Modalities   Modalities  Moist Heat      Moist Heat Therapy   Number Minutes Moist Heat  10 Minutes    Moist Heat Location  Lumbar Spine      Manual Therapy   Manual Therapy  Myofascial release    Myofascial Release  bil lumbar paraspinals and gluteals       Trigger Point Dry Needling - 10/12/19 0001    Consent Given?  Yes    Education Handout Provided  Yes    Muscles Treated Back/Hip  Gluteus medius;Gluteus minimus;Gluteus maximus;Lumbar multifidi    Gluteus Minimus Response  Twitch response elicited;Palpable increased muscle length    Gluteus Medius Response  Twitch response elicited;Palpable increased muscle length    Gluteus Maximus Response  Twitch response elicited;Palpable increased muscle length    Lumbar multifidi Response  Twitch response elicited;Palpable increased muscle  length             PT Short Term Goals - 10/05/19 2007      PT SHORT TERM GOAL #1   Title  Ind with inital HEP for lumbar and hip flexibility.    Status  Partially Met      PT SHORT TERM GOAL #2   Title  Patient able to demonstrate correct body mechanics for bending and lifting to prevent further injury.    Status  On-going      PT SHORT TERM GOAL #3   Title  Patient to demo improved standing posture with neutral trunk and equal WB to decrease strain on low back.    Status  On-going        PT Long Term Goals - 08/31/19 1837      PT LONG TERM GOAL #1   Title  Patient to report decreased pain in the low back with ADLs by 70% or more.    Time  8    Period  Weeks    Status  New    Target Date  10/26/19      PT LONG TERM GOAL #2   Title  Patient able to climb stairs without an increase in back pain.    Time  8    Period  Weeks    Status  New      PT LONG TERM  GOAL #3   Title  Patient to report decreased stiffness with waking by 70% or more.    Time  8    Period  Weeks    Status  New      PT LONG TERM GOAL #4   Title  Patient to report increased sitting tolerance to 1 hour.    Time  8    Period  Weeks    Status  New            Plan - 10/12/19 1050    Clinical Impression Statement  Pt with 4/10 LBP today and has been active with her HEP.  Pt did well with aquatic PT last session although denies any change in her pain overall.  Pt with significant Lt lumbar and gluteal tension/trigger points with dry needling and manual therapy and demonstrated improved tissue mobility after dry needling and manual therapy today.  Pt will continue to benefit from skilled PT to reduce LBP, improve core strength and flexibility.    PT Frequency  2x / week    PT Duration  8 weeks    PT Treatment/Interventions  ADLs/Self Care Home Management;Cryotherapy;Therapist, sports;Therapeutic activities;Therapeutic exercise;Neuromuscular re-education;Patient/family education;Manual techniques;Dry needling;Taping;Spinal Manipulations;Joint Manipulations    PT Next Visit Plan  DN to quadratus next session in the clinic in addition to lumbar and gluteals.  Aquatic therapy.    PT Home Exercise Plan  [id number]; supine "reverse C" stretch for QL    Consulted and Agree with Plan of Care  Patient       Patient will benefit from skilled therapeutic intervention in order to improve the following deficits and impairments:  Abnormal gait, Decreased range of motion, Pain, Increased muscle spasms, Impaired flexibility, Postural dysfunction, Decreased strength  Visit Diagnosis: Chronic bilateral low back pain without sciatica  Cramp and spasm  Muscle weakness (generalized)  Abnormal posture     Problem List Patient Active Problem List   Diagnosis Date Noted  . OA (osteoarthritis) of knee 04/04/2019  . Acute bronchitis  02/13/2017  . Cough variant asthma  vs uacs  12/17/2016  . Morbid obesity due to excess calories ([place]) 12/17/2016  . Contracture of left elbow 11/21/2016  . Primary osteoarthritis of both knees 11/21/2016  . Pain in right hand 09/09/2016  . Pain in left hand 09/09/2016  . History of osteoarthritis 09/09/2016  . Rheumatoid factor positive 08/01/2016  . High risk medication use 01/16/2015  . G6PD deficiency 10/31/2014  . Dyspnea 08/23/2014  . ILD (interstitial lung disease) ([place]) 08/23/2014  . Other organ or system involvement in systemic lupus erythematosus ([place]) 08/23/2014    Sigurd Sos, PT 10/12/19 10:54 AM  [facility] [address] [place], [place], [address] Phone: [phone]   Fax:  [phone]  Name: Shannon [name] MRN: [id number] Date of Birth: [birth year]

## 2019-10-13 DIAGNOSIS — D649 Anemia, unspecified: Secondary | ICD-10-CM | POA: Diagnosis not present

## 2019-10-14 ENCOUNTER — Other Ambulatory Visit: Payer: Self-pay

## 2019-10-14 ENCOUNTER — Ambulatory Visit: Payer: Medicare HMO | Admitting: Physical Therapy

## 2019-10-14 ENCOUNTER — Encounter: Payer: Self-pay | Admitting: Physical Therapy

## 2019-10-14 DIAGNOSIS — G8929 Other chronic pain: Secondary | ICD-10-CM | POA: Diagnosis not present

## 2019-10-14 DIAGNOSIS — M6281 Muscle weakness (generalized): Secondary | ICD-10-CM | POA: Diagnosis not present

## 2019-10-14 DIAGNOSIS — M545 Low back pain, unspecified: Secondary | ICD-10-CM

## 2019-10-14 DIAGNOSIS — R293 Abnormal posture: Secondary | ICD-10-CM

## 2019-10-14 DIAGNOSIS — R252 Cramp and spasm: Secondary | ICD-10-CM | POA: Diagnosis not present

## 2019-10-14 NOTE — Therapy (Signed)
[facility] [address] [place], [place], [address] Phone: [phone]   Fax:  [phone]  Physical Therapy Treatment  Patient Details  Name: Shannon [name] MRN: [id number] Date of Birth: 12-19-1970 Referring Provider (PT): Anne Shutter MD   Encounter Date: 10/14/2019  PT End of Session - 10/14/19 1559    Visit Number  6    Date for PT Re-Evaluation  10/26/19    Authorization Type  aetna medicare/ medicaid    PT Start Time  1300    PT Stop Time  1320    PT Time Calculation (min)  20 min    Activity Tolerance  Patient limited by pain    Behavior During Therapy  [facility] for tasks assessed/performed       Past Medical History:  Diagnosis Date  . Anemia   . Anxiety   . G6PD deficiency   . ILD (interstitial lung disease) ([place])   . Lupus ([place])   . Rheumatoid arthritis [facility])     Past Surgical History:  Procedure Laterality Date  . ABDOMINAL HYSTERECTOMY    . APPENDECTOMY  1980  . cardiac catherization  08/20/2017  . CARDIAC CATHETERIZATION N/A 05/10/2015   Procedure: Right Heart Cath;  Surgeon: Larey Dresser, MD;  Location: [place] [facility];  Service: Cardiovascular;  Laterality: N/A;  . CESAREAN SECTION  '95, '02, '07   X 3  . CHOLECYSTECTOMY  2010  . LAPAROSCOPIC HYSTERECTOMY  10/2015   have ovaries  . RIGHT HEART CATH N/A 08/20/2017   Procedure: RIGHT HEART CATH;  Surgeon: Larey Dresser, MD;  Location: [place] [facility];  Service: Cardiovascular;  Laterality: N/A;  . TOTAL KNEE ARTHROPLASTY Left 04/04/2019   Procedure: TOTAL KNEE ARTHROPLASTY;  Surgeon: Gaynelle Arabian, MD;  [facility];  Service: Orthopedics;  Laterality: Left;  50mn  . TUBAL LIGATION      There were no vitals filed for this visit.  Subjective Assessment - 10/14/19 1630    Subjective  I have been in so much pain since the dry needling. I'm not sure I can do much today my pain is so bad.    Pertinent History  RA, OA,  interstitial lung disease, anxiety, anemia, left knee TKA 04/05/19, Right knee to be replaced, depression    Currently in Pain?  Yes    Pain Score  10-Worst pain ever    Pain Location  Back    Pain Orientation  Left    Pain Descriptors / Indicators  Stabbing;Sharp    Aggravating Factors   The initial needling    Pain Relieving Factors  Not much last 2 days    Multiple Pain Sites  No        Treatment: Aquatic session Water temp 87.6 degrees F Pt ambulating very slowly on the pool deck as getting in/out of the water using 3 steps and Bil hand rails  Seated 75% submersion gentle LE ROMexercises with concurrent pain assessment. Pt in visible pain/discomfort sitting. Attempted hamstring stretching, stopped due to pain.   Decompression hang x 5 min with blue noodle. Extra time to get in position. Very little relief ( pain to 8/10) Ended session as pt was in too much pain.   Verbally instructed pt to go home and do ice x 10 min then heat 177m, and alternate for 1 hr. Pt reports taking OTC meds.  PT Short Term Goals - 10/05/19 2007      PT SHORT TERM GOAL #1   Title  Ind with inital HEP for lumbar and hip flexibility.    Status  Partially Met      PT SHORT TERM GOAL #2   Title  Patient able to demonstrate correct body mechanics for bending and lifting to prevent further injury.    Status  On-going      PT SHORT TERM GOAL #3   Title  Patient to demo improved standing posture with neutral trunk and equal WB to decrease strain on low back.    Status  On-going        PT Long Term Goals - 08/31/19 1837      PT LONG TERM GOAL #1   Title  Patient to report decreased pain in the low back with ADLs by 70% or more.    Time  8    Period  Weeks    Status  New    Target Date  10/26/19      PT LONG TERM GOAL #2   Title  Patient able to climb stairs without an increase in back pain.    Time  8    Period  Weeks    Status  New      PT LONG  TERM GOAL #3   Title  Patient to report decreased stiffness with waking by 70% or more.    Time  8    Period  Weeks    Status  New      PT LONG TERM GOAL #4   Title  Patient to report increased sitting tolerance to 1 hour.    Time  8    Period  Weeks    Status  New            Plan - 10/14/19 1632    Clinical Impression Statement  Pt arrives in 10/10 pain today. She reports this pain occured after the dry needling and has not let up . Pain is local to her LT back. Pt attempted some decompression and gentle stretching in the pool but could not tolerate it and so we ended the session.    Personal Factors and Comorbidities  Comorbidity 3+    Comorbidities  RA, OA, interstitial lung disease, anxiety, anemia, left knee TKA 04/05/19, Right knee to be replaced, depression    Examination-Activity Limitations  Bend;Stairs    Stability/Clinical Decision Making  Evolving/Moderate complexity    PT Frequency  2x / week    PT Duration  8 weeks    PT Treatment/Interventions  ADLs/Self Care Home Management;Cryotherapy;Therapist, sports;Therapeutic activities;Therapeutic exercise;Neuromuscular re-education;Patient/family education;Manual techniques;Dry needling;Taping;Spinal Manipulations;Joint Manipulations    PT Next Visit Plan  Assess pain, pt was instructed to go home and alternate between ice & heat for 1 hr:    PT Home Exercise Plan  [id number]; supine "reverse C" stretch for QL    Consulted and Agree with Plan of Care  Patient       Patient will benefit from skilled therapeutic intervention in order to improve the following deficits and impairments:  Abnormal gait, Decreased range of motion, Pain, Increased muscle spasms, Impaired flexibility, Postural dysfunction, Decreased strength  Visit Diagnosis: Chronic bilateral low back pain without sciatica  Cramp and spasm  Muscle weakness (generalized)  Abnormal posture     Problem  List Patient Active Problem List   Diagnosis Date Noted  . OA (osteoarthritis) of knee 04/04/2019  .  Acute bronchitis 02/13/2017  . Cough variant asthma  vs uacs  12/17/2016  . Morbid obesity due to excess calories ([place]) 12/17/2016  . Contracture of left elbow 11/21/2016  . Primary osteoarthritis of both knees 11/21/2016  . Pain in right hand 09/09/2016  . Pain in left hand 09/09/2016  . History of osteoarthritis 09/09/2016  . Rheumatoid factor positive 08/01/2016  . High risk medication use 01/16/2015  . G6PD deficiency 10/31/2014  . Dyspnea 08/23/2014  . ILD (interstitial lung disease) ([place]) 08/23/2014  . Other organ or system involvement in systemic lupus erythematosus ([place]) 08/23/2014    Alyene Predmore, PTA 10/14/2019, 4:36 PM  [facility] [address] [place], [place], [address] Phone: [phone]   Fax:  [phone]  Name: Shannon [name] MRN: [id number] Date of Birth: Nov 13, 1970

## 2019-10-15 ENCOUNTER — Other Ambulatory Visit (HOSPITAL_COMMUNITY): Payer: Medicare HMO

## 2019-10-19 ENCOUNTER — Other Ambulatory Visit: Payer: Self-pay

## 2019-10-19 ENCOUNTER — Encounter: Payer: Self-pay | Admitting: Physical Therapy

## 2019-10-19 ENCOUNTER — Ambulatory Visit: Payer: Medicare HMO | Admitting: Physical Therapy

## 2019-10-19 DIAGNOSIS — R293 Abnormal posture: Secondary | ICD-10-CM | POA: Diagnosis not present

## 2019-10-19 DIAGNOSIS — G8929 Other chronic pain: Secondary | ICD-10-CM | POA: Diagnosis not present

## 2019-10-19 DIAGNOSIS — M6281 Muscle weakness (generalized): Secondary | ICD-10-CM | POA: Diagnosis not present

## 2019-10-19 DIAGNOSIS — R252 Cramp and spasm: Secondary | ICD-10-CM | POA: Diagnosis not present

## 2019-10-19 DIAGNOSIS — M545 Low back pain: Secondary | ICD-10-CM | POA: Diagnosis not present

## 2019-10-19 NOTE — Therapy (Signed)
[facility] [address] [place], [place], [address] Phone: [phone]   Fax:  [phone]  Physical Therapy Treatment  Patient Details  Name: Shannon [name] MRN: [id number] Date of Birth: Feb 04, 1971 Referring Provider (PT): Dawayne Cirri MD   Encounter Date: 10/19/2019  PT End of Session - 10/19/19 1108    Visit Number  7    Date for PT Re-Evaluation  10/26/19    Authorization Type  aetna medicare/ medicaid    Progress Note Due on Visit  10    PT Start Time  1100    PT Stop Time  1145    PT Time Calculation (min)  45 min    Activity Tolerance  Patient tolerated treatment well    Behavior During Therapy  [facility] for tasks assessed/performed       Past Medical History:  Diagnosis Date  . Anemia   . Anxiety   . G6PD deficiency   . ILD (interstitial lung disease) (HCC)   . Lupus (HCC)   . Rheumatoid arthritis [facility])     Past Surgical History:  Procedure Laterality Date  . ABDOMINAL HYSTERECTOMY    . APPENDECTOMY  1980  . cardiac catherization  08/20/2017  . CARDIAC CATHETERIZATION N/A 05/10/2015   Procedure: Right Heart Cath;  Surgeon: Laurey Morale, MD;  Location: [facility] INVASIVE CV LAB;  Service: Cardiovascular;  Laterality: N/A;  . CESAREAN SECTION  '95, '02, '07   X 3  . CHOLECYSTECTOMY  2010  . LAPAROSCOPIC HYSTERECTOMY  10/2015   have ovaries  . RIGHT HEART CATH N/A 08/20/2017   Procedure: RIGHT HEART CATH;  Surgeon: Laurey Morale, MD;  Location: [facility] INVASIVE CV LAB;  Service: Cardiovascular;  Laterality: N/A;  . TOTAL KNEE ARTHROPLASTY Left 04/04/2019   Procedure: TOTAL KNEE ARTHROPLASTY;  Surgeon: Ollen Gross, MD;  Location: [facility] OR[facility];  Service: Orthopedics;  Laterality: Left;   . TUBAL LIGATION      There were no vitals filed for this visit.  Subjective Assessment - 10/19/19 1110    Subjective  it took till Saturday for my pain to go down. Today it is much better.    Pertinent History  RA,  OA, interstitial lung disease, anxiety, anemia, left knee TKA 04/05/19, Right knee to be replaced, depression    Currently in Pain?  Yes    Pain Score  4     Pain Location  Back    Pain Orientation  Left    Pain Descriptors / Indicators  Sore    Aggravating Factors   I still think the needlin gset me off    Pain Relieving Factors  rest    Multiple Pain Sites  No                       [facility] Adult PT Treatment/Exercise - 10/19/19 0001      Lumbar Exercises: Stretches   Active Hamstring Stretch  Left;2 reps;30 seconds   Added ankle pumps 10x at end of each rep   Single Knee to Chest Stretch  Left;2 reps;30 seconds    Pelvic Tilt  --   Attempted but not much movement: more ab setting   Other Lumbar Stretch Exercise  Seated cat/camel 6-8x TC to cue spinal movement. pt has little articulation     Other Lumbar Stretch Exercise  Seated flexion with blue ball hold 10 sec 8x      Lumbar Exercises: Aerobic   Nustep  L3 x 10 min with PTA present to discuss her pain.       Lumbar Exercises: Supine   Ab Set  5 reps    Glut Set  --   Ball squeeze & glute  cocontraction 5 sec hold 6x   Clam  --   yellow 2x6 VC for core   Bent Knee Raise  10 reps               PT Short Term Goals - 10/19/19 1117      PT SHORT TERM GOAL #1   Title  Ind with inital HEP for lumbar and hip flexibility.    Time  2    Period  Weeks    Status  Achieved    Target Date  09/14/19        PT Long Term Goals - 10/19/19 1117      PT LONG TERM GOAL #1   Title  Patient to report decreased pain in the low back with ADLs by 70% or more.    Time  8    Period  Weeks    Status  On-going   50%     PT LONG TERM GOAL #2   Title  Patient able to climb stairs without an increase in back pain.    Time  8    Period  Weeks    Status  On-going   Limited by back and knee pain     PT LONG TERM GOAL #3   Title  Patient to report decreased stiffness with waking by 70% or more.    Time  8     Period  Weeks    Status  On-going   Comes and goes; better getting up and moving     PT LONG TERM GOAL #4   Title  Patient to report increased sitting tolerance to 1 hour.    Time  8    Period  Weeks    Status  On-going   Depends on surface; hard chair 15 min, recliner 1 hour           Plan - 10/19/19 1109    Clinical Impression Statement  Pt rpeorts her back pain finally resolved by Saturday evening. She applied Biofreeze multiple times and reports that was most beneficial. Heat & ice did not help very much. Pt was able to perorm a variety of hip & lumbar stretches pain free and supine core stabilization. pt does very well contracting her core but cannot figure out how to move her pelvis.  ( No pelvic tilt ). Pt also demonstrates alot of diffculty moving her spine segmentally lik ein seated cat/camel. Pt can sit in recliner for up to an hour but not in a harder chair. Pt describes her improvement at 50% since her initial evaluation.    Personal Factors and Comorbidities  Comorbidity 3+    Comorbidities  RA, OA, interstitial lung disease, anxiety, anemia, left knee TKA 04/05/19, Right knee to be replaced, depression    Examination-Activity Limitations  Bend;Stairs    Stability/Clinical Decision Making  Evolving/Moderate complexity    Rehab Potential  Good    PT Frequency  2x / week    PT Duration  8 weeks    PT Treatment/Interventions  ADLs/Self Care Home Management;Cryotherapy;Therapist, sports;Therapeutic activities;Therapeutic exercise;Neuromuscular re-education;Patient/family education;Manual techniques;Dry needling;Taping;Spinal Manipulations;Joint Manipulations    PT Next Visit Plan  Aquatics next session    PT [place]; supine "reverse  C" stretch for QL    Consulted and Agree with Plan of Care  Patient       Patient will benefit from skilled therapeutic intervention in order to improve the following deficits  and impairments:  Abnormal gait, Decreased range of motion, Pain, Increased muscle spasms, Impaired flexibility, Postural dysfunction, Decreased strength  Visit Diagnosis: Chronic bilateral low back pain without sciatica  Cramp and spasm  Muscle weakness (generalized)  Abnormal posture     Problem List Patient Active Problem List   Diagnosis Date Noted  . OA (osteoarthritis) of knee 04/04/2019  . Acute bronchitis 02/13/2017  . Cough variant asthma  vs uacs  12/17/2016  . Morbid obesity due to excess calories (HCC) 12/17/2016  . Contracture of left elbow 11/21/2016  . Primary osteoarthritis of both knees 11/21/2016  . Pain in right hand 09/09/2016  . Pain in left hand 09/09/2016  . History of osteoarthritis 09/09/2016  . Rheumatoid factor positive 08/01/2016  . High risk medication use 01/16/2015  . G6PD deficiency 10/31/2014  . Dyspnea 08/23/2014  . ILD (interstitial lung disease) (HCC) 08/23/2014  . Other organ or system involvement in systemic lupus erythematosus (HCC) 08/23/2014    Cashmere Harmes, PTA 10/19/2019, 11:47 AM  [facility] [address] [place], [place], [address] Phone: [phone]   Fax:  [phone]  Name: Shannon [name] MRN: [id number] Date of Birth: 12-02-1970

## 2019-10-21 ENCOUNTER — Other Ambulatory Visit: Payer: Self-pay

## 2019-10-21 ENCOUNTER — Encounter: Payer: Self-pay | Admitting: Physical Therapy

## 2019-10-21 ENCOUNTER — Ambulatory Visit: Payer: Medicare HMO | Admitting: Physical Therapy

## 2019-10-21 DIAGNOSIS — R293 Abnormal posture: Secondary | ICD-10-CM

## 2019-10-21 DIAGNOSIS — R252 Cramp and spasm: Secondary | ICD-10-CM

## 2019-10-21 DIAGNOSIS — G8929 Other chronic pain: Secondary | ICD-10-CM

## 2019-10-21 DIAGNOSIS — M6281 Muscle weakness (generalized): Secondary | ICD-10-CM

## 2019-10-21 DIAGNOSIS — M545 Low back pain: Secondary | ICD-10-CM | POA: Diagnosis not present

## 2019-10-21 NOTE — Therapy (Signed)
[facility] [address] [place], [place], [address] Phone: [phone]   Fax:  [phone]  Physical Therapy Treatment  Patient Details  Name: Shannon [name] MRN: [id number] Date of Birth: 11-12-1970 Referring Provider (PT): Dawayne Cirri MD   Encounter Date: 10/21/2019  PT End of Session - 10/21/19 1524    Visit Number  8    Date for PT Re-Evaluation  10/26/19    Authorization Type  aetna medicare/ medicaid    Progress Note Due on Visit  10    PT Start Time  1250    PT Stop Time  1330    PT Time Calculation (min)  40 min    Activity Tolerance  Patient tolerated treatment well    Behavior During Therapy  [facility] for tasks assessed/performed       Past Medical History:  Diagnosis Date  . Anemia   . Anxiety   . G6PD deficiency   . ILD (interstitial lung disease) (HCC)   . Lupus (HCC)   . Rheumatoid arthritis [facility])     Past Surgical History:  Procedure Laterality Date  . ABDOMINAL HYSTERECTOMY    . APPENDECTOMY  1980  . cardiac catherization  08/20/2017  . CARDIAC CATHETERIZATION N/A 05/10/2015   Procedure: Right Heart Cath;  Surgeon: Laurey Morale, MD;  Location: [facility] INVASIVE CV LAB;  Service: Cardiovascular;  Laterality: N/A;  . CESAREAN SECTION  '95, '02, '07   X 3  . CHOLECYSTECTOMY  2010  . LAPAROSCOPIC HYSTERECTOMY  10/2015   have ovaries  . RIGHT HEART CATH N/A 08/20/2017   Procedure: RIGHT HEART CATH;  Surgeon: Laurey Morale, MD;  Location: [facility] INVASIVE CV LAB;  Service: Cardiovascular;  Laterality: N/A;  . TOTAL KNEE ARTHROPLASTY Left 04/04/2019   Procedure: TOTAL KNEE ARTHROPLASTY;  Surgeon: Ollen Gross, MD;  [facility];  Service: Orthopedics;  Laterality: Left;   . TUBAL LIGATION      There were no vitals filed for this visit.  Subjective Assessment - 10/21/19 1616    Subjective  A little sore after my last session. Today I am pretty good. I am having Rt knee replacement in 2  weeks.    Pertinent History  RA, OA, interstitial lung disease, anxiety, anemia, left knee TKA 04/05/19, Right knee to be replaced, depression    Currently in Pain?  Yes    Pain Score  2     Pain Location  Back    Pain Orientation  Left    Pain Descriptors / Indicators  Dull    Multiple Pain Sites  No       Treatment: Aquatics Water temp 87.6 degree F Pt entered and exited pool via 3 stairs using 1 hand rail  Waist deep water walking each direction 1 length ( some fatigue) with the support of a noodle. High knee marching with noodle press 1/4 way of the pool 3x Bil LE hip 3 ways 10x each slowly in painfree ROM.    Seated with 75% submersion: Bil LAQ 10x using more support of the water vs force Thoracic rotation AROm 10x with the noodle Ankle AROM 20x bil                        PT Short Term Goals - 10/19/19 1117      PT SHORT TERM GOAL #1   Title  Ind with inital HEP for lumbar and hip flexibility.  Time  2    Period  Weeks    Status  Achieved    Target Date  09/14/19        PT Long Term Goals - 10/19/19 1117      PT LONG TERM GOAL #1   Title  Patient to report decreased pain in the low back with ADLs by 70% or more.    Time  8    Period  Weeks    Status  On-going   50%     PT LONG TERM GOAL #2   Title  Patient able to climb stairs without an increase in back pain.    Time  8    Period  Weeks    Status  On-going   Limited by back and knee pain     PT LONG TERM GOAL #3   Title  Patient to report decreased stiffness with waking by 70% or more.    Time  8    Period  Weeks    Status  On-going   Comes and goes; better getting up and moving     PT LONG TERM GOAL #4   Title  Patient to report increased sitting tolerance to 1 hour.    Time  8    Period  Weeks    Status  On-going   Depends on surface; hard chair 15 min, recliner 1 hour           Plan - 10/21/19 1617    Clinical Impression Statement  Pt presents for aquatic  session with significantly less back pain than her last. Pt was able to walk farther with little to no pain and produce more resistance through the water for her hip strength/ROM. The bouyancy of the water provide enough support and unweightedness to allow for pain free movement. Pt is going to have RT knee replacement in 2 weeks.    Personal Factors and Comorbidities  Comorbidity 3+    Comorbidities  RA, OA, interstitial lung disease, anxiety, anemia, left knee TKA 04/05/19, Right knee to be replaced, depression    Examination-Activity Limitations  Bend;Stairs    Stability/Clinical Decision Making  Evolving/Moderate complexity    Rehab Potential  Good    PT Frequency  2x / week    PT Duration  8 weeks    PT Treatment/Interventions  ADLs/Self Care Home Management;Cryotherapy;Therapist, sports;Therapeutic activities;Therapeutic exercise;Neuromuscular re-education;Patient/family education;Manual techniques;Dry needling;Taping;Spinal Manipulations;Joint Manipulations    PT Next Visit Plan  Land session next    PT Home Exercise Plan  [id number]; supine "reverse C" stretch for QL    Consulted and Agree with Plan of Care  Patient       Patient will benefit from skilled therapeutic intervention in order to improve the following deficits and impairments:  Abnormal gait, Decreased range of motion, Pain, Increased muscle spasms, Impaired flexibility, Postural dysfunction, Decreased strength  Visit Diagnosis: Chronic bilateral low back pain without sciatica  Muscle weakness (generalized)  Cramp and spasm  Abnormal posture     Problem List Patient Active Problem List   Diagnosis Date Noted  . OA (osteoarthritis) of knee 04/04/2019  . Acute bronchitis 02/13/2017  . Cough variant asthma  vs uacs  12/17/2016  . Morbid obesity due to excess calories ([place]) 12/17/2016  . Contracture of left elbow 11/21/2016  . Primary osteoarthritis of both knees  11/21/2016  . Pain in right hand 09/09/2016  . Pain in left hand 09/09/2016  . History of osteoarthritis 09/09/2016  .  Rheumatoid factor positive 08/01/2016  . High risk medication use 01/16/2015  . G6PD deficiency 10/31/2014  . Dyspnea 08/23/2014  . ILD (interstitial lung disease) (HCC) 08/23/2014  . Other organ or system involvement in systemic lupus erythematosus (HCC) 08/23/2014    Cammy Sanjurjo, PTA 10/21/2019, 8:55 PM  [facility] [address], [address] [place], [place], [address] Phone: [phone]   Fax:  [phone]  Name: KENDELLE SCHWEERS MRN: [id number] Date of Birth: [birth year]

## 2019-10-26 ENCOUNTER — Other Ambulatory Visit: Payer: Self-pay

## 2019-10-26 ENCOUNTER — Ambulatory Visit: Payer: Medicare HMO | Attending: Rheumatology | Admitting: Physical Therapy

## 2019-10-26 ENCOUNTER — Encounter: Payer: Self-pay | Admitting: Physical Therapy

## 2019-10-26 DIAGNOSIS — M545 Low back pain: Secondary | ICD-10-CM | POA: Insufficient documentation

## 2019-10-26 DIAGNOSIS — M6281 Muscle weakness (generalized): Secondary | ICD-10-CM

## 2019-10-26 DIAGNOSIS — R293 Abnormal posture: Secondary | ICD-10-CM

## 2019-10-26 DIAGNOSIS — R252 Cramp and spasm: Secondary | ICD-10-CM | POA: Diagnosis not present

## 2019-10-26 DIAGNOSIS — G8929 Other chronic pain: Secondary | ICD-10-CM | POA: Diagnosis not present

## 2019-10-26 NOTE — Therapy (Signed)
[facility] [address], [place], [place], [address] Phone: [phone]   Fax:  [phone]  Physical Therapy Treatment  Patient Details  Name: Shannon [name] MRN: [id number] Date of Birth: Mar 07, 1971 Referring Provider (PT): Anne Shutter MD   Encounter Date: 10/26/2019   Progress Note Reporting Period 08/31/19 to 10/26/19  See note below for Objective Data and Assessment of Progress/Goals.       PT End of Session - 10/26/19 1017    Visit Number  9    Date for PT Re-Evaluation  11/16/19    Authorization Type  aetna medicare/ medicaid    Progress Note Due on Visit  19    PT Start Time  1017    PT Stop Time  1056    PT Time Calculation (min)  39 min    Activity Tolerance  Patient tolerated treatment well    Behavior During Therapy  [facility] for tasks assessed/performed       Past Medical History:  Diagnosis Date  . Anemia   . Anxiety   . G6PD deficiency   . ILD (interstitial lung disease) ([place])   . Lupus ([place])   . Rheumatoid arthritis [facility])     Past Surgical History:  Procedure Laterality Date  . ABDOMINAL HYSTERECTOMY    . APPENDECTOMY  1980  . cardiac catherization  08/20/2017  . CARDIAC CATHETERIZATION N/A 05/10/2015   Procedure: Right Heart Cath;  Surgeon: Larey Dresser, MD;  Location: [place] CV LAB;  Service: Cardiovascular;  Laterality: N/A;  . CESAREAN SECTION  '95, '02, '07   X 3  . CHOLECYSTECTOMY  2010  . LAPAROSCOPIC HYSTERECTOMY  10/2015   have ovaries  . RIGHT HEART CATH N/A 08/20/2017   Procedure: RIGHT HEART CATH;  Surgeon: Larey Dresser, MD;  Location: [place] CV LAB;  Service: Cardiovascular;  Laterality: N/A;  . TOTAL KNEE ARTHROPLASTY Left 04/04/2019   Procedure: TOTAL KNEE ARTHROPLASTY;  Surgeon: Gaynelle Arabian, MD;  Location: [facility];  Service: Orthopedics;  Laterality: Left;  86mn  . TUBAL LIGATION      There were no vitals filed for this visit.  Subjective  Assessment - 10/26/19 1017    Subjective  My walking distance has improved. Sitting has not improved at all.    Pertinent History  RA, OA, interstitial lung disease, anxiety, anemia, left knee TKA 04/05/19, Right knee to be replaced, depression    Patient Stated Goals  be able to walk without stiffness, sit longer comfortably    Currently in Pain?  Yes    Pain Score  3     Pain Location  Back    Pain Orientation  Left    Pain Descriptors / Indicators  Dull    Pain Type  Chronic pain         [facility] PT Assessment - 10/26/19 0001      Observation/Other Assessments   Focus on Therapeutic Outcomes (FOTO)   46% limited      AROM   AROM Assessment Site  Lumbar    Lumbar Flexion  hands to shins    Lumbar Extension  WFL    Lumbar - Right Rotation  full    Lumbar - Left Rotation  75%                   [facility] Adult PT Treatment/Exercise - 10/26/19 0001      Lumbar Exercises: Aerobic   Nustep  L3 x 9  min with PTA present to discuss her pain.       Lumbar Exercises: Standing   Other Standing Lumbar Exercises  lumbar flex/ext x 10    Other Standing Lumbar Exercises  single leg dead lift x 5 then with 5# right hand and toe touch let      Lumbar Exercises: Seated   Other Seated Lumbar Exercises  dynadisc lumbar flex/ext x 10; side to side x 10      Lumbar Exercises: Supine   Bent Knee Raise  10 reps    Bridge  10 reps    Bridge Limitations  with lowering verterbra by vertebra to increase mobility                PT Short Term Goals - 10/26/19 1306      PT SHORT TERM GOAL #1   Title  Ind with inital HEP for lumbar and hip flexibility.    Status  Achieved      PT SHORT TERM GOAL #2   Title  Patient able to demonstrate correct body mechanics for bending and lifting to prevent further injury.    Baseline  able to perform modified mechanics to incorporate knee pain.    Status  Achieved      PT SHORT TERM GOAL #3   Title  Patient to demo improved standing posture  with neutral trunk and equal WB to decrease strain on low back.    Baseline  patient demonstrates improved posture from eval, but does not maintain neutral spine due to pain. She still WB's through [place].    Status  Not Met        PT Long Term Goals - 10/26/19 1020      PT LONG TERM GOAL #1   Title  Patient to report decreased pain in the low back with ADLs by 70% or more.    Baseline  45% improvement      PT LONG TERM GOAL #2   Title  Patient able to climb stairs without an increase in back pain.    Status  On-going      PT LONG TERM GOAL #3   Title  Patient to report decreased stiffness with waking by 70% or more.    Baseline  50% decrease    Status  On-going      PT LONG TERM GOAL #4   Title  Patient to report increased sitting tolerance to 1 hour.    Baseline  15-20 min    Status  On-going            Plan - 10/26/19 1310    Clinical Impression Statement  Patient is progressing toward some LTGs, but has not made any improvements with sitting tolerance. Her FOTO score has improved from 52% limitations to 46%. She continues to have difficulty moving her lower lumbar spine independently, but we made somewhat of a breakthrough today using the dyna disc. Pt was able to perfom in standing against the wall as well. She is doing well with aquatic therapy and will benefit from continued land PT as well to reach unmet goals. She is having a knee replacement 11/14/19.    Comorbidities  RA, OA, interstitial lung disease, anxiety, anemia, left knee TKA 04/05/19, Right knee to be replaced, depression    PT Frequency  2x / week    PT Duration  3 weeks    PT Treatment/Interventions  ADLs/Self Care Home Management;Cryotherapy;Therapist, sports;Therapeutic activities;Therapeutic exercise;Neuromuscular re-education;Patient/family education;Manual  techniques;Dry needling;Taping;Spinal Manipulations;Joint Manipulations    PT Next Visit Plan   continue 2x/wk until TKA.    PT Home Exercise Plan  [id number]; supine "reverse C" stretch for QL       Patient will benefit from skilled therapeutic intervention in order to improve the following deficits and impairments:  Abnormal gait, Decreased range of motion, Pain, Increased muscle spasms, Impaired flexibility, Postural dysfunction, Decreased strength  Visit Diagnosis: Chronic bilateral low back pain without sciatica - Plan: PT plan of care cert/re-cert  Muscle weakness (generalized) - Plan: PT plan of care cert/re-cert  Cramp and spasm - Plan: PT plan of care cert/re-cert  Abnormal posture - Plan: PT plan of care cert/re-cert     Problem List Patient Active Problem List   Diagnosis Date Noted  . OA (osteoarthritis) of knee 04/04/2019  . Acute bronchitis 02/13/2017  . Cough variant asthma  vs uacs  12/17/2016  . Morbid obesity due to excess calories ([place]) 12/17/2016  . Contracture of left elbow 11/21/2016  . Primary osteoarthritis of both knees 11/21/2016  . Pain in right hand 09/09/2016  . Pain in left hand 09/09/2016  . History of osteoarthritis 09/09/2016  . Rheumatoid factor positive 08/01/2016  . High risk medication use 01/16/2015  . G6PD deficiency 10/31/2014  . Dyspnea 08/23/2014  . ILD (interstitial lung disease) ([place]) 08/23/2014  . Other organ or system involvement in systemic lupus erythematosus ([place]) 08/23/2014    Madelyn Flavors PT 10/26/2019, 1:25 PM  [facility] [address] [place], [place], [address] Phone: [phone]   Fax:  [phone]  Name: ALIZABETH ANTONIO MRN: [id number] Date of Birth: 08-20-1970

## 2019-11-02 ENCOUNTER — Encounter: Payer: Self-pay | Admitting: Physical Therapy

## 2019-11-02 ENCOUNTER — Other Ambulatory Visit: Payer: Self-pay

## 2019-11-02 ENCOUNTER — Ambulatory Visit: Payer: Medicare HMO | Admitting: Physical Therapy

## 2019-11-02 DIAGNOSIS — G8929 Other chronic pain: Secondary | ICD-10-CM | POA: Diagnosis not present

## 2019-11-02 DIAGNOSIS — R293 Abnormal posture: Secondary | ICD-10-CM

## 2019-11-02 DIAGNOSIS — M6281 Muscle weakness (generalized): Secondary | ICD-10-CM

## 2019-11-02 DIAGNOSIS — M545 Low back pain, unspecified: Secondary | ICD-10-CM

## 2019-11-02 DIAGNOSIS — R252 Cramp and spasm: Secondary | ICD-10-CM | POA: Diagnosis not present

## 2019-11-02 NOTE — Therapy (Signed)
[facility] [address] [place], [place], [address] Phone: [phone]   Fax:  [phone]  Physical Therapy Treatment  Patient Details  Name: Shannon [name] MRN: [id number] Date of Birth: 06-Jul-1970 Referring Provider (PT): Anne Shutter MD   Encounter Date: 11/02/2019  PT End of Session - 11/02/19 1232    Visit Number  10    Date for PT Re-Evaluation  11/16/19    Authorization Type  aetna medicare/ medicaid    Progress Note Due on Visit  13    PT Start Time  1232    PT Stop Time  1318    PT Time Calculation (min)  46 min    Activity Tolerance  Patient tolerated treatment well    Behavior During Therapy  [facility] for tasks assessed/performed       Past Medical History:  Diagnosis Date  . Anemia   . Anxiety   . G6PD deficiency   . ILD (interstitial lung disease) ([place])   . Lupus ([place])   . Rheumatoid arthritis [facility])     Past Surgical History:  Procedure Laterality Date  . ABDOMINAL HYSTERECTOMY    . APPENDECTOMY  1980  . cardiac catherization  08/20/2017  . CARDIAC CATHETERIZATION N/A 05/10/2015   Procedure: Right Heart Cath;  Surgeon: Larey Dresser, MD;  Location: [facility];  Service: Cardiovascular;  Laterality: N/A;  . CESAREAN SECTION  '95, '02, '07   X 3  . CHOLECYSTECTOMY  2010  . LAPAROSCOPIC HYSTERECTOMY  10/2015   have ovaries  . RIGHT HEART CATH N/A 08/20/2017   Procedure: RIGHT HEART CATH;  Surgeon: Larey Dresser, MD;  Location: [facility];  Service: Cardiovascular;  Laterality: N/A;  . TOTAL KNEE ARTHROPLASTY Left 04/04/2019   Procedure: TOTAL KNEE ARTHROPLASTY;  Surgeon: Gaynelle Arabian, MD;  Location: [facility];  Service: Orthopedics;  Laterality: Left;  75mn  . TUBAL LIGATION      There were no vitals filed for this visit.  Subjective Assessment - 11/02/19 1233    Subjective  I started having sharp pains yesterday and it went down to knee. Nothing has helped decrease the  pain. Has tried ice but not positioning.    Pertinent History  RA, OA, interstitial lung disease, anxiety, anemia, left knee TKA 04/05/19, Right knee to be replaced, depression    Patient Stated Goals  be able to walk without stiffness, sit longer comfortably    Currently in Pain?  Yes    Pain Score  7     Pain Location  Back    Pain Orientation  Left    Pain Descriptors / Indicators  SPatsi SearsAdult PT Treatment/Exercise - 11/02/19 0001      Self-Care   Other Self-Care Comments   Discussed MFR using fist or other objects as pt says ball rolls out too easily; PT demonstrated multiple options.      Lumbar Exercises: Aerobic   Nustep  L2 x 5 min due to increased pain      Modalities   Modalities  Electrical Stimulation;Moist Heat      Moist Heat Therapy   Number Minutes Moist Heat  15 Minutes    Moist Heat Location  Lumbar Spine      Electrical Stimulation   Electrical Stimulation Location  left lumbar gluteals  Electrical Stimulation Action  IFC    Electrical Stimulation Parameters  x 15 min to tolerance    Electrical Stimulation Goals  Pain      Manual Therapy   Manual Therapy  Soft tissue mobilization;Myofascial release    Manual therapy comments  SDLY x 2 min abolishes LE pain    Soft tissue mobilization  to left lumbar, QL and gluteals    Myofascial Release  TPR to left gluteals and left QL             PT Education - 11/02/19 1351    Education Details  TENS info    Person(s) Educated  Patient    Methods  Explanation;Handout    Comprehension  Verbalized understanding       PT Short Term Goals - 10/26/19 1306      PT SHORT TERM GOAL #1   Title  Ind with inital HEP for lumbar and hip flexibility.    Status  Achieved      PT SHORT TERM GOAL #2   Title  Patient able to demonstrate correct body mechanics for bending and lifting to prevent further injury.    Baseline  able to perform modified mechanics to incorporate  knee pain.    Status  Achieved      PT SHORT TERM GOAL #3   Title  Patient to demo improved standing posture with neutral trunk and equal WB to decrease strain on low back.    Baseline  patient demonstrates improved posture from eval, but does not maintain neutral spine due to pain. She still WB's through [place].    Status  Not Met        PT Long Term Goals - 10/26/19 1020      PT LONG TERM GOAL #1   Title  Patient to report decreased pain in the low back with ADLs by 70% or more.    Baseline  45% improvement      PT LONG TERM GOAL #2   Title  Patient able to climb stairs without an increase in back pain.    Status  On-going      PT LONG TERM GOAL #3   Title  Patient to report decreased stiffness with waking by 70% or more.    Baseline  50% decrease    Status  On-going      PT LONG TERM GOAL #4   Title  Patient to report increased sitting tolerance to 1 hour.    Baseline  15-20 min    Status  On-going            Plan - 11/02/19 1351    Clinical Impression Statement  Patient presenting today with increased pain since yesterday. She had sharp pains in back that referred down left leg to knee. She was moving slower today with antalgic gait. She tolerated Nu step, but was too flared up for normal TE. A trial of estim and heat was applied to her low back and left gluteals. She tolerated STW well afterwards. TENS info was issued.    Comorbidities  RA, OA, interstitial lung disease, anxiety, anemia, left knee TKA 04/05/19, Right knee to be replaced, depression    PT Frequency  2x / week    PT Duration  3 weeks    PT Treatment/Interventions  ADLs/Self Care Home Management;Cryotherapy;Therapist, sports;Therapeutic activities;Therapeutic exercise;Neuromuscular re-education;Patient/family education;Manual techniques;Dry needling;Taping;Spinal Manipulations;Joint Manipulations    PT Next Visit Plan  assess response to modalities and STW;  return to lumbar ROM and strengthening as tolerated.    PT Home Exercise Plan  [id number]; supine "reverse C" stretch for QL    Consulted and Agree with Plan of Care  Patient       Patient will benefit from skilled therapeutic intervention in order to improve the following deficits and impairments:  Abnormal gait, Decreased range of motion, Pain, Increased muscle spasms, Impaired flexibility, Postural dysfunction, Decreased strength  Visit Diagnosis: Chronic bilateral low back pain without sciatica  Cramp and spasm  Muscle weakness (generalized)  Abnormal posture     Problem List Patient Active Problem List   Diagnosis Date Noted  . OA (osteoarthritis) of knee 04/04/2019  . Acute bronchitis 02/13/2017  . Cough variant asthma  vs uacs  12/17/2016  . Morbid obesity due to excess calories ([place]) 12/17/2016  . Contracture of left elbow 11/21/2016  . Primary osteoarthritis of both knees 11/21/2016  . Pain in right hand 09/09/2016  . Pain in left hand 09/09/2016  . History of osteoarthritis 09/09/2016  . Rheumatoid factor positive 08/01/2016  . High risk medication use 01/16/2015  . G6PD deficiency 10/31/2014  . Dyspnea 08/23/2014  . ILD (interstitial lung disease) ([place]) 08/23/2014  . Other organ or system involvement in systemic lupus erythematosus ([place]) 08/23/2014    Madelyn Flavors PT 11/02/2019, 4:05 PM  [facility] [address] [place], [place], [address] Phone: [phone]   Fax:  [phone]  Name: EMANUELA RUNNION MRN: [id number] Date of Birth: 1971-02-12

## 2019-11-04 NOTE — Patient Instructions (Addendum)
DUE TO COVID-19 ONLY ONE VISITOR IS ALLOWED TO COME WITH YOU AND STAY IN THE WAITING ROOM ONLY DURING PRE OP AND PROCEDURE DAY OF SURGERY. THE 2 VISITORS  MAY VISIT WITH YOU AFTER SURGERY IN YOUR PRIVATE ROOM DURING VISITING HOURS ONLY!  YOU NEED TO HAVE A COVID 19 TEST ON_5/20_____ @_8 :45____, THIS TEST MUST BE DONE BEFORE SURGERY, COME  [address], [place] , .  [facility]) ONCE YOUR COVID TEST IS COMPLETED, PLEASE BEGIN THE QUARANTINE INSTRUCTIONS AS OUTLINED IN YOUR HANDOUT.                Shannon [name]    Your procedure is scheduled on: 11/14/19   Report to [facility] Main  Entrance   Report to admitting at   9:05 AM     Call this number if you have problems the morning of surgery [phone]    BRUSH YOUR TEETH MORNING OF SURGERY AND RINSE YOUR MOUTH OUT, NO CHEWING GUM CANDY OR MINTS.  Do not eat food After Midnight.   YOU MAY HAVE CLEAR LIQUIDS FROM MIDNIGHT UNTIL 8:30 AM.   At 8:30 AM Please finish the prescribed Pre-Surgery  drink.   Nothing by mouth after you finish the  drink !    Take these medicines the morning of surgery with A SIP OF WATER: Oxycontin, Seroquel, Buspirone, Wellbutrin, Zoloft.  Use your inhalers and bring them with you to the hospital                                 You may not have any metal on your body including hair pins and              piercings  Do not wear jewelry, make-up, lotions, powders or perfumes, deodorant             Do not wear nail polish on your fingernails.  Do not shave  48 hours prior to surgery.            Do not bring valuables to the hospital. [facility] IS NOT             RESPONSIBLE   FOR VALUABLES.  Contacts, dentures or bridgework may not be worn into surgery.                  Please read over the following fact sheets you were given: _____________________________________________________________________             [facility] - Preparing for Surgery Before  surgery, you can play an important role.   Because skin is not sterile, your skin needs to be as free of germs as possible .  You can reduce the number of germs on your skin by washing with CHG (chlorahexidine gluconate) soap before surgery.   CHG is an antiseptic cleaner which kills germs and bonds with the skin to continue killing germs even after washing. Please DO NOT use if you have an allergy to CHG or antibacterial soaps.   If your skin becomes reddened/irritated stop using the CHG and inform your nurse when you arrive at Short Stay. Do not shave (including legs and underarms) for at least 48 hours prior to the first CHG shower.   Please follow these instructions carefully:  1.  Shower with CHG Soap the night before surgery and the  morning of Surgery.  2.  If you choose  to wash your hair, wash your hair first as usual with your  normal  shampoo.  3.  After you shampoo, rinse your hair and body thoroughly to remove the  shampoo.                                        4.  Use CHG as you would any other liquid soap.  You can apply chg directly  to the skin and wash                       Gently with a scrungie or clean washcloth.  5.  Apply the CHG Soap to your body ONLY FROM THE NECK DOWN.   Do not use on face/ open                           Wound or open sores. Avoid contact with eyes, ears mouth and genitals (private parts).                       Wash face,  Genitals (private parts) with your normal soap.             6.  Wash thoroughly, paying special attention to the area where your surgery  will be performed.  7.  Thoroughly rinse your body with warm water from the neck down.  8.  DO NOT shower/wash with your normal soap after using and rinsing off  the CHG Soap.             9.  Pat yourself dry with a clean towel.            10.  Wear clean pajamas.            11.  Place clean sheets on your bed the night of your first shower and do not  sleep with pets. Day of Surgery : Do not  apply any lotions/deodorants the morning of surgery.  Please wear clean clothes to the hospital/surgery center.  FAILURE TO FOLLOW THESE INSTRUCTIONS MAY RESULT IN THE CANCELLATION OF YOUR SURGERY PATIENT SIGNATURE_________________________________  NURSE SIGNATURE__________________________________  ________________________________________________________________________   [name]  An incentive spirometer is a tool that can help keep your lungs clear and active. This tool measures how well you are filling your lungs with each breath. Taking long deep breaths may help reverse or decrease the chance of developing breathing (pulmonary) problems (especially infection) following:  A long period of time when you are unable to move or be active. BEFORE THE PROCEDURE   If the spirometer includes an indicator to show your best effort, your nurse or respiratory therapist will set it to a desired goal.  If possible, sit up straight or lean slightly forward. Try not to slouch.  Hold the incentive spirometer in an upright position. INSTRUCTIONS FOR USE  1. Sit on the edge of your bed if possible, or sit up as far as you can in bed or on a chair. 2. Hold the incentive spirometer in an upright position. 3. Breathe out normally. 4. Place the mouthpiece in your mouth and seal your lips tightly around it. 5. Breathe in slowly and as deeply as possible, raising the piston or the ball toward the top of the column. 6. Hold your breath for 3-5 seconds or for as long as  possible. Allow the piston or ball to fall to the bottom of the column. 7. Remove the mouthpiece from your mouth and breathe out normally. 8. Rest for a few seconds and repeat Steps 1 through 7 at least 10 times every 1-2 hours when you are awake. Take your time and take a few normal breaths between deep breaths. 9. The spirometer may include an indicator to show your best effort. Use the indicator as a goal to work toward during  each repetition. 10. After each set of 10 deep breaths, practice coughing to be sure your lungs are clear. If you have an incision (the cut made at the time of surgery), support your incision when coughing by placing a pillow or rolled up towels firmly against it. Once you are able to get out of bed, walk around indoors and cough well. You may stop using the incentive spirometer when instructed by your caregiver.  RISKS AND COMPLICATIONS  Take your time so you do not get dizzy or light-headed.  If you are in pain, you may need to take or ask for pain medication before doing incentive spirometry. It is harder to take a deep breath if you are having pain. AFTER USE  Rest and breathe slowly and easily.  It can be helpful to keep track of a log of your progress. Your caregiver can provide you with a simple table to help with this. If you are using the spirometer at home, follow these instructions: SEEK MEDICAL CARE IF:   You are having difficultly using the spirometer.  You have trouble using the spirometer as often as instructed.  Your pain medication is not giving enough relief while using the spirometer.  You develop fever of 100.5 F (38.1 C) or higher. SEEK IMMEDIATE MEDICAL CARE IF:   You cough up bloody sputum that had not been present before.  You develop fever of 102 F (38.9 C) or greater.  You develop worsening pain at or near the incision site. MAKE SURE YOU:   Understand these instructions.  Will watch your condition.  Will get help right away if you are not doing well or get worse. Document Released: 10/20/2006 Document Revised: 09/01/2011 Document Reviewed: 12/21/2006 [facility] Patient Information 2014 [place], [place].   ________________________________________________________________________

## 2019-11-07 ENCOUNTER — Encounter (HOSPITAL_COMMUNITY)
Admission: RE | Admit: 2019-11-07 | Discharge: 2019-11-07 | Disposition: A | Discharge status: Home / Self Care | Payer: Medicare HMO | Source: Ambulatory Visit | Attending: Orthopedic Surgery | Admitting: Orthopedic Surgery

## 2019-11-07 ENCOUNTER — Other Ambulatory Visit: Payer: Self-pay

## 2019-11-07 ENCOUNTER — Encounter (HOSPITAL_COMMUNITY): Payer: Self-pay

## 2019-11-07 DIAGNOSIS — Z01812 Encounter for preprocedural laboratory examination: Secondary | ICD-10-CM | POA: Insufficient documentation

## 2019-11-07 HISTORY — DX: Dyspnea, unspecified: R06.00

## 2019-11-07 NOTE — Progress Notes (Signed)
PCP - Dr. Reece Agar, [name] Cardiologist - no  Chest x-ray - no EKG - on chart Stress Test - 09/28/19 ECHO - 2019 Cardiac Cath - 2016,2019  Sleep Study - yes CPAP - no  Fasting Blood Sugar - NA Checks Blood Sugar _____ times a day  Blood Thinner Instructions:NA Aspirin Instructions: Last Dose:  Anesthesia review:   Patient denies shortness of breath, fever, cough and chest pain at PAT appointment  yes Patient verbalized understanding of instructions that were given to them at the PAT appointment. Patient was also instructed that they will need to review over the PAT instructions again at home before surgery. yes

## 2019-11-08 ENCOUNTER — Encounter (HOSPITAL_COMMUNITY)
Admission: RE | Admit: 2019-11-08 | Discharge: 2019-11-08 | Disposition: A | Discharge status: Home / Self Care | Payer: Medicare HMO | Source: Ambulatory Visit | Attending: Orthopedic Surgery | Admitting: Orthopedic Surgery

## 2019-11-08 ENCOUNTER — Encounter (HOSPITAL_COMMUNITY): Payer: Self-pay | Admitting: Physician Assistant

## 2019-11-08 DIAGNOSIS — Z01812 Encounter for preprocedural laboratory examination: Secondary | ICD-10-CM | POA: Insufficient documentation

## 2019-11-08 LAB — COMPREHENSIVE METABOLIC PANEL
ALT: 16 U/L (ref 0–44)
AST: 16 U/L (ref 15–41)
Albumin: 3.9 g/dL (ref 3.5–5.0)
Alkaline Phosphatase: 80 U/L (ref 38–126)
Anion gap: 11 (ref 5–15)
BUN: 16 mg/dL (ref 6–20)
CO2: 25 mmol/L (ref 22–32)
Calcium: 8.9 mg/dL (ref 8.9–10.3)
Chloride: 106 mmol/L (ref 98–111)
Creatinine, Ser: 0.89 mg/dL (ref 0.44–1.00)
GFR calc Af Amer: >60 mL/min (ref >60)
GFR calc non Af Amer: >60 mL/min (ref >60)
Glucose, Bld: 85 mg/dL (ref 70–99)
Potassium: 3.4 mmol/L — ABNORMAL LOW (ref 3.5–5.1)
Sodium: 142 mmol/L (ref 135–145)
Total Bilirubin: 0.4 mg/dL (ref 0.3–1.2)
Total Protein: 7.6 g/dL (ref 6.5–8.1)

## 2019-11-08 LAB — SURGICAL PCR SCREEN
MRSA, PCR: NEGATIVE
Staphylococcus aureus: NEGATIVE

## 2019-11-08 LAB — CBC
HCT: 35.2 % — ABNORMAL LOW (ref 36.0–46.0)
Hemoglobin: 10.9 g/dL — ABNORMAL LOW (ref 12.0–15.0)
MCH: 29.9 pg (ref 26.0–34.0)
MCHC: 31 g/dL (ref 30.0–36.0)
MCV: 96.4 fL (ref 80.0–100.0)
Platelets: 227 10*3/uL (ref 150–400)
RBC: 3.65 MIL/uL — ABNORMAL LOW (ref 3.87–5.11)
RDW: 13.6 % (ref 11.5–15.5)
WBC: 4.9 10*3/uL (ref 4.0–10.5)
nRBC: 0 % (ref 0.0–0.2)

## 2019-11-08 LAB — PROTIME-INR
INR: 1 (ref 0.8–1.2)
Prothrombin Time: 13.1 seconds (ref 11.4–15.2)

## 2019-11-08 LAB — APTT: aPTT: 28 seconds (ref 24–36)

## 2019-11-08 NOTE — Progress Notes (Signed)
Lab results: K: 3.4 Hemoglobin: 10.9

## 2019-11-09 ENCOUNTER — Other Ambulatory Visit: Payer: Medicare HMO

## 2019-11-10 ENCOUNTER — Other Ambulatory Visit (HOSPITAL_COMMUNITY): Payer: Medicare HMO

## 2019-11-11 DIAGNOSIS — M329 Systemic lupus erythematosus, unspecified: Secondary | ICD-10-CM | POA: Diagnosis not present

## 2019-11-11 DIAGNOSIS — G894 Chronic pain syndrome: Secondary | ICD-10-CM | POA: Diagnosis not present

## 2019-11-11 DIAGNOSIS — Z96652 Presence of left artificial knee joint: Secondary | ICD-10-CM | POA: Diagnosis not present

## 2019-11-11 DIAGNOSIS — M25561 Pain in right knee: Secondary | ICD-10-CM | POA: Diagnosis not present

## 2019-11-11 DIAGNOSIS — Z79891 Long term (current) use of opiate analgesic: Secondary | ICD-10-CM | POA: Diagnosis not present

## 2019-11-11 DIAGNOSIS — M545 Low back pain: Secondary | ICD-10-CM | POA: Diagnosis not present

## 2019-11-14 ENCOUNTER — Ambulatory Visit (HOSPITAL_COMMUNITY): Admission: RE | Admit: 2019-11-14 | Payer: Medicare HMO | Source: Home / Self Care | Admitting: Orthopedic Surgery

## 2019-11-14 ENCOUNTER — Encounter (HOSPITAL_COMMUNITY): Admission: RE | Payer: Self-pay | Source: Home / Self Care

## 2019-11-14 DIAGNOSIS — I10 Essential (primary) hypertension: Secondary | ICD-10-CM | POA: Diagnosis not present

## 2019-11-14 DIAGNOSIS — Z6841 Body Mass Index (BMI) 40.0 and over, adult: Secondary | ICD-10-CM | POA: Diagnosis not present

## 2019-11-14 LAB — TYPE AND SCREEN
ABO/RH(D): A POS
Antibody Screen: NEGATIVE

## 2019-11-14 SURGERY — ARTHROPLASTY, KNEE, TOTAL
Anesthesia: Choice | Site: Knee | Laterality: Right

## 2019-11-18 ENCOUNTER — Other Ambulatory Visit: Payer: Self-pay

## 2019-11-18 ENCOUNTER — Ambulatory Visit: Payer: Medicare HMO | Admitting: Physical Therapy

## 2019-11-18 ENCOUNTER — Encounter: Payer: Self-pay | Admitting: Physical Therapy

## 2019-11-18 DIAGNOSIS — R252 Cramp and spasm: Secondary | ICD-10-CM | POA: Diagnosis not present

## 2019-11-18 DIAGNOSIS — M545 Low back pain: Secondary | ICD-10-CM | POA: Diagnosis not present

## 2019-11-18 DIAGNOSIS — G8929 Other chronic pain: Secondary | ICD-10-CM

## 2019-11-18 DIAGNOSIS — R293 Abnormal posture: Secondary | ICD-10-CM | POA: Diagnosis not present

## 2019-11-18 DIAGNOSIS — M6281 Muscle weakness (generalized): Secondary | ICD-10-CM

## 2019-11-18 NOTE — Therapy (Signed)
[facility] [address], [place], [place], [address] Phone: [phone]   Fax:  [phone]  Physical Therapy Treatment  Patient Details  Name: Shannon [name] MRN: [id number] Date of Birth: [birth year] Referring Provider (PT): Anne Shutter MD   Encounter Date: 11/18/2019  PT End of Session - 11/18/19 0943    Visit Number  11    Date for PT Re-Evaluation  12/15/19    Authorization Type  aetna medicare/ medicaid    Progress Note Due on Visit  19    PT Start Time  0930    PT Stop Time  1040    PT Time Calculation (min)  70 min    Activity Tolerance  Patient tolerated treatment well    Behavior During Therapy  [facility] for tasks assessed/performed       Past Medical History:  Diagnosis Date  . Anemia   . Anxiety   . Dyspnea   . G6PD deficiency   . ILD (interstitial lung disease) ([place])   . Lupus ([facility])   . Rheumatoid arthritis [facility])     Past Surgical History:  Procedure Laterality Date  . ABDOMINAL HYSTERECTOMY    . APPENDECTOMY  1980  . cardiac catherization  08/20/2017  . CARDIAC CATHETERIZATION N/A 05/10/2015   Procedure: Right Heart Cath;  Surgeon: Larey Dresser, MD;  Location: [place] [facility];  Service: Cardiovascular;  Laterality: N/A;  . CESAREAN SECTION  '95, '02, '07   X 3  . CHOLECYSTECTOMY  2010  . LAPAROSCOPIC HYSTERECTOMY  10/2015   have ovaries  . RIGHT HEART CATH N/A 08/20/2017   Procedure: RIGHT HEART CATH;  Surgeon: Larey Dresser, MD;  Location: [place] [facility];  Service: Cardiovascular;  Laterality: N/A;  . TOTAL KNEE ARTHROPLASTY Left 04/04/2019   Procedure: TOTAL KNEE ARTHROPLASTY;  Surgeon: Gaynelle Arabian, MD;  Location: [facility];  Service: Orthopedics;  Laterality: Left;  64min  . TUBAL LIGATION      There were no vitals filed for this visit.  Subjective Assessment - 11/18/19 0934    Subjective  I was suppose to have the TKR on 5/24 but due to blood pressure and needing to lose  5# it is on 6/28. My back is hurting me so much in the past week. I put more weight on the left knee due to right knee is hurting.    Pertinent History  RA, OA, interstitial lung disease, anxiety, anemia, left knee TKA 04/05/19, Right knee to be replaced, depression    How long can you sit comfortably?  15-20 min depending on chair (recliner best); straight back painful    Diagnostic tests  none    Patient Stated Goals  be able to walk without stiffness, sit longer comfortably    Currently in Pain?  Yes    Pain Score  7     Pain Location  Back    Pain Orientation  Left    Pain Descriptors / Indicators  Sharp    Pain Type  Chronic pain    Pain Radiating Towards  radiates into left buttocks into her left hip    Pain Onset  More than a month ago    Pain Frequency  Intermittent    Aggravating Factors   sit to long or stand too long    Pain Relieving Factors  rest    Multiple Pain Sites  No         [facility] PT Assessment - 11/18/19  0001      Assessment   Medical Diagnosis  low back pain    Referring Provider (PT)  Dawayne Cirri MD    Onset Date/Surgical Date  10/22/18    Hand Dominance  Left    Next MD Visit  end of May    Prior Therapy  yes      Precautions   Precautions  Other (comment)    Precaution Comments  Sulfa Allergy - no ionto      Restrictions   Weight Bearing Restrictions  No      Home Environment   Living Environment  Private residence    Living Arrangements  Spouse/significant other    Type of Home  House      Prior Function   Level of Independence  Independent    Vocation  Works at home    Leisure  be able to exercise more      Observation/Other Assessments   Focus on Therapeutic Outcomes (FOTO)   46% limited      Posture/Postural Control   Posture/Postural Control  Postural limitations    Posture Comments  stands in 10 deg lumbar flexion, WBing through RLE; left knee in flexion; decreased lordotic curve in lumbar spine      ROM / Strength   AROM / PROM /  Strength  AROM;PROM;Strength      AROM   Lumbar Flexion  hands to shins    Lumbar Extension  WFL    Lumbar - Right Rotation  full    Lumbar - Left Rotation  25% limited      Strength   Overall Strength Comments  grossly 5/5 in BLE except knee flex 4+/5                    [facility] Adult PT Treatment/Exercise - 11/18/19 0001      Lumbar Exercises: Aerobic   Nustep  L2 x 6 min whiel being assessed      Lumbar Exercises: Supine   Ab Set  5 reps;5 seconds    Other Supine Lumbar Exercises  supine with feet on ball and rock back and forth in small motion then knees to chest in small range      Modalities   Modalities  Electrical Stimulation;Moist Heat      Moist Heat Therapy   Number Minutes Moist Heat  20 Minutes    Moist Heat Location  Lumbar Spine      Electrical Stimulation   Electrical Stimulation Location  lumbar sacral area    Electrical Stimulation Action  IFC    Electrical Stimulation Parameters  20 min, to patient tolerance      Manual Therapy   Manual Therapy  Myofascial release;Soft tissue mobilization    Manual therapy comments  using the suction cup and guafasen tool to release the fascia along the lumbar sacral area    Soft tissue mobilization  soft tissue work to the lumbar praspinals, gluteals, quadratus    Myofascial Release  fascial release to the lumbar sacral area with distraction and cross distraction               PT Short Term Goals - 11/18/19 5638      PT SHORT TERM GOAL #3   Title  Patient to demo improved standing posture with neutral trunk and equal WB to decrease strain on low back.    Baseline  patient demonstrates improved posture from eval, but does not maintain neutral spine due to pain.  She still WB's through Rt LE.    Time  4    Period  Weeks    Status  On-going        PT Long Term Goals - 11/18/19 7017      PT LONG TERM GOAL #1   Title  Patient to report decreased pain in the low back with ADLs by 70% or more.     Baseline  right now is having a flare-up    Time  8    Period  Weeks    Status  On-going      PT LONG TERM GOAL #2   Title  Patient able to climb stairs without an increase in back pain.    Baseline  improved by 15%    Time  8    Period  Weeks    Status  On-going      PT LONG TERM GOAL #3   Title  Patient to report decreased stiffness with waking by 70% or more.    Baseline  50% decrease    Time  8    Period  Weeks    Status  On-going      PT LONG TERM GOAL #4   Title  Patient to report increased sitting tolerance to 1 hour.    Baseline  15-20 min    Time  8    Period  Weeks    Status  On-going            Plan - 11/18/19 1026    Clinical Impression Statement  Patient reports she is having a flare-up with her back pain at level 8/10 located in the left lumbar radiating into her buttocks and thigh. Patient has increased fascial tightness in her lumbar sacral area. After manual work the area was softer and duller due to imroved tissue mobility. Patietn reports she is able to sit 15-20 minutes. She is able to climb stairs with 15% greater ease. Her stiffness is 50% better. Patient is having her right TKR on 12/15/2019. Patient will benefit from skilled therapy to manage lumbar pain, improve tissue mobility and gentle strengthening.    Personal Factors and Comorbidities  Comorbidity 3+    Comorbidities  RA, OA, interstitial lung disease, anxiety, anemia, left knee TKA 04/05/19, Right knee to be replaced, depression    Examination-Activity Limitations  Bend;Stairs    Stability/Clinical Decision Making  Evolving/Moderate complexity    Rehab Potential  Good    PT Frequency  2x / week    PT Duration  4 weeks    PT Treatment/Interventions  ADLs/Self Care Home Management;Cryotherapy;Technical brewer;Therapeutic activities;Therapeutic exercise;Neuromuscular re-education;Patient/family education;Manual techniques;Dry needling;Taping;Spinal  Manipulations;Joint Manipulations    PT Next Visit Plan  continue with stim and heat at end of treatment, gentle spinal strengthening, work on tissue mobtility    PT Home Exercise Plan  [id number]; supine "reverse C" stretch for QL    Recommended Other Services  sent MD renewal on 11/18/2019    Consulted and Agree with Plan of Care  Patient       Patient will benefit from skilled therapeutic intervention in order to improve the following deficits and impairments:  Abnormal gait, Decreased range of motion, Pain, Increased muscle spasms, Impaired flexibility, Postural dysfunction, Decreased strength  Visit Diagnosis: Chronic bilateral low back pain without sciatica - Plan: PT plan of care cert/re-cert  Cramp and spasm - Plan: PT plan of care cert/re-cert  Muscle weakness (generalized) - Plan: PT plan of care  cert/re-cert  Abnormal posture - Plan: PT plan of care cert/re-cert     Problem List Patient Active Problem List   Diagnosis Date Noted  . OA (osteoarthritis) of knee 04/04/2019  . Acute bronchitis 02/13/2017  . Cough variant asthma  vs uacs  12/17/2016  . Morbid obesity due to excess calories ([facility]) 12/17/2016  . Contracture of left elbow 11/21/2016  . Primary osteoarthritis of both knees 11/21/2016  . Pain in right hand 09/09/2016  . Pain in left hand 09/09/2016  . History of osteoarthritis 09/09/2016  . Rheumatoid factor positive 08/01/2016  . High risk medication use 01/16/2015  . G6PD deficiency 10/31/2014  . Dyspnea 08/23/2014  . ILD (interstitial lung disease) ([facility]) 08/23/2014  . Other organ or system involvement in systemic lupus erythematosus ([facility]) 08/23/2014    Eulis Foster, PT 11/18/19 10:33 AM   [facility] [address], [address] [place], [place], [address] Phone: [phone]   Fax:  [phone]  Name: Shannon [name] MRN: [id number] Date of Birth: 01-01-1971

## 2019-11-19 ENCOUNTER — Other Ambulatory Visit (HOSPITAL_COMMUNITY)
Admission: RE | Admit: 2019-11-19 | Discharge: 2019-11-19 | Disposition: A | Discharge status: Home / Self Care | Payer: Medicare HMO | Source: Ambulatory Visit | Attending: Internal Medicine | Admitting: Internal Medicine

## 2019-11-19 DIAGNOSIS — Z01812 Encounter for preprocedural laboratory examination: Secondary | ICD-10-CM | POA: Insufficient documentation

## 2019-11-19 DIAGNOSIS — Z20822 Contact with and (suspected) exposure to covid-19: Secondary | ICD-10-CM | POA: Insufficient documentation

## 2019-11-19 LAB — SARS CORONAVIRUS 2 (TAT 6-24 HRS): SARS Coronavirus 2: NEGATIVE

## 2019-11-23 ENCOUNTER — Ambulatory Visit: Payer: Medicare HMO | Admitting: Internal Medicine

## 2019-11-23 ENCOUNTER — Other Ambulatory Visit: Payer: Self-pay | Admitting: Internal Medicine

## 2019-11-23 DIAGNOSIS — Z1231 Encounter for screening mammogram for malignant neoplasm of breast: Secondary | ICD-10-CM | POA: Diagnosis not present

## 2019-11-23 DIAGNOSIS — J849 Interstitial pulmonary disease, unspecified: Secondary | ICD-10-CM

## 2019-11-24 ENCOUNTER — Other Ambulatory Visit: Payer: Self-pay

## 2019-11-24 ENCOUNTER — Ambulatory Visit (INDEPENDENT_AMBULATORY_CARE_PROVIDER_SITE_OTHER): Payer: Medicare HMO | Admitting: Internal Medicine

## 2019-11-24 ENCOUNTER — Ambulatory Visit: Payer: Medicare HMO | Admitting: Internal Medicine

## 2019-11-24 DIAGNOSIS — J849 Interstitial pulmonary disease, unspecified: Secondary | ICD-10-CM

## 2019-11-24 LAB — PULMONARY FUNCTION TEST
DL/VA % pred: 122 %
DL/VA: 5.14 ml/min/mmHg/L
DLCO cor % pred: 79 %
DLCO cor: 20.32 ml/min/mmHg
DLCO unc % pred: 72 %
DLCO unc: 18.56 ml/min/mmHg
FEF 25-75 Pre: 2.58 L/sec
FEF2575-%Pred-Pre: 85 %
FEV1-%Pred-Pre: 75 %
FEV1-Pre: 2.21 L
FEV1FVC-%Pred-Pre: 100 %
FEV6-%Pred-Pre: 75 %
FEV6-Pre: 2.67 L
FEV6FVC-%Pred-Pre: 102 %
FVC-%Pred-Pre: 73 %
FVC-Pre: 2.67 L
Pre FEV1/FVC ratio: 83 %
Pre FEV6/FVC Ratio: 100 %

## 2019-11-24 NOTE — Progress Notes (Signed)
Spirometry and Dlco done today. 

## 2019-11-25 ENCOUNTER — Telehealth: Payer: Self-pay | Admitting: Physical Therapy

## 2019-11-25 ENCOUNTER — Ambulatory Visit: Payer: Medicare HMO | Attending: Rheumatology | Admitting: Physical Therapy

## 2019-11-25 ENCOUNTER — Telehealth: Payer: Self-pay | Admitting: Internal Medicine

## 2019-11-25 DIAGNOSIS — R252 Cramp and spasm: Secondary | ICD-10-CM | POA: Insufficient documentation

## 2019-11-25 DIAGNOSIS — M545 Low back pain: Secondary | ICD-10-CM | POA: Insufficient documentation

## 2019-11-25 DIAGNOSIS — R293 Abnormal posture: Secondary | ICD-10-CM | POA: Insufficient documentation

## 2019-11-25 DIAGNOSIS — G8929 Other chronic pain: Secondary | ICD-10-CM | POA: Insufficient documentation

## 2019-11-25 DIAGNOSIS — M6281 Muscle weakness (generalized): Secondary | ICD-10-CM | POA: Insufficient documentation

## 2019-11-25 NOTE — Progress Notes (Signed)
Left detailed vm requesting a return call from patient to get her scheduled to see MR in July in a 30 min. Slot in the ILD clinic on Tuesday or Thursday.

## 2019-11-25 NOTE — Telephone Encounter (Signed)
PTA called pt for no show. Left message on VM to call our clinic. Ane [employer], PTA @TODAY @ 8:23 AM

## 2019-11-25 NOTE — Telephone Encounter (Signed)
Detailed message left for patient to return our call to get her scheduled to see MR in July in a 30 minute slot in the ILD clinic on Tuesday or Thursday.

## 2019-11-25 NOTE — Progress Notes (Signed)
PFT stable/beter. Good news  Plan  - July 2021 ov - 30 min slot for ILD

## 2019-11-30 DIAGNOSIS — Z7189 Other specified counseling: Secondary | ICD-10-CM | POA: Diagnosis not present

## 2019-11-30 DIAGNOSIS — M3213 Lung involvement in systemic lupus erythematosus: Secondary | ICD-10-CM | POA: Diagnosis not present

## 2019-11-30 DIAGNOSIS — J849 Interstitial pulmonary disease, unspecified: Secondary | ICD-10-CM | POA: Diagnosis not present

## 2019-11-30 DIAGNOSIS — R413 Other amnesia: Secondary | ICD-10-CM | POA: Diagnosis not present

## 2019-12-01 ENCOUNTER — Other Ambulatory Visit: Payer: Self-pay

## 2019-12-01 ENCOUNTER — Ambulatory Visit: Payer: Medicare HMO | Admitting: Physical Therapy

## 2019-12-01 DIAGNOSIS — M6281 Muscle weakness (generalized): Secondary | ICD-10-CM

## 2019-12-01 DIAGNOSIS — R252 Cramp and spasm: Secondary | ICD-10-CM | POA: Diagnosis not present

## 2019-12-01 DIAGNOSIS — G8929 Other chronic pain: Secondary | ICD-10-CM | POA: Diagnosis not present

## 2019-12-01 DIAGNOSIS — M545 Low back pain, unspecified: Secondary | ICD-10-CM

## 2019-12-01 DIAGNOSIS — R293 Abnormal posture: Secondary | ICD-10-CM

## 2019-12-01 DIAGNOSIS — R69 Illness, unspecified: Secondary | ICD-10-CM | POA: Diagnosis not present

## 2019-12-01 DIAGNOSIS — F411 Generalized anxiety disorder: Secondary | ICD-10-CM | POA: Diagnosis not present

## 2019-12-01 NOTE — Therapy (Addendum)
[facility] [address], [name], [place], [address] Phone: [phone]   Fax:  [phone]  Physical Therapy Treatment  Patient Details  Name: Shannon [name] MRN: [id number] Date of Birth: 1970/09/30 Referring Provider (PT): Anne Shutter MD   Encounter Date: 12/01/2019   PT End of Session - 12/01/19 1304    Visit Number 12    Date for PT Re-Evaluation 12/15/19    Authorization Type aetna medicare/ medicaid    PT Start Time 1230    PT Stop Time 1315    PT Time Calculation (min) 45 min    Activity Tolerance Patient tolerated treatment well    Behavior During Therapy [facility] for tasks assessed/performed           Past Medical History:  Diagnosis Date  . Anemia   . Anxiety   . Dyspnea   . G6PD deficiency   . ILD (interstitial lung disease) ([place])   . Lupus ([place])   . Rheumatoid arthritis [facility])     Past Surgical History:  Procedure Laterality Date  . ABDOMINAL HYSTERECTOMY    . APPENDECTOMY  1980  . cardiac catherization  08/20/2017  . CARDIAC CATHETERIZATION N/A 05/10/2015   Procedure: Right Heart Cath;  Surgeon: Larey Dresser, MD;  Location: [place] [facility];  Service: Cardiovascular;  Laterality: N/A;  . CESAREAN SECTION  '95, '02, '07   X 3  . CHOLECYSTECTOMY  2010  . LAPAROSCOPIC HYSTERECTOMY  10/2015   have ovaries  . RIGHT HEART CATH N/A 08/20/2017   Procedure: RIGHT HEART CATH;  Surgeon: Larey Dresser, MD;  Location: [place] [facility];  Service: Cardiovascular;  Laterality: N/A;  . TOTAL KNEE ARTHROPLASTY Left 04/04/2019   Procedure: TOTAL KNEE ARTHROPLASTY;  Surgeon: Gaynelle Arabian, MD;  [facility];  Service: Orthopedics;  Laterality: Left;  63mn  . TUBAL LIGATION      There were no vitals filed for this visit.   Subjective Assessment - 12/01/19 1236    Subjective I missed my appointment due ot being tired and having to be on bed rest due to high blood pressure.    Pertinent  History RA, OA, interstitial lung disease, anxiety, anemia, left knee TKA 04/05/19, Right knee to be replaced, depression    How long can you sit comfortably? 15-20 min depending on chair (recliner best); straight back painful    Diagnostic tests none    Patient Stated Goals be able to walk without stiffness, sit longer comfortably    Currently in Pain? Yes    Pain Score 5     Pain Location Back    Pain Orientation Left    Pain Descriptors / Indicators Sharp    Pain Type Chronic pain    Pain Onset More than a month ago    Pain Frequency Intermittent    Aggravating Factors  sit to long or stand too long    Pain Relieving Factors rest                             [facility] Adult PT Treatment/Exercise - 12/01/19 0001      Lumbar Exercises: Stretches   Active Hamstring Stretch Right;Left;2 reps;30 seconds    Active Hamstring Stretch Limitations sitting    Piriformis Stretch Right;Left;1 rep;30 seconds    Piriformis Stretch Limitations sitting      Lumbar Exercises: Aerobic   Nustep L2 x 6 min while being  assessed      Lumbar Exercises: Supine   Ab Set 5 reps;5 seconds    Other Supine Lumbar Exercises supine with feet on ball and rock back and forth in small motion then knees to chest in small range      Modalities   Modalities Electrical Stimulation;Moist Heat      Moist Heat Therapy   Number Minutes Moist Heat 15 Minutes    Moist Heat Location Lumbar Spine      Electrical Stimulation   Electrical Stimulation Location lumbar sacral area    Electrical Stimulation Action IFC    Electrical Stimulation Parameters 20 min, to patient tolerance    Electrical Stimulation Goals Pain      Manual Therapy   Manual Therapy Myofascial release;Soft tissue mobilization    Manual therapy comments using the suction cup and guafasen tool to release the fascia along the lumbar sacral area    Soft tissue mobilization soft tissue work to the lumbar praspinals, gluteals, quadratus     Myofascial Release fascial release to the lumbar sacral area with distraction and cross distraction                    PT Short Term Goals - 11/18/19 8315      PT SHORT TERM GOAL #3   Title Patient to demo improved standing posture with neutral trunk and equal WB to decrease strain on low back.    Baseline patient demonstrates improved posture from eval, but does not maintain neutral spine due to pain. She still WB's through [place].    Time 4    Period Weeks    Status On-going             PT Long Term Goals - 11/18/19 1761      PT LONG TERM GOAL #1   Title Patient to report decreased pain in the low back with ADLs by 70% or more.    Baseline right now is having a flare-up    Time 8    Period Weeks    Status On-going      PT LONG TERM GOAL #2   Title Patient able to climb stairs without an increase in back pain.    Baseline improved by 15%    Time 8    Period Weeks    Status On-going      PT LONG TERM GOAL #3   Title Patient to report decreased stiffness with waking by 70% or more.    Baseline 50% decrease    Time 8    Period Weeks    Status On-going      PT LONG TERM GOAL #4   Title Patient to report increased sitting tolerance to 1 hour.    Baseline 15-20 min    Time 8    Period Weeks    Status On-going                 Plan - 12/01/19 1305    Clinical Impression Statement Patient pain level decreased to 5/10 from 7/10. Patient continues to have tightness in her low back. Patient will be having total knee replacement on 12/15/2019. Patient is able to move with greater ease. Patient will benefit from skilled therapy to manage lumbar pain, improve tissue mobility and gentle strengthening.    Personal Factors and Comorbidities Comorbidity 3+    Comorbidities RA, OA, interstitial lung disease, anxiety, anemia, left knee TKA 04/05/19, Right knee to be replaced, depression  Examination-Activity Limitations Bend;Stairs    Stability/Clinical Decision  Making Evolving/Moderate complexity    Rehab Potential Good    PT Frequency 2x / week    PT Duration 4 weeks    PT Treatment/Interventions ADLs/Self Care Home Management;Cryotherapy;Therapist, sports;Therapeutic activities;Therapeutic exercise;Neuromuscular re-education;Patient/family education;Manual techniques;Dry needling;Taping;Spinal Manipulations;Joint Manipulations    PT Next Visit Plan continue with stim and heat at end of treatment, gentle spinal strengthening, work on tissue mobtility; discharge next visit    PT [place]; supine "reverse C" stretch for QL    Consulted and Agree with Plan of Care Patient           Patient will benefit from skilled therapeutic intervention in order to improve the following deficits and impairments:  Abnormal gait, Decreased range of motion, Pain, Increased muscle spasms, Impaired flexibility, Postural dysfunction, Decreased strength  Visit Diagnosis: Chronic bilateral low back pain without sciatica  Cramp and spasm  Muscle weakness (generalized)  Abnormal posture     Problem List Patient Active Problem List   Diagnosis Date Noted  . OA (osteoarthritis) of knee 04/04/2019  . Acute bronchitis 02/13/2017  . Cough variant asthma  vs uacs  12/17/2016  . Morbid obesity due to excess calories ([place]) 12/17/2016  . Contracture of left elbow 11/21/2016  . Primary osteoarthritis of both knees 11/21/2016  . Pain in right hand 09/09/2016  . Pain in left hand 09/09/2016  . History of osteoarthritis 09/09/2016  . Rheumatoid factor positive 08/01/2016  . High risk medication use 01/16/2015  . G6PD deficiency 10/31/2014  . Dyspnea 08/23/2014  . ILD (interstitial lung disease) ([place]) 08/23/2014  . Other organ or system involvement in systemic lupus erythematosus ([place]) 08/23/2014    Earlie Counts, PT 12/01/19 1:11 PM   [facility] [address], [place], [place], [address] Phone: [phone]   Fax:  [phone]  Name: MAJA MCCAFFERY MRN: [id number] Date of Birth: 05-04-1971  PHYSICAL THERAPY DISCHARGE SUMMARY  Visits from Start of Care: 12  Current functional level related to goals / functional outcomes: See above. Patient has not returned since her last visit. Patient no-showed her visits.    Remaining deficits: See above.    Education / Equipment: HEP Plan:                                                    Patient goals were not met. Patient is being discharged due to not returning since the last visit. Thank you for the referral. Earlie Counts, PT 12/23/19 8:30 AM   ?????

## 2019-12-09 DIAGNOSIS — M1711 Unilateral primary osteoarthritis, right knee: Secondary | ICD-10-CM | POA: Diagnosis not present

## 2019-12-09 DIAGNOSIS — M329 Systemic lupus erythematosus, unspecified: Secondary | ICD-10-CM | POA: Diagnosis not present

## 2019-12-09 DIAGNOSIS — G894 Chronic pain syndrome: Secondary | ICD-10-CM | POA: Diagnosis not present

## 2019-12-09 DIAGNOSIS — Z96652 Presence of left artificial knee joint: Secondary | ICD-10-CM | POA: Diagnosis not present

## 2019-12-09 DIAGNOSIS — M545 Low back pain: Secondary | ICD-10-CM | POA: Diagnosis not present

## 2019-12-09 DIAGNOSIS — M25561 Pain in right knee: Secondary | ICD-10-CM | POA: Diagnosis not present

## 2019-12-12 ENCOUNTER — Encounter (HOSPITAL_COMMUNITY): Payer: Medicare HMO

## 2019-12-16 ENCOUNTER — Telehealth: Payer: Self-pay | Admitting: Physical Therapy

## 2019-12-16 ENCOUNTER — Ambulatory Visit: Payer: Medicare HMO | Admitting: Physical Therapy

## 2019-12-16 NOTE — Telephone Encounter (Signed)
Called patient about her appointment she missed today at 845. Left a message.  Eulis Foster, PT @6 /25/2021@ 9:08 AM

## 2019-12-19 ENCOUNTER — Ambulatory Visit: Admit: 2019-12-19 | Payer: Medicare HMO | Admitting: Orthopedic Surgery

## 2019-12-19 SURGERY — ARTHROPLASTY, KNEE, TOTAL
Anesthesia: Choice | Site: Knee | Laterality: Right

## 2019-12-21 ENCOUNTER — Other Ambulatory Visit: Payer: Self-pay

## 2019-12-21 ENCOUNTER — Emergency Department (HOSPITAL_COMMUNITY): Payer: Medicare HMO

## 2019-12-21 ENCOUNTER — Emergency Department (HOSPITAL_COMMUNITY)
Admission: EM | Admit: 2019-12-21 | Discharge: 2019-12-22 | Disposition: A | Discharge status: Home / Self Care | Payer: Medicare HMO | Attending: Emergency Medicine | Admitting: Emergency Medicine

## 2019-12-21 ENCOUNTER — Encounter (HOSPITAL_COMMUNITY): Payer: Self-pay | Admitting: Emergency Medicine

## 2019-12-21 DIAGNOSIS — Y998 Other external cause status: Secondary | ICD-10-CM | POA: Diagnosis not present

## 2019-12-21 DIAGNOSIS — R0789 Other chest pain: Secondary | ICD-10-CM | POA: Diagnosis not present

## 2019-12-21 DIAGNOSIS — S29012A Strain of muscle and tendon of back wall of thorax, initial encounter: Secondary | ICD-10-CM | POA: Insufficient documentation

## 2019-12-21 DIAGNOSIS — R059 Cough, unspecified: Secondary | ICD-10-CM

## 2019-12-21 DIAGNOSIS — Z87891 Personal history of nicotine dependence: Secondary | ICD-10-CM | POA: Diagnosis not present

## 2019-12-21 DIAGNOSIS — R0602 Shortness of breath: Secondary | ICD-10-CM | POA: Diagnosis not present

## 2019-12-21 DIAGNOSIS — Y9289 Other specified places as the place of occurrence of the external cause: Secondary | ICD-10-CM | POA: Diagnosis not present

## 2019-12-21 DIAGNOSIS — X501XXA Overexertion from prolonged static or awkward postures, initial encounter: Secondary | ICD-10-CM | POA: Diagnosis not present

## 2019-12-21 DIAGNOSIS — Y9389 Activity, other specified: Secondary | ICD-10-CM | POA: Insufficient documentation

## 2019-12-21 DIAGNOSIS — R05 Cough: Secondary | ICD-10-CM | POA: Insufficient documentation

## 2019-12-21 DIAGNOSIS — S29002A Unspecified injury of muscle and tendon of back wall of thorax, initial encounter: Secondary | ICD-10-CM | POA: Diagnosis present

## 2019-12-21 LAB — BASIC METABOLIC PANEL
Anion gap: 12 (ref 5–15)
BUN: 12 mg/dL (ref 6–20)
CO2: 23 mmol/L (ref 22–32)
Calcium: 8.8 mg/dL — ABNORMAL LOW (ref 8.9–10.3)
Chloride: 105 mmol/L (ref 98–111)
Creatinine, Ser: 0.82 mg/dL (ref 0.44–1.00)
GFR calc Af Amer: >60 mL/min (ref >60)
GFR calc non Af Amer: >60 mL/min (ref >60)
Glucose, Bld: 87 mg/dL (ref 70–99)
Potassium: 3.5 mmol/L (ref 3.5–5.1)
Sodium: 140 mmol/L (ref 135–145)

## 2019-12-21 LAB — CBC
HCT: 34.1 % — ABNORMAL LOW (ref 36.0–46.0)
Hemoglobin: 10.6 g/dL — ABNORMAL LOW (ref 12.0–15.0)
MCH: 29.4 pg (ref 26.0–34.0)
MCHC: 31.1 g/dL (ref 30.0–36.0)
MCV: 94.7 fL (ref 80.0–100.0)
Platelets: 286 10*3/uL (ref 150–400)
RBC: 3.6 MIL/uL — ABNORMAL LOW (ref 3.87–5.11)
RDW: 13.2 % (ref 11.5–15.5)
WBC: 6.4 10*3/uL (ref 4.0–10.5)
nRBC: 0 % (ref 0.0–0.2)

## 2019-12-21 LAB — TROPONIN I (HIGH SENSITIVITY): Troponin I (High Sensitivity): <2 ng/L (ref <18)

## 2019-12-21 MED ORDER — METHOCARBAMOL 500 MG PO TABS
500.0000 mg | ORAL_TABLET | Freq: Once | ORAL | Status: AC
  Administered 2019-12-21: 500 mg via ORAL
  Filled 2019-12-21: qty 1

## 2019-12-21 MED ORDER — KETOROLAC TROMETHAMINE 30 MG/ML IJ SOLN
30.0000 mg | Freq: Once | INTRAMUSCULAR | Status: AC
  Administered 2019-12-21: 30 mg via INTRAVENOUS
  Filled 2019-12-21: qty 1

## 2019-12-21 MED ORDER — GUAIFENESIN-CODEINE 100-10 MG/5ML PO SOLN
5.0000 mL | Freq: Once | ORAL | Status: AC
  Administered 2019-12-21: 5 mL via ORAL
  Filled 2019-12-21: qty 5

## 2019-12-21 MED ORDER — IPRATROPIUM-ALBUTEROL 0.5-2.5 (3) MG/3ML IN SOLN
3.0000 mL | Freq: Once | RESPIRATORY_TRACT | Status: AC
  Administered 2019-12-22: 3 mL via RESPIRATORY_TRACT
  Filled 2019-12-21: qty 3

## 2019-12-21 NOTE — ED Triage Notes (Signed)
Patient has been coughing for over a week, states now she has L sided pain at her rib-cage, is unable to sleep at night. Has a dry, non-productive cough.

## 2019-12-21 NOTE — ED Provider Notes (Signed)
[facility]-EMERGENCY DEPT Provider Note   CSN: [id number] Arrival date & time: 12/21/19  1724     History No chief complaint on file.   Shannon [name] is a 49 y.o. female with a history of rheumatoid arthritis, SLE, G6PD deficiency, and interstitial lung disease who presents to the emergency department with a chief complaint of left rib pain.  The patient reports that she has had a severe dry cough for the last 2 weeks.  Cough is worse at night.  No other known aggravating or alleviating factors.  The patient reports that approximately 4 days ago that she developed sudden onset, left posterior rib pain with shortness of breath.  Pain is worse with deep inspiration and positional changes.  No fever, chills, chest tightness, dysuria, hematuria, abdominal pain, N/V/D, palpitations, leg swelling, dizziness, lightheadedness, neck pain, rashes, vaginal bleeding, or discharge.  She drove to [place] in 1 day last week and then made the return trip in a single day approximately 2 days later.  No OCPs.  No personal or familial history of VTE.  No recent surgery or immobilization.  Patient is fully vaccinated against COVID-19.  The history is provided by the patient and medical records. No language interpreter was used.       Past Medical History:  Diagnosis Date  . Anemia   . Anxiety   . Dyspnea   . G6PD deficiency   . ILD (interstitial lung disease) (HCC)   . Lupus (HCC)   . Rheumatoid arthritis [facility])     Patient Active Problem List   Diagnosis Date Noted  . OA (osteoarthritis) of knee 04/04/2019  . Acute bronchitis 02/13/2017  . Cough variant asthma  vs uacs  12/17/2016  . Morbid obesity due to excess calories (HCC) 12/17/2016  . Contracture of left elbow 11/21/2016  . Primary osteoarthritis of both knees 11/21/2016  . Pain in right hand 09/09/2016  . Pain in left hand 09/09/2016  . History of osteoarthritis 09/09/2016  . Rheumatoid factor positive  08/01/2016  . High risk medication use 01/16/2015  . G6PD deficiency 10/31/2014  . Dyspnea 08/23/2014  . ILD (interstitial lung disease) (HCC) 08/23/2014  . Other organ or system involvement in systemic lupus erythematosus (HCC) 08/23/2014    Past Surgical History:  Procedure Laterality Date  . ABDOMINAL HYSTERECTOMY    . APPENDECTOMY  1980  . cardiac catherization  08/20/2017  . CARDIAC CATHETERIZATION N/A 05/10/2015   Procedure: Right Heart Cath;  Surgeon: Laurey Morale, MD;  Location: [facility] INVASIVE CV LAB;  Service: Cardiovascular;  Laterality: N/A;  . CESAREAN SECTION  '95, '02, '07   X 3  . CHOLECYSTECTOMY  2010  . LAPAROSCOPIC HYSTERECTOMY  10/2015   have ovaries  . RIGHT HEART CATH N/A 08/20/2017   Procedure: RIGHT HEART CATH;  Surgeon: Laurey Morale, MD;  Location: [facility] INVASIVE CV LAB;  Service: Cardiovascular;  Laterality: N/A;  . TOTAL KNEE ARTHROPLASTY Left 04/04/2019   Procedure: TOTAL KNEE ARTHROPLASTY;  Surgeon: Ollen Gross, MD;  Location: [facility] ORS;  Service: Orthopedics;  Laterality: Left;   . TUBAL LIGATION       OB History   No obstetric history on file.     Family History  Problem Relation Age of Onset  . Sarcoidosis Mother   . Lupus Sister   . Healthy Daughter   . Healthy Son   . Healthy Son     Social History   Tobacco Use  . Smoking status:  Former Smoker    Packs/day: 0.50    Years: 14.00    Pack years: 7.00    Types: Cigarettes    Quit date: 09/25/2014    Years since quitting: 5.2  . Smokeless tobacco: Never Used  Vaping Use  . Vaping Use: Never used  Substance Use Topics  . Alcohol use: No    Alcohol/week: 0.0 standard drinks  . Drug use: No    Home Medications Prior to Admission medications   Medication Sig Start Date End Date Taking? Authorizing Provider  albuterol (VENTOLIN HFA) 108 (90 Base) MCG/ACT inhaler Inhale 2 puffs into the lungs every 6 (six) hours as needed for wheezing or shortness of breath. 11/02/18  Yes  Kalman Shan, MD  buPROPion (WELLBUTRIN XL) 150 MG 24 hr tablet Take 150 mg by mouth daily.  01/27/18  Yes [provider]  busPIRone (BUSPAR) 10 MG tablet Take 10 mg by mouth daily.   Yes [provider]  famotidine (PEPCID) 20 MG tablet Take 20 mg by mouth at bedtime.   Yes [provider]  ferrous sulfate 325 (65 FE) MG tablet Take 650 mg by mouth daily with breakfast.   Yes [provider]  hydroxychloroquine (PLAQUENIL) 200 MG tablet Take 400 mg by mouth daily.    Yes [provider]  Magnesium Oxide (MAG-200) 200 MG TABS Take 200 mg by mouth at bedtime.   Yes [provider]  mycophenolate (CELLCEPT) 500 MG tablet Take 1 tablet (500 mg total) by mouth 2 (two) times daily. Patient taking differently: Take 1,000 mg by mouth 2 (two) times daily.  06/06/19  Yes Kalman Shan, MD  Oxycodone HCl 10 MG TABS Take 10 mg by mouth 2 (two) times daily as needed for pain. 12/12/19  Yes [provider]  QUEtiapine (SEROQUEL) 200 MG tablet Take 200 mg by mouth at bedtime.    Yes [provider]  sertraline (ZOLOFT) 100 MG tablet Take 150 mg by mouth at bedtime.  02/23/18  Yes [provider]  guaiFENesin-codeine 100-10 MG/5ML syrup Take 5 mLs by mouth every 6 (six) hours as needed for cough. 12/22/19   Trany Chernick A, PA-C  lidocaine (LIDODERM) 5 % Place 1 patch onto the skin daily. Remove & Discard patch within 12 hours or as directed by MD 12/22/19   Rhiannon Sassaman A, PA-C  methocarbamol (ROBAXIN) 500 MG tablet Take 1 tablet (500 mg total) by mouth 2 (two) times daily. 12/22/19   Tarahji Ramthun A, PA-C    Allergies    Asa [aspirin], Penicillins, and Sulfa antibiotics  Review of Systems   Review of Systems  Constitutional: Negative for activity change, chills and fever.  HENT: Negative for congestion.   Respiratory: Positive for cough and shortness of breath. Negative for wheezing.   Cardiovascular: Positive for chest  pain. Negative for palpitations and leg swelling.  Gastrointestinal: Negative for abdominal pain, diarrhea, nausea and vomiting.  Genitourinary: Positive for flank pain. Negative for dysuria.  Musculoskeletal: Positive for back pain. Negative for myalgias and neck stiffness.  Skin: Negative for rash.  Allergic/Immunologic: Negative for immunocompromised state.  Neurological: Negative for seizures, syncope, weakness and headaches.  Psychiatric/Behavioral: Negative for confusion.    Physical Exam Updated Vital Signs BP 130/85   Pulse 83   Temp 98.6 F (37 C) (Oral)   Resp 15   Ht 5\' 11"  (1.803 m)   Wt 128.8 kg   LMP 04/28/2015   SpO2 100%   BMI 39.61 kg/m  Physical Exam Vitals and nursing note reviewed.  Constitutional:      General: She is not in acute distress.    Appearance: She is obese. She is not diaphoretic.  HENT:     Head: Normocephalic.  Eyes:     Conjunctiva/sclera: Conjunctivae normal.  Cardiovascular:     Rate and Rhythm: Normal rate and regular rhythm.     Heart sounds: No murmur heard.  No friction rub. No gallop.   Pulmonary:     Effort: Pulmonary effort is normal. No respiratory distress.     Breath sounds: No stridor. No wheezing, rhonchi or rales.     Comments: Lungs are clear to auscultation bilaterally.  Dry, hacking cough is noted on exam.   Significant focal tender to palpation to the inferior left posterior and lateral rib.  No crepitus or step-offs.  No rashes.  No overlying erythema, induration, warmth. Chest:     Chest wall: Tenderness present.  Abdominal:     General: There is no distension.     Palpations: Abdomen is soft.     Tenderness: There is no abdominal tenderness. There is no right CVA tenderness, left CVA tenderness or guarding.     Comments: No CVA tenderness bilaterally.  Abdomen soft, nontender, nondistended.  Musculoskeletal:       Arms:     Cervical back: Neck supple.     Right lower leg: No edema.     Left lower leg: No  edema.     Comments: No calf tenderness bilaterally.  Skin:    General: Skin is warm.     Findings: No rash.  Neurological:     Mental Status: She is alert.  Psychiatric:        Behavior: Behavior normal.     ED Results / Procedures / Treatments   Labs (all labs ordered are listed, but only abnormal results are displayed) Labs Reviewed  BASIC METABOLIC PANEL - Abnormal; Notable for the following components:      Result Value   Calcium 8.8 (*)    All other components within normal limits  CBC - Abnormal; Notable for the following components:   RBC 3.60 (*)    Hemoglobin 10.6 (*)    HCT 34.1 (*)    All other components within normal limits  D-DIMER, QUANTITATIVE (NOT AT [facility]) - Abnormal; Notable for the following components:   D-Dimer, Quant 1.71 (*)    All other components within normal limits  TROPONIN I (HIGH SENSITIVITY)  TROPONIN I (HIGH SENSITIVITY)    EKG None  Radiology DG Chest 2 View  Result Date: 12/21/2019 CLINICAL DATA:  Cough EXAM: CHEST - 2 VIEW COMPARISON:  02/13/2017 FINDINGS: The heart size and mediastinal contours are within normal limits. Both lungs are clear. The visualized skeletal structures are unremarkable. IMPRESSION: Normal study. Electronically Signed   By: Charlett Nose M.D.   On: 12/21/2019 18:13   CT Angio Chest PE W and/or Wo Contrast  Result Date: 12/22/2019 CLINICAL DATA:  Cough and shortness of breath EXAM: CT ANGIOGRAPHY CHEST WITH CONTRAST TECHNIQUE: Multidetector CT imaging of the chest was performed using the standard protocol during bolus administration of intravenous contrast. Multiplanar CT image reconstructions and MIPs were obtained to evaluate the vascular anatomy. CONTRAST:  OMNIPAQUE IOHEXOL 350 MG/ML SOLN COMPARISON:  None. FINDINGS: Cardiovascular: Contrast injection is sufficient to demonstrate satisfactory opacification of the pulmonary arteries to the segmental level. There is no pulmonary embolus or evidence of right  heart strain. The size  of the main pulmonary artery is normal. Heart size is normal, with no pericardial effusion. The course and caliber of the aorta are normal. There is no atherosclerotic calcification. Opacification decreased due to pulmonary arterial phase contrast bolus timing. Mediastinum/Nodes: No mediastinal, hilar or axillary lymphadenopathy. Normal visualized thyroid. Thoracic esophageal course is normal. Lungs/Pleura: Airways are patent. No pleural effusion, lobar consolidation, pneumothorax or pulmonary infarction. Upper Abdomen: Contrast bolus timing is not optimized for evaluation of the abdominal organs. The visualized portions of the organs of the upper abdomen are normal. Musculoskeletal: No chest wall abnormality. No bony spinal canal stenosis. Review of the MIP images confirms the above findings. IMPRESSION: No pulmonary embolus or other acute thoracic abnormality. Electronically Signed   By: Deatra Robinson M.D.   On: 12/22/2019 02:24    Procedures Procedures (including critical care time)  Medications Ordered in ED Medications  sodium chloride (PF) 0.9 % injection (has no administration in time range)  ipratropium-albuterol (DUONEB) 0.5-2.5 (3) MG/3ML nebulizer solution 3 mL (3 mLs Nebulization Given 12/22/19 0004)  methocarbamol (ROBAXIN) tablet 500 mg (500 mg Oral Given 12/21/19 2348)  ketorolac (TORADOL) 30 MG/ML injection 30 mg (30 mg Intravenous Given 12/21/19 2357)  guaiFENesin-codeine 100-10 MG/5ML solution 5 mL (5 mLs Oral Given 12/21/19 2349)  iohexol (OMNIPAQUE) 350 MG/ML injection 100 mL (100 mLs Intravenous Contrast Given 12/22/19 0135)    ED Course  I have reviewed the triage vital signs and the nursing notes.  Pertinent labs & imaging results that were available during my care of the patient were reviewed by me and considered in my medical decision making (see chart for details).    MDM Rules/Calculators/A&P                          49 year old female with a  history of rheumatoid arthritis, SLE, G6PD deficiency, and interstitial lung disease presenting with a nonproductive cough for 2 weeks, but over the last 4 days she developed left sided inferior pleuritic rib pain.  She drove to and from [place] last week, prior to the onset of pain.  No constitutional symptoms.  No GI or GU complaints.  Differential diagnosis includes pyelonephritis, early onset of shingles, strained muscle, or pulmonary infarct from PE.  Doubt pyelonephritis that she is not having urinary complaints.  Chest x-ray is unremarkable.  No evidence of rib fracture on my evaluation.  No electrolyte derangements.  Given concern for PE since she is not PERC negative, D-dimer was obtained, which was elevated.  PE study was unremarkable.   On re-evaluation, she is feeling much better.  Will discharge home with Robaxin and Lidoderm patches with OTC analgesia and a short course of guaifenesin codeine cough syrup.  She was advised to follow-up with primary care regarding her cough.  All questions answered.  She is hemodynamically stable and in no acute distress.  ER return precautions given.  Safe for discharge home with outpatient follow-up as indicated.  Final Clinical Impression(s) / ED Diagnoses Final diagnoses:  Muscle strain of left upper back, initial encounter  Cough in adult patient    Rx / DC Orders ED Discharge Orders         Ordered    guaiFENesin-codeine 100-10 MG/5ML syrup  Every 6 hours PRN     Discontinue  Reprint     12/22/19 0254    lidocaine (LIDODERM) 5 %  Every 24 hours     Discontinue  Reprint     12/22/19  0254    methocarbamol (ROBAXIN) 500 MG tablet  2 times daily     Discontinue  Reprint     12/22/19 0254           Frederik Pear A, PA-C 12/22/19 1660    Shon Baton, MD 12/22/19 [phone]

## 2019-12-22 ENCOUNTER — Encounter (HOSPITAL_COMMUNITY): Payer: Self-pay

## 2019-12-22 ENCOUNTER — Emergency Department (HOSPITAL_COMMUNITY): Payer: Medicare HMO

## 2019-12-22 DIAGNOSIS — S29012A Strain of muscle and tendon of back wall of thorax, initial encounter: Secondary | ICD-10-CM | POA: Diagnosis not present

## 2019-12-22 DIAGNOSIS — R0602 Shortness of breath: Secondary | ICD-10-CM | POA: Diagnosis not present

## 2019-12-22 LAB — D-DIMER, QUANTITATIVE: D-Dimer, Quant: 1.71 ug/mL-FEU — ABNORMAL HIGH (ref 0.00–0.50)

## 2019-12-22 MED ORDER — METHOCARBAMOL 500 MG PO TABS
500.0000 mg | ORAL_TABLET | Freq: Two times a day (BID) | ORAL | 0 refills | Status: DC
Start: 2019-12-22 — End: 2020-04-30

## 2019-12-22 MED ORDER — SODIUM CHLORIDE (PF) 0.9 % IJ SOLN
INTRAMUSCULAR | Status: AC
  Filled 2019-12-22: qty 50

## 2019-12-22 MED ORDER — LIDOCAINE 5 % EX PTCH: 1.0000 | MEDICATED_PATCH | CUTANEOUS | 0 refills | Status: DC

## 2019-12-22 MED ORDER — TETANUS-DIPHTH-ACELL PERTUSSIS 5-2.5-18.5 LF-MCG/0.5 IM SUSP: 0.5000 mL | Freq: Once | INTRAMUSCULAR | Status: DC

## 2019-12-22 MED ORDER — IOHEXOL 350 MG/ML SOLN
100.0000 mL | Freq: Once | INTRAVENOUS | Status: AC | PRN
  Administered 2019-12-22: 100 mL via INTRAVENOUS

## 2019-12-22 MED ORDER — GUAIFENESIN-CODEINE 100-10 MG/5ML PO SOLN: 5.0000 mL | Freq: Four times a day (QID) | ORAL | 0 refills | Status: DC | PRN

## 2019-12-22 NOTE — Discharge Instructions (Signed)
Thank you for allowing me to care for you today in the Emergency Department.   You were seen today for pain in your back and ribs in the setting of a cough for a few weeks.  Your work-up was negative for a blood clot in your lungs and pneumonia.  Take 650 mg of Tylenol or 600 mg of ibuprofen with food every 6 hours for pain.  You can alternate between these 2 medications every 3 hours if your pain returns.  For instance, you can take Tylenol at noon, followed by a dose of ibuprofen at 3, followed by second dose of Tylenol and 6.  You can also take 1 tablet of Robaxin by mouth 2 times daily.  This is a muscle relaxer and can help with pain.  Do not work or drive while using this medication until you know how it impacts you as it may make you drowsy.  Apply a lidocaine patch to areas that are painful as directed on the label.  You can also take 5 mL of guaifenesin codeine cough syrup every 6 hours as needed for cough.  Do not drink alcohol or take other sedating substances with this medication as it is a narcotic and can cause you to be impaired and make you drowsy.  Do not work or drive while taking this medication as well.  You can also try using guaifenesin alone, which is available over-the-counter.  Use your albuterol inhaler at night to help improve your cough.  Please follow-up with your primary care clinician if your symptoms do not significantly improve with this regimen in the next week.  Return to the emergency department if you develop respiratory distress, if you pass out, if your fingers or lips turn blue, or if you develop other new, concerning symptoms.

## 2019-12-28 DIAGNOSIS — M1711 Unilateral primary osteoarthritis, right knee: Secondary | ICD-10-CM | POA: Diagnosis not present

## 2019-12-28 DIAGNOSIS — G894 Chronic pain syndrome: Secondary | ICD-10-CM | POA: Diagnosis not present

## 2019-12-28 DIAGNOSIS — Z96652 Presence of left artificial knee joint: Secondary | ICD-10-CM | POA: Diagnosis not present

## 2019-12-28 DIAGNOSIS — M25561 Pain in right knee: Secondary | ICD-10-CM | POA: Diagnosis not present

## 2019-12-28 DIAGNOSIS — M329 Systemic lupus erythematosus, unspecified: Secondary | ICD-10-CM | POA: Diagnosis not present

## 2019-12-28 DIAGNOSIS — Z1389 Encounter for screening for other disorder: Secondary | ICD-10-CM | POA: Diagnosis not present

## 2019-12-28 DIAGNOSIS — Z79899 Other long term (current) drug therapy: Secondary | ICD-10-CM | POA: Diagnosis not present

## 2019-12-28 DIAGNOSIS — M545 Low back pain: Secondary | ICD-10-CM | POA: Diagnosis not present

## 2020-01-02 DIAGNOSIS — Z6841 Body Mass Index (BMI) 40.0 and over, adult: Secondary | ICD-10-CM | POA: Diagnosis not present

## 2020-01-02 DIAGNOSIS — I10 Essential (primary) hypertension: Secondary | ICD-10-CM | POA: Diagnosis not present

## 2020-01-09 ENCOUNTER — Encounter: Payer: Self-pay | Admitting: Internal Medicine

## 2020-01-09 ENCOUNTER — Ambulatory Visit (INDEPENDENT_AMBULATORY_CARE_PROVIDER_SITE_OTHER): Payer: Medicare HMO | Admitting: Internal Medicine

## 2020-01-09 ENCOUNTER — Other Ambulatory Visit: Payer: Self-pay

## 2020-01-09 VITALS — BP 134/84 | HR 102 | Temp 98.9°F | Ht 71.0 in | Wt 286.0 lb

## 2020-01-09 DIAGNOSIS — M359 Systemic involvement of connective tissue, unspecified: Secondary | ICD-10-CM | POA: Diagnosis not present

## 2020-01-09 DIAGNOSIS — Z79899 Other long term (current) drug therapy: Secondary | ICD-10-CM

## 2020-01-09 DIAGNOSIS — R0681 Apnea, not elsewhere classified: Secondary | ICD-10-CM | POA: Diagnosis not present

## 2020-01-09 DIAGNOSIS — Z7189 Other specified counseling: Secondary | ICD-10-CM | POA: Diagnosis not present

## 2020-01-09 DIAGNOSIS — J8489 Other specified interstitial pulmonary diseases: Secondary | ICD-10-CM | POA: Diagnosis not present

## 2020-01-09 DIAGNOSIS — Z87898 Personal history of other specified conditions: Secondary | ICD-10-CM | POA: Diagnosis not present

## 2020-01-09 DIAGNOSIS — Z7185 Encounter for immunization safety counseling: Secondary | ICD-10-CM

## 2020-01-09 NOTE — Patient Instructions (Addendum)
Interstitial lung disease due to connective tissue disease (HCC) High risk medication use  Stable without change; good news  Plan ' -Continue CellCept through the rheumatologist -Continue Plaquenil through the rheumatologist -Noted that he not on prednisone anymore -If pulmonary fibrosis gets worse then we can add nintedanib -Get spirometry and DLCO in 6 months  History of snoring Witnessed apneic spells  - good chance you have sleep apnea  Plan  =refere sleep doc  Vaccine counseling  PLAN  - CMA to document your covid vaccine status  - Please talk to PCP [name], [name], PA-C -  and ensure you get  shingrix (GSK) inactivated vaccine against shingles  Follow-up -Refer sleep doctor -Spirometry and DLCO in 6 months -Return to see Dr. Marchelle Gearing 30-minute ILD slot in 6 months

## 2020-01-09 NOTE — Progress Notes (Signed)
PCP [name],[name], PA-C Referred by Dr [name]  HPI   IOV 08/23/2014 [name]    Pt referred by Dr. Corliss Skains for ILD. Pt c/o SOB with activity and rest, dry cough and chest tightness also with and without activity.    49 year old female referred for evaluation of autoimmune interstitial lung disease. She presents with her husband. In 2010 while living in [place] she reports she was diagnosed with lupus associated with rheumatoid arthritis in her joints. In 2012 she moved to live in [facility] and several months after that started noticing insidious onset of shortness of breath. Local rheumatologist diagnosed her with interstitial lung disease. She was referred to Dr. Herma Carson at [facility] in [facility] and was started on CellCept/prednisone for autoimmune interstitial lung disease he and however, sometime around 2 years ago she ran out of medical insurance and stopped taking these medications. During this time her dyspnea has progressed. It is currently rated as moderate to severe. It is present on exertion and relieved by rest. Even minimal amount of exertion makes her very ddyspneic. Now she has insurance and she did see Dr. Frederik Pear locally and she has autoimmune panel lab ordered. In addition exam revealed crackles and there for she's been referred here for reevaluation of interstitial lung disease and dyspnea. Dyspnea is associated with some chest tightness but no chest pain. This no associated dizzine   11/24/2014 Follow UP ILD  [name]  She has autoimmune ILD with RA/Lupus  She was seen 6 weeks ago, restarted on Cellcept.  Previously on cellcept but lost her insurance until recently.  On low dose prednisone 5mg  daily .  Last CT chest 4/4 showed ILD changes similar to 2013. Echo was ok with EFG 55-60%, nml PAP . In  March .   Did not take dapsone ,due to  GP6D deficiency.  Labs ok last week with nml LFT , no sign change in hbg. /wbc.  She is feeling better. Does feel her breathing is some better.  No flare of cough or wheezing.     OV 01/16/2015  Chief Complaint  Patient presents with  . Follow-up    Pt c/o DOE, mild dry cough, and chest tightness when SOB. Pt states the chest tightness has improved).    follow-up interstitial lung disease in the setting of rheumatoid arthritis Follow-up high risk medication use - on CellCept and prednisone since mid April 2016  - Presents with her husband. Both give a history. Overall she is doing better in terms of dyspnea after starting CellCept and prednisone. However the improvement this only moderate. She still left with a residual moderate dyspnea on exertion that is also made worse with bending or heart air and improved with rest and cool air. It is associated with some cough and wheezing. She takes albuterol inhaler which she feels helps only somewhat but not fully and not quickly enough. She is frustrated by this. In addition she's complaining of some associated right lower back paraspinal spasm for which massage gives her relief. He has never attended pulmonary rehabilitation.  - Lab review shows she has had problems with potassium and has had potassium supplementation. Last lab check was 12/08/2014. She is due for lab test right now   OV 03/28/2015   Chief Complaint  Patient presents  with  . Follow-up    3 month follow up. Pt states that she is still having some problems with her breathing. Pt c/o of feeling chest tightness, chest pain and cough that is dry. Pt denies wheeze. Pt states that she did trial the Advair and does feel that it helped some.   Follow-up interstitial lung disease secondary to autoimmune process and associated dyspnea that seems out of proportion\  She presents with her husband. She continues on CellCept and prednisone. In the  last visit approximately 2-3 months ago she had out of proportion dyspnea. I gave her some Advair to try. She says this only helped a little bit. Overall she says that dyspnea still persists. It is worse than in the spring 2016 when she started CellCept and prednisone. It is stable since July 2016. It is moderate in intensity. Occurs randomly at rest but also with exertion. Occasionally relieved by rest but also happens at rest. Heat and humidity make it worse. Advair helped a little bit only. This no associated chest tightness or wheezing. She did try and enroll  in pulmonary rehabilitation but could not afford the the startup program and is waiting to hear from them for the maintenance program. She is frustrated by all the symptoms.  Pulmonary function test October 15,016 today FVC 2.55 L/64%, total lung capacity 4 L/65% and diffusion 14.5/42%. Overall no change since April 2016   OV 07/04/2015  Chief Complaint  Patient presents with  . Follow-up    pt. states breathing is baseline. SOB. dry cough. wheezing. occ. chest pain/tightness. feels her airway is blocked.   Follow-up interstitial lung disease admitted autoimmune processes and associated out of proportion dyspnea  She presents again with her husband. She continues on CellCept 2 g twice daily associated with prednisone. She cannot take Bactrim or dapsone due to G6PD deficiency. Last seen in the fall of 2016. She was having out of proportion dyspnea. Rated cardia pulmonary stress test and she could not tolerate this test. She then underwent right heart catheterization mid-November 2016 with Dr. Marca Ancona. Review the tests this is normal. Overall stable but she and husband still continued to complain about this resting dyspnea associated with wheezing. They hear noises in the chest. Sometime she gasps for air even at rest. She feels it comes from the chest but the husband points to the throat. I offered second opinion at [facility]  because of this unusual symptoms but they declined citing distance. She was supposed to attend pulmonary rehabilitation but they cannot afford a $60 co-pay twice a week 8 weeks. Offered ENT evaluation that agreeable but they wanted done in [place] which is closer logistically. In addition patient is contemplating now applying for disability. 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  Walking desaturation test 185 feet 3 laps and rheumatic: No desaturation    OV 10/12/2015  Chief Complaint  Patient presents with  . Follow-up    PFT today.  breathing is better.  Pt is currently on STD, [employer] approved STD until today, wants to know today if pt is able to return to work, any restrictions.  Pt states that she has been more stable while out of work.      Follow-up interstitial lung disease due to autoimmune processes not otherwise specified  Last seen January 2017. Since then she has been on short-term disability and out of work. She does heavy manual work. The lack of work estimated dyspnea better. She did pulmonary  function test today that I personally visualized and overall it is same. There are no new issues. She is on CellCept and she is tolerating this fine. Last visit she had some anemia we follow this up and the anemia was better. Also hypokalemia resolved by February 2017. She is due for blood work today. 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 This showed up at last visit. She saw an ENT in [place] within referred her to [facility]. She does not want to go to [facility]. She ended up seeing Dr. Ezzard Standing locally. But now she is going to see Dr.  Dina Rich [facility]   Pulmonary function test today 10/12/2015 shows postbronchodilator FVC 2.65 L/67%. FEV1 2.34 L/72% which is up 17% positive bronchodilator response. Ratio of 80/106%. Total lung capacity of 3.8/62%. DLCO  18.36/52%. Overall consistent with restriction and lung parenchymal disease. Overall PFTs are stable compared to April 2016 but perhaps in DLCO slightly better   OV 04/15/2016  Chief Complaint  Patient presents with  . Interstitial Lung Disease    Breathing is unchanged since last OV. Reports SOB, coughing. Cough is non productive. Denies chest tightness or wheezing.   Follow-up interstitial lung disease due to autoimmune processes not otherwise specified. On CellCept and prednisone. Not on Bactrim prophylaxis due to G6PD deficiency  Last visit April 2017. At that time based on pulmonary function tests showed stability and ILD for a year. 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 She did see local ENT doctor [place] and apparently has been reassured. At this point in time she is applying for long-term disability at work but the work discharged from services and she is now applying for Social Security disability. Her dyspnea stable since the interim. She's also had hysterectomy but for the last few weeks her cough is worse and it is dry. This no fever. Is no weight loss or chills. She says she's compliant with her CellCept and prednisone.  OV 05/29/2016  Chief Complaint  Patient presents with  . Follow-up    Pt states she still has harsh dry cough, pt states her SOB is unchanged and chest tightness when SOB or when coughing a lot. Pt deneis f/c/s.     Follow-up interstitial lung disease due to autoimmune processes not otherwise specified. On CellCept and prednisone since April 2016. Not on Bactrim prophylaxis due to G6PD deficiency  Elliyah returns for follow-up. She had high resolution CT chest that shows stability and interstitial lung disease since April 2016. She Pulmicort function test that shows mild improvement since April 2016. Therefore it appears that her CellCept and prednisone is helping her she'll lung disease related to collagen-vascular disease.  However she tells me that she continues to have chronic cough for the last few to several months. It is progressive. It is dry. It is worse than her baseline. The dyspnea is unchanged. This no fever or weight loss or chills   OV 07/24/2016  Chief Complaint  Patient presents with  . Follow-up    Pt states she feels the Arnuity has been helping her breathing. Pt c/o dry cough and occ chest tightness.   rec    ICD-9-CM ICD-10-CM   1. ILD (interstitial lung disease) (HCC) 515 J84.9   2. Chronic cough 786.2 R05   3. High risk medication use V58.69 Z79.899    Clinically improved with  cellcept/prednisone and arnuity Blood work ok dec 2017  pLAN - start pulm rehab at [facility] - continue  aruity daily - continue cellcept and prednisone  Followup - 6 months do Pre-bd spiro and dlco only. No lung volume or bd response. No post-bd spiro - 6 months fu Dr Marchelle Gearing after above or sooner if needed     12/15/2016  f/u ov/Wert re: cough dry on arnuity  Chief Complaint  Patient presents with  . Follow-up    Breathing is unchanged since her last visit. Pt states she is here to f/u on recent ABG result. She c/o feeling tired all of the time. She has occ cough- non prod.       Finished at Rehab   beginning  Of May  2018 and trouble walking fast or uphill Waking up with ha's since rehab sev days a week  Cough is new x one month mostly dry and day > noct   No obvious day to day or daytime variability or assoc excess/ purulent sputum or mucus plugs or hemoptysis or cp or chest tightness, subjective wheeze or overt sinus or hb symptoms. No unusual exp hx or h/o childhood pna/ asthma or knowledge of premature birth.   OV 02/13/2017  Chief Complaint  Patient presents with  . Follow-up    cough/ILD follow up - prod cough with brownish/green mucus with tightness and chest pain x2 days.  denies any f/c/s, hemoptysis.  PFT done today.    Follow-up interstitial lung disease due to autoimmune  processes not otherwise specified. On CellCept and prednisone since April 2016. Not on Bactrim prophylaxis due to G6PD deficiency  49 year old female immunosuppressed with CellCept and prednisone. Not seen her in many months. Currently taking prednisone 3 mg per day and CellCept 1000 mg twice daily. She cannot do Bactrim prophylaxis because of G6PD deficiency. She tells me that overall her health has been stable but in the last few weeks noticing increased shortness of breath in the last few days this increased cough and change in color of sputum to green and increased chest tightness and a feeling that she is getting acute bronchitis. She also was contemplated going to the emergency room a few days ago but now she is better. There is no obvious fever or chills or hemoptysis or edema paroxysmal nocturnal dyspnea or orthopnea. Pulmonary function test today shows 10% decline in FVC and DLCO compared to baseline.   OV 06/08/2017  Chief Complaint  Patient presents with  . Follow-up    Feeling about the same as last visit. Still having chest tightness and wheezing at times, Sounds hoarse.     Follow-up interstitial lung disease due to autoimmune processes not otherwise specified. On CellCept and prednisone since April 2016. Not on Bactrim prophylaxis due to G6PD deficiency. Normal Right heart cath Nov 2016.  Last high-resolution CT November 2017  Last visit August 2018.  There is a routine follow-up.  Overall she feels stable.  FVC shows stability since August 2018 but declined since 1 year ago.  DLCO shows continued decline.  Though her lung health is stable.  She says she lost her 1 only biological sister last week due to lupus.  The sister was only 85 and lived in [place].  There are no new issues. Walking desaturation test on 06/08/2017 185 feet x 3 laps on ROOM AIR:  did not desaturate. Rest pulse ox was 100%%, final pulse ox was 100%. HR response 81/min at rest to 109/min at peak  exertion.  Patient JETTE LEWAN  Did not Desaturate < 88% . Judye Bos did nto  Desaturated </= 3% points. MARYLOUISE MALLET yes did get tachyardic   OV 10/15/2017   Follow-up interstitial lung disease due to autoimmune processes not otherwise specified. On CellCept and prednisone since April 2016. Not on Bactrim prophylaxis due to G6PD deficiency. Normal Right heart cath Nov 2016 and feb 2019.  Last high-resolution CT November 2017 and dec 2018 without progresion   Last visit December 2018.  This is a routine follow-up.  In the interim she had high-resolution CT scan of the chest that did not show any progression in interstitial lung disease between 2017 and 2018.Marland Kitchen  Therefore we did an echocardiogram that showed slight elevation in pulmonary artery systolic pressure.  Therefore we sent it to her repeat right heart catheterization and this was normal as documented below.  Overall she feels stable compared to one year ago but has declined compared to 2 years ago.  She is also complaining about a new recurrence of cough that happens mostly at night despite Symbicort and prednisone and CellCept.  It wakes her up.  It is moderate in intensity.  There is no associated wheezing or edema orthopnea.  It happens randomly.  There is associated heartburn.  She is on nothing for acid reflux.       Right Heart Pressures 08/20/17 [facility] Procedural Findings: Hemodynamics (mmHg) RA mean 3 RV 30/6 PA 23/8, mean 14 PCWP mean 8  Oxygen saturations: PA 72% AO 98%  Cardiac Output (Fick) 6.73  Cardiac Index (Fick) 3.06  Cardiac Output (Thermo) 6.94 Cardiac Index (Thermo) 3.15    OV 12/01/2017  Chief Complaint  Patient presents with  . Follow-up    Pt has SOB with exertion, wheezing, some chest tightness. Pt was dry cough more at bedtime.      Follow-up interstitial lung disease due to autoimmune processes not otherwise specified. On CellCept and prednisone since April 2016. Not on Bactrim prophylaxis due to  G6PD deficiency. Normal Right heart cath Nov 2016 and feb 2019.  Last high-resolution CT November 2017 and dec 2018 without progresion   This visit is to see if she is got progressive interstitial lung disease.  Last visit she was complaining of more cough.  Therefore we added some acid reflux treatment.  She says after the acid reflux treatment symptoms have actually improved.  This contradicts what she told the CMA at intake.  Overall she is feeling stable.  However she does have a new episode of orthostatic dizziness going on at a mild level for the last 1 week.  Today after doing pulmonary function test she did feel dizzy.  Therefore we checked orthostatics on her and she got tachycardic standing up and her blood pressure did drop although still within normal limits.  She does not have any chest pain.  Lung function test shows continued stability in the last 1 year including compared to the most recent attempt.  OV 03/08/2018  Subjective:  Patient ID: Judye Bos, female , DOB: Oct 14, 1970 , age 7 y.o. , MRN: [id number] , ADDRESS: [address]  [place] [facility] [address]   03/08/2018 -   Chief Complaint  Patient presents with  . Follow-up    Spirometry performed today. Pt states she is still having some mild problems with dizziness but not as bad as last visit. Pt also states she has been having problems with headaches x1 month. Pt states her breathing is  about the same as last visit and also states she still has the dry cough.    Follow-up interstitial lung disease due to autoimmune processes not otherwise specified. On CellCept and prednisone since April 2016. Not on Bactrim prophylaxis due to G6PD deficiency. Normal Right heart cath Nov 2016 and feb 2019.  Last high-resolution CT November 2017 and dec 2018 without progresion     HPI LAILANY ENOCH 49 y.o. - connective tissue disease ILD. Follow-up. Last seen June 2019 by she started having new onset dizziness. It seemed orthostatic. She  says since then it is spontaneously improved but still persists. She is also dealing with fatigue issues. In 02/26/2018 she felt she had a lupus flare saw Dr. Algis Downs and given a prednisone burst. Autoimmune profile at that time showed continued positive ANA titer but normal complements. She had a CBC that showed a drop in hemoglobin by 1 g percent. Her baseline is around 11.5 g percent and currently it is around 10.5 g percent. She says after the prednisone burst a lupus flare symptoms and fatigue have improved although it still persists. If it is actually worse in the last few months. She had pulmonary function test that shows continued improvement especially following the recent prednisone burst. Walking desaturation test was normal other than tachycardia. We observed her to be walking very slowly in a very deconditioned and fatigued fashion. 02/26/2089 and liver function and renal function reviewed and this was normal. Medication review shows she is on oxycodone and gabapentin for back pain. She says she's been on gabapentin for the last 1 year. It appears that the temporal sequence of fatigue and dizziness might be related to gabapentin but she is not so sure.       Results for TRUDI, MORGENTHALER (MRN [id number]) as of 03/08/2018 10:51  Ref. Range 02/26/2018 10:53  Hemoglobin Latest Ref Range: 11.7 - 15.5 g/dL 40.3 (L)  Results for MALAINA, MORTELLARO (MRN [id number]) as of 03/08/2018 10:51  Ref. Range 02/26/2018 10:53  Creatinine Latest Ref Range: 0.50 - 1.10 mg/dL 8.75  Results for DAVEIGH, BATTY (MRN [id number]) as of 03/08/2018 10:51  Ref. Range 02/26/2018 10:53  AST Latest Ref Range: 10 - 35 U/L 19  ALT Latest Ref Range: 6 - 29 U/L 18   Results for MARLISA, CARIDI (MRN [id number]) as of 03/08/2018 10:51  Ref. Range 02/26/2018 10:53  Anti Nuclear Antibody(ANA) Latest Ref Range: NEGATIVE  POSITIVE (A)  ANA Pattern 1 Unknown SPECKLED (A)  ANA Titer 1 Latest Units: titer > OR = 1:1280 (A)  ds DNA Ab Latest Units:  IU/mL <1  C3 Complement Latest Ref Range: 83 - 193 mg/dL 160  C4 Complement Latest Ref Range: 15 - 57 mg/dL 33    ROS - per HPI    OV 06/03/2018  Subjective:  Patient ID: Judye Bos, female , DOB: 12/22/1970 , age 58 y.o. , MRN: [id number] , ADDRESS: [address]  [place] [facility] [address]   06/03/2018 -   Chief Complaint  Patient presents with  . Follow-up    pt states breathing is baseline. c/o sob with exertion, non prod cough & wheezing    Follow-up interstitial lung disease due to autoimmune processes not otherwise specified. On CellCept and prednisone since April 2016. Not on Bactrim prophylaxis due to G6PD deficiency. Normal Right heart cath Nov 2016 and feb 2019.  Last high-resolution CT November 2017 and dec 2018 without progresion   HPI DYLYNN KETNER 49 y.o. -presents  for follow-up.  Last visit September 2019.  At that time CBC showed anemia.  We repeated the hemoglobin 1 month later and was stable around 10 g%.  She tells me that overall she is stable.  Last visit she had dizziness and I told her to stop gabapentin which she did and her dizziness and fatigue have improved.  She feels she is stable but in the last 3 days she has had a dry cough but no fever or congestion or wheezing or hemoptysis or edema.  No worsening shortness of breath or chest tightness.  Other than that she feels good.  She did up walking desaturation test today and it appears similar to previous visit.       OV 05/13/2019  Subjective:  Patient ID: Judye Bos, female , DOB: 1970/06/30 , age 24 y.o. , MRN: [id number] , ADDRESS: [address] [place] [address]   05/13/2019 -   Chief Complaint  Patient presents with  . Follow-up    Pt just had total left knee replacement mid October 2020. Pt said she still has problems with SOB especially with going upstairs. Pt also has occ wheezing but denies any real complaints of cough.   Follow-up interstitial lung disease due to autoimmune  processes not otherwise specified. On CellCept and prednisone since April 2016. Not on Bactrim prophylaxis due to G6PD deficiency. Normal Right heart cath Nov 2016 and feb 2019.  Last high-resolution CT November 2017 and dec 2018 without progression. Last PFT Sept 2019  HPI LEYAH STICKLES 49 y.o. -returns for follow-up.  Last seen in September 2019 and because of the pandemic things got delayed.  She was supposed to see me in 6 months with spirometry and high-resolution CT chest for progression but this did not happen.  At this follow-up we do not have those data points as yet.  She tells me the interim she has had knee surgery and therefore she is not able to do a simple walk test with [place].  She has gained some weight.  Also in the interim she is now on Social Security disability and she is unemployed now.  She feels her shortness of breath is slightly worse particularly when walking up stairs but otherwise overall she feels stable.  I reviewed her blood work.  And it seems anemia is worse in the 9 g%.  She really denies any bleeding.  Normally she runs between 10 and 11 g%.  I noticed that she still not had a shingles vaccine.   OV 01/09/2020   Subjective:  Patient ID: Judye Bos, female , DOB: 05-29-1971, age 48 y.o. years. , MRN: [id number],  ADDRESS: [address] [place] [address] PCP  Eunice Blase, PA-C Providers : Treatment Team:  Attending Provider: Kalman Shan, MD    Follow-up interstitial lung disease due to autoimmune processes not otherwise specified. On CellCept and prednisone since April 2016. Not on Bactrim prophylaxis due to G6PD deficiency. Normal Right heart cath Nov 2016 and feb 2019.  Last high-resolution CT November 2017 and dec 2018 and dec 2020 without progression. Last PFT Sept 2019 and now June 2021  Chief Complaint  Patient presents with  . Follow-up       HPI JEHIELI BERNABEI 48 y.o. -returns for follow-up.  Since her last visit she continues  to be stable.  She is only on CellCept.  She is also on Plaquenil.  She is not on prednisone or antifibrotic's.  Her dyspnea is stable.  Her pulmonary function test is improved and a CT chest shows stability.  The main issue is that she says over the last 3 years she has gained 30 to 40 pounds of weight.  She says that she is snoring a lot according to her husband.  Apparently the husband's also noticed apneic spells.  She says she has had the Covid vaccine but is not documented in our chart.  She is yet to have the Shingrix vaccine.     SYMPTOM SCALE - ILD 05/13/2019   O2 use RA  Shortness of Breath 0 -> 5 scale with 5 being worst (score 6 If unable to do)  At rest 2  Simple tasks - showers, clothes change, eating, shaving 3  Household (dishes, doing bed, laundry) 3  Shopping 3  Walking level at own pace 2  Walking keeping up with others of same age 80  Walking up Stairs 5  Walking up Hill 5  Total (40 - 48) Dyspnea Score 29  How bad is your cough? 0  How bad is your fatigue 3       Results for LARETA, BRUNEAU (MRN [id number]) as of 02/13/2017 12:16  Ref. Range 10/09/2014 09:39 03/28/2015 11:40 10/12/2015 13:42 05/06/2016 10:38 02/13/2017 10:49 06/08/2017  12/01/2017  03/08/2018  11/24/19  FVC-Pre Latest Units: L 2.54 2.55 2.53 2.63 2.32 2.31 2.38 2.46 2.67  FVC-%Pred-Pre Latest Units: % 64 64 64 67 59 59% 61% 67% 73%   Results for ELLANOR, FEUERSTEIN (MRN [id number]) as of 02/13/2017 12:16  Ref. Range 10/09/2014 09:39 03/28/2015 11:40 10/12/2015 13:42 05/06/2016 10:38 02/13/2017 10:49 06/08/2017  12/01/2017  03/08/2018  11/24/19  DLCO unc Latest Units: ml/min/mmHg 15.03 14.50 18.36 18.31 16.35 14.21 16.38 x 18.56  DLCO unc % pred Latest Units: % 43 42 53 53 47 41 47% x 72%       Simple office walk 185 feet x  3 laps goal with forehead probe 03/08/2018  06/03/2018   O2 used Room air Room iar  Number laps completed 3 3 x 250 feet  Comments about pace Slow pace normal  Resting Pulse Ox/HR  100% and 96/min 100% and 76  Final Pulse Ox/HR 100% and 112/min 96% and 121  Desaturated </= 88% n no  Desaturated <= 3% points no Yes, 4 points  Got Tachycardic >/= 90/min yes yes  Symptoms at end of test Mild fatigue Mild dyspnea  Miscellaneous comments Very slow pace     ROS - per HPI Results for DANAIJA, ESKRIDGE (MRN [id number]) as of 01/09/2020 16:33  Ref. Range 12/21/2019 22:12  Creatinine Latest Ref Range: 0.44 - 1.00 mg/dL 2.84  Results for JEANNIFER, DRAKEFORD (MRN [id number]) as of 01/09/2020 16:33  Ref. Range 12/21/2019 22:12  Hemoglobin Latest Ref Range: 12.0 - 15.0 g/dL 72.5 (L)   IMPRESSION: dec 2020 compared to 2018 1. The appearance of the lungs remains compatible with interstitial lung disease, with a spectrum of findings considered indeterminate for usual interstitial pneumonia (UIP) per current ats guidelines. However, given the spectrum of findings and the stability of the findings compared to the prior study, this is once again strongly favored to represent nonspecific interstitial pneumonia (NSIP).   Electronically Signed   By: Trudie Reed M.D.   On: 06/15/2019 09:42 ROS - per HPI     has a past medical history of Anemia, Anxiety, Dyspnea, G6PD deficiency, ILD (interstitial lung disease) (HCC), Lupus (HCC), and Rheumatoid arthritis (HCC).  reports that she quit smoking about 5 years ago. Her smoking use included cigarettes. She has a 7.00 pack-year smoking history. She has never used smokeless tobacco.  Past Surgical History:  Procedure Laterality Date  . ABDOMINAL HYSTERECTOMY    . APPENDECTOMY  1980  . cardiac catherization  08/20/2017  . CARDIAC CATHETERIZATION N/A 05/10/2015   Procedure: Right Heart Cath;  Surgeon: Laurey Morale, MD;  Location: [facility] INVASIVE CV LAB;  Service: Cardiovascular;  Laterality: N/A;  . CESAREAN SECTION  '95, '02, '07   X 3  . CHOLECYSTECTOMY  2010  . LAPAROSCOPIC HYSTERECTOMY  10/2015   have ovaries  . RIGHT HEART CATH  N/A 08/20/2017   Procedure: RIGHT HEART CATH;  Surgeon: Laurey Morale, MD;  [facility] INVASIVE CV LAB;  Service: Cardiovascular;  Laterality: N/A;  . TOTAL KNEE ARTHROPLASTY Left 04/04/2019   Procedure: TOTAL KNEE ARTHROPLASTY;  Surgeon: Ollen Gross, MD;  Location: [facility] ORS;  Service: Orthopedics;  Laterality: Left;   . TUBAL LIGATION      Allergies  Allergen Reactions  . Asa [Aspirin] Hives and Shortness Of Breath    Chest tightness   . Penicillins Hives    Has patient had a PCN reaction causing immediate rash, facial/tongue/throat swelling, SOB or lightheadedness with hypotension: Unknown Has patient had a PCN reaction causing severe rash involving mucus membranes or skin necrosis: Unknown Has patient had a PCN reaction that required hospitalization: Unknown Has patient had a PCN reaction occurring within the last 10 years: No If all of the above answers are "NO", then may proceed with Cephalosporin use.   . Sulfa Antibiotics     G6PD deficiency     Immunization History  Administered Date(s) Administered  . Influenza Split 03/23/2014  . Influenza,inj,Quad PF,6+ Mos 03/15/2016, 06/08/2017, 03/08/2018, 02/17/2019  . Pneumococcal-Unspecified 08/23/2014    Family History  Problem Relation Age of Onset  . Sarcoidosis Mother   . Lupus Sister   . Healthy Daughter   . Healthy Son   . Healthy Son      Current Outpatient Medications:  .  albuterol (VENTOLIN HFA) 108 (90 Base) MCG/ACT inhaler, Inhale 2 puffs into the lungs every 6 (six) hours as needed for wheezing or shortness of breath., Disp: 1 Inhaler, Rfl: 5 .  buPROPion (WELLBUTRIN XL) 150 MG 24 hr tablet, Take 150 mg by mouth daily. , Disp: , Rfl: 1 .  busPIRone (BUSPAR) 10 MG tablet, Take 10 mg by mouth daily., Disp: , Rfl:  .  famotidine (PEPCID) 20 MG tablet, Take 20 mg by mouth at bedtime., Disp: , Rfl:  .  ferrous sulfate 325 (65 FE) MG tablet, Take 650 mg by mouth daily with breakfast., Disp: , Rfl:  .   guaiFENesin-codeine 100-10 MG/5ML syrup, Take 5 mLs by mouth every 6 (six) hours as needed for cough., Disp: 120 mL, Rfl: 0 .  hydroxychloroquine (PLAQUENIL) 200 MG tablet, Take 400 mg by mouth daily. , Disp: , Rfl:  .  lidocaine (LIDODERM) 5 %, Place 1 patch onto the skin daily. Remove & Discard patch within 12 hours or as directed by MD, Disp: 30 patch, Rfl: 0 .  Magnesium Oxide (MAG-200) 200 MG TABS, Take 200 mg by mouth at bedtime., Disp: , Rfl:  .  methocarbamol (ROBAXIN) 500 MG tablet, Take 1 tablet (500 mg total) by mouth 2 (two) times daily., Disp: 20 tablet, Rfl: 0 .  mycophenolate (CELLCEPT) 500 MG tablet, Take 1 tablet (500 mg total) by mouth  2 (two) times daily. (Patient taking differently: Take 1,000 mg by mouth 2 (two) times daily. ), Disp: 360 tablet, Rfl: 1 .  Oxycodone HCl 10 MG TABS, Take 10 mg by mouth 2 (two) times daily as needed for pain., Disp: , Rfl:  .  QUEtiapine (SEROQUEL) 200 MG tablet, Take 200 mg by mouth at bedtime. , Disp: , Rfl:  .  sertraline (ZOLOFT) 100 MG tablet, Take 150 mg by mouth at bedtime. , Disp: , Rfl: 0      Objective:   Vitals:   01/09/20 1705  BP: 134/84  Pulse: (!) 102  Temp: 98.9 F (37.2 C)  TempSrc: Oral  SpO2: 100%  Weight: 286 lb (129.7 kg)  Height: 5\' 11"  (1.803 m)    Estimated body mass index is 39.89 kg/m as calculated from the following:   Height as of this encounter: 5\' 11"  (1.803 m).   Weight as of this encounter: 286 lb (129.7 kg).  @WEIGHTCHANGE @  [employer]   01/09/20 1705  Weight: 286 lb (129.7 kg)     Physical Exam Tall obese lady with basal crackles.  Alert and oriented x3 otherwise exam is nonfocal.        Assessment:       ICD-10-CM   1. Interstitial lung disease due to connective tissue disease (HCC)  J84.89 Pulmonary Function Test   M35.9   2. High risk medication use  Z79.899   3. History of snoring  Z87.898   4. Witnessed apneic spells  R06.81   5. Vaccine counseling  Z71.89         Plan:     Patient Instructions  Interstitial lung disease due to connective tissue disease (HCC) High risk medication use  Stable without change; good news  Plan ' -Continue CellCept through the rheumatologist -Continue Plaquenil through the rheumatologist -Noted that he not on prednisone anymore -If pulmonary fibrosis gets worse then we can add nintedanib -Get spirometry and DLCO in 6 months  History of snoring Witnessed apneic spells  - good chance you have sleep apnea  Plan  =refere sleep doc  Vaccine counseling  PLAN  - CMA to document your covid vaccine status  - Please talk to PCP [name], [name], PA-C -  and ensure you get  shingrix (GSK) inactivated vaccine against shingles  Follow-up -Refer sleep doctor -Spirometry and DLCO in 6 months -Return to see Dr. Marchelle Gearing 30-minute ILD slot in 6 months      SIGNATURE    Dr. Kalman Shan, M.D., F.C.C.P,  Pulmonary and Critical Care Medicine Staff Physician, [facility] Center Director - Interstitial Lung Disease  Program  Pulmonary Fibrosis [facility] [place], [place], [address]  Pager: [phone], If no answer or between  15:00h - 7:00h: call [phone] Telephone: [phone]  5:48 PM 01/09/2020

## 2020-01-18 ENCOUNTER — Telehealth: Payer: Self-pay | Admitting: Internal Medicine

## 2020-01-18 ENCOUNTER — Encounter: Payer: Self-pay | Admitting: Pulmonary Disease

## 2020-01-18 ENCOUNTER — Ambulatory Visit (INDEPENDENT_AMBULATORY_CARE_PROVIDER_SITE_OTHER): Payer: Medicare HMO | Admitting: Pulmonary Disease

## 2020-01-18 ENCOUNTER — Other Ambulatory Visit: Payer: Self-pay

## 2020-01-18 VITALS — BP 130/70 | HR 96 | Temp 98.4°F | Ht 71.0 in | Wt 290.0 lb

## 2020-01-18 DIAGNOSIS — G4733 Obstructive sleep apnea (adult) (pediatric): Secondary | ICD-10-CM | POA: Diagnosis not present

## 2020-01-18 NOTE — Progress Notes (Signed)
Shannon [name]    [id number]    1971/01/30  Primary Care Physician:[name], Pauline Good  Referring Physician: Eunice Blase, PA-C [address] [place],  [place] [address]  Chief complaint:   Patient with a history of snoring, nonrestorative sleep Multiple awakenings  HPI:  Has been told about snoring and she does have difficulty falling asleep, difficulty maintaining sleep Wakes up with a cough She does have a history of interstitial lung disease, lupus Follows up with Dr. 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 albuterol (VENTOLIN HFA) 108 (90 Base) MCG/ACT inhaler Inhale 2 puffs into the lungs every 6 (six) hours as needed for wheezing or shortness of breath.  Marland Kitchen buPROPion (WELLBUTRIN XL) 150 MG 24 hr tablet Take 150 mg by mouth daily.   . busPIRone (BUSPAR) 10 MG tablet Take 10 mg by mouth daily.  . famotidine (PEPCID) 20 MG tablet Take 20 mg by mouth at bedtime.  . ferrous sulfate 325 (65 FE) MG tablet Take 650 mg by mouth daily with breakfast.  . guaiFENesin-codeine 100-10 MG/5ML syrup Take 5 mLs by mouth every 6 (six) hours as needed for cough.  . hydroxychloroquine (PLAQUENIL) 200 MG tablet Take 400 mg by mouth daily.   Marland Kitchen lidocaine (LIDODERM) 5 % Place 1 patch onto the skin daily. Remove & Discard  patch within 12 hours or as directed by MD  . Magnesium Oxide (MAG-200) 200 MG TABS Take 200 mg by mouth at bedtime.  . methocarbamol (ROBAXIN) 500 MG tablet Take 1 tablet (500 mg total) by mouth 2 (two) times daily.  . mycophenolate (CELLCEPT) 500 MG tablet Take 1 tablet (500 mg total) by mouth 2 (two) times daily. (Patient taking differently: Take 1,000 mg by mouth 2 (two) times daily. )  . Oxycodone HCl 10 MG TABS Take 10 mg by mouth 2 (two) times daily as needed for pain.  Marland Kitchen QUEtiapine (SEROQUEL) 200 MG tablet Take 200 mg by mouth at bedtime.   . sertraline (ZOLOFT) 100 MG tablet Take 150 mg by mouth at bedtime.    No facility-administered encounter medications on file as of 01/18/2020.    Allergies as of 01/18/2020 - Review Complete 01/18/2020  Allergen Reaction Noted  . Asa [aspirin] Hives and Shortness Of Breath   . Penicillins Hives 12/18/2015  . Sulfa antibiotics      Past Medical History:  Diagnosis Date  . Anemia   . Anxiety   . Dyspnea   . G6PD deficiency   . ILD (interstitial lung disease) (HCC)   . Lupus (HCC)   . Rheumatoid arthritis [facility])     Past Surgical History:  Procedure Laterality Date  . ABDOMINAL HYSTERECTOMY    . APPENDECTOMY  1980  . cardiac catherization  08/20/2017  .  CARDIAC CATHETERIZATION N/A 05/10/2015   Procedure: Right Heart Cath;  Surgeon: Laurey Morale, MD;  Location: [facility] INVASIVE CV LAB;  Service: Cardiovascular;  Laterality: N/A;  . CESAREAN SECTION  '95, '02, '07   X 3  . CHOLECYSTECTOMY  2010  . LAPAROSCOPIC HYSTERECTOMY  10/2015   have ovaries  . RIGHT HEART CATH N/A 08/20/2017   Procedure: RIGHT HEART CATH;  Surgeon: Laurey Morale, MD;  Location: [facility] INVASIVE CV LAB;  Service: Cardiovascular;  Laterality: N/A;  . TOTAL KNEE ARTHROPLASTY Left 04/04/2019   Procedure: TOTAL KNEE ARTHROPLASTY;  Surgeon: Ollen Gross, MD;  Location: [facility] ORS;  Service: Orthopedics;  Laterality: Left;   . TUBAL LIGATION      Family History   Problem Relation Age of Onset  . Sarcoidosis Mother   . Lupus Sister   . Healthy Daughter   . Healthy Son   . Healthy Son     Social History   Socioeconomic History  . Marital status: Married    Spouse name: [name]  . Number of children: 3  . Years of education: 5  . Highest education level: Not on file  Occupational History  . 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 Smoking status: Former Smoker    Packs/day: 0.50    Years: 14.00    Pack years: 7.00    Types: Cigarettes    Quit date: 09/25/2014    Years since quitting: 5.3  . Smokeless tobacco: Never Used  Vaping Use  . Vaping Use: Never used  Substance and Sexual Activity  . Alcohol use: No    Alcohol/week: 0.0 standard drinks  . Drug use: No  . Sexual activity: Not on file  Other Topics Concern  . Not on file  Social History Narrative   Married, lives with husband, children   Caffeine- coffee  1 daily   Social Determinants of Health   Financial Resource Strain:   . Difficulty of Paying Living Expenses:   Food Insecurity:   . Worried About Programme researcher, broadcasting/film/video in the Last Year:   . Barista in the Last Year:   Transportation Needs:   . Freight forwarder (Medical):   Marland Kitchen Lack of Transportation (Non-Medical):   Physical Activity:   . Days of Exercise per Week:   . Minutes of Exercise per Session:   Stress:   . Feeling of Stress :   Social Connections:   . Frequency of Communication with Friends and Family:   . Frequency of Social Gatherings with Friends and Family:   . Attends Religious Services:   . Active Member of Clubs or Organizations:   . Attends Banker Meetings:   Marland Kitchen Marital Status:   Intimate Partner Violence:   . Fear of Current or Ex-Partner:   . Emotionally Abused:   Marland Kitchen Physically Abused:   . Sexually Abused:     Review of Systems  Constitutional: Positive for fatigue.  Respiratory: Positive for apnea  and cough.   Psychiatric/Behavioral: Positive for sleep disturbance.    Vitals:   01/18/20 1143  BP: (!) 130/70  Pulse: 96  Temp: 98.4 F (36.9 C)  SpO2: 96%     Physical Exam Constitutional:      Appearance: She is obese.  HENT:     Head: Normocephalic.     Nose: No congestion.     Mouth/Throat:  Mouth: Mucous membranes are moist.     Comments: Mallampati 4, crowded oropharynx Eyes:     General:        Right eye: No discharge.        Left eye: No discharge.  Cardiovascular:     Rate and Rhythm: Normal rate and regular rhythm.     Pulses: Normal pulses.     Heart sounds: Normal heart sounds. No murmur heard.  No friction rub.  Pulmonary:     Effort: Pulmonary effort is normal. No respiratory distress.     Breath sounds: No stridor. Rales present. No wheezing or rhonchi.  Musculoskeletal:     Cervical back: No rigidity or tenderness.  Neurological:     General: No focal deficit present.     Mental Status: She is alert.  Psychiatric:        Mood and Affect: Mood normal.    Results of the Epworth flowsheet 01/18/2020  Sitting and reading 1  Watching TV 2  Sitting, inactive in a public place (e.g. a theatre or a meeting) 0  As a passenger in a car for an hour without a break 1  Lying down to rest in the afternoon when circumstances permit 1  Sitting and talking to someone 0  Sitting quietly after a lunch without alcohol 2  In a car, while stopped for a few minutes in traffic 0  Total score 7    Data Reviewed: CT from 12/22/2019 reviewed  Assessment:  Moderate probability of significant obstructive sleep apnea  Nonrestorative sleep  Daytime sleepiness  Morbid obesity  Pathophysiology of sleep disordered breathing reviewed with the patient Treatment options for sleep disordered breathing discussed with the patient  Plan/Recommendations: Risks of not treating sleep disordered breathing discussed with the patient  We will schedule the patient for home  sleep study  Weight loss efforts encouraged  Follow-up tentatively in 3 months   Virl Diamond MD [facility] 01/18/2020, 11:59 AM  CC: [name], [name], PA-C

## 2020-01-18 NOTE — Patient Instructions (Signed)
Moderate probability of significant obstructive sleep apnea  We will schedule you for home sleep study We will update you with results of the study  Treatment options for sleep apnea has discussed  I will follow-up with you in 3 months tentatively   Sleep Apnea Sleep apnea is a condition in which breathing pauses or becomes shallow during sleep. Episodes of sleep apnea usually last 10 seconds or longer, and they may occur as many as 20 times an hour. Sleep apnea disrupts your sleep and keeps your body from getting the rest that it needs. This condition can increase your risk of certain health problems, including:  Heart attack.  Stroke.  Obesity.  Diabetes.  Heart failure.  Irregular heartbeat. What are the causes? There are three kinds of sleep apnea:  Obstructive sleep apnea. This kind is caused by a blocked or collapsed airway.  Central sleep apnea. This kind happens when the part of the brain that controls breathing does not send the correct signals to the muscles that control breathing.  Mixed sleep apnea. This is a combination of obstructive and central sleep apnea. The most common cause of this condition is a collapsed or blocked airway. An airway can collapse or become blocked if:  Your throat muscles are abnormally relaxed.  Your tongue and tonsils are larger than normal.  You are overweight.  Your airway is smaller than normal. What increases the risk? You are more likely to develop this condition if you:  Are overweight.  Smoke.  Have a smaller than normal airway.  Are elderly.  Are female.  Drink alcohol.  Take sedatives or tranquilizers.  Have a family history of sleep apnea. What are the signs or symptoms? Symptoms of this condition include:  Trouble staying asleep.  Daytime sleepiness and tiredness.  Irritability.  Loud snoring.  Morning headaches.  Trouble concentrating.  Forgetfulness.  Decreased interest in  sex.  Unexplained sleepiness.  Mood swings.  Personality changes.  Feelings of depression.  Waking up often during the night to urinate.  Dry mouth.  Sore throat. How is this diagnosed? This condition may be diagnosed with:  A medical history.  A physical exam.  A series of tests that are done while you are sleeping (sleep study). These tests are usually done in a sleep lab, but they may also be done at home. How is this treated? Treatment for this condition aims to restore normal breathing and to ease symptoms during sleep. It may involve managing health issues that can affect breathing, such as high blood pressure or obesity. Treatment may include:  Sleeping on your side.  Using a decongestant if you have nasal congestion.  Avoiding the use of depressants, including alcohol, sedatives, and narcotics.  Losing weight if you are overweight.  Making changes to your diet.  Quitting smoking.  Using a device to open your airway while you sleep, such as: ? An oral appliance. This is a custom-made mouthpiece that shifts your lower jaw forward. ? A continuous positive airway pressure (CPAP) device. This device blows air through a mask when you breathe out (exhale). ? A nasal expiratory positive airway pressure (EPAP) device. This device has valves that you put into each nostril. ? A bi-level positive airway pressure (BPAP) device. This device blows air through a mask when you breathe in (inhale) and breathe out (exhale).  Having surgery if other treatments do not work. During surgery, excess tissue is removed to create a wider airway. It is important to get treatment  for sleep apnea. Without treatment, this condition can lead to:  High blood pressure.  Coronary artery disease.  In men, an inability to achieve or maintain an erection (impotence).  Reduced thinking abilities. Follow these instructions at home: Lifestyle  Make any lifestyle changes that your health care  provider recommends.  Eat a healthy, well-balanced diet.  Take steps to lose weight if you are overweight.  Avoid using depressants, including alcohol, sedatives, and narcotics.  Do not use any products that contain nicotine or tobacco, such as cigarettes, e-cigarettes, and chewing tobacco. If you need help quitting, ask your health care provider. General instructions  Take over-the-counter and prescription medicines only as told by your health care provider.  If you were given a device to open your airway while you sleep, use it only as told by your health care provider.  If you are having surgery, make sure to tell your health care provider you have sleep apnea. You may need to bring your device with you.  Keep all follow-up visits as told by your health care provider. This is important. Contact a health care provider if:  The device that you received to open your airway during sleep is uncomfortable or does not seem to be working.  Your symptoms do not improve.  Your symptoms get worse. Get help right away if:  You develop: ? Chest pain. ? Shortness of breath. ? Discomfort in your back, arms, or stomach.  You have: ? Trouble speaking. ? Weakness on one side of your body. ? Drooping in your face. These symptoms may represent a serious problem that is an emergency. Do not wait to see if the symptoms will go away. Get medical help right away. Call your local emergency services (911 in the [place] Do not drive yourself to the hospital. Summary  Sleep apnea is a condition in which breathing pauses or becomes shallow during sleep.  The most common cause is a collapsed or blocked airway.  The goal of treatment is to restore normal breathing and to ease symptoms during sleep. This information is not intended to replace advice given to you by your health care provider. Make sure you discuss any questions you have with your health care provider. Document Revised: 11/24/2018  Document Reviewed: 02/02/2018 Elsevier Patient Education  [place].

## 2020-01-18 NOTE — Telephone Encounter (Signed)
ATC patient.  LMTCB. 

## 2020-01-19 MED ORDER — BREO ELLIPTA 100-25 MCG/INH IN AEPB: 1.0000 | INHALATION_SPRAY | Freq: Every day | RESPIRATORY_TRACT | 2 refills | Status: DC

## 2020-01-19 NOTE — Telephone Encounter (Signed)
Hemoglobin suppressed because most recent visit she said she was doing fine.  She can try a maintenance sample of Breo as empiric treatment for couple of months and then report and tell us how she is doing     Current Outpatient Medications:  .  albuterol (VENTOLIN HFA) 108 (90 Base) MCG/ACT inhaler, Inhale 2 puffs into the lungs every 6 (six) hours as needed for wheezing or shortness of breath., Disp: 1 Inhaler, Rfl: 5 .  buPROPion (WELLBUTRIN XL) 150 MG 24 hr tablet, Take 150 mg by mouth daily. , Disp: , Rfl: 1 .  busPIRone (BUSPAR) 10 MG tablet, Take 10 mg by mouth daily., Disp: , Rfl:  .  famotidine (PEPCID) 20 MG tablet, Take 20 mg by mouth at bedtime., Disp: , Rfl:  .  ferrous sulfate 325 (65 FE) MG tablet, Take 650 mg by mouth daily with breakfast., Disp: , Rfl:  .  guaiFENesin-codeine 100-10 MG/5ML syrup, Take 5 mLs by mouth every 6 (six) hours as needed for cough., Disp: 120 mL, Rfl: 0 .  hydroxychloroquine (PLAQUENIL) 200 MG tablet, Take 400 mg by mouth daily. , Disp: , Rfl:  .  lidocaine (LIDODERM) 5 %, Place 1 patch onto the skin daily. Remove & Discard patch within 12 hours or as directed by MD, Disp: 30 patch, Rfl: 0 .  Magnesium Oxide (MAG-200) 200 MG TABS, Take 200 mg by mouth at bedtime., Disp: , Rfl:  .  methocarbamol (ROBAXIN) 500 MG tablet, Take 1 tablet (500 mg total) by mouth 2 (two) times daily., Disp: 20 tablet, Rfl: 0 .  mycophenolate (CELLCEPT) 500 MG tablet, Take 1 tablet (500 mg total) by mouth 2 (two) times daily. (Patient taking differently: Take 1,000 mg by mouth 2 (two) times daily. ), Disp: 360 tablet, Rfl: 1 .  Oxycodone HCl 10 MG TABS, Take 10 mg by mouth 2 (two) times daily as needed for pain., Disp: , Rfl:  .  QUEtiapine (SEROQUEL) 200 MG tablet, Take 200 mg by mouth at bedtime. , Disp: , Rfl:  .  sertraline (ZOLOFT) 100 MG tablet, Take 150 mg by mouth at bedtime. , Disp: , Rfl: 0

## 2020-01-19 NOTE — Telephone Encounter (Signed)
Pt called back about this, please return call.  

## 2020-01-19 NOTE — Telephone Encounter (Signed)
LMTCB x2 for pt 

## 2020-01-19 NOTE — Telephone Encounter (Signed)
Spoke with Shannon [name]. She is aware of Dr. Jane Canary recommendations. Shannon [name] would like to have a prescription sent in as she lives in [place]. Rx has been sent in. Nothing further was needed.

## 2020-01-19 NOTE — Telephone Encounter (Signed)
Spoke with the pt  She states having increased SOB over the past month-relates somewhat to the heat and humidity  She states also waking up in the night with dry cough  She has to use her albuterol inhaler about 2-3 x per wk  She states that it does not seem to be working as well as it used to and would like to know if she can try another inhaler  Please advise, thanks

## 2020-01-26 DIAGNOSIS — Z96652 Presence of left artificial knee joint: Secondary | ICD-10-CM | POA: Diagnosis not present

## 2020-01-26 DIAGNOSIS — Z1389 Encounter for screening for other disorder: Secondary | ICD-10-CM | POA: Diagnosis not present

## 2020-01-26 DIAGNOSIS — M329 Systemic lupus erythematosus, unspecified: Secondary | ICD-10-CM | POA: Diagnosis not present

## 2020-01-26 DIAGNOSIS — M1711 Unilateral primary osteoarthritis, right knee: Secondary | ICD-10-CM | POA: Diagnosis not present

## 2020-01-26 DIAGNOSIS — F411 Generalized anxiety disorder: Secondary | ICD-10-CM | POA: Diagnosis not present

## 2020-01-26 DIAGNOSIS — G894 Chronic pain syndrome: Secondary | ICD-10-CM | POA: Diagnosis not present

## 2020-01-26 DIAGNOSIS — R69 Illness, unspecified: Secondary | ICD-10-CM | POA: Diagnosis not present

## 2020-01-26 DIAGNOSIS — M25561 Pain in right knee: Secondary | ICD-10-CM | POA: Diagnosis not present

## 2020-01-26 DIAGNOSIS — M545 Low back pain: Secondary | ICD-10-CM | POA: Diagnosis not present

## 2020-02-02 ENCOUNTER — Other Ambulatory Visit: Payer: Self-pay

## 2020-02-02 ENCOUNTER — Ambulatory Visit: Payer: Medicare HMO

## 2020-02-02 DIAGNOSIS — G4733 Obstructive sleep apnea (adult) (pediatric): Secondary | ICD-10-CM

## 2020-02-07 NOTE — Patient Instructions (Addendum)
DUE TO COVID-19 ONLY ONE VISITOR IS ALLOWED TO COME WITH YOU AND STAY IN THE WAITING ROOM ONLY DURING PRE OP AND PROCEDURE DAY OF SURGERY. THE 1 VISITOR  MAY VISIT WITH YOU AFTER SURGERY IN YOUR PRIVATE ROOM DURING VISITING HOURS ONLY!  YOU NEED TO HAVE A COVID 19 TEST ON: 02/16/20@ 12:00 pm, THIS TEST MUST BE DONE BEFORE SURGERY,  COVID TESTING SITE [address] [place] [address], IT IS ON THE RIGHT GOING OUT [address] APPROXIMATELY  2 MINUTES PAST [employer] ON THE RIGHT. ONCE YOUR COVID TEST IS COMPLETED,  PLEASE BEGIN THE QUARANTINE INSTRUCTIONS AS OUTLINED IN YOUR HANDOUT.                Shannon [name]    Your procedure is scheduled on: 02/20/20   Report to [facility] Main  Entrance   Report to admitting at: 7:00 AM     Call this number if you have problems the morning of surgery [phone]    Remember:  NO SOLID FOOD AFTER MIDNIGHT THE NIGHT PRIOR TO SURGERY. NOTHING BY MOUTH EXCEPT CLEAR LIQUIDS UNTIL: 6:30 am . PLEASE FINISH ENSURE DRINK PER SURGEON ORDER  WHICH NEEDS TO BE COMPLETED AT : 6:30 am.   CLEAR LIQUID DIET   Foods Allowed                                                                     Foods Excluded  Coffee and tea, regular and decaf                             liquids that you cannot  Plain Jell-O any favor except red or purple                                           see through such as: Fruit ices (not with fruit pulp)                                     milk, soups, orange juice  Iced Popsicles                                    All solid food Carbonated beverages, regular and diet                                    Cranberry, grape and apple juices Sports drinks like Gatorade Lightly seasoned clear broth or consume(fat free) Sugar, honey syrup  Sample Menu Breakfast                                Lunch  Supper Cranberry juice                    Beef broth                             Chicken broth Jell-O                                     Grape juice                           Apple juice Coffee or tea                        Jell-O                                      Popsicle                                                Coffee or tea                        Coffee or tea  _____________________________________________________________________  BRUSH YOUR TEETH MORNING OF SURGERY AND RINSE YOUR MOUTH OUT, NO CHEWING GUM CANDY OR MINTS.     Take these medicines the morning of surgery with A SIP OF WATER: Cellcept,bupropion,buspar,famotidine.Use inhalers and nasal spray as usual.                               You may not have any metal on your body including hair pins and              piercings  Do not wear jewelry, make-up, lotions, powders or perfumes, deodorant             Do not wear nail polish on your fingernails.  Do not shave  48 hours prior to surgery.                Do not bring valuables to the hospital. [facility] IS NOT             RESPONSIBLE   FOR VALUABLES.  Contacts, dentures or bridgework may not be worn into surgery.  Leave suitcase in the car. After surgery it may be brought to your room.     Patients discharged the day of surgery will not be allowed to drive home. IF YOU ARE HAVING SURGERY AND GOING HOME THE SAME DAY, YOU MUST HAVE AN ADULT TO DRIVE YOU HOME AND BE WITH YOU FOR 24 HOURS. YOU MAY GO HOME BY TAXI OR UBER OR ORTHERWISE, BUT AN ADULT MUST ACCOMPANY YOU HOME AND STAY WITH YOU FOR 24 HOURS.  Name and phone number of your driver:  Special Instructions: N/A              Please read over the following fact sheets you were given: _____________________________________________________________________        [facility] - Preparing for Surgery Before surgery, you can play an important role.  Because skin  is not sterile, your skin needs to be as free of germs as possible.  You can reduce the number of germs on your skin by washing with CHG  (chlorahexidine gluconate) soap before surgery.  CHG is an antiseptic cleaner which kills germs and bonds with the skin to continue killing germs even after washing. Please DO NOT use if you have an allergy to CHG or antibacterial soaps.  If your skin becomes reddened/irritated stop using the CHG and inform your nurse when you arrive at Short Stay. Do not shave (including legs and underarms) for at least 48 hours prior to the first CHG shower.  You may shave your face/neck. Please follow these instructions carefully:  1.  Shower with CHG Soap the night before surgery and the  morning of Surgery.  2.  If you choose to wash your hair, wash your hair first as usual with your  normal  shampoo.  3.  After you shampoo, rinse your hair and body thoroughly to remove the  shampoo.                           4.  Use CHG as you would any other liquid soap.  You can apply chg directly  to the skin and wash                       Gently with a scrungie or clean washcloth.  5.  Apply the CHG Soap to your body ONLY FROM THE NECK DOWN.   Do not use on face/ open                           Wound or open sores. Avoid contact with eyes, ears mouth and genitals (private parts).                       Wash face,  Genitals (private parts) with your normal soap.             6.  Wash thoroughly, paying special attention to the area where your surgery  will be performed.  7.  Thoroughly rinse your body with warm water from the neck down.  8.  DO NOT shower/wash with your normal soap after using and rinsing off  the CHG Soap.                9.  Pat yourself dry with a clean towel.            10.  Wear clean pajamas.            11.  Place clean sheets on your bed the night of your first shower and do not  sleep with pets. Day of Surgery : Do not apply any lotions/deodorants the morning of surgery.  Please wear clean clothes to the hospital/surgery center.  FAILURE TO FOLLOW THESE INSTRUCTIONS MAY RESULT IN THE CANCELLATION OF  YOUR SURGERY PATIENT SIGNATURE_________________________________  NURSE SIGNATURE__________________________________  ________________________________________________________________________   Shannon [name]  An incentive spirometer is a tool that can help keep your lungs clear and active. This tool measures how well you are filling your lungs with each breath. Taking long deep breaths may help reverse or decrease the chance of developing breathing (pulmonary) problems (especially infection) following:  A long period of time when you are unable to move or be active. BEFORE THE PROCEDURE   If the  spirometer includes an indicator to show your best effort, your nurse or respiratory therapist will set it to a desired goal.  If possible, sit up straight or lean slightly forward. Try not to slouch.  Hold the incentive spirometer in an upright position. INSTRUCTIONS FOR USE  1. Sit on the edge of your bed if possible, or sit up as far as you can in bed or on a chair. 2. Hold the incentive spirometer in an upright position. 3. Breathe out normally. 4. Place the mouthpiece in your mouth and seal your lips tightly around it. 5. Breathe in slowly and as deeply as possible, raising the piston or the ball toward the top of the column. 6. Hold your breath for 3-5 seconds or for as long as possible. Allow the piston or ball to fall to the bottom of the column. 7. Remove the mouthpiece from your mouth and breathe out normally. 8. Rest for a few seconds and repeat Steps 1 through 7 at least 10 times every 1-2 hours when you are awake. Take your time and take a few normal breaths between deep breaths. 9. The spirometer may include an indicator to show your best effort. Use the indicator as a goal to work toward during each repetition. 10. After each set of 10 deep breaths, practice coughing to be sure your lungs are clear. If you have an incision (the cut made at the time of surgery), support your  incision when coughing by placing a pillow or rolled up towels firmly against it. Once you are able to get out of bed, walk around indoors and cough well. You may stop using the incentive spirometer when instructed by your caregiver.  RISKS AND COMPLICATIONS  Take your time so you do not get dizzy or light-headed.  If you are in pain, you may need to take or ask for pain medication before doing incentive spirometry. It is harder to take a deep breath if you are having pain. AFTER USE  Rest and breathe slowly and easily.  It can be helpful to keep track of a log of your progress. Your caregiver can provide you with a simple table to help with this. If you are using the spirometer at home, follow these instructions: SEEK MEDICAL CARE IF:   You are having difficultly using the spirometer.  You have trouble using the spirometer as often as instructed.  Your pain medication is not giving enough relief while using the spirometer.  You develop fever of 100.5 F (38.1 C) or higher. SEEK IMMEDIATE MEDICAL CARE IF:   You cough up bloody sputum that had not been present before.  You develop fever of 102 F (38.9 C) or greater.  You develop worsening pain at or near the incision site. MAKE SURE YOU:   Understand these instructions.  Will watch your condition.  Will get help right away if you are not doing well or get worse. Document Released: 10/20/2006 Document Revised: 09/01/2011 Document Reviewed: 12/21/2006 [facility] Patient Information 2014 [place], [place].   ________________________________________________________________________

## 2020-02-08 ENCOUNTER — Other Ambulatory Visit: Payer: Self-pay

## 2020-02-08 ENCOUNTER — Encounter (HOSPITAL_COMMUNITY): Payer: Self-pay

## 2020-02-08 ENCOUNTER — Encounter (HOSPITAL_COMMUNITY)
Admission: RE | Admit: 2020-02-08 | Discharge: 2020-02-08 | Disposition: A | Discharge status: Home / Self Care | Payer: Medicare HMO | Source: Ambulatory Visit | Attending: Orthopedic Surgery | Admitting: Orthopedic Surgery

## 2020-02-08 DIAGNOSIS — Z01812 Encounter for preprocedural laboratory examination: Secondary | ICD-10-CM | POA: Diagnosis not present

## 2020-02-08 HISTORY — DX: Depression, unspecified: F32.A

## 2020-02-08 LAB — CBC
HCT: 36.3 % (ref 36.0–46.0)
Hemoglobin: 11.4 g/dL — ABNORMAL LOW (ref 12.0–15.0)
MCH: 29.9 pg (ref 26.0–34.0)
MCHC: 31.4 g/dL (ref 30.0–36.0)
MCV: 95.3 fL (ref 80.0–100.0)
Platelets: 256 10*3/uL (ref 150–400)
RBC: 3.81 MIL/uL — ABNORMAL LOW (ref 3.87–5.11)
RDW: 13.3 % (ref 11.5–15.5)
WBC: 4.2 10*3/uL (ref 4.0–10.5)
nRBC: 0 % (ref 0.0–0.2)

## 2020-02-08 LAB — COMPREHENSIVE METABOLIC PANEL
ALT: 18 U/L (ref 0–44)
AST: 18 U/L (ref 15–41)
Albumin: 4 g/dL (ref 3.5–5.0)
Alkaline Phosphatase: 93 U/L (ref 38–126)
Anion gap: 10 (ref 5–15)
BUN: 13 mg/dL (ref 6–20)
CO2: 26 mmol/L (ref 22–32)
Calcium: 9 mg/dL (ref 8.9–10.3)
Chloride: 106 mmol/L (ref 98–111)
Creatinine, Ser: 0.89 mg/dL (ref 0.44–1.00)
GFR calc Af Amer: >60 mL/min (ref >60)
GFR calc non Af Amer: >60 mL/min (ref >60)
Glucose, Bld: 104 mg/dL — ABNORMAL HIGH (ref 70–99)
Potassium: 3.6 mmol/L (ref 3.5–5.1)
Sodium: 142 mmol/L (ref 135–145)
Total Bilirubin: 0.5 mg/dL (ref 0.3–1.2)
Total Protein: 7.6 g/dL (ref 6.5–8.1)

## 2020-02-08 LAB — APTT: aPTT: 27 seconds (ref 24–36)

## 2020-02-08 LAB — TYPE AND SCREEN
ABO/RH(D): A POS
Antibody Screen: NEGATIVE

## 2020-02-08 LAB — PROTIME-INR
INR: 1 (ref 0.8–1.2)
Prothrombin Time: 12.5 seconds (ref 11.4–15.2)

## 2020-02-08 LAB — SURGICAL PCR SCREEN
MRSA, PCR: NEGATIVE
Staphylococcus aureus: NEGATIVE

## 2020-02-08 NOTE — Progress Notes (Signed)
COVID Vaccine Completed:yes Date COVID Vaccine completed:11/05/19 COVID vaccine manufacturer: *[employer]    [facility]   PCP - [name] O'Bunch. PA Cardiologist - NO  Chest x-ray - 12/21/19 EKG -  Stress Test -  ECHO - 07/15/17 Cardiac Cath -   Sleep Study -  CPAP -   Fasting Blood Sugar -  Checks Blood Sugar _____ times a day  Blood Thinner Instructions: Aspirin Instructions: Last Dose:  Anesthesia review:   Patient denies shortness of breath, fever, cough and chest pain at PAT appointment   Patient verbalized understanding of instructions that were given to them at the PAT appointment. Patient was also instructed that they will need to review over the PAT instructions again at home before surgery.

## 2020-02-13 ENCOUNTER — Telehealth: Payer: Self-pay | Admitting: Pulmonary Disease

## 2020-02-13 DIAGNOSIS — G4733 Obstructive sleep apnea (adult) (pediatric): Secondary | ICD-10-CM

## 2020-02-13 NOTE — Telephone Encounter (Signed)
Called and left message for patient to return call.  

## 2020-02-13 NOTE — Telephone Encounter (Signed)
Call patient  Sleep study result  Date of study: 02/02/2020  Impression: Mild obstructive sleep apnea  Recommendation: Recommend CPAP therapy for mild obstructive sleep apnea Auto titrating CPAP with pressure settings of 5-15 will be appropriate

## 2020-02-14 NOTE — Telephone Encounter (Signed)
Spoke with the pt and notified of recs per Dr Wynona Neat  She verbalized understanding  CPAP ordered and appt for f/u scheduled

## 2020-02-14 NOTE — Telephone Encounter (Signed)
Patient is returning phone call. Patient phone number is [phone].

## 2020-02-16 ENCOUNTER — Other Ambulatory Visit (HOSPITAL_COMMUNITY): Payer: Medicare HMO

## 2020-02-17 DIAGNOSIS — M25561 Pain in right knee: Secondary | ICD-10-CM | POA: Diagnosis not present

## 2020-02-17 DIAGNOSIS — Z96652 Presence of left artificial knee joint: Secondary | ICD-10-CM | POA: Diagnosis not present

## 2020-02-17 DIAGNOSIS — M329 Systemic lupus erythematosus, unspecified: Secondary | ICD-10-CM | POA: Diagnosis not present

## 2020-02-17 DIAGNOSIS — M545 Low back pain: Secondary | ICD-10-CM | POA: Diagnosis not present

## 2020-02-17 DIAGNOSIS — M1711 Unilateral primary osteoarthritis, right knee: Secondary | ICD-10-CM | POA: Diagnosis not present

## 2020-02-17 DIAGNOSIS — G894 Chronic pain syndrome: Secondary | ICD-10-CM | POA: Diagnosis not present

## 2020-02-20 ENCOUNTER — Encounter (HOSPITAL_COMMUNITY): Admission: RE | Payer: Self-pay | Source: Home / Self Care

## 2020-02-20 ENCOUNTER — Ambulatory Visit (HOSPITAL_COMMUNITY): Admission: RE | Admit: 2020-02-20 | Payer: Medicare HMO | Source: Home / Self Care | Admitting: Orthopedic Surgery

## 2020-02-20 SURGERY — ARTHROPLASTY, KNEE, TOTAL
Anesthesia: Choice | Site: Knee | Laterality: Right

## 2020-02-29 NOTE — Telephone Encounter (Signed)
[facility], please see mychart messages from pt and advise. Order was placed 02/14/20 for pt to receive CPAP machine.

## 2020-02-29 NOTE — Telephone Encounter (Signed)
Called [facility] & spoke to [place].  She states when she tried to pull sleep study from epic it came over blurry??  I just faxed study to her.  Will route message to triage so nurse can make pt aware thru MyChart that study has been sent to [facility].

## 2020-03-16 DIAGNOSIS — Z79891 Long term (current) use of opiate analgesic: Secondary | ICD-10-CM | POA: Diagnosis not present

## 2020-03-16 DIAGNOSIS — Z96652 Presence of left artificial knee joint: Secondary | ICD-10-CM | POA: Diagnosis not present

## 2020-03-16 DIAGNOSIS — M25561 Pain in right knee: Secondary | ICD-10-CM | POA: Diagnosis not present

## 2020-03-16 DIAGNOSIS — M545 Low back pain: Secondary | ICD-10-CM | POA: Diagnosis not present

## 2020-03-16 DIAGNOSIS — E669 Obesity, unspecified: Secondary | ICD-10-CM | POA: Diagnosis not present

## 2020-03-16 DIAGNOSIS — M1711 Unilateral primary osteoarthritis, right knee: Secondary | ICD-10-CM | POA: Diagnosis not present

## 2020-03-16 DIAGNOSIS — G894 Chronic pain syndrome: Secondary | ICD-10-CM | POA: Diagnosis not present

## 2020-03-26 ENCOUNTER — Emergency Department (HOSPITAL_COMMUNITY): Payer: Medicare HMO

## 2020-03-26 ENCOUNTER — Encounter (HOSPITAL_COMMUNITY): Payer: Self-pay | Admitting: Emergency Medicine

## 2020-03-26 ENCOUNTER — Other Ambulatory Visit: Payer: Self-pay

## 2020-03-26 DIAGNOSIS — F411 Generalized anxiety disorder: Secondary | ICD-10-CM | POA: Diagnosis not present

## 2020-03-26 DIAGNOSIS — Z87891 Personal history of nicotine dependence: Secondary | ICD-10-CM | POA: Insufficient documentation

## 2020-03-26 DIAGNOSIS — L03012 Cellulitis of left finger: Secondary | ICD-10-CM | POA: Diagnosis not present

## 2020-03-26 DIAGNOSIS — Z955 Presence of coronary angioplasty implant and graft: Secondary | ICD-10-CM | POA: Diagnosis not present

## 2020-03-26 DIAGNOSIS — R69 Illness, unspecified: Secondary | ICD-10-CM | POA: Diagnosis not present

## 2020-03-26 DIAGNOSIS — Z96652 Presence of left artificial knee joint: Secondary | ICD-10-CM | POA: Insufficient documentation

## 2020-03-26 DIAGNOSIS — M79645 Pain in left finger(s): Secondary | ICD-10-CM | POA: Diagnosis not present

## 2020-03-26 DIAGNOSIS — M7989 Other specified soft tissue disorders: Secondary | ICD-10-CM | POA: Diagnosis not present

## 2020-03-26 NOTE — ED Triage Notes (Signed)
Pt reports 1 week ago started having pain to her index finger left hand that has now began to swell and become increasingly painful

## 2020-03-27 ENCOUNTER — Emergency Department (HOSPITAL_COMMUNITY)
Admission: EM | Admit: 2020-03-27 | Discharge: 2020-03-27 | Disposition: A | Discharge status: Home / Self Care | Payer: Medicare HMO | Attending: Emergency Medicine | Admitting: Emergency Medicine

## 2020-03-27 DIAGNOSIS — L03012 Cellulitis of left finger: Secondary | ICD-10-CM

## 2020-03-27 MED ORDER — DOXYCYCLINE HYCLATE 100 MG PO CAPS: 100.0000 mg | ORAL_CAPSULE | Freq: Two times a day (BID) | ORAL | 0 refills | Status: DC

## 2020-03-27 MED ORDER — LIDOCAINE HCL (PF) 1 % IJ SOLN
10.0000 mL | Freq: Once | INTRAMUSCULAR | Status: AC
  Administered 2020-03-27: 10 mL
  Filled 2020-03-27: qty 30

## 2020-03-27 NOTE — Discharge Instructions (Addendum)
You were seen in the emergency department for a paronychia please see the attached handout for further information regarding this diagnoses. This area was incised and drained to help release the bacteria. We would like you to apply warm compresses and warm flushes to this area 4-5 times per day to help facilitate further draining as needed. We are also starting you on doxycycline, an antibiotic, in order to help treat the infection.   We have prescribed you new medication(s) today. Discuss the medications prescribed today with your pharmacist as they can have adverse effects and interactions with your other medicines including over the counter and prescribed medications. Seek medical evaluation if you start to experience new or abnormal symptoms after taking one of these medicines, seek care immediately if you start to experience difficulty breathing, feeling of your throat closing, facial swelling, or rash as these could be indications of a more serious allergic reaction Please take tylenol per over the counter dosing as needed for pain.   We would like you to have this area rechecked within 48 hours- please return to the ER , go to an urgent care, or see your primary care provider for this. Return to the ER sooner for new or worsening symptoms including, but not limited to increased pain, increased swelling, spreading redness, fevers, inability to keep fluids down, or any other concerns that you may have.

## 2020-03-27 NOTE — ED Provider Notes (Signed)
[facility]-EMERGENCY DEPT Provider Note   CSN: [id number] Arrival date & time: 03/26/20  2050     History Chief Complaint  Patient presents with  . Hand Pain    Shannon [name] is a 49 y.o. female with a history of anxiety, depression, anemia, lupus, rheumatoid arthritis, and osteoarthritis who presents to the emergency department with complaints of pain to her left 2nd finger x 1 week. Patient states she had a manicure done about a week ago, shortly after started to have pain around her L 2nd nail. She had the acrylic nail removed, however continued to have discomfort and swelling to this area. No alleviating/aggravating factors. No intervention PTA. No hx of same. No other fingers effected. Denies fever, chills, drainage, numbness, tingling, or weakness.   HPI     Past Medical History:  Diagnosis Date  . Anemia   . Anxiety   . Depression   . Dyspnea    Sometimes at rest  . G6PD deficiency   . ILD (interstitial lung disease) (HCC)   . Lupus (HCC)   . Rheumatoid arthritis [facility])     Patient Active Problem List   Diagnosis Date Noted  . OA (osteoarthritis) of knee 04/04/2019  . Acute bronchitis 02/13/2017  . Cough variant asthma  vs uacs  12/17/2016  . Morbid obesity due to excess calories (HCC) 12/17/2016  . Contracture of left elbow 11/21/2016  . Primary osteoarthritis of both knees 11/21/2016  . Pain in right hand 09/09/2016  . Pain in left hand 09/09/2016  . History of osteoarthritis 09/09/2016  . Rheumatoid factor positive 08/01/2016  . High risk medication use 01/16/2015  . G6PD deficiency 10/31/2014  . Dyspnea 08/23/2014  . ILD (interstitial lung disease) (HCC) 08/23/2014  . Other organ or system involvement in systemic lupus erythematosus (HCC) 08/23/2014    Past Surgical History:  Procedure Laterality Date  . ABDOMINAL HYSTERECTOMY    . APPENDECTOMY  1980  . cardiac catherization  08/20/2017  . CARDIAC CATHETERIZATION N/A 05/10/2015     Procedure: Right Heart Cath;  Surgeon: Laurey Morale, MD;  Location: [facility] INVASIVE CV LAB;  Service: Cardiovascular;  Laterality: N/A;  . CESAREAN SECTION  '95, '02, '07   X 3  . CHOLECYSTECTOMY  2010  . LAPAROSCOPIC HYSTERECTOMY  10/2015   have ovaries  . RIGHT HEART CATH N/A 08/20/2017   Procedure: RIGHT HEART CATH;  Surgeon: Laurey Morale, MD;  Location: [facility] INVASIVE CV LAB;  Service: Cardiovascular;  Laterality: N/A;  . TOTAL KNEE ARTHROPLASTY Left 04/04/2019   Procedure: TOTAL KNEE ARTHROPLASTY;  Surgeon: Ollen Gross, MD;  Location: [facility] ORS;  Service: Orthopedics;  Laterality: Left;   . TUBAL LIGATION       OB History   No obstetric history on file.     Family History  Problem Relation Age of Onset  . Sarcoidosis Mother   . Lupus Sister   . Healthy Daughter   . Healthy Son   . Healthy Son     Social History   Tobacco Use  . Smoking status: Former Smoker    Packs/day: 0.50    Years: 14.00    Pack years: 7.00    Types: Cigarettes    Quit date: 09/25/2014    Years since quitting: 5.5  . Smokeless tobacco: Never Used  Vaping Use  . Vaping Use: Never used  Substance Use Topics  . Alcohol use: Yes    Alcohol/week: 0.0 standard drinks  Comment: occas.  . Drug use: No    Home Medications Prior to Admission medications   Medication Sig Start Date End Date Taking? Authorizing Provider  albuterol (VENTOLIN HFA) 108 (90 Base) MCG/ACT inhaler Inhale 2 puffs into the lungs every 6 (six) hours as needed for wheezing or shortness of breath. 11/02/18   Kalman Shan, MD  buPROPion (WELLBUTRIN XL) 150 MG 24 hr tablet Take 150 mg by mouth daily.  01/27/18   [provider]  busPIRone (BUSPAR) 10 MG tablet Take 10 mg by mouth daily.    [provider]  famotidine (PEPCID) 20 MG tablet Take 20 mg by mouth at bedtime.    [provider]  ferrous sulfate 325 (65 FE) MG tablet Take 650 mg by mouth daily with breakfast.    [provider]  fluticasone furoate-vilanterol (BREO ELLIPTA) 100-25 MCG/INH AEPB Inhale 1 puff into the lungs daily. 01/19/20   Kalman Shan, MD  guaiFENesin-codeine 100-10 MG/5ML syrup Take 5 mLs by mouth every 6 (six) hours as needed for cough. Patient not taking: Reported on 01/25/2020 12/22/19   McDonald, Pedro Earls A, PA-C  hydroxychloroquine (PLAQUENIL) 200 MG tablet Take 400 mg by mouth daily.     [provider]  lidocaine (LIDODERM) 5 % Place 1 patch onto the skin daily. Remove & Discard patch within 12 hours or as directed by MD Patient not taking: Reported on 01/25/2020 12/22/19   McDonald, Mia A, PA-C  lisinopril (ZESTRIL) 10 MG tablet Take 10 mg by mouth daily.    [provider]  Magnesium Oxide (MAG-200) 200 MG TABS Take 200 mg by mouth at bedtime.    [provider]  methocarbamol (ROBAXIN) 500 MG tablet Take 1 tablet (500 mg total) by mouth 2 (two) times daily. Patient not taking: Reported on 01/25/2020 12/22/19   McDonald, Pedro Earls A, PA-C  mycophenolate (CELLCEPT) 500 MG tablet Take 1 tablet (500 mg total) by mouth 2 (two) times daily. Patient taking differently: Take 1,000 mg by mouth 2 (two) times daily.  06/06/19   Kalman Shan, MD  Oxycodone HCl 10 MG TABS Take 10 mg by mouth 2 (two) times daily as needed for pain. 12/12/19   [provider]  QUEtiapine (SEROQUEL) 200 MG tablet Take 200 mg by mouth at bedtime.     [provider]  sertraline (ZOLOFT) 100 MG tablet Take 150 mg by mouth at bedtime.  02/23/18   [provider]    Allergies    Asa [aspirin], Penicillins, and Sulfa antibiotics  Review of Systems   Review of Systems  Constitutional: Negative for chills and fever.  Respiratory: Negative for shortness of breath.   Cardiovascular: Negative for chest pain.  Gastrointestinal: Negative for abdominal pain, nausea and vomiting.  Musculoskeletal:       Positive for pain and swelling around the left second finger nail.  Neurological:  Negative for weakness and numbness.  All other systems reviewed and are negative.   Physical Exam Updated Vital Signs BP (!) 174/108 (BP Location: Left Arm) Comment: Pt is on high bp meds & has not taken today as well as in pain.   Pulse 80   Temp 99 F (37.2 C) (Oral)   Resp 18   Ht 5\' 11"  (1.803 m)   Wt 136 kg   LMP 04/28/2015   SpO2 99%   BMI 41.81 kg/m   Physical Exam Vitals and nursing note reviewed.  Constitutional:      General: She is not  in acute distress.    Appearance: Normal appearance. She is not ill-appearing or toxic-appearing.  HENT:     Head: Normocephalic and atraumatic.  Neck:     Comments: No midline tenderness.  Cardiovascular:     Rate and Rhythm: Normal rate.     Pulses:          Radial pulses are 2+ on the right side and 2+ on the left side.  Pulmonary:     Effort: No respiratory distress.     Breath sounds: Normal breath sounds.  Musculoskeletal:     Cervical back: Normal range of motion and neck supple.     Comments: Upper extremities: Acrylic nails present with the exception of the left second finger.  Area just proximal to the left second nailbed is swollen and tender to palpation, concerning for paronychia.  There is some mild swelling of the distal phalanx of the left second finger in general, however the pulp is soft. Intact AROM. Otherwise nontender.   Skin:    General: Skin is warm and dry.     Capillary Refill: Capillary refill takes less than 2 seconds.  Neurological:     Mental Status: She is alert.     Comments: Alert. Clear speech. Sensation grossly intact to bilateral upper extremities. 5/5 symmetric grip strength. Ambulatory.  Able to perform okay sign, thumbs up, cross second/third digits bilaterally.  Psychiatric:        Mood and Affect: Mood normal.        Behavior: Behavior normal.     ED Results / Procedures / Treatments   Labs (all labs ordered are listed, but only abnormal results are displayed) Labs Reviewed - No data  to display  EKG None  Radiology DG Finger Index Left  Result Date: 03/26/2020 CLINICAL DATA:  Pain soft tissue swelling EXAM: LEFT INDEX FINGER 2+V COMPARISON:  None. FINDINGS: There is no evidence of fracture or dislocation. There is no evidence of arthropathy or other focal bone abnormality. Soft tissue swelling is seen on the dorsal aspect of the distal phalanx. IMPRESSION: No acute osseous abnormality. Soft tissue swelling over the dorsal distal phalanx. Electronically Signed   By: Jonna Clark M.D.   On: 03/26/2020 21:41    Procedures .Marland KitchenIncision and Drainage  Date/Time: 03/27/2020 3:08 AM Performed by: Cherly Anderson, PA-C Authorized by: Cherly Anderson, PA-C   Consent:    Consent obtained:  Verbal   Consent given by:  Patient   Risks discussed:  Bleeding, damage to other organs, infection, incomplete drainage and pain   Alternatives discussed:  No treatment Location:    Type:  Abscess (Paronychia) Pre-procedure details:    Skin preparation:  Betadine Anesthesia (see MAR for exact dosages):    Anesthesia method:  Nerve block   Block location:  Left second finger.   Block needle gauge:  27 G   Block anesthetic:  Lidocaine 1% w/o epi   Block injection procedure:  Anatomic landmarks identified, anatomic landmarks palpated, negative aspiration for blood, introduced needle and incremental injection   Block outcome:  Anesthesia achieved Procedure type:    Complexity:  Simple Procedure details:    Scalpel blade:  11   Drainage:  Bloody and purulent   Drainage amount: mild.   Wound treatment:  Wound left open   Packing materials:  None Post-procedure details:    Patient tolerance of procedure:  Tolerated well, no immediate complications Comments:     First incision centrally without significant drainage, second incision  made to ulnar aspect with purulent drainage   (including critical care time)  Medications Ordered in ED Medications - No data to  display  ED Course  I have reviewed the triage vital signs and the nursing notes.  Pertinent labs & imaging results that were available during my care of the patient were reviewed by me and considered in my medical decision making (see chart for details).    MDM Rules/Calculators/A&P                         Patient presents to the emergency department with findings consistent with paronychia.  She is nontoxic and resting comfortably, vitals with elevated blood pressure, doubt hypertensive emergency, otherwise within normal limits. Infection does not seem to extend to the pulp, does not seem consistent w/ felon at this time.  X-rays obtained per triage, I personally viewed and interpreted imaging- No acute osseous abnormality. Soft tissue swelling over the dorsal distal phalanx. Paronychia  was incised and drained.  Will discharge home on doxycycline.  PCP follow-up. I discussed results, treatment plan, need for follow-up, and return precautions with the patient. Provided opportunity for questions, patient confirmed understanding and is in agreement with plan. Findings & plan of care discussed w/ supervising physician Dr. Blinda Leatherwood who has evaluated the patient & is in agreement.   Final Clinical Impression(s) / ED Diagnoses Final diagnoses:  Paronychia of finger of left hand    Rx / DC Orders ED Discharge Orders         Ordered    doxycycline (VIBRAMYCIN) 100 MG capsule  2 times daily        03/27/20 0439           Cherly Anderson, PA-C 03/27/20 0456    Gilda Crease, MD 03/27/20 [phone]

## 2020-03-29 DIAGNOSIS — L02512 Cutaneous abscess of left hand: Secondary | ICD-10-CM | POA: Diagnosis not present

## 2020-04-03 DIAGNOSIS — R739 Hyperglycemia, unspecified: Secondary | ICD-10-CM | POA: Diagnosis not present

## 2020-04-03 DIAGNOSIS — M1990 Unspecified osteoarthritis, unspecified site: Secondary | ICD-10-CM | POA: Diagnosis not present

## 2020-04-03 DIAGNOSIS — I10 Essential (primary) hypertension: Secondary | ICD-10-CM | POA: Diagnosis not present

## 2020-04-03 DIAGNOSIS — Z6841 Body Mass Index (BMI) 40.0 and over, adult: Secondary | ICD-10-CM | POA: Diagnosis not present

## 2020-04-03 DIAGNOSIS — Z79899 Other long term (current) drug therapy: Secondary | ICD-10-CM | POA: Diagnosis not present

## 2020-04-03 DIAGNOSIS — Z23 Encounter for immunization: Secondary | ICD-10-CM | POA: Diagnosis not present

## 2020-04-03 DIAGNOSIS — K219 Gastro-esophageal reflux disease without esophagitis: Secondary | ICD-10-CM | POA: Diagnosis not present

## 2020-04-11 NOTE — Telephone Encounter (Signed)
[facility] - can you follow up on this?

## 2020-04-11 NOTE — Telephone Encounter (Addendum)
Shannon [name] has order that was sent to them in August but they are just behind on getting in machines.  I called [facility] and they have machines on hand.  I spoke to pt & told her I am sending her order to [facility].  She states ok.  Nothing further needed.  Will route to triage to close message.

## 2020-04-13 DIAGNOSIS — E669 Obesity, unspecified: Secondary | ICD-10-CM | POA: Diagnosis not present

## 2020-04-13 DIAGNOSIS — M1711 Unilateral primary osteoarthritis, right knee: Secondary | ICD-10-CM | POA: Diagnosis not present

## 2020-04-13 DIAGNOSIS — G894 Chronic pain syndrome: Secondary | ICD-10-CM | POA: Diagnosis not present

## 2020-04-13 DIAGNOSIS — Z96652 Presence of left artificial knee joint: Secondary | ICD-10-CM | POA: Diagnosis not present

## 2020-04-13 DIAGNOSIS — M25561 Pain in right knee: Secondary | ICD-10-CM | POA: Diagnosis not present

## 2020-04-13 DIAGNOSIS — Z79891 Long term (current) use of opiate analgesic: Secondary | ICD-10-CM | POA: Diagnosis not present

## 2020-04-20 NOTE — Patient Instructions (Addendum)
DUE TO COVID-19 ONLY ONE VISITOR IS ALLOWED TO COME WITH YOU AND STAY IN THE WAITING ROOM ONLY DURING PRE OP AND PROCEDURE.   IF YOU WILL BE ADMITTED INTO THE HOSPITAL YOU ARE ALLOWED ONE SUPPORT PERSON DURING VISITATION HOURS ONLY (10AM -8PM)   . The support person may change daily. . The support person must pass our screening, gel in and out, and wear a mask at all times, including in the patient's room. . Patients must also wear a mask when staff or their support person are in the room.   COVID SWAB TESTING MUST BE COMPLETED ON:   Thursday, 04-26-20 @ 2:45   [address] [place], [place] [address]  (Must self quarantine after testing. Follow instructions on handout.)         Your procedure is scheduled on:  Monday, 04-30-20   Report to [facility] Main  Entrance   Report to admitting at 6:55 AM   Call this number if you have problems the morning of surgery [phone]   Do not eat food :After Midnight.   May have liquids until 6:20 AM day of surgery  CLEAR LIQUID DIET  Foods Allowed                                                                     Foods Excluded  Water, Black Coffee and tea, regular and decaf              liquids that you cannot  Plain Jell-O in any flavor  (No red)                                    see through such as: Fruit ices (not with fruit pulp)                                      milk, soups, orange juice              Iced Popsicles (No red)                                      All solid food                                   Apple juices Sports drinks like Gatorade (No red) Lightly seasoned clear broth or consume(fat free) Sugar, honey syrup     Complete one Ensure drink the morning of surgery at 6:20 AM  the day of surgery.   Oral Hygiene is also important to reduce your risk of infection.                                    Remember - BRUSH YOUR TEETH THE MORNING OF SURGERY WITH YOUR REGULAR TOOTHPASTE   Do NOT smoke after  Midnight   Take these medicines the morning  of surgery with A SIP OF WATER: Bupropion, Buspar, Okay to use Albuterol inhaler and bring with you  day of surgery                                You may not have any metal on your body including hair pins, jewelry, and body piercings             Do not wear make-up, lotions, powders, perfumes/cologne, or deodorant             Do not wear nail polish.  Do not shave  48 hours prior to surgery.              Do not bring valuables to the hospital. [facility] IS NOT RESPONSIBLE   FOR VALUABLES.   Contacts, dentures or bridgework may not be worn into surgery.   Bring small overnight bag day of surgery.                 Please read over the following fact sheets you were given: IF YOU HAVE QUESTIONS ABOUT YOUR PRE OP INSTRUCTIONS PLEASE CALL [phone]   [place] - Preparing for Surgery Before surgery, you can play an important role.  Because skin is not sterile, your skin needs to be as free of germs as possible.  You can reduce the number of germs on your skin by washing with CHG (chlorahexidine gluconate) soap before surgery.  CHG is an antiseptic cleaner which kills germs and bonds with the skin to continue killing germs even after washing. Please DO NOT use if you have an allergy to CHG or antibacterial soaps.  If your skin becomes reddened/irritated stop using the CHG and inform your nurse when you arrive at Short Stay. Do not shave (including legs and underarms) for at least 48 hours prior to the first CHG shower.  You may shave your face/neck.  Please follow these instructions carefully:  1.  Shower with CHG Soap the night before surgery and the  morning of surgery.  2.  If you choose to wash your hair, wash your hair first as usual with your normal  shampoo.  3.  After you shampoo, rinse your hair and body thoroughly to remove the shampoo.                             4.  Use CHG as you would any other liquid soap.  You can apply chg  directly to the skin and wash.  Gently with a scrungie or clean washcloth.  5.  Apply the CHG Soap to your body ONLY FROM THE NECK DOWN.   Do   not use on face/ open                           Wound or open sores. Avoid contact with eyes, ears mouth and   genitals (private parts).                       Wash face,  Genitals (private parts) with your normal soap.             6.  Wash thoroughly, paying special attention to the area where your    surgery  will be performed.  7.  Thoroughly rinse your body with warm water from the neck  down.  8.  DO NOT shower/wash with your normal soap after using and rinsing off the CHG Soap.                9.  Pat yourself dry with a clean towel.            10.  Wear clean pajamas.            11.  Place clean sheets on your bed the night of your first shower and do not  sleep with pets. Day of Surgery : Do not apply any lotions/deodorants the morning of surgery.  Please wear clean clothes to the hospital/surgery center.  FAILURE TO FOLLOW THESE INSTRUCTIONS MAY RESULT IN THE CANCELLATION OF YOUR SURGERY  PATIENT SIGNATURE_________________________________  NURSE SIGNATURE__________________________________  ________________________________________________________________________   Rogelia Mire  An incentive spirometer is a tool that can help keep your lungs clear and active. This tool measures how well you are filling your lungs with each breath. Taking long deep breaths may help reverse or decrease the chance of developing breathing (pulmonary) problems (especially infection) following:  A long period of time when you are unable to move or be active. BEFORE THE PROCEDURE   If the spirometer includes an indicator to show your best effort, your nurse or respiratory therapist will set it to a desired goal.  If possible, sit up straight or lean slightly forward. Try not to slouch.  Hold the incentive spirometer in an upright position. INSTRUCTIONS  FOR USE  1. Sit on the edge of your bed if possible, or sit up as far as you can in bed or on a chair. 2. Hold the incentive spirometer in an upright position. 3. Breathe out normally. 4. Place the mouthpiece in your mouth and seal your lips tightly around it. 5. Breathe in slowly and as deeply as possible, raising the piston or the ball toward the top of the column. 6. Hold your breath for 3-5 seconds or for as long as possible. Allow the piston or ball to fall to the bottom of the column. 7. Remove the mouthpiece from your mouth and breathe out normally. 8. Rest for a few seconds and repeat Steps 1 through 7 at least 10 times every 1-2 hours when you are awake. Take your time and take a few normal breaths between deep breaths. 9. The spirometer may include an indicator to show your best effort. Use the indicator as a goal to work toward during each repetition. 10. After each set of 10 deep breaths, practice coughing to be sure your lungs are clear. If you have an incision (the cut made at the time of surgery), support your incision when coughing by placing a pillow or rolled up towels firmly against it. Once you are able to get out of bed, walk around indoors and cough well. You may stop using the incentive spirometer when instructed by your caregiver.  RISKS AND COMPLICATIONS  Take your time so you do not get dizzy or light-headed.  If you are in pain, you may need to take or ask for pain medication before doing incentive spirometry. It is harder to take a deep breath if you are having pain. AFTER USE  Rest and breathe slowly and easily.  It can be helpful to keep track of a log of your progress. Your caregiver can provide you with a simple table to help with this. If you are using the spirometer at home, follow these instructions: SEEK MEDICAL CARE IF:  You are having difficultly using the spirometer.  You have trouble using the spirometer as often as instructed.  Your pain  medication is not giving enough relief while using the spirometer.  You develop fever of 100.5 F (38.1 C) or higher. SEEK IMMEDIATE MEDICAL CARE IF:   You cough up bloody sputum that had not been present before.  You develop fever of 102 F (38.9 C) or greater.  You develop worsening pain at or near the incision site. MAKE SURE YOU:   Understand these instructions.  Will watch your condition.  Will get help right away if you are not doing well or get worse. Document Released: 10/20/2006 Document Revised: 09/01/2011 Document Reviewed: 12/21/2006 Exit[employer] Patient Information 2014 Exit[employer], [place].   ________________________________________________________________________  WHAT IS A BLOOD TRANSFUSION? Blood Transfusion Information  A transfusion is the replacement of blood or some of its parts. Blood is made up of multiple cells which provide different functions.  Red blood cells carry oxygen and are used for blood loss replacement.  White blood cells fight against infection.  Platelets control bleeding.  Plasma helps clot blood.  Other blood products are available for specialized needs, such as hemophilia or other clotting disorders. BEFORE THE TRANSFUSION  Who gives blood for transfusions?   Healthy volunteers who are fully evaluated to make sure their blood is safe. This is blood bank blood. Transfusion therapy is the safest it has ever been in the practice of medicine. Before blood is taken from a donor, a complete history is taken to make sure that person has no history of diseases nor engages in risky social behavior (examples are intravenous drug use or sexual activity with multiple partners). The donor's travel history is screened to minimize risk of transmitting infections, such as malaria. The donated blood is tested for signs of infectious diseases, such as HIV and hepatitis. The blood is then tested to be sure it is compatible with you in order to minimize the  chance of a transfusion reaction. If you or a relative donates blood, this is often done in anticipation of surgery and is not appropriate for emergency situations. It takes many days to process the donated blood. RISKS AND COMPLICATIONS Although transfusion therapy is very safe and saves many lives, the main dangers of transfusion include:   Getting an infectious disease.  Developing a transfusion reaction. This is an allergic reaction to something in the blood you were given. Every precaution is taken to prevent this. The decision to have a blood transfusion has been considered carefully by your caregiver before blood is given. Blood is not given unless the benefits outweigh the risks. AFTER THE TRANSFUSION  Right after receiving a blood transfusion, you will usually feel much better and more energetic. This is especially true if your red blood cells have gotten low (anemic). The transfusion raises the level of the red blood cells which carry oxygen, and this usually causes an energy increase.  The nurse administering the transfusion will monitor you carefully for complications. HOME CARE INSTRUCTIONS  No special instructions are needed after a transfusion. You may find your energy is better. Speak with your caregiver about any limitations on activity for underlying diseases you may have. SEEK MEDICAL CARE IF:   Your condition is not improving after your transfusion.  You develop redness or irritation at the intravenous (IV) site. SEEK IMMEDIATE MEDICAL CARE IF:  Any of the following symptoms occur over the next 12 hours:  Shaking chills.  You have a temperature  by mouth above 102 F (38.9 C), not controlled by medicine.  Chest, back, or muscle pain.  People around you feel you are not acting correctly or are confused.  Shortness of breath or difficulty breathing.  Dizziness and fainting.  You get a rash or develop hives.  You have a decrease in urine output.  Your urine  turns a dark color or changes to pink, red, or brown. Any of the following symptoms occur over the next 10 days:  You have a temperature by mouth above 102 F (38.9 C), not controlled by medicine.  Shortness of breath.  Weakness after normal activity.  The white part of the eye turns yellow (jaundice).  You have a decrease in the amount of urine or are urinating less often.  Your urine turns a dark color or changes to pink, red, or brown. Document Released: 06/06/2000 Document Revised: 09/01/2011 Document Reviewed: 01/24/2008 [facility] Patient Information 2014 [place], [place].  _______________________________________________________________________

## 2020-04-20 NOTE — Progress Notes (Addendum)
COVID Vaccine Completed: x2 Date COVID Vaccine completed:  10-06-19 & 11-05-19 COVID vaccine manufacturer: Pfizer    Moderna   [employer]   PCP - Eunice Blase, PA-C Cardiologist - Marca Ancona.  Last seen 08-05-17  Chest x-ray - CT chest 12-22-19 in Epic & CXR 12-21-19 EKG - 09-28-19 On chart Emilee Hero Med Stress Test - 04-04-15 in Epic ECHO - 07-15-17 in Epic Cardiac Cath - 08-10-17 in Epic Pacemaker/ICD device last checked:  Sleep Study - 02-02-20 in Epic +sleep apnea CPAP - No  Fasting Blood Sugar -  Checks Blood Sugar _____ times a day  Blood Thinner Instructions: Aspirin Instructions: Last Dose:  Anesthesia review: Interstitial lung disease, lupus, pulmonary HTN.  Chest pain, dyspnea and syncope with stress test 2016  Patient denies shortness of breath, fever, cough and chest pain at PAT appointment   Patient verbalized understanding of instructions that were given to them at the PAT appointment. Patient was also instructed that they will need to review over the PAT instructions again at home before surgery.

## 2020-04-23 ENCOUNTER — Encounter (HOSPITAL_COMMUNITY): Payer: Self-pay

## 2020-04-23 ENCOUNTER — Other Ambulatory Visit: Payer: Self-pay

## 2020-04-23 ENCOUNTER — Encounter (HOSPITAL_COMMUNITY)
Admission: RE | Admit: 2020-04-23 | Discharge: 2020-04-23 | Disposition: A | Discharge status: Home / Self Care | Payer: Medicare HMO | Source: Ambulatory Visit | Attending: Orthopedic Surgery | Admitting: Orthopedic Surgery

## 2020-04-23 DIAGNOSIS — Z01818 Encounter for other preprocedural examination: Secondary | ICD-10-CM | POA: Diagnosis not present

## 2020-04-23 HISTORY — DX: Sleep apnea, unspecified: G47.30

## 2020-04-23 HISTORY — DX: Pneumonia, unspecified organism: J18.9

## 2020-04-23 LAB — COMPREHENSIVE METABOLIC PANEL
ALT: 28 U/L (ref 0–44)
AST: 24 U/L (ref 15–41)
Albumin: 4.1 g/dL (ref 3.5–5.0)
Alkaline Phosphatase: 99 U/L (ref 38–126)
Anion gap: 12 (ref 5–15)
BUN: 17 mg/dL (ref 6–20)
CO2: 28 mmol/L (ref 22–32)
Calcium: 9.6 mg/dL (ref 8.9–10.3)
Chloride: 96 mmol/L — ABNORMAL LOW (ref 98–111)
Creatinine, Ser: 0.89 mg/dL (ref 0.44–1.00)
GFR, Estimated: >60 mL/min (ref >60)
Glucose, Bld: 110 mg/dL — ABNORMAL HIGH (ref 70–99)
Potassium: 3.1 mmol/L — ABNORMAL LOW (ref 3.5–5.1)
Sodium: 136 mmol/L (ref 135–145)
Total Bilirubin: 0.5 mg/dL (ref 0.3–1.2)
Total Protein: 8.1 g/dL (ref 6.5–8.1)

## 2020-04-23 LAB — CBC
HCT: 37.2 % (ref 36.0–46.0)
Hemoglobin: 11.8 g/dL — ABNORMAL LOW (ref 12.0–15.0)
MCH: 30.1 pg (ref 26.0–34.0)
MCHC: 31.7 g/dL (ref 30.0–36.0)
MCV: 94.9 fL (ref 80.0–100.0)
Platelets: 234 10*3/uL (ref 150–400)
RBC: 3.92 MIL/uL (ref 3.87–5.11)
RDW: 12.7 % (ref 11.5–15.5)
WBC: 4.8 10*3/uL (ref 4.0–10.5)
nRBC: 0 % (ref 0.0–0.2)

## 2020-04-23 LAB — PROTIME-INR
INR: 1 (ref 0.8–1.2)
Prothrombin Time: 12.9 seconds (ref 11.4–15.2)

## 2020-04-23 LAB — SURGICAL PCR SCREEN
MRSA, PCR: NEGATIVE
Staphylococcus aureus: NEGATIVE

## 2020-04-23 LAB — APTT: aPTT: 28 seconds (ref 24–36)

## 2020-04-24 NOTE — H&P (Signed)
TOTAL KNEE ADMISSION H&P  Patient is being admitted for right total knee arthroplasty.  Subjective:  Chief Complaint: Right knee pain.  HPI: Shannon [name], 49 y.o. female has a history of pain and functional disability in the right knee due to arthritis and has failed non-surgical conservative treatments for greater than 12 weeks to include corticosteriod injections and activity modification. Onset of symptoms was gradual, starting several years ago with gradually worsening course since that time. The patient noted no past surgery on the right knee.  Patient currently rates pain in the right knee at 8 out of 10 with activity. Patient has worsening of pain with activity and weight bearing, pain that interferes with activities of daily living and crepitus. Patient has evidence of patella alta and near bone-on-bone patellofemoral changes by imaging studies. There is no active infection.  Patient Active Problem List   Diagnosis Date Noted  . OA (osteoarthritis) of knee 04/04/2019  . Acute bronchitis 02/13/2017  . Cough variant asthma  vs uacs  12/17/2016  . Morbid obesity due to excess calories (HCC) 12/17/2016  . Contracture of left elbow 11/21/2016  . Primary osteoarthritis of both knees 11/21/2016  . Pain in right hand 09/09/2016  . Pain in left hand 09/09/2016  . History of osteoarthritis 09/09/2016  . Rheumatoid factor positive 08/01/2016  . High risk medication use 01/16/2015  . G6PD deficiency 10/31/2014  . Dyspnea 08/23/2014  . ILD (interstitial lung disease) (HCC) 08/23/2014  . Other organ or system involvement in systemic lupus erythematosus (HCC) 08/23/2014    Past Medical History:  Diagnosis Date  . Anemia   . Anxiety   . Depression   . Dyspnea    Sometimes at rest  . G6PD deficiency   . ILD (interstitial lung disease) (HCC)   . Lupus (HCC)   . Pneumonia   . Rheumatoid arthritis (HCC)   . Sleep apnea     Past Surgical History:  Procedure Laterality Date  .  ABDOMINAL HYSTERECTOMY    . APPENDECTOMY  1980  . cardiac catherization  08/20/2017  . CARDIAC CATHETERIZATION N/A 05/10/2015   Procedure: Right Heart Cath;  Surgeon: Laurey Morale, MD;  Location: [facility] INVASIVE CV LAB;  Service: Cardiovascular;  Laterality: N/A;  . CESAREAN SECTION  '95, '02, '07   X 3  . CHOLECYSTECTOMY  2010  . LAPAROSCOPIC HYSTERECTOMY  10/2015   have ovaries  . RIGHT HEART CATH N/A 08/20/2017   Procedure: RIGHT HEART CATH;  Surgeon: Laurey Morale, MD;  Location: [facility] INVASIVE CV LAB;  Service: Cardiovascular;  Laterality: N/A;  . TOTAL KNEE ARTHROPLASTY Left 04/04/2019   Procedure: TOTAL KNEE ARTHROPLASTY;  Surgeon: Ollen Gross, MD;  Location: [facility] ORS;  Service: Orthopedics;  Laterality: Left;   . TUBAL LIGATION      Prior to Admission medications   Medication Sig Start Date End Date Taking? Authorizing Provider  albuterol (VENTOLIN HFA) 108 (90 Base) MCG/ACT inhaler Inhale 2 puffs into the lungs every 6 (six) hours as needed for wheezing or shortness of breath. Patient taking differently: Inhale 1 puff into the lungs every 4 (four) hours as needed for wheezing or shortness of breath.  11/02/18  Yes Kalman Shan, MD  buPROPion (WELLBUTRIN XL) 150 MG 24 hr tablet Take 150 mg by mouth daily.  01/27/18  Yes [provider]  busPIRone (BUSPAR) 10 MG tablet Take 10 mg by mouth daily.   Yes [provider]  famotidine (PEPCID) 20 MG tablet Take 20  mg by mouth at bedtime.   Yes [provider]  ferrous sulfate 325 (65 FE) MG tablet Take 650 mg by mouth daily with breakfast.   Yes [provider]  fluticasone furoate-vilanterol (BREO ELLIPTA) 100-25 MCG/INH AEPB Inhale 1 puff into the lungs daily. 01/19/20  Yes Kalman Shan, MD  hydroxychloroquine (PLAQUENIL) 200 MG tablet Take 400 mg by mouth daily.    Yes [provider]  ibuprofen (ADVIL) 200 MG tablet Take 800 mg by mouth every 6 (six) hours as needed for headache  or moderate pain.   Yes [provider]  lisinopril (ZESTRIL) 10 MG tablet Take 10 mg by mouth daily.   Yes [provider]  Magnesium 250 MG TABS Take 250 mg by mouth at bedtime.   Yes [provider]  mycophenolate (CELLCEPT) 500 MG tablet Take 1 tablet (500 mg total) by mouth 2 (two) times daily. Patient taking differently: Take 1,000 mg by mouth 2 (two) times daily.  06/06/19  Yes Kalman Shan, MD  Oxycodone HCl 10 MG TABS Take 10 mg by mouth 2 (two) times daily as needed (pain).  12/12/19  Yes [provider]  QUEtiapine (SEROQUEL) 200 MG tablet Take 200 mg by mouth at bedtime.    Yes [provider]  sertraline (ZOLOFT) 100 MG tablet Take 150 mg by mouth at bedtime.  02/23/18  Yes [provider]  doxycycline (VIBRAMYCIN) 100 MG capsule Take 1 capsule (100 mg total) by mouth 2 (two) times daily. Patient not taking: Reported on 04/19/2020 03/27/20   Petrucelli, Samantha R, PA-C  guaiFENesin-codeine 100-10 MG/5ML syrup Take 5 mLs by mouth every 6 (six) hours as needed for cough. Patient not taking: Reported on 01/25/2020 12/22/19   McDonald, Mia A, PA-C  lidocaine (LIDODERM) 5 % Place 1 patch onto the skin daily. Remove & Discard patch within 12 hours or as directed by MD Patient not taking: Reported on 01/25/2020 12/22/19   McDonald, Mia A, PA-C  methocarbamol (ROBAXIN) 500 MG tablet Take 1 tablet (500 mg total) by mouth 2 (two) times daily. Patient not taking: Reported on 04/19/2020 12/22/19   McDonald, Mia A, PA-C    Allergies  Allergen Reactions  . Asa [Aspirin] Hives and Shortness Of Breath    Chest tightness   . Penicillins Hives    Has patient had a PCN reaction causing immediate rash, facial/tongue/throat swelling, SOB or lightheadedness with hypotension: Unknown Has patient had a PCN reaction causing severe rash involving mucus membranes or skin necrosis: Unknown Has patient had a PCN reaction that required hospitalization:  Unknown Has patient had a PCN reaction occurring within the last 10 years: No If all of the above answers are "NO", then may proceed with Cephalosporin use.   . Sulfa Antibiotics     G6PD deficiency     Social History   Socioeconomic History  . Marital status: Married    Spouse name: [name]  . Number of children: 3  . Years of education: 22  . Highest education level: Not on file  Occupational History  . 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 Smoking status: Former Smoker    Packs/day: 0.50    Years: 14.00    Pack years: 7.00    Types: Cigarettes    Quit date: 09/25/2014    Years since quitting: 5.5  . Smokeless tobacco: Never Used  Vaping Use  . Vaping Use: Never used  Substance and Sexual Activity  . Alcohol use: Yes    Alcohol/week: 0.0 standard drinks    Comment: occas.  . Drug use: No  . Sexual activity: Not on file    Comment: Hysterectomy  Other Topics Concern  . Not on file  Social History Narrative   Married, lives with husband, children   Caffeine- coffee  1 daily   Social Determinants of Health   Financial Resource Strain:   . Difficulty of Paying Living Expenses: Not on file  Food Insecurity:   . Worried About Programme researcher, broadcasting/film/video in the Last Year: Not on file  . Ran Out of Food in the Last Year: Not on file  Transportation Needs:   . Lack of Transportation (Medical): Not on file  . Lack of Transportation (Non-Medical): Not on file  Physical Activity:   . Days of Exercise per Week: Not on file  . Minutes of Exercise per Session: Not on file  Stress:   . Feeling of Stress : Not on file  Social Connections:   . Frequency of Communication with Friends and Family: Not on file  . Frequency of Social Gatherings with Friends and Family: Not on file  . Attends Religious Services: Not on file  . Active Member of Clubs or Organizations: Not on file  . Attends Banker Meetings: Not  on file  . Marital Status: Not on file  Intimate Partner Violence:   . Fear of Current or Ex-Partner: Not on file  . Emotionally Abused: Not on file  . Physically Abused: Not on file  . Sexually Abused: Not on file      Tobacco Use: Medium Risk  . Smoking Tobacco Use: Former Smoker  . Smokeless Tobacco Use: Never Used   Social History   Substance and Sexual Activity  Alcohol Use Yes  . Alcohol/week: 0.0 standard drinks   Comment: occas.    Family History  Problem Relation Age of Onset  . Sarcoidosis Mother   . Lupus Sister   . Healthy Daughter   . Healthy Son   . Healthy Son     Review of Systems  Constitutional: Negative for chills and fever.  HENT: Negative for congestion, sore throat and tinnitus.   Eyes: Negative for double vision, photophobia and pain.  Respiratory: Negative for cough, shortness of breath and wheezing.   Cardiovascular: Negative for chest pain, palpitations and orthopnea.  Gastrointestinal: Negative for heartburn, nausea and vomiting.  Genitourinary: Negative for dysuria, frequency and urgency.  Musculoskeletal: Positive for joint pain.  Neurological: Negative for dizziness, weakness and headaches.    Objective:  Physical Exam: Well nourished and well developed.  General: Alert and oriented x3, cooperative and pleasant, no acute distress.  Head: normocephalic, atraumatic, neck supple.  Eyes: EOMI.  Respiratory: breath sounds clear in all fields, no wheezing, rales, or rhonchi.  Cardiovascular: Regular rate and rhythm, no murmurs, gallops or rubs.  Abdomen: non-tender to palpation and soft, normoactive bowel sounds.  Musculoskeletal:   Right Knee Exam:  No effusion present. No swelling present.  The range of motion is: 5 to 125 degrees.  Marked crepitus on range of motion of the knee.  Slight medial joint line tenderness.  No lateral joint line tenderness.  The knee is stable.   Calves soft and nontender. Motor function intact in LE.  Strength 5/5 LE bilaterally.  Neuro: Distal pulses 2+. Sensation to light touch intact in LE.  Imaging Review Plain radiographs demonstrate severe degenerative joint disease  of the right knee. The overall alignment is neutral. The bone quality appears to be adequate for age and reported activity level.  Assessment/Plan:  End stage arthritis, right knee   The patient history, physical examination, clinical judgment of the provider and imaging studies are consistent with end stage degenerative joint disease of the right knee and total knee arthroplasty is deemed medically necessary. The treatment options including medical management, injection therapy arthroscopy and arthroplasty were discussed at length. The risks and benefits of total knee arthroplasty were presented and reviewed. The risks due to aseptic loosening, infection, stiffness, patella tracking problems, thromboembolic complications and other imponderables were discussed. The patient acknowledged the explanation, agreed to proceed with the plan and consent was signed. Patient is being admitted for inpatient treatment for surgery, pain control, PT, OT, prophylactic antibiotics, VTE prophylaxis, progressive ambulation and ADLs and discharge planning. The patient is planning to be discharged home.  Patient's anticipated LOS is less than 2 midnights, meeting these requirements: - Younger than 24 - Lives within 1 hour of care - Has a competent adult at home to recover with post-op recover - NO history of  - Diabetes  - Coronary Artery Disease  - Heart failure  - Heart attack  - Stroke  - DVT/VTE  - Cardiac arrhythmia  - Respiratory Failure/COPD  - Renal failure  - Anemia  - Advanced Liver disease  Therapy Plans: Outpatient therapy at [facility] Disposition: Home with husband Planned DVT Prophylaxis: Xarelto 10 mg QD (allergy to ASA) DME Needed: None PCP: [name], PA-C (clearance received) TXA: IV Allergies: ASA, PCN  (abdominal pain, hives) Anesthesia Concerns: None BMI: 39.7 Last HgbA1c: Not diabetic.  Other: - Oxycodone 10 mg BID  - No narcotics to be sent after surgery, pain management handling this  - Patient was instructed on what medications to stop prior to surgery. - Follow-up visit in 2 weeks with Dr. Lequita Halt - Begin physical therapy following surgery - Pre-operative lab work as pre-surgical testing - Prescriptions will be provided in hospital at time of discharge  Arther Abbott, PA-C Orthopedic Surgery [facility] [place]

## 2020-04-26 ENCOUNTER — Other Ambulatory Visit (HOSPITAL_COMMUNITY)
Admission: RE | Admit: 2020-04-26 | Discharge: 2020-04-26 | Disposition: A | Discharge status: Home / Self Care | Payer: Medicare HMO | Source: Ambulatory Visit | Attending: Orthopedic Surgery | Admitting: Orthopedic Surgery

## 2020-04-26 DIAGNOSIS — Z20822 Contact with and (suspected) exposure to covid-19: Secondary | ICD-10-CM | POA: Insufficient documentation

## 2020-04-26 DIAGNOSIS — G4710 Hypersomnia, unspecified: Secondary | ICD-10-CM | POA: Diagnosis not present

## 2020-04-26 DIAGNOSIS — Z01818 Encounter for other preprocedural examination: Secondary | ICD-10-CM | POA: Insufficient documentation

## 2020-04-26 DIAGNOSIS — G4733 Obstructive sleep apnea (adult) (pediatric): Secondary | ICD-10-CM | POA: Diagnosis not present

## 2020-04-26 LAB — SARS CORONAVIRUS 2 (TAT 6-24 HRS): SARS Coronavirus 2: NEGATIVE

## 2020-04-27 DIAGNOSIS — G4730 Sleep apnea, unspecified: Secondary | ICD-10-CM | POA: Insufficient documentation

## 2020-04-29 MED ORDER — DEXTROSE 5 % IV SOLN
3.0000 g | INTRAVENOUS | Status: AC
  Administered 2020-04-30: 3 g via INTRAVENOUS
  Filled 2020-04-29: qty 3

## 2020-04-29 MED ORDER — BUPIVACAINE LIPOSOME 1.3 % IJ SUSP
20.0000 mL | Freq: Once | INTRAMUSCULAR | Status: DC
  Filled 2020-04-29: qty 20

## 2020-04-30 ENCOUNTER — Other Ambulatory Visit: Payer: Self-pay

## 2020-04-30 ENCOUNTER — Encounter (HOSPITAL_COMMUNITY)
Admission: RE | Disposition: A | Discharge status: Home / Self Care | Payer: Self-pay | Source: Home / Self Care | Attending: Orthopedic Surgery

## 2020-04-30 ENCOUNTER — Ambulatory Visit (HOSPITAL_COMMUNITY): Payer: Medicare HMO | Admitting: Certified Registered Nurse Anesthetist

## 2020-04-30 ENCOUNTER — Ambulatory Visit (HOSPITAL_COMMUNITY)
Admission: RE | Admit: 2020-04-30 | Discharge: 2020-05-02 | Disposition: A | Discharge status: Home / Self Care | Payer: Medicare HMO | Attending: Orthopedic Surgery | Admitting: Orthopedic Surgery

## 2020-04-30 ENCOUNTER — Encounter (HOSPITAL_COMMUNITY): Payer: Self-pay | Admitting: Orthopedic Surgery

## 2020-04-30 ENCOUNTER — Ambulatory Visit (HOSPITAL_COMMUNITY): Payer: Medicare HMO | Admitting: Physician Assistant

## 2020-04-30 DIAGNOSIS — G8918 Other acute postprocedural pain: Secondary | ICD-10-CM | POA: Diagnosis not present

## 2020-04-30 DIAGNOSIS — Z882 Allergy status to sulfonamides status: Secondary | ICD-10-CM | POA: Diagnosis not present

## 2020-04-30 DIAGNOSIS — I10 Essential (primary) hypertension: Secondary | ICD-10-CM | POA: Diagnosis not present

## 2020-04-30 DIAGNOSIS — M1710 Unilateral primary osteoarthritis, unspecified knee: Secondary | ICD-10-CM | POA: Diagnosis present

## 2020-04-30 DIAGNOSIS — Z87891 Personal history of nicotine dependence: Secondary | ICD-10-CM | POA: Diagnosis not present

## 2020-04-30 DIAGNOSIS — Z886 Allergy status to analgesic agent status: Secondary | ICD-10-CM | POA: Insufficient documentation

## 2020-04-30 DIAGNOSIS — Z79899 Other long term (current) drug therapy: Secondary | ICD-10-CM | POA: Insufficient documentation

## 2020-04-30 DIAGNOSIS — Z880 Allergy status to penicillin: Secondary | ICD-10-CM | POA: Insufficient documentation

## 2020-04-30 DIAGNOSIS — M25761 Osteophyte, right knee: Secondary | ICD-10-CM | POA: Insufficient documentation

## 2020-04-30 DIAGNOSIS — M1711 Unilateral primary osteoarthritis, right knee: Secondary | ICD-10-CM | POA: Diagnosis not present

## 2020-04-30 DIAGNOSIS — M179 Osteoarthritis of knee, unspecified: Secondary | ICD-10-CM | POA: Diagnosis present

## 2020-04-30 DIAGNOSIS — Z7951 Long term (current) use of inhaled steroids: Secondary | ICD-10-CM | POA: Insufficient documentation

## 2020-04-30 DIAGNOSIS — R69 Illness, unspecified: Secondary | ICD-10-CM | POA: Diagnosis not present

## 2020-04-30 HISTORY — PX: TOTAL KNEE ARTHROPLASTY: SHX125

## 2020-04-30 LAB — TYPE AND SCREEN
ABO/RH(D): A POS
Antibody Screen: NEGATIVE

## 2020-04-30 SURGERY — ARTHROPLASTY, KNEE, TOTAL
Anesthesia: Regional | Site: Knee | Laterality: Right

## 2020-04-30 MED ORDER — PROPOFOL 10 MG/ML IV BOLUS
INTRAVENOUS | Status: AC
  Filled 2020-04-30: qty 20

## 2020-04-30 MED ORDER — PROMETHAZINE HCL 25 MG/ML IJ SOLN: 6.2500 mg | INTRAMUSCULAR | Status: DC | PRN

## 2020-04-30 MED ORDER — METHOCARBAMOL 500 MG PO TABS
500.0000 mg | ORAL_TABLET | Freq: Four times a day (QID) | ORAL | Status: DC | PRN
  Administered 2020-04-30 – 2020-05-02 (×6): 500 mg via ORAL
  Filled 2020-04-30 (×6): qty 1

## 2020-04-30 MED ORDER — METHOCARBAMOL 500 MG IVPB - SIMPLE MED
500.0000 mg | Freq: Four times a day (QID) | INTRAVENOUS | Status: DC | PRN
  Filled 2020-04-30: qty 50

## 2020-04-30 MED ORDER — ACETAMINOPHEN 10 MG/ML IV SOLN: 1000.0000 mg | Freq: Once | INTRAVENOUS | Status: DC | PRN

## 2020-04-30 MED ORDER — SODIUM CHLORIDE (PF) 0.9 % IJ SOLN
INTRAMUSCULAR | Status: DC | PRN
  Administered 2020-04-30: 60 mL

## 2020-04-30 MED ORDER — MYCOPHENOLATE MOFETIL 250 MG PO CAPS
1000.0000 mg | ORAL_CAPSULE | Freq: Two times a day (BID) | ORAL | Status: DC
  Administered 2020-04-30 – 2020-05-02 (×4): 1000 mg via ORAL
  Filled 2020-04-30 (×4): qty 4

## 2020-04-30 MED ORDER — TRANEXAMIC ACID-NACL 1000-0.7 MG/100ML-% IV SOLN
INTRAVENOUS | Status: AC
  Filled 2020-04-30: qty 100

## 2020-04-30 MED ORDER — DEXAMETHASONE SODIUM PHOSPHATE 10 MG/ML IJ SOLN
INTRAMUSCULAR | Status: AC
  Filled 2020-04-30: qty 1

## 2020-04-30 MED ORDER — ROPIVACAINE HCL 5 MG/ML IJ SOLN
INTRAMUSCULAR | Status: DC | PRN
  Administered 2020-04-30 (×2): 5 mL via PERINEURAL

## 2020-04-30 MED ORDER — FAMOTIDINE 20 MG PO TABS
20.0000 mg | ORAL_TABLET | Freq: Every day | ORAL | Status: DC
  Administered 2020-04-30 – 2020-05-01 (×2): 20 mg via ORAL
  Filled 2020-04-30 (×2): qty 1

## 2020-04-30 MED ORDER — MIDAZOLAM HCL 2 MG/2ML IJ SOLN: 1.0000 mg | INTRAMUSCULAR | Status: DC

## 2020-04-30 MED ORDER — ROPIVACAINE HCL 7.5 MG/ML IJ SOLN
INTRAMUSCULAR | Status: DC | PRN
  Administered 2020-04-30 (×4): 5 mL via PERINEURAL

## 2020-04-30 MED ORDER — SODIUM CHLORIDE (PF) 0.9 % IJ SOLN
INTRAMUSCULAR | Status: AC
  Filled 2020-04-30: qty 10

## 2020-04-30 MED ORDER — SERTRALINE HCL 50 MG PO TABS
150.0000 mg | ORAL_TABLET | Freq: Every day | ORAL | Status: DC
  Administered 2020-04-30 – 2020-05-01 (×2): 150 mg via ORAL
  Filled 2020-04-30 (×2): qty 3

## 2020-04-30 MED ORDER — ALBUTEROL SULFATE HFA 108 (90 BASE) MCG/ACT IN AERS: 2.0000 | INHALATION_SPRAY | Freq: Four times a day (QID) | RESPIRATORY_TRACT | Status: DC | PRN

## 2020-04-30 MED ORDER — PHENOL 1.4 % MT LIQD: 1.0000 | OROMUCOSAL | Status: DC | PRN

## 2020-04-30 MED ORDER — METOCLOPRAMIDE HCL 5 MG/ML IJ SOLN: 5.0000 mg | Freq: Three times a day (TID) | INTRAMUSCULAR | Status: DC | PRN

## 2020-04-30 MED ORDER — ORAL CARE MOUTH RINSE: 15.0000 mL | Freq: Once | OROMUCOSAL | Status: AC

## 2020-04-30 MED ORDER — DEXAMETHASONE SODIUM PHOSPHATE 10 MG/ML IJ SOLN
10.0000 mg | Freq: Once | INTRAMUSCULAR | Status: AC
  Administered 2020-05-01: 10 mg via INTRAVENOUS
  Filled 2020-04-30: qty 1

## 2020-04-30 MED ORDER — SODIUM CHLORIDE 0.9 % IR SOLN
Status: DC | PRN
  Administered 2020-04-30: 1000 mL

## 2020-04-30 MED ORDER — CEFAZOLIN SODIUM-DEXTROSE 2-4 GM/100ML-% IV SOLN
2.0000 g | Freq: Four times a day (QID) | INTRAVENOUS | Status: AC
  Administered 2020-04-30 (×2): 2 g via INTRAVENOUS
  Filled 2020-04-30 (×2): qty 100

## 2020-04-30 MED ORDER — TRANEXAMIC ACID-NACL 1000-0.7 MG/100ML-% IV SOLN
1000.0000 mg | INTRAVENOUS | Status: AC
  Administered 2020-04-30: 1000 mg via INTRAVENOUS

## 2020-04-30 MED ORDER — ONDANSETRON HCL 4 MG/2ML IJ SOLN: 4.0000 mg | Freq: Four times a day (QID) | INTRAMUSCULAR | Status: DC | PRN

## 2020-04-30 MED ORDER — LACTATED RINGERS IV SOLN: INTRAVENOUS | Status: DC

## 2020-04-30 MED ORDER — OXYCODONE HCL 5 MG PO TABS
5.0000 mg | ORAL_TABLET | ORAL | Status: DC | PRN
  Administered 2020-04-30: 5 mg via ORAL
  Administered 2020-04-30: 10 mg via ORAL
  Filled 2020-04-30: qty 2
  Filled 2020-04-30: qty 1

## 2020-04-30 MED ORDER — POLYETHYLENE GLYCOL 3350 17 G PO PACK: 17.0000 g | PACK | Freq: Every day | ORAL | Status: DC | PRN

## 2020-04-30 MED ORDER — MORPHINE SULFATE (PF) 2 MG/ML IV SOLN
0.5000 mg | INTRAVENOUS | Status: DC | PRN
  Administered 2020-04-30: 1 mg via INTRAVENOUS
  Filled 2020-04-30: qty 1

## 2020-04-30 MED ORDER — DOCUSATE SODIUM 100 MG PO CAPS
100.0000 mg | ORAL_CAPSULE | Freq: Two times a day (BID) | ORAL | Status: DC
  Administered 2020-04-30 – 2020-05-02 (×4): 100 mg via ORAL
  Filled 2020-04-30 (×4): qty 1

## 2020-04-30 MED ORDER — STERILE WATER FOR IRRIGATION IR SOLN
Status: DC | PRN
  Administered 2020-04-30: 2000 mL

## 2020-04-30 MED ORDER — FLUTICASONE FUROATE-VILANTEROL 100-25 MCG/INH IN AEPB
1.0000 | INHALATION_SPRAY | Freq: Every day | RESPIRATORY_TRACT | Status: DC
  Filled 2020-04-30: qty 28

## 2020-04-30 MED ORDER — ACETAMINOPHEN 325 MG PO TABS
325.0000 mg | ORAL_TABLET | Freq: Four times a day (QID) | ORAL | Status: DC | PRN
  Administered 2020-05-01: 650 mg via ORAL
  Filled 2020-04-30: qty 2

## 2020-04-30 MED ORDER — MENTHOL 3 MG MT LOZG: 1.0000 | LOZENGE | OROMUCOSAL | Status: DC | PRN

## 2020-04-30 MED ORDER — METOCLOPRAMIDE HCL 5 MG PO TABS: 5.0000 mg | ORAL_TABLET | Freq: Three times a day (TID) | ORAL | Status: DC | PRN

## 2020-04-30 MED ORDER — SODIUM CHLORIDE 0.9 % IV SOLN: INTRAVENOUS | Status: DC

## 2020-04-30 MED ORDER — SODIUM CHLORIDE (PF) 0.9 % IJ SOLN
INTRAMUSCULAR | Status: AC
  Filled 2020-04-30: qty 50

## 2020-04-30 MED ORDER — FLEET ENEMA 7-19 GM/118ML RE ENEM: 1.0000 | ENEMA | Freq: Once | RECTAL | Status: DC | PRN

## 2020-04-30 MED ORDER — BUPROPION HCL ER (XL) 150 MG PO TB24
150.0000 mg | ORAL_TABLET | Freq: Every day | ORAL | Status: DC
  Administered 2020-05-01 – 2020-05-02 (×2): 150 mg via ORAL
  Filled 2020-04-30 (×3): qty 1

## 2020-04-30 MED ORDER — MIDAZOLAM HCL 2 MG/2ML IJ SOLN
INTRAMUSCULAR | Status: AC
  Administered 2020-04-30: 2 mg via INTRAVENOUS
  Filled 2020-04-30: qty 2

## 2020-04-30 MED ORDER — GABAPENTIN 300 MG PO CAPS
300.0000 mg | ORAL_CAPSULE | Freq: Three times a day (TID) | ORAL | Status: DC
  Administered 2020-04-30 – 2020-05-02 (×6): 300 mg via ORAL
  Filled 2020-04-30 (×6): qty 1

## 2020-04-30 MED ORDER — DEXAMETHASONE SODIUM PHOSPHATE 10 MG/ML IJ SOLN: 8.0000 mg | Freq: Once | INTRAMUSCULAR | Status: DC

## 2020-04-30 MED ORDER — BUPIVACAINE IN DEXTROSE 0.75-8.25 % IT SOLN
INTRATHECAL | Status: DC | PRN
  Administered 2020-04-30: 1.6 mL via INTRATHECAL

## 2020-04-30 MED ORDER — ONDANSETRON HCL 4 MG PO TABS: 4.0000 mg | ORAL_TABLET | Freq: Four times a day (QID) | ORAL | Status: DC | PRN

## 2020-04-30 MED ORDER — FENTANYL CITRATE (PF) 100 MCG/2ML IJ SOLN
INTRAMUSCULAR | Status: AC
  Administered 2020-04-30: 100 ug via INTRAVENOUS
  Filled 2020-04-30: qty 2

## 2020-04-30 MED ORDER — POVIDONE-IODINE 10 % EX SWAB
2.0000 "application " | Freq: Once | CUTANEOUS | Status: AC
  Administered 2020-04-30: 2 via TOPICAL

## 2020-04-30 MED ORDER — OXYCODONE HCL 5 MG PO TABS
10.0000 mg | ORAL_TABLET | ORAL | Status: DC | PRN
  Administered 2020-04-30 – 2020-05-02 (×8): 15 mg via ORAL
  Filled 2020-04-30 (×8): qty 3

## 2020-04-30 MED ORDER — RIVAROXABAN 10 MG PO TABS
10.0000 mg | ORAL_TABLET | Freq: Every day | ORAL | Status: DC
  Administered 2020-05-01 – 2020-05-02 (×2): 10 mg via ORAL
  Filled 2020-04-30 (×2): qty 1

## 2020-04-30 MED ORDER — QUETIAPINE FUMARATE 50 MG PO TABS
200.0000 mg | ORAL_TABLET | Freq: Every day | ORAL | Status: DC
  Administered 2020-04-30 – 2020-05-01 (×2): 200 mg via ORAL
  Filled 2020-04-30 (×2): qty 4

## 2020-04-30 MED ORDER — CHLORHEXIDINE GLUCONATE 0.12 % MT SOLN
15.0000 mL | Freq: Once | OROMUCOSAL | Status: AC
  Administered 2020-04-30: 15 mL via OROMUCOSAL

## 2020-04-30 MED ORDER — ONDANSETRON HCL 4 MG/2ML IJ SOLN
INTRAMUSCULAR | Status: DC | PRN
  Administered 2020-04-30: 4 mg via INTRAVENOUS

## 2020-04-30 MED ORDER — CLONIDINE HCL (ANALGESIA) 100 MCG/ML EP SOLN
EPIDURAL | Status: DC | PRN
  Administered 2020-04-30: 100 ug

## 2020-04-30 MED ORDER — DEXAMETHASONE SODIUM PHOSPHATE 10 MG/ML IJ SOLN
INTRAMUSCULAR | Status: DC | PRN
  Administered 2020-04-30: 8 mg via INTRAVENOUS

## 2020-04-30 MED ORDER — ACETAMINOPHEN 10 MG/ML IV SOLN
1000.0000 mg | Freq: Four times a day (QID) | INTRAVENOUS | Status: DC
  Administered 2020-04-30: 1000 mg via INTRAVENOUS
  Filled 2020-04-30: qty 100

## 2020-04-30 MED ORDER — FERROUS SULFATE 325 (65 FE) MG PO TABS
650.0000 mg | ORAL_TABLET | Freq: Every day | ORAL | Status: DC
  Administered 2020-05-01 – 2020-05-02 (×2): 650 mg via ORAL
  Filled 2020-04-30 (×2): qty 2

## 2020-04-30 MED ORDER — MEPERIDINE HCL 50 MG/ML IJ SOLN: 6.2500 mg | INTRAMUSCULAR | Status: DC | PRN

## 2020-04-30 MED ORDER — ONDANSETRON HCL 4 MG/2ML IJ SOLN
INTRAMUSCULAR | Status: AC
  Filled 2020-04-30: qty 2

## 2020-04-30 MED ORDER — HYDROMORPHONE HCL 1 MG/ML IJ SOLN: 0.2500 mg | INTRAMUSCULAR | Status: DC | PRN

## 2020-04-30 MED ORDER — PROPOFOL 10 MG/ML IV BOLUS
INTRAVENOUS | Status: DC | PRN
  Administered 2020-04-30 (×2): 30 mg via INTRAVENOUS

## 2020-04-30 MED ORDER — BISACODYL 10 MG RE SUPP: 10.0000 mg | Freq: Every day | RECTAL | Status: DC | PRN

## 2020-04-30 MED ORDER — BUPIVACAINE LIPOSOME 1.3 % IJ SUSP
INTRAMUSCULAR | Status: DC | PRN
  Administered 2020-04-30: 20 mL

## 2020-04-30 MED ORDER — 0.9 % SODIUM CHLORIDE (POUR BTL) OPTIME
TOPICAL | Status: DC | PRN
  Administered 2020-04-30: 1000 mL

## 2020-04-30 MED ORDER — PROPOFOL 1000 MG/100ML IV EMUL
INTRAVENOUS | Status: AC
  Filled 2020-04-30: qty 100

## 2020-04-30 MED ORDER — PROPOFOL 500 MG/50ML IV EMUL
INTRAVENOUS | Status: DC | PRN
  Administered 2020-04-30: 75 ug/kg/min via INTRAVENOUS

## 2020-04-30 MED ORDER — MYCOPHENOLATE MOFETIL 250 MG PO CAPS: 500.0000 mg | ORAL_CAPSULE | Freq: Two times a day (BID) | ORAL | Status: DC

## 2020-04-30 MED ORDER — DIPHENHYDRAMINE HCL 12.5 MG/5ML PO ELIX: 12.5000 mg | ORAL_SOLUTION | ORAL | Status: DC | PRN

## 2020-04-30 MED ORDER — FENTANYL CITRATE (PF) 100 MCG/2ML IJ SOLN: 50.0000 ug | INTRAMUSCULAR | Status: DC

## 2020-04-30 MED ORDER — BUSPIRONE HCL 5 MG PO TABS
10.0000 mg | ORAL_TABLET | Freq: Every day | ORAL | Status: DC
  Administered 2020-05-01 – 2020-05-02 (×2): 10 mg via ORAL
  Filled 2020-04-30 (×2): qty 2

## 2020-04-30 SURGICAL SUPPLY — 54 items
ATTUNE MED DOME PAT 38 KNEE (Knees) ×2 IMPLANT
ATTUNE MED DOME PAT 38MM KNEE (Knees) ×1 IMPLANT
ATTUNE PS FEM RT SZ 7 CEM KNEE (Femur) ×3 IMPLANT
ATTUNE PSRP INSR SZ7 6 KNEE (Insert) ×2 IMPLANT
ATTUNE PSRP INSR SZ7 6MM KNEE (Insert) ×1 IMPLANT
BAG ZIPLOCK 12X15 (MISCELLANEOUS) ×3 IMPLANT
BASE TIBIA ATTUNE KNEE SYS SZ6 (Knees) ×1 IMPLANT
BLADE SAG 18X100X1.27 (BLADE) ×3 IMPLANT
BLADE SAW SGTL 11.0X1.19X90.0M (BLADE) ×3 IMPLANT
BLADE SURG SZ10 CARB STEEL (BLADE) ×6 IMPLANT
BNDG ELASTIC 6X5.8 VLCR STR LF (GAUZE/BANDAGES/DRESSINGS) ×3 IMPLANT
BOWL SMART MIX CTS (DISPOSABLE) ×3 IMPLANT
CEMENT HV SMART SET (Cement) ×6 IMPLANT
CLOSURE WOUND 1/2 X4 (GAUZE/BANDAGES/DRESSINGS) ×2
COVER SURGICAL LIGHT HANDLE (MISCELLANEOUS) ×3 IMPLANT
COVER WAND RF STERILE (DRAPES) IMPLANT
CUFF TOURN SGL QUICK 34 (TOURNIQUET CUFF) ×2
CUFF TRNQT CYL 34X4.125X (TOURNIQUET CUFF) ×1 IMPLANT
DECANTER SPIKE VIAL GLASS SM (MISCELLANEOUS) ×3 IMPLANT
DRAPE U-SHAPE 47X51 STRL (DRAPES) ×3 IMPLANT
DRSG AQUACEL AG ADV 3.5X10 (GAUZE/BANDAGES/DRESSINGS) ×3 IMPLANT
DURAPREP 26ML APPLICATOR (WOUND CARE) ×3 IMPLANT
ELECT REM PT RETURN 15FT ADLT (MISCELLANEOUS) ×3 IMPLANT
GLOVE BIO SURGEON STRL SZ7 (GLOVE) ×3 IMPLANT
GLOVE BIO SURGEON STRL SZ8 (GLOVE) ×3 IMPLANT
GLOVE BIOGEL PI IND STRL 7.0 (GLOVE) ×1 IMPLANT
GLOVE BIOGEL PI IND STRL 8 (GLOVE) ×1 IMPLANT
GLOVE BIOGEL PI INDICATOR 7.0 (GLOVE) ×2
GLOVE BIOGEL PI INDICATOR 8 (GLOVE) ×2
GOWN STRL REUS W/TWL LRG LVL3 (GOWN DISPOSABLE) ×6 IMPLANT
HANDPIECE INTERPULSE COAX TIP (DISPOSABLE) ×2
HOLDER FOLEY CATH W/STRAP (MISCELLANEOUS) IMPLANT
IMMOBILIZER KNEE 20 (SOFTGOODS) ×3
IMMOBILIZER KNEE 20 THIGH 36 (SOFTGOODS) ×1 IMPLANT
KIT TURNOVER KIT A (KITS) IMPLANT
MANIFOLD NEPTUNE II (INSTRUMENTS) ×3 IMPLANT
NS IRRIG 1000ML POUR BTL (IV SOLUTION) ×3 IMPLANT
PACK TOTAL KNEE CUSTOM (KITS) ×3 IMPLANT
PADDING CAST COTTON 6X4 STRL (CAST SUPPLIES) ×3 IMPLANT
PENCIL SMOKE EVACUATOR (MISCELLANEOUS) ×3 IMPLANT
PIN FIX SIGMA LCS THRD HI (PIN) ×3 IMPLANT
PIN STEINMAN FIXATION KNEE (PIN) ×3 IMPLANT
PROTECTOR NERVE ULNAR (MISCELLANEOUS) ×3 IMPLANT
SET HNDPC FAN SPRY TIP SCT (DISPOSABLE) ×1 IMPLANT
STRIP CLOSURE SKIN 1/2X4 (GAUZE/BANDAGES/DRESSINGS) ×4 IMPLANT
SUT MNCRL AB 4-0 PS2 18 (SUTURE) ×3 IMPLANT
SUT STRATAFIX 0 PDS 27 VIOLET (SUTURE) ×3
SUT VIC AB 2-0 CT1 27 (SUTURE) ×6
SUT VIC AB 2-0 CT1 TAPERPNT 27 (SUTURE) ×3 IMPLANT
SUTURE STRATFX 0 PDS 27 VIOLET (SUTURE) ×1 IMPLANT
TIBIA ATTUNE KNEE SYS BASE SZ6 (Knees) ×3 IMPLANT
TRAY FOLEY MTR SLVR 16FR STAT (SET/KITS/TRAYS/PACK) ×3 IMPLANT
WATER STERILE IRR 1000ML POUR (IV SOLUTION) ×6 IMPLANT
WRAP KNEE MAXI GEL POST OP (GAUZE/BANDAGES/DRESSINGS) ×3 IMPLANT

## 2020-04-30 NOTE — Transfer of Care (Signed)
Immediate Anesthesia Transfer of Care Note  Patient: Shannon [name]  Procedure(s) Performed: TOTAL KNEE ARTHROPLASTY (Right Knee)  Patient Location: PACU  Anesthesia Type:Spinal  Level of Consciousness: awake, alert  and oriented  Airway & Oxygen Therapy: Patient Spontanous Breathing and Patient connected to face mask oxygen  Post-op Assessment: Report given to RN and Post -op Vital signs reviewed and stable  Post vital signs: Reviewed and stable  Last Vitals:  Vitals Value Taken Time  BP    Temp    Pulse 87 04/30/20 1103  Resp 13 04/30/20 1103  SpO2 100 % 04/30/20 1103  Vitals shown include unvalidated device data.  Last Pain:  Vitals:   04/30/20 0746  TempSrc: Oral  PainSc:       Patients Stated Pain Goal: 4 (04/30/20 0736)  Complications: No complications documented.

## 2020-04-30 NOTE — Progress Notes (Signed)
Orthopedic Tech Progress Note Patient Details:  Shannon [name] 1971-05-03 [id number] Patient already has knee immobilizer. Patient ID: [name], female   DOB: Sep 21, 1970, 49 y.o.   MRN: [id number]   [name] 04/30/2020, 12:33 PM

## 2020-04-30 NOTE — Interval H&P Note (Signed)
History and Physical Interval Note:  04/30/2020 7:10 AM  Shannon [name]  has presented today for surgery, with the diagnosis of right knee osteoarthritis.  The various methods of treatment have been discussed with the patient and family. After consideration of risks, benefits and other options for treatment, the patient has consented to  Procedure(s) with comments: TOTAL KNEE ARTHROPLASTY (Right) - as a surgical intervention.  The patient's history has been reviewed, patient examined, no change in status, stable for surgery.  I have reviewed the patient's chart and labs.  Questions were answered to the patient's satisfaction.     Homero Fellers Jovan Schickling

## 2020-04-30 NOTE — Anesthesia Procedure Notes (Signed)
Spinal  Patient location during procedure: OR Start time: 04/30/2020 9:36 AM End time: 04/30/2020 9:38 AM Staffing Performed: resident/CRNA  Anesthesiologist: Leilani Able, MD Resident/CRNA: Pearson Grippe, CRNA Preanesthetic Checklist Completed: patient identified, IV checked, site marked, risks and benefits discussed, surgical consent, monitors and equipment checked, pre-op evaluation and timeout performed Spinal Block Patient position: sitting Prep: DuraPrep Patient monitoring: heart rate, cardiac monitor, continuous pulse ox and blood pressure Approach: midline Location: L3-4 Injection technique: single-shot Needle Needle type: Sprotte  Needle gauge: 24 G Needle length: 9 cm Assessment Sensory level: T4 Additional Notes Allergies/consent verified. Pt sitting for spinal. Spinal attempt x1. + clear, free-flowing CSF, easy to aspirate/inject. - paresthesia. Level to T6. NAC.

## 2020-04-30 NOTE — Evaluation (Signed)
Physical Therapy Evaluation Patient Details Name: Shannon [name] MRN: [id number] DOB: 01-29-1971 Today's Date: 04/30/2020   History of Present Illness  pt 49 yo s/p RTKA 04/30/2020 with hx of lupus, and s/p LTKA 03/2019  Clinical Impression  Pt is s/p R TKA resulting in the deficits listed below (see PT Problem List).  Pt will benefit from skilled PT to increase their independence and safety with mobility to allow discharge home with family assisting . Pt limited a little today due to pain and reports of dizziness. Will continue to follow.       Follow Up Recommendations Follow surgeon's recommendation for DC plan and follow-up therapies (pt states she has OP visit set up for Thursday)    Equipment Recommendations  None recommended by PT    Recommendations for Other Services       Precautions / Restrictions Precautions Precautions: Knee Required Braces or Orthoses: Knee Immobilizer - Right Knee Immobilizer - Right: On when out of bed or walking;Discontinue once straight leg raise with < 10 degree lag Restrictions Weight Bearing Restrictions: No      Mobility  Bed Mobility Overal bed mobility: Needs Assistance Bed Mobility: Supine to Sit;Sit to Supine     Supine to sit: Min assist;HOB elevated Sit to supine: Min assist;HOB elevated   General bed mobility comments: pt used rails and overhead trapeze to assist a lot . PT asisted with R LE    Transfers Overall transfer level:  (didn't attemtp today due to pain and reports of dizziness sitting EOB.)                  Ambulation/Gait                Stairs            Wheelchair Mobility    Modified Rankin (Stroke Patients Only)       Balance Overall balance assessment: No apparent balance deficits (not formally assessed)                                           Pertinent Vitals/Pain Pain Assessment: 0-10 Pain Score: 8  Pain Location: r knee Pain Descriptors / Indicators:  Aching;Burning;Sore Pain Intervention(s): Limited activity within patient's tolerance;Monitored during session;RN gave pain meds during session;Ice applied    Home Living Family/patient expects to be discharged to:: Private residence Living Arrangements: Spouse/significant other;Children (pt states her 18 child, husband and maybe a niece with assist with her care after DC) Available Help at Discharge: Family Type of Home: House Home Access: Stairs to enter Entrance Stairs-Rails: None Entrance Stairs-Number of Steps: 3 no rail Home Layout: Two level;Bed/bath upstairs;Able to live on main level with bedroom/bathroom Home Equipment: Dan Humphreys - 2 wheels;Cane - single point;Bedside commode      Prior Function Level of Independence: Independent         Comments: occassionally used cane when her R knee was hurting too bad     Hand Dominance        Extremity/Trunk Assessment        Lower Extremity Assessment Lower Extremity Assessment: RLE deficits/detail RLE Deficits / Details: limted due to pain , however grossly 5-50 dgrees supine       Communication   Communication: No difficulties  Cognition Arousal/Alertness: Awake/alert Behavior During Therapy: WFL for tasks assessed/performed Overall Cognitive Status: Within Functional Limits for tasks assessed  General Comments      Exercises Total Joint Exercises Ankle Circles/Pumps: AROM;Both;10 reps;Supine Quad Sets: AROM;Right;10 reps;Supine Heel Slides: AAROM;Right;10 reps;Supine Hip ABduction/ADduction: AAROM;Right;10 reps;Supine Straight Leg Raises: AAROM;Right;10 reps Goniometric ROM: 5-40   Assessment/Plan    PT Assessment Patient needs continued PT services  PT Problem List Decreased strength;Decreased mobility;Decreased range of motion;Decreased activity tolerance       PT Treatment Interventions DME instruction;Therapeutic exercise;Gait training;Stair  training;Functional mobility training;Therapeutic activities;Patient/family education    PT Goals (Current goals can be found in the Care Plan section)  Acute Rehab PT Goals Patient Stated Goal: I can't wait to walk around better with less pain PT Goal Formulation: With patient Time For Goal Achievement: 05/14/20 Potential to Achieve Goals: Good    Frequency 7X/week   Barriers to discharge        Co-evaluation               AM-PAC PT "6 Clicks" Mobility  Outcome Measure Help needed turning from your back to your side while in a flat bed without using bedrails?: A Little Help needed moving from lying on your back to sitting on the side of a flat bed without using bedrails?: A Little Help needed moving to and from a bed to a chair (including a wheelchair)?: A Little Help needed standing up from a chair using your arms (e.g., wheelchair or bedside chair)?: A Lot Help needed to walk in hospital room?: A Lot Help needed climbing 3-5 steps with a railing? : A Lot 6 Click Score: 15    End of Session   Activity Tolerance: Patient limited by pain Patient left: in bed;with call bell/phone within reach;with family/visitor present Nurse Communication: Mobility status PT Visit Diagnosis: Other abnormalities of gait and mobility (R26.89)    Time: 1630-1700 PT Time Calculation (min) (ACUTE ONLY): 30 min   Charges:   PT Evaluation $PT Eval Low Complexity: 1 Low PT Treatments $Therapeutic Exercise: 8-22 mins        Thai Hemrick, PT, MPT Acute Rehabilitation Services Office: [phone] Pager: [phone] 04/30/2020   Marella Bile 04/30/2020, 5:20 PM

## 2020-04-30 NOTE — Discharge Instructions (Addendum)
 Frank Aluisio, MD Total Joint Specialist [facility] [address] [place], [place] [address] [phone]  TOTAL KNEE REPLACEMENT POSTOPERATIVE DIRECTIONS    Knee Rehabilitation, Guidelines Following Surgery  Results after knee surgery are often greatly improved when you follow the exercise, range of motion and muscle strengthening exercises prescribed by your doctor. Safety measures are also important to protect the knee from further injury. If any of these exercises cause you to have increased pain or swelling in your knee joint, decrease the amount until you are comfortable again and slowly increase them. If you have problems or questions, call your caregiver or physical therapist for advice.   BLOOD CLOT PREVENTION . Take a 10 mg Xarelto once a day for three weeks following surgery.  . You may resume your vitamins/supplements once you have discontinued the Xarelto. . Do not take any NSAIDs (Advil, Aleve, Ibuprofen, Meloxicam, etc.) until you have discontinued the Xarelto.   HOME CARE INSTRUCTIONS  . Remove items at home which could result in a fall. This includes throw rugs or furniture in walking pathways.  . ICE to the affected knee as much as tolerated. Icing helps control swelling. If the swelling is well controlled you will be more comfortable and rehab easier. Continue to use ice on the knee for pain and swelling from surgery. You may notice swelling that will progress down to the foot and ankle. This is normal after surgery. Elevate the leg when you are not up walking on it.    . Continue to use the breathing machine which will help keep your temperature down. It is common for your temperature to cycle up and down following surgery, especially at night when you are not up moving around and exerting yourself. The breathing machine keeps your lungs expanded and your temperature down. . Do not place pillow under the operative knee, focus on keeping the  knee straight while resting  DIET You may resume your previous home diet once you are discharged from the hospital.  DRESSING / WOUND CARE / SHOWERING . Keep your bulky bandage on for 2 days. On the third post-operative day you may remove the Ace bandage and gauze. There is a waterproof adhesive bandage on your skin which will stay in place until your first follow-up appointment. Once you remove this you will not need to place another bandage . You may begin showering 3 days following surgery, but do not submerge the incision under water.  ACTIVITY For the first 5 days, the key is rest and control of pain and swelling . Do your home exercises twice a day starting on post-operative day 3. On the days you go to physical therapy, just do the home exercises once that day. . You should rest, ice and elevate the leg for 50 minutes out of every hour. Get up and walk/stretch for 10 minutes per hour. After 5 days you can increase your activity slowly as tolerated. . Walk with your walker as instructed. Use the walker until you are comfortable transitioning to a cane. Walk with the cane in the opposite hand of the operative leg. You may discontinue the cane once you are comfortable and walking steadily. . Avoid periods of inactivity such as sitting longer than an hour when not asleep. This helps prevent blood clots.  . You may discontinue the knee immobilizer once you are able to perform a straight leg raise while lying down. . You may resume a sexual relationship in one month   or when given the OK by your doctor.  . You may return to work once you are cleared by your doctor.  . Do not drive a car for 6 weeks or until released by your surgeon.  . Do not drive while taking narcotics.  TED HOSE STOCKINGS Wear the elastic stockings on both legs for three weeks following surgery during the day. You may remove them at night for sleeping.  WEIGHT BEARING Weight bearing as tolerated with assist device  (walker, cane, etc) as directed, use it as long as suggested by your surgeon or therapist, typically at least 4-6 weeks.  POSTOPERATIVE CONSTIPATION PROTOCOL Constipation - defined medically as fewer than three stools per week and severe constipation as less than one stool per week.  One of the most common issues patients have following surgery is constipation.  Even if you have a regular bowel pattern at home, your normal regimen is likely to be disrupted due to multiple reasons following surgery.  Combination of anesthesia, postoperative narcotics, change in appetite and fluid intake all can affect your bowels.  In order to avoid complications following surgery, here are some recommendations in order to help you during your recovery period.  . Colace (docusate) - Pick up an over-the-counter form of Colace or another stool softener and take twice a day as long as you are requiring postoperative pain medications.  Take with a full glass of water daily.  If you experience loose stools or diarrhea, hold the colace until you stool forms back up. If your symptoms do not get better within 1 week or if they get worse, check with your doctor. . Dulcolax (bisacodyl) - Pick up over-the-counter and take as directed by the product packaging as needed to assist with the movement of your bowels.  Take with a full glass of water.  Use this product as needed if not relieved by Colace only.  . MiraLax (polyethylene glycol) - Pick up over-the-counter to have on hand. MiraLax is a solution that will increase the amount of water in your bowels to assist with bowel movements.  Take as directed and can mix with a glass of water, juice, soda, coffee, or tea. Take if you go more than two days without a movement. Do not use MiraLax more than once per day. Call your doctor if you are still constipated or irregular after using this medication for 7 days in a row.  If you continue to have problems with postoperative constipation,  please contact the office for further assistance and recommendations.  If you experience "the worst abdominal pain ever" or develop nausea or vomiting, please contact the office immediatly for further recommendations for treatment.  ITCHING If you experience itching with your medications, try taking only a single pain pill, or even half a pain pill at a time.  You can also use Benadryl over the counter for itching or also to help with sleep.   MEDICATIONS See your medication summary on the "After Visit Summary" that the nursing staff will review with you prior to discharge.  You may have some home medications which will be placed on hold until you complete the course of blood thinner medication.  It is important for you to complete the blood thinner medication as prescribed by your surgeon.  Continue your approved medications as instructed at time of discharge.  PRECAUTIONS . If you experience chest pain or shortness of breath - call 911 immediately for transfer to the hospital emergency department.  . If   you develop a fever greater that 101 F, purulent drainage from wound, increased redness or drainage from wound, foul odor from the wound/dressing, or calf pain - CONTACT YOUR SURGEON.                                                   FOLLOW-UP APPOINTMENTS Make sure you keep all of your appointments after your operation with your surgeon and caregivers. You should call the office at the above phone number and make an appointment for approximately two weeks after the date of your surgery or on the date instructed by your surgeon outlined in the "After Visit Summary".  RANGE OF MOTION AND STRENGTHENING EXERCISES  Rehabilitation of the knee is important following a knee injury or an operation. After just a few days of immobilization, the muscles of the thigh which control the knee become weakened and shrink (atrophy). Knee exercises are designed to build up the tone and strength of the thigh muscles  and to improve knee motion. Often times heat used for twenty to thirty minutes before working out will loosen up your tissues and help with improving the range of motion but do not use heat for the first two weeks following surgery. These exercises can be done on a training (exercise) mat, on the floor, on a table or on a bed. Use what ever works the best and is most comfortable for you Knee exercises include:  . Leg Lifts - While your knee is still immobilized in a splint or cast, you can do straight leg raises. Lift the leg to 60 degrees, hold for 3 sec, and slowly lower the leg. Repeat 10-20 times 2-3 times daily. Perform this exercise against resistance later as your knee gets better.  . Quad and Hamstring Sets - Tighten up the muscle on the front of the thigh (Quad) and hold for 5-10 sec. Repeat this 10-20 times hourly. Hamstring sets are done by pushing the foot backward against an object and holding for 5-10 sec. Repeat as with quad sets.   Leg Slides: Lying on your back, slowly slide your foot toward your buttocks, bending your knee up off the floor (only go as far as is comfortable). Then slowly slide your foot back down until your leg is flat on the floor again.  Angel Wings: Lying on your back spread your legs to the side as far apart as you can without causing discomfort.  A rehabilitation program following serious knee injuries can speed recovery and prevent re-injury in the future due to weakened muscles. Contact your doctor or a physical therapist for more information on knee rehabilitation.   IF YOU ARE TRANSFERRED TO A SKILLED REHAB FACILITY If the patient is transferred to a skilled rehab facility following release from the hospital, a list of the current medications will be sent to the facility for the patient to continue.  When discharged from the skilled rehab facility, please have the facility set up the patient's Home Health Physical Therapy prior to being released. Also, the skilled  facility will be responsible for providing the patient with their medications at time of release from the facility to include their pain medication, the muscle relaxants, and their blood thinner medication. If the patient is still at the rehab facility at time of the two week follow up appointment, the skilled rehab facility   will also need to assist the patient in arranging follow up appointment in our office and any transportation needs.  MAKE SURE YOU:  . Understand these instructions.  . Get help right away if you are not doing well or get worse.   DENTAL ANTIBIOTICS:  In most cases prophylactic antibiotics for Dental procdeures after total joint surgery are not necessary.  Exceptions are as follows:  1. History of prior total joint infection  2. Severely immunocompromised (Organ Transplant, cancer chemotherapy, Rheumatoid biologic meds such as Humera)  3. Poorly controlled diabetes (A1C &gt; 8.0, blood glucose over 200)  If you have one of these conditions, contact your surgeon for an antibiotic prescription, prior to your dental procedure.    Pick up stool softner and laxative for home use following surgery while on pain medications. Do not submerge incision under water. Please use good hand washing techniques while changing dressing each day. May shower starting three days after surgery. Please use a clean towel to pat the incision dry following showers. Continue to use ice for pain and swelling after surgery. Do not use any lotions or creams on the incision until instructed by your surgeon.  Information on my medicine - XARELTO (Rivaroxaban)   Why was Xarelto prescribed for you? Xarelto was prescribed for you to reduce the risk of blood clots forming after orthopedic surgery. The medical term for these abnormal blood clots is venous thromboembolism (VTE).  What do you need to know about xarelto ? Take your Xarelto ONCE DAILY at the same time every day. You may take  it either with or without food.  If you have difficulty swallowing the tablet whole, you may crush it and mix in applesauce just prior to taking your dose.  Take Xarelto exactly as prescribed by your doctor and DO NOT stop taking Xarelto without talking to the doctor who prescribed the medication.  Stopping without other VTE prevention medication to take the place of Xarelto may increase your risk of developing a clot.  After discharge, you should have regular check-up appointments with your healthcare provider that is prescribing your Xarelto.    What do you do if you miss a dose? If you miss a dose, take it as soon as you remember on the same day then continue your regularly scheduled once daily regimen the next day. Do not take two doses of Xarelto on the same day.   Important Safety Information A possible side effect of Xarelto is bleeding. You should call your healthcare provider right away if you experience any of the following: ? Bleeding from an injury or your nose that does not stop. ? Unusual colored urine (red or dark brown) or unusual colored stools (red or black). ? Unusual bruising for unknown reasons. ? A serious fall or if you hit your head (even if there is no bleeding).  Some medicines may interact with Xarelto and might increase your risk of bleeding while on Xarelto. To help avoid this, consult your healthcare provider or pharmacist prior to using any new prescription or non-prescription medications, including herbals, vitamins, non-steroidal anti-inflammatory drugs (NSAIDs) and supplements.  This website has more information on Xarelto: www.xarelto.com.   

## 2020-04-30 NOTE — Anesthesia Procedure Notes (Signed)
Anesthesia Regional Block: Adductor canal block   Pre-Anesthetic Checklist: ,, timeout performed, Correct Patient, Correct Site, Correct Laterality, Correct Procedure, Correct Position, site marked, Risks and benefits discussed,  Surgical consent,  Pre-op evaluation,  At surgeon's request and post-op pain management  Laterality: Lower and Right  Prep: chloraprep       Needles:  Injection technique: Single-shot  Needle Type: Echogenic Stimulator Needle     Needle Length: 9cm  Needle Gauge: 20   Needle insertion depth: 5.5 cm   Additional Needles:   Procedures:,,,, ultrasound used (permanent image in chart),,,,  Narrative:  Start time: 04/30/2020 9:15 AM End time: 04/30/2020 9:23 AM Injection made incrementally with aspirations every 5 mL. Anesthesiologist: Leilani Able, MD

## 2020-04-30 NOTE — Anesthesia Preprocedure Evaluation (Signed)
Anesthesia Evaluation  Patient identified by MRN, date of birth, ID band Patient awake    Reviewed: Allergy & Precautions, NPO status , Patient's Chart, lab work & pertinent test results  Airway Mallampati: III       Dental no notable dental hx. (+) Teeth Intact   Pulmonary asthma , former smoker,  H/o ILD   Pulmonary exam normal        Cardiovascular hypertension, Pt. on medications Normal cardiovascular exam  TTE 2019 - Left ventricle: The cavity size was normal. Wall thickness was normal. Systolic function was normal. The estimated ejection fraction was in the range of 55% to 60%. Wall motion was normal; there were no regional wall motion abnormalities. Features are consistent with a pseudonormal left ventricular filling pattern, with concomitant abnormal relaxation and increased filling pressure (grade 2 diastolic dysfunction). - Mitral valve: There was mild regurgitation. - Left atrium: The atrium was mildly dilated. - Right atrium: The atrium was mildly dilated. - Pulmonary arteries: Systolic pressure was mildly increased. PA peak pressure: 35 mm Hg (S).  Stress Test 2016 Conclusion: Exercise testing with gas-exchange demonstrates a severe functional impairment when compared to matched sedentary norms. At peak exercise, it appears the patient is severely Limited. Despite having restrictive lung disease with ILD she did  not have ventilatory limitation or desaturation though she was somewhat tachypneic compared to norm. Limitation could be circulatory with excess deadspace suggestive of this. Clinical correlation recommended   RHC 2019 RA mean 3 RV 30/6 PA 23/8, mean 14 PCWP mean 8 Oxygen saturations: PA 72% AO 98% Cardiac Output (Fick) 6.73  Cardiac Index (Fick) 3.06 Cardiac Output (Thermo) 6.94 Cardiac Index (Thermo) 3.15   Neuro/Psych PSYCHIATRIC DISORDERS Anxiety Depression negative neurological ROS      GI/Hepatic negative GI ROS, Neg liver ROS,   Endo/Other  Morbid obesitySLE  Renal/GU negative Renal ROSG6PD deficiency  negative genitourinary   Musculoskeletal  (+) Arthritis , Rheumatoid disorders,    Abdominal (+) + obese,   Peds  Hematology  (+) Blood dyscrasia (Hgb 10), anemia ,   Anesthesia Other Findings   Reproductive/Obstetrics                             Anesthesia Physical  Anesthesia Plan  ASA: III  Anesthesia Plan: Spinal and Regional   Post-op Pain Management:  Regional for Post-op pain   Induction:   PONV Risk Score and Plan: 2 and Treatment may vary due to age or medical condition and Propofol infusion  Airway Management Planned: Natural Airway and Simple Face Mask  Additional Equipment: None  Intra-op Plan:   Post-operative Plan:   Informed Consent: I have reviewed the patients History and Physical, chart, labs and discussed the procedure including the risks, benefits and alternatives for the proposed anesthesia with the patient or authorized representative who has indicated his/her understanding and acceptance.       Plan Discussed with: CRNA  Anesthesia Plan Comments:         Anesthesia Quick Evaluation

## 2020-04-30 NOTE — Progress Notes (Signed)
Assisted Dr. Hatchett with right, ultrasound guided, adductor canal block. Side rails up, monitors on throughout procedure. See vital signs in flow sheet. Tolerated Procedure well.  

## 2020-04-30 NOTE — Anesthesia Postprocedure Evaluation (Signed)
Anesthesia Post Note  Patient: Shannon [name]  Procedure(s) Performed: TOTAL KNEE ARTHROPLASTY (Right Knee)     Patient location during evaluation: PACU Anesthesia Type: Regional Level of consciousness: awake Pain management: pain level controlled Vital Signs Assessment: post-procedure vital signs reviewed and stable Respiratory status: spontaneous breathing Cardiovascular status: stable Postop Assessment: no headache, no backache, spinal receding and no apparent nausea or vomiting Anesthetic complications: no   No complications documented.  Last Vitals:  Vitals:   04/30/20 1130 04/30/20 1145  BP: 118/75 112/76  Pulse: 78 79  Resp: 13 11  Temp:    SpO2: 100% 100%    Last Pain:  Vitals:   04/30/20 1130  TempSrc:   PainSc: 0-No pain                 Caren Macadam

## 2020-04-30 NOTE — Progress Notes (Signed)
Orthopedic Tech Progress Note Patient Details:  Shannon [name] [birth year] [id number]  CPM Right Knee CPM Right Knee: On Right Knee Flexion (Degrees): 40 Right Knee Extension (Degrees): 0  Post Interventions Patient Tolerated: Well Instructions Provided: Care of device Ortho Devices Ortho Device/Splint Location: applied ovehrehead frame to bed Ortho Device/Splint Interventions: Application, Ordered, Adjustment   Post Interventions Patient Tolerated: Well Instructions Provided: Care of device   [name] 04/30/2020, 11:24 AM

## 2020-04-30 NOTE — Progress Notes (Signed)
Orthopedic Tech Progress Note Patient Details:  Shannon [name] Jun 02, 1971 [id number]  CPM Right Knee CPM Right Knee: Off Right Knee Flexion (Degrees): 40 Right Knee Extension (Degrees): 0  Post Interventions Patient Tolerated: Well Instructions Provided: Care of device  Saul Fordyce 04/30/2020, 2:29 PM

## 2020-04-30 NOTE — Plan of Care (Signed)
  Problem: Education: Goal: Knowledge of General Education information will improve Description Including pain rating scale, medication(s)/side effects and non-pharmacologic comfort measures Outcome: Progressing   Problem: Nutrition: Goal: Adequate nutrition will be maintained Outcome: Progressing   Problem: Pain Managment: Goal: General experience of comfort will improve Outcome: Progressing   

## 2020-04-30 NOTE — Op Note (Signed)
OPERATIVE REPORT-TOTAL KNEE ARTHROPLASTY   Pre-operative diagnosis- Osteoarthritis  Right knee(s)  Post-operative diagnosis- Osteoarthritis Right knee(s)  Procedure-  Right  Total Knee Arthroplasty  Surgeon- Gus Rankin. Kaisyn Reinhold, MD  Assistant- Arther Abbott, PA-C   Anesthesia-  Adductor canal block and spinal  EBL-100 mL   Drains None  Tourniquet time-  Total Tourniquet Time Documented: Thigh (Right) - 39 minutes Total: Thigh (Right) - 39 minutes     Complications- None  Condition-PACU - hemodynamically stable.   Brief Clinical Note  Shannon [name] is a 49 y.o. year old female with end stage OA of her right knee with progressively worsening pain and dysfunction. She has constant pain, with activity and at rest and significant functional deficits with difficulties even with ADLs. She has had extensive non-op management including analgesics, injections of cortisone and viscosupplements, and home exercise program, but remains in significant pain with significant dysfunction.Radiographs show bone on bone arthritis patellofemoral. She presents now for right Total Knee Arthroplasty.    Procedure in detail---   The patient is brought into the operating room and positioned supine on the operating table. After successful administration of  Adductor canal block and spinal,   a tourniquet is placed high on the  Right thigh(s) and the lower extremity is prepped and draped in the usual sterile fashion. Time out is performed by the operating team and then the  Right lower extremity is wrapped in Esmarch, knee flexed and the tourniquet inflated to 300 mmHg.       A midline incision is made with a ten blade through the subcutaneous tissue to the level of the extensor mechanism. A fresh blade is used to make a medial parapatellar arthrotomy. Soft tissue over the proximal medial tibia is subperiosteally elevated to the joint line with a knife and into the semimembranosus bursa with a Cobb  elevator. Soft tissue over the proximal lateral tibia is elevated with attention being paid to avoiding the patellar tendon on the tibial tubercle. The patella is everted, knee flexed 90 degrees and the ACL and PCL are removed. Findings are bone on bone patellofemoral        The drill is used to create a starting hole in the distal femur and the canal is thoroughly irrigated with sterile saline to remove the fatty contents. The 5 degree Right  valgus alignment guide is placed into the femoral canal and the distal femoral cutting block is pinned to remove 9 mm off the distal femur. Resection is made with an oscillating saw.      The tibia is subluxed forward and the menisci are removed. The extramedullary alignment guide is placed referencing proximally at the medial aspect of the tibial tubercle and distally along the second metatarsal axis and tibial crest. The block is pinned to remove 46mm off the more deficient medial  side. Resection is made with an oscillating saw. Size 6is the most appropriate size for the tibia and the proximal tibia is prepared with the modular drill and keel punch for that size.      The femoral sizing guide is placed and size 7 is most appropriate. Rotation is marked off the epicondylar axis and confirmed by creating a rectangular flexion gap at 90 degrees. The size 7 cutting block is pinned in this rotation and the anterior, posterior and chamfer cuts are made with the oscillating saw. The intercondylar block is then placed and that cut is made.      Trial size 6 tibial component,  trial size 7 posterior stabilized femur and a 6  mm posterior stabilized rotating platform insert trial is placed. Full extension is achieved with excellent varus/valgus and anterior/posterior balance throughout full range of motion. The patella is everted and thickness measured to be 22  mm. Free hand resection is taken to 12 mm, a 38 template is placed, lug holes are drilled, trial patella is placed, and  it tracks normally. Osteophytes are removed off the posterior femur with the trial in place. All trials are removed and the cut bone surfaces prepared with pulsatile lavage. Cement is mixed and once ready for implantation, the size 6 tibial implant, size  7 posterior stabilized femoral component, and the size 38 patella are cemented in place and the patella is held with the clamp. The trial insert is placed and the knee held in full extension. The Exparel (20 ml mixed with 60 ml saline) is injected into the extensor mechanism, posterior capsule, medial and lateral gutters and subcutaneous tissues.  All extruded cement is removed and once the cement is hard the permanent 6 mm posterior stabilized rotating platform insert is placed into the tibial tray.      The wound is copiously irrigated with saline solution and the extensor mechanism closed with # 0 Stratofix suture. The tourniquet is released for a total tourniquet time of 39  minutes. Flexion against gravity is 140 degrees and the patella tracks normally. Subcutaneous tissue is closed with 2.0 vicryl and subcuticular with running 4.0 Monocryl. The incision is cleaned and dried and steri-strips and a bulky sterile dressing are applied. The limb is placed into a knee immobilizer and the patient is awakened and transported to recovery in stable condition.      Please note that a surgical assistant was a medical necessity for this procedure in order to perform it in a safe and expeditious manner. Surgical assistant was necessary to retract the ligaments and vital neurovascular structures to prevent injury to them and also necessary for proper positioning of the limb to allow for anatomic placement of the prosthesis.   Gus Rankin Caylan Schifano, MD    04/30/2020, 10:39 AM

## 2020-04-30 NOTE — Plan of Care (Signed)
  Problem: Education: Goal: Knowledge of General Education information will improve Description: Including pain rating scale, medication(s)/side effects and non-pharmacologic comfort measures Outcome: Progressing   Problem: Health Behavior/Discharge Planning: Goal: Ability to manage health-related needs will improve Outcome: Progressing   Problem: Clinical Measurements: Goal: Ability to maintain clinical measurements within normal limits will improve Outcome: Progressing Goal: Will remain free from infection Outcome: Progressing   Problem: Activity: Goal: Risk for activity intolerance will decrease Outcome: Progressing   Problem: Nutrition: Goal: Adequate nutrition will be maintained Outcome: Progressing   Problem: Elimination: Goal: Will not experience complications related to bowel motility Outcome: Progressing   Problem: Pain Managment: Goal: General experience of comfort will improve Outcome: Progressing   Problem: Skin Integrity: Goal: Risk for impaired skin integrity will decrease Outcome: Progressing   Problem: Education: Goal: Knowledge of the prescribed therapeutic regimen will improve Outcome: Progressing Goal: Individualized Educational Video(s) Outcome: Progressing   Problem: Activity: Goal: Ability to avoid complications of mobility impairment will improve Outcome: Progressing

## 2020-04-30 NOTE — Care Plan (Signed)
Ortho Bundle Case Management Note  Patient Details  Name: Shannon [name] MRN: [id number] Date of Birth: Sep 11, 1970                  R TKA scheduled on 04/30/20. DCP: Home with spouse. 2 story home with 3 steps. DME: No needs. Has RW & 3in1. PT: [facility] 11/11   DME Arranged:  N/A DME Agency:     HH Arranged:    HH Agency:     Additional Comments: Please contact me with any questions of if this plan should need to change.  Ennis Forts, RN,CCM [facility]  [phone] 04/30/2020, 12:25 PM

## 2020-05-01 ENCOUNTER — Encounter (HOSPITAL_COMMUNITY): Payer: Self-pay | Admitting: Orthopedic Surgery

## 2020-05-01 DIAGNOSIS — Z79899 Other long term (current) drug therapy: Secondary | ICD-10-CM | POA: Diagnosis not present

## 2020-05-01 DIAGNOSIS — M25761 Osteophyte, right knee: Secondary | ICD-10-CM | POA: Diagnosis not present

## 2020-05-01 DIAGNOSIS — Z886 Allergy status to analgesic agent status: Secondary | ICD-10-CM | POA: Diagnosis not present

## 2020-05-01 DIAGNOSIS — Z7951 Long term (current) use of inhaled steroids: Secondary | ICD-10-CM | POA: Diagnosis not present

## 2020-05-01 DIAGNOSIS — Z880 Allergy status to penicillin: Secondary | ICD-10-CM | POA: Diagnosis not present

## 2020-05-01 DIAGNOSIS — M1711 Unilateral primary osteoarthritis, right knee: Secondary | ICD-10-CM | POA: Diagnosis not present

## 2020-05-01 DIAGNOSIS — Z882 Allergy status to sulfonamides status: Secondary | ICD-10-CM | POA: Diagnosis not present

## 2020-05-01 DIAGNOSIS — Z87891 Personal history of nicotine dependence: Secondary | ICD-10-CM | POA: Diagnosis not present

## 2020-05-01 LAB — CBC
HCT: 31.9 % — ABNORMAL LOW (ref 36.0–46.0)
Hemoglobin: 9.9 g/dL — ABNORMAL LOW (ref 12.0–15.0)
MCH: 29.9 pg (ref 26.0–34.0)
MCHC: 31 g/dL (ref 30.0–36.0)
MCV: 96.4 fL (ref 80.0–100.0)
Platelets: 230 10*3/uL (ref 150–400)
RBC: 3.31 MIL/uL — ABNORMAL LOW (ref 3.87–5.11)
RDW: 12.5 % (ref 11.5–15.5)
WBC: 8.8 10*3/uL (ref 4.0–10.5)
nRBC: 0 % (ref 0.0–0.2)

## 2020-05-01 LAB — BASIC METABOLIC PANEL
Anion gap: 11 (ref 5–15)
BUN: 18 mg/dL (ref 6–20)
CO2: 25 mmol/L (ref 22–32)
Calcium: 9.1 mg/dL (ref 8.9–10.3)
Chloride: 100 mmol/L (ref 98–111)
Creatinine, Ser: 0.83 mg/dL (ref 0.44–1.00)
GFR, Estimated: >60 mL/min (ref >60)
Glucose, Bld: 109 mg/dL — ABNORMAL HIGH (ref 70–99)
Potassium: 3.4 mmol/L — ABNORMAL LOW (ref 3.5–5.1)
Sodium: 136 mmol/L (ref 135–145)

## 2020-05-01 MED ORDER — POTASSIUM CHLORIDE CRYS ER 20 MEQ PO TBCR
40.0000 meq | EXTENDED_RELEASE_TABLET | ORAL | Status: AC
  Administered 2020-05-01 (×2): 40 meq via ORAL
  Filled 2020-05-01 (×2): qty 2

## 2020-05-01 MED ORDER — RIVAROXABAN 10 MG PO TABS: 10.0000 mg | ORAL_TABLET | Freq: Every day | ORAL | 0 refills | Status: DC

## 2020-05-01 MED ORDER — METHOCARBAMOL 500 MG PO TABS
500.0000 mg | ORAL_TABLET | Freq: Four times a day (QID) | ORAL | 0 refills | Status: DC | PRN
Start: 2020-05-01 — End: 2020-09-20

## 2020-05-01 MED ORDER — GABAPENTIN 300 MG PO CAPS: ORAL_CAPSULE | ORAL | 0 refills | Status: DC

## 2020-05-01 NOTE — Progress Notes (Signed)
Physical Therapy Treatment Patient Details Name: Shannon [name] MRN: [id number] DOB: 03/02/1971 Today's Date: 05/01/2020    History of Present Illness pt 49 yo s/p RTKA 04/30/2020 with hx of lupus, and s/p LTKA 03/2019    PT Comments    Pt performed LE exercises in supine.  Pt attempted to ambulate however became dizzy, BP 125/86 mmHg, however first time standing and ambulating since surgery.  Follow Up Recommendations  Follow surgeon's recommendation for DC plan and follow-up therapies     Equipment Recommendations  None recommended by PT    Recommendations for Other Services       Precautions / Restrictions Precautions Precautions: Knee Required Braces or Orthoses: Knee Immobilizer - Right Knee Immobilizer - Right: Discontinue once straight leg raise with < 10 degree lag Restrictions Weight Bearing Restrictions: No    Mobility  Bed Mobility Overal bed mobility: Needs Assistance Bed Mobility: Supine to Sit     Supine to sit: Min guard;HOB elevated     General bed mobility comments: pt used rails and overhead trapeze to assist  Transfers Overall transfer level: Needs assistance Equipment used: Rolling walker (2 wheeled) Transfers: Sit to/from Stand Sit to Stand: Min assist         General transfer comment: verbal cues for UE and LE positioning, assist to rise and steady  Ambulation/Gait Ambulation/Gait assistance: Min guard Gait Distance (Feet): 4 Feet Assistive device: Rolling walker (2 wheeled) Gait Pattern/deviations: Step-to pattern;Antalgic;Decreased stance time - right     General Gait Details: verbal cues for sequence, RW positioning, step length; pt reports increased dizziness so took steps backwards to recliner; BP 121/86 bpm   Stairs             Wheelchair Mobility    Modified Rankin (Stroke Patients Only)       Balance                                            Cognition Arousal/Alertness:  Awake/alert Behavior During Therapy: WFL for tasks assessed/performed Overall Cognitive Status: Within Functional Limits for tasks assessed                                        Exercises Total Joint Exercises Ankle Circles/Pumps: AROM;Both;10 reps;Supine Quad Sets: AROM;Right;10 reps;Supine Short Arc Quad: AAROM;Right;10 reps;Supine Heel Slides: AAROM;Right;10 reps;Supine Hip ABduction/ADduction: AAROM;Right;10 reps;Supine Straight Leg Raises: AAROM;Right;10 reps;Supine    General Comments        Pertinent Vitals/Pain Pain Assessment: 0-10 Pain Score: 5  Pain Location: right knee Pain Descriptors / Indicators: Aching;Burning;Sore Pain Intervention(s): Repositioned;Monitored during session;Ice applied    Home Living                      Prior Function            PT Goals (current goals can now be found in the care plan section) Progress towards PT goals: Progressing toward goals    Frequency    7X/week      PT Plan Current plan remains appropriate    Co-evaluation              AM-PAC PT "6 Clicks" Mobility   Outcome Measure  Help needed turning from your back to your side while in a flat  bed without using bedrails?: A Little Help needed moving from lying on your back to sitting on the side of a flat bed without using bedrails?: A Little Help needed moving to and from a bed to a chair (including a wheelchair)?: A Little Help needed standing up from a chair using your arms (e.g., wheelchair or bedside chair)?: A Little Help needed to walk in hospital room?: A Little Help needed climbing 3-5 steps with a railing? : A Lot 6 Click Score: 17    End of Session Equipment Utilized During Treatment: Gait belt Activity Tolerance: Patient limited by pain (and dizziness) Patient left: with call bell/phone within reach;in chair;with chair alarm set;with nursing/sitter in room Nurse Communication: Mobility status PT Visit Diagnosis:  Other abnormalities of gait and mobility (R26.89)     Time: 3818-2993 PT Time Calculation (min) (ACUTE ONLY): 25 min  Charges:  $Gait Training: 8-22 mins $Therapeutic Exercise: 8-22 mins                     Thomasene Mohair PT, DPT Acute Rehabilitation Services Pager: [phone] Office: [phone]  Maida Sale E 05/01/2020, 12:33 PM

## 2020-05-01 NOTE — Plan of Care (Signed)

## 2020-05-01 NOTE — Progress Notes (Signed)
Physical Therapy Treatment Patient Details Name: Shannon [name] MRN: [id number] DOB: 1970-09-25 Today's Date: 05/01/2020    History of Present Illness pt 49 yo s/p RTKA 04/30/2020 with hx of lupus, and s/p LTKA 03/2019    PT Comments    Pt attempted to ambulate however reported not feeling well, became hot and SOB.  Pt requested assist back to bed.  Pt does not appear ready for d/c home today.  Pt will need to practice steps prior to d/c.    Follow Up Recommendations  Follow surgeons recommendation for DC plan and follow-up therapies     Equipment Recommendations  None recommended by PT    Recommendations for Other Services       Precautions / Restrictions Precautions Precautions: Knee;Fall Required Braces or Orthoses: Knee Immobilizer - Right Knee Immobilizer - Right: Discontinue once straight leg raise with < 10 degree lag Restrictions Weight Bearing Restrictions: No    Mobility  Bed Mobility Overal bed mobility: Needs Assistance Bed Mobility: Supine to Sit;Sit to Supine     Supine to sit: Min assist Sit to supine: Min assist   General bed mobility comments: assist for R LE for pain control  Transfers Overall transfer level: Needs assistance Equipment used: Rolling walker (2 wheeled) Transfers: Sit to/from Stand Sit to Stand: Min assist         General transfer comment: verbal cues for UE and LE positioning, assist to rise and steady, control descent  Ambulation/Gait Ambulation/Gait assistance: Min assist Gait Distance (Feet): 8 Feet Assistive device: Rolling walker (2 wheeled) Gait Pattern/deviations: Step-to pattern;Antalgic;Decreased stance time - right     General Gait Details: verbal cues for sequence, RW positioning, step length; pt became hot and mildly SOB, recliner pulled behind pt for safety; SPO2 95% and HR 112 bpm (pt used her inhaler from home, RN aware)   Stairs             Wheelchair Mobility    Modified Rankin (Stroke  Patients Only)       Balance                                            Cognition Arousal/Alertness: Awake/alert Behavior During Therapy: WFL for tasks assessed/performed Overall Cognitive Status: Within Functional Limits for tasks assessed                                        Exercises    General Comments        Pertinent Vitals/Pain Pain Assessment: 0-10 Pain Score: 7  Pain Location: right knee Pain Descriptors / Indicators: Aching;Burning;Sore Pain Intervention(s): Monitored during session;Repositioned;Premedicated before session;Ice applied    Home Living                      Prior Function            PT Goals (current goals can now be found in the care plan section) Progress towards PT goals: Progressing toward goals    Frequency    7X/week      PT Plan Current plan remains appropriate    Co-evaluation              AM-PAC PT "6 Clicks" Mobility   Outcome Measure  Help needed turning from your  back to your side while in a flat bed without using bedrails?: A Little Help needed moving from lying on your back to sitting on the side of a flat bed without using bedrails?: A Little Help needed moving to and from a bed to a chair (including a wheelchair)?: A Little Help needed standing up from a chair using your arms (e.g., wheelchair or bedside chair)?: A Little Help needed to walk in hospital room?: A Little Help needed climbing 3-5 steps with a railing? : A Lot 6 Click Score: 17    End of Session Equipment Utilized During Treatment: Gait belt Activity Tolerance: Other (comment) (limited by SOB) Patient left: with call bell/phone within reach;in bed;with bed alarm set Nurse Communication: Mobility status PT Visit Diagnosis: Other abnormalities of gait and mobility (R26.89)     Time: 1610-9604 PT Time Calculation (min) (ACUTE ONLY): 15 min  Charges:  $Gait Training: 8-22 mins                      Paulino Door, DPT Acute Rehabilitation Services Pager: [phone] Office: [phone]   Maida Sale E 05/01/2020, 3:58 PM

## 2020-05-01 NOTE — Progress Notes (Signed)
   Subjective: 1 Day Post-Op Procedure(s) (LRB): TOTAL KNEE ARTHROPLASTY (Right) Patient reports pain as mild.   Patient seen in rounds by Dr. Lequita Halt. Patient is well, and has had no acute complaints or problems other than pain in the right knee. Denies chest pain, SOB or calf pain. No issues overnight. Foley catheter removed this AM. We will continue therapy today.   Objective: Vital signs in last 24 hours: Temp:  [97.6 F (36.4 C)-98.2 F (36.8 C)] 98 F (36.7 C) (11/09 0645) Pulse Rate:  [76-90] 82 (11/09 0830) Resp:  [9-18] 16 (11/09 0645) BP: (105-121)/(66-81) 116/77 (11/09 0645) SpO2:  [96 %-100 %] 99 % (11/09 0830)  Intake/Output from previous day:  Intake/Output Summary (Last 24 hours) at 05/01/2020 0914 Last data filed at 05/01/2020 4680 Gross per 24 hour  Intake 3939.4 ml  Output 4225 ml  Net -285.6 ml     Intake/Output this shift: Total I/O In: 200 [P.O.:200] Out: -   Labs: Recent Labs    05/01/20 0312  HGB 9.9*   Recent Labs    05/01/20 0312  WBC 8.8  RBC 3.31*  HCT 31.9*  PLT 230   Recent Labs    05/01/20 0312  NA 136  K 3.4*  CL 100  CO2 25  BUN 18  CREATININE 0.83  GLUCOSE 109*  CALCIUM 9.1   No results for input(s): LABPT, INR in the last 72 hours.  Exam: General - Patient is Alert and Oriented Extremity - Neurologically intact Neurovascular intact Sensation intact distally Dorsiflexion/Plantar flexion intact Dressing - dressing C/D/I Motor Function - intact, moving foot and toes well on exam.   Past Medical History:  Diagnosis Date  . Anemia   . Anxiety   . Depression   . Dyspnea    Sometimes at rest  . G6PD deficiency   . ILD (interstitial lung disease) (HCC)   . Lupus (HCC)   . Pneumonia   . Rheumatoid arthritis (HCC)   . Sleep apnea     Assessment/Plan: 1 Day Post-Op Procedure(s) (LRB): TOTAL KNEE ARTHROPLASTY (Right) Principal Problem:   OA (osteoarthritis) of knee Active Problems:   Primary  osteoarthritis of right knee  Estimated body mass index is 39.05 kg/m as calculated from the following:   Height as of this encounter: 5\' 11"  (1.803 m).   Weight as of this encounter: 127 kg. Advance diet Up with therapy D/C IV fluids   Patient's anticipated LOS is less than 2 midnights, meeting these requirements: - Younger than 73 - Lives within 1 hour of care - Has a competent adult at home to recover with post-op recover - NO history of  - Diabetes  - Coronary Artery Disease  - Heart failure  - Heart attack  - Stroke  - DVT/VTE  - Cardiac arrhythmia  - Respiratory Failure/COPD  - Renal failure  - Anemia  - Advanced Liver disease  DVT Prophylaxis - Xarelto Weight bearing as tolerated. Continue therapy.  Potassium 3.3 this AM, 2 doses of 40 mEq KCl ordered.  Plan is to go Home after hospital stay. Plan for discharge later today once meeting goals with therapy Scheduled for OPPT at [facility] Follow-up in the office in 2 weeks  Pain management is handling postoperative pain medications. No narcotic scripts will be sent at discharge.  78, PA-C Orthopedic Surgery [phone] 05/01/2020, 9:14 AM

## 2020-05-01 NOTE — Progress Notes (Signed)
Patient did not pas  PT, K Edmisten PA notified [employer]

## 2020-05-02 DIAGNOSIS — Z886 Allergy status to analgesic agent status: Secondary | ICD-10-CM | POA: Diagnosis not present

## 2020-05-02 DIAGNOSIS — M1711 Unilateral primary osteoarthritis, right knee: Secondary | ICD-10-CM | POA: Diagnosis not present

## 2020-05-02 DIAGNOSIS — Z79899 Other long term (current) drug therapy: Secondary | ICD-10-CM | POA: Diagnosis not present

## 2020-05-02 DIAGNOSIS — Z7951 Long term (current) use of inhaled steroids: Secondary | ICD-10-CM | POA: Diagnosis not present

## 2020-05-02 DIAGNOSIS — Z880 Allergy status to penicillin: Secondary | ICD-10-CM | POA: Diagnosis not present

## 2020-05-02 DIAGNOSIS — M25761 Osteophyte, right knee: Secondary | ICD-10-CM | POA: Diagnosis not present

## 2020-05-02 DIAGNOSIS — Z882 Allergy status to sulfonamides status: Secondary | ICD-10-CM | POA: Diagnosis not present

## 2020-05-02 DIAGNOSIS — Z87891 Personal history of nicotine dependence: Secondary | ICD-10-CM | POA: Diagnosis not present

## 2020-05-02 LAB — CBC
HCT: 31.9 % — ABNORMAL LOW (ref 36.0–46.0)
Hemoglobin: 10.2 g/dL — ABNORMAL LOW (ref 12.0–15.0)
MCH: 30.4 pg (ref 26.0–34.0)
MCHC: 32 g/dL (ref 30.0–36.0)
MCV: 94.9 fL (ref 80.0–100.0)
Platelets: 225 10*3/uL (ref 150–400)
RBC: 3.36 MIL/uL — ABNORMAL LOW (ref 3.87–5.11)
RDW: 12.7 % (ref 11.5–15.5)
WBC: 9.3 10*3/uL (ref 4.0–10.5)
nRBC: 0 % (ref 0.0–0.2)

## 2020-05-02 LAB — BASIC METABOLIC PANEL
Anion gap: 11 (ref 5–15)
BUN: 13 mg/dL (ref 6–20)
CO2: 24 mmol/L (ref 22–32)
Calcium: 8.8 mg/dL — ABNORMAL LOW (ref 8.9–10.3)
Chloride: 99 mmol/L (ref 98–111)
Creatinine, Ser: 0.74 mg/dL (ref 0.44–1.00)
GFR, Estimated: >60 mL/min (ref >60)
Glucose, Bld: 106 mg/dL — ABNORMAL HIGH (ref 70–99)
Potassium: 4 mmol/L (ref 3.5–5.1)
Sodium: 134 mmol/L — ABNORMAL LOW (ref 135–145)

## 2020-05-02 MED ORDER — MYCOPHENOLATE MOFETIL 500 MG PO TABS
1000.0000 mg | ORAL_TABLET | Freq: Two times a day (BID) | ORAL | Status: DC
Start: 2020-05-02 — End: 2023-03-31

## 2020-05-02 NOTE — Progress Notes (Signed)
Orthopedic Tech Progress Note Patient Details:  RENEE ERB 1970-07-08 [id number] Discharged picked up cpm and overhead frame. Patient ID: Shannon [name], female   DOB: [birth year], 49 y.o.   MRN: [id number]   [name] 05/02/2020, 4:02 PM

## 2020-05-02 NOTE — Progress Notes (Signed)
Physical Therapy Treatment Patient Details Name: Shannon [name] MRN: [id number] DOB: 07/21/1970 Today's Date: 05/02/2020    History of Present Illness pt 49 yo s/p RTKA 04/30/2020 with hx of lupus, and s/p LTKA 03/2019    PT Comments    Pt ambulated short distance to stairs and practiced safe technique.  Pt required seated rest break and then ambulated back into room.  Pt reports her husband can assist her at home, and she feels ready for d/c today.  Pt provided with HEP handout and had no further questions.   Follow Up Recommendations  Follow surgeon's recommendation for DC plan and follow-up therapies     Equipment Recommendations  None recommended by PT    Recommendations for Other Services       Precautions / Restrictions Precautions Precautions: Knee;Fall Required Braces or Orthoses: Knee Immobilizer - Right Knee Immobilizer - Right: Discontinue once straight leg raise with < 10 degree lag Restrictions Weight Bearing Restrictions: No    Mobility  Bed Mobility Overal bed mobility: Needs Assistance Bed Mobility: Supine to Sit     Supine to sit: Min guard     General bed mobility comments: pt self assisted R LE  Transfers Overall transfer level: Needs assistance Equipment used: Rolling walker (2 wheeled) Transfers: Sit to/from Stand Sit to Stand: Min guard         General transfer comment: verbal cues for UE and LE positioning  Ambulation/Gait Ambulation/Gait assistance: Min guard Gait Distance (Feet): 12 Feet (x2) Assistive device: Rolling walker (2 wheeled) Gait Pattern/deviations: Step-to pattern;Antalgic;Decreased stance time - right     General Gait Details: verbal cues for sequence, RW positioning, step lengthl; pt took seated rest break after stairs prior to ambulating back into room; 12'x2   Stairs Stairs: Yes Stairs assistance: Min guard Stair Management: Forwards;Step to pattern;One rail Right Number of Stairs: 2 General stair comments:  verbal cues for safety and sequence, pt fatigued quickly and requested to sit down (recliner ready); instructed pt to have a sturdy chair ready next to stairs to enter home in case she needs to sit down   Wheelchair Mobility    Modified Rankin (Stroke Patients Only)       Balance                                            Cognition Arousal/Alertness: Awake/alert Behavior During Therapy: WFL for tasks assessed/performed Overall Cognitive Status: Within Functional Limits for tasks assessed                                        Exercises      General Comments        Pertinent Vitals/Pain Pain Assessment: 0-10 Pain Score: 6  Pain Location: right knee Pain Descriptors / Indicators: Aching;Burning;Sore Pain Intervention(s): Repositioned;Monitored during session;Premedicated before session    Home Living                      Prior Function            PT Goals (current goals can now be found in the care plan section) Progress towards PT goals: Progressing toward goals    Frequency    7X/week      PT Plan Current plan remains appropriate  Co-evaluation              AM-PAC PT "6 Clicks" Mobility   Outcome Measure  Help needed turning from your back to your side while in a flat bed without using bedrails?: A Little Help needed moving from lying on your back to sitting on the side of a flat bed without using bedrails?: A Little Help needed moving to and from a bed to a chair (including a wheelchair)?: A Little Help needed standing up from a chair using your arms (e.g., wheelchair or bedside chair)?: A Little Help needed to walk in hospital room?: A Little Help needed climbing 3-5 steps with a railing? : A Little 6 Click Score: 18    End of Session Equipment Utilized During Treatment: Gait belt Activity Tolerance: Patient limited by fatigue Patient left: with call bell/phone within reach;in chair Nurse  Communication: Mobility status PT Visit Diagnosis: Other abnormalities of gait and mobility (R26.89)     Time: 9390-3009 PT Time Calculation (min) (ACUTE ONLY): 18 min  Charges:  $Gait Training: 8-22 mins                    Paulino Door, DPT Acute Rehabilitation Services Pager: [phone] Office: [phone]  Maida Sale E 05/02/2020, 1:24 PM

## 2020-05-02 NOTE — Progress Notes (Signed)
   Subjective: 2 Days Post-Op Procedure(s) (LRB): TOTAL KNEE ARTHROPLASTY (Right) Patient reports pain as mild.   Patient seen in rounds by Dr. Lequita Halt. Patient is well, and has had no acute complaints or problems. Patient states she is ready to go home today. Voiding without difficulty. No issues overnight. Denies chest pain, SOB, calf pain. Patient did not pass physical therapy evaluation yesterday 05/01/20, prompting additional night stay and will be further evaluated today by PT.  Plan is to go Home after hospital stay.  Objective: Vital signs in last 24 hours: Temp:  [97.5 F (36.4 C)-98.9 F (37.2 C)] 97.8 F (36.6 C) (11/10 0413) Pulse Rate:  [82-94] 94 (11/10 0413) Resp:  [14-20] 16 (11/10 0413) BP: (121-146)/(86-93) 142/92 (11/10 0413) SpO2:  [77 %-100 %] 100 % (11/10 0413)  Intake/Output from previous day:  Intake/Output Summary (Last 24 hours) at 05/02/2020 0749 Last data filed at 05/02/2020 0411 Gross per 24 hour  Intake 800 ml  Output 1150 ml  Net -350 ml    Intake/Output this shift: No intake/output data recorded.  Labs: Recent Labs    05/01/20 0312 05/02/20 0645  HGB 9.9* 10.2*   Recent Labs    05/01/20 0312 05/02/20 0645  WBC 8.8 9.3  RBC 3.31* 3.36*  HCT 31.9* 31.9*  PLT 230 225   Recent Labs    05/01/20 0312 05/02/20 0645  NA 136 134*  K 3.4* 4.0  CL 100 99  CO2 25 24  BUN 18 13  CREATININE 0.83 0.74  GLUCOSE 109* 106*  CALCIUM 9.1 8.8*   No results for input(s): LABPT, INR in the last 72 hours.  Exam: General - Patient is Alert and Oriented Extremity - Neurologically intact Neurovascular intact Sensation intact distally Dorsiflexion/Plantar flexion intact No cellulitis present Dressing/Incision - clean, dry, no drainage. ACE wrap and gauze removed.  Motor Function - intact, moving foot and toes well on exam.  Past Medical History:  Diagnosis Date  . Anemia   . Anxiety   . Depression   . Dyspnea    Sometimes at rest  .  G6PD deficiency   . ILD (interstitial lung disease) (HCC)   . Lupus (HCC)   . Pneumonia   . Rheumatoid arthritis (HCC)   . Sleep apnea     Assessment/Plan: 2 Days Post-Op Procedure(s) (LRB): TOTAL KNEE ARTHROPLASTY (Right) Principal Problem:   OA (osteoarthritis) of knee Active Problems:   Primary osteoarthritis of right knee  Estimated body mass index is 39.05 kg/m as calculated from the following:   Height as of this encounter: 5\' 11"  (1.803 m).   Weight as of this encounter: 127 kg. Up with therapy  DVT Prophylaxis - Xarelto Weight-bearing as tolerated  Plan for discharge around lunchtime today, once she has been cleared by PT and completed stair training. Outpatient physical therapy has already been scheduled.   , PA-C Orthopedic Surgery [phone] 05/02/2020, 7:49 AM

## 2020-05-03 DIAGNOSIS — M25561 Pain in right knee: Secondary | ICD-10-CM | POA: Diagnosis not present

## 2020-05-03 DIAGNOSIS — M25661 Stiffness of right knee, not elsewhere classified: Secondary | ICD-10-CM | POA: Insufficient documentation

## 2020-05-07 ENCOUNTER — Encounter: Payer: Medicare HMO | Admitting: Primary Care

## 2020-05-07 DIAGNOSIS — M25661 Stiffness of right knee, not elsewhere classified: Secondary | ICD-10-CM | POA: Diagnosis not present

## 2020-05-07 DIAGNOSIS — M25561 Pain in right knee: Secondary | ICD-10-CM | POA: Diagnosis not present

## 2020-05-07 NOTE — Progress Notes (Signed)
@Patient  ID: , female    DOB: [birth year], 49 y.o.   MRN: [id number]  No chief complaint on file.   Referring provider: [id number]  HPI: 49 year old female, smoker quit in 2016 (7-pack-year history).  Medical history significant for OSA, ILD, cough variant asthma, osteoarthritis, positive rheumatoid factor, lupus, obesity.  Patient follows with Dr. 2017 for interstitial lung disease; recently established with Dr. Marchelle Gearing on 01/18/20 for sleep consult.  05/07/2020 Patient presents today for a follow-up for sleep apnea/new CPAP start.  On 02/02/2020 showed mild obstructive sleep apnea.  Recommended she start on auto CPAP 5 to 15 cm H2O.  There was some delay getting machine CPAP machine from [facility], order was sent to [facility] on 04/11/20.       Allergies  Allergen Reactions  . Asa [Aspirin] Hives and Shortness Of Breath    Chest tightness   . Penicillins Hives    Has patient had a PCN reaction causing immediate rash, facial/tongue/throat swelling, SOB or lightheadedness with hypotension: Unknown Has patient had a PCN reaction causing severe rash involving mucus membranes or skin necrosis: Unknown Has patient had a PCN reaction that required hospitalization: Unknown Has patient had a PCN reaction occurring within the last 10 years: No If all of the above answers are "NO", then may proceed with Cephalosporin use.  Tolerated Cephalosporin Date: 05/01/20.    . Sulfa Antibiotics     G6PD deficiency     Immunization History  Administered Date(s) Administered  . Influenza Split 03/23/2014  . Influenza,inj,Quad PF,6+ Mos 03/15/2016, 06/08/2017, 03/08/2018, 02/17/2019  . PFIZER SARS-COV-2 Vaccination 10/06/2019, 11/05/2019  . Pneumococcal-Unspecified 08/23/2014    Past Medical History:  Diagnosis Date  . Anemia   . Anxiety   . Depression   . Dyspnea    Sometimes at rest  . G6PD deficiency   . ILD (interstitial lung disease) (HCC)   . Lupus (HCC)     . Pneumonia   . Rheumatoid arthritis (HCC)   . Sleep apnea     Tobacco History: Social History   Tobacco Use  Smoking Status Former Smoker  . Packs/day: 0.50  . Years: 14.00  . Pack years: 7.00  . Types: Cigarettes  . Quit date: 09/25/2014  . Years since quitting: 5.6  Smokeless Tobacco Never Used   Counseling given: Not Answered   Outpatient Medications Prior to Visit  Medication Sig Dispense Refill  . albuterol (VENTOLIN HFA) 108 (90 Base) MCG/ACT inhaler Inhale 2 puffs into the lungs every 6 (six) hours as needed for wheezing or shortness of breath. (Patient taking differently: Inhale 1 puff into the lungs every 4 (four) hours as needed for wheezing or shortness of breath. ) 1 Inhaler 5  . buPROPion (WELLBUTRIN XL) 150 MG 24 hr tablet Take 150 mg by mouth daily.   1  . busPIRone (BUSPAR) 10 MG tablet Take 10 mg by mouth daily.    . famotidine (PEPCID) 20 MG tablet Take 20 mg by mouth at bedtime.    . ferrous sulfate 325 (65 FE) MG tablet Take 650 mg by mouth daily with breakfast.    . fluticasone furoate-vilanterol (BREO ELLIPTA) 100-25 MCG/INH AEPB Inhale 1 puff into the lungs daily. 60 each 2  . gabapentin (NEURONTIN) 300 MG capsule Take a 300 mg capsule three times a day for two weeks following surgery.Then take a 300 mg capsule two times a day for two weeks. Then take a 300 mg capsule once a day for two weeks.  Then discontinue. 84 capsule 0  . hydroxychloroquine (PLAQUENIL) 200 MG tablet Take 400 mg by mouth daily.     Marland Kitchen lisinopril (ZESTRIL) 10 MG tablet Take 10 mg by mouth daily.    . methocarbamol (ROBAXIN) 500 MG tablet Take 1 tablet (500 mg total) by mouth every 6 (six) hours as needed for muscle spasms. 40 tablet 0  . mycophenolate (CELLCEPT) 500 MG tablet Take 2 tablets (1,000 mg total) by mouth 2 (two) times daily.    . Oxycodone HCl 10 MG TABS Take 10 mg by mouth 2 (two) times daily as needed (pain).     . QUEtiapine (SEROQUEL) 200 MG tablet Take 200 mg by mouth at  bedtime.     . rivaroxaban (XARELTO) 10 MG TABS tablet Take 1 tablet (10 mg total) by mouth daily with breakfast for 20 days. 20 tablet 0  . sertraline (ZOLOFT) 100 MG tablet Take 150 mg by mouth at bedtime.   0   No facility-administered medications prior to visit.      Review of Systems  Review of Systems   Physical Exam  LMP 04/28/2015  Physical Exam   Lab Results:  CBC    Component Value Date/Time   WBC 9.3 05/02/2020 0645   RBC 3.36 (L) 05/02/2020 0645   HGB 10.2 (L) 05/02/2020 0645   HCT 31.9 (L) 05/02/2020 0645   PLT 225 05/02/2020 0645   MCV 94.9 05/02/2020 0645   MCH 30.4 05/02/2020 0645   MCHC 32.0 05/02/2020 0645   RDW 12.7 05/02/2020 0645   LYMPHSABS 1.3 05/13/2019 1243   MONOABS 0.3 05/13/2019 1243   EOSABS 0.1 05/13/2019 1243   BASOSABS 0.0 05/13/2019 1243    BMET    Component Value Date/Time   NA 134 (L) 05/02/2020 0645   K 4.0 05/02/2020 0645   CL 99 05/02/2020 0645   CO2 24 05/02/2020 0645   GLUCOSE 106 (H) 05/02/2020 0645   BUN 13 05/02/2020 0645   CREATININE 0.74 05/02/2020 0645   CREATININE 0.78 07/14/2018 1024   CALCIUM 8.8 (L) 05/02/2020 0645   GFRNONAA >60 05/02/2020 0645   GFRNONAA 99 02/26/2018 1053   GFRAA >60 02/08/2020 1130   GFRAA 115 02/26/2018 1053    BNP No results found for: BNP  ProBNP No results found for: PROBNP  Imaging: No results found.   Assessment & Plan:   No problem-specific Assessment & Plan notes found for this encounter.     Glenford Bayley, NP 05/07/2020

## 2020-05-07 NOTE — Patient Instructions (Signed)
Recommendations Continue to wear CPAP every 4-6 hours or more each night Do not drive experiencing excessive daytime fatigue or somnolence Do not take sedating medication or drink alcohol in excess prior to bedtime as these can worsen sleep apnea Continue to work on weight loss efforts  Follow-up: 6 months with Dr. Wynona Neat for OSA   CPAP and BPAP Information CPAP and BPAP are methods of helping a person breathe with the use of air pressure. CPAP stands for "continuous positive airway pressure." BPAP stands for "bi-level positive airway pressure." In both methods, air is blown through your nose or mouth and into your air passages to help you breathe well. CPAP and BPAP use different amounts of pressure to blow air. With CPAP, the amount of pressure stays the same while you breathe in and out. With BPAP, the amount of pressure is increased when you breathe in (inhale) so that you can take larger breaths. Your health care provider will recommend whether CPAP or BPAP would be more helpful for you. Why are CPAP and BPAP treatments used? CPAP or BPAP can be helpful if you have:  Sleep apnea.  Chronic obstructive pulmonary disease (COPD).  Heart failure.  Medical conditions that weaken the muscles of the chest including muscular dystrophy, or neurological diseases such as amyotrophic lateral sclerosis (ALS).  Other problems that cause breathing to be weak, abnormal, or difficult. CPAP is most commonly used for obstructive sleep apnea (OSA) to keep the airways from collapsing when the muscles relax during sleep. How is CPAP or BPAP administered? Both CPAP and BPAP are provided by a small machine with a flexible plastic tube that attaches to a plastic mask. You wear the mask. Air is blown through the mask into your nose or mouth. The amount of pressure that is used to blow the air can be adjusted on the machine. Your health care provider will determine the pressure setting that should be used based  on your individual needs. When should CPAP or BPAP be used? In most cases, the mask only needs to be worn during sleep. Generally, the mask needs to be worn throughout the night and during any daytime naps. People with certain medical conditions may also need to wear the mask at other times when they are awake. Follow instructions from your health care provider about when to use the machine. What are some tips for using the mask?   Because the mask needs to be snug, some people feel trapped or closed-in (claustrophobic) when first using the mask. If you feel this way, you may need to get used to the mask. One way to do this is by holding the mask loosely over your nose or mouth and then gradually applying the mask more snugly. You can also gradually increase the amount of time that you use the mask.  Masks are available in various types and sizes. Some fit over your mouth and nose while others fit over just your nose. If your mask does not fit well, talk with your health care provider about getting a different one.  If you are using a mask that fits over your nose and you tend to breathe through your mouth, a chin strap may be applied to help keep your mouth closed.  The CPAP and BPAP machines have alarms that may sound if the mask comes off or develops a leak.  If you have trouble with the mask, it is very important that you talk with your health care provider about finding a  way to make the mask easier to tolerate. Do not stop using the mask. Stopping the use of the mask could have a negative impact on your health. What are some tips for using the machine?  Place your CPAP or BPAP machine on a secure table or stand near an electrical outlet.  Know where the on/off switch is located on the machine.  Follow instructions from your health care provider about how to set the pressure on your machine and when you should use it.  Do not eat or drink while the CPAP or BPAP machine is on. Food or  fluids could get pushed into your lungs by the pressure of the CPAP or BPAP.  Do not smoke. Tobacco smoke residue can damage the machine.  For home use, CPAP and BPAP machines can be rented or purchased through home health care companies. Many different brands of machines are available. Renting a machine before purchasing may help you find out which particular machine works well for you.  Keep the CPAP or BPAP machine and attachments clean. Ask your health care provider for specific instructions. Get help right away if:  You have redness or open areas around your nose or mouth where the mask fits.  You have trouble using the CPAP or BPAP machine.  You cannot tolerate wearing the CPAP or BPAP mask.  You have pain, discomfort, and bloating in your abdomen. Summary  CPAP and BPAP are methods of helping a person breathe with the use of air pressure.  Both CPAP and BPAP are provided by a small machine with a flexible plastic tube that attaches to a plastic mask.  If you have trouble with the mask, it is very important that you talk with your health care provider about finding a way to make the mask easier to tolerate. This information is not intended to replace advice given to you by your health care provider. Make sure you discuss any questions you have with your health care provider. Document Revised: 09/29/2018 Document Reviewed: 04/28/2016 Elsevier Patient Education  2020 [employer].

## 2020-05-09 DIAGNOSIS — M25661 Stiffness of right knee, not elsewhere classified: Secondary | ICD-10-CM | POA: Diagnosis not present

## 2020-05-11 DIAGNOSIS — M1711 Unilateral primary osteoarthritis, right knee: Secondary | ICD-10-CM | POA: Diagnosis not present

## 2020-05-11 DIAGNOSIS — M25561 Pain in right knee: Secondary | ICD-10-CM | POA: Diagnosis not present

## 2020-05-11 DIAGNOSIS — G894 Chronic pain syndrome: Secondary | ICD-10-CM | POA: Diagnosis not present

## 2020-05-11 DIAGNOSIS — E669 Obesity, unspecified: Secondary | ICD-10-CM | POA: Diagnosis not present

## 2020-05-11 DIAGNOSIS — Z96652 Presence of left artificial knee joint: Secondary | ICD-10-CM | POA: Diagnosis not present

## 2020-05-11 DIAGNOSIS — Z96651 Presence of right artificial knee joint: Secondary | ICD-10-CM | POA: Diagnosis not present

## 2020-05-11 DIAGNOSIS — Z79891 Long term (current) use of opiate analgesic: Secondary | ICD-10-CM | POA: Diagnosis not present

## 2020-05-14 DIAGNOSIS — M25661 Stiffness of right knee, not elsewhere classified: Secondary | ICD-10-CM | POA: Diagnosis not present

## 2020-05-21 DIAGNOSIS — M25661 Stiffness of right knee, not elsewhere classified: Secondary | ICD-10-CM | POA: Diagnosis not present

## 2020-05-21 NOTE — Telephone Encounter (Signed)
MR pt is running low on the cellcept and is asking for a refill.  I did not see where you have refilled this before.  Please advise. Thanks

## 2020-05-22 NOTE — Telephone Encounter (Signed)
While I am okay filling her mycophenolate I believe in her case she gets it from her rheumatologist at [facility].  Therefore it is probably best that she gets it from them.  Please double check with her

## 2020-05-23 DIAGNOSIS — R69 Illness, unspecified: Secondary | ICD-10-CM | POA: Diagnosis not present

## 2020-05-23 DIAGNOSIS — M25661 Stiffness of right knee, not elsewhere classified: Secondary | ICD-10-CM | POA: Diagnosis not present

## 2020-05-23 DIAGNOSIS — F411 Generalized anxiety disorder: Secondary | ICD-10-CM | POA: Diagnosis not present

## 2020-05-25 DIAGNOSIS — M25661 Stiffness of right knee, not elsewhere classified: Secondary | ICD-10-CM | POA: Diagnosis not present

## 2020-05-26 DIAGNOSIS — G4733 Obstructive sleep apnea (adult) (pediatric): Secondary | ICD-10-CM | POA: Diagnosis not present

## 2020-05-26 DIAGNOSIS — G4710 Hypersomnia, unspecified: Secondary | ICD-10-CM | POA: Diagnosis not present

## 2020-05-28 DIAGNOSIS — M25561 Pain in right knee: Secondary | ICD-10-CM | POA: Diagnosis not present

## 2020-05-30 DIAGNOSIS — M75101 Unspecified rotator cuff tear or rupture of right shoulder, not specified as traumatic: Secondary | ICD-10-CM | POA: Diagnosis not present

## 2020-05-30 DIAGNOSIS — Z23 Encounter for immunization: Secondary | ICD-10-CM | POA: Diagnosis not present

## 2020-05-30 DIAGNOSIS — M3213 Lung involvement in systemic lupus erythematosus: Secondary | ICD-10-CM | POA: Diagnosis not present

## 2020-05-30 DIAGNOSIS — M159 Polyosteoarthritis, unspecified: Secondary | ICD-10-CM | POA: Diagnosis not present

## 2020-05-30 DIAGNOSIS — J849 Interstitial pulmonary disease, unspecified: Secondary | ICD-10-CM | POA: Diagnosis not present

## 2020-05-31 ENCOUNTER — Ambulatory Visit: Payer: Medicare HMO | Admitting: Pulmonary Disease

## 2020-06-04 DIAGNOSIS — M25561 Pain in right knee: Secondary | ICD-10-CM | POA: Diagnosis not present

## 2020-06-05 DIAGNOSIS — Z96651 Presence of right artificial knee joint: Secondary | ICD-10-CM | POA: Diagnosis not present

## 2020-06-08 DIAGNOSIS — M1711 Unilateral primary osteoarthritis, right knee: Secondary | ICD-10-CM | POA: Diagnosis not present

## 2020-06-08 DIAGNOSIS — M25561 Pain in right knee: Secondary | ICD-10-CM | POA: Diagnosis not present

## 2020-06-08 DIAGNOSIS — G894 Chronic pain syndrome: Secondary | ICD-10-CM | POA: Diagnosis not present

## 2020-06-08 DIAGNOSIS — E669 Obesity, unspecified: Secondary | ICD-10-CM | POA: Diagnosis not present

## 2020-06-08 DIAGNOSIS — Z96652 Presence of left artificial knee joint: Secondary | ICD-10-CM | POA: Diagnosis not present

## 2020-06-08 DIAGNOSIS — Z79891 Long term (current) use of opiate analgesic: Secondary | ICD-10-CM | POA: Diagnosis not present

## 2020-06-08 DIAGNOSIS — Z96651 Presence of right artificial knee joint: Secondary | ICD-10-CM | POA: Diagnosis not present

## 2020-06-13 DIAGNOSIS — M25661 Stiffness of right knee, not elsewhere classified: Secondary | ICD-10-CM | POA: Diagnosis not present

## 2020-06-13 DIAGNOSIS — M25561 Pain in right knee: Secondary | ICD-10-CM | POA: Diagnosis not present

## 2020-06-14 DIAGNOSIS — J019 Acute sinusitis, unspecified: Secondary | ICD-10-CM | POA: Diagnosis not present

## 2020-06-21 DIAGNOSIS — R0981 Nasal congestion: Secondary | ICD-10-CM | POA: Diagnosis not present

## 2020-06-21 DIAGNOSIS — M25661 Stiffness of right knee, not elsewhere classified: Secondary | ICD-10-CM | POA: Diagnosis not present

## 2020-06-26 DIAGNOSIS — G4733 Obstructive sleep apnea (adult) (pediatric): Secondary | ICD-10-CM | POA: Diagnosis not present

## 2020-06-26 DIAGNOSIS — G4710 Hypersomnia, unspecified: Secondary | ICD-10-CM | POA: Diagnosis not present

## 2020-06-28 DIAGNOSIS — M25661 Stiffness of right knee, not elsewhere classified: Secondary | ICD-10-CM | POA: Diagnosis not present

## 2020-07-02 DIAGNOSIS — Z1152 Encounter for screening for COVID-19: Secondary | ICD-10-CM | POA: Diagnosis not present

## 2020-07-11 DIAGNOSIS — Z96651 Presence of right artificial knee joint: Secondary | ICD-10-CM | POA: Diagnosis not present

## 2020-07-11 DIAGNOSIS — E669 Obesity, unspecified: Secondary | ICD-10-CM | POA: Diagnosis not present

## 2020-07-11 DIAGNOSIS — Z79891 Long term (current) use of opiate analgesic: Secondary | ICD-10-CM | POA: Diagnosis not present

## 2020-07-11 DIAGNOSIS — M1711 Unilateral primary osteoarthritis, right knee: Secondary | ICD-10-CM | POA: Diagnosis not present

## 2020-07-11 DIAGNOSIS — Z96652 Presence of left artificial knee joint: Secondary | ICD-10-CM | POA: Diagnosis not present

## 2020-07-11 DIAGNOSIS — M25561 Pain in right knee: Secondary | ICD-10-CM | POA: Diagnosis not present

## 2020-07-11 DIAGNOSIS — G894 Chronic pain syndrome: Secondary | ICD-10-CM | POA: Diagnosis not present

## 2020-07-19 ENCOUNTER — Encounter: Payer: Self-pay | Admitting: Pulmonary Disease

## 2020-07-19 ENCOUNTER — Ambulatory Visit (INDEPENDENT_AMBULATORY_CARE_PROVIDER_SITE_OTHER): Payer: Medicare HMO | Admitting: Pulmonary Disease

## 2020-07-19 ENCOUNTER — Other Ambulatory Visit: Payer: Self-pay

## 2020-07-19 VITALS — BP 130/80 | HR 110 | Temp 97.6°F | Ht 71.0 in | Wt 286.1 lb

## 2020-07-19 DIAGNOSIS — Z6841 Body Mass Index (BMI) 40.0 and over, adult: Secondary | ICD-10-CM | POA: Diagnosis not present

## 2020-07-19 DIAGNOSIS — G4733 Obstructive sleep apnea (adult) (pediatric): Secondary | ICD-10-CM | POA: Diagnosis not present

## 2020-07-19 DIAGNOSIS — M7070 Other bursitis of hip, unspecified hip: Secondary | ICD-10-CM | POA: Diagnosis not present

## 2020-07-19 NOTE — Patient Instructions (Signed)
Obstructive sleep apnea -Good compliance with CPAP  We will contact medical supply company for mask supplies Changing your mask may help with the mask leaks  Follow-up in 4 to 5 months

## 2020-07-19 NOTE — Progress Notes (Signed)
Shannon [name]    [id number]    [birth year]  Primary Care Physician:[name], Pauline Good  Referring Physician: Eunice Blase, PA-C [address] [place],  [place] [address]  Chief complaint:   Patient with a history of snoring, nonrestorative sleep Multiple awakenings  HPI:  Tolerating CPAP well Occasionally does take the mask off at night  She does report some leaks  Not coughing as much Tolerates CPAP well  History of interstitial lung disease, lupus, follows up with Dr. 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 albuterol (VENTOLIN HFA) 108 (90 Base) MCG/ACT inhaler Inhale 2 puffs into the lungs every 6 (six) hours as needed for wheezing or shortness of breath. (Patient taking differently: Inhale 1 puff into the lungs every 4 (four) hours as needed for wheezing or shortness of breath.)  . buPROPion (WELLBUTRIN XL) 150 MG 24 hr tablet Take 150 mg by mouth daily.   . busPIRone (BUSPAR) 10 MG tablet Take 10 mg by mouth daily.  . famotidine (PEPCID) 20 MG tablet Take 20 mg by mouth at bedtime.  . ferrous sulfate 325 (65 FE) MG tablet Take 650 mg by mouth daily with breakfast.  . fluticasone furoate-vilanterol (BREO ELLIPTA) 100-25 MCG/INH AEPB Inhale 1 puff into the lungs daily.  . hydroxychloroquine  (PLAQUENIL) 200 MG tablet Take 400 mg by mouth daily.   Marland Kitchen lisinopril (ZESTRIL) 10 MG tablet Take 10 mg by mouth daily.  . methocarbamol (ROBAXIN) 500 MG tablet Take 1 tablet (500 mg total) by mouth every 6 (six) hours as needed for muscle spasms.  . mycophenolate (CELLCEPT) 500 MG tablet Take 2 tablets (1,000 mg total) by mouth 2 (two) times daily.  . Oxycodone HCl 10 MG TABS Take 10 mg by mouth 2 (two) times daily as needed (pain).   . QUEtiapine (SEROQUEL) 200 MG tablet Take 200 mg by mouth at bedtime.   . sertraline (ZOLOFT) 100 MG tablet Take 150 mg by mouth at bedtime.   . [DISCONTINUED] gabapentin (NEURONTIN) 300 MG capsule Take a 300 mg capsule three times a day for two weeks following surgery.Then take a 300 mg capsule two times a day for two weeks. Then take a 300 mg capsule once a day for two weeks. Then discontinue.  . [DISCONTINUED] rivaroxaban (XARELTO) 10 MG TABS tablet Take 1 tablet (10 mg total) by mouth daily with breakfast for 20 days.   No facility-administered encounter medications on file as of 07/19/2020.    Allergies as of 07/19/2020 - Review Complete 07/19/2020  Allergen Reaction Noted  . Asa [aspirin] Hives and Shortness Of Breath   . Penicillins Hives 12/18/2015  . Sulfa antibiotics      Past Medical History:  Diagnosis Date  . Anemia   . Anxiety   .  Depression   . Dyspnea    Sometimes at rest  . G6PD deficiency   . ILD (interstitial lung disease) (HCC)   . Lupus (HCC)   . Pneumonia   . Rheumatoid arthritis (HCC)   . Sleep apnea     Past Surgical History:  Procedure Laterality Date  . ABDOMINAL HYSTERECTOMY    . APPENDECTOMY  1980  . cardiac catherization  08/20/2017  . CARDIAC CATHETERIZATION N/A 05/10/2015   Procedure: Right Heart Cath;  Surgeon: Laurey Morale, MD;  Location: [facility] INVASIVE CV LAB;  Service: Cardiovascular;  Laterality: N/A;  . CESAREAN SECTION  '95, '02, '07   X 3  . CHOLECYSTECTOMY  2010  . LAPAROSCOPIC HYSTERECTOMY  10/2015    have ovaries  . RIGHT HEART CATH N/A 08/20/2017   Procedure: RIGHT HEART CATH;  Surgeon: Laurey Morale, MD;  Location: [facility] INVASIVE CV LAB;  Service: Cardiovascular;  Laterality: N/A;  . TOTAL KNEE ARTHROPLASTY Left 04/04/2019   Procedure: TOTAL KNEE ARTHROPLASTY;  Surgeon: Ollen Gross, MD;  Location: [facility] ORS;  Service: Orthopedics;  Laterality: Left;   . TOTAL KNEE ARTHROPLASTY Right 04/30/2020   Procedure: TOTAL KNEE ARTHROPLASTY;  Surgeon: Ollen Gross, MD;  Location: [facility] ORS;  Service: Orthopedics;  Laterality: Right;   . TUBAL LIGATION      Family History  Problem Relation Age of Onset  . Sarcoidosis Mother   . Lupus Sister   . Healthy Daughter   . Healthy Son   . Healthy Son     Social History   Socioeconomic History  . Marital status: Married    Spouse name: [name]  . Number of children: 3  . Years of education: 32  . Highest education level: Not on file  Occupational History  . 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 Smoking status: Former Smoker    Packs/day: 0.50    Years: 14.00    Pack years: 7.00    Types: Cigarettes    Quit date: 09/25/2014    Years since quitting: 5.8  . Smokeless tobacco: Never Used  Vaping Use  . Vaping Use: Never used  Substance and Sexual Activity  . Alcohol use: Yes    Alcohol/week: 0.0 standard drinks    Comment: occas.  . Drug use: No  . Sexual activity: Not on file    Comment: Hysterectomy  Other Topics Concern  . Not on file  Social History Narrative   Married, lives with husband, children   Caffeine- coffee  1 daily   Social Determinants of Health   Financial Resource Strain: Not on file  Food Insecurity: Not on file  Transportation Needs: Not on file  Physical Activity: Not on file  Stress: Not on file  Social Connections: Not on file  Intimate Partner Violence: Not on file    Review of Systems  Constitutional: Positive for fatigue.   Respiratory: Positive for apnea and cough.   Psychiatric/Behavioral: Positive for sleep disturbance.    Vitals:   07/19/20 1149  BP: 130/80  Pulse: (!) 110  Temp: 97.6 F (36.4 C)  SpO2: 98%     Physical Exam Constitutional:      Appearance: She is obese.  HENT:     Head: Normocephalic and atraumatic.     Mouth/Throat:     Mouth: Mucous membranes are moist.     Comments: Mallampati 4, crowded oropharynx Cardiovascular:  Rate and Rhythm: Normal rate and regular rhythm.     Pulses: Normal pulses.     Heart sounds: Normal heart sounds. No murmur heard. No friction rub.  Pulmonary:     Effort: Pulmonary effort is normal. No respiratory distress.     Breath sounds: No stridor. No wheezing, rhonchi or rales.  Musculoskeletal:     Cervical back: No rigidity or tenderness.  Neurological:     Mental Status: She is alert.  Psychiatric:        Mood and Affect: Mood normal.    Results of the Epworth flowsheet 01/18/2020  Sitting and reading 1  Watching TV 2  Sitting, inactive in a public place (e.g. a theatre or a meeting) 0  As a passenger in a car for an hour without a break 1  Lying down to rest in the afternoon when circumstances permit 1  Sitting and talking to someone 0  Sitting quietly after a lunch without alcohol 2  In a car, while stopped for a few minutes in traffic 0  Total score 7   Data Reviewed: CT from 12/22/2019 reviewed  Download from her machine does reveal good compliance at 77% Residual AHI of 4 Does have occasional mask leaks  Assessment:   Mild obstructive sleep apnea  Nonrestorative sleep, daytime sleepiness  Pathophysiology of sleep disordered breathing reviewed with the patient Treatment options for sleep disordered breathing discussed with the patient  Plan/Recommendations: Continue CPAP  We will contact DME company for CPAP supplies, mask change may be beneficial  Weight loss efforts encouraged  Tentative follow-up in 4 to 5  months   Virl Diamond MD [facility] 07/19/2020, 11:56 AM  CC: [name], [name], PA-C

## 2020-07-23 ENCOUNTER — Other Ambulatory Visit (HOSPITAL_COMMUNITY): Payer: Medicare HMO

## 2020-07-23 DIAGNOSIS — R69 Illness, unspecified: Secondary | ICD-10-CM | POA: Diagnosis not present

## 2020-07-23 DIAGNOSIS — F411 Generalized anxiety disorder: Secondary | ICD-10-CM | POA: Diagnosis not present

## 2020-07-24 ENCOUNTER — Other Ambulatory Visit (HOSPITAL_COMMUNITY)
Admission: RE | Admit: 2020-07-24 | Discharge: 2020-07-24 | Disposition: A | Discharge status: Home / Self Care | Payer: Medicare HMO | Source: Ambulatory Visit | Attending: Internal Medicine | Admitting: Internal Medicine

## 2020-07-24 DIAGNOSIS — Z20822 Contact with and (suspected) exposure to covid-19: Secondary | ICD-10-CM | POA: Diagnosis not present

## 2020-07-24 DIAGNOSIS — Z01812 Encounter for preprocedural laboratory examination: Secondary | ICD-10-CM | POA: Insufficient documentation

## 2020-07-24 LAB — SARS CORONAVIRUS 2 (TAT 6-24 HRS): SARS Coronavirus 2: NEGATIVE

## 2020-07-27 ENCOUNTER — Ambulatory Visit (INDEPENDENT_AMBULATORY_CARE_PROVIDER_SITE_OTHER): Payer: Medicare HMO | Admitting: Internal Medicine

## 2020-07-27 ENCOUNTER — Other Ambulatory Visit: Payer: Self-pay

## 2020-07-27 ENCOUNTER — Encounter: Payer: Self-pay | Admitting: Internal Medicine

## 2020-07-27 VITALS — BP 118/86 | HR 51 | Temp 97.6°F | Ht 69.0 in | Wt 286.0 lb

## 2020-07-27 DIAGNOSIS — G4733 Obstructive sleep apnea (adult) (pediatric): Secondary | ICD-10-CM | POA: Diagnosis not present

## 2020-07-27 DIAGNOSIS — J8489 Other specified interstitial pulmonary diseases: Secondary | ICD-10-CM

## 2020-07-27 DIAGNOSIS — Z79899 Other long term (current) drug therapy: Secondary | ICD-10-CM | POA: Diagnosis not present

## 2020-07-27 DIAGNOSIS — M359 Systemic involvement of connective tissue, unspecified: Secondary | ICD-10-CM

## 2020-07-27 DIAGNOSIS — Z7185 Encounter for immunization safety counseling: Secondary | ICD-10-CM | POA: Diagnosis not present

## 2020-07-27 DIAGNOSIS — D849 Immunodeficiency, unspecified: Secondary | ICD-10-CM | POA: Diagnosis not present

## 2020-07-27 DIAGNOSIS — G4710 Hypersomnia, unspecified: Secondary | ICD-10-CM | POA: Diagnosis not present

## 2020-07-27 LAB — PULMONARY FUNCTION TEST
DL/VA % pred: 117 %
DL/VA: 4.91 ml/min/mmHg/L
DLCO cor % pred: 76 %
DLCO cor: 19.54 ml/min/mmHg
DLCO unc % pred: 76 %
DLCO unc: 19.54 ml/min/mmHg
FEF 25-75 Pre: 3.21 L/sec
FEF2575-%Pred-Pre: 108 %
FEV1-%Pred-Pre: 78 %
FEV1-Pre: 2.28 L
FEV1FVC-%Pred-Pre: 107 %
FEV6-%Pred-Pre: 73 %
FEV6-Pre: 2.59 L
FEV6FVC-%Pred-Pre: 102 %
FVC-%Pred-Pre: 72 %
FVC-Pre: 2.59 L
Pre FEV1/FVC ratio: 88 %
Pre FEV6/FVC Ratio: 100 %

## 2020-07-27 NOTE — Progress Notes (Signed)
Spirometry/DLCOl performed today.

## 2020-07-27 NOTE — Progress Notes (Signed)
PCP [name],[name], PA-C Referred by Dr Bo Merino  HPI   IOV 08/23/2014 [name]    Pt referred by Dr. Estanislado Pandy for ILD. Pt c/o SOB with activity and rest, dry cough and chest tightness also with and without activity.    50 year old female referred for evaluation of autoimmune interstitial lung disease. She presents with her husband. In 2010 while living in [place] she reports she was diagnosed with lupus associated with rheumatoid arthritis in her joints. In 2012 she moved to live in [facility] and several months after that started noticing insidious onset of shortness of breath. Local rheumatologist diagnosed her with interstitial lung disease. She was referred to Dr. Earnest Conroy at [facility] in [facility] and was started on CellCept/prednisone for autoimmune interstitial lung disease he and however, sometime around 2 years ago she ran out of medical insurance and stopped taking these medications. During this time her dyspnea has progressed. It is currently rated as moderate to severe. It is present on exertion and relieved by rest. Even minimal amount of exertion makes her very ddyspneic. Now she has insurance and she did see Dr. Rosann Auerbach locally and she has autoimmune panel lab ordered. In addition exam revealed crackles and there for she's been referred here for reevaluation of interstitial lung disease and dyspnea. Dyspnea is associated with some chest tightness but no chest pain. This no associated dizzine   11/24/2014 Follow UP ILD  [name]  She has autoimmune ILD with RA/Lupus  She was seen 6 weeks ago, restarted on Cellcept.  Previously on cellcept but lost her insurance until recently.  On low dose prednisone 5mg  daily .  Last CT chest 4/4 showed ILD changes similar to 2013. Echo was ok with EFG 55-60%, nml PAP . In  March .   Did not take dapsone ,due to  GP6D deficiency.  Labs ok last week with nml LFT , no sign change in hbg. /wbc.  She is feeling better. Does feel her breathing is some better.  No flare of cough or wheezing.     OV 01/16/2015  Chief Complaint  Patient presents with  . Follow-up    Pt c/o DOE, mild dry cough, and chest tightness when SOB. Pt states the chest tightness has improved).    follow-up interstitial lung disease in the setting of rheumatoid arthritis Follow-up high risk medication use - on CellCept and prednisone since mid April 2016  - Presents with her husband. Both give a history. Overall she is doing better in terms of dyspnea after starting CellCept and prednisone. However the improvement this only moderate. She still left with a residual moderate dyspnea on exertion that is also made worse with bending or heart air and improved with rest and cool air. It is associated with some cough and wheezing. She takes albuterol inhaler which she feels helps only somewhat but not fully and not quickly enough. She is frustrated by this. In addition she's complaining of some associated right lower back paraspinal spasm for which massage gives her relief. He has never attended pulmonary rehabilitation.  - Lab review shows she has had problems with potassium and has had potassium supplementation. Last lab check was 12/08/2014. She is due for lab test right now   OV 03/28/2015   Chief Complaint  Patient presents with  .  Follow-up    3 month follow up. Pt states that she is still having some problems with her breathing. Pt c/o of feeling chest tightness, chest pain and cough that is dry. Pt denies wheeze. Pt states that she did trial the Advair and does feel that it helped some.   Follow-up interstitial lung disease secondary to autoimmune process and associated dyspnea that seems out of proportion\  She presents with her husband. She continues on CellCept and prednisone. In the  last visit approximately 2-3 months ago she had out of proportion dyspnea. I gave her some Advair to try. She says this only helped a little bit. Overall she says that dyspnea still persists. It is worse than in the spring 2016 when she started CellCept and prednisone. It is stable since July 2016. It is moderate in intensity. Occurs randomly at rest but also with exertion. Occasionally relieved by rest but also happens at rest. Heat and humidity make it worse. Advair helped a little bit only. This no associated chest tightness or wheezing. She did try and enroll  in pulmonary rehabilitation but could not afford the the startup program and is waiting to hear from them for the maintenance program. She is frustrated by all the symptoms.  Pulmonary function test October 15,016 today FVC 2.55 L/64%, total lung capacity 4 L/65% and diffusion 14.5/42%. Overall no change since April 2016   OV 07/04/2015  Chief Complaint  Patient presents with  . Follow-up    pt. states breathing is baseline. SOB. dry cough. wheezing. occ. chest pain/tightness. feels her airway is blocked.   Follow-up interstitial lung disease admitted autoimmune processes and associated out of proportion dyspnea  She presents again with her husband. She continues on CellCept 2 g twice daily associated with prednisone. She cannot take Bactrim or dapsone due to G6PD deficiency. Last seen in the fall of 2016. She was having out of proportion dyspnea. Rated cardia pulmonary stress test and she could not tolerate this test. She then underwent right heart catheterization mid-November 2016 with Dr. Marca Ancona. Review the tests this is normal. Overall stable but she and husband still continued to complain about this resting dyspnea associated with wheezing. They hear noises in the chest. Sometime she gasps for air even at rest. She feels it comes from the chest but the husband points to the throat. I offered second opinion at [facility]  because of this unusual symptoms but they declined citing distance. She was supposed to attend pulmonary rehabilitation but they cannot afford a $60 co-pay twice a week 8 weeks. Offered ENT evaluation that agreeable but they wanted done in  which is closer logistically. In addition patient is contemplating now applying for disability. 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  Walking desaturation test 185 feet 3 laps and rheumatic: No desaturation    OV 10/12/2015  Chief Complaint  Patient presents with  . Follow-up    PFT today.  breathing is better.  Pt is currently on STD, [employer] approved STD until today, wants to know today if pt is able to return to work, any restrictions.  Pt states that she has been more stable while out of work.      Follow-up interstitial lung disease due to autoimmune processes not otherwise specified  Last seen January 2017. Since then she has been on short-term disability and out of work. She does heavy manual work. The lack of work estimated dyspnea better. She did pulmonary function test today  that I personally visualized and overall it is same. There are no new issues. She is on CellCept and she is tolerating this fine. Last visit she had some anemia we follow this up and the anemia was better. Also hypokalemia resolved by February 2017. She is due for blood work today. 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 This showed up at last visit. She saw an ENT in [place] within referred her to [facility]. She does not want to go to [facility]. She ended up seeing Dr. Ezzard Standing locally. But now she is going to see Dr.  Dina Rich [facility]   Pulmonary function test today 10/12/2015 shows postbronchodilator FVC 2.65 L/67%. FEV1 2.34 L/72% which is up 17% positive bronchodilator response. Ratio of 80/106%. Total lung capacity of 3.8/62%. DLCO  18.36/52%. Overall consistent with restriction and lung parenchymal disease. Overall PFTs are stable compared to April 2016 but perhaps in DLCO slightly better   OV 04/15/2016  Chief Complaint  Patient presents with  . Interstitial Lung Disease    Breathing is unchanged since last OV. Reports SOB, coughing. Cough is non productive. Denies chest tightness or wheezing.   Follow-up interstitial lung disease due to autoimmune processes not otherwise specified. On CellCept and prednisone. Not on Bactrim prophylaxis due to G6PD deficiency  Last visit April 2017. At that time based on pulmonary function tests showed stability and ILD for a year. 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 She did see local ENT doctor [place] and apparently has been reassured. At this point in time she is applying for long-term disability at work but the work discharged from services and she is now applying for Social Security disability. Her dyspnea stable since the interim. She's also had hysterectomy but for the last few weeks her cough is worse and it is dry. This no fever. Is no weight loss or chills. She says she's compliant with her CellCept and prednisone.  OV 05/29/2016  Chief Complaint  Patient presents with  . Follow-up    Pt states she still has harsh dry cough, pt states her SOB is unchanged and chest tightness when SOB or when coughing a lot. Pt deneis f/c/s.     Follow-up interstitial lung disease due to autoimmune processes not otherwise specified. On CellCept and prednisone since April 2016. Not on Bactrim prophylaxis due to G6PD deficiency  Sundai returns for follow-up. She had high resolution CT chest that shows stability and interstitial lung disease since April 2016. She Pulmicort function test that shows mild improvement since April 2016. Therefore it appears that her CellCept and prednisone is helping her she'll lung disease related to collagen-vascular disease.  However she tells me that she continues to have chronic cough for the last few to several months. It is progressive. It is dry. It is worse than her baseline. The dyspnea is unchanged. This no fever or weight loss or chills   OV 07/24/2016  Chief Complaint  Patient presents with  . Follow-up    Pt states she feels the Arnuity has been helping her breathing. Pt c/o dry cough and occ chest tightness.   rec    ICD-9-CM ICD-10-CM   1. ILD (interstitial lung disease) (HCC) 515 J84.9   2. Chronic cough 786.2 R05   3. High risk medication use V58.69 Z79.899    Clinically improved with cellcept/prednisone and arnuity  Blood work ok dec 2017  pLAN - start pulm rehab at [facility] - continue  aruity daily - continue cellcept and prednisone  Followup - 6 months do Pre-bd spiro and dlco only. No lung volume or bd response. No post-bd spiro - 6 months fu Dr Marchelle Gearing after above or sooner if needed     12/15/2016  f/u ov/Wert re: cough dry on arnuity  Chief Complaint  Patient presents with  . Follow-up    Breathing is unchanged since her last visit. Pt states she is here to f/u on recent ABG result. She c/o feeling tired all of the time. She has occ cough- non prod.       Finished at Rehab   beginning  Of May  2018 and trouble walking fast or uphill Waking up with ha's since rehab sev days a week  Cough is new x one month mostly dry and day > noct   No obvious day to day or daytime variability or assoc excess/ purulent sputum or mucus plugs or hemoptysis or cp or chest tightness, subjective wheeze or overt sinus or hb symptoms. No unusual exp hx or h/o childhood pna/ asthma or knowledge of premature birth.   OV 02/13/2017  Chief Complaint  Patient presents with  . Follow-up    cough/ILD follow up - prod cough with brownish/green mucus with tightness and chest pain x2 days.  denies any f/c/s, hemoptysis.  PFT done today.    Follow-up interstitial lung disease due to autoimmune  processes not otherwise specified. On CellCept and prednisone since April 2016. Not on Bactrim prophylaxis due to G6PD deficiency  50 year old female immunosuppressed with CellCept and prednisone. Not seen her in many months. Currently taking prednisone 3 mg per day and CellCept 1000 mg twice daily. She cannot do Bactrim prophylaxis because of G6PD deficiency. She tells me that overall her health has been stable but in the last few weeks noticing increased shortness of breath in the last few days this increased cough and change in color of sputum to green and increased chest tightness and a feeling that she is getting acute bronchitis. She also was contemplated going to the emergency room a few days ago but now she is better. There is no obvious fever or chills or hemoptysis or edema paroxysmal nocturnal dyspnea or orthopnea. Pulmonary function test today shows 10% decline in FVC and DLCO compared to baseline.   OV 06/08/2017  Chief Complaint  Patient presents with  . Follow-up    Feeling about the same as last visit. Still having chest tightness and wheezing at times, Sounds hoarse.     Follow-up interstitial lung disease due to autoimmune processes not otherwise specified. On CellCept and prednisone since April 2016. Not on Bactrim prophylaxis due to G6PD deficiency. Normal Right heart cath Nov 2016.  Last high-resolution CT November 2017  Last visit August 2018.  There is a routine follow-up.  Overall she feels stable.  FVC shows stability since August 2018 but declined since 1 year ago.  DLCO shows continued decline.  Though her lung health is stable.  She says she lost her 1 only biological sister last week due to lupus.  The sister was only 41 and lived in [place].  There are no new issues. Walking desaturation test on 06/08/2017 185 feet x 3 laps on ROOM AIR:  did not desaturate. Rest pulse ox was 100%%, final pulse ox was 100%. HR response 81/min at rest to 109/min at peak exertion.  Patient  Shannon [name]  Did not Desaturate < 88% . Shannon [name] did nto  Desaturated </= 3% points. CHIA MOWERS yes did get tachyardic   OV 10/15/2017   Follow-up interstitial lung disease due to autoimmune processes not otherwise specified. On CellCept and prednisone since April 2016. Not on Bactrim prophylaxis due to G6PD deficiency. Normal Right heart cath Nov 2016 and feb 2019.  Last high-resolution CT November 2017 and dec 2018 without progresion   Last visit December 2018.  This is a routine follow-up.  In the interim she had high-resolution CT scan of the chest that did not show any progression in interstitial lung disease between 2017 and 2018.Marland Kitchen  Therefore we did an echocardiogram that showed slight elevation in pulmonary artery systolic pressure.  Therefore we sent it to her repeat right heart catheterization and this was normal as documented below.  Overall she feels stable compared to one year ago but has declined compared to 2 years ago.  She is also complaining about a new recurrence of cough that happens mostly at night despite Symbicort and prednisone and CellCept.  It wakes her up.  It is moderate in intensity.  There is no associated wheezing or edema orthopnea.  It happens randomly.  There is associated heartburn.  She is on nothing for acid reflux.       Right Heart Pressures 08/20/17 [facility] Procedural Findings: Hemodynamics (mmHg) RA mean 3 RV 30/6 PA 23/8, mean 14 PCWP mean 8  Oxygen saturations: PA 72% AO 98%  Cardiac Output (Fick) 6.73  Cardiac Index (Fick) 3.06  Cardiac Output (Thermo) 6.94 Cardiac Index (Thermo) 3.15    OV 12/01/2017  Chief Complaint  Patient presents with  . Follow-up    Pt has SOB with exertion, wheezing, some chest tightness. Pt was dry cough more at bedtime.      Follow-up interstitial lung disease due to autoimmune processes not otherwise specified. On CellCept and prednisone since April 2016. Not on Bactrim prophylaxis due to  G6PD deficiency. Normal Right heart cath Nov 2016 and feb 2019.  Last high-resolution CT November 2017 and dec 2018 without progresion   This visit is to see if she is got progressive interstitial lung disease.  Last visit she was complaining of more cough.  Therefore we added some acid reflux treatment.  She says after the acid reflux treatment symptoms have actually improved.  This contradicts what she told the CMA at intake.  Overall she is feeling stable.  However she does have a new episode of orthostatic dizziness going on at a mild level for the last 1 week.  Today after doing pulmonary function test she did feel dizzy.  Therefore we checked orthostatics on her and she got tachycardic standing up and her blood pressure did drop although still within normal limits.  She does not have any chest pain.  Lung function test shows continued stability in the last 1 year including compared to the most recent attempt.  OV 03/08/2018  Subjective:  Patient ID: Shannon [name], female , DOB: 07-26-1970 , age 60 y.o. , MRN: [id number] , ADDRESS: [address]  [place] [facility] [address]   03/08/2018 -   Chief Complaint  Patient presents with  . Follow-up    Spirometry performed today. Pt states she is still having some mild problems with dizziness but not as bad as last visit. Pt also states she has been having problems with headaches x1 month. Pt states her breathing is about the same  as last visit and also states she still has the dry cough.    Follow-up interstitial lung disease due to autoimmune processes not otherwise specified. On CellCept and prednisone since April 2016. Not on Bactrim prophylaxis due to G6PD deficiency. Normal Right heart cath Nov 2016 and feb 2019.  Last high-resolution CT November 2017 and dec 2018 without progresion     HPI Shannon [name] 50 y.o. - connective tissue disease ILD. Follow-up. Last seen June 2019 by she started having new onset dizziness. It seemed orthostatic. She  says since then it is spontaneously improved but still persists. She is also dealing with fatigue issues. In 02/26/2018 she felt she had a lupus flare saw Dr. Algis Downs and given a prednisone burst. Autoimmune profile at that time showed continued positive ANA titer but normal complements. She had a CBC that showed a drop in hemoglobin by 1 g percent. Her baseline is around 11.5 g percent and currently it is around 10.5 g percent. She says after the prednisone burst a lupus flare symptoms and fatigue have improved although it still persists. If it is actually worse in the last few months. She had pulmonary function test that shows continued improvement especially following the recent prednisone burst. Walking desaturation test was normal other than tachycardia. We observed her to be walking very slowly in a very deconditioned and fatigued fashion. 02/26/2089 and liver function and renal function reviewed and this was normal. Medication review shows she is on oxycodone and gabapentin for back pain. She says she's been on gabapentin for the last 1 year. It appears that the temporal sequence of fatigue and dizziness might be related to gabapentin but she is not so sure.       Results for TAIJAH, COWINS (MRN [id number]) as of 03/08/2018 10:51  Ref. Range 02/26/2018 10:53  Hemoglobin Latest Ref Range: 11.7 - 15.5 g/dL 02.3 (L)  Results for LILLYIAN, SUTER (MRN [id number]) as of 03/08/2018 10:51  Ref. Range 02/26/2018 10:53  Creatinine Latest Ref Range: 0.50 - 1.10 mg/dL 8.37  Results for NOTASHA, ALERS (MRN [id number]) as of 03/08/2018 10:51  Ref. Range 02/26/2018 10:53  AST Latest Ref Range: 10 - 35 U/L 19  ALT Latest Ref Range: 6 - 29 U/L 18   Results for BRIENNA, BIESECKER (MRN [id number]) as of 03/08/2018 10:51  Ref. Range 02/26/2018 10:53  Anti Nuclear Antibody(ANA) Latest Ref Range: NEGATIVE  POSITIVE (A)  ANA Pattern 1 Unknown SPECKLED (A)  ANA Titer 1 Latest Units: titer > OR = 1:1280 (A)  ds DNA Ab Latest Units:  IU/mL <1  C3 Complement Latest Ref Range: 83 - 193 mg/dL 122  C4 Complement Latest Ref Range: 15 - 57 mg/dL 33    ROS - per HPI    OV 06/03/2018  Subjective:  Patient ID: Shannon [name], female , DOB: [birth year] , age 61 y.o. , MRN: [id number] , ADDRESS: [address]  [place] [facility] [address]   06/03/2018 -   Chief Complaint  Patient presents with  . Follow-up    pt states breathing is baseline. c/o sob with exertion, non prod cough & wheezing    Follow-up interstitial lung disease due to autoimmune processes not otherwise specified. On CellCept and prednisone since April 2016. Not on Bactrim prophylaxis due to G6PD deficiency. Normal Right heart cath Nov 2016 and feb 2019.  Last high-resolution CT November 2017 and dec 2018 without progresion   HPI Shannon [name] 50 y.o. -presents for follow-up.  Last visit September 2019.  At that time CBC showed anemia.  We repeated the hemoglobin 1 month later and was stable around 10 g%.  She tells me that overall she is stable.  Last visit she had dizziness and I told her to stop gabapentin which she did and her dizziness and fatigue have improved.  She feels she is stable but in the last 3 days she has had a dry cough but no fever or congestion or wheezing or hemoptysis or edema.  No worsening shortness of breath or chest tightness.  Other than that she feels good.  She did up walking desaturation test today and it appears similar to previous visit.       OV 05/13/2019  Subjective:  Patient ID: Shannon [name], female , DOB: 1970/07/22 , age 46 y.o. , MRN: [id number] , ADDRESS: [address] [place] [address]   05/13/2019 -   Chief Complaint  Patient presents with  . Follow-up    Pt just had total left knee replacement mid October 2020. Pt said she still has problems with SOB especially with going upstairs. Pt also has occ wheezing but denies any real complaints of cough.   Follow-up interstitial lung disease due to autoimmune  processes not otherwise specified. On CellCept and prednisone since April 2016. Not on Bactrim prophylaxis due to G6PD deficiency. Normal Right heart cath Nov 2016 and feb 2019.  Last high-resolution CT November 2017 and dec 2018 without progression. Last PFT Sept 2019  HPI Shannon [name] 50 y.o. -returns for follow-up.  Last seen in September 2019 and because of the pandemic things got delayed.  She was supposed to see me in 6 months with spirometry and high-resolution CT chest for progression but this did not happen.  At this follow-up we do not have those data points as yet.  She tells me the interim she has had knee surgery and therefore she is not able to do a simple walk test with [place].  She has gained some weight.  Also in the interim she is now on Social Security disability and she is unemployed now.  She feels her shortness of breath is slightly worse particularly when walking up stairs but otherwise overall she feels stable.  I reviewed her blood work.  And it seems anemia is worse in the 9 g%.  She really denies any bleeding.  Normally she runs between 10 and 11 g%.  I noticed that she still not had a shingles vaccine.   OV 01/09/2020   Subjective:  Patient ID: Shannon [name], female , DOB: 1971-01-04, age 51 y.o. years. , MRN: [id number],  ADDRESS: [address] [place] [address] PCP  Eunice Blase, PA-C Providers : Treatment Team:  Attending Provider: Kalman Shan, MD    Follow-up interstitial lung disease due to autoimmune processes not otherwise specified. On CellCept and prednisone since April 2016. Not on Bactrim prophylaxis due to G6PD deficiency. Normal Right heart cath Nov 2016 and feb 2019.  Last high-resolution CT November 2017 and dec 2018 and dec 2020 without progression. Last PFT Sept 2019 and now June 2021  Chief Complaint  Patient presents with  . Follow-up       HPI Shannon [name] 50 y.o. -returns for follow-up.  Since her last visit she continues  to be stable.  She is only on CellCept.  She is also on Plaquenil.  She is not on prednisone or antifibrotic's.  Her dyspnea is  stable.  Her pulmonary function test is improved and a CT chest shows stability.  The main issue is that she says over the last 3 years she has gained 30 to 40 pounds of weight.  She says that she is snoring a lot according to her husband.  Apparently the husband's also noticed apneic spells.  She says she has had the Covid vaccine but is not documented in our chart.  She is yet to have the Shingrix vaccine.       Results for LEIAH, GIANNOTTI (MRN [id number]) as of 02/13/2017 12:16  Ref. Range 10/09/2014 09:39 03/28/2015 11:40 10/12/2015 13:42 05/06/2016 10:38 02/13/2017 10:49 06/08/2017  12/01/2017  03/08/2018  11/24/19  FVC-Pre Latest Units: L 2.54 2.55 2.53 2.63 2.32 2.31 2.38 2.46 2.67  FVC-%Pred-Pre Latest Units: % 64 64 64 67 59 59% 61% 67% 73%   Results for KHADEJAH, SON (MRN [id number]) as of 02/13/2017 12:16  Ref. Range 10/09/2014 09:39 03/28/2015 11:40 10/12/2015 13:42 05/06/2016 10:38 02/13/2017 10:49 06/08/2017  12/01/2017  03/08/2018  11/24/19  DLCO unc Latest Units: ml/min/mmHg 15.03 14.50 18.36 18.31 16.35 14.21 16.38 x 18.56  DLCO unc % pred Latest Units: % 43 42 53 53 47 41 47% x 72%    ROS - per HPI Results for ARRIYANNA, MERSCH (MRN [id number]) as of 01/09/2020 16:33  Ref. Range 12/21/2019 22:12  Creatinine Latest Ref Range: 0.44 - 1.00 mg/dL 0.86  Results for MARSELA, KUAN (MRN [id number]) as of 01/09/2020 16:33  Ref. Range 12/21/2019 22:12  Hemoglobin Latest Ref Range: 12.0 - 15.0 g/dL 52.8 (L)   IMPRESSION: dec 2020 compared to 2018 1. The appearance of the lungs remains compatible with interstitial lung disease, with a spectrum of findings considered indeterminate for usual interstitial pneumonia (UIP) per current ats guidelines. However, given the spectrum of findings and the stability of the findings compared to the prior study, this is once again  strongly favored to represent nonspecific interstitial pneumonia (NSIP).   Electronically Signed   By: Trudie Reed M.D.   On: 06/15/2019 09:42 ROS - per HPI    OV 07/27/2020  Subjective:  Patient ID: Shannon [name], female , DOB: 1971/03/26 , age 72 y.o. , MRN: [id number] , ADDRESS: [address] [place] [address] PCP Eunice Blase, PA-C Patient Care Team: Otila Back as PCP - General (Internal Medicine)  This Provider for this visit: Treatment Team:  Attending Provider: Kalman Shan, MD    07/27/2020 -   Chief Complaint  Patient presents with  . Follow-up    Get pft results.   Follow-up interstitial lung disease due to autoimmune processes not otherwise specified. -Lupus per [facility] clinic notes in December 2021.   - On CellCept and prednisone since April 2016.  Off prednisone in 2021  Not on Bactrim prophylaxis due to G6PD deficiency.    - Normal Right heart cath Nov 2016 and feb 2019  -  Last high-resolution CT November 2017 and dec 2018 and dec 2020 without progression.    - Last PFT Sept 2019 and now June 2021 and feb 2022  Chief Complaint  Patient presents with  . Follow-up      HPI Shannon [name] 50 y.o. -returns for routine ILD follow-up.  Last seen in July 2021.  Clinically she is stable.  Since last seeing me November 2020 when she had right knee surgery.  She uses a cane.  She is also seen Dr. Charlann Boxer in sleep clinic.  She is now on CPAP and it is helping her.  Her weight gain persists.  Her mobility has therefore gone down but from a dyspnea standpoint she is stable.  She is up-to-date with her COVID vaccine.  With the booster she did get symptomatic with Sirs response.  She continues on CellCept.  She is now seeing [facility] rheumatology.  She does not see [facility] anymore.  She believes she has lupus.  She has an appointment upcoming in a few days.  Review of the records at [facility] indicate t that she has  lupus.  She continues to be off prednisone.  She is unable to do a walk test today because of her knee issues and also she is on a cane.  She has pulmonary function test there is a 2-3.8% decline in FVC/DLCO.  This could easily be because of weight gain.  Her symptom scores are stable.  She did have CT angiogram in the summer 2021 this ruled out pulmonary embolism.  There is no comment about ILD.  But then it is a contrast CT.      SYMPTOM SCALE - ILD 05/13/2019  07/27/2020   O2 use RA ra  Shortness of Breath 0 -> 5 scale with 5 being worst (score 6 If unable to do)   At rest 2 1  Simple tasks - showers, clothes change, eating, shaving 3 2  Household (dishes, doing bed, laundry) 3 3  Shopping 3 4  Walking level at own pace 2 3  Walking up Stairs 5 5  Total (40 - 48) Dyspnea Score 29 18  How bad is your cough? 0 2  How bad is your fatigue 3 0  nausea  0  vomit  0  diarrhea  0  anxiety  4  depression  4          Simple office walk 185 feet x  3 laps goal with forehead probe 03/08/2018  06/03/2018   O2 used Room air Room iar  Number laps completed 3 3 x 250 feet  Comments about pace Slow pace normal  Resting Pulse Ox/HR 100% and 96/min 100% and 76  Final Pulse Ox/HR 100% and 112/min 96% and 121  Desaturated </= 88% n no  Desaturated <= 3% points no Yes, 4 points  Got Tachycardic >/= 90/min yes yes  Symptoms at end of test Mild fatigue Mild dyspnea  Miscellaneous comments Very slow pace        PFT  PFT Results Latest Ref Rng & Units 07/27/2020 11/24/2019 03/08/2018 12/01/2017 06/08/2017 02/13/2017 05/06/2016  FVC-Pre L 2.59 2.67 2.46 2.38 2.31 2.32 2.63  FVC-Predicted Pre % 72 73 67 61 59 59 67  FVC-Post L - - - - - - -  FVC-Predicted Post % - - - - - - -  Pre FEV1/FVC % % 88 83 84 83 81 84 80  Post FEV1/FCV % % - - - - - - -  FEV1-Pre L 2.28 2.21 2.07 1.98 1.87 1.94 2.10  FEV1-Predicted Pre % 78 75 70 62 59 61 65  FEV1-Post L - - - - - - -  DLCO uncorrected  ml/min/mmHg 19.54 18.56 - 16.38 14.21 16.35 18.31  DLCO UNC% % 76 72 - 47 41 47 53  DLCO corrected ml/min/mmHg 19.54 20.32 - - 14.89 15.02 19.41  DLCO COR %Predicted % 76 79 - - 43 43 56  DLVA Predicted % 117 122 - 78 78 80 94  TLC L - - - - - - -  TLC % Predicted % - - - - - - -  RV % Predicted % - - - - - - -       has a past medical history of Anemia, Anxiety, Depression, Dyspnea, G6PD deficiency, ILD (interstitial lung disease) (HCC), Lupus (HCC), Pneumonia, Rheumatoid arthritis (HCC), and Sleep apnea.   reports that she quit smoking about 5 years ago. Her smoking use included cigarettes. She has a 7.00 pack-year smoking history. She has never used smokeless tobacco.  Past Surgical History:  Procedure Laterality Date  . ABDOMINAL HYSTERECTOMY    . APPENDECTOMY  1980  . cardiac catherization  08/20/2017  . CARDIAC CATHETERIZATION N/A 05/10/2015   Procedure: Right Heart Cath;  Surgeon: Laurey Morale, MD;  Location: [facility] INVASIVE CV LAB;  Service: Cardiovascular;  Laterality: N/A;  . CESAREAN SECTION  '95, '02, '07   X 3  . CHOLECYSTECTOMY  2010  . LAPAROSCOPIC HYSTERECTOMY  10/2015   have ovaries  . RIGHT HEART CATH N/A 08/20/2017   Procedure: RIGHT HEART CATH;  Surgeon: Laurey Morale, MD;  Location: [facility] INVASIVE CV LAB;  Service: Cardiovascular;  Laterality: N/A;  . TOTAL KNEE ARTHROPLASTY Left 04/04/2019   Procedure: TOTAL KNEE ARTHROPLASTY;  Surgeon: Ollen Gross, MD;  Location: [facility] ORS;  Service: Orthopedics;  Laterality: Left;   . TOTAL KNEE ARTHROPLASTY Right 04/30/2020   Procedure: TOTAL KNEE ARTHROPLASTY;  Surgeon: Ollen Gross, MD;  Location: [facility] ORS;  Service: Orthopedics;  Laterality: Right;   . TUBAL LIGATION      Allergies  Allergen Reactions  . Asa [Aspirin] Hives and Shortness Of Breath    Chest tightness   . Penicillins Hives    Has patient had a PCN reaction causing immediate rash, facial/tongue/throat swelling, SOB or lightheadedness with  hypotension: Unknown Has patient had a PCN reaction causing severe rash involving mucus membranes or skin necrosis: Unknown Has patient had a PCN reaction that required hospitalization: Unknown Has patient had a PCN reaction occurring within the last 10 years: No If all of the above answers are "NO", then may proceed with Cephalosporin use.  Tolerated Cephalosporin Date: 05/01/20.    . Sulfa Antibiotics     G6PD deficiency     Immunization History  Administered Date(s) Administered  . Influenza Split 03/23/2014  . Influenza,inj,Quad PF,6+ Mos 03/15/2016, 06/08/2017, 03/08/2018, 02/17/2019  . Influenza-Unspecified 04/03/2020  . PFIZER(Purple Top)SARS-COV-2 Vaccination 09/13/2019, 10/06/2019, 11/05/2019, 05/30/2020  . Pneumococcal-Unspecified 08/23/2014    Family History  Problem Relation Age of Onset  . Sarcoidosis Mother   . Lupus Sister   . Healthy Daughter   . Healthy Son   . Healthy Son      Current Outpatient Medications:  .  albuterol (VENTOLIN HFA) 108 (90 Base) MCG/ACT inhaler, Inhale 2 puffs into the lungs every 6 (six) hours as needed for wheezing or shortness of breath. (Patient taking differently: Inhale 1 puff into the lungs every 4 (four) hours as needed for wheezing or shortness of breath.), Disp: 1 Inhaler, Rfl: 5 .  buPROPion (WELLBUTRIN XL) 150 MG 24 hr tablet, Take 150 mg by mouth daily. , Disp: , Rfl: 1 .  busPIRone (BUSPAR) 10 MG tablet, Take 10 mg by mouth daily., Disp: , Rfl:  .  famotidine (PEPCID) 20 MG tablet, Take 20 mg by mouth at bedtime., Disp: , Rfl:  .  ferrous sulfate 325 (65 FE) MG tablet, Take 650 mg by mouth daily with breakfast., Disp: , Rfl:  .  fluticasone furoate-vilanterol (  BREO ELLIPTA) 100-25 MCG/INH AEPB, Inhale 1 puff into the lungs daily., Disp: 60 each, Rfl: 2 .  hydroxychloroquine (PLAQUENIL) 200 MG tablet, Take 400 mg by mouth daily. , Disp: , Rfl:  .  lisinopril (ZESTRIL) 10 MG tablet, Take 10 mg by mouth daily., Disp: , Rfl:  .   mycophenolate (CELLCEPT) 500 MG tablet, Take 2 tablets (1,000 mg total) by mouth 2 (two) times daily., Disp: , Rfl:  .  Oxycodone HCl 10 MG TABS, Take 10 mg by mouth 2 (two) times daily as needed (pain). , Disp: , Rfl:  .  QUEtiapine (SEROQUEL) 200 MG tablet, Take 200 mg by mouth at bedtime. , Disp: , Rfl:  .  sertraline (ZOLOFT) 100 MG tablet, Take 150 mg by mouth at bedtime. , Disp: , Rfl: 0 .  methocarbamol (ROBAXIN) 500 MG tablet, Take 1 tablet (500 mg total) by mouth every 6 (six) hours as needed for muscle spasms., Disp: 40 tablet, Rfl: 0      Objective:   Vitals:   07/27/20 1008  BP: 118/86  Pulse: (!) 51  Temp: 97.6 F (36.4 C)  TempSrc: Temporal  SpO2: 94%  Weight: 286 lb (129.7 kg)  Height:  (1.753 m)    Estimated body mass index is 42.23 kg/m as calculated from the following:   Height as of this encounter:  (1.753 m).   Weight as of this encounter: 286 lb (129.7 kg).  @  [employer]   07/27/20 1008  Weight: 286 lb (129.7 kg)     Physical Exam   General: No distress. Obese. Has cane Neuro: Alert and Oriented x 3. GCS 15. Speech normal Psych: Pleasant Resp:  Barrel Chest - no.  Wheeze - no, Crackles - no, No overt respiratory distress CVS: Normal heart sounds. Murmurs - no Ext: Stigmata of Connective Tissue Disease - no HEENT: Normal upper airway. PEERL +. No post nasal drip        Assessment:       ICD-10-CM   1. Interstitial lung disease due to connective tissue disease (HCC)  J84.89    M35.9   2. High risk medication use  Z79.899   3. Immunosuppressed status (HCC)  D84.9   4. Vaccine counseling  Z71.85        Plan:     Patient Instructions  Interstitial lung disease due to connective tissue disease (HCC) High risk medication use Immunosuppressed status  Clinically stable.  Pulmonary function test in February 2022 compared to June 2021 is 2-3.8% lower but this could easily be because of weight gain because overall  he is stable.  I will clinically consider you a stable compared to last visit.  Plan ' -Continue CellCept through the rheumatologist at [facility] -Continue Plaquenil through the rheumatologist at [facility] -Noted that you are off prednisone in 2021  -If pulmonary fibrosis gets worse then we can add nintedanib and consider you for research registry study called ILD-Pro  -Get spirometry and DLCO in 6 months  History of snoring Witnessed apneic spells OSA    - glad you are seeing Dr Val Eagle and are on CPAP  Plan  - per Dr Val Eagle  Vaccine counseling  PLAN  - get covid IgG blood work today  - if negative will refer for EVUSHELD  - Please talk to PCP [name], [name], PA-C -  and ensure you get  shingrix (GSK) inactivated vaccine against shingles  Follow-up -Spirometry and DLCO in 6 months -Return to see  Dr. Marchelle Gearing 30-minute ILD slot in 6 months      SIGNATURE    Dr. Kalman Shan, M.D., F.C.C.P,  Pulmonary and Critical Care Medicine Staff Physician, [facility] Center Director - Interstitial Lung Disease  Program  Pulmonary Fibrosis [facility] at [facility] [place], [place], [address]  Pager: [phone], If no answer or between  15:00h - 7:00h: call [phone] Telephone: [phone]  10:44 AM 07/27/2020

## 2020-07-27 NOTE — Addendum Note (Signed)
Addended by: Delrae Rend on: 07/27/2020 10:55 AM   Modules accepted: Orders

## 2020-07-27 NOTE — Patient Instructions (Addendum)
Interstitial lung disease due to connective tissue disease (HCC) High risk medication use Immunosuppressed status  Clinically stable.  Pulmonary function test in February 2022 compared to June 2021 is 2-3.8% lower but this could easily be because of weight gain because overall he is stable.  I will clinically consider you a stable compared to last visit.  Plan ' -Continue CellCept through the rheumatologist at [facility] -Continue Plaquenil through the rheumatologist at [facility] -Noted that you are off prednisone in 2021  -If pulmonary fibrosis gets worse then we can add nintedanib and consider you for research registry study called ILD-Pro  -Get spirometry and DLCO in 6 months  History of snoring Witnessed apneic spells OSA    - glad you are seeing Dr Val Eagle and are on CPAP  Plan  - per Dr Val Eagle  Vaccine counseling  PLAN  - get covid IgG blood work today  - if negative will refer for EVUSHELD  - Please talk to PCP [name], [name], PA-C -  and ensure you get  shingrix (GSK) inactivated vaccine against shingles  Follow-up -Spirometry and DLCO in 6 months -Return to see Dr. Marchelle Gearing 30-minute ILD slot in 6 months

## 2020-07-30 ENCOUNTER — Other Ambulatory Visit: Payer: Self-pay | Admitting: *Deleted

## 2020-07-30 ENCOUNTER — Other Ambulatory Visit: Payer: Medicare HMO

## 2020-07-30 DIAGNOSIS — M359 Systemic involvement of connective tissue, unspecified: Secondary | ICD-10-CM

## 2020-07-30 DIAGNOSIS — D849 Immunodeficiency, unspecified: Secondary | ICD-10-CM

## 2020-07-30 DIAGNOSIS — J8489 Other specified interstitial pulmonary diseases: Secondary | ICD-10-CM

## 2020-07-30 DIAGNOSIS — G4733 Obstructive sleep apnea (adult) (pediatric): Secondary | ICD-10-CM

## 2020-07-30 LAB — SARS-COV-2 IGG: SARS-COV-2 IgG: 41.09

## 2020-07-31 NOTE — Progress Notes (Signed)
Let her know tht she has good antibody levels to covid vaccine but because she is immune supporessed needs to stil be careful with masking

## 2020-08-01 DIAGNOSIS — R011 Cardiac murmur, unspecified: Secondary | ICD-10-CM | POA: Diagnosis not present

## 2020-08-01 DIAGNOSIS — M3213 Lung involvement in systemic lupus erythematosus: Secondary | ICD-10-CM | POA: Diagnosis not present

## 2020-08-02 DIAGNOSIS — G4733 Obstructive sleep apnea (adult) (pediatric): Secondary | ICD-10-CM | POA: Diagnosis not present

## 2020-08-02 DIAGNOSIS — G4710 Hypersomnia, unspecified: Secondary | ICD-10-CM | POA: Diagnosis not present

## 2020-08-08 DIAGNOSIS — Z96651 Presence of right artificial knee joint: Secondary | ICD-10-CM | POA: Diagnosis not present

## 2020-08-08 DIAGNOSIS — G894 Chronic pain syndrome: Secondary | ICD-10-CM | POA: Diagnosis not present

## 2020-08-08 DIAGNOSIS — Z96652 Presence of left artificial knee joint: Secondary | ICD-10-CM | POA: Diagnosis not present

## 2020-08-08 DIAGNOSIS — M25561 Pain in right knee: Secondary | ICD-10-CM | POA: Diagnosis not present

## 2020-08-08 DIAGNOSIS — M1711 Unilateral primary osteoarthritis, right knee: Secondary | ICD-10-CM | POA: Diagnosis not present

## 2020-08-08 DIAGNOSIS — Z79891 Long term (current) use of opiate analgesic: Secondary | ICD-10-CM | POA: Diagnosis not present

## 2020-08-08 DIAGNOSIS — E669 Obesity, unspecified: Secondary | ICD-10-CM | POA: Diagnosis not present

## 2020-08-13 DIAGNOSIS — R011 Cardiac murmur, unspecified: Secondary | ICD-10-CM | POA: Diagnosis not present

## 2020-08-22 DIAGNOSIS — M25552 Pain in left hip: Secondary | ICD-10-CM | POA: Insufficient documentation

## 2020-08-24 DIAGNOSIS — G4710 Hypersomnia, unspecified: Secondary | ICD-10-CM | POA: Diagnosis not present

## 2020-08-24 DIAGNOSIS — M7062 Trochanteric bursitis, left hip: Secondary | ICD-10-CM | POA: Diagnosis not present

## 2020-08-24 DIAGNOSIS — G4733 Obstructive sleep apnea (adult) (pediatric): Secondary | ICD-10-CM | POA: Diagnosis not present

## 2020-08-24 DIAGNOSIS — M5459 Other low back pain: Secondary | ICD-10-CM | POA: Diagnosis not present

## 2020-08-31 DIAGNOSIS — R419 Unspecified symptoms and signs involving cognitive functions and awareness: Secondary | ICD-10-CM | POA: Diagnosis not present

## 2020-09-03 DIAGNOSIS — R931 Abnormal findings on diagnostic imaging of heart and coronary circulation: Secondary | ICD-10-CM | POA: Diagnosis not present

## 2020-09-03 DIAGNOSIS — I429 Cardiomyopathy, unspecified: Secondary | ICD-10-CM | POA: Diagnosis not present

## 2020-09-03 DIAGNOSIS — R0600 Dyspnea, unspecified: Secondary | ICD-10-CM | POA: Diagnosis not present

## 2020-09-03 DIAGNOSIS — I208 Other forms of angina pectoris: Secondary | ICD-10-CM | POA: Diagnosis not present

## 2020-09-03 DIAGNOSIS — T699XXA Effect of reduced temperature, unspecified, initial encounter: Secondary | ICD-10-CM | POA: Diagnosis not present

## 2020-09-05 DIAGNOSIS — E669 Obesity, unspecified: Secondary | ICD-10-CM | POA: Diagnosis not present

## 2020-09-05 DIAGNOSIS — Z96652 Presence of left artificial knee joint: Secondary | ICD-10-CM | POA: Diagnosis not present

## 2020-09-05 DIAGNOSIS — M25561 Pain in right knee: Secondary | ICD-10-CM | POA: Diagnosis not present

## 2020-09-05 DIAGNOSIS — Z96651 Presence of right artificial knee joint: Secondary | ICD-10-CM | POA: Diagnosis not present

## 2020-09-05 DIAGNOSIS — Z79891 Long term (current) use of opiate analgesic: Secondary | ICD-10-CM | POA: Diagnosis not present

## 2020-09-05 DIAGNOSIS — M1711 Unilateral primary osteoarthritis, right knee: Secondary | ICD-10-CM | POA: Diagnosis not present

## 2020-09-05 DIAGNOSIS — G894 Chronic pain syndrome: Secondary | ICD-10-CM | POA: Diagnosis not present

## 2020-09-10 DIAGNOSIS — H52209 Unspecified astigmatism, unspecified eye: Secondary | ICD-10-CM | POA: Diagnosis not present

## 2020-09-10 DIAGNOSIS — H524 Presbyopia: Secondary | ICD-10-CM | POA: Diagnosis not present

## 2020-09-10 DIAGNOSIS — H5213 Myopia, bilateral: Secondary | ICD-10-CM | POA: Diagnosis not present

## 2020-09-14 DIAGNOSIS — G4710 Hypersomnia, unspecified: Secondary | ICD-10-CM | POA: Diagnosis not present

## 2020-09-14 DIAGNOSIS — R419 Unspecified symptoms and signs involving cognitive functions and awareness: Secondary | ICD-10-CM | POA: Diagnosis not present

## 2020-09-14 DIAGNOSIS — G4733 Obstructive sleep apnea (adult) (pediatric): Secondary | ICD-10-CM | POA: Diagnosis not present

## 2020-09-18 DIAGNOSIS — I208 Other forms of angina pectoris: Secondary | ICD-10-CM | POA: Diagnosis not present

## 2020-09-18 DIAGNOSIS — I429 Cardiomyopathy, unspecified: Secondary | ICD-10-CM | POA: Diagnosis not present

## 2020-09-18 DIAGNOSIS — R931 Abnormal findings on diagnostic imaging of heart and coronary circulation: Secondary | ICD-10-CM | POA: Diagnosis not present

## 2020-09-18 DIAGNOSIS — R0600 Dyspnea, unspecified: Secondary | ICD-10-CM | POA: Diagnosis not present

## 2020-09-19 DIAGNOSIS — R69 Illness, unspecified: Secondary | ICD-10-CM | POA: Diagnosis not present

## 2020-09-19 DIAGNOSIS — F411 Generalized anxiety disorder: Secondary | ICD-10-CM | POA: Diagnosis not present

## 2020-09-20 ENCOUNTER — Encounter: Payer: Self-pay | Admitting: Pulmonary Disease

## 2020-09-20 ENCOUNTER — Ambulatory Visit (INDEPENDENT_AMBULATORY_CARE_PROVIDER_SITE_OTHER): Payer: Medicare HMO | Admitting: Pulmonary Disease

## 2020-09-20 ENCOUNTER — Other Ambulatory Visit: Payer: Self-pay

## 2020-09-20 VITALS — BP 122/80 | HR 95 | Temp 97.5°F | Ht 71.0 in | Wt 287.0 lb

## 2020-09-20 DIAGNOSIS — M359 Systemic involvement of connective tissue, unspecified: Secondary | ICD-10-CM

## 2020-09-20 DIAGNOSIS — G4733 Obstructive sleep apnea (adult) (pediatric): Secondary | ICD-10-CM

## 2020-09-20 DIAGNOSIS — J8489 Other specified interstitial pulmonary diseases: Secondary | ICD-10-CM | POA: Diagnosis not present

## 2020-09-20 DIAGNOSIS — R0602 Shortness of breath: Secondary | ICD-10-CM | POA: Diagnosis not present

## 2020-09-20 DIAGNOSIS — Z9989 Dependence on other enabling machines and devices: Secondary | ICD-10-CM | POA: Diagnosis not present

## 2020-09-20 MED ORDER — BREO ELLIPTA 200-25 MCG/INH IN AEPB: 1.0000 | INHALATION_SPRAY | Freq: Every day | RESPIRATORY_TRACT | 6 refills | Status: DC

## 2020-09-20 NOTE — Patient Instructions (Signed)
I will see you back in about 6 months  Continue CPAP use on a nightly basis I will increase your dose of Breo from 100 to 200-prescription sent to pharmacy  Call with significant concerns

## 2020-09-20 NOTE — Progress Notes (Signed)
Shannon [name]    [id number]    1970/12/25  Primary Care Physician:[name], Pauline Good  Referring Physician: Eunice Blase, PA-C [address] [place],  [place] [address]  Chief complaint:   Patient with a history of snoring, nonrestorative sleep Found to have obstructive sleep apnea tolerating CPAP well  HPI:  Tolerating CPAP well Occasionally does take the mask off at night Just received a new mask which seems to be helping Less leaks  History of interstitial lung disease, lupus, follows up with Dr. 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 albuterol (VENTOLIN HFA) 108 (90 Base) MCG/ACT inhaler Inhale 2 puffs into the lungs every 6 (six) hours as needed for wheezing or shortness of breath. (Patient taking differently: Inhale 1 puff into the lungs every 4 (four) hours as needed for wheezing or shortness of breath.)  . buPROPion (WELLBUTRIN XL) 150 MG 24 hr tablet Take 150 mg by mouth daily.   . busPIRone (BUSPAR) 10 MG tablet Take 10 mg by mouth daily.  . famotidine (PEPCID) 20 MG tablet Take 20 mg by mouth at  bedtime.  . ferrous sulfate 325 (65 FE) MG tablet Take 650 mg by mouth daily with breakfast.  . fluticasone furoate-vilanterol (BREO ELLIPTA) 100-25 MCG/INH AEPB Inhale 1 puff into the lungs daily.  . hydroxychloroquine (PLAQUENIL) 200 MG tablet Take 400 mg by mouth daily.   Marland Kitchen lisinopril (ZESTRIL) 10 MG tablet Take 10 mg by mouth daily.  . mycophenolate (CELLCEPT) 500 MG tablet Take 2 tablets (1,000 mg total) by mouth 2 (two) times daily.  . Oxycodone HCl 10 MG TABS Take 10 mg by mouth 2 (two) times daily as needed (pain).   . QUEtiapine (SEROQUEL) 200 MG tablet Take 200 mg by mouth at bedtime.   . sertraline (ZOLOFT) 100 MG tablet Take 150 mg by mouth at bedtime.   . [DISCONTINUED] methocarbamol (ROBAXIN) 500 MG tablet Take 1 tablet (500 mg total) by mouth every 6 (six) hours as needed for muscle spasms.   No facility-administered encounter medications on file as of 09/20/2020.    Allergies as of 09/20/2020 - Review Complete 09/20/2020  Allergen Reaction Noted  . Asa [aspirin] Hives and Shortness Of Breath   . Penicillins Hives 12/18/2015  . Sulfa antibiotics      Past Medical History:  Diagnosis Date  . Anemia   . Anxiety   . Depression   . Dyspnea    Sometimes at rest  . G6PD deficiency   . ILD (interstitial lung disease) (HCC)   .  Lupus (HCC)   . Pneumonia   . Rheumatoid arthritis (HCC)   . Sleep apnea     Past Surgical History:  Procedure Laterality Date  . ABDOMINAL HYSTERECTOMY    . APPENDECTOMY  1980  . cardiac catherization  08/20/2017  . CARDIAC CATHETERIZATION N/A 05/10/2015   Procedure: Right Heart Cath;  Surgeon: Laurey Morale, MD;  Location[facility] INVASIVE CV LAB;  Service: Cardiovascular;  Laterality: N/A;  . CESAREAN SECTION  '95, '02, '07   X 3  . CHOLECYSTECTOMY  2010  . LAPAROSCOPIC HYSTERECTOMY  10/2015   have ovaries  . RIGHT HEART CATH N/A 08/20/2017   Procedure: RIGHT HEART CATH;  Surgeon: Laurey Morale, MD;  Location: [facility] INVASIVE CV LAB;   Service: Cardiovascular;  Laterality: N/A;  . TOTAL KNEE ARTHROPLASTY Left 04/04/2019   Procedure: TOTAL KNEE ARTHROPLASTY;  Surgeon: Ollen Gross, MD;  Location: [facility] ORS;  Service: Orthopedics;  Laterality: Left;   . TOTAL KNEE ARTHROPLASTY Right 04/30/2020   Procedure: TOTAL KNEE ARTHROPLASTY;  Surgeon: Ollen Gross, MD;  Location: [facility] ORS;  Service: Orthopedics;  Laterality: Right;   . TUBAL LIGATION      Family History  Problem Relation Age of Onset  . Sarcoidosis Mother   . Lupus Sister   . Healthy Daughter   . Healthy Son   . Healthy Son     Social History   Socioeconomic History  . Marital status: Married    Spouse name: [name]  . Number of children: 3  . Years of education: 89  . Highest education level: Not on file  Occupational History  . 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 Smoking status: Former Smoker    Packs/day: 0.50    Years: 14.00    Pack years: 7.00    Types: Cigarettes    Quit date: 09/25/2014    Years since quitting: 5.9  . Smokeless tobacco: Never Used  Vaping Use  . Vaping Use: Never used  Substance and Sexual Activity  . Alcohol use: Yes    Alcohol/week: 0.0 standard drinks    Comment: occas.  . Drug use: No  . Sexual activity: Not on file    Comment: Hysterectomy  Other Topics Concern  . Not on file  Social History Narrative   Married, lives with husband, children   Caffeine- coffee  1 daily   Social Determinants of Health   Financial Resource Strain: Not on file  Food Insecurity: Not on file  Transportation Needs: Not on file  Physical Activity: Not on file  Stress: Not on file  Social Connections: Not on file  Intimate Partner Violence: Not on file    Review of Systems  Constitutional: Positive for fatigue.  Respiratory: Positive for apnea and cough. Negative for shortness of breath.   Psychiatric/Behavioral: Positive for sleep disturbance.     Vitals:   09/20/20 1002  BP: 122/80  Pulse: 95  Temp: (!) 97.5 F (36.4 C)  SpO2: 97%     Physical Exam Constitutional:      Appearance: She is obese.  HENT:     Head: Normocephalic and atraumatic.     Mouth/Throat:     Mouth: Mucous membranes are moist.     Comments: Mallampati 4, crowded oropharynx Cardiovascular:     Rate and Rhythm: Normal rate and regular rhythm.     Pulses: Normal pulses.     Heart sounds:  Normal heart sounds. No murmur heard. No friction rub.  Pulmonary:     Effort: Pulmonary effort is normal. No respiratory distress.     Breath sounds: No stridor. Rales present. No wheezing or rhonchi.  Musculoskeletal:     Cervical back: No rigidity or tenderness.  Neurological:     Mental Status: She is alert.  Psychiatric:        Mood and Affect: Mood normal.    Results of the Epworth flowsheet 01/18/2020  Sitting and reading 1  Watching TV 2  Sitting, inactive in a public place (e.g. a theatre or a meeting) 0  As a passenger in a car for an hour without a break 1  Lying down to rest in the afternoon when circumstances permit 1  Sitting and talking to someone 0  Sitting quietly after a lunch without alcohol 2  In a car, while stopped for a few minutes in traffic 0  Total score 7   Data Reviewed: CT from 12/22/2019 reviewed  Download from her machine does reveal good compliance at 77% Residual AHI of 4 Does have occasional mask leaks  Assessment:   Mild obstructive sleep apnea -Tolerating CPAP well and feeling better  Nonrestorative sleep, daytime sleepiness -Symptoms are better  Shortness of breath -Increase Breo to 200  Interstitial lung disease -  Pathophysiology of sleep disordered breathing reviewed with the patient Treatment options for sleep disordered breathing discussed with the patient  Plan/Recommendations: Continue CPAP -New mask seem to be helping  Increase Breo to 200  Weight loss efforts encouraged  Tentative  follow-up in 6 months  I spent 30 minutes dedicated to the care of this patient on the date of this encounter to include previsit review of records, face-to-face time with the patient discussing conditions above, post visit ordering of testing, clinical documentation with electronic health record and communicated necessary findings to members of the patient's care team   Virl Diamond MD [facility] 09/20/2020, 10:07 AM  CC: [name], Edgardo Roys, PA-C

## 2020-09-24 DIAGNOSIS — G4710 Hypersomnia, unspecified: Secondary | ICD-10-CM | POA: Diagnosis not present

## 2020-09-24 DIAGNOSIS — G4733 Obstructive sleep apnea (adult) (pediatric): Secondary | ICD-10-CM | POA: Diagnosis not present

## 2020-09-30 DIAGNOSIS — Z20822 Contact with and (suspected) exposure to covid-19: Secondary | ICD-10-CM | POA: Diagnosis not present

## 2020-09-30 DIAGNOSIS — J019 Acute sinusitis, unspecified: Secondary | ICD-10-CM | POA: Diagnosis not present

## 2020-10-04 DIAGNOSIS — Z96652 Presence of left artificial knee joint: Secondary | ICD-10-CM | POA: Diagnosis not present

## 2020-10-04 DIAGNOSIS — Z96653 Presence of artificial knee joint, bilateral: Secondary | ICD-10-CM | POA: Diagnosis not present

## 2020-10-04 DIAGNOSIS — M25552 Pain in left hip: Secondary | ICD-10-CM | POA: Diagnosis not present

## 2020-10-04 DIAGNOSIS — Z96651 Presence of right artificial knee joint: Secondary | ICD-10-CM | POA: Diagnosis not present

## 2020-10-08 DIAGNOSIS — Z96651 Presence of right artificial knee joint: Secondary | ICD-10-CM | POA: Diagnosis not present

## 2020-10-08 DIAGNOSIS — G894 Chronic pain syndrome: Secondary | ICD-10-CM | POA: Diagnosis not present

## 2020-10-08 DIAGNOSIS — Z79891 Long term (current) use of opiate analgesic: Secondary | ICD-10-CM | POA: Diagnosis not present

## 2020-10-08 DIAGNOSIS — E669 Obesity, unspecified: Secondary | ICD-10-CM | POA: Diagnosis not present

## 2020-10-08 DIAGNOSIS — M5136 Other intervertebral disc degeneration, lumbar region: Secondary | ICD-10-CM | POA: Diagnosis not present

## 2020-10-08 DIAGNOSIS — M47816 Spondylosis without myelopathy or radiculopathy, lumbar region: Secondary | ICD-10-CM | POA: Diagnosis not present

## 2020-10-08 DIAGNOSIS — Z96652 Presence of left artificial knee joint: Secondary | ICD-10-CM | POA: Diagnosis not present

## 2020-10-08 DIAGNOSIS — M1711 Unilateral primary osteoarthritis, right knee: Secondary | ICD-10-CM | POA: Diagnosis not present

## 2020-10-08 DIAGNOSIS — M329 Systemic lupus erythematosus, unspecified: Secondary | ICD-10-CM | POA: Diagnosis not present

## 2020-10-08 DIAGNOSIS — M25561 Pain in right knee: Secondary | ICD-10-CM | POA: Diagnosis not present

## 2020-10-13 DIAGNOSIS — Z79899 Other long term (current) drug therapy: Secondary | ICD-10-CM | POA: Diagnosis not present

## 2020-10-16 DIAGNOSIS — Z0000 Encounter for general adult medical examination without abnormal findings: Secondary | ICD-10-CM | POA: Diagnosis not present

## 2020-10-16 DIAGNOSIS — Z79899 Other long term (current) drug therapy: Secondary | ICD-10-CM | POA: Diagnosis not present

## 2020-10-16 DIAGNOSIS — Z6841 Body Mass Index (BMI) 40.0 and over, adult: Secondary | ICD-10-CM | POA: Diagnosis not present

## 2020-10-16 DIAGNOSIS — R350 Frequency of micturition: Secondary | ICD-10-CM | POA: Diagnosis not present

## 2020-10-24 DIAGNOSIS — G4733 Obstructive sleep apnea (adult) (pediatric): Secondary | ICD-10-CM | POA: Diagnosis not present

## 2020-10-24 DIAGNOSIS — G4710 Hypersomnia, unspecified: Secondary | ICD-10-CM | POA: Diagnosis not present

## 2020-10-30 DIAGNOSIS — J029 Acute pharyngitis, unspecified: Secondary | ICD-10-CM | POA: Diagnosis not present

## 2020-10-31 DIAGNOSIS — M545 Low back pain, unspecified: Secondary | ICD-10-CM | POA: Diagnosis not present

## 2020-10-31 DIAGNOSIS — I429 Cardiomyopathy, unspecified: Secondary | ICD-10-CM | POA: Diagnosis not present

## 2020-10-31 DIAGNOSIS — J849 Interstitial pulmonary disease, unspecified: Secondary | ICD-10-CM | POA: Diagnosis not present

## 2020-10-31 DIAGNOSIS — R0600 Dyspnea, unspecified: Secondary | ICD-10-CM | POA: Diagnosis not present

## 2020-11-08 DIAGNOSIS — E669 Obesity, unspecified: Secondary | ICD-10-CM | POA: Diagnosis not present

## 2020-11-08 DIAGNOSIS — Z96652 Presence of left artificial knee joint: Secondary | ICD-10-CM | POA: Diagnosis not present

## 2020-11-08 DIAGNOSIS — M47816 Spondylosis without myelopathy or radiculopathy, lumbar region: Secondary | ICD-10-CM | POA: Diagnosis not present

## 2020-11-08 DIAGNOSIS — G894 Chronic pain syndrome: Secondary | ICD-10-CM | POA: Diagnosis not present

## 2020-11-08 DIAGNOSIS — M1711 Unilateral primary osteoarthritis, right knee: Secondary | ICD-10-CM | POA: Diagnosis not present

## 2020-11-08 DIAGNOSIS — M25561 Pain in right knee: Secondary | ICD-10-CM | POA: Diagnosis not present

## 2020-11-08 DIAGNOSIS — Z96651 Presence of right artificial knee joint: Secondary | ICD-10-CM | POA: Diagnosis not present

## 2020-11-08 DIAGNOSIS — Z79891 Long term (current) use of opiate analgesic: Secondary | ICD-10-CM | POA: Diagnosis not present

## 2020-11-12 DIAGNOSIS — M1711 Unilateral primary osteoarthritis, right knee: Secondary | ICD-10-CM | POA: Diagnosis not present

## 2020-11-12 DIAGNOSIS — Z96652 Presence of left artificial knee joint: Secondary | ICD-10-CM | POA: Diagnosis not present

## 2020-11-12 DIAGNOSIS — M329 Systemic lupus erythematosus, unspecified: Secondary | ICD-10-CM | POA: Diagnosis not present

## 2020-11-12 DIAGNOSIS — G894 Chronic pain syndrome: Secondary | ICD-10-CM | POA: Diagnosis not present

## 2020-11-12 DIAGNOSIS — M25561 Pain in right knee: Secondary | ICD-10-CM | POA: Diagnosis not present

## 2020-11-12 DIAGNOSIS — J849 Interstitial pulmonary disease, unspecified: Secondary | ICD-10-CM | POA: Diagnosis not present

## 2020-11-12 DIAGNOSIS — Z79891 Long term (current) use of opiate analgesic: Secondary | ICD-10-CM | POA: Diagnosis not present

## 2020-11-12 DIAGNOSIS — Z96651 Presence of right artificial knee joint: Secondary | ICD-10-CM | POA: Diagnosis not present

## 2020-11-14 DIAGNOSIS — R69 Illness, unspecified: Secondary | ICD-10-CM | POA: Diagnosis not present

## 2020-11-14 DIAGNOSIS — F411 Generalized anxiety disorder: Secondary | ICD-10-CM | POA: Diagnosis not present

## 2020-11-16 ENCOUNTER — Telehealth: Payer: Self-pay | Admitting: Pulmonary Disease

## 2020-11-16 DIAGNOSIS — M545 Low back pain, unspecified: Secondary | ICD-10-CM | POA: Diagnosis not present

## 2020-11-16 DIAGNOSIS — M5136 Other intervertebral disc degeneration, lumbar region: Secondary | ICD-10-CM | POA: Diagnosis not present

## 2020-11-20 NOTE — Telephone Encounter (Signed)
Faxed Dr. Trena Platt opinion and last ov note to Dr. Madelon Lips at [phone] -pr

## 2020-11-22 DIAGNOSIS — M5416 Radiculopathy, lumbar region: Secondary | ICD-10-CM | POA: Insufficient documentation

## 2020-11-24 DIAGNOSIS — G4733 Obstructive sleep apnea (adult) (pediatric): Secondary | ICD-10-CM | POA: Diagnosis not present

## 2020-11-24 DIAGNOSIS — G4710 Hypersomnia, unspecified: Secondary | ICD-10-CM | POA: Diagnosis not present

## 2020-11-28 DIAGNOSIS — G894 Chronic pain syndrome: Secondary | ICD-10-CM | POA: Diagnosis not present

## 2020-11-28 DIAGNOSIS — M545 Low back pain, unspecified: Secondary | ICD-10-CM | POA: Diagnosis not present

## 2020-11-28 DIAGNOSIS — Z5181 Encounter for therapeutic drug level monitoring: Secondary | ICD-10-CM | POA: Diagnosis not present

## 2020-11-28 DIAGNOSIS — J849 Interstitial pulmonary disease, unspecified: Secondary | ICD-10-CM | POA: Diagnosis not present

## 2020-11-28 DIAGNOSIS — M3213 Lung involvement in systemic lupus erythematosus: Secondary | ICD-10-CM | POA: Diagnosis not present

## 2020-11-29 DIAGNOSIS — J209 Acute bronchitis, unspecified: Secondary | ICD-10-CM | POA: Diagnosis not present

## 2020-11-29 DIAGNOSIS — Z20822 Contact with and (suspected) exposure to covid-19: Secondary | ICD-10-CM | POA: Diagnosis not present

## 2020-12-06 DIAGNOSIS — M25561 Pain in right knee: Secondary | ICD-10-CM | POA: Diagnosis not present

## 2020-12-06 DIAGNOSIS — M47816 Spondylosis without myelopathy or radiculopathy, lumbar region: Secondary | ICD-10-CM | POA: Diagnosis not present

## 2020-12-06 DIAGNOSIS — Z96652 Presence of left artificial knee joint: Secondary | ICD-10-CM | POA: Diagnosis not present

## 2020-12-06 DIAGNOSIS — Z79891 Long term (current) use of opiate analgesic: Secondary | ICD-10-CM | POA: Diagnosis not present

## 2020-12-06 DIAGNOSIS — M1711 Unilateral primary osteoarthritis, right knee: Secondary | ICD-10-CM | POA: Diagnosis not present

## 2020-12-06 DIAGNOSIS — Z96651 Presence of right artificial knee joint: Secondary | ICD-10-CM | POA: Diagnosis not present

## 2020-12-06 DIAGNOSIS — E669 Obesity, unspecified: Secondary | ICD-10-CM | POA: Diagnosis not present

## 2020-12-11 DIAGNOSIS — M5416 Radiculopathy, lumbar region: Secondary | ICD-10-CM | POA: Diagnosis not present

## 2020-12-13 DIAGNOSIS — G894 Chronic pain syndrome: Secondary | ICD-10-CM | POA: Diagnosis not present

## 2020-12-13 DIAGNOSIS — J849 Interstitial pulmonary disease, unspecified: Secondary | ICD-10-CM | POA: Diagnosis not present

## 2020-12-13 DIAGNOSIS — Z96651 Presence of right artificial knee joint: Secondary | ICD-10-CM | POA: Diagnosis not present

## 2020-12-13 DIAGNOSIS — Z79891 Long term (current) use of opiate analgesic: Secondary | ICD-10-CM | POA: Diagnosis not present

## 2020-12-13 DIAGNOSIS — Z96652 Presence of left artificial knee joint: Secondary | ICD-10-CM | POA: Diagnosis not present

## 2020-12-13 DIAGNOSIS — M25561 Pain in right knee: Secondary | ICD-10-CM | POA: Diagnosis not present

## 2020-12-13 DIAGNOSIS — M1711 Unilateral primary osteoarthritis, right knee: Secondary | ICD-10-CM | POA: Diagnosis not present

## 2020-12-13 DIAGNOSIS — M329 Systemic lupus erythematosus, unspecified: Secondary | ICD-10-CM | POA: Diagnosis not present

## 2020-12-14 DIAGNOSIS — G4733 Obstructive sleep apnea (adult) (pediatric): Secondary | ICD-10-CM | POA: Diagnosis not present

## 2020-12-14 DIAGNOSIS — G4710 Hypersomnia, unspecified: Secondary | ICD-10-CM | POA: Diagnosis not present

## 2020-12-20 DIAGNOSIS — I429 Cardiomyopathy, unspecified: Secondary | ICD-10-CM | POA: Diagnosis not present

## 2020-12-20 DIAGNOSIS — J849 Interstitial pulmonary disease, unspecified: Secondary | ICD-10-CM | POA: Diagnosis not present

## 2020-12-20 DIAGNOSIS — R0600 Dyspnea, unspecified: Secondary | ICD-10-CM | POA: Diagnosis not present

## 2020-12-24 DIAGNOSIS — G4733 Obstructive sleep apnea (adult) (pediatric): Secondary | ICD-10-CM | POA: Diagnosis not present

## 2020-12-24 DIAGNOSIS — G4710 Hypersomnia, unspecified: Secondary | ICD-10-CM | POA: Diagnosis not present

## 2020-12-25 DIAGNOSIS — M5136 Other intervertebral disc degeneration, lumbar region: Secondary | ICD-10-CM | POA: Diagnosis not present

## 2020-12-26 DIAGNOSIS — Z79891 Long term (current) use of opiate analgesic: Secondary | ICD-10-CM | POA: Diagnosis not present

## 2020-12-26 DIAGNOSIS — M25561 Pain in right knee: Secondary | ICD-10-CM | POA: Diagnosis not present

## 2020-12-26 DIAGNOSIS — Z96652 Presence of left artificial knee joint: Secondary | ICD-10-CM | POA: Diagnosis not present

## 2020-12-26 DIAGNOSIS — M329 Systemic lupus erythematosus, unspecified: Secondary | ICD-10-CM | POA: Diagnosis not present

## 2020-12-26 DIAGNOSIS — M1711 Unilateral primary osteoarthritis, right knee: Secondary | ICD-10-CM | POA: Diagnosis not present

## 2020-12-26 DIAGNOSIS — J849 Interstitial pulmonary disease, unspecified: Secondary | ICD-10-CM | POA: Diagnosis not present

## 2020-12-26 DIAGNOSIS — G894 Chronic pain syndrome: Secondary | ICD-10-CM | POA: Diagnosis not present

## 2020-12-26 DIAGNOSIS — Z96651 Presence of right artificial knee joint: Secondary | ICD-10-CM | POA: Diagnosis not present

## 2021-01-03 DIAGNOSIS — Z96652 Presence of left artificial knee joint: Secondary | ICD-10-CM | POA: Diagnosis not present

## 2021-01-03 DIAGNOSIS — Z96651 Presence of right artificial knee joint: Secondary | ICD-10-CM | POA: Diagnosis not present

## 2021-01-03 DIAGNOSIS — M47816 Spondylosis without myelopathy or radiculopathy, lumbar region: Secondary | ICD-10-CM | POA: Diagnosis not present

## 2021-01-03 DIAGNOSIS — G894 Chronic pain syndrome: Secondary | ICD-10-CM | POA: Diagnosis not present

## 2021-01-03 DIAGNOSIS — M1711 Unilateral primary osteoarthritis, right knee: Secondary | ICD-10-CM | POA: Diagnosis not present

## 2021-01-03 DIAGNOSIS — Z1389 Encounter for screening for other disorder: Secondary | ICD-10-CM | POA: Diagnosis not present

## 2021-01-03 DIAGNOSIS — M25561 Pain in right knee: Secondary | ICD-10-CM | POA: Diagnosis not present

## 2021-01-03 DIAGNOSIS — E669 Obesity, unspecified: Secondary | ICD-10-CM | POA: Diagnosis not present

## 2021-01-03 DIAGNOSIS — Z79891 Long term (current) use of opiate analgesic: Secondary | ICD-10-CM | POA: Diagnosis not present

## 2021-01-11 DIAGNOSIS — F411 Generalized anxiety disorder: Secondary | ICD-10-CM | POA: Diagnosis not present

## 2021-01-11 DIAGNOSIS — R69 Illness, unspecified: Secondary | ICD-10-CM | POA: Diagnosis not present

## 2021-01-12 DIAGNOSIS — G894 Chronic pain syndrome: Secondary | ICD-10-CM | POA: Diagnosis not present

## 2021-01-12 DIAGNOSIS — Z96651 Presence of right artificial knee joint: Secondary | ICD-10-CM | POA: Diagnosis not present

## 2021-01-12 DIAGNOSIS — M25561 Pain in right knee: Secondary | ICD-10-CM | POA: Diagnosis not present

## 2021-01-12 DIAGNOSIS — J849 Interstitial pulmonary disease, unspecified: Secondary | ICD-10-CM | POA: Diagnosis not present

## 2021-01-12 DIAGNOSIS — M1711 Unilateral primary osteoarthritis, right knee: Secondary | ICD-10-CM | POA: Diagnosis not present

## 2021-01-12 DIAGNOSIS — Z96652 Presence of left artificial knee joint: Secondary | ICD-10-CM | POA: Diagnosis not present

## 2021-01-12 DIAGNOSIS — M329 Systemic lupus erythematosus, unspecified: Secondary | ICD-10-CM | POA: Diagnosis not present

## 2021-01-12 DIAGNOSIS — Z79891 Long term (current) use of opiate analgesic: Secondary | ICD-10-CM | POA: Diagnosis not present

## 2021-01-24 DIAGNOSIS — G4733 Obstructive sleep apnea (adult) (pediatric): Secondary | ICD-10-CM | POA: Diagnosis not present

## 2021-01-24 DIAGNOSIS — I25119 Atherosclerotic heart disease of native coronary artery with unspecified angina pectoris: Secondary | ICD-10-CM | POA: Diagnosis not present

## 2021-01-24 DIAGNOSIS — M0550 Rheumatoid polyneuropathy with rheumatoid arthritis of unspecified site: Secondary | ICD-10-CM | POA: Diagnosis not present

## 2021-01-24 DIAGNOSIS — J45909 Unspecified asthma, uncomplicated: Secondary | ICD-10-CM | POA: Diagnosis not present

## 2021-01-24 DIAGNOSIS — R69 Illness, unspecified: Secondary | ICD-10-CM | POA: Diagnosis not present

## 2021-01-24 DIAGNOSIS — I10 Essential (primary) hypertension: Secondary | ICD-10-CM | POA: Diagnosis not present

## 2021-01-24 DIAGNOSIS — G4700 Insomnia, unspecified: Secondary | ICD-10-CM | POA: Diagnosis not present

## 2021-01-24 DIAGNOSIS — Z6841 Body Mass Index (BMI) 40.0 and over, adult: Secondary | ICD-10-CM | POA: Diagnosis not present

## 2021-01-24 DIAGNOSIS — M3219 Other organ or system involvement in systemic lupus erythematosus: Secondary | ICD-10-CM | POA: Diagnosis not present

## 2021-01-24 DIAGNOSIS — G4710 Hypersomnia, unspecified: Secondary | ICD-10-CM | POA: Diagnosis not present

## 2021-01-26 DIAGNOSIS — Z79891 Long term (current) use of opiate analgesic: Secondary | ICD-10-CM | POA: Diagnosis not present

## 2021-01-26 DIAGNOSIS — M25561 Pain in right knee: Secondary | ICD-10-CM | POA: Diagnosis not present

## 2021-01-26 DIAGNOSIS — G894 Chronic pain syndrome: Secondary | ICD-10-CM | POA: Diagnosis not present

## 2021-01-26 DIAGNOSIS — M329 Systemic lupus erythematosus, unspecified: Secondary | ICD-10-CM | POA: Diagnosis not present

## 2021-01-26 DIAGNOSIS — J849 Interstitial pulmonary disease, unspecified: Secondary | ICD-10-CM | POA: Diagnosis not present

## 2021-01-26 DIAGNOSIS — M1711 Unilateral primary osteoarthritis, right knee: Secondary | ICD-10-CM | POA: Diagnosis not present

## 2021-01-26 DIAGNOSIS — Z96651 Presence of right artificial knee joint: Secondary | ICD-10-CM | POA: Diagnosis not present

## 2021-01-26 DIAGNOSIS — Z96652 Presence of left artificial knee joint: Secondary | ICD-10-CM | POA: Diagnosis not present

## 2021-01-31 DIAGNOSIS — G894 Chronic pain syndrome: Secondary | ICD-10-CM | POA: Diagnosis not present

## 2021-01-31 DIAGNOSIS — E669 Obesity, unspecified: Secondary | ICD-10-CM | POA: Diagnosis not present

## 2021-01-31 DIAGNOSIS — Z1389 Encounter for screening for other disorder: Secondary | ICD-10-CM | POA: Diagnosis not present

## 2021-01-31 DIAGNOSIS — M47816 Spondylosis without myelopathy or radiculopathy, lumbar region: Secondary | ICD-10-CM | POA: Diagnosis not present

## 2021-01-31 DIAGNOSIS — Z79891 Long term (current) use of opiate analgesic: Secondary | ICD-10-CM | POA: Diagnosis not present

## 2021-01-31 DIAGNOSIS — M25561 Pain in right knee: Secondary | ICD-10-CM | POA: Diagnosis not present

## 2021-01-31 DIAGNOSIS — Z96651 Presence of right artificial knee joint: Secondary | ICD-10-CM | POA: Diagnosis not present

## 2021-01-31 DIAGNOSIS — Z96652 Presence of left artificial knee joint: Secondary | ICD-10-CM | POA: Diagnosis not present

## 2021-01-31 DIAGNOSIS — M1711 Unilateral primary osteoarthritis, right knee: Secondary | ICD-10-CM | POA: Diagnosis not present

## 2021-02-04 DIAGNOSIS — Z1231 Encounter for screening mammogram for malignant neoplasm of breast: Secondary | ICD-10-CM | POA: Diagnosis not present

## 2021-02-12 DIAGNOSIS — Z79891 Long term (current) use of opiate analgesic: Secondary | ICD-10-CM | POA: Diagnosis not present

## 2021-02-12 DIAGNOSIS — J849 Interstitial pulmonary disease, unspecified: Secondary | ICD-10-CM | POA: Diagnosis not present

## 2021-02-12 DIAGNOSIS — M25561 Pain in right knee: Secondary | ICD-10-CM | POA: Diagnosis not present

## 2021-02-12 DIAGNOSIS — Z96652 Presence of left artificial knee joint: Secondary | ICD-10-CM | POA: Diagnosis not present

## 2021-02-12 DIAGNOSIS — M329 Systemic lupus erythematosus, unspecified: Secondary | ICD-10-CM | POA: Diagnosis not present

## 2021-02-12 DIAGNOSIS — H5213 Myopia, bilateral: Secondary | ICD-10-CM | POA: Diagnosis not present

## 2021-02-12 DIAGNOSIS — M1711 Unilateral primary osteoarthritis, right knee: Secondary | ICD-10-CM | POA: Diagnosis not present

## 2021-02-12 DIAGNOSIS — H52209 Unspecified astigmatism, unspecified eye: Secondary | ICD-10-CM | POA: Diagnosis not present

## 2021-02-12 DIAGNOSIS — Z96651 Presence of right artificial knee joint: Secondary | ICD-10-CM | POA: Diagnosis not present

## 2021-02-12 DIAGNOSIS — G894 Chronic pain syndrome: Secondary | ICD-10-CM | POA: Diagnosis not present

## 2021-02-24 DIAGNOSIS — G4733 Obstructive sleep apnea (adult) (pediatric): Secondary | ICD-10-CM | POA: Diagnosis not present

## 2021-02-24 DIAGNOSIS — G4710 Hypersomnia, unspecified: Secondary | ICD-10-CM | POA: Diagnosis not present

## 2021-02-26 DIAGNOSIS — M1711 Unilateral primary osteoarthritis, right knee: Secondary | ICD-10-CM | POA: Diagnosis not present

## 2021-02-26 DIAGNOSIS — Z79891 Long term (current) use of opiate analgesic: Secondary | ICD-10-CM | POA: Diagnosis not present

## 2021-02-26 DIAGNOSIS — Z96651 Presence of right artificial knee joint: Secondary | ICD-10-CM | POA: Diagnosis not present

## 2021-02-26 DIAGNOSIS — Z96652 Presence of left artificial knee joint: Secondary | ICD-10-CM | POA: Diagnosis not present

## 2021-02-26 DIAGNOSIS — M329 Systemic lupus erythematosus, unspecified: Secondary | ICD-10-CM | POA: Diagnosis not present

## 2021-02-26 DIAGNOSIS — J849 Interstitial pulmonary disease, unspecified: Secondary | ICD-10-CM | POA: Diagnosis not present

## 2021-02-26 DIAGNOSIS — M25561 Pain in right knee: Secondary | ICD-10-CM | POA: Diagnosis not present

## 2021-02-26 DIAGNOSIS — G894 Chronic pain syndrome: Secondary | ICD-10-CM | POA: Diagnosis not present

## 2021-03-04 DIAGNOSIS — R69 Illness, unspecified: Secondary | ICD-10-CM | POA: Diagnosis not present

## 2021-03-04 DIAGNOSIS — F411 Generalized anxiety disorder: Secondary | ICD-10-CM | POA: Diagnosis not present

## 2021-03-04 DIAGNOSIS — F332 Major depressive disorder, recurrent severe without psychotic features: Secondary | ICD-10-CM | POA: Diagnosis not present

## 2021-03-05 ENCOUNTER — Other Ambulatory Visit: Payer: Self-pay

## 2021-03-05 ENCOUNTER — Ambulatory Visit (INDEPENDENT_AMBULATORY_CARE_PROVIDER_SITE_OTHER): Payer: Medicare HMO | Admitting: Internal Medicine

## 2021-03-05 VITALS — BP 120/80 | HR 98 | Ht 69.48 in | Wt 300.4 lb

## 2021-03-05 DIAGNOSIS — M359 Systemic involvement of connective tissue, unspecified: Secondary | ICD-10-CM

## 2021-03-05 DIAGNOSIS — Z23 Encounter for immunization: Secondary | ICD-10-CM | POA: Diagnosis not present

## 2021-03-05 DIAGNOSIS — D849 Immunodeficiency, unspecified: Secondary | ICD-10-CM

## 2021-03-05 DIAGNOSIS — J849 Interstitial pulmonary disease, unspecified: Secondary | ICD-10-CM | POA: Diagnosis not present

## 2021-03-05 DIAGNOSIS — J8489 Other specified interstitial pulmonary diseases: Secondary | ICD-10-CM

## 2021-03-05 LAB — PULMONARY FUNCTION TEST
DL/VA % pred: 97 %
DL/VA: 4.04 ml/min/mmHg/L
DLCO cor % pred: 60 %
DLCO cor: 15.34 ml/min/mmHg
DLCO unc % pred: 60 %
DLCO unc: 15.34 ml/min/mmHg
FEF 25-75 Pre: 2.94 L/sec
FEF2575-%Pred-Pre: 100 %
FEV1-%Pred-Pre: 77 %
FEV1-Pre: 2.24 L
FEV1FVC-%Pred-Pre: 103 %
FEV6-%Pred-Pre: 75 %
FEV6-Pre: 2.64 L
FEV6FVC-%Pred-Pre: 102 %
FVC-%Pred-Pre: 73 %
FVC-Pre: 2.64 L
Pre FEV1/FVC ratio: 85 %
Pre FEV6/FVC Ratio: 100 %

## 2021-03-05 NOTE — Progress Notes (Signed)
Spirometry and Dlco done today. 

## 2021-03-05 NOTE — Progress Notes (Signed)
PCP [name],[name], PA-C Referred by Dr [name]  HPI   IOV 08/23/2014 His blood work and a CT scan in a few weeks and come back c Chief Complaint  Patient presents with   Pulmonary Consult    Pt referred by Dr. Corliss Skains for ILD. Pt c/o SOB with activity and rest, dry cough and chest tightness also with and without activity.    50 year old female referred for evaluation of autoimmune interstitial lung disease. She presents with her husband. In 2010 while living in [place] she reports she was diagnosed with lupus associated with rheumatoid arthritis in her joints. In 2012 she moved to live in [facility] and several months after that started noticing insidious onset of shortness of breath. Local rheumatologist diagnosed her with interstitial lung disease. She was referred to Dr. Herma Carson at [facility] in [facility] and was started on CellCept/prednisone for autoimmune interstitial lung disease he and however, sometime around 2 years ago she ran out of medical insurance and stopped taking these medications. During this time her dyspnea has progressed. It is currently rated as moderate to severe. It is present on exertion and relieved by rest. Even minimal amount of exertion makes her very ddyspneic. Now she has insurance and she did see Dr. Frederik Pear locally and she has autoimmune panel lab ordered. In addition exam revealed crackles and there for she's been referred here for reevaluation of interstitial lung disease and dyspnea. Dyspnea is associated with some chest tightness but no chest pain. This no associated dizzine   11/24/2014 Follow UP ILD  [name]  She has autoimmune ILD with RA/Lupus  She was seen 6 weeks ago, restarted on Cellcept.  Previously on cellcept but lost her insurance until recently.  On low dose prednisone 5mg  daily .  Last CT chest 4/4 showed ILD changes similar to 2013. Echo was ok with EFG 55-60%, nml PAP . In  March .   Did not take dapsone ,due to  GP6D deficiency.  Labs ok last week with nml LFT , no sign change in hbg. /wbc.  She is feeling better. Does feel her breathing is some better.  No flare of cough or wheezing.     OV 01/16/2015  Chief Complaint  Patient presents with   Follow-up    Pt c/o DOE, mild dry cough, and chest tightness when SOB. Pt states the chest tightness has improved).    follow-up interstitial lung disease in the setting of rheumatoid arthritis Follow-up high risk medication use - on CellCept and prednisone since mid April 2016  - Presents with her husband. Both give a history. Overall she is doing better in terms of dyspnea after starting CellCept and prednisone. However the improvement this only moderate. She still left with a residual moderate dyspnea on exertion that is also made worse with bending or heart air and improved with rest and cool air. It is associated with some cough and wheezing. She takes albuterol inhaler which she feels helps only somewhat but not fully and not quickly enough. She is frustrated by this. In addition she's complaining of some associated right lower back paraspinal spasm for which massage gives her relief. He has never attended pulmonary rehabilitation.  - Lab review shows she has had problems with potassium and has had potassium supplementation. Last lab check was 12/08/2014. She is due for lab test right now   OV 03/28/2015   Chief Complaint  Patient presents  with   Follow-up    3 month follow up. Pt states that she is still having some problems with her breathing. Pt c/o of feeling chest tightness, chest pain and cough that is dry. Pt denies wheeze. Pt states that she did trial the Advair and does feel that it helped some.   Follow-up interstitial lung disease secondary to autoimmune process and associated dyspnea that seems out of proportion\  She presents with her husband. She continues on CellCept and prednisone. In the last  visit approximately 2-3 months ago she had out of proportion dyspnea. I gave her some Advair to try. She says this only helped a little bit. Overall she says that dyspnea still persists. It is worse than in the spring 2016 when she started CellCept and prednisone. It is stable since July 2016. It is moderate in intensity. Occurs randomly at rest but also with exertion. Occasionally relieved by rest but also happens at rest. Heat and humidity make it worse. Advair helped a little bit only. This no associated chest tightness or wheezing. She did try and enroll  in pulmonary rehabilitation but could not afford the the startup program and is waiting to hear from them for the maintenance program. She is frustrated by all the symptoms.  Pulmonary function test October 15,016 today FVC 2.55 L/64%, total lung capacity 4 L/65% and diffusion 14.5/42%. Overall no change since April 2016   OV 07/04/2015  Chief Complaint  Patient presents with   Follow-up    pt. states breathing is baseline. SOB. dry cough. wheezing. occ. chest pain/tightness. feels her airway is blocked.   Follow-up interstitial lung disease admitted autoimmune processes and associated out of proportion dyspnea  She presents again with her husband. She continues on CellCept 2 g twice daily associated with prednisone. She cannot take Bactrim or dapsone due to G6PD deficiency. Last seen in the fall of 2016. She was having out of proportion dyspnea. Rated cardia pulmonary stress test and she could not tolerate this test. She then underwent right heart catheterization mid-November 2016 with Dr. Marca Ancona. Review the tests this is normal. Overall stable but she and husband still continued to complain about this resting dyspnea associated with wheezing. They hear noises in the chest. Sometime she gasps for air even at rest. She feels it comes from the chest but the husband points to the throat. I offered second opinion at [facility] because of  this unusual symptoms but they declined citing distance. She was supposed to attend pulmonary rehabilitation but they cannot afford a $60 co-pay twice a week 8 weeks. Offered ENT evaluation that agreeable but they wanted done in [place] which is closer logistically. In addition patient is contemplating now applying for disability. 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  Walking desaturation test 185 feet 3 laps and rheumatic: No desaturation    OV 10/12/2015  Chief Complaint  Patient presents with   Follow-up    PFT today.  breathing is better.  Pt is currently on STD, [employer] approved STD until today, wants to know today if pt is able to return to work, any restrictions.  Pt states that she has been more stable while out of work.      Follow-up interstitial lung disease due to autoimmune processes not otherwise specified  Last seen January 2017. Since then she has been on short-term disability and out of work. She does heavy manual work. The lack of work estimated dyspnea better. She did pulmonary  function test today that I personally visualized and overall it is same. There are no new issues. She is on CellCept and she is tolerating this fine. Last visit she had some anemia we follow this up and the anemia was better. Also hypokalemia resolved by February 2017. She is due for blood work today. 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 This showed up at last visit. She saw an ENT in [place] within referred her to [facility]. She does not want to go to [facility] She ended up seeing Dr. Ezzard Standing locally. But now she is going to see Dr.  Dina Rich [facility]   Pulmonary function test today 10/12/2015 shows postbronchodilator FVC 2.65 L/67%. FEV1 2.34 L/72% which is up 17% positive bronchodilator response. Ratio of 80/106%. Total lung capacity of 3.8/62%. DLCO 18.36/52%.  Overall consistent with restriction and lung parenchymal disease. Overall PFTs are stable compared to April 2016 but perhaps in DLCO slightly better   OV 04/15/2016  Chief Complaint  Patient presents with   Interstitial Lung Disease    Breathing is unchanged since last OV. Reports SOB, coughing. Cough is non productive. Denies chest tightness or wheezing.   Follow-up interstitial lung disease due to autoimmune processes not otherwise specified. On CellCept and prednisone. Not on Bactrim prophylaxis due to G6PD deficiency  Last visit April 2017. At that time based on pulmonary function tests showed stability and ILD for a year. 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 She did see local ENT doctor [place] and apparently has been reassured. At this point in time she is applying for long-term disability at work but the work discharged from services and she is now applying for Social Security disability. Her dyspnea stable since the interim. She's also had hysterectomy but for the last few weeks her cough is worse and it is dry. This no fever. Is no weight loss or chills. She says she's compliant with her CellCept and prednisone.  OV 05/29/2016  Chief Complaint  Patient presents with   Follow-up    Pt states she still has harsh dry cough, pt states her SOB is unchanged and chest tightness when SOB or when coughing a lot. Pt deneis f/c/s.     Follow-up interstitial lung disease due to autoimmune processes not otherwise specified. On CellCept and prednisone since April 2016. Not on Bactrim prophylaxis due to G6PD deficiency  Domique returns for follow-up. She had high resolution CT chest that shows stability and interstitial lung disease since April 2016. She Pulmicort function test that shows mild improvement since April 2016. Therefore it appears that her CellCept and prednisone is helping her she'll lung disease related to collagen-vascular disease. However she  tells me that she continues to have chronic cough for the last few to several months. It is progressive. It is dry. It is worse than her baseline. The dyspnea is unchanged. This no fever or weight loss or chills   OV 07/24/2016  Chief Complaint  Patient presents with   Follow-up    Pt states she feels the Arnuity has been helping her breathing. Pt c/o dry cough and occ chest tightness.   rec    ICD-9-CM ICD-10-CM   1. ILD (interstitial lung disease) (HCC) 515 J84.9   2. Chronic cough 786.2 R05   3. High risk medication use V58.69 Z79.899    Clinically improved with  cellcept/prednisone and arnuity Blood work ok dec 2017  pLAN - start pulm rehab at [facility] - continue  aruity daily - continue cellcept and prednisone  Followup - 6 months do Pre-bd spiro and dlco only. No lung volume or bd response. No post-bd spiro - 6 months fu Dr Marchelle Gearing after above or sooner if needed     12/15/2016  f/u ov/Wert re: cough dry on arnuity  Chief Complaint  Patient presents with   Follow-up    Breathing is unchanged since her last visit. Pt states she is here to f/u on recent ABG result. She c/o feeling tired all of the time. She has occ cough- non prod.       Finished at Rehab   beginning  Of May  2018 and trouble walking fast or uphill Waking up with ha's since rehab sev days a week  Cough is new x one month mostly dry and day > noct   No obvious day to day or daytime variability or assoc excess/ purulent sputum or mucus plugs or hemoptysis or cp or chest tightness, subjective wheeze or overt sinus or hb symptoms. No unusual exp hx or h/o childhood pna/ asthma or knowledge of premature birth.   OV 02/13/2017  Chief Complaint  Patient presents with   Follow-up    cough/ILD follow up - prod cough with brownish/green mucus with tightness and chest pain x2 days.  denies any f/c/s, hemoptysis.  PFT done today.    Follow-up interstitial lung disease due to autoimmune processes not  otherwise specified. On CellCept and prednisone since April 2016. Not on Bactrim prophylaxis due to G6PD deficiency  50 year old female immunosuppressed with CellCept and prednisone. Not seen her in many months. Currently taking prednisone 3 mg per day and CellCept 1000 mg twice daily. She cannot do Bactrim prophylaxis because of G6PD deficiency. She tells me that overall her health has been stable but in the last few weeks noticing increased shortness of breath in the last few days this increased cough and change in color of sputum to green and increased chest tightness and a feeling that she is getting acute bronchitis. She also was contemplated going to the emergency room a few days ago but now she is better. There is no obvious fever or chills or hemoptysis or edema paroxysmal nocturnal dyspnea or orthopnea. Pulmonary function test today shows 10% decline in FVC and DLCO compared to baseline.   OV 06/08/2017  Chief Complaint  Patient presents with   Follow-up    Feeling about the same as last visit. Still having chest tightness and wheezing at times, Sounds hoarse.     Follow-up interstitial lung disease due to autoimmune processes not otherwise specified. On CellCept and prednisone since April 2016. Not on Bactrim prophylaxis due to G6PD deficiency. Normal Right heart cath Nov 2016.  Last high-resolution CT November 2017  Last visit August 2018.  There is a routine follow-up.  Overall she feels stable.  FVC shows stability since August 2018 but declined since 1 year ago.  DLCO shows continued decline.  Though her lung health is stable.  She says she lost her 1 only biological sister last week due to lupus.  The sister was only 69 and lived in [place].  There are no new issues. Walking desaturation test on 06/08/2017 185 feet x 3 laps on ROOM AIR:  did not desaturate. Rest pulse ox was 100%%, final pulse ox was 100%. HR response 81/min at rest to 109/min at peak  exertion. Patient Shannon [name]  Did not Desaturate < 88% . Shannon [name] did nto  Desaturated </= 3% points. Shannon [name] yes did get tachyardic   OV 10/15/2017   Follow-up interstitial lung disease due to autoimmune processes not otherwise specified. On CellCept and prednisone since April 2016. Not on Bactrim prophylaxis due to G6PD deficiency. Normal Right heart cath Nov 2016 and feb 2019.  Last high-resolution CT November 2017 and dec 2018 without progresion   Last visit December 2018.  This is a routine follow-up.  In the interim she had high-resolution CT scan of the chest that did not show any progression in interstitial lung disease between 2017 and 2018.Marland Kitchen  Therefore we did an echocardiogram that showed slight elevation in pulmonary artery systolic pressure.  Therefore we sent it to her repeat right heart catheterization and this was normal as documented below.  Overall she feels stable compared to one year ago but has declined compared to 2 years ago.  She is also complaining about a new recurrence of cough that happens mostly at night despite Symbicort and prednisone and CellCept.  It wakes her up.  It is moderate in intensity.  There is no associated wheezing or edema orthopnea.  It happens randomly.  There is associated heartburn.  She is on nothing for acid reflux.       Right Heart Pressures 08/20/17 [facility] Procedural Findings: Hemodynamics (mmHg) RA mean 3 RV 30/6 PA 23/8, mean 14 PCWP mean 8  Oxygen saturations: PA 72% AO 98%  Cardiac Output (Fick) 6.73  Cardiac Index (Fick) 3.06  Cardiac Output (Thermo) 6.94 Cardiac Index (Thermo) 3.15    OV 12/01/2017  Chief Complaint  Patient presents with   Follow-up    Pt has SOB with exertion, wheezing, some chest tightness. Pt was dry cough more at bedtime.      Follow-up interstitial lung disease due to autoimmune processes not otherwise specified. On CellCept and prednisone since April 2016. Not on Bactrim prophylaxis due to G6PD deficiency.  Normal Right heart cath Nov 2016 and feb 2019.  Last high-resolution CT November 2017 and dec 2018 without progresion   This visit is to see if she is got progressive interstitial lung disease.  Last visit she was complaining of more cough.  Therefore we added some acid reflux treatment.  She says after the acid reflux treatment symptoms have actually improved.  This contradicts what she told the CMA at intake.  Overall she is feeling stable.  However she does have a new episode of orthostatic dizziness going on at a mild level for the last 1 week.  Today after doing pulmonary function test she did feel dizzy.  Therefore we checked orthostatics on her and she got tachycardic standing up and her blood pressure did drop although still within normal limits.  She does not have any chest pain.  Lung function test shows continued stability in the last 1 year including compared to the most recent attempt.  OV 03/08/2018  Subjective:  Patient ID: Shannon [name], female , DOB: 1971-03-04 , age 17 y.o. , MRN: [id number] , ADDRESS: [address]  [facility] [address]   03/08/2018 -   Chief Complaint  Patient presents with   Follow-up    Spirometry performed today. Pt states she is still having some mild problems with dizziness but not as bad as last visit. Pt also states she has been having problems with headaches x1 month. Pt states her breathing is  about the same as last visit and also states she still has the dry cough.    Follow-up interstitial lung disease due to autoimmune processes not otherwise specified. On CellCept and prednisone since April 2016. Not on Bactrim prophylaxis due to G6PD deficiency. Normal Right heart cath Nov 2016 and feb 2019.  Last high-resolution CT November 2017 and dec 2018 without progresion     HPI Shannon [name] 50 y.o. - connective tissue disease ILD. Follow-up. Last seen June 2019 by she started having new onset dizziness. It seemed orthostatic. She says since then it is  spontaneously improved but still persists. She is also dealing with fatigue issues. In 02/26/2018 she felt she had a lupus flare saw Dr. Algis Downs and given a prednisone burst. Autoimmune profile at that time showed continued positive ANA titer but normal complements. She had a CBC that showed a drop in hemoglobin by 1 g percent. Her baseline is around 11.5 g percent and currently it is around 10.5 g percent. She says after the prednisone burst a lupus flare symptoms and fatigue have improved although it still persists. If it is actually worse in the last few months. She had pulmonary function test that shows continued improvement especially following the recent prednisone burst. Walking desaturation test was normal other than tachycardia. We observed her to be walking very slowly in a very deconditioned and fatigued fashion. 02/26/2089 and liver function and renal function reviewed and this was normal. Medication review shows she is on oxycodone and gabapentin for back pain. She says she's been on gabapentin for the last 1 year. It appears that the temporal sequence of fatigue and dizziness might be related to gabapentin but she is not so sure.       Results for Shannon [name], Shannon [name] (MRN [id number]) as of 03/08/2018 10:51  Ref. Range 02/26/2018 10:53  Hemoglobin Latest Ref Range: 11.7 - 15.5 g/dL 14.7 (L)  Results for ZANNA, HAWN (MRN [id number]) as of 03/08/2018 10:51  Ref. Range 02/26/2018 10:53  Creatinine Latest Ref Range: 0.50 - 1.10 mg/dL 8.65  Results for SANIA, NOY (MRN [id number]) as of 03/08/2018 10:51  Ref. Range 02/26/2018 10:53  AST Latest Ref Range: 10 - 35 U/L 19  ALT Latest Ref Range: 6 - 29 U/L 18   Results for AILANI, GOVERNALE (MRN [id number]) as of 03/08/2018 10:51  Ref. Range 02/26/2018 10:53  Anti Nuclear Antibody(ANA) Latest Ref Range: NEGATIVE  POSITIVE (A)  ANA Pattern 1 Unknown SPECKLED (A)  ANA Titer 1 Latest Units: titer > OR = 1:1280 (A)  ds DNA Ab Latest Units: IU/mL <1  C3  Complement Latest Ref Range: 83 - 193 mg/dL 102  C4 Complement Latest Ref Range: 15 - 57 mg/dL 33    ROS - per HPI    OV 06/03/2018  Subjective:  Patient ID: Shannon [name], female , DOB: 11/18/1970 , age 69 y.o. , MRN: [id number] , ADDRESS: [address]  [place] [id number]   06/03/2018 -   Chief Complaint  Patient presents with   Follow-up    pt states breathing is baseline. c/o sob with exertion, non prod cough & wheezing    Follow-up interstitial lung disease due to autoimmune processes not otherwise specified. On CellCept and prednisone since April 2016. Not on Bactrim prophylaxis due to G6PD deficiency. Normal Right heart cath Nov 2016 and feb 2019.  Last high-resolution CT November 2017 and dec 2018 without progresion   HPI Shannon [name] 50 y.o. -presents  for follow-up.  Last visit September 2019.  At that time CBC showed anemia.  We repeated the hemoglobin 1 month later and was stable around 10 g%.  She tells me that overall she is stable.  Last visit she had dizziness and I told her to stop gabapentin which she did and her dizziness and fatigue have improved.  She feels she is stable but in the last 3 days she has had a dry cough but no fever or congestion or wheezing or hemoptysis or edema.  No worsening shortness of breath or chest tightness.  Other than that she feels good.  She did up walking desaturation test today and it appears similar to previous visit.       OV 05/13/2019  Subjective:  Patient ID: Shannon [name], female , DOB: June 22, 1971 , age 66 y.o. , MRN: [id number] , ADDRESS: [address] [place] [address]   05/13/2019 -   Chief Complaint  Patient presents with   Follow-up    Pt just had total left knee replacement mid October 2020. Pt said she still has problems with SOB especially with going upstairs. Pt also has occ wheezing but denies any real complaints of cough.   Follow-up interstitial lung disease due to autoimmune processes not  otherwise specified. On CellCept and prednisone since April 2016. Not on Bactrim prophylaxis due to G6PD deficiency. Normal Right heart cath Nov 2016 and feb 2019.  Last high-resolution CT November 2017 and dec 2018 without progression. Last PFT Sept 2019  HPI Shannon [name] 50 y.o. -returns for follow-up.  Last seen in September 2019 and because of the pandemic things got delayed.  She was supposed to see me in 6 months with spirometry and high-resolution CT chest for progression but this did not happen.  At this follow-up we do not have those data points as yet.  She tells me the interim she has had knee surgery and therefore she is not able to do a simple walk test with [place].  She has gained some weight.  Also in the interim she is now on Social Security disability and she is unemployed now.  She feels her shortness of breath is slightly worse particularly when walking up stairs but otherwise overall she feels stable.  I reviewed her blood work.  And it seems anemia is worse in the 9 g%.  She really denies any bleeding.  Normally she runs between 10 and 11 g%.  I noticed that she still not had a shingles vaccine.   OV 01/09/2020   Subjective:  Patient ID: Shannon [name], female , DOB: 11-23-1970, age 29 y.o. years. , MRN: [id number],  ADDRESS: [address] [place] [address] PCP  Eunice Blase, PA-C Providers : Treatment Team:  Attending Provider: Kalman Shan, MD    Follow-up interstitial lung disease due to autoimmune processes not otherwise specified. On CellCept and prednisone since April 2016. Not on Bactrim prophylaxis due to G6PD deficiency. Normal Right heart cath Nov 2016 and feb 2019.  Last high-resolution CT November 2017 and dec 2018 and dec 2020 without progression. Last PFT Sept 2019 and now June 2021  Chief Complaint  Patient presents with   Follow-up       HPI Shannon [name] 50 y.o. -returns for follow-up.  Since her last visit she continues to be stable.   She is only on CellCept.  She is also on Plaquenil.  She is not on prednisone or antifibrotic's.  Her dyspnea is stable.  Her pulmonary function test is improved and a CT chest shows stability.  The main issue is that she says over the last 3 years she has gained 30 to 40 pounds of weight.  She says that she is snoring a lot according to her husband.  Apparently the husband's also noticed apneic spells.  She says she has had the Covid vaccine but is not documented in our chart.  She is yet to have the Shingrix vaccine.       Results for LILIAS, LORENSEN (MRN [id number]) as of 02/13/2017 12:16  Ref. Range 10/09/2014 09:39 03/28/2015 11:40 10/12/2015 13:42 05/06/2016 10:38 02/13/2017 10:49 06/08/2017  12/01/2017  03/08/2018  11/24/19  FVC-Pre Latest Units: L 2.54 2.55 2.53 2.63 2.32 2.31 2.38 2.46 2.67  FVC-%Pred-Pre Latest Units: % 64 64 64 67 59 59% 61% 67% 73%   Results for JAKYAH, BRADBY (MRN [id number]) as of 02/13/2017 12:16  Ref. Range 10/09/2014 09:39 03/28/2015 11:40 10/12/2015 13:42 05/06/2016 10:38 02/13/2017 10:49 06/08/2017  12/01/2017  03/08/2018  11/24/19  DLCO unc Latest Units: ml/min/mmHg 15.03 14.50 18.36 18.31 16.35 14.21 16.38 x 18.56  DLCO unc % pred Latest Units: % 43 42 53 53 47 41 47% x 72%    ROS - per HPI Results for RAYLEI, LOSURDO (MRN [id number]) as of 01/09/2020 16:33  Ref. Range 12/21/2019 22:12  Creatinine Latest Ref Range: 0.44 - 1.00 mg/dL 6.80  Results for NATAJAH, DERDERIAN (MRN [id number]) as of 01/09/2020 16:33  Ref. Range 12/21/2019 22:12  Hemoglobin Latest Ref Range: 12.0 - 15.0 g/dL 00.3 (L)   IMPRESSION: dec 2020 compared to 2018 1. The appearance of the lungs remains compatible with interstitial lung disease, with a spectrum of findings considered indeterminate for usual interstitial pneumonia (UIP) per current ats guidelines. However, given the spectrum of findings and the stability of the findings compared to the prior study, this is once again strongly favored to  represent nonspecific interstitial pneumonia (NSIP).     Electronically Signed   By: Trudie Reed M.D.   On: 06/15/2019 09:42 ROS - per HPI    OV 07/27/2020  Subjective:  Patient ID: Shannon [name], female , DOB: Dec 29, 1970 , age 55 y.o. , MRN: [id number] , ADDRESS: [address] [place] [address] PCP Eunice Blase, PA-C Patient Care Team: Otila Back as PCP - General (Internal Medicine)  This Provider for this visit: Treatment Team:  Attending Provider: Kalman Shan, MD    07/27/2020 -   Chief Complaint  Patient presents with   Follow-up    Get pft results.   Follow-up interstitial lung disease due to autoimmune processes not otherwise specified. -Lupus per [facility] clinic notes in December 2021.   - On CellCept and prednisone since April 2016.  Off prednisone in 2021  Not on Bactrim prophylaxis due to G6PD deficiency.    - Normal Right heart cath Nov 2016 and feb 2019  -  Last high-resolution CT November 2017 and dec 2018 and dec 2020 without progression.    - Last PFT Sept 2019 and now June 2021 and feb 2022  Chief Complaint  Patient presents with   Follow-up      HPI Shannon [name] 50 y.o. -returns for routine ILD follow-up.  Last seen in July 2021.  Clinically she is stable.  Since last seeing me November 2020 when she had right knee surgery.  She uses a cane.  She is also seen Dr.  Olin in sleep clinic.  She is now on CPAP and it is helping her.  Her weight gain persists.  Her mobility has therefore gone down but from a dyspnea standpoint she is stable.  She is up-to-date with her COVID vaccine.  With the booster she did get symptomatic with Sirs response.  She continues on CellCept.  She is now seeing [facility] rheumatology.  She does not see [facility] anymore.  She believes she has lupus.  She has an appointment upcoming in a few days.  Review of the records at [facility] indicate t that she has lupus.  She continues to  be off prednisone.  She is unable to do a walk test today because of her knee issues and also she is on a cane.  She has pulmonary function test there is a 2-3.8% decline in FVC/DLCO.  This could easily be because of weight gain.  Her symptom scores are stable.  She did have CT angiogram in the summer 2021 this ruled out pulmonary embolism.  There is no comment about ILD.  But then it is a contrast CT.    OV 03/05/2021  Subjective:  Patient ID: Shannon [name], female , DOB: Mar 24, 1971 , age 38 y.o. , MRN: [id number] , ADDRESS: [address] [place] [address] PCP Eunice Blase, PA-C Patient Care Team: Otila Back as PCP - General (Internal Medicine)  This Provider for this visit: Treatment Team:  Attending Provider: Kalman Shan, MD    03/05/2021 -   Chief Complaint  Patient presents with   Follow-up    PFT performed today.  Pt states she has been doing okay since last visit and states that her breathing is about the same.   Follow-up interstitial lung disease due to autoimmune processes not otherwise specified. -Lupus per [facility] clinic notes in December 2021.   - On CellCept and prednisone since April 2016.  Off prednisone in 2021  Not on Bactrim prophylaxis due to G6PD deficiency.    - Normal Right heart cath Nov 2016 and feb 2019  -  Last high-resolution CT November 2017 and dec 2018 and dec 2020 without progression. - > July 2021 CTA   - Last PFT Sept 2019 and now June 2021 and feb 2022 and sept 2022  HPI Shannon [name] 50 y.o. -returns for follow-up.  She continues on her immunosuppressive regimen through [facility] rheumatology program.  She is happy with the care there.  She is not on prednisone.  She uses CPAP through Dr. Val Eagle.  Overall she is feeling stable.  Symptom scores are stable.  She had pulmonary function test and it shows that her FVC is stable but DLCO is declined.  This correlates with a hemoglobin around 10 last done in June 2022 at  [facility].  But then she is also chronically anemic.  She is definitely not noticing any worsening in her shortness of breath.  Her walking desaturation test is also stable.  She wants her flu shot today.       SYMPTOM SCALE - ILD 05/13/2019  07/27/2020  03/05/2021   O2 use RA ra ra  Shortness of Breath 0 -> 5 scale with 5 being worst (score 6 If unable to do)    At rest Simple tasks - showers, clothes change, eating, shaving Household (dishes, doing bed, laundry) Shopping Walking level at own pace  Walking up Stairs Total (40 - 48) Dyspnea Score How bad is your cough? 0 2 0  How bad is your fatigue 3 0 3  nausea  0 0  vomit  0 0  diarrhea  0 0  anxiety  4 4  depression  4           Simple office walk 185 feet x  3 laps goal with forehead probe 03/08/2018  06/03/2018  T.d   O2 used Room air Room iar ra  Number laps completed 3 3 x 250 feet   Comments about pace Slow pace normal nl  Resting Pulse Ox/HR 100% and 96/min 100% and 76 100% and 98/min  Final Pulse Ox/HR 100% and 112/min 96% and 121 97% and 135/min  Desaturated </= 88% n no no  Desaturated <= 3% points no Yes, 4 points Yes, 3  Got Tachycardic >/= 90/min yes yes yes  Symptoms at end of test Mild fatigue Mild dyspnea mild  Miscellaneous comments Very slow pace  Avg pace      PFT  PFT Results Latest Ref Rng & Units 03/05/2021 07/27/2020 11/24/2019 03/08/2018 12/01/2017 06/08/2017 02/13/2017  FVC-Pre L 2.64 2.59 2.67 2.46 2.38 2.31 2.32  FVC-Predicted Pre % 73 72 73 67 61 59 59  FVC-Post L - - - - - - -  FVC-Predicted Post % - - - - - - -  Pre FEV1/FVC % % 85 88 83 84 83 81 84  Post FEV1/FCV % % - - - - - - -  FEV1-Pre L 2.24 2.28 2.21 2.07 1.98 1.87 1.94  FEV1-Predicted Pre % 77 78 75 70 62 59 61  FEV1-Post L - - - - - - -  DLCO uncorrected ml/min/mmHg 15.34 19.54 18.56 - 16.38 14.21 16.35  DLCO UNC% % 60 76 72 - 47 41 47  DLCO corrected ml/min/mmHg  15.34 19.54 20.32 - - 14.89 15.02  DLCO COR %Predicted % 60 76 79 - - 43 43  DLVA Predicted % 97 117 122 - 78 78 80  TLC L - - - - - - -  TLC % Predicted % - - - - - - -  RV % Predicted % - - - - - - -       has a past medical history of Anemia, Anxiety, Depression, Dyspnea, G6PD deficiency, ILD (interstitial lung disease) (HCC), Lupus (HCC), Pneumonia, Rheumatoid arthritis (HCC), and Sleep apnea.   reports that she quit smoking about 6 years ago. Her smoking use included cigarettes. She has a 7.00 pack-year smoking history. She has never used smokeless tobacco.  Past Surgical History:  Procedure Laterality Date   ABDOMINAL HYSTERECTOMY     APPENDECTOMY  1980   cardiac catherization  08/20/2017   CARDIAC CATHETERIZATION N/A 05/10/2015   Procedure: Right Heart Cath;  Surgeon: Laurey Morale, MD;  Location: [facility] INVASIVE CV LAB;  Service: Cardiovascular;  Laterality: N/A;   CESAREAN SECTION  '95, '02, '07   X 3   CHOLECYSTECTOMY  2010   LAPAROSCOPIC HYSTERECTOMY  10/2015   have ovaries   RIGHT HEART CATH N/A 08/20/2017   Procedure: RIGHT HEART CATH;  Surgeon: Laurey Morale, MD;  Location: [facility] INVASIVE CV LAB;  Service: Cardiovascular;  Laterality: N/A;   TOTAL KNEE ARTHROPLASTY Left 04/04/2019   Procedure: TOTAL KNEE ARTHROPLASTY;  Surgeon: Ollen Gross, MD;  Location: [facility] ORS;  Service: Orthopedics;  Laterality: Left;    TOTAL KNEE ARTHROPLASTY Right 04/30/2020   Procedure: TOTAL KNEE ARTHROPLASTY;  Surgeon: Ollen Gross, MD;  Location: [facility] ORS;  Service: Orthopedics;  Laterality: Right;    TUBAL LIGATION      Allergies  Allergen Reactions   Asa [Aspirin] Hives and Shortness Of Breath    Chest tightness    Penicillins Hives    Has patient had a PCN reaction causing immediate rash, facial/tongue/throat swelling, SOB or lightheadedness with hypotension: Unknown Has patient had a PCN reaction causing severe rash involving mucus membranes or skin necrosis: Unknown Has  patient had a PCN reaction that required hospitalization: Unknown Has patient had a PCN reaction occurring within the last 10 years: No If all of the above answers are "NO", then may proceed with Cephalosporin use.  Tolerated Cephalosporin Date: 05/01/20.     Sulfa Antibiotics     G6PD deficiency     Immunization History  Administered Date(s) Administered   Influenza Split 03/23/2014   Influenza,inj,Quad PF,6+ Mos 03/15/2016, 06/08/2017, 03/08/2018, 02/17/2019, 03/05/2021   Influenza-Unspecified 04/03/2020   PFIZER Comirnaty(Gray Top)Covid-19 Tri-Sucrose Vaccine 09/13/2019, 05/30/2020   PFIZER(Purple Top)SARS-COV-2 Vaccination 10/06/2019   Pneumococcal-Unspecified 08/23/2014    Family History  Problem Relation Age of Onset   Sarcoidosis Mother    Lupus Sister    Healthy Daughter    Healthy Son    Healthy Son      Current Outpatient Medications:    albuterol (VENTOLIN HFA) 108 (90 Base) MCG/ACT inhaler, Inhale 2 puffs into the lungs every 6 (six) hours as needed for wheezing or shortness of breath. (Patient taking differently: Inhale 1 puff into the lungs every 4 (four) hours as needed for wheezing or shortness of breath.), Disp: 1 Inhaler, Rfl: 5   buPROPion (WELLBUTRIN XL) 150 MG 24 hr tablet, Take 150 mg by mouth daily. , Disp: , Rfl: 1   busPIRone (BUSPAR) 10 MG tablet, Take 10 mg by mouth daily., Disp: , Rfl:    famotidine (PEPCID) 20 MG tablet, Take 20 mg by mouth at bedtime., Disp: , Rfl:    ferrous sulfate 325 (65 FE) MG tablet, Take 650 mg by mouth daily with breakfast., Disp: , Rfl:    fluticasone furoate-vilanterol (BREO ELLIPTA) 200-25 MCG/INH AEPB, Inhale 1 puff into the lungs daily., Disp: 60 each, Rfl: 6   hydroxychloroquine (PLAQUENIL) 200 MG tablet, Take 400 mg by mouth daily. , Disp: , Rfl:    lisinopril (ZESTRIL) 10 MG tablet, Take 10 mg by mouth daily., Disp: , Rfl:    mycophenolate (CELLCEPT) 500 MG tablet, Take 2 tablets (1,000 mg total) by mouth 2 (two)  times daily., Disp: , Rfl:    Oxycodone HCl 10 MG TABS, Take 10 mg by mouth 2 (two) times daily as needed (pain). , Disp: , Rfl:    QUEtiapine (SEROQUEL) 200 MG tablet, Take 200 mg by mouth at bedtime. , Disp: , Rfl:    sertraline (ZOLOFT) 100 MG tablet, Take 150 mg by mouth at bedtime. , Disp: , Rfl: 0      Objective:   Vitals:   03/05/21 1554  BP: 120/80  Pulse: 98  SpO2: 100%  Weight: (!) 300 lb 6.4 oz (136.3 kg)  Height: 5' 9.48" (1.765 m)    Estimated body mass index is 43.75 kg/m as calculated from the following:   Height as of this encounter: 5' 9.48" (1.765 m).   Weight as of this encounter: 300 lb 6.4 oz (136.3 kg).  @WEIGHTCHANGE @  [employer]  03/05/21 1554  Weight: (!) 300 lb 6.4 oz (136.3 kg)     Physical Exam    General: No distress. obese Neuro: Alert and Oriented x 3. GCS 15. Speech normal Psych: Pleasant Resp:  Barrel Chest - no.  Wheeze - no, Crackles - yes at base, No overt respiratory distress CVS: Normal heart sounds. Murmurs - no Ext: Stigmata of Connective Tissue Disease - no HEENT: Normal upper airway. PEERL +. No post nasal drip        Assessment:       ICD-10-CM   1. Interstitial lung disease due to connective tissue disease (HCC)  J84.89    M35.9     2. Need for immunization against influenza  Z23 Flu Vaccine QUAD 94mo+IM (Fluarix, Fluzone & Alfiuria Quad PF)    3. Interstitial pulmonary disease (HCC)  J84.9 CT Chest High Resolution    Pulmonary function test         Plan:     Patient Instructions  Interstitial lung disease due to connective tissue disease (HCC) High risk medication use Immunosuppressed status  Clinically stable with symptoms and walk test. .  Pulmonary function test in Sept 2022 has FVC number stable but dlco number down which could be due to your anemia of Hgb 10gm% in June 2022 at [facility] ' -Continue CellCept through the rheumatologist at [facility] -Continue Plaquenil through the  rheumatologist at [facility] -Noted that you are off prednisone since 2021  -If pulmonary fibrosis gets worse then we can add nintedanib and consider you for research registry study called ILD-Pro -Get spirometry and DLCO in 6 months - get HRCT supine and prone in 6 months  History of snoring Witnessed apneic spells OSA    - glad you are seeing Dr Val Eagle and are on CPAP  Plan  - per Dr Val Eagle  Vaccine counseling  PLAN  - flu shot - recommend new booster mRNA vaccine   - Please talk to PCP [name], [name], PA-C -  and ensure you get  shingrix (GSK) inactivated vaccine against shingles  Follow-up -Spirometry and DLCO in 6 months - HRCT in 6 months -Return to see Dr. Marchelle Gearing 30-minute ILD slot in 6 months  - symptoms score and walk test in 6 months    SIGNATURE    Dr. Kalman Shan, M.D., F.C.C.P,  Pulmonary and Critical Care Medicine Staff Physician, [facility] Center Director - Interstitial Lung Disease  Program  Pulmonary Fibrosis [facility] at [facility] [place], [place], [address]  Pager: [phone], If no answer or between  15:00h - 7:00h: call [phone] Telephone: [phone]  5:11 PM 03/05/2021

## 2021-03-05 NOTE — Patient Instructions (Addendum)
Interstitial lung disease due to connective tissue disease (HCC) High risk medication use Immunosuppressed status  Clinically stable with symptoms and walk test. .  Pulmonary function test in Sept 2022 has FVC number stable but dlco number down which could be due to your anemia of Hgb 10gm% in June 2022 at [facility] ' -Continue CellCept through the rheumatologist at [facility] -Continue Plaquenil through the rheumatologist at [facility] -Noted that you are off prednisone since 2021  -If pulmonary fibrosis gets worse then we can add nintedanib and consider you for research registry study called ILD-Pro -Get spirometry and DLCO in 6 months - get HRCT supine and prone in 6 months  History of snoring Witnessed apneic spells OSA    - glad you are seeing Dr Val Eagle and are on CPAP  Plan  - per Dr Val Eagle  Vaccine counseling  PLAN  - flu shot - recommend new booster mRNA vaccine   - Please talk to PCP [name], [name], PA-C -  and ensure you get  shingrix (GSK) inactivated vaccine against shingles  Follow-up -Spirometry and DLCO in 6 months - HRCT in 6 months -Return to see Dr. Marchelle Gearing 30-minute ILD slot in 6 months  - symptoms score and walk test in 6 months

## 2021-03-06 DIAGNOSIS — Z79891 Long term (current) use of opiate analgesic: Secondary | ICD-10-CM | POA: Diagnosis not present

## 2021-03-06 DIAGNOSIS — Z96651 Presence of right artificial knee joint: Secondary | ICD-10-CM | POA: Diagnosis not present

## 2021-03-06 DIAGNOSIS — E669 Obesity, unspecified: Secondary | ICD-10-CM | POA: Diagnosis not present

## 2021-03-06 DIAGNOSIS — J849 Interstitial pulmonary disease, unspecified: Secondary | ICD-10-CM | POA: Diagnosis not present

## 2021-03-06 DIAGNOSIS — M1711 Unilateral primary osteoarthritis, right knee: Secondary | ICD-10-CM | POA: Diagnosis not present

## 2021-03-06 DIAGNOSIS — G894 Chronic pain syndrome: Secondary | ICD-10-CM | POA: Diagnosis not present

## 2021-03-06 DIAGNOSIS — Z96652 Presence of left artificial knee joint: Secondary | ICD-10-CM | POA: Diagnosis not present

## 2021-03-06 DIAGNOSIS — M25561 Pain in right knee: Secondary | ICD-10-CM | POA: Diagnosis not present

## 2021-03-06 DIAGNOSIS — Z1389 Encounter for screening for other disorder: Secondary | ICD-10-CM | POA: Diagnosis not present

## 2021-03-06 DIAGNOSIS — Z6841 Body Mass Index (BMI) 40.0 and over, adult: Secondary | ICD-10-CM | POA: Diagnosis not present

## 2021-03-06 DIAGNOSIS — M47816 Spondylosis without myelopathy or radiculopathy, lumbar region: Secondary | ICD-10-CM | POA: Diagnosis not present

## 2021-03-06 DIAGNOSIS — M1990 Unspecified osteoarthritis, unspecified site: Secondary | ICD-10-CM | POA: Diagnosis not present

## 2021-03-15 DIAGNOSIS — M1711 Unilateral primary osteoarthritis, right knee: Secondary | ICD-10-CM | POA: Diagnosis not present

## 2021-03-15 DIAGNOSIS — Z79891 Long term (current) use of opiate analgesic: Secondary | ICD-10-CM | POA: Diagnosis not present

## 2021-03-15 DIAGNOSIS — Z96651 Presence of right artificial knee joint: Secondary | ICD-10-CM | POA: Diagnosis not present

## 2021-03-15 DIAGNOSIS — G894 Chronic pain syndrome: Secondary | ICD-10-CM | POA: Diagnosis not present

## 2021-03-15 DIAGNOSIS — J849 Interstitial pulmonary disease, unspecified: Secondary | ICD-10-CM | POA: Diagnosis not present

## 2021-03-15 DIAGNOSIS — M25561 Pain in right knee: Secondary | ICD-10-CM | POA: Diagnosis not present

## 2021-03-15 DIAGNOSIS — Z96652 Presence of left artificial knee joint: Secondary | ICD-10-CM | POA: Diagnosis not present

## 2021-03-15 DIAGNOSIS — M329 Systemic lupus erythematosus, unspecified: Secondary | ICD-10-CM | POA: Diagnosis not present

## 2021-03-19 DIAGNOSIS — M25511 Pain in right shoulder: Secondary | ICD-10-CM | POA: Diagnosis not present

## 2021-03-26 DIAGNOSIS — G4733 Obstructive sleep apnea (adult) (pediatric): Secondary | ICD-10-CM | POA: Diagnosis not present

## 2021-03-26 DIAGNOSIS — G4710 Hypersomnia, unspecified: Secondary | ICD-10-CM | POA: Diagnosis not present

## 2021-03-28 DIAGNOSIS — G894 Chronic pain syndrome: Secondary | ICD-10-CM | POA: Diagnosis not present

## 2021-03-28 DIAGNOSIS — J849 Interstitial pulmonary disease, unspecified: Secondary | ICD-10-CM | POA: Diagnosis not present

## 2021-03-28 DIAGNOSIS — Z96651 Presence of right artificial knee joint: Secondary | ICD-10-CM | POA: Diagnosis not present

## 2021-03-28 DIAGNOSIS — Z79891 Long term (current) use of opiate analgesic: Secondary | ICD-10-CM | POA: Diagnosis not present

## 2021-03-28 DIAGNOSIS — Z96652 Presence of left artificial knee joint: Secondary | ICD-10-CM | POA: Diagnosis not present

## 2021-03-28 DIAGNOSIS — M329 Systemic lupus erythematosus, unspecified: Secondary | ICD-10-CM | POA: Diagnosis not present

## 2021-03-28 DIAGNOSIS — M25561 Pain in right knee: Secondary | ICD-10-CM | POA: Diagnosis not present

## 2021-03-28 DIAGNOSIS — M1711 Unilateral primary osteoarthritis, right knee: Secondary | ICD-10-CM | POA: Diagnosis not present

## 2021-04-03 DIAGNOSIS — M3213 Lung involvement in systemic lupus erythematosus: Secondary | ICD-10-CM | POA: Diagnosis not present

## 2021-04-03 DIAGNOSIS — Z5181 Encounter for therapeutic drug level monitoring: Secondary | ICD-10-CM | POA: Diagnosis not present

## 2021-04-03 DIAGNOSIS — Z23 Encounter for immunization: Secondary | ICD-10-CM | POA: Diagnosis not present

## 2021-04-03 DIAGNOSIS — Z79899 Other long term (current) drug therapy: Secondary | ICD-10-CM | POA: Diagnosis not present

## 2021-04-03 DIAGNOSIS — J849 Interstitial pulmonary disease, unspecified: Secondary | ICD-10-CM | POA: Diagnosis not present

## 2021-04-03 DIAGNOSIS — R69 Illness, unspecified: Secondary | ICD-10-CM | POA: Diagnosis not present

## 2021-04-03 DIAGNOSIS — M5459 Other low back pain: Secondary | ICD-10-CM | POA: Diagnosis not present

## 2021-04-03 DIAGNOSIS — M25511 Pain in right shoulder: Secondary | ICD-10-CM | POA: Diagnosis not present

## 2021-04-03 DIAGNOSIS — M545 Low back pain, unspecified: Secondary | ICD-10-CM | POA: Diagnosis not present

## 2021-04-03 DIAGNOSIS — G894 Chronic pain syndrome: Secondary | ICD-10-CM | POA: Diagnosis not present

## 2021-04-03 DIAGNOSIS — M797 Fibromyalgia: Secondary | ICD-10-CM | POA: Diagnosis not present

## 2021-04-14 DIAGNOSIS — M25561 Pain in right knee: Secondary | ICD-10-CM | POA: Diagnosis not present

## 2021-04-14 DIAGNOSIS — M1711 Unilateral primary osteoarthritis, right knee: Secondary | ICD-10-CM | POA: Diagnosis not present

## 2021-04-14 DIAGNOSIS — Z96652 Presence of left artificial knee joint: Secondary | ICD-10-CM | POA: Diagnosis not present

## 2021-04-14 DIAGNOSIS — J849 Interstitial pulmonary disease, unspecified: Secondary | ICD-10-CM | POA: Diagnosis not present

## 2021-04-14 DIAGNOSIS — Z96651 Presence of right artificial knee joint: Secondary | ICD-10-CM | POA: Diagnosis not present

## 2021-04-14 DIAGNOSIS — G894 Chronic pain syndrome: Secondary | ICD-10-CM | POA: Diagnosis not present

## 2021-04-14 DIAGNOSIS — Z79891 Long term (current) use of opiate analgesic: Secondary | ICD-10-CM | POA: Diagnosis not present

## 2021-04-14 DIAGNOSIS — M329 Systemic lupus erythematosus, unspecified: Secondary | ICD-10-CM | POA: Diagnosis not present

## 2021-04-17 DIAGNOSIS — I429 Cardiomyopathy, unspecified: Secondary | ICD-10-CM | POA: Diagnosis not present

## 2021-04-17 DIAGNOSIS — Z6841 Body Mass Index (BMI) 40.0 and over, adult: Secondary | ICD-10-CM | POA: Diagnosis not present

## 2021-04-17 DIAGNOSIS — M329 Systemic lupus erythematosus, unspecified: Secondary | ICD-10-CM | POA: Diagnosis not present

## 2021-04-17 DIAGNOSIS — G4710 Hypersomnia, unspecified: Secondary | ICD-10-CM | POA: Diagnosis not present

## 2021-04-17 DIAGNOSIS — J849 Interstitial pulmonary disease, unspecified: Secondary | ICD-10-CM | POA: Diagnosis not present

## 2021-04-17 DIAGNOSIS — J309 Allergic rhinitis, unspecified: Secondary | ICD-10-CM | POA: Diagnosis not present

## 2021-04-17 DIAGNOSIS — G4733 Obstructive sleep apnea (adult) (pediatric): Secondary | ICD-10-CM | POA: Diagnosis not present

## 2021-04-17 DIAGNOSIS — I10 Essential (primary) hypertension: Secondary | ICD-10-CM | POA: Diagnosis not present

## 2021-04-17 DIAGNOSIS — R739 Hyperglycemia, unspecified: Secondary | ICD-10-CM | POA: Diagnosis not present

## 2021-04-17 DIAGNOSIS — Z79899 Other long term (current) drug therapy: Secondary | ICD-10-CM | POA: Diagnosis not present

## 2021-04-19 DIAGNOSIS — Z79899 Other long term (current) drug therapy: Secondary | ICD-10-CM | POA: Diagnosis not present

## 2021-04-26 DIAGNOSIS — Z79891 Long term (current) use of opiate analgesic: Secondary | ICD-10-CM | POA: Diagnosis not present

## 2021-04-26 DIAGNOSIS — Z96651 Presence of right artificial knee joint: Secondary | ICD-10-CM | POA: Diagnosis not present

## 2021-04-26 DIAGNOSIS — Z96652 Presence of left artificial knee joint: Secondary | ICD-10-CM | POA: Diagnosis not present

## 2021-04-26 DIAGNOSIS — G4710 Hypersomnia, unspecified: Secondary | ICD-10-CM | POA: Diagnosis not present

## 2021-04-26 DIAGNOSIS — Z1389 Encounter for screening for other disorder: Secondary | ICD-10-CM | POA: Diagnosis not present

## 2021-04-26 DIAGNOSIS — G4733 Obstructive sleep apnea (adult) (pediatric): Secondary | ICD-10-CM | POA: Diagnosis not present

## 2021-04-26 DIAGNOSIS — M1711 Unilateral primary osteoarthritis, right knee: Secondary | ICD-10-CM | POA: Diagnosis not present

## 2021-04-26 DIAGNOSIS — E669 Obesity, unspecified: Secondary | ICD-10-CM | POA: Diagnosis not present

## 2021-04-26 DIAGNOSIS — M47816 Spondylosis without myelopathy or radiculopathy, lumbar region: Secondary | ICD-10-CM | POA: Diagnosis not present

## 2021-04-26 DIAGNOSIS — G894 Chronic pain syndrome: Secondary | ICD-10-CM | POA: Diagnosis not present

## 2021-04-26 DIAGNOSIS — M25561 Pain in right knee: Secondary | ICD-10-CM | POA: Diagnosis not present

## 2021-04-28 DIAGNOSIS — Z79891 Long term (current) use of opiate analgesic: Secondary | ICD-10-CM | POA: Diagnosis not present

## 2021-04-28 DIAGNOSIS — Z96652 Presence of left artificial knee joint: Secondary | ICD-10-CM | POA: Diagnosis not present

## 2021-04-28 DIAGNOSIS — G894 Chronic pain syndrome: Secondary | ICD-10-CM | POA: Diagnosis not present

## 2021-04-28 DIAGNOSIS — M1711 Unilateral primary osteoarthritis, right knee: Secondary | ICD-10-CM | POA: Diagnosis not present

## 2021-04-28 DIAGNOSIS — M329 Systemic lupus erythematosus, unspecified: Secondary | ICD-10-CM | POA: Diagnosis not present

## 2021-04-28 DIAGNOSIS — M25561 Pain in right knee: Secondary | ICD-10-CM | POA: Diagnosis not present

## 2021-04-28 DIAGNOSIS — J849 Interstitial pulmonary disease, unspecified: Secondary | ICD-10-CM | POA: Diagnosis not present

## 2021-04-28 DIAGNOSIS — Z96651 Presence of right artificial knee joint: Secondary | ICD-10-CM | POA: Diagnosis not present

## 2021-04-29 DIAGNOSIS — F411 Generalized anxiety disorder: Secondary | ICD-10-CM | POA: Diagnosis not present

## 2021-04-29 DIAGNOSIS — R69 Illness, unspecified: Secondary | ICD-10-CM | POA: Diagnosis not present

## 2021-04-29 DIAGNOSIS — F332 Major depressive disorder, recurrent severe without psychotic features: Secondary | ICD-10-CM | POA: Diagnosis not present

## 2021-05-07 DIAGNOSIS — M25511 Pain in right shoulder: Secondary | ICD-10-CM | POA: Diagnosis not present

## 2021-05-13 ENCOUNTER — Other Ambulatory Visit: Payer: Self-pay | Admitting: Pulmonary Disease

## 2021-05-15 DIAGNOSIS — Z96651 Presence of right artificial knee joint: Secondary | ICD-10-CM | POA: Diagnosis not present

## 2021-05-15 DIAGNOSIS — M329 Systemic lupus erythematosus, unspecified: Secondary | ICD-10-CM | POA: Diagnosis not present

## 2021-05-15 DIAGNOSIS — M25511 Pain in right shoulder: Secondary | ICD-10-CM | POA: Diagnosis not present

## 2021-05-15 DIAGNOSIS — Z79891 Long term (current) use of opiate analgesic: Secondary | ICD-10-CM | POA: Diagnosis not present

## 2021-05-15 DIAGNOSIS — M25561 Pain in right knee: Secondary | ICD-10-CM | POA: Diagnosis not present

## 2021-05-15 DIAGNOSIS — Z96652 Presence of left artificial knee joint: Secondary | ICD-10-CM | POA: Diagnosis not present

## 2021-05-15 DIAGNOSIS — G894 Chronic pain syndrome: Secondary | ICD-10-CM | POA: Diagnosis not present

## 2021-05-15 DIAGNOSIS — M1711 Unilateral primary osteoarthritis, right knee: Secondary | ICD-10-CM | POA: Diagnosis not present

## 2021-05-15 DIAGNOSIS — J849 Interstitial pulmonary disease, unspecified: Secondary | ICD-10-CM | POA: Diagnosis not present

## 2021-05-24 DIAGNOSIS — E669 Obesity, unspecified: Secondary | ICD-10-CM | POA: Diagnosis not present

## 2021-05-24 DIAGNOSIS — M25561 Pain in right knee: Secondary | ICD-10-CM | POA: Diagnosis not present

## 2021-05-24 DIAGNOSIS — Z96652 Presence of left artificial knee joint: Secondary | ICD-10-CM | POA: Diagnosis not present

## 2021-05-24 DIAGNOSIS — Z1389 Encounter for screening for other disorder: Secondary | ICD-10-CM | POA: Diagnosis not present

## 2021-05-24 DIAGNOSIS — Z79891 Long term (current) use of opiate analgesic: Secondary | ICD-10-CM | POA: Diagnosis not present

## 2021-05-24 DIAGNOSIS — M1711 Unilateral primary osteoarthritis, right knee: Secondary | ICD-10-CM | POA: Diagnosis not present

## 2021-05-24 DIAGNOSIS — M47816 Spondylosis without myelopathy or radiculopathy, lumbar region: Secondary | ICD-10-CM | POA: Diagnosis not present

## 2021-05-24 DIAGNOSIS — Z96651 Presence of right artificial knee joint: Secondary | ICD-10-CM | POA: Diagnosis not present

## 2021-05-24 DIAGNOSIS — G894 Chronic pain syndrome: Secondary | ICD-10-CM | POA: Diagnosis not present

## 2021-05-28 DIAGNOSIS — M25561 Pain in right knee: Secondary | ICD-10-CM | POA: Diagnosis not present

## 2021-05-28 DIAGNOSIS — Z96652 Presence of left artificial knee joint: Secondary | ICD-10-CM | POA: Diagnosis not present

## 2021-05-28 DIAGNOSIS — Z96651 Presence of right artificial knee joint: Secondary | ICD-10-CM | POA: Diagnosis not present

## 2021-05-28 DIAGNOSIS — M329 Systemic lupus erythematosus, unspecified: Secondary | ICD-10-CM | POA: Diagnosis not present

## 2021-05-28 DIAGNOSIS — J849 Interstitial pulmonary disease, unspecified: Secondary | ICD-10-CM | POA: Diagnosis not present

## 2021-05-28 DIAGNOSIS — M1711 Unilateral primary osteoarthritis, right knee: Secondary | ICD-10-CM | POA: Diagnosis not present

## 2021-05-28 DIAGNOSIS — Z79891 Long term (current) use of opiate analgesic: Secondary | ICD-10-CM | POA: Diagnosis not present

## 2021-05-28 DIAGNOSIS — G894 Chronic pain syndrome: Secondary | ICD-10-CM | POA: Diagnosis not present

## 2021-06-05 DIAGNOSIS — M25511 Pain in right shoulder: Secondary | ICD-10-CM | POA: Diagnosis not present

## 2021-06-07 DIAGNOSIS — M25511 Pain in right shoulder: Secondary | ICD-10-CM | POA: Diagnosis not present

## 2021-06-07 DIAGNOSIS — M5412 Radiculopathy, cervical region: Secondary | ICD-10-CM | POA: Diagnosis not present

## 2021-06-14 DIAGNOSIS — Z79891 Long term (current) use of opiate analgesic: Secondary | ICD-10-CM | POA: Diagnosis not present

## 2021-06-14 DIAGNOSIS — M329 Systemic lupus erythematosus, unspecified: Secondary | ICD-10-CM | POA: Diagnosis not present

## 2021-06-14 DIAGNOSIS — M25561 Pain in right knee: Secondary | ICD-10-CM | POA: Diagnosis not present

## 2021-06-14 DIAGNOSIS — G894 Chronic pain syndrome: Secondary | ICD-10-CM | POA: Diagnosis not present

## 2021-06-14 DIAGNOSIS — M1711 Unilateral primary osteoarthritis, right knee: Secondary | ICD-10-CM | POA: Diagnosis not present

## 2021-06-14 DIAGNOSIS — Z96651 Presence of right artificial knee joint: Secondary | ICD-10-CM | POA: Diagnosis not present

## 2021-06-14 DIAGNOSIS — J849 Interstitial pulmonary disease, unspecified: Secondary | ICD-10-CM | POA: Diagnosis not present

## 2021-06-14 DIAGNOSIS — Z96652 Presence of left artificial knee joint: Secondary | ICD-10-CM | POA: Diagnosis not present

## 2021-06-20 DIAGNOSIS — M25511 Pain in right shoulder: Secondary | ICD-10-CM | POA: Diagnosis not present

## 2021-06-20 DIAGNOSIS — R69 Illness, unspecified: Secondary | ICD-10-CM | POA: Diagnosis not present

## 2021-06-20 DIAGNOSIS — F411 Generalized anxiety disorder: Secondary | ICD-10-CM | POA: Diagnosis not present

## 2021-06-20 DIAGNOSIS — F332 Major depressive disorder, recurrent severe without psychotic features: Secondary | ICD-10-CM | POA: Diagnosis not present

## 2021-06-20 DIAGNOSIS — M5412 Radiculopathy, cervical region: Secondary | ICD-10-CM | POA: Diagnosis not present

## 2021-06-28 DIAGNOSIS — M47816 Spondylosis without myelopathy or radiculopathy, lumbar region: Secondary | ICD-10-CM | POA: Diagnosis not present

## 2021-06-28 DIAGNOSIS — G894 Chronic pain syndrome: Secondary | ICD-10-CM | POA: Diagnosis not present

## 2021-06-28 DIAGNOSIS — M25561 Pain in right knee: Secondary | ICD-10-CM | POA: Diagnosis not present

## 2021-06-28 DIAGNOSIS — J849 Interstitial pulmonary disease, unspecified: Secondary | ICD-10-CM | POA: Diagnosis not present

## 2021-06-28 DIAGNOSIS — M1711 Unilateral primary osteoarthritis, right knee: Secondary | ICD-10-CM | POA: Diagnosis not present

## 2021-06-28 DIAGNOSIS — Z96652 Presence of left artificial knee joint: Secondary | ICD-10-CM | POA: Diagnosis not present

## 2021-06-28 DIAGNOSIS — M329 Systemic lupus erythematosus, unspecified: Secondary | ICD-10-CM | POA: Diagnosis not present

## 2021-06-28 DIAGNOSIS — Z79891 Long term (current) use of opiate analgesic: Secondary | ICD-10-CM | POA: Diagnosis not present

## 2021-06-28 DIAGNOSIS — Z96651 Presence of right artificial knee joint: Secondary | ICD-10-CM | POA: Diagnosis not present

## 2021-06-28 DIAGNOSIS — Z1389 Encounter for screening for other disorder: Secondary | ICD-10-CM | POA: Diagnosis not present

## 2021-06-28 DIAGNOSIS — E669 Obesity, unspecified: Secondary | ICD-10-CM | POA: Diagnosis not present

## 2021-07-01 DIAGNOSIS — M25511 Pain in right shoulder: Secondary | ICD-10-CM | POA: Diagnosis not present

## 2021-07-01 DIAGNOSIS — M5412 Radiculopathy, cervical region: Secondary | ICD-10-CM | POA: Diagnosis not present

## 2021-07-02 DIAGNOSIS — Z6841 Body Mass Index (BMI) 40.0 and over, adult: Secondary | ICD-10-CM | POA: Diagnosis not present

## 2021-07-02 DIAGNOSIS — L0390 Cellulitis, unspecified: Secondary | ICD-10-CM | POA: Diagnosis not present

## 2021-07-04 DIAGNOSIS — M25511 Pain in right shoulder: Secondary | ICD-10-CM | POA: Diagnosis not present

## 2021-07-04 DIAGNOSIS — M5412 Radiculopathy, cervical region: Secondary | ICD-10-CM | POA: Diagnosis not present

## 2021-07-09 DIAGNOSIS — M25511 Pain in right shoulder: Secondary | ICD-10-CM | POA: Diagnosis not present

## 2021-07-09 DIAGNOSIS — M5412 Radiculopathy, cervical region: Secondary | ICD-10-CM | POA: Diagnosis not present

## 2021-07-11 DIAGNOSIS — M25511 Pain in right shoulder: Secondary | ICD-10-CM | POA: Diagnosis not present

## 2021-07-11 DIAGNOSIS — M5412 Radiculopathy, cervical region: Secondary | ICD-10-CM | POA: Diagnosis not present

## 2021-07-15 DIAGNOSIS — M1711 Unilateral primary osteoarthritis, right knee: Secondary | ICD-10-CM | POA: Diagnosis not present

## 2021-07-15 DIAGNOSIS — M25561 Pain in right knee: Secondary | ICD-10-CM | POA: Diagnosis not present

## 2021-07-15 DIAGNOSIS — Z96652 Presence of left artificial knee joint: Secondary | ICD-10-CM | POA: Diagnosis not present

## 2021-07-15 DIAGNOSIS — J849 Interstitial pulmonary disease, unspecified: Secondary | ICD-10-CM | POA: Diagnosis not present

## 2021-07-15 DIAGNOSIS — Z79891 Long term (current) use of opiate analgesic: Secondary | ICD-10-CM | POA: Diagnosis not present

## 2021-07-15 DIAGNOSIS — G894 Chronic pain syndrome: Secondary | ICD-10-CM | POA: Diagnosis not present

## 2021-07-15 DIAGNOSIS — M25511 Pain in right shoulder: Secondary | ICD-10-CM | POA: Diagnosis not present

## 2021-07-15 DIAGNOSIS — M329 Systemic lupus erythematosus, unspecified: Secondary | ICD-10-CM | POA: Diagnosis not present

## 2021-07-15 DIAGNOSIS — Z96651 Presence of right artificial knee joint: Secondary | ICD-10-CM | POA: Diagnosis not present

## 2021-07-15 DIAGNOSIS — M5412 Radiculopathy, cervical region: Secondary | ICD-10-CM | POA: Diagnosis not present

## 2021-07-16 DIAGNOSIS — M25511 Pain in right shoulder: Secondary | ICD-10-CM | POA: Diagnosis not present

## 2021-07-22 DIAGNOSIS — Z96651 Presence of right artificial knee joint: Secondary | ICD-10-CM | POA: Diagnosis not present

## 2021-07-22 DIAGNOSIS — Z1389 Encounter for screening for other disorder: Secondary | ICD-10-CM | POA: Diagnosis not present

## 2021-07-22 DIAGNOSIS — Z96652 Presence of left artificial knee joint: Secondary | ICD-10-CM | POA: Diagnosis not present

## 2021-07-22 DIAGNOSIS — M25561 Pain in right knee: Secondary | ICD-10-CM | POA: Diagnosis not present

## 2021-07-22 DIAGNOSIS — Z79891 Long term (current) use of opiate analgesic: Secondary | ICD-10-CM | POA: Diagnosis not present

## 2021-07-22 DIAGNOSIS — M47816 Spondylosis without myelopathy or radiculopathy, lumbar region: Secondary | ICD-10-CM | POA: Diagnosis not present

## 2021-07-22 DIAGNOSIS — E669 Obesity, unspecified: Secondary | ICD-10-CM | POA: Diagnosis not present

## 2021-07-22 DIAGNOSIS — G894 Chronic pain syndrome: Secondary | ICD-10-CM | POA: Diagnosis not present

## 2021-07-22 DIAGNOSIS — M1711 Unilateral primary osteoarthritis, right knee: Secondary | ICD-10-CM | POA: Diagnosis not present

## 2021-07-29 DIAGNOSIS — Z79891 Long term (current) use of opiate analgesic: Secondary | ICD-10-CM | POA: Diagnosis not present

## 2021-07-29 DIAGNOSIS — M329 Systemic lupus erythematosus, unspecified: Secondary | ICD-10-CM | POA: Diagnosis not present

## 2021-07-29 DIAGNOSIS — M25561 Pain in right knee: Secondary | ICD-10-CM | POA: Diagnosis not present

## 2021-07-29 DIAGNOSIS — G894 Chronic pain syndrome: Secondary | ICD-10-CM | POA: Diagnosis not present

## 2021-07-29 DIAGNOSIS — M1711 Unilateral primary osteoarthritis, right knee: Secondary | ICD-10-CM | POA: Diagnosis not present

## 2021-07-29 DIAGNOSIS — J849 Interstitial pulmonary disease, unspecified: Secondary | ICD-10-CM | POA: Diagnosis not present

## 2021-07-29 DIAGNOSIS — Z96652 Presence of left artificial knee joint: Secondary | ICD-10-CM | POA: Diagnosis not present

## 2021-07-29 DIAGNOSIS — Z96651 Presence of right artificial knee joint: Secondary | ICD-10-CM | POA: Diagnosis not present

## 2021-07-30 DIAGNOSIS — Z79899 Other long term (current) drug therapy: Secondary | ICD-10-CM | POA: Diagnosis not present

## 2021-07-30 DIAGNOSIS — R739 Hyperglycemia, unspecified: Secondary | ICD-10-CM | POA: Diagnosis not present

## 2021-07-30 DIAGNOSIS — J849 Interstitial pulmonary disease, unspecified: Secondary | ICD-10-CM | POA: Diagnosis not present

## 2021-07-30 DIAGNOSIS — I10 Essential (primary) hypertension: Secondary | ICD-10-CM | POA: Diagnosis not present

## 2021-07-30 DIAGNOSIS — Z6841 Body Mass Index (BMI) 40.0 and over, adult: Secondary | ICD-10-CM | POA: Diagnosis not present

## 2021-08-02 IMAGING — CR DG CHEST 2V
2 series · 2 of 2 positions shown · non-contrast
Comparison: 02/13/2017

CLINICAL DATA: Cough

EXAM:
CHEST - 2 VIEW

[w chest pa]
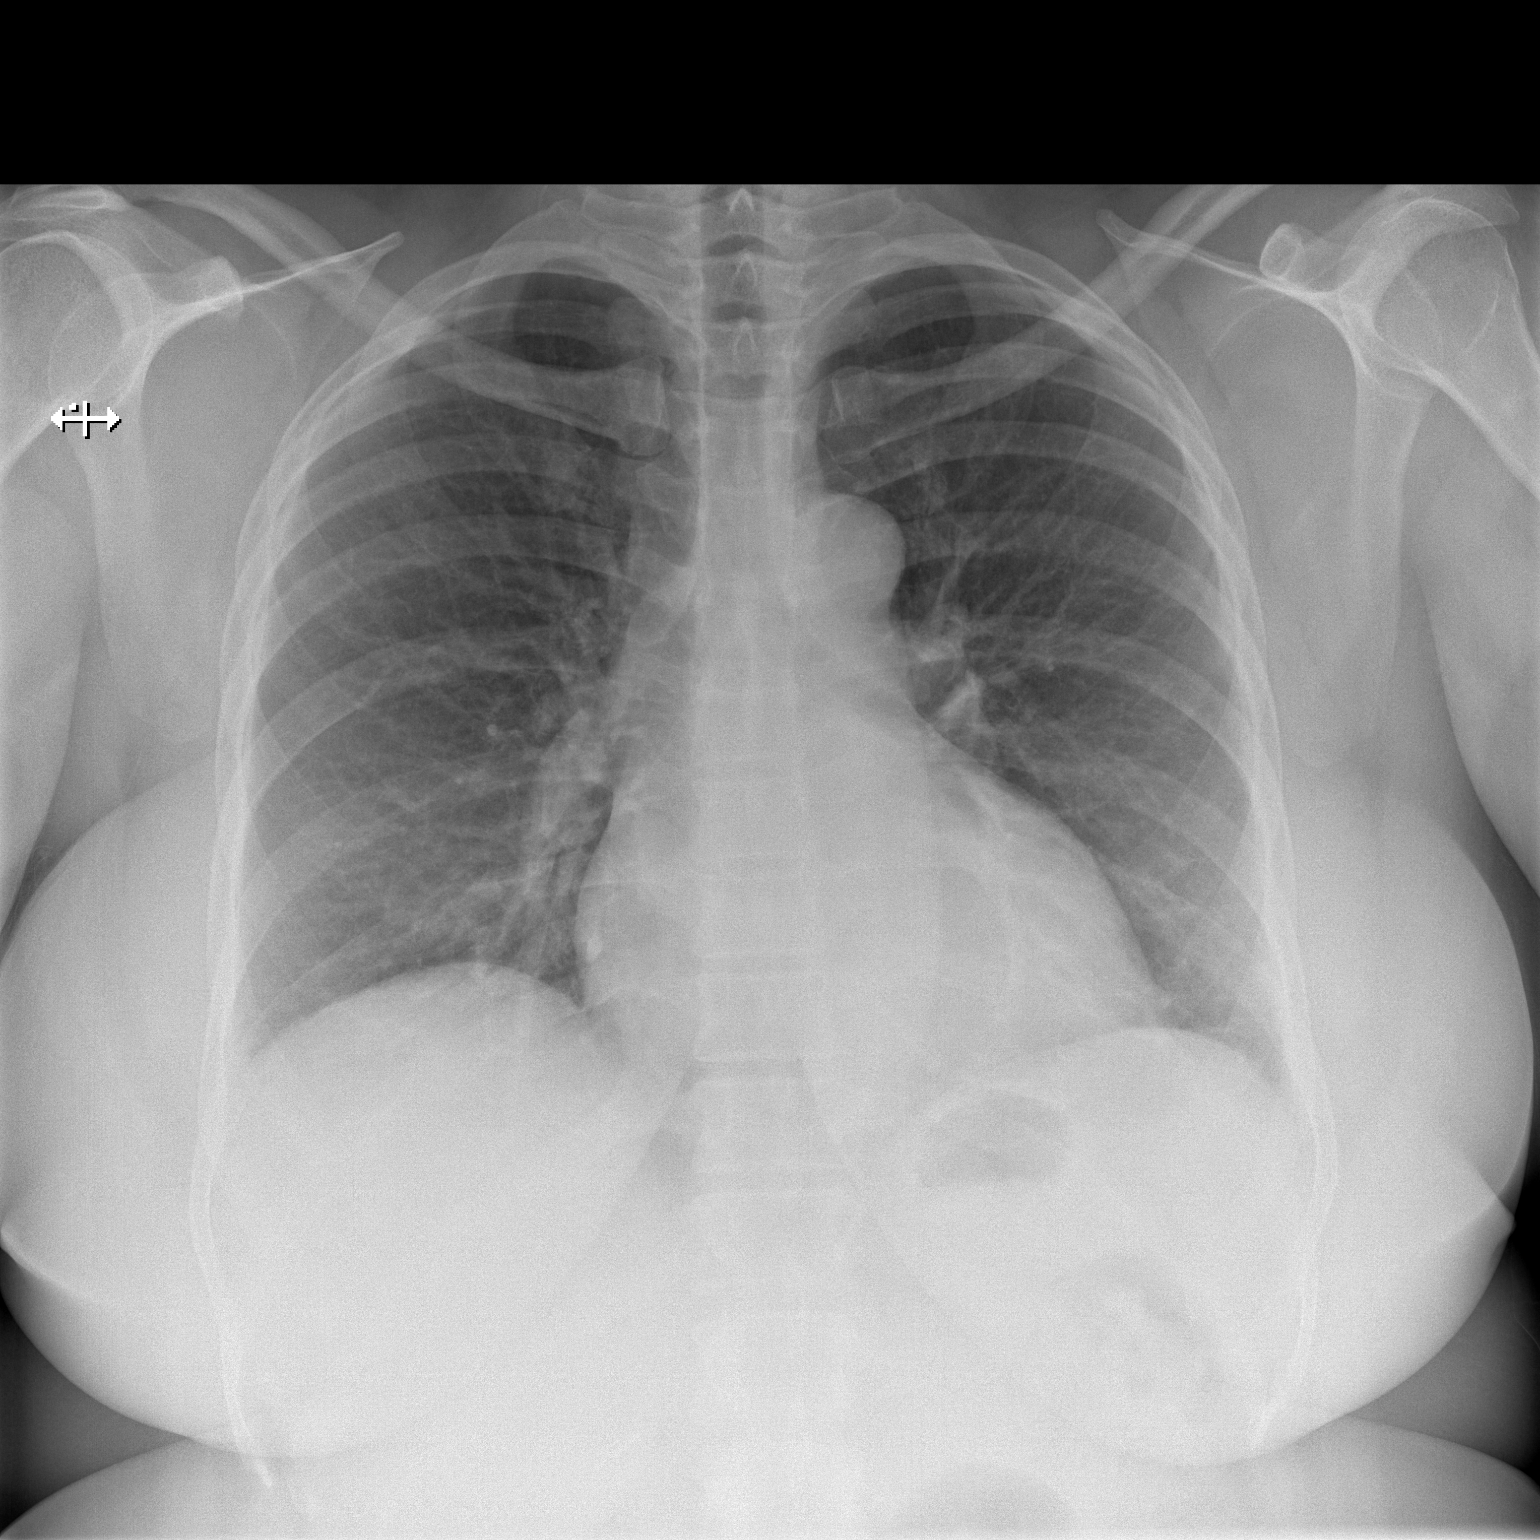

[w chest lat]
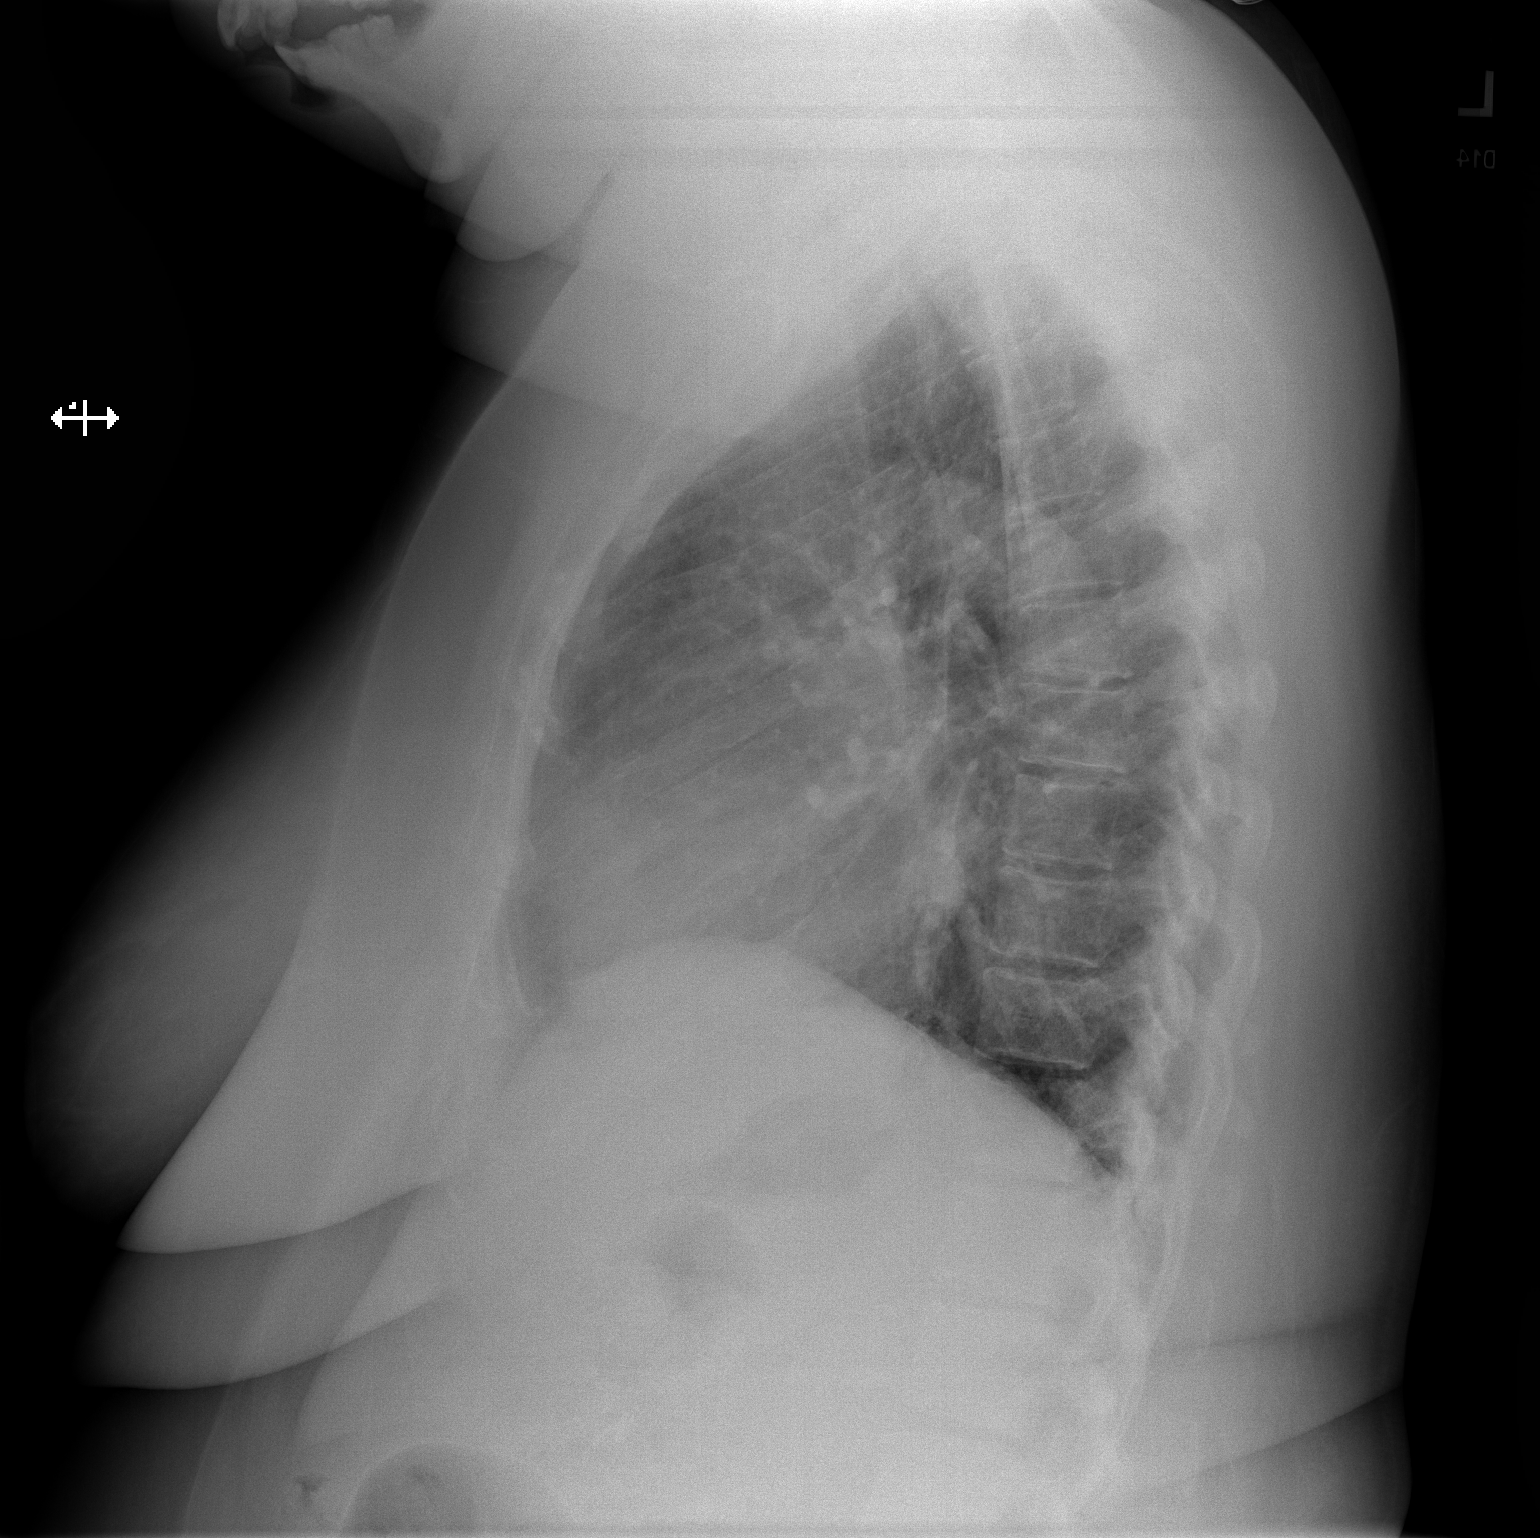

[2 of 2 positions shown; findings below may reference images not displayed]

FINDINGS: The heart size and mediastinal contours are within normal limits.
Both lungs are clear. The visualized skeletal structures are
unremarkable.
IMPRESSION: Normal study.

## 2021-08-07 DIAGNOSIS — Z006 Encounter for examination for normal comparison and control in clinical research program: Secondary | ICD-10-CM | POA: Diagnosis not present

## 2021-08-07 DIAGNOSIS — M3213 Lung involvement in systemic lupus erythematosus: Secondary | ICD-10-CM | POA: Diagnosis not present

## 2021-08-07 DIAGNOSIS — J849 Interstitial pulmonary disease, unspecified: Secondary | ICD-10-CM | POA: Diagnosis not present

## 2021-08-07 DIAGNOSIS — D849 Immunodeficiency, unspecified: Secondary | ICD-10-CM | POA: Diagnosis not present

## 2021-08-12 ENCOUNTER — Ambulatory Visit: Payer: Self-pay | Admitting: Orthopedic Surgery

## 2021-08-12 DIAGNOSIS — M7541 Impingement syndrome of right shoulder: Secondary | ICD-10-CM

## 2021-08-12 NOTE — H&P (Signed)
Shannon [name] is an 51 y.o. female.   Chief Complaint: right shoulder pain HPI: Reason for Visit: right shoulder Context: The patient is 6 1/2 months out Location (Upper Extremity): shoulder pain on the right Severity: pain level 6/10 Timing: constant Medications: The patient is taking ibuprofen and oxycodone  Past Medical History:  Diagnosis Date   Anemia    Anxiety    Depression    Dyspnea    Sometimes at rest   G6PD deficiency    ILD (interstitial lung disease) (HCC)    Lupus (HCC)    Pneumonia    Rheumatoid arthritis (HCC)    Sleep apnea     Past Surgical History:  Procedure Laterality Date   ABDOMINAL HYSTERECTOMY     APPENDECTOMY  1980   cardiac catherization  08/20/2017   CARDIAC CATHETERIZATION N/A 05/10/2015   Procedure: Right Heart Cath;  Surgeon: Laurey Moralealton S McLean, MD;  [facility] LAB;  Service: Cardiovascular;  Laterality: N/A;   CESAREAN SECTION  '95, '02, '07   X 3   CHOLECYSTECTOMY  2010   LAPAROSCOPIC HYSTERECTOMY  10/2015   have ovaries   RIGHT HEART CATH N/A 08/20/2017   Procedure: RIGHT HEART CATH;  Surgeon: Laurey MoraleMcLean, Dalton S, MD;  [facility];  Service: Cardiovascular;  Laterality: N/A;   TOTAL KNEE ARTHROPLASTY Left 04/04/2019   Procedure: TOTAL KNEE ARTHROPLASTY;  Surgeon: Ollen GrossAluisio, Frank, MD;  Location: [facility] ORS;  Service: Orthopedics;  Laterality: Left;  50min   TOTAL KNEE ARTHROPLASTY Right 04/30/2020   Procedure: TOTAL KNEE ARTHROPLASTY;  Surgeon: Ollen GrossAluisio, Frank, MD;  Location: [facility] ORS;  Service: Orthopedics;  Laterality: Right;  50min   TUBAL LIGATION      Family History  Problem Relation Age of Onset   Sarcoidosis Mother    Lupus Sister    Healthy Daughter    Healthy Son    Healthy Son    Social History:  reports that she quit smoking about 6 years ago. Her smoking use included cigarettes. She has a 7.00 pack-year smoking history. She has never used smokeless tobacco. She reports current alcohol use. She reports  that she does not use drugs.  Allergies:  Allergies  Allergen Reactions   Asa [Aspirin] Hives and Shortness Of Breath    Chest tightness    Penicillins Hives    Has patient had a PCN reaction causing immediate rash, facial/tongue/throat swelling, SOB or lightheadedness with hypotension: Unknown Has patient had a PCN reaction causing severe rash involving mucus membranes or skin necrosis: Unknown Has patient had a PCN reaction that required hospitalization: Unknown Has patient had a PCN reaction occurring within the last 10 years: No If all of the above answers are "NO", then may proceed with Cephalosporin use.  Tolerated Cephalosporin Date: 05/01/20.     Sulfa Antibiotics     G6PD deficiency     Current meds: acetaminophen 500 mg tablet albuterol sulfate HFA 90 mcg/actuation aerosol inhaler Breo Ellipta 200 mcg-25 mcg/dose powder for inhalation buPROPion HCL XL 300 mg 24 hr tablet, extended release busPIRone 15 mg tablet carvediloL 6.25 mg tablet ciprofloxacin 250 mg tablet clindamycin HCL 300 mg capsule fluticasone propionate 50 mcg/actuation nasal spray,suspension hydrOXYchloroQUINE 200 mg tablet ibuprofen 600 mg tablet lisinopriL 10 mg tablet magnesium oxide 400 mg (241.3 mg magnesium) tablet montelukast 10 mg tablet mycophenolate mofetiL 500 mg tablet oxyCODONE 10 mg tablet QUEtiapine ER 300 mg tablet,extended release 24 hr sertraline 100 mg tablet   Review of Systems  Constitutional: Negative.   °HENT: Negative.    °Eyes: Negative.   °Respiratory: Negative.    °Cardiovascular: Negative.   °Gastrointestinal: Negative.   °Endocrine: Negative.   °Genitourinary: Negative.   °Musculoskeletal:  Positive for arthralgias and myalgias.  °Skin: Negative.   °Neurological: Negative.   °Psychiatric/Behavioral: Negative.    ° °Last menstrual period 04/28/2015. °Physical Exam °Constitutional:   °   Appearance: Normal appearance.  °HENT:  °   Head: Normocephalic and atraumatic.  °    Right Ear: External ear normal.  °   Left Ear: External ear normal.  °   Nose: Nose normal.  °   Mouth/Throat:  °   Pharynx: Oropharynx is clear.  °Eyes:  °   Conjunctiva/sclera: Conjunctivae normal.  °Cardiovascular:  °   Rate and Rhythm: Normal rate and regular rhythm.  °   Pulses: Normal pulses.  °Pulmonary:  °   Effort: Pulmonary effort is normal.  °   Breath sounds: Normal breath sounds.  °Abdominal:  °   General: Bowel sounds are normal.  °Musculoskeletal:  °   Cervical back: Normal range of motion.  °   Comments: Constitutional: General Appearance: healthy-appearing and NAD. ° °Psychiatric: Mood and Affect: normal mood and affect. ° °Cardiovascular System: Arterial Pulses Right: radial normal and brachial normal. Varicosities Right: no varicosities. ° °C-Spine/Neck: Active Range of Motion: flexion normal, extension normal, and no pain elicited on motion. ° °Shoulders: Inspection Right: no misalignment, atrophy, erythema, swelling, or scapular winging. Bony Palpation Right: no tenderness of the sternoclavicular joint, the coracoid process, the acromioclavicular joint, the bicipital groove, or the scapula. Soft Tissue Palpation Right: tenderness of the supraspinatus and the subacromial bursa. Active Range of Motion Right: limited. Special Tests Right: Speed's test negative and Neer's test positive. Stability Right: no laxity, sulcus sign negative, and anterior apprehension test negative. Strength Right: abduction 5/5, adduction 5/5, flexion 5/5, and extension 5/5. ° °Skin: Right Upper Extremity: normal. ° °Neurological System: Biceps Reflex Right: normal (2). Brachioradialis Reflex Right: normal (2). Triceps Reflex Right: normal (2). Sensation on the Right: C5 normal, C6 normal, and C7 normal.  °Neurological:  °   Mental Status: She is alert.  °  °MRI indicating subacromial bursitis. No rotator cuff tear. Mild AC arthrosis. 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 Either living with her symptoms or proceeding with a shoulder arthroscopy and subacromial decompression °She would like to proceed with that. No history of DVT or MRSA. ° °I had an extensive discussion with the patient concerning her pathology and relevant anatomy. To the persistence of their symptoms despite conservative treatment to include an exercise program, activity modification and injections we discussed proceeding with a shoulder arthroscopy. Discussed the procedure in detail including the risks and benefits of improvement in symptoms, worsening of their symptoms, no changes in her symptoms. I discussed decompressing the subacromial space by a bursectomy and CA ligament release as well as acromioplasty if needed. Additionally discussed evaluating the labrum and biceps if appropriate. In addition I discussed the possibility that if an occult tear of the rotator cuff is noted that it may require a mini open rotator cuff repair. In addition discussed the outpatient procedure. Follow-up in 2 weeks. Pendulum exercises and appropriate postoperative analgesics. ° °Plan Right shoulder scope, SAD, debridement ° °Natallia Stellmach M Asiya Cutbirth,   PA-C for Dr Shelle Iron 08/12/2021, 8:24 AM

## 2021-08-12 NOTE — H&P (View-Only) (Signed)
Shannon [name] is an 51 y.o. female.   Chief Complaint: right shoulder pain HPI: Reason for Visit: right shoulder Context: The patient is 6 1/2 months out Location (Upper Extremity): shoulder pain on the right Severity: pain level 6/10 Timing: constant Medications: The patient is taking ibuprofen and oxycodone  Past Medical History:  Diagnosis Date   Anemia    Anxiety    Depression    Dyspnea    Sometimes at rest   G6PD deficiency    ILD (interstitial lung disease) (HCC)    Lupus (HCC)    Pneumonia    Rheumatoid arthritis (HCC)    Sleep apnea     Past Surgical History:  Procedure Laterality Date   ABDOMINAL HYSTERECTOMY     APPENDECTOMY  1980   cardiac catherization  08/20/2017   CARDIAC CATHETERIZATION N/A 05/10/2015   Procedure: Right Heart Cath;  Surgeon: Laurey Moralealton S McLean, MD;  [facility];  Service: Cardiovascular;  Laterality: N/A;   CESAREAN SECTION  '95, '02, '07   X 3   CHOLECYSTECTOMY  2010   LAPAROSCOPIC HYSTERECTOMY  10/2015   have ovaries   RIGHT HEART CATH N/A 08/20/2017   Procedure: RIGHT HEART CATH;  Surgeon: Laurey MoraleMcLean, Dalton S, MD;  [facility];  Service: Cardiovascular;  Laterality: N/A;   TOTAL KNEE ARTHROPLASTY Left 04/04/2019   Procedure: TOTAL KNEE ARTHROPLASTY;  Surgeon: Ollen GrossAluisio, Frank, MD;  Location: [facility];  Service: Orthopedics;  Laterality: Left;  50min   TOTAL KNEE ARTHROPLASTY Right 04/30/2020   Procedure: TOTAL KNEE ARTHROPLASTY;  Surgeon: Ollen GrossAluisio, Frank, MD;  Location: [facility];  Service: Orthopedics;  Laterality: Right;  50min   TUBAL LIGATION      Family History  Problem Relation Age of Onset   Sarcoidosis Mother    Lupus Sister    Healthy Daughter    Healthy Son    Healthy Son    Social History:  reports that she quit smoking about 6 years ago. Her smoking use included cigarettes. She has a 7.00 pack-year smoking history. She has never used smokeless tobacco. She reports current alcohol use. She reports  that she does not use drugs.  Allergies:  Allergies  Allergen Reactions   Asa [Aspirin] Hives and Shortness Of Breath    Chest tightness    Penicillins Hives    Has patient had a PCN reaction causing immediate rash, facial/tongue/throat swelling, SOB or lightheadedness with hypotension: Unknown Has patient had a PCN reaction causing severe rash involving mucus membranes or skin necrosis: Unknown Has patient had a PCN reaction that required hospitalization: Unknown Has patient had a PCN reaction occurring within the last 10 years: No If all of the above answers are "NO", then may proceed with Cephalosporin use.  Tolerated Cephalosporin Date: 05/01/20.     Sulfa Antibiotics     G6PD deficiency     Current meds: acetaminophen 500 mg tablet albuterol sulfate HFA 90 mcg/actuation aerosol inhaler Breo Ellipta 200 mcg-25 mcg/dose powder for inhalation buPROPion HCL XL 300 mg 24 hr tablet, extended release busPIRone 15 mg tablet carvediloL 6.25 mg tablet ciprofloxacin 250 mg tablet clindamycin HCL 300 mg capsule fluticasone propionate 50 mcg/actuation nasal spray,suspension hydrOXYchloroQUINE 200 mg tablet ibuprofen 600 mg tablet lisinopriL 10 mg tablet magnesium oxide 400 mg (241.3 mg magnesium) tablet montelukast 10 mg tablet mycophenolate mofetiL 500 mg tablet oxyCODONE 10 mg tablet QUEtiapine ER 300 mg tablet,extended release 24 hr sertraline 100 mg tablet   Review of Systems  Constitutional: Negative.   HENT: Negative.    Eyes: Negative.   Respiratory: Negative.    Cardiovascular: Negative.   Gastrointestinal: Negative.   Endocrine: Negative.   Genitourinary: Negative.   Musculoskeletal:  Positive for arthralgias and myalgias.  Skin: Negative.   Neurological: Negative.   Psychiatric/Behavioral: Negative.     Last menstrual period 04/28/2015. Physical Exam Constitutional:      Appearance: Normal appearance.  HENT:     Head: Normocephalic and atraumatic.      Right Ear: External ear normal.     Left Ear: External ear normal.     Nose: Nose normal.     Mouth/Throat:     Pharynx: Oropharynx is clear.  Eyes:     Conjunctiva/sclera: Conjunctivae normal.  Cardiovascular:     Rate and Rhythm: Normal rate and regular rhythm.     Pulses: Normal pulses.  Pulmonary:     Effort: Pulmonary effort is normal.     Breath sounds: Normal breath sounds.  Abdominal:     General: Bowel sounds are normal.  Musculoskeletal:     Cervical back: Normal range of motion.     Comments: Constitutional: General Appearance: healthy-appearing and NAD.  Psychiatric: Mood and Affect: normal mood and affect.  Cardiovascular System: Arterial Pulses Right: radial normal and brachial normal. Varicosities Right: no varicosities.  C-Spine/Neck: Active Range of Motion: flexion normal, extension normal, and no pain elicited on motion.  Shoulders: Inspection Right: no misalignment, atrophy, erythema, swelling, or scapular winging. Bony Palpation Right: no tenderness of the sternoclavicular joint, the coracoid process, the acromioclavicular joint, the bicipital groove, or the scapula. Soft Tissue Palpation Right: tenderness of the supraspinatus and the subacromial bursa. Active Range of Motion Right: limited. Special Tests Right: Speed's test negative and Neer's test positive. Stability Right: no laxity, sulcus sign negative, and anterior apprehension test negative. Strength Right: abduction 5/5, adduction 5/5, flexion 5/5, and extension 5/5.  Skin: Right Upper Extremity: normal.  Neurological System: Biceps Reflex Right: normal (2). Brachioradialis Reflex Right: normal (2). Triceps Reflex Right: normal (2). Sensation on the Right: C5 normal, C6 normal, and C7 normal.  Neurological:     Mental Status: She is alert.    MRI indicating subacromial bursitis. No rotator cuff tear. Mild AC arthrosis.  Assessment/Plan Impression:  Refractory impingement syndrome of the right  shoulder  Plan:  We discussed options at this point time failing conservative treatment that included subacromial injections with temporary relief as well as physical therapy and activity modification. Either living with her symptoms or proceeding with a shoulder arthroscopy and subacromial decompression She would like to proceed with that. No history of DVT or MRSA.  I had an extensive discussion with the patient concerning her pathology and relevant anatomy. To the persistence of their symptoms despite conservative treatment to include an exercise program, activity modification and injections we discussed proceeding with a shoulder arthroscopy. Discussed the procedure in detail including the risks and benefits of improvement in symptoms, worsening of their symptoms, no changes in her symptoms. I discussed decompressing the subacromial space by a bursectomy and CA ligament release as well as acromioplasty if needed. Additionally discussed evaluating the labrum and biceps if appropriate. In addition I discussed the possibility that if an occult tear of the rotator cuff is noted that it may require a mini open rotator cuff repair. In addition discussed the outpatient procedure. Follow-up in 2 weeks. Pendulum exercises and appropriate postoperative analgesics.  Plan Right shoulder scope, SAD, debridement  Dorothy Spark,  PA-C for Dr Shelle Iron 08/12/2021, 8:24 AM

## 2021-08-12 NOTE — Progress Notes (Addendum)
COVID swab appointment: N/A  COVID Vaccine Completed: Yes x2 Date COVID Vaccine completed: 09-13-19 10-06-19 Has received booster:  Yes x2 05-30-20  COVID vaccine manufacturer: Pfizer      Date of COVID positive in last 90 days:  No  PCP - Eunice Blase, PA-C (office note requested) Cardiologist - Marga Hoots, PA Pulmonologist - Kalman Shan, MD  Chest x-ray - N/A EKG - 09-03-20 on chart Stress Test - greater than 2 years Epic ECHO - 08-13-20 [facility] Cardiac Cath - greater than 2 years Epic Pacemaker/ICD device last checked: Spinal Cord Stimulator: Cardiac MRI - 12-20-20 [facility] CT heart angiogram - 09-18-20 [facility]  Bowel Prep - N/A  Sleep Study - Yes, +sleep apnea CPAP - Yes  Fasting Blood Sugar - N/A Checks Blood Sugar _____ times a day  Blood Thinner Instructions:  N/A Aspirin Instructions: Last Dose:  Activity level:   Can go up a flight of stairs and perform activities of daily living without stopping and without symptoms of chest pain.  Patient does have shortness of breath with exertion due to lung disease.  This has  not worsened..     Anesthesia review:  Interstitial lung disease, lupus, OSA on CPAP.  Hx of chest pain, dyspnea and palpitations  Patient denies shortness of breath, fever, cough and chest pain at PAT appointment  Patient verbalized understanding of instructions that were given to them at the PAT appointment. Patient was also instructed that they will need to review over the PAT instructions again at home before surgery.

## 2021-08-12 NOTE — Progress Notes (Signed)
Surgery orders requested via Epic inbox. °

## 2021-08-12 NOTE — Patient Instructions (Addendum)
DUE TO COVID-19 ONLY ONE VISITOR IS ALLOWED TO COME WITH YOU AND STAY IN THE WAITING ROOM ONLY DURING PRE OP AND PROCEDURE.   **NO VISITORS ARE ALLOWED IN THE SHORT STAY AREA OR RECOVERY ROOM!!**  You are not required to quarantine, however you are required to wear a well-fitted mask when you are out and around people not in your household.  Hand Hygiene often Do NOT share personal items Notify your provider if you are in close contact with someone who has COVID or you develop fever 100.4 or greater, new onset of sneezing, cough, sore throat, shortness of breath or body aches.        Your procedure is scheduled on: Wednesday, 08-28-21   Report to [facility] Main  Entrance     Report to admitting at 7:45 AM   Call this number if you have problems the morning of surgery [phone]   Do not eat food :After Midnight.   May have liquids from midnight until 7:00 AM day of surgery  CLEAR LIQUID DIET  Foods Allowed                                                                     Foods Excluded  Water, Black Coffee (no milk/no creamer) and tea, regular and decaf                              liquids that you cannot  Plain Jell-O in any flavor  (No red)                         see through such as: Fruit ices (not with fruit pulp)                                 milk, soups, orange juice  Iced Popsicles (No red)                                    All solid food                             Apple juices Sports drinks like Gatorade (No red) Lightly seasoned clear broth or consume(fat free) Sugar   Complete one Ensure drink the morning of surgery at 7:00 AM the day of surgery.      The day of surgery:  Drink ONE (1) Pre-Surgery Clear Ensure the morning of surgery. Drink in one sitting. Do not sip.  This drink was given to you during your hospital  pre-op appointment visit. Nothing else to drink after completing the Pre-Surgery Clear Ensure          If you have questions,  please contact your surgeons office.     Oral Hygiene is also important to reduce your risk of infection.  Remember - BRUSH YOUR TEETH THE MORNING OF SURGERY WITH YOUR REGULAR TOOTHPASTE   Do NOT smoke after Midnight  Take these medicines the morning of surgery with A SIP OF WATER: Bupropion, Buspirone, Cellcept, Carvediolol.  Oxycodone if needed   Stop all vitamins and herbal supplements a week before surgery.     Stop Motrin, Aleve, Ibuprofen a week before surgery.   Bring CPAP mask and tubing day of surgery             You may not have any metal on your body including hair pins, jewelry, and body piercing             Do not wear make-up, lotions, powders, perfumes or deodorant  Do not wear nail polish including gel and S&S, artificial/acrylic nails, or any other type of covering on natural nails including finger and toenails. If you have artificial nails, gel coating, etc. that needs to be removed by a nail salon please have this removed prior to surgery or surgery may need to be canceled/ delayed if the surgeon/ anesthesia feels like they are unable to be safely monitored.   Do not shave  48 hours prior to surgery.    Contacts, dentures or bridgework may not be worn into surgery.    Patients discharged the day of surgery will not be allowed to drive home.   A responsible adult must remain with you for 24 hours after surgery.  Please read over the following fact sheets you were given: IF YOU HAVE QUESTIONS ABOUT YOUR PRE OP INSTRUCTIONS PLEASE CALL [facility] - Preparing for Surgery Before surgery, you can play an important role.  Because skin is not sterile, your skin needs to be as free of germs as possible.  You can reduce the number of germs on your skin by washing with CHG (chlorahexidine gluconate) soap before surgery.  CHG is an antiseptic cleaner which kills germs and bonds with the skin to continue killing germs even  after washing. Please DO NOT use if you have an allergy to CHG or antibacterial soaps.  If your skin becomes reddened/irritated stop using the CHG and inform your nurse when you arrive at Short Stay. Do not shave (including legs and underarms) for at least 48 hours prior to the first CHG shower.  You may shave your face/neck.  Please follow these instructions carefully:  1.  Shower with CHG Soap the night before surgery and the  morning of surgery.  2.  If you choose to wash your hair, wash your hair first as usual with your normal  shampoo.  3.  After you shampoo, rinse your hair and body thoroughly to remove the shampoo.                             4.  Use CHG as you would any other liquid soap.  You can apply chg directly to the skin and wash.  Gently with a scrungie or clean washcloth.  5.  Apply the CHG Soap to your body ONLY FROM THE NECK DOWN.   Do   not use on face/ open                           Wound or open sores. Avoid contact with eyes, ears mouth and   genitals (private parts).  Wash face,  Genitals (private parts) with your normal soap.             6.  Wash thoroughly, paying special attention to the area where your    surgery  will be performed.  7.  Thoroughly rinse your body with warm water from the neck down.  8.  DO NOT shower/wash with your normal soap after using and rinsing off the CHG Soap.                9.  Pat yourself dry with a clean towel.            10.  Wear clean pajamas.            11.  Place clean sheets on your bed the night of your first shower and do not  sleep with pets. Day of Surgery : Do not apply any lotions/deodorants the morning of surgery.  Please wear clean clothes to the hospital/surgery center.  FAILURE TO FOLLOW THESE INSTRUCTIONS MAY RESULT IN THE CANCELLATION OF YOUR SURGERY  PATIENT SIGNATURE_________________________________  NURSE  SIGNATURE__________________________________  ________________________________________________________________________

## 2021-08-14 ENCOUNTER — Ambulatory Visit: Payer: Self-pay | Admitting: Orthopedic Surgery

## 2021-08-14 NOTE — H&P (View-Only) (Signed)
Shannon [name] is an 51 y.o. female.   °Chief Complaint: R shoulder pain °HPI: Reason for Visit: right shoulder °Context: The patient is 6 1/2 months out °Location (Upper Extremity): shoulder pain on the right °Severity: pain level 6/10 °Timing: constant °Medications: The patient is taking ibuprofen and oxycodone ° °Past Medical History:  °Diagnosis Date  ° Anemia   ° Anxiety   ° Depression   ° Dyspnea   ° Sometimes at rest  ° G6PD deficiency   ° ILD (interstitial lung disease) (HCC)   ° Lupus (HCC)   ° Pneumonia   ° Rheumatoid arthritis (HCC)   ° Sleep apnea   ° ° °Past Surgical History:  °Procedure Laterality Date  ° ABDOMINAL HYSTERECTOMY    ° APPENDECTOMY  1980  ° cardiac catherization  08/20/2017  ° CARDIAC CATHETERIZATION N/A 05/10/2015  ° Procedure: Right Heart Cath;  Surgeon: Dalton S McLean, MD;  Location: [facility] INVAS[facility] CV LAB;  Service: Cardiovascular;  Laterality: N/A;  ° CESAREAN SECTION  '95, '02, '07  ° X 3  ° CHOLECYSTECTOMY  2010  ° LAPAROSCOPIC HYSTERECTOMY  10/2015  ° have ovaries  ° RIGHT HEART CATH N/A 08/20/2017  ° Procedure: RIGHT HEART CATH;  Surgeon: McLean, Dalton S, MD;  Location: [facility] INVASIVE CV LAB;  Service: Cardiovascular;  Laterality: N/A;  ° TOTAL KNEE ARTHROPLASTY Left 04/04/2019  ° Procedure: TOTAL KNEE ARTHROPLASTY;  Surgeon: Aluisio, Frank, MD;  Location: [facility];  Service: Orthopedics;  Laterality: Left;  50min  ° TOTAL KNEE ARTHROPLASTY Right 04/30/2020  ° Procedure: TOTAL KNEE ARTHROPLASTY;  Surgeon: Aluisio, Frank, MD;  Location: [facility];  Service: Orthopedics;  Laterality: Right;  50min  ° TUBAL LIGATION    ° ° °Family History  °Problem Relation Age of Onset  ° Sarcoidosis Mother   ° Lupus Sister   ° Healthy Daughter   ° Healthy Son   ° Healthy Son   ° °Social History:  reports that she quit smoking about 6 years ago. Her smoking use included cigarettes. She has a 7.00 pack-year smoking history. She has never used smokeless tobacco. She reports current alcohol use. She reports that  she does not use drugs. ° °Allergies:  °Allergies  °Allergen Reactions  ° Asa [Aspirin] Hives and Shortness Of Breath  °  Chest tightness   ° Penicillins Hives  °  Has patient had a PCN reaction causing immediate rash, facial/tongue/throat swelling, SOB or lightheadedness with hypotension: Unknown °Has patient had a PCN reaction causing severe rash involving mucus membranes or skin necrosis: Unknown °Has patient had a PCN reaction that required hospitalization: Unknown °Has patient had a PCN reaction occurring within the last 10 years: No °If all of the above answers are "NO", then may proceed with Cephalosporin use. ° °Tolerated Cephalosporin Date: 05/01/20. °   ° Sulfa Antibiotics   °  G6PD deficiency   ° ° °Current meds: °acetaminophen 500 mg tablet °albuterol sulfate HFA 90 mcg/actuation aerosol inhaler °Breo Ellipta 200 mcg-25 mcg/dose powder for inhalation °buPROPion HCL XL 300 mg 24 hr tablet, extended release °busPIRone 15 mg tablet °carvediloL 6.25 mg tablet °ciprofloxacin 250 mg tablet °clindamycin HCL 300 mg capsule °fluticasone propionate 50 mcg/actuation nasal spray,suspension °hydrOXYchloroQUINE 200 mg tablet °ibuprofen 600 mg tablet °lisinopriL 10 mg tablet °magnesium oxide 400 mg (241.3 mg magnesium) tablet °montelukast 10 mg tablet °mycophenolate mofetiL 500 mg tablet °oxyCODONE 10 mg tablet °QUEtiapine ER 300 mg tablet,extended release 24 hr °sertraline 100 mg tablet ° °Review of Systems  °Constitutional:   Negative.   HENT: Negative.    Eyes: Negative.   Respiratory: Negative.    Cardiovascular: Negative.   Gastrointestinal: Negative.   Endocrine: Negative.   Genitourinary: Negative.   Musculoskeletal:  Positive for arthralgias and joint swelling.  Skin: Negative.   Neurological: Negative.   Hematological: Negative.   Psychiatric/Behavioral: Negative.     Last menstrual period 04/28/2015. Physical Exam Constitutional:      Appearance: Normal appearance.  HENT:     Head:  Normocephalic and atraumatic.     Right Ear: External ear normal.     Left Ear: External ear normal.     Nose: Nose normal.     Mouth/Throat:     Pharynx: Oropharynx is clear.  Eyes:     Conjunctiva/sclera: Conjunctivae normal.  Cardiovascular:     Rate and Rhythm: Normal rate.     Pulses: Normal pulses.     Heart sounds: Normal heart sounds.  Pulmonary:     Effort: Pulmonary effort is normal.  Abdominal:     General: Bowel sounds are normal.  Musculoskeletal:     Cervical back: Normal range of motion.     Comments: Constitutional: General Appearance: healthy-appearing and NAD.  Psychiatric: Mood and Affect: normal mood and affect.  Cardiovascular System: Arterial Pulses Right: radial normal and brachial normal. Varicosities Right: no varicosities.  C-Spine/Neck: Active Range of Motion: flexion normal, extension normal, and no pain elicited on motion.  Shoulders: Inspection Right: no misalignment, atrophy, erythema, swelling, or scapular winging. Bony Palpation Right: no tenderness of the sternoclavicular joint, the coracoid process, the acromioclavicular joint, the bicipital groove, or the scapula. Soft Tissue Palpation Right: tenderness of the supraspinatus and the subacromial bursa. Active Range of Motion Right: limited. Special Tests Right: Speed's test negative and Neer's test positive. Stability Right: no laxity, sulcus sign negative, and anterior apprehension test negative. Strength Right: abduction 5/5, adduction 5/5, flexion 5/5, and extension 5/5.  Skin: Right Upper Extremity: normal.  Neurological System: Biceps Reflex Right: normal (2). Brachioradialis Reflex Right: normal (2). Triceps Reflex Right: normal (2). Sensation on the Right: C5 normal, C6 normal, and C7 normal.  Neurological:     Mental Status: She is alert.    MRI indicating subacromial bursitis. No rotator cuff tear. Mild AC arthrosis.  Assessment/Plan Impression:  Refractory impingement syndrome of the  right shoulder  Plan:  We discussed options at this point time failing conservative treatment that included subacromial injections with temporary relief as well as physical therapy and activity modification. Either living with her symptoms or proceeding with a shoulder arthroscopy and subacromial decompression She would like to proceed with that. No history of DVT or MRSA.  I had an extensive discussion with the patient concerning her pathology and relevant anatomy. To the persistence of their symptoms despite conservative treatment to include an exercise program, activity modification and injections we discussed proceeding with a shoulder arthroscopy. Discussed the procedure in detail including the risks and benefits of improvement in symptoms, worsening of their symptoms, no changes in her symptoms. I discussed decompressing the subacromial space by a bursectomy and CA ligament release as well as acromioplasty if needed. Additionally discussed evaluating the labrum and biceps if appropriate. In addition I discussed the possibility that if an occult tear of the rotator cuff is noted that it may require a mini open rotator cuff repair. In addition discussed the outpatient procedure. Follow-up in 2 weeks. Pendulum exercises and appropriate postoperative analgesics.  Plan Right shoulder scope, SAD, debridement  Dayna BarkerJaclyn M  Christene Lye, PA-C for Dr. Shelle Iron 08/14/2021, 2:55 PM

## 2021-08-14 NOTE — H&P (Signed)
Shannon [name] is an 51 y.o. female.   Chief Complaint: R shoulder pain HPI: Reason for Visit: right shoulder Context: The patient is 6 1/2 months out Location (Upper Extremity): shoulder pain on the right Severity: pain level 6/10 Timing: constant Medications: The patient is taking ibuprofen and oxycodone  Past Medical History:  Diagnosis Date   Anemia    Anxiety    Depression    Dyspnea    Sometimes at rest   G6PD deficiency    ILD (interstitial lung disease) (HCC)    Lupus (HCC)    Pneumonia    Rheumatoid arthritis (HCC)    Sleep apnea     Past Surgical History:  Procedure Laterality Date   ABDOMINAL HYSTERECTOMY     APPENDECTOMY  1980   cardiac catherization  08/20/2017   CARDIAC CATHETERIZATION N/A 05/10/2015   Procedure: Right Heart Cath;  Surgeon: Laurey Morale, MD;  Location: [facility] INVASIVE CV LAB;  Service: Cardiovascular;  Laterality: N/A;   CESAREAN SECTION  '95, '02, '07   X 3   CHOLECYSTECTOMY  2010   LAPAROSCOPIC HYSTERECTOMY  10/2015   have ovaries   RIGHT HEART CATH N/A 08/20/2017   Procedure: RIGHT HEART CATH;  Surgeon: Laurey Morale, MD;  Location: [facility] INVASIVE CV LAB;  Service: Cardiovascular;  Laterality: N/A;   TOTAL KNEE ARTHROPLASTY Left 04/04/2019   Procedure: TOTAL KNEE ARTHROPLASTY;  Surgeon: Ollen Gross, MD;  Location: [facility];  Service: Orthopedics;  Laterality: Left;    TOTAL KNEE ARTHROPLASTY Right 04/30/2020   Procedure: TOTAL KNEE ARTHROPLASTY;  Surgeon: Ollen Gross, MD;  Location: [facility];  Service: Orthopedics;  Laterality: Right;    TUBAL LIGATION      Family History  Problem Relation Age of Onset   Sarcoidosis Mother    Lupus Sister    Healthy Daughter    Healthy Son    Healthy Son    Social History:  reports that she quit smoking about 6 years ago. Her smoking use included cigarettes. She has a 7.00 pack-year smoking history. She has never used smokeless tobacco. She reports current alcohol use. She reports that  she does not use drugs.  Allergies:  Allergies  Allergen Reactions   Asa [Aspirin] Hives and Shortness Of Breath    Chest tightness    Penicillins Hives    Has patient had a PCN reaction causing immediate rash, facial/tongue/throat swelling, SOB or lightheadedness with hypotension: Unknown Has patient had a PCN reaction causing severe rash involving mucus membranes or skin necrosis: Unknown Has patient had a PCN reaction that required hospitalization: Unknown Has patient had a PCN reaction occurring within the last 10 years: No If all of the above answers are "NO", then may proceed with Cephalosporin use.  Tolerated Cephalosporin Date: 05/01/20.     Sulfa Antibiotics     G6PD deficiency     Current meds: acetaminophen 500 mg tablet albuterol sulfate HFA 90 mcg/actuation aerosol inhaler Breo Ellipta 200 mcg-25 mcg/dose powder for inhalation buPROPion HCL XL 300 mg 24 hr tablet, extended release busPIRone 15 mg tablet carvediloL 6.25 mg tablet ciprofloxacin 250 mg tablet clindamycin HCL 300 mg capsule fluticasone propionate 50 mcg/actuation nasal spray,suspension hydrOXYchloroQUINE 200 mg tablet ibuprofen 600 mg tablet lisinopriL 10 mg tablet magnesium oxide 400 mg (241.3 mg magnesium) tablet montelukast 10 mg tablet mycophenolate mofetiL 500 mg tablet oxyCODONE 10 mg tablet QUEtiapine ER 300 mg tablet,extended release 24 hr sertraline 100 mg tablet  Review of Systems  Constitutional:  Negative.   HENT: Negative.    Eyes: Negative.   Respiratory: Negative.    Cardiovascular: Negative.   Gastrointestinal: Negative.   Endocrine: Negative.   Genitourinary: Negative.   Musculoskeletal:  Positive for arthralgias and joint swelling.  Skin: Negative.   Neurological: Negative.   Hematological: Negative.   Psychiatric/Behavioral: Negative.     Last menstrual period 04/28/2015. Physical Exam Constitutional:      Appearance: Normal appearance.  HENT:     Head:  Normocephalic and atraumatic.     Right Ear: External ear normal.     Left Ear: External ear normal.     Nose: Nose normal.     Mouth/Throat:     Pharynx: Oropharynx is clear.  Eyes:     Conjunctiva/sclera: Conjunctivae normal.  Cardiovascular:     Rate and Rhythm: Normal rate.     Pulses: Normal pulses.     Heart sounds: Normal heart sounds.  Pulmonary:     Effort: Pulmonary effort is normal.  Abdominal:     General: Bowel sounds are normal.  Musculoskeletal:     Cervical back: Normal range of motion.     Comments: Constitutional: General Appearance: healthy-appearing and NAD.  Psychiatric: Mood and Affect: normal mood and affect.  Cardiovascular System: Arterial Pulses Right: radial normal and brachial normal. Varicosities Right: no varicosities.  C-Spine/Neck: Active Range of Motion: flexion normal, extension normal, and no pain elicited on motion.  Shoulders: Inspection Right: no misalignment, atrophy, erythema, swelling, or scapular winging. Bony Palpation Right: no tenderness of the sternoclavicular joint, the coracoid process, the acromioclavicular joint, the bicipital groove, or the scapula. Soft Tissue Palpation Right: tenderness of the supraspinatus and the subacromial bursa. Active Range of Motion Right: limited. Special Tests Right: Speed's test negative and Neer's test positive. Stability Right: no laxity, sulcus sign negative, and anterior apprehension test negative. Strength Right: abduction 5/5, adduction 5/5, flexion 5/5, and extension 5/5.  Skin: Right Upper Extremity: normal.  Neurological System: Biceps Reflex Right: normal (2). Brachioradialis Reflex Right: normal (2). Triceps Reflex Right: normal (2). Sensation on the Right: C5 normal, C6 normal, and C7 normal.  Neurological:     Mental Status: She is alert.    MRI indicating subacromial bursitis. No rotator cuff tear. Mild AC arthrosis.  Assessment/Plan Impression:  Refractory impingement syndrome of the  right shoulder  Plan:  We discussed options at this point time failing conservative treatment that included subacromial injections with temporary relief as well as physical therapy and activity modification. Either living with her symptoms or proceeding with a shoulder arthroscopy and subacromial decompression She would like to proceed with that. No history of DVT or MRSA.  I had an extensive discussion with the patient concerning her pathology and relevant anatomy. To the persistence of their symptoms despite conservative treatment to include an exercise program, activity modification and injections we discussed proceeding with a shoulder arthroscopy. Discussed the procedure in detail including the risks and benefits of improvement in symptoms, worsening of their symptoms, no changes in her symptoms. I discussed decompressing the subacromial space by a bursectomy and CA ligament release as well as acromioplasty if needed. Additionally discussed evaluating the labrum and biceps if appropriate. In addition I discussed the possibility that if an occult tear of the rotator cuff is noted that it may require a mini open rotator cuff repair. In addition discussed the outpatient procedure. Follow-up in 2 weeks. Pendulum exercises and appropriate postoperative analgesics.  Plan Right shoulder scope, SAD, debridement  Dayna BarkerJaclyn M  Christene Lye, PA-C for Dr. Shelle Iron 08/14/2021, 2:55 PM

## 2021-08-15 ENCOUNTER — Encounter (HOSPITAL_COMMUNITY)
Admission: RE | Admit: 2021-08-15 | Discharge: 2021-08-15 | Disposition: A | Discharge status: Home / Self Care | Payer: Medicare HMO | Source: Ambulatory Visit | Attending: Specialist | Admitting: Specialist

## 2021-08-15 ENCOUNTER — Other Ambulatory Visit: Payer: Self-pay

## 2021-08-15 ENCOUNTER — Encounter (HOSPITAL_COMMUNITY): Payer: Self-pay

## 2021-08-15 DIAGNOSIS — M7541 Impingement syndrome of right shoulder: Secondary | ICD-10-CM | POA: Diagnosis not present

## 2021-08-15 DIAGNOSIS — Z96652 Presence of left artificial knee joint: Secondary | ICD-10-CM | POA: Diagnosis not present

## 2021-08-15 DIAGNOSIS — G894 Chronic pain syndrome: Secondary | ICD-10-CM | POA: Diagnosis not present

## 2021-08-15 DIAGNOSIS — Z01812 Encounter for preprocedural laboratory examination: Secondary | ICD-10-CM | POA: Diagnosis present

## 2021-08-15 DIAGNOSIS — Z96651 Presence of right artificial knee joint: Secondary | ICD-10-CM | POA: Diagnosis not present

## 2021-08-15 DIAGNOSIS — J849 Interstitial pulmonary disease, unspecified: Secondary | ICD-10-CM | POA: Diagnosis not present

## 2021-08-15 DIAGNOSIS — M1711 Unilateral primary osteoarthritis, right knee: Secondary | ICD-10-CM | POA: Diagnosis not present

## 2021-08-15 DIAGNOSIS — M25561 Pain in right knee: Secondary | ICD-10-CM | POA: Diagnosis not present

## 2021-08-15 DIAGNOSIS — Z79891 Long term (current) use of opiate analgesic: Secondary | ICD-10-CM | POA: Diagnosis not present

## 2021-08-15 DIAGNOSIS — M329 Systemic lupus erythematosus, unspecified: Secondary | ICD-10-CM | POA: Diagnosis not present

## 2021-08-15 HISTORY — DX: Gastro-esophageal reflux disease without esophagitis: K21.9

## 2021-08-15 HISTORY — DX: Essential (primary) hypertension: I10

## 2021-08-15 LAB — BASIC METABOLIC PANEL
Anion gap: 8 (ref 5–15)
BUN: 15 mg/dL (ref 6–20)
CO2: 26 mmol/L (ref 22–32)
Calcium: 8.9 mg/dL (ref 8.9–10.3)
Chloride: 105 mmol/L (ref 98–111)
Creatinine, Ser: 0.95 mg/dL (ref 0.44–1.00)
GFR, Estimated: >60 mL/min (ref >60)
Glucose, Bld: 108 mg/dL — ABNORMAL HIGH (ref 70–99)
Potassium: 3.2 mmol/L — ABNORMAL LOW (ref 3.5–5.1)
Sodium: 139 mmol/L (ref 135–145)

## 2021-08-19 DIAGNOSIS — Z96652 Presence of left artificial knee joint: Secondary | ICD-10-CM | POA: Diagnosis not present

## 2021-08-19 DIAGNOSIS — E669 Obesity, unspecified: Secondary | ICD-10-CM | POA: Diagnosis not present

## 2021-08-19 DIAGNOSIS — R69 Illness, unspecified: Secondary | ICD-10-CM | POA: Diagnosis not present

## 2021-08-19 DIAGNOSIS — Z79891 Long term (current) use of opiate analgesic: Secondary | ICD-10-CM | POA: Diagnosis not present

## 2021-08-19 DIAGNOSIS — M47816 Spondylosis without myelopathy or radiculopathy, lumbar region: Secondary | ICD-10-CM | POA: Diagnosis not present

## 2021-08-19 DIAGNOSIS — G894 Chronic pain syndrome: Secondary | ICD-10-CM | POA: Diagnosis not present

## 2021-08-19 DIAGNOSIS — Z96651 Presence of right artificial knee joint: Secondary | ICD-10-CM | POA: Diagnosis not present

## 2021-08-19 DIAGNOSIS — F411 Generalized anxiety disorder: Secondary | ICD-10-CM | POA: Diagnosis not present

## 2021-08-19 DIAGNOSIS — M25561 Pain in right knee: Secondary | ICD-10-CM | POA: Diagnosis not present

## 2021-08-19 DIAGNOSIS — M1711 Unilateral primary osteoarthritis, right knee: Secondary | ICD-10-CM | POA: Diagnosis not present

## 2021-08-19 DIAGNOSIS — Z1389 Encounter for screening for other disorder: Secondary | ICD-10-CM | POA: Diagnosis not present

## 2021-08-19 DIAGNOSIS — F332 Major depressive disorder, recurrent severe without psychotic features: Secondary | ICD-10-CM | POA: Diagnosis not present

## 2021-08-20 NOTE — Progress Notes (Signed)
Anesthesia Chart Review   Case: [id number] Date/Time: 08/28/21 0945   Procedure: SHOULDER ARTHROSCOPY WITH SUBACROMIAL DECOMPRESSION AND DEBRIDEMENT (Right)   Anesthesia type: Choice   Pre-op diagnosis: Right shoulder impingement syndrome   [facility]: [facility] ROOM 10 / [facility]   Surgeons: Susa Day, MD       DISCUSSION:51 y.o. former smoker with h/o HTN, GERD, Lupus with lung involvement, ILD, sleep apnea, G6PD deficiency, right shoulder impingement syndrome scheduled for above procedure 08/28/2021 with Dr. Susa Day.   Pt last seen by pulmonologist 03/05/21. Per note pt clinically stable with symptoms and walk test.   Anticipate pt can proceed with planned procedure barring acute status change.   VS: BP (!) 136/98    Pulse 98    Temp 36.9 C (Oral)    Resp 20    Ht 5\' 11"  (1.803 m)    Wt 87.8 kg    LMP 04/28/2015    SpO2 100%    BMI 27.00 kg/m   PROVIDERS: [name], [name], PA-C is PCP    LABS: Labs reviewed: Acceptable for surgery. (all labs ordered are listed, but only abnormal results are displayed)  Labs Reviewed  BASIC METABOLIC PANEL - Abnormal; Notable for the following components:      Result Value   Potassium 3.2 (*)    Glucose, Bld 108 (*)    All other components within normal limits     IMAGES:   EKG: On chart   CV: Echo 07/15/2017 - Left ventricle: The cavity size was normal. Wall thickness was    normal. Systolic function was normal. The estimated ejection    fraction was in the range of 55% to 60%. Wall motion was normal;    there were no regional wall motion abnormalities. Features are    consistent with a pseudonormal left ventricular filling pattern,    with concomitant abnormal relaxation and increased filling    pressure (grade 2 diastolic dysfunction).  - Mitral valve: There was mild regurgitation.  - Left atrium: The atrium was mildly dilated.  - Right atrium: The atrium was mildly dilated.  - Pulmonary arteries: Systolic pressure was mildly  increased. PA    peak pressure: 35 mm Hg (S).  Past Medical History:  Diagnosis Date   Anemia    Anxiety    Depression    Dyspnea    Sometimes at rest   G6PD deficiency    GERD (gastroesophageal reflux disease)    Hypertension    ILD (interstitial lung disease) ([place])    Lupus (HCC)    Pneumonia    Rheumatoid arthritis ([place])    Sleep apnea     Past Surgical History:  Procedure Laterality Date   ABDOMINAL HYSTERECTOMY     APPENDECTOMY  1980   cardiac catherization  08/20/2017   CARDIAC CATHETERIZATION N/A 05/10/2015   Procedure: Right Heart Cath;  Surgeon: Larey Dresser, MD;  Location: [facility];  Service: Cardiovascular;  Laterality: N/A;   CESAREAN SECTION  '95, '02, '07   X 3   CHOLECYSTECTOMY  2010   LAPAROSCOPIC HYSTERECTOMY  10/2015   have ovaries   RIGHT HEART CATH N/A 08/20/2017   Procedure: RIGHT HEART CATH;  Surgeon: Larey Dresser, MD;  Location: [facility] CV LAB;  Service: Cardiovascular;  Laterality: N/A;   TOTAL KNEE ARTHROPLASTY Left 04/04/2019   Procedure: TOTAL KNEE ARTHROPLASTY;  Surgeon: Gaynelle Arabian, MD;  Location: [facility];  Service: Orthopedics;  Laterality: Left;  38min   TOTAL KNEE  ARTHROPLASTY Right 04/30/2020   Procedure: TOTAL KNEE ARTHROPLASTY;  Surgeon: Gaynelle Arabian, MD;  Location: [facility];  Service: Orthopedics;  Laterality: Right;  76min   TUBAL LIGATION      MEDICATIONS:  albuterol (VENTOLIN HFA) 108 (90 Base) MCG/ACT inhaler   buPROPion (WELLBUTRIN XL) 150 MG 24 hr tablet   busPIRone (BUSPAR) 15 MG tablet   carvedilol (COREG) 6.25 MG tablet   famotidine (PEPCID) 20 MG tablet   fluticasone (FLONASE) 50 MCG/ACT nasal spray   fluticasone furoate-vilanterol (BREO ELLIPTA) 100-25 MCG/ACT AEPB   fluticasone furoate-vilanterol (BREO ELLIPTA) 200-25 MCG/ACT AEPB   hydroxychloroquine (PLAQUENIL) 200 MG tablet   lisinopril (ZESTRIL) 10 MG tablet   mycophenolate (CELLCEPT) 500 MG tablet   Oxycodone HCl 10 MG TABS   QUEtiapine  (SEROQUEL) 200 MG tablet   sertraline (ZOLOFT) 100 MG tablet   No current facility-administered medications for this encounter.   Konrad Felix Ward, PA-C [facility] [phone]

## 2021-08-26 DIAGNOSIS — M1711 Unilateral primary osteoarthritis, right knee: Secondary | ICD-10-CM | POA: Diagnosis not present

## 2021-08-26 DIAGNOSIS — M25561 Pain in right knee: Secondary | ICD-10-CM | POA: Diagnosis not present

## 2021-08-26 DIAGNOSIS — G894 Chronic pain syndrome: Secondary | ICD-10-CM | POA: Diagnosis not present

## 2021-08-26 DIAGNOSIS — Z96652 Presence of left artificial knee joint: Secondary | ICD-10-CM | POA: Diagnosis not present

## 2021-08-26 DIAGNOSIS — M329 Systemic lupus erythematosus, unspecified: Secondary | ICD-10-CM | POA: Diagnosis not present

## 2021-08-26 DIAGNOSIS — Z96651 Presence of right artificial knee joint: Secondary | ICD-10-CM | POA: Diagnosis not present

## 2021-08-26 DIAGNOSIS — Z79891 Long term (current) use of opiate analgesic: Secondary | ICD-10-CM | POA: Diagnosis not present

## 2021-08-26 DIAGNOSIS — J849 Interstitial pulmonary disease, unspecified: Secondary | ICD-10-CM | POA: Diagnosis not present

## 2021-08-27 ENCOUNTER — Ambulatory Visit (HOSPITAL_COMMUNITY)
Admission: RE | Admit: 2021-08-27 | Discharge: 2021-08-27 | Disposition: A | Discharge status: Home / Self Care | Payer: Medicare HMO | Source: Ambulatory Visit | Attending: Internal Medicine | Admitting: Internal Medicine

## 2021-08-27 ENCOUNTER — Other Ambulatory Visit: Payer: Self-pay

## 2021-08-27 DIAGNOSIS — J84112 Idiopathic pulmonary fibrosis: Secondary | ICD-10-CM | POA: Diagnosis not present

## 2021-08-27 DIAGNOSIS — I517 Cardiomegaly: Secondary | ICD-10-CM | POA: Diagnosis not present

## 2021-08-27 DIAGNOSIS — J984 Other disorders of lung: Secondary | ICD-10-CM | POA: Diagnosis not present

## 2021-08-27 DIAGNOSIS — J849 Interstitial pulmonary disease, unspecified: Secondary | ICD-10-CM | POA: Insufficient documentation

## 2021-08-27 DIAGNOSIS — K449 Diaphragmatic hernia without obstruction or gangrene: Secondary | ICD-10-CM | POA: Diagnosis not present

## 2021-08-28 ENCOUNTER — Ambulatory Visit (HOSPITAL_BASED_OUTPATIENT_CLINIC_OR_DEPARTMENT_OTHER): Payer: Medicare HMO | Admitting: Certified Registered"

## 2021-08-28 ENCOUNTER — Ambulatory Visit (HOSPITAL_COMMUNITY): Payer: Medicare HMO | Admitting: Physician Assistant

## 2021-08-28 ENCOUNTER — Ambulatory Visit (HOSPITAL_COMMUNITY)
Admission: RE | Admit: 2021-08-28 | Discharge: 2021-08-28 | Disposition: A | Discharge status: Home / Self Care | Payer: Medicare HMO | Source: Ambulatory Visit | Attending: Specialist | Admitting: Specialist

## 2021-08-28 ENCOUNTER — Encounter (HOSPITAL_COMMUNITY): Payer: Self-pay | Admitting: Specialist

## 2021-08-28 ENCOUNTER — Encounter (HOSPITAL_COMMUNITY)
Admission: RE | Disposition: A | Discharge status: Home / Self Care | Payer: Self-pay | Source: Ambulatory Visit | Attending: Specialist

## 2021-08-28 DIAGNOSIS — Z87891 Personal history of nicotine dependence: Secondary | ICD-10-CM | POA: Diagnosis not present

## 2021-08-28 DIAGNOSIS — Z79899 Other long term (current) drug therapy: Secondary | ICD-10-CM | POA: Insufficient documentation

## 2021-08-28 DIAGNOSIS — J45909 Unspecified asthma, uncomplicated: Secondary | ICD-10-CM | POA: Diagnosis not present

## 2021-08-28 DIAGNOSIS — G4730 Sleep apnea, unspecified: Secondary | ICD-10-CM | POA: Diagnosis not present

## 2021-08-28 DIAGNOSIS — M7541 Impingement syndrome of right shoulder: Secondary | ICD-10-CM | POA: Insufficient documentation

## 2021-08-28 DIAGNOSIS — S46019A Strain of muscle(s) and tendon(s) of the rotator cuff of unspecified shoulder, initial encounter: Secondary | ICD-10-CM | POA: Insufficient documentation

## 2021-08-28 DIAGNOSIS — K219 Gastro-esophageal reflux disease without esophagitis: Secondary | ICD-10-CM | POA: Diagnosis not present

## 2021-08-28 DIAGNOSIS — M069 Rheumatoid arthritis, unspecified: Secondary | ICD-10-CM | POA: Insufficient documentation

## 2021-08-28 DIAGNOSIS — X58XXXA Exposure to other specified factors, initial encounter: Secondary | ICD-10-CM | POA: Diagnosis not present

## 2021-08-28 DIAGNOSIS — I10 Essential (primary) hypertension: Secondary | ICD-10-CM | POA: Diagnosis not present

## 2021-08-28 DIAGNOSIS — F32A Depression, unspecified: Secondary | ICD-10-CM | POA: Insufficient documentation

## 2021-08-28 DIAGNOSIS — F419 Anxiety disorder, unspecified: Secondary | ICD-10-CM | POA: Insufficient documentation

## 2021-08-28 DIAGNOSIS — M75111 Incomplete rotator cuff tear or rupture of right shoulder, not specified as traumatic: Secondary | ICD-10-CM | POA: Diagnosis not present

## 2021-08-28 DIAGNOSIS — R69 Illness, unspecified: Secondary | ICD-10-CM | POA: Diagnosis not present

## 2021-08-28 DIAGNOSIS — G8918 Other acute postprocedural pain: Secondary | ICD-10-CM | POA: Diagnosis not present

## 2021-08-28 DIAGNOSIS — Z6841 Body Mass Index (BMI) 40.0 and over, adult: Secondary | ICD-10-CM | POA: Diagnosis not present

## 2021-08-28 DIAGNOSIS — M25511 Pain in right shoulder: Secondary | ICD-10-CM | POA: Diagnosis present

## 2021-08-28 DIAGNOSIS — S43431A Superior glenoid labrum lesion of right shoulder, initial encounter: Secondary | ICD-10-CM | POA: Diagnosis not present

## 2021-08-28 HISTORY — PX: SHOULDER ARTHROSCOPY WITH SUBACROMIAL DECOMPRESSION: SHX5684

## 2021-08-28 LAB — CBC
HCT: 33.4 % — ABNORMAL LOW (ref 36.0–46.0)
Hemoglobin: 10.5 g/dL — ABNORMAL LOW (ref 12.0–15.0)
MCH: 30.6 pg (ref 26.0–34.0)
MCHC: 31.4 g/dL (ref 30.0–36.0)
MCV: 97.4 fL (ref 80.0–100.0)
Platelets: 209 10*3/uL (ref 150–400)
RBC: 3.43 MIL/uL — ABNORMAL LOW (ref 3.87–5.11)
RDW: 14.6 % (ref 11.5–15.5)
WBC: 5 10*3/uL (ref 4.0–10.5)
nRBC: 0 % (ref 0.0–0.2)

## 2021-08-28 SURGERY — SHOULDER ARTHROSCOPY WITH SUBACROMIAL DECOMPRESSION
Anesthesia: General | Laterality: Right

## 2021-08-28 MED ORDER — FENTANYL CITRATE PF 50 MCG/ML IJ SOSY
50.0000 ug | PREFILLED_SYRINGE | Freq: Once | INTRAMUSCULAR | Status: AC
  Administered 2021-08-28: 50 ug via INTRAVENOUS
  Filled 2021-08-28: qty 2

## 2021-08-28 MED ORDER — ALBUTEROL SULFATE (2.5 MG/3ML) 0.083% IN NEBU
INHALATION_SOLUTION | RESPIRATORY_TRACT | Status: AC
  Administered 2021-08-28: 2.5 mg
  Filled 2021-08-28: qty 3

## 2021-08-28 MED ORDER — LIDOCAINE 2% (20 MG/ML) 5 ML SYRINGE
INTRAMUSCULAR | Status: DC | PRN
Start: 2021-08-28 — End: 2021-08-28
  Administered 2021-08-28: 40 mg via INTRAVENOUS

## 2021-08-28 MED ORDER — ACETAMINOPHEN 10 MG/ML IV SOLN
INTRAVENOUS | Status: AC
  Filled 2021-08-28: qty 100

## 2021-08-28 MED ORDER — FENTANYL CITRATE (PF) 100 MCG/2ML IJ SOLN
INTRAMUSCULAR | Status: DC | PRN
  Administered 2021-08-28: 25 ug via INTRAVENOUS

## 2021-08-28 MED ORDER — AMISULPRIDE (ANTIEMETIC) 5 MG/2ML IV SOLN: 10.0000 mg | Freq: Once | INTRAVENOUS | Status: DC | PRN

## 2021-08-28 MED ORDER — ACETAMINOPHEN 325 MG PO TABS: 325.0000 mg | ORAL_TABLET | ORAL | Status: DC | PRN

## 2021-08-28 MED ORDER — BUPIVACAINE-EPINEPHRINE 0.5% -1:200000 IJ SOLN
INTRAMUSCULAR | Status: DC | PRN
  Administered 2021-08-28: 9 mL

## 2021-08-28 MED ORDER — DOCUSATE SODIUM 100 MG PO CAPS: 100.0000 mg | ORAL_CAPSULE | Freq: Two times a day (BID) | ORAL | 1 refills | Status: DC | PRN

## 2021-08-28 MED ORDER — SCOPOLAMINE 1 MG/3DAYS TD PT72
MEDICATED_PATCH | TRANSDERMAL | Status: AC
  Filled 2021-08-28: qty 1

## 2021-08-28 MED ORDER — SCOPOLAMINE 1 MG/3DAYS TD PT72: 1.0000 | MEDICATED_PATCH | TRANSDERMAL | Status: DC

## 2021-08-28 MED ORDER — BUPIVACAINE-EPINEPHRINE 0.5% -1:200000 IJ SOLN
INTRAMUSCULAR | Status: AC
  Filled 2021-08-28: qty 1

## 2021-08-28 MED ORDER — LACTATED RINGERS IR SOLN
Status: DC | PRN
  Administered 2021-08-28: 3000 mL

## 2021-08-28 MED ORDER — LACTATED RINGERS IV SOLN: INTRAVENOUS | Status: DC

## 2021-08-28 MED ORDER — ACETAMINOPHEN 160 MG/5ML PO SOLN: 325.0000 mg | ORAL | Status: DC | PRN

## 2021-08-28 MED ORDER — OXYCODONE HCL 5 MG PO TABS: 5.0000 mg | ORAL_TABLET | Freq: Once | ORAL | Status: DC | PRN

## 2021-08-28 MED ORDER — IPRATROPIUM-ALBUTEROL 0.5-2.5 (3) MG/3ML IN SOLN
3.0000 mL | RESPIRATORY_TRACT | Status: DC
  Administered 2021-08-28: 3 mL via RESPIRATORY_TRACT
  Filled 2021-08-28: qty 3

## 2021-08-28 MED ORDER — ORAL CARE MOUTH RINSE: 15.0000 mL | Freq: Once | OROMUCOSAL | Status: AC

## 2021-08-28 MED ORDER — ROCURONIUM BROMIDE 10 MG/ML (PF) SYRINGE
PREFILLED_SYRINGE | INTRAVENOUS | Status: DC | PRN
  Administered 2021-08-28: 70 mg via INTRAVENOUS

## 2021-08-28 MED ORDER — NALOXONE HCL 0.4 MG/ML IJ SOLN
INTRAMUSCULAR | Status: DC | PRN
  Administered 2021-08-28: 40 ug via INTRAVENOUS

## 2021-08-28 MED ORDER — PHENYLEPHRINE HCL-NACL 20-0.9 MG/250ML-% IV SOLN
INTRAVENOUS | Status: DC | PRN
  Administered 2021-08-28: 40 ug/min via INTRAVENOUS

## 2021-08-28 MED ORDER — APIXABAN 2.5 MG PO TABS: 2.5000 mg | ORAL_TABLET | Freq: Two times a day (BID) | ORAL | 1 refills | Status: DC

## 2021-08-28 MED ORDER — BUPIVACAINE HCL (PF) 0.5 % IJ SOLN: INTRAMUSCULAR | Status: DC | PRN

## 2021-08-28 MED ORDER — PHENYLEPHRINE HCL (PRESSORS) 10 MG/ML IV SOLN
INTRAVENOUS | Status: AC
  Filled 2021-08-28: qty 1

## 2021-08-28 MED ORDER — OXYCODONE HCL 5 MG/5ML PO SOLN: 5.0000 mg | Freq: Once | ORAL | Status: DC | PRN

## 2021-08-28 MED ORDER — CLINDAMYCIN PHOSPHATE 900 MG/50ML IV SOLN
900.0000 mg | INTRAVENOUS | Status: AC
  Administered 2021-08-28: 900 mg via INTRAVENOUS
  Filled 2021-08-28: qty 50

## 2021-08-28 MED ORDER — ACETAMINOPHEN 10 MG/ML IV SOLN
1000.0000 mg | Freq: Once | INTRAVENOUS | Status: DC | PRN
  Administered 2021-08-28: 1000 mg via INTRAVENOUS

## 2021-08-28 MED ORDER — METHOCARBAMOL 500 MG PO TABS: 500.0000 mg | ORAL_TABLET | Freq: Three times a day (TID) | ORAL | 1 refills | Status: DC | PRN

## 2021-08-28 MED ORDER — FENTANYL CITRATE PF 50 MCG/ML IJ SOSY: 25.0000 ug | PREFILLED_SYRINGE | INTRAMUSCULAR | Status: DC | PRN

## 2021-08-28 MED ORDER — EPINEPHRINE PF 1 MG/ML IJ SOLN
INTRAMUSCULAR | Status: DC | PRN
  Administered 2021-08-28: 1 mg

## 2021-08-28 MED ORDER — BUPIVACAINE HCL (PF) 0.5 % IJ SOLN
INTRAMUSCULAR | Status: DC | PRN
Start: 2021-08-28 — End: 2021-08-28
  Administered 2021-08-28: 12 mL via PERINEURAL

## 2021-08-28 MED ORDER — DEXAMETHASONE SODIUM PHOSPHATE 4 MG/ML IJ SOLN
INTRAMUSCULAR | Status: DC | PRN
Start: 2021-08-28 — End: 2021-08-28
  Administered 2021-08-28: 10 mg via INTRAVENOUS

## 2021-08-28 MED ORDER — MIDAZOLAM HCL 2 MG/2ML IJ SOLN
1.0000 mg | Freq: Once | INTRAMUSCULAR | Status: AC
  Administered 2021-08-28: 2 mg via INTRAVENOUS
  Filled 2021-08-28: qty 2

## 2021-08-28 MED ORDER — PROPOFOL 10 MG/ML IV BOLUS
INTRAVENOUS | Status: AC
  Filled 2021-08-28: qty 20

## 2021-08-28 MED ORDER — SUGAMMADEX SODIUM 200 MG/2ML IV SOLN
INTRAVENOUS | Status: DC | PRN
  Administered 2021-08-28 (×2): 200 mg via INTRAVENOUS

## 2021-08-28 MED ORDER — CHLORHEXIDINE GLUCONATE 0.12 % MT SOLN
15.0000 mL | Freq: Once | OROMUCOSAL | Status: AC
  Administered 2021-08-28: 15 mL via OROMUCOSAL

## 2021-08-28 MED ORDER — FENTANYL CITRATE (PF) 100 MCG/2ML IJ SOLN
INTRAMUSCULAR | Status: AC
Start: 2021-08-28 — End: ?
  Filled 2021-08-28: qty 2

## 2021-08-28 MED ORDER — BUPIVACAINE LIPOSOME 1.3 % IJ SUSP
INTRAMUSCULAR | Status: DC | PRN
  Administered 2021-08-28: 10 mL via PERINEURAL

## 2021-08-28 MED ORDER — MIDAZOLAM HCL 2 MG/2ML IJ SOLN
INTRAMUSCULAR | Status: AC
  Filled 2021-08-28: qty 2

## 2021-08-28 MED ORDER — PROPOFOL 10 MG/ML IV BOLUS
INTRAVENOUS | Status: DC | PRN
  Administered 2021-08-28: 140 mg via INTRAVENOUS

## 2021-08-28 MED ORDER — ONDANSETRON HCL 4 MG/2ML IJ SOLN
INTRAMUSCULAR | Status: DC | PRN
  Administered 2021-08-28: 4 mg via INTRAVENOUS

## 2021-08-28 SURGICAL SUPPLY — 67 items
ANCHOR NDL 9/16 CIR SZ 8 (NEEDLE) IMPLANT
ANCHOR NEEDLE 9/16 CIR SZ 8 (NEEDLE) IMPLANT
BAG COUNTER SPONGE SURGICOUNT (BAG) IMPLANT
BLADE EXCALIBUR 4.0X13 (MISCELLANEOUS) ×2 IMPLANT
BLADE SURG SZ11 CARB STEEL (BLADE) ×2 IMPLANT
BURR OVAL 8 FLU 4.0X13 (MISCELLANEOUS) IMPLANT
CANNULA ACUFO 5X76 (CANNULA) ×2 IMPLANT
CLEANER TIP ELECTROSURG 2X2 (MISCELLANEOUS) IMPLANT
COVER SURGICAL LIGHT HANDLE (MISCELLANEOUS) ×2 IMPLANT
DISSECTOR  3.8MM X 13CM (MISCELLANEOUS)
DISSECTOR 3.5MM X 13CM (MISCELLANEOUS) IMPLANT
DISSECTOR 3.8MM X 13CM (MISCELLANEOUS) IMPLANT
DRAPE IMP U-DRAPE 54X76 (DRAPES) ×2 IMPLANT
DRAPE INCISE IOBAN 66X45 STRL (DRAPES) ×2 IMPLANT
DRAPE ORTHO SPLIT 77X108 STRL (DRAPES) ×2
DRAPE POUCH INSTRU U-SHP 10X18 (DRAPES) ×2 IMPLANT
DRAPE STERI 35X30 U-POUCH (DRAPES) ×2 IMPLANT
DRAPE SURG ORHT 6 SPLT 77X108 (DRAPES) ×2 IMPLANT
DRSG AQUACEL AG ADV 3.5X 4 (GAUZE/BANDAGES/DRESSINGS) IMPLANT
DRSG AQUACEL AG ADV 3.5X 6 (GAUZE/BANDAGES/DRESSINGS) IMPLANT
DRSG PAD ABDOMINAL 8X10 ST (GAUZE/BANDAGES/DRESSINGS) ×1 IMPLANT
DURAPREP 26ML APPLICATOR (WOUND CARE) ×2 IMPLANT
ELECT NDL TIP 2.8 STRL (NEEDLE) ×1 IMPLANT
ELECT NEEDLE TIP 2.8 STRL (NEEDLE) ×2 IMPLANT
ELECT REM PT RETURN 15FT ADLT (MISCELLANEOUS) ×2 IMPLANT
FILTER STRAW (MISCELLANEOUS) ×2 IMPLANT
GAUZE SPONGE 4X4 12PLY STRL (GAUZE/BANDAGES/DRESSINGS) ×1 IMPLANT
GLOVE SRG 8 PF TXTR STRL LF DI (GLOVE) ×1 IMPLANT
GLOVE SURG POLYISO LF SZ7.5 (GLOVE) ×4 IMPLANT
GLOVE SURG POLYISO LF SZ8 (GLOVE) ×4 IMPLANT
GLOVE SURG UNDER POLY LF SZ7.5 (GLOVE) ×2 IMPLANT
GLOVE SURG UNDER POLY LF SZ8 (GLOVE) ×1
GOWN STRL REUS W/TWL XL LVL3 (GOWN DISPOSABLE) ×4 IMPLANT
KIT BASIN OR (CUSTOM PROCEDURE TRAY) ×2 IMPLANT
KIT TURNOVER KIT A (KITS) IMPLANT
MANIFOLD NEPTUNE II (INSTRUMENTS) ×2 IMPLANT
NDL SCORPION MULTI FIRE (NEEDLE) IMPLANT
NDL SPNL 18GX3.5 QUINCKE PK (NEEDLE) ×1 IMPLANT
NEEDLE SCORPION MULTI FIRE (NEEDLE) IMPLANT
NEEDLE SPNL 18GX3.5 QUINCKE PK (NEEDLE) ×2 IMPLANT
PACK SHOULDER (CUSTOM PROCEDURE TRAY) ×2 IMPLANT
PORT APPOLLO RF 90DEGREE MULTI (SURGICAL WAND) ×2 IMPLANT
PROTECTOR NERVE ULNAR (MISCELLANEOUS) ×2 IMPLANT
RESTRAINT HEAD UNIVERSAL NS (MISCELLANEOUS) IMPLANT
SLING ARM IMMOBILIZER LRG (SOFTGOODS) ×1 IMPLANT
SLING ARM IMMOBILIZER MED (SOFTGOODS) IMPLANT
SLING ULTRA II L (ORTHOPEDIC SUPPLIES) IMPLANT
STRIP CLOSURE SKIN 1/2X4 (GAUZE/BANDAGES/DRESSINGS) IMPLANT
SUCTION FRAZIER HANDLE 12FR (TUBING) ×1
SUCTION TUBE FRAZIER 12FR DISP (TUBING) ×1 IMPLANT
SUT ETHIBOND NAB CT1 #1 30IN (SUTURE) IMPLANT
SUT ETHILON 4 0 PS 2 18 (SUTURE) ×2 IMPLANT
SUT FIBERWIRE #2 38 T-5 BLUE (SUTURE)
SUT PROLENE 3 0 PS 2 (SUTURE) ×2 IMPLANT
SUT TIGER TAPE 7 IN WHITE (SUTURE) IMPLANT
SUT VIC AB 0 CT1 27 (SUTURE) ×1
SUT VIC AB 0 CT1 27XBRD ANTBC (SUTURE) ×1 IMPLANT
SUT VIC AB 1-0 CT2 27 (SUTURE) IMPLANT
SUT VIC AB 2-0 CT2 27 (SUTURE) IMPLANT
SUT VICRYL 0 UR6 27IN ABS (SUTURE) ×2 IMPLANT
SUTURE FIBERWR #2 38 T-5 BLUE (SUTURE) IMPLANT
SYR 20ML LL LF (SYRINGE) ×2 IMPLANT
TAPE FIBER 2MM 7IN #2 BLUE (SUTURE) IMPLANT
TOWEL OR 17X26 10 PK STRL BLUE (TOWEL DISPOSABLE) ×2 IMPLANT
TUBING ARTHROSCOPY IRRIG 16FT (MISCELLANEOUS) ×2 IMPLANT
TUBING CONNECTING 10 (TUBING) ×4 IMPLANT
WIPE CHG CHLORHEXIDINE 2% (PERSONAL CARE ITEMS) ×2 IMPLANT

## 2021-08-28 NOTE — Anesthesia Procedure Notes (Signed)
Procedure Name: Intubation ?Date/Time: 08/28/2021 10:43 AM ?Performed by: Deliah Boston, CRNA ?Pre-anesthesia Checklist: Patient identified, Emergency Drugs available, Suction available and Patient being monitored ?Patient Re-evaluated:Patient Re-evaluated prior to induction ?Oxygen Delivery Method: Circle system utilized ?Preoxygenation: Pre-oxygenation with 100% oxygen ?Induction Type: IV induction ?Ventilation: Mask ventilation without difficulty ?Laryngoscope Size: Mac and 4 ?Grade View: Grade I ?Tube type: Oral ?Tube size: 7.0 mm ?Number of attempts: 1 ?Airway Equipment and Method: Stylet and Oral airway ?Placement Confirmation: ETT inserted through vocal cords under direct vision, positive ETCO2 and breath sounds checked- equal and bilateral ?Secured at: 21 cm ?Tube secured with: Tape ?Dental Injury: Teeth and Oropharynx as per pre-operative assessment  ? ? ? ? ?

## 2021-08-28 NOTE — Interval H&P Note (Signed)
History and Physical Interval Note: ? ?08/28/2021 ?8:35 AM ? ?Shannon [name]  has presented today for surgery, with the diagnosis of Right shoulder impingement syndrome.  The various methods of treatment have been discussed with the patient and family. After consideration of risks, benefits and other options for treatment, the patient has consented to  Procedure(s): ?SHOULDER ARTHROSCOPY WITH SUBACROMIAL DECOMPRESSION AND DEBRIDEMENT (Right) as a surgical intervention.  The patient's history has been reviewed, patient examined, no change in status, stable for surgery.  I have reviewed the patient's chart and labs.  Questions were answered to the patient's satisfaction.   ? ? ?Lindsie Simar C Dollene Mallery ? ? ?

## 2021-08-28 NOTE — Anesthesia Preprocedure Evaluation (Addendum)
Anesthesia Evaluation  ?Patient identified by MRN, date of birth, ID band ?Patient awake ? ? ? ?Reviewed: ?Allergy & Precautions, NPO status , Patient's Chart, lab work & pertinent test results ? ?Airway ?Mallampati: II ? ?TM Distance: >3 FB ?Neck ROM: Full ? ? ? Dental ? ?(+) Teeth Intact, Dental Advisory Given ?  ?Pulmonary ?asthma , sleep apnea , former smoker,  ?  ?breath sounds clear to auscultation ? ? ? ? ? ? Cardiovascular ?hypertension, Pt. on home beta blockers and Pt. on medications ? ?Rhythm:Regular Rate:Normal ? ?Echo: ?- Left ventricle: The cavity size was normal. Wall thickness was  ???normal. Systolic function was normal. The estimated ejection  ???fraction was in the range of 55% to 60%. Wall motion was normal;  ???there were no regional wall motion abnormalities. Features are  ???consistent with a pseudonormal left ventricular filling pattern,  ???with concomitant abnormal relaxation and increased filling  ???pressure (grade 2 diastolic dysfunction).  ?- Mitral valve: There was mild regurgitation.  ?- Left atrium: The atrium was mildly dilated.  ?- Right atrium: The atrium was mildly dilated.  ?- Pulmonary arteries: Systolic pressure was mildly increased. PA  ???peak pressure: 35 mm Hg (S).  ?  ?Neuro/Psych ?PSYCHIATRIC DISORDERS Anxiety Depression   ? GI/Hepatic ?Neg liver ROS, GERD  Medicated,  ?Endo/Other  ?negative endocrine ROS ? Renal/GU ?negative Renal ROS  ? ?  ?Musculoskeletal ? ?(+) Arthritis , Rheumatoid disorders,   ? Abdominal ?Normal abdominal exam  (+)   ?Peds ? Hematology ?  ?Anesthesia Other Findings ? ? Reproductive/Obstetrics ? ?  ? ? ? ? ? ? ? ? ? ? ? ? ? ?  ?  ? ? ? ? ? ? ? ?Anesthesia Physical ?Anesthesia Plan ? ?ASA: 2 ? ?Anesthesia Plan: General  ? ?Post-op Pain Management: Regional block*  ? ?Induction: Intravenous ? ?PONV Risk Score and Plan: 4 or greater and Ondansetron, Dexamethasone, Midazolam and Scopolamine patch - Pre-op ? ?Airway  Management Planned: Oral ETT ? ?Additional Equipment: None ? ?Intra-op Plan:  ? ?Post-operative Plan: Extubation in OR ? ?Informed Consent: I have reviewed the patients History and Physical, chart, labs and discussed the procedure including the risks, benefits and alternatives for the proposed anesthesia with the patient or authorized representative who has indicated his/her understanding and acceptance.  ? ? ? ?Dental advisory given ? ?Plan Discussed with: CRNA ? ?Anesthesia Plan Comments:   ? ? ? ? ? ?Anesthesia Quick Evaluation ? ?

## 2021-08-28 NOTE — Progress Notes (Signed)
Patient ID: Shannon [name], female   DOB: 1970/07/27, 51 y.o.   MRN: [id number] ? ?Patient is unable to take aspirin for DVT prophylaxis.  She is an outpatient.  She has an upper extremity block which may take 1 to 2 days to resolve. ?We will send home a prescription for Eliquis prophylaxis 2.5 mg twice daily for 2 days should be sufficient. ?

## 2021-08-28 NOTE — Brief Op Note (Signed)
08/28/2021 ? ?11:45 AM ? ?PATIENT:  Shannon [name]  51 y.o. female ? ?PRE-OPERATIVE DIAGNOSIS:  Right shoulder impingement syndrome ? ?POST-OPERATIVE DIAGNOSIS:  Right shoulder impingement syndrome ? ?PROCEDURE:  Procedure(s): ?SHOULDER ARTHROSCOPY WITH SUBACROMIAL DECOMPRESSION AND DEBRIDEMENT (Right) ?DIAGNOSES: ?Right shoulder, subacromial impingement. ? ?POST-OPERATIVE DIAGNOSIS:  partial tear supraspinatus  , ?Anerior superior labral tear.Impingent syndrome,Bursitis ? ?PROCEDURE: ?Arthroscopic extensive debridement - 29823 Subdeltoid Bursa, Supraspinatus Tendon, Anterior Labrum, and Superior Labrum ?Acromioplasty,release CA ligament, ?  ?OPERATIVE FINDING: ?Exam under anesthesia: Normal ?Articular space: Normal ?Chondral surfaces: Normal ?Biceps: Normal ?Subscapularis: Intact . ?Supraspinatus: Incomplete tear 10% ?Infraspinatus: Intact .  ? ?SURGEON:  Surgeon(s) and Role: ?   Jene Every, MD - Primary ? ?PHYSICIAN ASSISTANT:  ? ?ASSISTANTS: [place]  ? ?ANESTHESIA:   general ? ?EBL:  min  ? ?BLOOD ADMINISTERED:none ? ?DRAINS: none  ? ?LOCAL MEDICATIONS USED:  MARCAINE    ? ?SPECIMEN:  No Specimen ? ?DISPOSITION OF SPECIMEN:  N/A ? ?COUNTS:  YES ? ?TOURNIQUET:  * No tourniquets in log * ? ?DICTATION: .Other Dictation: Dictation Number   [id number] ? ?PLAN OF CARE: Discharge to home after PACU ? ?PATIENT DISPOSITION:  PACU - hemodynamically stable. ?  ?Delay start of Pharmacological VTE agent (>24hrs) due to surgical blood loss or risk of bleeding: no ? ?

## 2021-08-28 NOTE — Transfer of Care (Signed)
Immediate Anesthesia Transfer of Care Note ? ?Patient: Shannon BosKaren A [name] ? ?Procedure(s) Performed: Procedure(s): ?SHOULDER ARTHROSCOPY WITH SUBACROMIAL DECOMPRESSION AND DEBRIDEMENT (Right) ? ?Patient Location: PACU ? ?Anesthesia Type:General and regional ? ?Level of Consciousness: Patient easily awoken, sedated, comfortable, cooperative, following commands, responds to stimulation.  ? ?Airway & Oxygen Therapy: Patient spontaneously breathing, ventilating well, oxygen via simple oxygen mask. ? ?Post-op Assessment: Report given to PACU RN, vital signs reviewed and stable, moving all extremities.  ? ?Post vital signs: Reviewed and stable. ? ?Complications: No apparent anesthesia complications ?Last Vitals:  ?Vitals Value Taken Time  ?BP 154/95 08/28/21 1223  ?Temp    ?Pulse 105 08/28/21 1223  ?Resp 17 08/28/21 1223  ?SpO2 100 % 08/28/21 1223  ?Vitals shown include unvalidated device data. ? ?Last Pain:  ?Vitals:  ? 08/28/21 0917  ?TempSrc:   ?PainSc: 0-No pain  ?   ? ?  ? ?Complications: No notable events documented. ?

## 2021-08-28 NOTE — Interval H&P Note (Signed)
History and Physical Interval Note: ? ?08/28/2021 ?9:58 AM ? ?Shannon [name]  has presented today for surgery, with the diagnosis of Right shoulder impingement syndrome.  The various methods of treatment have been discussed with the patient and family. After consideration of risks, benefits and other options for treatment, the patient has consented to  Procedure(s): ?SHOULDER ARTHROSCOPY WITH SUBACROMIAL DECOMPRESSION AND DEBRIDEMENT (Right) as a surgical intervention.  The patient's history has been reviewed, patient examined, no change in status, stable for surgery.  I have reviewed the patient's chart and labs.  Questions were answered to the patient's satisfaction.   ? ? ?[name] C [name] ? ? ?

## 2021-08-28 NOTE — Interval H&P Note (Signed)
History and Physical Interval Note: ? ?08/28/2021 ?8:35 AM ? ?Shannon [name]  has presented today for surgery, with the diagnosis of Right shoulder impingement syndrome.  The various methods of treatment have been discussed with the patient and family. After consideration of risks, benefits and other options for treatment, the patient has consented to  Procedure(s): ?SHOULDER ARTHROSCOPY WITH SUBACROMIAL DECOMPRESSION AND DEBRIDEMENT (Right) as a surgical intervention.  The patient's history has been reviewed, patient examined, no change in status, stable for surgery.  I have reviewed the patient's chart and labs.  Questions were answered to the patient's satisfaction.   ? ? ?Johnn Hai ? ? ?

## 2021-08-28 NOTE — Interval H&P Note (Signed)
History and Physical Interval Note: ? ?08/28/2021 ?9:58 AM ? ?Shannon [name]  has presented today for surgery, with the diagnosis of Right shoulder impingement syndrome.  The various methods of treatment have been discussed with the patient and family. After consideration of risks, benefits and other options for treatment, the patient has consented to  Procedure(s): ?SHOULDER ARTHROSCOPY WITH SUBACROMIAL DECOMPRESSION AND DEBRIDEMENT (Right) as a surgical intervention.  The patient's history has been reviewed, patient examined, no change in status, stable for surgery.  I have reviewed the patient's chart and labs.  Questions were answered to the patient's satisfaction.   ? ? ?Johnn Hai ? ? ?

## 2021-08-28 NOTE — Progress Notes (Signed)
AssistedDr. Hollis with right, ultrasound guided, interscalene  block. Side rails up, monitors on throughout procedure. See vital signs in flow sheet. Tolerated Procedure well.  

## 2021-08-28 NOTE — Anesthesia Procedure Notes (Signed)
Anesthesia Regional Block: Interscalene brachial plexus block  ? ?Pre-Anesthetic Checklist: , timeout performed,  Correct Patient, Correct Site, Correct Laterality,  Correct Procedure, Correct Position, site marked,  Risks and benefits discussed,  Surgical consent,  Pre-op evaluation,  At surgeon's request and post-op pain management ? ?Laterality: Right ? ?Prep: chloraprep     ?  ?Needles:  ?Injection technique: Single-shot ? ?Needle Type: Echogenic Stimulator Needle   ? ? ?Needle Length: 9cm  ?Needle Gauge: 21  ? ? ? ?Additional Needles: ? ? ?Procedures:,,,, ultrasound used (permanent image in chart),,    ?Narrative:  ?Start time: 08/28/2021 9:10 AM ?End time: 08/28/2021 9:15 AM ?Injection made incrementally with aspirations every 5 mL. ? ?Performed by: Personally  ?Anesthesiologist: Shelton Silvas, MD ? ?Additional Notes: ?Patient tolerated the procedure well. Local anesthetic introduced in an incremental fashion under minimal resistance after negative aspirations. No paresthesias were elicited. After completion of the procedure, no acute issues were identified and patient continued to be monitored by RN.  ? ? ? ? ? ?

## 2021-08-29 ENCOUNTER — Encounter (HOSPITAL_COMMUNITY): Payer: Self-pay | Admitting: Specialist

## 2021-08-29 NOTE — Op Note (Signed)
NAME: [name], [name] ?MEDICAL RECORD NO: [id number] ?ACCOUNT NO: [id number] ?DATE OF BIRTH: Nov 27, 1970 ?FACILITY: [facility] ?LOCATION: [facility] ?PHYSICIAN: Javier DockerJeffrey C. Sheika Coutts, MD ? ?Operative Report  ? ?DATE OF PROCEDURE: 08/28/2021 ? ? ?PREOPERATIVE DIAGNOSIS:  Impingement syndrome of the right shoulder. ? ?POSTOPERATIVE DIAGNOSES:   ?1.  Impingement syndrome, right shoulder. ?2.  Partial tear of the rotator cuff. ?3.  Anterior superior labral tear. ? ?PROCEDURES:   ?1.  Right shoulder arthroscopy. ?2.  Subacromial decompression with acromioplasty and release of the CA ligament, bursectomy, subacromial and subdeltoid. ?3.  Debridement of partial tear of the rotator cuff 10%. ?4.  Anterior superior labral debridement. ? ?ANESTHESIA:  General with a regional block. ? ?ASSISTANT: Lanna PocheJacqueline Bissell, PA.  ? ?HISTORY:  A 51 year old with refractory impingement syndrome despite rest, activity modification, exercise program and 2 subacromial injections with temporary relief.  She was indicated for a subacromial decompression and bursectomy.  Evaluation of the  ?rotator cuff and the glenohumeral joint.  Risks and benefits discussed including bleeding, infection, damage to neurovascular structures, no change in symptoms, worsening symptoms, DVT, PE, anesthetic complications, etc. ? ?DESCRIPTION OF PROCEDURE:  With the patient in supine beach chair position, after induction of adequate general anesthesia, 2 grams Kefzol, the right shoulder and upper extremity was prepped and draped in the usual sterile fashion.  Timeout was  ?performed.  A surgical marker utilized to delineate the acromion, AC joint and coracoid.  Standard posterolateral portal was utilized and fashioned with #11 blade through skin only.  Anterolateral portal was also fashioned with a #11 blade.  Arthroscopic ? camera was then inserted in the subacromial space. Triangulated from the anterolateral portal in the subacromial space.  Inspection revealed hypertrophic  bursa throughout.  I introduced a shaver and performed a full bursectomy, subacromial, subdeltoid  ?anteriorly, posteriorly and laterally.  Following this, I noted in the supraspinatus near the attachment a mild fraying of the rotator cuff.  This was debrided with a shaver to good bleeding tissue, approximately 10% of the thickness was noted to be  ?involved.  There was hypertrophic CA ligament.  I used an ArthroWand to release the CA ligament.  I shaved a small spur off the anterolateral aspect of the acromion, performed acromioplasty. ? ?There was no impingement noted [facility].  Following this, I redirected the camera into the glenohumeral joint with the arm in the 70/30 position.  Gentle traction applied to it penetrating atraumatically.  Irrigant was utilized to insufflate the ? joint again at 45 mmHg.  Inspection revealed normal biceps tendon; however, there was anterior labral and superior labral tearing that was frayed and eroded; however, not displaced.  Under direct visualization, I placed an 18-gauge needle anteriorly  ?midway between the anterolateral aspect of the acromion coracoid and this advanced into the rotator interval, just beneath the biceps tendon under direct visualization, I made an anterior portal with a #11 blade.  I then advanced the cannula penetrating  ?the capsule atraumatically just beneath the biceps tendon.  I introduced a shaver and debrided the anterior and superior labrum.  The remainder of the biceps tendon was intact.  There is no evidence of rotator cuff.  The glenoid and humerus were  ?unremarkable.  The subscap was unremarkable. ? ?I then used electrocautery to ensure hemostasis.  No active bleeding was noted.  I removed all instrumentation.  I closed the portals with 4-0 nylon simple sutures.  0.25% Marcaine with epinephrine was infiltrated in joint.  Wound was  dressed sterilely, placed  ?in a sling, extubated without difficulty and transported to the recovery room  in satisfactory condition. ? ?The patient tolerated the procedure well.  No complications.   ? ?ASSISTANT: Lanna Poche, PA. was used in the case due to the patient's elevated BMI and her large arm to provide traction and manage inflow and outflow.   ? ? ? ?SUJ ?D: 08/28/2021 11:52:44 am T: 08/29/2021 12:31:00 am  ?JOB: [id number]/ [id number]  ?

## 2021-08-29 NOTE — Anesthesia Postprocedure Evaluation (Signed)
Anesthesia Post Note ? ?Patient: Shannon [name] ? ?Procedure(s) Performed: SHOULDER ARTHROSCOPY WITH SUBACROMIAL DECOMPRESSION AND DEBRIDEMENT (Right) ? ?  ? ?Patient location during evaluation: PACU ?Anesthesia Type: General ?Level of consciousness: awake and alert ?Pain management: pain level controlled ?Vital Signs Assessment: post-procedure vital signs reviewed and stable ?Respiratory status: spontaneous breathing, nonlabored ventilation, respiratory function stable and patient connected to nasal cannula oxygen ?Cardiovascular status: blood pressure returned to baseline and stable ?Postop Assessment: no apparent nausea or vomiting ?Anesthetic complications: no ? ? ?No notable events documented. ? ?Last Vitals:  ?Vitals:  ? 08/28/21 1445 08/28/21 1508  ?BP: (!) 161/81   ?Pulse: 93 88  ?Resp: 16   ?Temp: 36.6 ?C   ?SpO2: 90% 97%  ?  ?Last Pain:  ?Vitals:  ? 08/28/21 1345  ?TempSrc:   ?PainSc: 0-No pain  ? ? ?  ?  ?  ?  ?  ?  ? ?Effie Berkshire ? ? ? ? ?

## 2021-09-12 DIAGNOSIS — Z96652 Presence of left artificial knee joint: Secondary | ICD-10-CM | POA: Diagnosis not present

## 2021-09-12 DIAGNOSIS — M329 Systemic lupus erythematosus, unspecified: Secondary | ICD-10-CM | POA: Diagnosis not present

## 2021-09-12 DIAGNOSIS — M25561 Pain in right knee: Secondary | ICD-10-CM | POA: Diagnosis not present

## 2021-09-12 DIAGNOSIS — J849 Interstitial pulmonary disease, unspecified: Secondary | ICD-10-CM | POA: Diagnosis not present

## 2021-09-12 DIAGNOSIS — G894 Chronic pain syndrome: Secondary | ICD-10-CM | POA: Diagnosis not present

## 2021-09-12 DIAGNOSIS — Z96651 Presence of right artificial knee joint: Secondary | ICD-10-CM | POA: Diagnosis not present

## 2021-09-12 DIAGNOSIS — M1711 Unilateral primary osteoarthritis, right knee: Secondary | ICD-10-CM | POA: Diagnosis not present

## 2021-09-12 DIAGNOSIS — Z79891 Long term (current) use of opiate analgesic: Secondary | ICD-10-CM | POA: Diagnosis not present

## 2021-09-16 DIAGNOSIS — M25561 Pain in right knee: Secondary | ICD-10-CM | POA: Diagnosis not present

## 2021-09-16 DIAGNOSIS — Z1389 Encounter for screening for other disorder: Secondary | ICD-10-CM | POA: Diagnosis not present

## 2021-09-16 DIAGNOSIS — M47816 Spondylosis without myelopathy or radiculopathy, lumbar region: Secondary | ICD-10-CM | POA: Diagnosis not present

## 2021-09-16 DIAGNOSIS — Z96651 Presence of right artificial knee joint: Secondary | ICD-10-CM | POA: Diagnosis not present

## 2021-09-16 DIAGNOSIS — Z79891 Long term (current) use of opiate analgesic: Secondary | ICD-10-CM | POA: Diagnosis not present

## 2021-09-16 DIAGNOSIS — M1711 Unilateral primary osteoarthritis, right knee: Secondary | ICD-10-CM | POA: Diagnosis not present

## 2021-09-16 DIAGNOSIS — E669 Obesity, unspecified: Secondary | ICD-10-CM | POA: Diagnosis not present

## 2021-09-16 DIAGNOSIS — Z96652 Presence of left artificial knee joint: Secondary | ICD-10-CM | POA: Diagnosis not present

## 2021-09-19 DIAGNOSIS — M25511 Pain in right shoulder: Secondary | ICD-10-CM | POA: Diagnosis not present

## 2021-09-24 DIAGNOSIS — M25511 Pain in right shoulder: Secondary | ICD-10-CM | POA: Diagnosis not present

## 2021-09-25 DIAGNOSIS — M5137 Other intervertebral disc degeneration, lumbosacral region: Secondary | ICD-10-CM | POA: Insufficient documentation

## 2021-09-26 DIAGNOSIS — Z96651 Presence of right artificial knee joint: Secondary | ICD-10-CM | POA: Diagnosis not present

## 2021-09-26 DIAGNOSIS — M25511 Pain in right shoulder: Secondary | ICD-10-CM | POA: Diagnosis not present

## 2021-09-26 DIAGNOSIS — G894 Chronic pain syndrome: Secondary | ICD-10-CM | POA: Diagnosis not present

## 2021-09-26 DIAGNOSIS — M329 Systemic lupus erythematosus, unspecified: Secondary | ICD-10-CM | POA: Diagnosis not present

## 2021-09-26 DIAGNOSIS — Z96652 Presence of left artificial knee joint: Secondary | ICD-10-CM | POA: Diagnosis not present

## 2021-09-26 DIAGNOSIS — M25561 Pain in right knee: Secondary | ICD-10-CM | POA: Diagnosis not present

## 2021-09-26 DIAGNOSIS — M1711 Unilateral primary osteoarthritis, right knee: Secondary | ICD-10-CM | POA: Diagnosis not present

## 2021-09-26 DIAGNOSIS — Z79891 Long term (current) use of opiate analgesic: Secondary | ICD-10-CM | POA: Diagnosis not present

## 2021-09-26 DIAGNOSIS — J849 Interstitial pulmonary disease, unspecified: Secondary | ICD-10-CM | POA: Diagnosis not present

## 2021-09-27 ENCOUNTER — Ambulatory Visit (INDEPENDENT_AMBULATORY_CARE_PROVIDER_SITE_OTHER): Payer: Medicare HMO | Admitting: Pulmonary Disease

## 2021-09-27 ENCOUNTER — Encounter: Payer: Self-pay | Admitting: Pulmonary Disease

## 2021-09-27 VITALS — BP 116/82 | HR 104 | Temp 98.6°F | Ht 71.0 in | Wt 300.8 lb

## 2021-09-27 DIAGNOSIS — Z9989 Dependence on other enabling machines and devices: Secondary | ICD-10-CM | POA: Diagnosis not present

## 2021-09-27 DIAGNOSIS — G4733 Obstructive sleep apnea (adult) (pediatric): Secondary | ICD-10-CM

## 2021-09-27 NOTE — Patient Instructions (Addendum)
Prescription for CPAP supplies ? ?[facility] CPAP, contact person Shawna Orleans ? ?Restart using CPAP on a nightly basis ? ?Call us if you have any significant concerns ? ?Follow-up in 3 months ? ?Continue weight loss efforts ? ?We need to have you using it for few weeks so that we can get some data from the machine to help [place] determine if any changes need to be made ?

## 2021-09-27 NOTE — Progress Notes (Signed)
? ?      ?Shannon [name]    [id number]    07/02/1970 ? ?Primary Care Physician:[name], Edgardo RoysGreta, PA-C ? ?Referring Physician: Eunice Blase'Buch, [name], PA-C ?[address] ?[address] ?[place],  [place] [id number] ? ?Chief complaint:   ?Patient with obstructive sleep apnea ?She got away from using CPAP regularly ? ?HPI: ? ?She was having some difficulty tolerating mask and over time stopped using her CPAP ? ?She now is having recurrence of symptoms including daytime fatigue, daytime sleepiness ?Nonrestorative sleep ? ?She was on auto CPAP 5-15 but was having difficulty with the mask, sometimes takes the mask off ?She is willing to get back into using CPAP but wants to try a nose mask ? ?No significant change in her health ?No new medications ?Follows up with Dr. Marchelle Gearingamaswamy for interstitial lung disease, on CellCept ? ?Usually goes to bed about 930 to 10 PM, wakes up at 8 AM ?About 2-3 awakenings admits to dry mouth in the mornings ?Admits to headaches in the mornings ? ? ?History of interstitial lung disease, lupus, follows up with Dr. Marchelle Gearingamaswamy ? ?She is on Breo 100 ?-Feels Virgel BouquetBreo is working okay ?Reformed smoker quit in 2016 ? ?Recent history of snoring, noted to be loud ?Spouse has obstructive sleep apnea-she is aware of some symptoms of sleep disordered breathing ? 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 ?No family history of obstructive sleep apnea ? ?Patient has a history of interstitial lung disease, lupus, rheumatoid arthritis ? ? ?Outpatient Encounter Medications as of 09/27/2021  ?Medication Sig  ? albuterol (VENTOLIN HFA) 108 (90 Base) MCG/ACT inhaler Inhale 2 puffs into the lungs every 6 (six) hours as needed for wheezing or shortness of breath. (Patient taking differently: Inhale 1 puff into the lungs every 4 (four) hours as needed for wheezing or shortness of breath.)  ? buPROPion (WELLBUTRIN XL) 150 MG 24 hr tablet Take 150 mg by mouth  daily.   ? busPIRone (BUSPAR) 15 MG tablet Take 15 mg by mouth daily.  ? carvedilol (COREG) 6.25 MG tablet Take 6.25 mg by mouth 2 (two) times daily.  ? famotidine (PEPCID) 20 MG tablet Take 20 mg by mouth at bedtime.  ? fluticasone (FLONASE) 50 MCG/ACT nasal spray Place 2 sprays into both nostrils daily.  ? fluticasone furoate-vilanterol (BREO ELLIPTA) 200-25 MCG/ACT AEPB Inhale 1 puff by mouth once daily.  ? hydroxychloroquine (PLAQUENIL) 200 MG tablet Take 400 mg by mouth daily.   ? lisinopril (ZESTRIL) 10 MG tablet Take 10 mg by mouth daily.  ? methocarbamol (ROBAXIN) 500 MG tablet Take 1 tablet (500 mg total) by mouth every 8 (eight) hours as needed for muscle spasms.  ? mycophenolate (CELLCEPT) 500 MG tablet Take 2 tablets (1,000 mg total) by mouth 2 (two) times daily.  ? Oxycodone HCl 10 MG TABS Take 10 mg by mouth in the morning, at noon, in the evening, and at bedtime.  ? QUEtiapine (SEROQUEL) 200 MG tablet Take 200 mg by mouth at bedtime.   ? sertraline (ZOLOFT) 100 MG tablet Take 200 mg by mouth at bedtime.  ? [DISCONTINUED] apixaban (ELIQUIS) 2.5 MG TABS tablet Take 1 tablet (2.5 mg total) by mouth 2 (two) times daily for 2 days.  ? [DISCONTINUED] docusate sodium (COLACE) 100 MG capsule Take 1 capsule (100 mg total) by mouth 2 (two) times daily as needed for mild constipation.  ? [DISCONTINUED] fluticasone  furoate-vilanterol (BREO ELLIPTA) 100-25 MCG/ACT AEPB Inhale 1 puff into the lungs daily.  ? ?No facility-administered encounter medications on file as of 09/27/2021.  ? ? ?Allergies as of 09/27/2021 - Review Complete 09/27/2021  ?Allergen Reaction Noted  ? Asa [aspirin] Hives and Shortness Of Breath   ? Penicillins Hives 12/18/2015  ? Sulfa antibiotics    ? ? ?Past Medical History:  ?Diagnosis Date  ? Anemia   ? Anxiety   ? Depression   ? Dyspnea   ? Sometimes at rest  ? G6PD deficiency   ? GERD (gastroesophageal reflux disease)   ? Hypertension   ? ILD (interstitial lung disease) (HCC)   ? Lupus (HCC)    ? Pneumonia   ? Rheumatoid arthritis (HCC)   ? Sleep apnea   ? ? ?Past Surgical History:  ?Procedure Laterality Date  ? ABDOMINAL HYSTERECTOMY    ? APPENDECTOMY  1980  ? cardiac catherization  08/20/2017  ? CARDIAC CATHETERIZATION N/A 05/10/2015  ? Procedure: Right Heart Cath;  Surgeon: Laurey Morale, MD;  Location: [facility] INVASIVE CV LAB;  Service: Cardiovascular;  Laterality: N/A;  ? CESAREAN SECTION  '95, '02, '07  ? X 3  ? CHOLECYSTECTOMY  2010  ? LAPAROSCOPIC HYSTERECTOMY  10/2015  ? have ovaries  ? RIGHT HEART CATH N/A 08/20/2017  ? Procedure: RIGHT HEART CATH;  Surgeon: Laurey Morale, MD;  Location: [facility] INVASIVE CV LAB;  Service: Cardiovascular;  Laterality: N/A;  ? SHOULDER ARTHROSCOPY WITH SUBACROMIAL DECOMPRESSION Right 08/28/2021  ? Procedure: SHOULDER ARTHROSCOPY WITH SUBACROMIAL DECOMPRESSION AND DEBRIDEMENT;  Surgeon: Jene Every, MD;  Location: [facility];  Service: Orthopedics;  Laterality: Right;  ? TOTAL KNEE ARTHROPLASTY Left 04/04/2019  ? Procedure: TOTAL KNEE ARTHROPLASTY;  Surgeon: Ollen Gross, MD;  Location: [facility];  Service: Orthopedics;  Laterality: Left;   ? TOTAL KNEE ARTHROPLASTY Right 04/30/2020  ? Procedure: TOTAL KNEE ARTHROPLASTY;  Surgeon: Ollen Gross, MD;  Location: [facility];  Service: Orthopedics;  Laterality: Right;   ? TUBAL LIGATION    ? ? ?Family History  ?Problem Relation Age of Onset  ? Sarcoidosis Mother   ? Lupus Sister   ? Healthy Daughter   ? Healthy Son   ? Healthy Son   ? ? ?Social History  ? ?Socioeconomic History  ? Marital status: Married  ?  Spouse name: [name]  ? Number of children: 3  ? Years of education: 2  ? Highest education level: Not on file  ?Occupational History  ? Occupation: Glass blower/designer  ?  Employer: [employer]  ?  Comment: 12/19/15 applying for SS  ?Tobacco Use  ? Smoking status: Former  ?  Packs/day: 0.50  ?  Years: 14.00  ?  Pack years: 7.00  ?  Types: Cigarettes  ?  Quit date: 09/25/2014  ?  Years since quitting: 7.0  ? Smokeless tobacco:  Never  ?Vaping Use  ? Vaping Use: Never used  ?Substance and Sexual Activity  ? Alcohol use: Yes  ?  Alcohol/week: 0.0 standard drinks  ?  Comment: occas.  ? Drug use: No  ? Sexual activity: Not on file  ?  Comment: Hysterectomy  ?Other Topics Concern  ? Not on file  ?Social History Narrative  ? Married, lives with husband, children  ? Caffeine- coffee  1 daily  ? ?Social Determinants of Health  ? ?Financial Resource Strain: Not on file  ?Food Insecurity: Not on file  ?Transportation Needs: Not on file  ?Physical Activity: Not on  file  ?Stress: Not on file  ?Social Connections: Not on file  ?Intimate Partner Violence: Not on file  ? ? ?Review of Systems  ?Constitutional:  Positive for fatigue.  ?Respiratory:  Positive for apnea and cough. Negative for shortness of breath.   ?Psychiatric/Behavioral:  Positive for sleep disturbance.   ? ?Vitals:  ? 09/27/21 0855  ?BP: 116/82  ?Pulse: (!) 104  ?Temp: 98.6 ?F (37 ?C)  ?SpO2: 95%  ? ? ? ?Physical Exam ?Constitutional:   ?   Appearance: She is obese.  ?HENT:  ?   Head: Normocephalic and atraumatic.  ?   Nose: No congestion.  ?   Mouth/Throat:  ?   Mouth: Mucous membranes are moist.  ?   Comments: Mallampati 4, crowded oropharynx ?Eyes:  ?   Pupils: Pupils are equal, round, and reactive to light.  ?Cardiovascular:  ?   Rate and Rhythm: Normal rate and regular rhythm.  ?   Pulses: Normal pulses.  ?   Heart sounds: Normal heart sounds. No murmur heard. ?  No friction rub.  ?Pulmonary:  ?   Effort: Pulmonary effort is normal. No respiratory distress.  ?   Breath sounds: No stridor. Rales present. No wheezing or rhonchi.  ?Musculoskeletal:  ?   Cervical back: No rigidity or tenderness.  ?Neurological:  ?   Mental Status: She is alert.  ?Psychiatric:     ?   Mood and Affect: Mood normal.  ? ? ?  01/18/2020  ? 12:00 PM  ?Results of the Epworth flowsheet  ?Sitting and reading 1  ?Watching TV 2  ?Sitting, inactive in a public place (e.g. a theatre or a meeting) 0  ?As a passenger  in a car for an hour without a break 1  ?Lying down to rest in the afternoon when circumstances permit 1  ?Sitting and talking to someone 0  ?Sitting quietly after a lunch without alcohol 2  ?In a car, whil

## 2021-10-03 DIAGNOSIS — N3281 Overactive bladder: Secondary | ICD-10-CM | POA: Diagnosis not present

## 2021-10-03 DIAGNOSIS — R350 Frequency of micturition: Secondary | ICD-10-CM | POA: Diagnosis not present

## 2021-10-03 DIAGNOSIS — Z6841 Body Mass Index (BMI) 40.0 and over, adult: Secondary | ICD-10-CM | POA: Diagnosis not present

## 2021-10-04 DIAGNOSIS — M25511 Pain in right shoulder: Secondary | ICD-10-CM | POA: Diagnosis not present

## 2021-10-07 DIAGNOSIS — M25511 Pain in right shoulder: Secondary | ICD-10-CM | POA: Diagnosis not present

## 2021-10-07 DIAGNOSIS — M545 Low back pain, unspecified: Secondary | ICD-10-CM | POA: Diagnosis not present

## 2021-10-10 DIAGNOSIS — M25511 Pain in right shoulder: Secondary | ICD-10-CM | POA: Diagnosis not present

## 2021-10-13 DIAGNOSIS — M329 Systemic lupus erythematosus, unspecified: Secondary | ICD-10-CM | POA: Diagnosis not present

## 2021-10-13 DIAGNOSIS — Z79891 Long term (current) use of opiate analgesic: Secondary | ICD-10-CM | POA: Diagnosis not present

## 2021-10-13 DIAGNOSIS — M25561 Pain in right knee: Secondary | ICD-10-CM | POA: Diagnosis not present

## 2021-10-13 DIAGNOSIS — G894 Chronic pain syndrome: Secondary | ICD-10-CM | POA: Diagnosis not present

## 2021-10-13 DIAGNOSIS — M1711 Unilateral primary osteoarthritis, right knee: Secondary | ICD-10-CM | POA: Diagnosis not present

## 2021-10-13 DIAGNOSIS — Z96652 Presence of left artificial knee joint: Secondary | ICD-10-CM | POA: Diagnosis not present

## 2021-10-13 DIAGNOSIS — Z96651 Presence of right artificial knee joint: Secondary | ICD-10-CM | POA: Diagnosis not present

## 2021-10-13 DIAGNOSIS — J849 Interstitial pulmonary disease, unspecified: Secondary | ICD-10-CM | POA: Diagnosis not present

## 2021-10-15 DIAGNOSIS — M25511 Pain in right shoulder: Secondary | ICD-10-CM | POA: Diagnosis not present

## 2021-10-16 DIAGNOSIS — Z1389 Encounter for screening for other disorder: Secondary | ICD-10-CM | POA: Diagnosis not present

## 2021-10-16 DIAGNOSIS — M25561 Pain in right knee: Secondary | ICD-10-CM | POA: Diagnosis not present

## 2021-10-16 DIAGNOSIS — Z96651 Presence of right artificial knee joint: Secondary | ICD-10-CM | POA: Diagnosis not present

## 2021-10-16 DIAGNOSIS — M1711 Unilateral primary osteoarthritis, right knee: Secondary | ICD-10-CM | POA: Diagnosis not present

## 2021-10-16 DIAGNOSIS — M47816 Spondylosis without myelopathy or radiculopathy, lumbar region: Secondary | ICD-10-CM | POA: Diagnosis not present

## 2021-10-16 DIAGNOSIS — E669 Obesity, unspecified: Secondary | ICD-10-CM | POA: Diagnosis not present

## 2021-10-16 DIAGNOSIS — Z96652 Presence of left artificial knee joint: Secondary | ICD-10-CM | POA: Diagnosis not present

## 2021-10-16 DIAGNOSIS — Z79891 Long term (current) use of opiate analgesic: Secondary | ICD-10-CM | POA: Diagnosis not present

## 2021-10-17 DIAGNOSIS — M25511 Pain in right shoulder: Secondary | ICD-10-CM | POA: Diagnosis not present

## 2021-10-18 DIAGNOSIS — F332 Major depressive disorder, recurrent severe without psychotic features: Secondary | ICD-10-CM | POA: Diagnosis not present

## 2021-10-18 DIAGNOSIS — F411 Generalized anxiety disorder: Secondary | ICD-10-CM | POA: Diagnosis not present

## 2021-10-18 DIAGNOSIS — R69 Illness, unspecified: Secondary | ICD-10-CM | POA: Diagnosis not present

## 2021-10-18 DIAGNOSIS — M3210 Systemic lupus erythematosus, organ or system involvement unspecified: Secondary | ICD-10-CM | POA: Diagnosis not present

## 2021-10-18 DIAGNOSIS — Z79899 Other long term (current) drug therapy: Secondary | ICD-10-CM | POA: Diagnosis not present

## 2021-10-24 DIAGNOSIS — G4733 Obstructive sleep apnea (adult) (pediatric): Secondary | ICD-10-CM | POA: Diagnosis not present

## 2021-10-24 DIAGNOSIS — G4710 Hypersomnia, unspecified: Secondary | ICD-10-CM | POA: Diagnosis not present

## 2021-10-24 DIAGNOSIS — M5416 Radiculopathy, lumbar region: Secondary | ICD-10-CM | POA: Diagnosis not present

## 2021-10-25 DIAGNOSIS — I429 Cardiomyopathy, unspecified: Secondary | ICD-10-CM | POA: Diagnosis not present

## 2021-10-25 DIAGNOSIS — Z79899 Other long term (current) drug therapy: Secondary | ICD-10-CM | POA: Diagnosis not present

## 2021-10-25 DIAGNOSIS — M329 Systemic lupus erythematosus, unspecified: Secondary | ICD-10-CM | POA: Diagnosis not present

## 2021-10-25 DIAGNOSIS — I10 Essential (primary) hypertension: Secondary | ICD-10-CM | POA: Diagnosis not present

## 2021-10-25 DIAGNOSIS — N3281 Overactive bladder: Secondary | ICD-10-CM | POA: Diagnosis not present

## 2021-10-25 DIAGNOSIS — Z6841 Body Mass Index (BMI) 40.0 and over, adult: Secondary | ICD-10-CM | POA: Diagnosis not present

## 2021-10-26 DIAGNOSIS — Z96652 Presence of left artificial knee joint: Secondary | ICD-10-CM | POA: Diagnosis not present

## 2021-10-26 DIAGNOSIS — Z96651 Presence of right artificial knee joint: Secondary | ICD-10-CM | POA: Diagnosis not present

## 2021-10-26 DIAGNOSIS — Z79891 Long term (current) use of opiate analgesic: Secondary | ICD-10-CM | POA: Diagnosis not present

## 2021-10-26 DIAGNOSIS — M25561 Pain in right knee: Secondary | ICD-10-CM | POA: Diagnosis not present

## 2021-10-26 DIAGNOSIS — J849 Interstitial pulmonary disease, unspecified: Secondary | ICD-10-CM | POA: Diagnosis not present

## 2021-10-26 DIAGNOSIS — M1711 Unilateral primary osteoarthritis, right knee: Secondary | ICD-10-CM | POA: Diagnosis not present

## 2021-10-26 DIAGNOSIS — G894 Chronic pain syndrome: Secondary | ICD-10-CM | POA: Diagnosis not present

## 2021-10-26 DIAGNOSIS — M329 Systemic lupus erythematosus, unspecified: Secondary | ICD-10-CM | POA: Diagnosis not present

## 2021-10-30 ENCOUNTER — Ambulatory Visit
Admission: RE | Admit: 2021-10-30 | Discharge: 2021-10-30 | Disposition: A | Discharge status: Home / Self Care | Payer: Medicare HMO | Source: Ambulatory Visit | Attending: Emergency Medicine | Admitting: Emergency Medicine

## 2021-10-30 VITALS — BP 155/100 | HR 109 | Temp 99.6°F | Resp 20

## 2021-10-30 DIAGNOSIS — J849 Interstitial pulmonary disease, unspecified: Secondary | ICD-10-CM

## 2021-10-30 DIAGNOSIS — J45991 Cough variant asthma: Secondary | ICD-10-CM

## 2021-10-30 DIAGNOSIS — J441 Chronic obstructive pulmonary disease with (acute) exacerbation: Secondary | ICD-10-CM | POA: Diagnosis not present

## 2021-10-30 MED ORDER — DEXAMETHASONE SODIUM PHOSPHATE 10 MG/ML IJ SOLN
20.0000 mg | Freq: Once | INTRAMUSCULAR | Status: AC
  Administered 2021-10-30: 20 mg via INTRAMUSCULAR

## 2021-10-30 MED ORDER — METHYLPREDNISOLONE 4 MG PO TBPK: ORAL_TABLET | ORAL | 0 refills | Status: DC

## 2021-10-30 MED ORDER — LEVOFLOXACIN 500 MG PO TABS: 500.0000 mg | ORAL_TABLET | Freq: Every day | ORAL | 0 refills | Status: AC

## 2021-10-30 MED ORDER — PROMETHAZINE-DM 6.25-15 MG/5ML PO SYRP: 5.0000 mL | ORAL_SOLUTION | Freq: Four times a day (QID) | ORAL | 0 refills | Status: DC | PRN

## 2021-10-30 MED ORDER — CETIRIZINE HCL 10 MG PO TABS: 10.0000 mg | ORAL_TABLET | Freq: Every day | ORAL | 2 refills | Status: DC

## 2021-10-30 MED ORDER — MONTELUKAST SODIUM 10 MG PO TABS: 10.0000 mg | ORAL_TABLET | Freq: Every day | ORAL | 2 refills | Status: DC

## 2021-10-30 NOTE — Discharge Instructions (Addendum)
Your symptoms and my physical exam findings are concerning for exacerbation of your known interstitial lung disease versus exacerbation of your cough variant asthma.  Exacerbations can occur when your lower airway becomes inflamed due to viral infection, bacterial infection or allergy exposure or any combination of these. ?   ?Please see the list below for recommended medications, dosages and frequencies to provide relief of current symptoms:   ?  ?Decadron IM (dexamethasone):  To quickly address your significant respiratory inflammation, you were provided with an injection of Decadron in the office today.  You should continue to feel the full benefit of the steroid for the next 12-24 hours.  ? ?Medrol Dosepak (methylprednisolone): This is a steroid that will significantly calm your upper and lower airways, please take one row of tablets daily with your breakfast meal starting tomorrow morning until the prescription is complete.    ?  ?Levaquin (levofloxacin): This is a fluoroquinolone antibiotic that does not contain penicillin or sulfa.  Please take 1 tablet daily for prevention of pneumonia.  Please be sure you finish your doxycycline while you are taking the Levaquin. ? ?Zyrtec (cetirizine): This is an excellent second-generation antihistamine that helps to reduce respiratory inflammatory response to environmental allergens.  In some patients, this medication can cause daytime sleepiness so I recommend that you take 1 tablet daily at bedtime.   ?  ?Singulair (montelukast): This is a mast cell stabilizer that works well with antihistamines.  Mast cells are responsible for stimulating histamine production so you can imagine that if we can reduce the activity of your mast cells, then fewer histamines will be produced and inflammation caused by allergy exposure will be significantly reduced.  I recommend that you take this medication at the same time you take your antihistamine. ? ?ProAir, Ventolin, Proventil  (albuterol): This inhaled medication contains a short acting beta agonist bronchodilator.  This medication works on the smooth muscle that opens and constricts of your airways by relaxing the muscle.  The result of relaxation of the smooth muscle is increased air movement and improved work of breathing.  Please inhale 2 puffs 4 times daily on a scheduled basis and again every time you experience increased work of breathing, shortness of breath, wheezing and excessive coughing.   ? ?Breo (fluticasone and vilanterol): Please inhale 1 puff every day in the morning.  This inhaled medication contains a corticosteroid and long-acting form of albuterol.  The inhaled steroid and this medication  is not absorbed into the body and will not cause side effects such as increased blood sugar levels, irritability, sleeplessness or weight gain.  Inhaled corticosteroid are sort of like topical steroid creams but, as you can imagine, it is not practical to attempt to rub a steroid cream inside of your lungs.  The long-acting albuterol works similarly to the short acting albuterol found in your rescue inhaler but provides 24-hour relaxation of the smooth muscles that open and constrict your airways; your short acting rescue inhaler can only provide for a few hours this benefit for a few hours.  Please feel free to continue using your short acting rescue inhaler as often as needed throughout the day for shortness of breath, wheezing, and cough. ?  ?Not taking your inhaled medications as prescribed can increase your risk of more frequent upper respiratory infections, lower respiratory disorders, skin reactions, and eye irritations that may or may not require the use of antibiotics and steroids and can result in loss of time at work, celebrations with  family and friends as well as missed social opportunities. ?  ?Please follow-up within the next 5-7 days either with your primary care provider or urgent care if your symptoms do not  resolve.  If you do not have a primary care provider, we will assist you in finding one. ?  ? Thank you for visiting urgent care today.  We appreciate the opportunity to participate in your care. ? ? ?

## 2021-10-30 NOTE — ED Triage Notes (Signed)
Pt c/o SOB, cough, patient c/o chest discomfort and back discomfort. Pt was prescribed doxycycline that she is still currently taking but is not feeling better.  ?Started: Saturday ?

## 2021-10-30 NOTE — ED Provider Notes (Signed)
?[facility] ? ? ? ?CSN: [id number] ?Arrival date & time: 10/30/21  1836 ?  ? ?HISTORY  ? ?Chief Complaint  ?Patient presents with  ? URI  ?  Shortness of breath, feeling fatigue and dehydrated - Entered by patient  ? Cough  ? ?HPI ?Shannon [name] is a 51 y.o. female. Pt c/o SOB, cough, patient c/o chest discomfort and back discomfort. Pt was prescribed doxycycline that she is still currently taking but is not feeling better, states she has 4 more days left.  Patient reports a history of lupus on hydroxychloroquine and CellCept.  Patient also reports a history of interstitial lung disease.  I see in her chart that she is also being diagnosed with cough variant asthma.  Patient states she does use her Breo daily and takes 1 puff of albuterol when she feels like she needs it.  Patient states she has not had any today. ? ?Patient states that over the weekend, she kept her 2 nieces who apparently had an upper respiratory infection.  Patient states she thinks she caught something from them.  Patient states she feels fatigued, tired, "run over".  Patient states she feels like her chest is caving in, states she is concerned she has pneumonia.  Patient has a mildly elevated temperature on arrival with a heart rate of 109.  Patient's blood pressure is also significantly elevated.  Patient's O2 sat is 94%. ? ?The history is provided by the patient.  ?Past Medical History:  ?Diagnosis Date  ? Anemia   ? Anxiety   ? Depression   ? Dyspnea   ? Sometimes at rest  ? G6PD deficiency   ? GERD (gastroesophageal reflux disease)   ? Hypertension   ? ILD (interstitial lung disease) (HCC)   ? Lupus (HCC)   ? Pneumonia   ? Rheumatoid arthritis (HCC)   ? Sleep apnea   ? ?Patient Active Problem List  ? Diagnosis Date Noted  ? Primary osteoarthritis of right knee 04/30/2020  ? OA (osteoarthritis) of knee 04/04/2019  ? Acute bronchitis 02/13/2017  ? Cough variant asthma  vs uacs  12/17/2016  ? Morbid obesity due to excess calories  (HCC) 12/17/2016  ? Contracture of left elbow 11/21/2016  ? Primary osteoarthritis of both knees 11/21/2016  ? Pain in right hand 09/09/2016  ? Pain in left hand 09/09/2016  ? History of osteoarthritis 09/09/2016  ? Rheumatoid factor positive 08/01/2016  ? High risk medication use 01/16/2015  ? G6PD deficiency 10/31/2014  ? Dyspnea 08/23/2014  ? ILD (interstitial lung disease) (HCC) 08/23/2014  ? Other organ or system involvement in systemic lupus erythematosus (HCC) 08/23/2014  ? ?Past Surgical History:  ?Procedure Laterality Date  ? ABDOMINAL HYSTERECTOMY    ? APPENDECTOMY  1980  ? cardiac catherization  08/20/2017  ? CARDIAC CATHETERIZATION N/A 05/10/2015  ? Procedure: Right Heart Cath;  Surgeon: Laurey Morale, MD;  Location: [facility] INVASIVE CV LAB;  Service: Cardiovascular;  Laterality: N/A;  ? CESAREAN SECTION  '95, '02, '07  ? X 3  ? CHOLECYSTECTOMY  2010  ? LAPAROSCOPIC HYSTERECTOMY  10/2015  ? have ovaries  ? RIGHT HEART CATH N/A 08/20/2017  ? Procedure: RIGHT HEART CATH;  Surgeon: Laurey Morale, MD;  Location: [facility] INVASIVE CV LAB;  Service: Cardiovascular;  Laterality: N/A;  ? SHOULDER ARTHROSCOPY WITH SUBACROMIAL DECOMPRESSION Right 08/28/2021  ? Procedure: SHOULDER ARTHROSCOPY WITH SUBACROMIAL DECOMPRESSION AND DEBRIDEMENT;  Surgeon: Jene Every, MD;  Location: [facility];  Service: Orthopedics;  Laterality: Right;  ? TOTAL KNEE ARTHROPLASTY Left 04/04/2019  ? Procedure: TOTAL KNEE ARTHROPLASTY;  Surgeon: Ollen GrossAluisio, Frank, MD;  Location: [facility];  Service: Orthopedics;  Laterality: Left;  50min  ? TOTAL KNEE ARTHROPLASTY Right 04/30/2020  ? Procedure: TOTAL KNEE ARTHROPLASTY;  Surgeon: Ollen GrossAluisio, Frank, MD;  Location: [facility];  Service: Orthopedics;  Laterality: Right;  50min  ? TUBAL LIGATION    ? ?OB History   ?No obstetric history on file. ?  ? ?Home Medications   ? ?Prior to Admission medications   ?Medication Sig Start Date End Date Taking? Authorizing Provider  ?cetirizine (ZYRTEC ALLERGY) 10 MG tablet Take 1  tablet (10 mg total) by mouth at bedtime. 10/30/21 01/28/22 Yes Theadora RamaMorgan, Aolanis Crispen Scales, PA-C  ?montelukast (SINGULAIR) 10 MG tablet Take 1 tablet (10 mg total) by mouth at bedtime. 10/30/21 01/28/22 Yes Theadora RamaMorgan, Yana Schorr Scales, PA-C  ?promethazine-dextromethorphan (PROMETHAZINE-DM) 6.25-15 MG/5ML syrup Take 5 mLs by mouth 4 (four) times daily as needed for cough. 10/30/21  Yes Theadora RamaMorgan, Hasan Douse Scales, PA-C  ?albuterol (VENTOLIN HFA) 108 (90 Base) MCG/ACT inhaler Inhale 2 puffs into the lungs every 6 (six) hours as needed for wheezing or shortness of breath. ?Patient taking differently: Inhale 1 puff into the lungs every 4 (four) hours as needed for wheezing or shortness of breath. 11/02/18   Kalman Shanamaswamy, Murali, MD  ?buPROPion (WELLBUTRIN XL) 150 MG 24 hr tablet Take 150 mg by mouth daily.  01/27/18   [provider]  ?busPIRone (BUSPAR) 15 MG tablet Take 15 mg by mouth daily.    [provider]  ?carvedilol (COREG) 6.25 MG tablet Take 6.25 mg by mouth 2 (two) times daily. 08/11/21   [provider]  ?famotidine (PEPCID) 20 MG tablet Take 20 mg by mouth at bedtime.    [provider]  ?fluticasone (FLONASE) 50 MCG/ACT nasal spray Place 2 sprays into both nostrils daily. 05/30/21   [provider]  ?fluticasone furoate-vilanterol (BREO ELLIPTA) 200-25 MCG/ACT AEPB Inhale 1 puff by mouth once daily. 05/13/21   Tomma Lightninglalere, Adewale A, MD  ?hydroxychloroquine (PLAQUENIL) 200 MG tablet Take 400 mg by mouth daily.     [provider]  ?lisinopril (ZESTRIL) 10 MG tablet Take 10 mg by mouth daily.    [provider]  ?methocarbamol (ROBAXIN) 500 MG tablet Take 1 tablet (500 mg total) by mouth every 8 (eight) hours as needed for muscle spasms. 08/28/21   Jene EveryBeane, Jeffrey, MD  ?mycophenolate (CELLCEPT) 500 MG tablet Take 2 tablets (1,000 mg total) by mouth 2 (two) times daily. 05/02/20   Edmisten, Lyn HollingsheadKristie L, PA  ?Oxycodone HCl 10 MG TABS Take 10 mg by mouth in the morning, at noon, in  the evening, and at bedtime. 12/12/19   [provider]  ?QUEtiapine (SEROQUEL) 200 MG tablet Take 200 mg by mouth at bedtime.     [provider]  ?sertraline (ZOLOFT) 100 MG tablet Take 200 mg by mouth at bedtime. 02/23/18   [provider]  ? ?Family History ?Family History  ?Problem Relation Age of Onset  ? Sarcoidosis Mother   ? Lupus Sister   ? Healthy Daughter   ? Healthy Son   ? Healthy Son   ? ?Social History ?Social History  ? ?Tobacco Use  ? Smoking status: Former  ?  Packs/day: 0.50  ?  Years: 14.00  ?  Pack years: 7.00  ?  Types: Cigarettes  ?  Quit date: 09/25/2014  ?  Years since quitting: 7.1  ? Smokeless  tobacco: Never  ?Vaping Use  ? Vaping Use: Never used  ?Substance Use Topics  ? Alcohol use: Yes  ?  Alcohol/week: 0.0 standard drinks  ?  Comment: occas.  ? Drug use: No  ? ?Allergies   ?Asa [aspirin], Penicillins, and Sulfa antibiotics ? ?Review of Systems ?Review of Systems ?Pertinent findings noted in history of present illness.  ? ?Physical Exam ?Triage Vital Signs ?ED Triage Vitals  ?Enc Vitals Group  ?   BP 04/19/21 0827 (!) 147/82  ?   Pulse Rate 04/19/21 0827 72  ?   Resp 04/19/21 0827 18  ?   Temp 04/19/21 0827 98.3 ?F (36.8 ?C)  ?   Temp Source 04/19/21 0827 Oral  ?   SpO2 04/19/21 0827 98 %  ?   Weight --   ?   Height --   ?   Head Circumference --   ?   Peak Flow --   ?   Pain Score 04/19/21 0826 5  ?   Pain Loc --   ?   Pain Edu? --   ?   Excl. in GC? --   ?No data found. ? ?Updated Vital Signs ?BP (!) 155/100 (BP Location: Left Arm)   Pulse (!) 109   Temp 99.6 ?F (37.6 ?C) (Oral)   Resp 20   LMP 04/28/2015   SpO2 94%  ? ?Physical Exam ?Vitals and nursing note reviewed.  ?Constitutional:   ?   General: She is not in acute distress. ?   Appearance: Normal appearance. She is not ill-appearing.  ?HENT:  ?   Head: Normocephalic and atraumatic.  ?   Salivary Glands: Right salivary gland is not diffusely enlarged or tender. Left salivary gland is not diffusely  enlarged or tender.  ?   Right Ear: Tympanic membrane, ear canal and external ear normal. No drainage. No middle ear effusion. There is no impacted cerumen. Tympanic membrane is not erythematous or bulging.  ?

## 2021-11-06 IMAGING — CR DG FINGER INDEX 2+V*L*
3 series · 3 of 3 positions shown · non-contrast
Comparison: None.

CLINICAL DATA: Pain soft tissue swelling

EXAM:
LEFT INDEX FINGER 2+V

[x finger pa left]
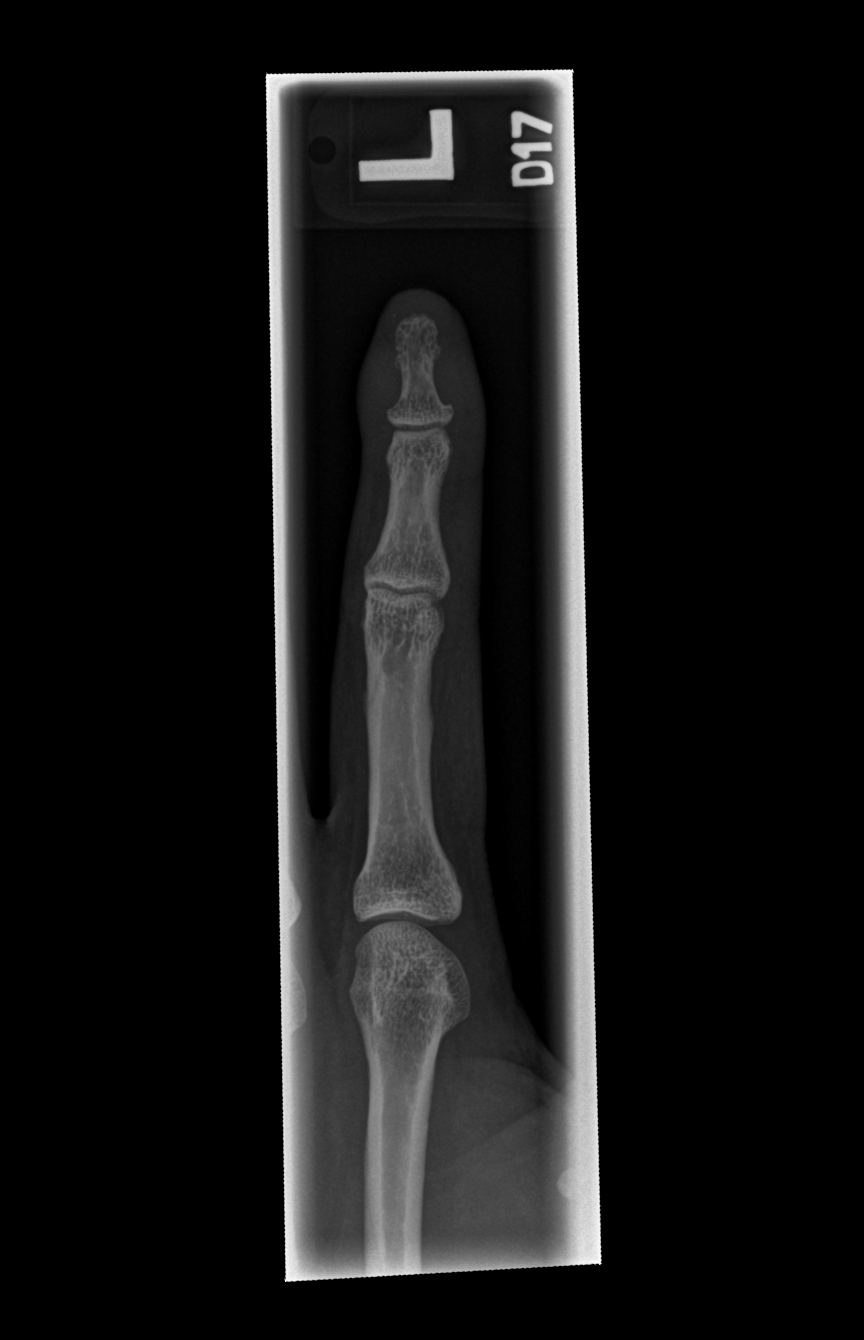

[x finger obl left]
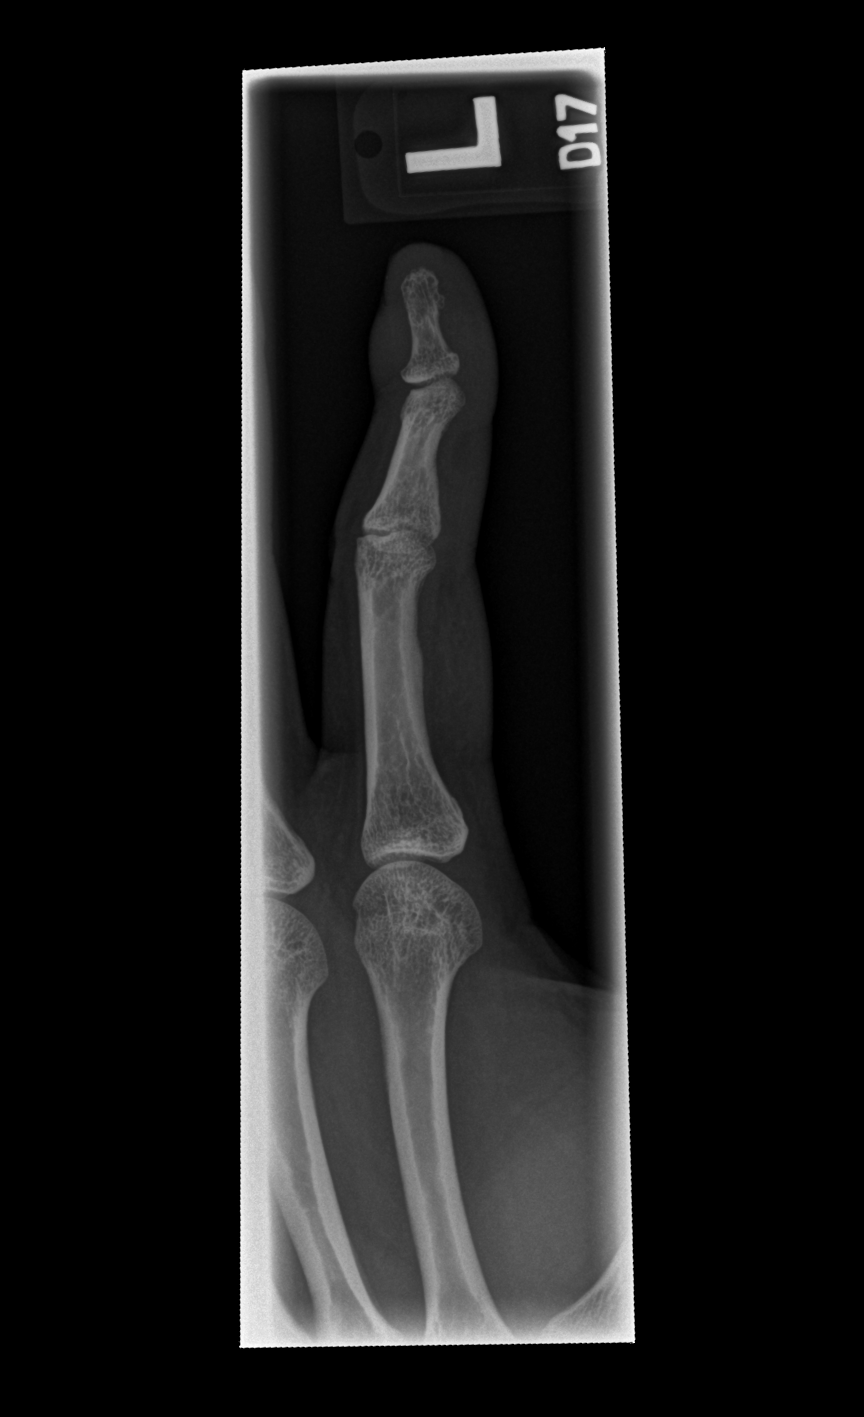

[x finger lat left]
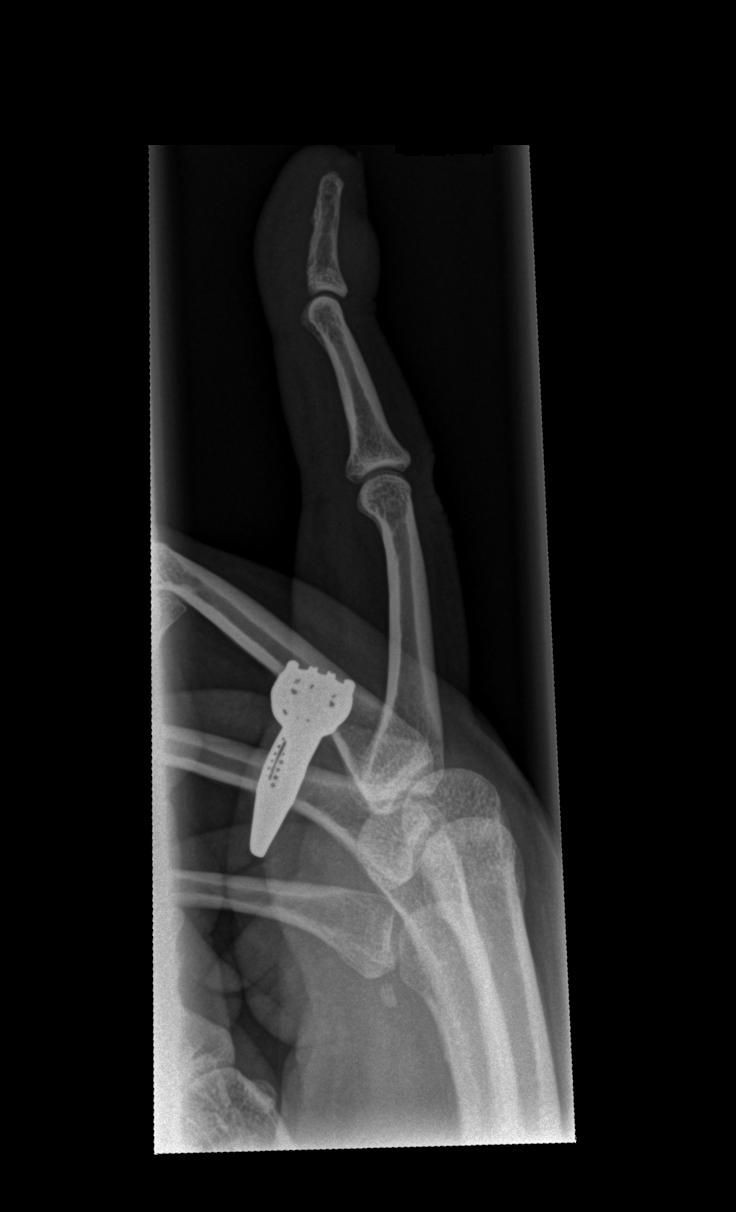

[3 of 3 positions shown; findings below may reference images not displayed]

FINDINGS: There is no evidence of fracture or dislocation. There is no
evidence of arthropathy or other focal bone abnormality. Soft tissue
swelling is seen on the dorsal aspect of the distal phalanx.
IMPRESSION: No acute osseous abnormality. Soft tissue swelling over the dorsal
distal phalanx.

## 2021-11-07 DIAGNOSIS — Z6833 Body mass index (BMI) 33.0-33.9, adult: Secondary | ICD-10-CM | POA: Diagnosis not present

## 2021-11-07 DIAGNOSIS — J4540 Moderate persistent asthma, uncomplicated: Secondary | ICD-10-CM | POA: Diagnosis not present

## 2021-11-11 DIAGNOSIS — M329 Systemic lupus erythematosus, unspecified: Secondary | ICD-10-CM | POA: Diagnosis not present

## 2021-11-12 DIAGNOSIS — J849 Interstitial pulmonary disease, unspecified: Secondary | ICD-10-CM | POA: Diagnosis not present

## 2021-11-12 DIAGNOSIS — G894 Chronic pain syndrome: Secondary | ICD-10-CM | POA: Diagnosis not present

## 2021-11-12 DIAGNOSIS — M1711 Unilateral primary osteoarthritis, right knee: Secondary | ICD-10-CM | POA: Diagnosis not present

## 2021-11-12 DIAGNOSIS — Z79891 Long term (current) use of opiate analgesic: Secondary | ICD-10-CM | POA: Diagnosis not present

## 2021-11-12 DIAGNOSIS — M329 Systemic lupus erythematosus, unspecified: Secondary | ICD-10-CM | POA: Diagnosis not present

## 2021-11-12 DIAGNOSIS — Z96652 Presence of left artificial knee joint: Secondary | ICD-10-CM | POA: Diagnosis not present

## 2021-11-12 DIAGNOSIS — M25561 Pain in right knee: Secondary | ICD-10-CM | POA: Diagnosis not present

## 2021-11-12 DIAGNOSIS — Z96651 Presence of right artificial knee joint: Secondary | ICD-10-CM | POA: Diagnosis not present

## 2021-11-13 DIAGNOSIS — M329 Systemic lupus erythematosus, unspecified: Secondary | ICD-10-CM | POA: Diagnosis not present

## 2021-11-15 DIAGNOSIS — M329 Systemic lupus erythematosus, unspecified: Secondary | ICD-10-CM | POA: Diagnosis not present

## 2021-11-18 DIAGNOSIS — M329 Systemic lupus erythematosus, unspecified: Secondary | ICD-10-CM | POA: Diagnosis not present

## 2021-11-19 DIAGNOSIS — M545 Low back pain, unspecified: Secondary | ICD-10-CM | POA: Diagnosis not present

## 2021-11-19 DIAGNOSIS — M5459 Other low back pain: Secondary | ICD-10-CM | POA: Diagnosis not present

## 2021-11-20 DIAGNOSIS — M329 Systemic lupus erythematosus, unspecified: Secondary | ICD-10-CM | POA: Diagnosis not present

## 2021-11-22 DIAGNOSIS — M47816 Spondylosis without myelopathy or radiculopathy, lumbar region: Secondary | ICD-10-CM | POA: Diagnosis not present

## 2021-11-22 DIAGNOSIS — M1711 Unilateral primary osteoarthritis, right knee: Secondary | ICD-10-CM | POA: Diagnosis not present

## 2021-11-22 DIAGNOSIS — M25561 Pain in right knee: Secondary | ICD-10-CM | POA: Diagnosis not present

## 2021-11-22 DIAGNOSIS — M329 Systemic lupus erythematosus, unspecified: Secondary | ICD-10-CM | POA: Diagnosis not present

## 2021-11-22 DIAGNOSIS — E669 Obesity, unspecified: Secondary | ICD-10-CM | POA: Diagnosis not present

## 2021-11-22 DIAGNOSIS — Z96652 Presence of left artificial knee joint: Secondary | ICD-10-CM | POA: Diagnosis not present

## 2021-11-22 DIAGNOSIS — Z96651 Presence of right artificial knee joint: Secondary | ICD-10-CM | POA: Diagnosis not present

## 2021-11-22 DIAGNOSIS — Z1389 Encounter for screening for other disorder: Secondary | ICD-10-CM | POA: Diagnosis not present

## 2021-11-22 DIAGNOSIS — Z79891 Long term (current) use of opiate analgesic: Secondary | ICD-10-CM | POA: Diagnosis not present

## 2021-11-26 DIAGNOSIS — J849 Interstitial pulmonary disease, unspecified: Secondary | ICD-10-CM | POA: Diagnosis not present

## 2021-11-26 DIAGNOSIS — Z96652 Presence of left artificial knee joint: Secondary | ICD-10-CM | POA: Diagnosis not present

## 2021-11-26 DIAGNOSIS — M25561 Pain in right knee: Secondary | ICD-10-CM | POA: Diagnosis not present

## 2021-11-26 DIAGNOSIS — M1711 Unilateral primary osteoarthritis, right knee: Secondary | ICD-10-CM | POA: Diagnosis not present

## 2021-11-26 DIAGNOSIS — Z79891 Long term (current) use of opiate analgesic: Secondary | ICD-10-CM | POA: Diagnosis not present

## 2021-11-26 DIAGNOSIS — G894 Chronic pain syndrome: Secondary | ICD-10-CM | POA: Diagnosis not present

## 2021-11-26 DIAGNOSIS — M329 Systemic lupus erythematosus, unspecified: Secondary | ICD-10-CM | POA: Diagnosis not present

## 2021-11-26 DIAGNOSIS — Z96651 Presence of right artificial knee joint: Secondary | ICD-10-CM | POA: Diagnosis not present

## 2021-12-02 DIAGNOSIS — M329 Systemic lupus erythematosus, unspecified: Secondary | ICD-10-CM | POA: Diagnosis not present

## 2021-12-03 ENCOUNTER — Other Ambulatory Visit: Payer: Self-pay

## 2021-12-03 ENCOUNTER — Ambulatory Visit (INDEPENDENT_AMBULATORY_CARE_PROVIDER_SITE_OTHER): Payer: Medicare HMO

## 2021-12-03 ENCOUNTER — Ambulatory Visit (INDEPENDENT_AMBULATORY_CARE_PROVIDER_SITE_OTHER): Payer: Medicare HMO | Admitting: Acute Care

## 2021-12-03 ENCOUNTER — Encounter: Payer: Self-pay | Admitting: Acute Care

## 2021-12-03 VITALS — BP 124/82 | HR 93 | Temp 98.3°F | Ht 71.0 in | Wt 302.0 lb

## 2021-12-03 DIAGNOSIS — G4733 Obstructive sleep apnea (adult) (pediatric): Secondary | ICD-10-CM

## 2021-12-03 DIAGNOSIS — J440 Chronic obstructive pulmonary disease with acute lower respiratory infection: Secondary | ICD-10-CM | POA: Diagnosis not present

## 2021-12-03 DIAGNOSIS — R059 Cough, unspecified: Secondary | ICD-10-CM | POA: Diagnosis not present

## 2021-12-03 DIAGNOSIS — J209 Acute bronchitis, unspecified: Secondary | ICD-10-CM

## 2021-12-03 DIAGNOSIS — R051 Acute cough: Secondary | ICD-10-CM | POA: Diagnosis not present

## 2021-12-03 DIAGNOSIS — M5416 Radiculopathy, lumbar region: Secondary | ICD-10-CM | POA: Diagnosis not present

## 2021-12-03 DIAGNOSIS — J849 Interstitial pulmonary disease, unspecified: Secondary | ICD-10-CM | POA: Diagnosis not present

## 2021-12-03 DIAGNOSIS — I509 Heart failure, unspecified: Secondary | ICD-10-CM | POA: Diagnosis not present

## 2021-12-03 MED ORDER — HYDROCODONE BIT-HOMATROP MBR 5-1.5 MG/5ML PO SOLN: 5.0000 mL | Freq: Four times a day (QID) | ORAL | 0 refills | Status: DC | PRN

## 2021-12-03 MED ORDER — METHYLPREDNISOLONE ACETATE 80 MG/ML IJ SUSP
80.0000 mg | Freq: Once | INTRAMUSCULAR | Status: AC
  Administered 2021-12-03: 80 mg via INTRAMUSCULAR

## 2021-12-03 MED ORDER — DOXYCYCLINE HYCLATE 100 MG PO TABS: 100.0000 mg | ORAL_TABLET | Freq: Two times a day (BID) | ORAL | 0 refills | Status: DC

## 2021-12-03 NOTE — Patient Instructions (Addendum)
It is good to see you today.  We will do a CXR today. We will give you a Depo Medrol injection * 80 mg) We will repeat a prednisone taper Prednisone taper; 10 mg tablets: 4 tabs x 2 days, 3 tabs x 2 days, 2 tabs x 2 days 1 tab x 2 days then stop.  Pro BNP today in the lab Doxycycline 100 mg twice daily x 7 days Wear sunblock if outside Take until gone Hycodan Cough syrup 5 mls as needed every 6 hours for cough Do not drive if sleepy.  Continue Breo and Singulair Rinse after inhaler use Continue Cell Cept  and  hydroxychloroquine  Follow up in 2 weeks with Judson Roch NP  Please contact office for sooner follow up if symptoms do not improve or worsen or seek emergency care

## 2021-12-03 NOTE — Progress Notes (Signed)
History of Present Illness Shannon [name] is a 51 y.o. female former smoker , quit 2016 ( 7 pack year smoking history) with COPD, Auto immune ILD , Lupus, OSA and cough variant asthma. She is followed by Dr. Marchelle Gearing for her ILD, and Dr. Wynona Neat for her OSA.   Last OV for ILD 02/2021 Last OV for OSA  Maintenance : Cell Cept  and  hydroxychloroquine tor ILD/ Lupus Breo 200 , Singulair for Asthma    12/03/2021 Pt. Presents for follow up. She was seen 10/30/2021 for cough and chest discomfort at urgent care.  She was treated with  Medrol Dosepak and a Decadron injection in the office today.  She was on Doxycycline at the time per her PCP. She was told to complete the Doxy and add Levofloxacin ( Together). She did get better after this treatment. Secretions were never white, but got less yellow. Then this Friday, she had resumption of her symptoms. First sneezing and then her throat was sore, and she developed a cough. She denies any fever. She denies any post nasal drip. The cough is productive for thick yellow secretions. She states she has been wheezing at intervals. . She is compliant with her Breo and Singulair. She uses her rescue several times a week. She states she has had some swelling of her feet  and ankles also. She has not had cardiology work up recently. She did have cath and echo early 2019. She states she is compliant with her CPAP use. .   Test Results: PFT 07/2020 Pre-BD Post-BD ---- SPIROMETRY ---- FVC (L) 2.59 2.76 -1.98 72 FEV1 (L) 2.28 2.19 -1.43 78 FEV1/FVC (%) 88 71 1 107 Expiratory Time (sec) 5.21 TestGrade(ATS) AA ---- Slow Vital Capacity --- SVC (L) 2.69 2.76 -1.78 74 IC (L) 2.04 79 ERV (L) 0.65 62 FEV1/SVC (%) 85 105 ---- DIFFUSION ---- DLCOunc (ml/min/mmHg) 19.54 19.66 -1.68 76 DLCOcor (ml/min/mmHg) 19.54 19.66 -1.68 76 Hgb (gm/dL) 09.3 VA (L) 2.35 5.73 -3.36 65 TLC (SB) (L) 4.13 Kco (ml/min/mmHg/L) 4.91 3.30 1.06 115  Although the FEV1 and FVC are  reduced, the FEV1/FVC ratio is increased. The slow vital capacity is reduced. The reduced diffusing capacity indicates a minimal loss of functional alveolar capillary surface. Conclusions: The diffusion defect, increased FEV1/FVC ratio and reduced FVC suggest an early interstitial process. Pulmonary Function Diagnosis: Minimal Restriction -Interstitial Minimal Diffusion Defect     RHC 07/2017 Right Heart Pressures RHC Procedural Findings: Hemodynamics (mmHg) RA mean 3 RV 30/6 PA 23/8, mean 14 PCWP mean 8  Oxygen saturations: PA 72% AO 98%  Cardiac Output (Fick) 6.73  Cardiac Index (Fick) 3.06  Cardiac Output (Thermo) 6.94 Cardiac Index (Thermo) 3.15       Latest Ref Rng & Units 08/28/2021    8:50 AM 05/02/2020    6:45 AM 05/01/2020    3:12 AM  CBC  WBC 4.0 - 10.5 K/uL 5.0  9.3  8.8   Hemoglobin 12.0 - 15.0 g/dL 22.0  25.4  9.9   Hematocrit 36.0 - 46.0 % 33.4  31.9  31.9   Platelets 150 - 400 K/uL 209  225  230        Latest Ref Rng & Units 08/15/2021   10:31 AM 05/02/2020    6:45 AM 05/01/2020    3:12 AM  BMP  Glucose 70 - 99 mg/dL 270  623  762   BUN 6 - 20 mg/dL Creatinine 0.44 - 1.00 mg/dL  0.95  0.74  0.83   Sodium 135 - 145 mmol/L 139  134  136   Potassium 3.5 - 5.1 mmol/L 3.2  4.0  3.4   Chloride 98 - 111 mmol/L 105  99  100   CO2 22 - 32 mmol/L Calcium 8.9 - 10.3 mg/dL 8.9  8.8  9.1     BNP No results found for: "BNP"  ProBNP No results found for: "PROBNP"  PFT    Component Value Date/Time   FEV1PRE 2.24 03/05/2021 1458   FEV1POST 2.34 10/12/2015 1342   FVCPRE 2.64 03/05/2021 1458   FVCPOST 2.65 10/12/2015 1342   TLC 3.84 10/12/2015 1342   DLCOUNC 15.34 03/05/2021 1458   PREFEV1FVCRT 85 03/05/2021 1458   PSTFEV1FVCRT 88 10/12/2015 1342    DG Chest 2 View  Result Date: 12/03/2021 CLINICAL DATA:  51 year old female with cough. Chronic lung disease. EXAM: CHEST - 2 VIEW COMPARISON:  High-resolution chest CT  08/27/2021 and earlier. FINDINGS: Borderline to mild cardiomegaly appears chronic and not significantly changed since 2012. Other mediastinal contours are within normal limits. Stable lung volumes. No pneumothorax, pulmonary edema, pleural effusion or confluent pulmonary opacity. No acute osseous abnormality identified. Negative visible bowel gas. IMPRESSION: No acute cardiopulmonary abnormality. Electronically Signed   By: Odessa Fleming M.D.   On: 12/03/2021 10:28     Past medical hx Past Medical History:  Diagnosis Date   Anemia    Anxiety    Depression    Dyspnea    Sometimes at rest   G6PD deficiency    GERD (gastroesophageal reflux disease)    Hypertension    ILD (interstitial lung disease) (HCC)    Lupus (HCC)    Pneumonia    Rheumatoid arthritis (HCC)    Sleep apnea      Social History   Tobacco Use   Smoking status: Former    Packs/day: 0.50    Years: 14.00    Total pack years: 7.00    Types: Cigarettes    Quit date: 09/25/2014    Years since quitting: 7.1   Smokeless tobacco: Never  Vaping Use   Vaping Use: Never used  Substance Use Topics   Alcohol use: Yes    Alcohol/week: 0.0 standard drinks of alcohol    Comment: occas.   Drug use: No    Ms.Happ reports that she quit smoking about 7 years ago. Her smoking use included cigarettes. She has a 7.00 pack-year smoking history. She has never used smokeless tobacco. She reports current alcohol use. She reports that she does not use drugs.  Tobacco Cessation: Former smoker with a 7 pack year smoking history   Past surgical hx, Family hx, Social hx all reviewed.  Current Outpatient Medications on File Prior to Visit  Medication Sig   albuterol (VENTOLIN HFA) 108 (90 Base) MCG/ACT inhaler Inhale 2 puffs into the lungs every 6 (six) hours as needed for wheezing or shortness of breath. (Patient taking differently: Inhale 1 puff into the lungs every 4 (four) hours as needed for wheezing or shortness of breath.)    buPROPion (WELLBUTRIN XL) 150 MG 24 hr tablet Take 150 mg by mouth daily.    busPIRone (BUSPAR) 15 MG tablet Take 15 mg by mouth daily.   carvedilol (COREG) 6.25 MG tablet Take 6.25 mg by mouth 2 (two) times daily.   cetirizine (ZYRTEC ALLERGY) 10 MG tablet Take 1 tablet (10 mg total) by mouth at bedtime.   famotidine (  PEPCID) 20 MG tablet Take 20 mg by mouth at bedtime.   fluticasone (FLONASE) 50 MCG/ACT nasal spray Place 2 sprays into both nostrils daily.   fluticasone furoate-vilanterol (BREO ELLIPTA) 200-25 MCG/ACT AEPB Inhale 1 puff by mouth once daily.   hydroxychloroquine (PLAQUENIL) 200 MG tablet Take 400 mg by mouth daily.    lisinopril (ZESTRIL) 10 MG tablet Take 10 mg by mouth daily.   methocarbamol (ROBAXIN) 500 MG tablet Take 1 tablet (500 mg total) by mouth every 8 (eight) hours as needed for muscle spasms.   montelukast (SINGULAIR) 10 MG tablet Take 1 tablet (10 mg total) by mouth at bedtime.   mycophenolate (CELLCEPT) 500 MG tablet Take 2 tablets (1,000 mg total) by mouth 2 (two) times daily.   Oxycodone HCl 10 MG TABS Take 10 mg by mouth in the morning, at noon, in the evening, and at bedtime.   QUEtiapine (SEROQUEL) 200 MG tablet Take 200 mg by mouth at bedtime.    sertraline (ZOLOFT) 100 MG tablet Take 200 mg by mouth at bedtime.   No current facility-administered medications on file prior to visit.     Allergies  Allergen Reactions   Asa [Aspirin] Hives and Shortness Of Breath    Chest tightness    Penicillins Hives    Has patient had a PCN reaction causing immediate rash, facial/tongue/throat swelling, SOB or lightheadedness with hypotension: Unknown Has patient had a PCN reaction causing severe rash involving mucus membranes or skin necrosis: Unknown Has patient had a PCN reaction that required hospitalization: Unknown Has patient had a PCN reaction occurring within the last 10 years: No If all of the above answers are "NO", then may proceed with Cephalosporin  use.  Tolerated Cephalosporin Date: 05/01/20.     Sulfa Antibiotics     G6PD deficiency     Review Of Systems:  Constitutional:   No  weight loss, night sweats,  Fevers, chills, fatigue, or  lassitude.  HEENT:   No headaches,  Difficulty swallowing,  Tooth/dental problems, or  ++Sore throat, ( Resolved)               No sneezing, itching, ear ache, nasal congestion, post nasal drip,   CV:  No chest pain,  Orthopnea, PND, + swelling in lower extremities,no  anasarca, dizziness, palpitations, syncope.   GI  No heartburn, indigestion, abdominal pain, nausea, vomiting, diarrhea, change in bowel habits, loss of appetite, bloody stools.   Resp: No shortness of breath with exertion or at rest.  + excess mucus, + productive cough,  No non-productive cough,  No coughing up of blood.  No change in color of mucus.  + wheezing.  No chest wall deformity  Skin: no rash or lesions.  GU: no dysuria, change in color of urine, no urgency or frequency.  No flank pain, no hematuria   MS:  No joint pain or swelling.  No decreased range of motion.  No back pain.  Psych:  No change in mood or affect. No depression or anxiety.  No memory loss.   Vital Signs BP 124/82 (BP Location: Right Arm, Cuff Size: Large)   Pulse 93   Temp 98.3 F (36.8 C) (Oral)   Ht 5\' 11"  (1.803 m)   Wt (!) 302 lb (137 kg)   LMP 04/28/2015   SpO2 100% Comment: on RA  BMI 42.12 kg/m    Physical Exam:  General- No distress,  A&Ox3, pleasant ENT: No sinus tenderness, TM clear, pale nasal mucosa, no oral  exudate,no post nasal drip, no LAN Cardiac: S1, S2, regular rate and rhythm, no murmur Chest: No wheeze/ rales/ dullness; no accessory muscle use, no nasal flaring, no sternal retractions, crackles per bases about half way up  Abd.: Soft Non-tender, ND, BS +, Body mass index is 42.12 kg/m. Ext: No clubbing cyanosis, edema, no obvious deformities Neuro:  normal strength, MAE x 4, A&O x 3 Skin: No rashes, warm and dry,  no lesions, +tattoos Psych: normal mood and behavior   Assessment/Plan Slow to resolve asthma exacerbation Treated x 1 with prednisone and antibiotic Plan We will do a CXR today. We will give you a Depo Medrol injection * 80 mg) We will repeat a prednisone taper Prednisone taper; 10 mg tablets: 4 tabs x 2 days, 3 tabs x 2 days, 2 tabs x 2 days 1 tab x 2 days then stop.  Pro BNP today in the lab Doxycycline 100 mg twice daily x 7 days Wear sunblock if outside Take until gone Hycodan Cough syrup 5 mls as needed every 6 hours for cough Do not drive if sleepy.  Continue Breo and Singulair Rinse after inhaler use Continue Cell Cept  and  hydroxychloroquine  Follow up in 2 weeks with Maralyn Sago NP  Please contact office for sooner follow up if symptoms do not improve or worsen or seek emergency care    When better at follow up  patient needs 6 minute walk/ PFT/ ? HRCT Chest and echo then follow up with Dr. Marchelle Gearing  I spent 40 minutes dedicated to the care of this patient on the date of this encounter to include pre-visit review of records, face-to-face time with the patient discussing conditions above, post visit ordering of testing, clinical documentation with the electronic health record, making appropriate referrals as documented, and communicating necessary information to the patient's healthcare team.      Bevelyn Ngo, NP 12/03/2021  2:00 PM

## 2021-12-04 DIAGNOSIS — M329 Systemic lupus erythematosus, unspecified: Secondary | ICD-10-CM | POA: Diagnosis not present

## 2021-12-04 LAB — PRO B NATRIURETIC PEPTIDE: NT-Pro BNP: 53 pg/mL (ref 0–249)

## 2021-12-05 MED ORDER — PREDNISONE 10 MG PO TABS: ORAL_TABLET | ORAL | 0 refills | Status: DC

## 2021-12-05 NOTE — Progress Notes (Signed)
Please let patient know her Pro BNP, which can help measure heart strain and volume overload was within normal limits. This is good news.  Have her follow up as scheduled. Thanks so much

## 2021-12-06 DIAGNOSIS — M329 Systemic lupus erythematosus, unspecified: Secondary | ICD-10-CM | POA: Diagnosis not present

## 2021-12-09 ENCOUNTER — Other Ambulatory Visit: Payer: Self-pay | Admitting: Acute Care

## 2021-12-09 ENCOUNTER — Telehealth: Payer: Self-pay | Admitting: Acute Care

## 2021-12-09 DIAGNOSIS — R053 Chronic cough: Secondary | ICD-10-CM

## 2021-12-09 DIAGNOSIS — M5136 Other intervertebral disc degeneration, lumbar region: Secondary | ICD-10-CM | POA: Diagnosis not present

## 2021-12-09 DIAGNOSIS — M329 Systemic lupus erythematosus, unspecified: Secondary | ICD-10-CM | POA: Diagnosis not present

## 2021-12-09 MED ORDER — PROMETHAZINE-DM 6.25-15 MG/5ML PO SYRP: 5.0000 mL | ORAL_SOLUTION | Freq: Two times a day (BID) | ORAL | 0 refills | Status: DC | PRN

## 2021-12-09 NOTE — Telephone Encounter (Signed)
I had a request to fill a prescription for this patient for a different cough syrup, she said her insurance will not pay for the hycodan. I have sent in the alternate medication she requested. This additional request for Hycodan will not be filled as I have already sent in a cough syrup.     Shannon [name],    Patient is requesting refills on the medication noted below    Please advise     Thank you   ----- Message -----  From: Judye Bos  Sent: 12/08/2021   9:48 PM EDT  To: [facility]  Subject: Medication Renewal Request                       Refills have been requested for the following medications:        HYDROcodone bit-homatropine (HYCODAN) 5-1.5 MG/5ML syrup [name]]    Preferred pharmacy: [facility] - [place], [place] - [address]  Delivery method: [employer]

## 2021-12-10 ENCOUNTER — Other Ambulatory Visit: Payer: Self-pay | Admitting: Specialist

## 2021-12-10 DIAGNOSIS — M5416 Radiculopathy, lumbar region: Secondary | ICD-10-CM

## 2021-12-11 DIAGNOSIS — M329 Systemic lupus erythematosus, unspecified: Secondary | ICD-10-CM | POA: Diagnosis not present

## 2021-12-12 ENCOUNTER — Ambulatory Visit: Payer: Medicare HMO | Admitting: Pulmonary Disease

## 2021-12-13 ENCOUNTER — Ambulatory Visit (INDEPENDENT_AMBULATORY_CARE_PROVIDER_SITE_OTHER): Payer: Medicare HMO | Admitting: Nurse Practitioner

## 2021-12-13 ENCOUNTER — Encounter: Payer: Self-pay | Admitting: Nurse Practitioner

## 2021-12-13 VITALS — BP 140/96 | HR 112 | Temp 99.3°F | Ht 71.0 in | Wt 303.8 lb

## 2021-12-13 DIAGNOSIS — Z79891 Long term (current) use of opiate analgesic: Secondary | ICD-10-CM | POA: Diagnosis not present

## 2021-12-13 DIAGNOSIS — R058 Other specified cough: Secondary | ICD-10-CM | POA: Diagnosis not present

## 2021-12-13 DIAGNOSIS — M329 Systemic lupus erythematosus, unspecified: Secondary | ICD-10-CM | POA: Diagnosis not present

## 2021-12-13 DIAGNOSIS — M1711 Unilateral primary osteoarthritis, right knee: Secondary | ICD-10-CM | POA: Diagnosis not present

## 2021-12-13 DIAGNOSIS — M25561 Pain in right knee: Secondary | ICD-10-CM | POA: Diagnosis not present

## 2021-12-13 DIAGNOSIS — J45991 Cough variant asthma: Secondary | ICD-10-CM | POA: Diagnosis not present

## 2021-12-13 DIAGNOSIS — J849 Interstitial pulmonary disease, unspecified: Secondary | ICD-10-CM

## 2021-12-13 DIAGNOSIS — R051 Acute cough: Secondary | ICD-10-CM

## 2021-12-13 DIAGNOSIS — Z96651 Presence of right artificial knee joint: Secondary | ICD-10-CM | POA: Diagnosis not present

## 2021-12-13 DIAGNOSIS — G894 Chronic pain syndrome: Secondary | ICD-10-CM | POA: Diagnosis not present

## 2021-12-13 LAB — BASIC METABOLIC PANEL
BUN: 17 mg/dL (ref 6–23)
CO2: 27 mEq/L (ref 19–32)
Calcium: 9.5 mg/dL (ref 8.4–10.5)
Chloride: 102 mEq/L (ref 96–112)
Creatinine, Ser: 0.78 mg/dL (ref 0.40–1.20)
GFR: 89.43 mL/min (ref >60.00)
Glucose, Bld: 96 mg/dL (ref 70–99)
Potassium: 3.6 mEq/L (ref 3.5–5.1)
Sodium: 137 mEq/L (ref 135–145)

## 2021-12-13 LAB — CBC WITH DIFFERENTIAL/PLATELET
Basophils Absolute: 0.1 10*3/uL (ref 0.0–0.1)
Basophils Relative: 1 % (ref 0.0–3.0)
Eosinophils Absolute: 0.1 10*3/uL (ref 0.0–0.7)
Eosinophils Relative: 1 % (ref 0.0–5.0)
HCT: 36.4 % (ref 36.0–46.0)
Hemoglobin: 12 g/dL (ref 12.0–15.0)
Lymphocytes Relative: 29.4 % (ref 12.0–46.0)
Lymphs Abs: 2.3 10*3/uL (ref 0.7–4.0)
MCHC: 33 g/dL (ref 30.0–36.0)
MCV: 91.6 fl (ref 78.0–100.0)
Monocytes Absolute: 0.4 10*3/uL (ref 0.1–1.0)
Monocytes Relative: 5.7 % (ref 3.0–12.0)
Neutro Abs: 4.8 10*3/uL (ref 1.4–7.7)
Neutrophils Relative %: 62.9 % (ref 43.0–77.0)
Platelets: 299 10*3/uL (ref 150.0–400.0)
RBC: 3.97 Mil/uL (ref 3.87–5.11)
RDW: 14.8 % (ref 11.5–15.5)
WBC: 7.7 10*3/uL (ref 4.0–10.5)

## 2021-12-13 LAB — D-DIMER, QUANTITATIVE: D-Dimer, Quant: 0.33 mcg/mL FEU (ref <0.50)

## 2021-12-13 MED ORDER — BENZONATATE 200 MG PO CAPS: 200.0000 mg | ORAL_CAPSULE | Freq: Three times a day (TID) | ORAL | 1 refills | Status: DC | PRN

## 2021-12-13 MED ORDER — BREZTRI AEROSPHERE 160-9-4.8 MCG/ACT IN AERO: 2.0000 | INHALATION_SPRAY | Freq: Two times a day (BID) | RESPIRATORY_TRACT | 0 refills | Status: DC

## 2021-12-13 NOTE — Assessment & Plan Note (Signed)
Underlying autoimmune ILD.  HRCT from March did not show any progression of disease; she had mild peripheral and basilar predominant reticular densities and groundglass which is favored to be NSIP.  Her cough has been overall nonresponsive to steroids.  Do not think repeating would be beneficial at this point.  Her rheumatologist would prefer to keep her off of prednisone if at all possible as well.  See above plan.

## 2021-12-13 NOTE — Assessment & Plan Note (Addendum)
She said minimal response to multiple rounds of prednisone and Depo injections.  She does have some increased shortness of breath and notices some wheezing.  Lungs were clear to upon exam today without bronchospasm.  Advised that we hold off on any further prednisone at this point.  I did want to try stepping her up to triple therapy to see if she receives any benefit from this and also switch her off DPI inhaler.  Advised that she continue Singulair for trigger prevention.

## 2021-12-13 NOTE — Progress Notes (Signed)
@Patient  ID: Shannon [name], female    DOB: 02/18/1971, 51 y.o.   MRN: [id number]  Chief Complaint  Patient presents with   Follow-up    Patient says her cough has been keeping her up at night. Steroids and antibiotics haven't helped.     Referring provider: Eunice Blase, PA-C  HPI: 51 year old female, former smoker followed for autoimmune ILD, cough variant asthma and OSA on CPAP.  She is followed by Dr. Marchelle Gearing for her ILD and Dr. Wynona Neat for sleep.  She was last seen in office on 12/03/2021 by Groce,NP.  Past medical history significant for rheumatoid arthritis, lupus, seasonal allergies, obesity, depression.  She is on CellCept and hydroxychloroquine for lupus/RA.  TEST/EVENTS:  03/05/2021 PFTs: FVC 73, FEV1 77, ratio 85, DLCO 60 08/27/2021 HRCT chest: Heart is enlarged.  No obvious LAD; there were some small to borderline enlarged subpectoral and axillary lymph nodes which appeared similar and unchanged.  Tiny hiatal hernia was present.  There is mild peripheral and basilar predominant subpleural reticular densities and groundglass.  Assessment for honeycombing limited by respiratory motion and body habitus.  Findings similar and do not appear progressive when compared to 2021.  There was some air trapping noted as well.  NSIP favored. 12/03/2021 CXR 2 view: Borderline to mild cardiomegaly chronic and unchanged.  No acute process noted.  12/03/2021: OV with Groce,NP for follow-up.  She was seen 10/30/2021 for cough and chest discomfort at urgent care.  She was treated with Medrol Dosepak and Decadron injection  She was instructed to complete doxycycline and add Levaquin.  She did get better after this treatment with improvement in cough and secretions cleared up.  She then had resumption of her symptoms, initially with sneezing and then developed a sore throat.  After that she started coughing more again, which was productive with thick yellow secretions.  She is also noticed some increased  wheezing.  CXR did not show any evidence of pneumonia.  She was treated with Depo injection and prednisone taper.  Started on doxycycline course.  Provided with cough control measures.  Continued Breo and Singulair.  She did notice some swelling in her feet and ankles so BMP was ordered which was negative.  Instructed that when she improves she will need 6-minute walk/PFT and HRCT then follow-up with Dr. Marchelle Gearing.  12/13/2021: Today-follow-up Patient presents today for intended follow-up.  She reports that she has not had any change in her symptoms since we saw her last. Doesn't feel like the prednisone nor the antibiotics have made a difference. She has also had two steroid shots up to this point. Still having a hacking cough, which is worse when she lays down. It is occasionally productive with green sputum; slight improvement in amount with abx. She does also note that she has postnasal drainage that bothers her, worse when she lays down, and a sore throat from coughing so much. She denies fevers, worsening leg swelling, night sweats, hemoptysis. Shortness of breath is unchanged, worse with coughing spells. She also feels like she wheezes some with coughing spells. She continues on Breo and occasionally uses her rescue. She takes singulair, zyrtec, and flonase for her allergies. She has been using phenergan DM cough syrup with minimal relief.   Allergies  Allergen Reactions   Asa [Aspirin] Hives and Shortness Of Breath    Chest tightness    Penicillins Hives    Has patient had a PCN reaction causing immediate rash, facial/tongue/throat swelling, SOB or lightheadedness with hypotension:  Unknown Has patient had a PCN reaction causing severe rash involving mucus membranes or skin necrosis: Unknown Has patient had a PCN reaction that required hospitalization: Unknown Has patient had a PCN reaction occurring within the last 10 years: No If all of the above answers are "NO", then may proceed with  Cephalosporin use.  Tolerated Cephalosporin Date: 05/01/20.     Sulfa Antibiotics     G6PD deficiency     Immunization History  Administered Date(s) Administered   Influenza Split 03/23/2014   Influenza,inj,Quad PF,6+ Mos 03/15/2016, 06/08/2017, 03/08/2018, 02/17/2019, 03/05/2021   Influenza-Unspecified 04/03/2020   Moderna Sars-Covid-2 Vaccination 09/20/2019, 10/06/2019   PFIZER Comirnaty(Gray Top)Covid-19 Tri-Sucrose Vaccine 09/13/2019, 10/06/2019, 11/05/2019, 05/30/2020   PFIZER(Purple Top)SARS-COV-2 Vaccination 10/06/2019, 11/05/2019   Pfizer Covid-19 Vaccine Bivalent Booster 52yrs & up 04/03/2021   Pneumococcal-Unspecified 08/23/2014    Past Medical History:  Diagnosis Date   Anemia    Anxiety    Depression    Dyspnea    Sometimes at rest   G6PD deficiency    GERD (gastroesophageal reflux disease)    Hypertension    ILD (interstitial lung disease) (HCC)    Lupus (HCC)    Pneumonia    Rheumatoid arthritis (HCC)    Sleep apnea     Tobacco History: Social History   Tobacco Use  Smoking Status Former   Packs/day: 0.50   Years: 14.00   Total pack years: 7.00   Types: Cigarettes   Quit date: 09/25/2014   Years since quitting: 7.2  Smokeless Tobacco Never   Counseling given: Not Answered   Outpatient Medications Prior to Visit  Medication Sig Dispense Refill   albuterol (VENTOLIN HFA) 108 (90 Base) MCG/ACT inhaler Inhale 2 puffs into the lungs every 6 (six) hours as needed for wheezing or shortness of breath. (Patient taking differently: Inhale 1 puff into the lungs every 4 (four) hours as needed for wheezing or shortness of breath.) 1 Inhaler 5   buPROPion (WELLBUTRIN XL) 150 MG 24 hr tablet Take 150 mg by mouth daily.   1   busPIRone (BUSPAR) 15 MG tablet Take 15 mg by mouth daily.     carvedilol (COREG) 6.25 MG tablet Take 6.25 mg by mouth 2 (two) times daily.     cetirizine (ZYRTEC ALLERGY) 10 MG tablet Take 1 tablet (10 mg total) by mouth at bedtime. 30  tablet 2   fluticasone (FLONASE) 50 MCG/ACT nasal spray Place 2 sprays into both nostrils daily.     fluticasone furoate-vilanterol (BREO ELLIPTA) 200-25 MCG/ACT AEPB Inhale 1 puff by mouth once daily. 60 each 5   HYDROcodone bit-homatropine (HYCODAN) 5-1.5 MG/5ML syrup Take 5 mLs by mouth every 6 (six) hours as needed for cough. 120 mL 0   hydroxychloroquine (PLAQUENIL) 200 MG tablet Take 400 mg by mouth daily.      lisinopril (ZESTRIL) 10 MG tablet Take 10 mg by mouth daily.     methocarbamol (ROBAXIN) 500 MG tablet Take 1 tablet (500 mg total) by mouth every 8 (eight) hours as needed for muscle spasms. 40 tablet 1   montelukast (SINGULAIR) 10 MG tablet Take 1 tablet (10 mg total) by mouth at bedtime. 30 tablet 2   mycophenolate (CELLCEPT) 500 MG tablet Take 2 tablets (1,000 mg total) by mouth 2 (two) times daily.     Oxycodone HCl 10 MG TABS Take 10 mg by mouth in the morning, at noon, in the evening, and at bedtime.     QUEtiapine (SEROQUEL) 200 MG tablet Take 200  mg by mouth at bedtime.      sertraline (ZOLOFT) 100 MG tablet Take 200 mg by mouth at bedtime.  0   doxycycline (VIBRA-TABS) 100 MG tablet Take 1 tablet (100 mg total) by mouth 2 (two) times daily. 14 tablet 0   famotidine (PEPCID) 20 MG tablet Take 20 mg by mouth at bedtime.     predniSONE (DELTASONE) 10 MG tablet Take 4 tablets (40 mg total) by mouth daily with breakfast for 2 days, THEN 3 tablets (30 mg total) daily with breakfast for 2 days, THEN 2 tablets (20 mg total) daily with breakfast for 2 days, THEN 1 tablet (10 mg total) daily with breakfast for 2 days. 20 tablet 0   promethazine-dextromethorphan (PROMETHAZINE-DM) 6.25-15 MG/5ML syrup Take 5 mLs by mouth 2 (two) times daily as needed for cough. Do not drive if sleepy 161 mL 0   No facility-administered medications prior to visit.     Review of Systems:   Constitutional: No weight loss or gain, night sweats, fevers, chills. +fatigue (due to being up at night from  coughing) HEENT: No headaches, difficulty swallowing, tooth/dental problems, or sore throat. No sneezing, itching, ear ache. +nasal congestion, post nasal drip (worse when laying down) CV:  No chest pain, orthopnea, PND, swelling in lower extremities, anasarca, dizziness, palpitations, syncope Resp: +shortness of breath with exertion (unchanged); persistent, bronchitic cough; occasional wheeze. No excess mucus or change in color of mucus. No productive or non-productive. No hemoptysis. No wheezing.  No chest wall deformity GI:  No heartburn, indigestion, abdominal pain, nausea, vomiting, diarrhea, change in bowel habits, loss of appetite, bloody stools.  Skin: No rash, lesions, ulcerations MSK:  No joint pain or swelling.  No decreased range of motion.  No back pain. Neuro: No dizziness or lightheadedness.  Psych: No depression or anxiety. Mood stable.     Physical Exam:  BP (!) 140/96 (BP Location: Right Arm, Patient Position: Sitting, Cuff Size: Large)   Pulse (!) 112   Temp 99.3 F (37.4 C) (Oral)   Ht 5\' 11"  (1.803 m)   Wt (!) 303 lb 12.8 oz (137.8 kg)   LMP 04/28/2015   SpO2 100%   BMI 42.37 kg/m   GEN: Pleasant, interactive, well-appearing; morbidly obese; in no acute distress. HEENT:  Normocephalic and atraumatic. EACs patent bilaterally. TM pearly gray with present light reflex bilaterally. PERRLA. Sclera white. Nasal turbinates erythematous, moist and patent bilaterally. Clear rhinorrhea present. Oropharynx erythematous and moist, without exudate or edema. No lesions, ulcerations.  NECK:  Supple w/ fair ROM. No JVD present. Normal carotid impulses w/o bruits. Thyroid symmetrical with no goiter or nodules palpated. No lymphadenopathy.   CV: RRR, no m/r/g, no peripheral edema. Pulses intact, +2 bilaterally. No cyanosis, pallor or clubbing. PULMONARY:  Unlabored, regular breathing. Clear bilaterally A&P w/o wheezes/rales/rhonchi. Bronchitic cough. No accessory muscle use. No  dullness to percussion. GI: BS present and normoactive. Soft, non-tender to palpation. No organomegaly or masses detected. No CVA tenderness. MSK: No erythema, warmth or tenderness. Cap refil <2 sec all extrem. No deformities or joint swelling noted.  Neuro: A/Ox3. No focal deficits noted.   Skin: Warm, no lesions or rashe Psych: Normal affect and behavior. Judgement and thought content appropriate.     Lab Results:  CBC    Component Value Date/Time   WBC 5.0 08/28/2021 0850   RBC 3.43 (L) 08/28/2021 0850   HGB 10.5 (L) 08/28/2021 0850   HCT 33.4 (L) 08/28/2021 0850   PLT 209 08/28/2021  0850   MCV 97.4 08/28/2021 0850   MCH 30.6 08/28/2021 0850   MCHC 31.4 08/28/2021 0850   RDW 14.6 08/28/2021 0850   LYMPHSABS 1.3 05/13/2019 1243   MONOABS 0.3 05/13/2019 1243   EOSABS 0.1 05/13/2019 1243   BASOSABS 0.0 05/13/2019 1243    BMET    Component Value Date/Time   NA 139 08/15/2021 1031   K 3.2 (L) 08/15/2021 1031   CL 105 08/15/2021 1031   CO2 26 08/15/2021 1031   GLUCOSE 108 (H) 08/15/2021 1031   BUN 15 08/15/2021 1031   CREATININE 0.95 08/15/2021 1031   CREATININE 0.78 07/14/2018 1024   CALCIUM 8.9 08/15/2021 1031   GFRNONAA >60 08/15/2021 1031   GFRNONAA 99 02/26/2018 1053   GFRAA >60 02/08/2020 1130   GFRAA 115 02/26/2018 1053    BNP No results found for: "BNP"   Imaging:  DG Chest 2 View  Result Date: 12/03/2021 CLINICAL DATA:  50 year old female with cough. Chronic lung disease. EXAM: CHEST - 2 VIEW COMPARISON:  High-resolution chest CT 08/27/2021 and earlier. FINDINGS: Borderline to mild cardiomegaly appears chronic and not significantly changed since 2012. Other mediastinal contours are within normal limits. Stable lung volumes. No pneumothorax, pulmonary edema, pleural effusion or confluent pulmonary opacity. No acute osseous abnormality identified. Negative visible bowel gas. IMPRESSION: No acute cardiopulmonary abnormality. Electronically Signed   By: Odessa Fleming M.D.   On: 12/03/2021 10:28    methylPREDNISolone acetate (DEPO-MEDROL) injection 80 mg     Date Action Dose Route User   12/03/2021 1021 Given 80 mg Intramuscular (Left Upper Outer Quadrant) Christen Butter, CMA          Latest Ref Rng & Units 03/05/2021    2:58 PM 07/27/2020    8:55 AM 11/24/2019   11:04 AM 03/08/2018   10:35 AM 12/01/2017    2:12 PM 06/08/2017    3:13 PM 02/13/2017   10:49 AM  PFT Results  FVC-Pre L 2.64  2.59  2.67  2.46  2.38  2.31  2.32   FVC-Predicted Pre % 73  72  73  67  61  59  59   Pre FEV1/FVC % % 85  88  83  84  83  81  84   FEV1-Pre L 2.24  2.28  2.21  2.07  1.98  1.87  1.94   FEV1-Predicted Pre % 77  78  75  70  62  59  61   DLCO uncorrected ml/min/mmHg 15.34  19.54  18.56   16.38  14.21  16.35   DLCO UNC% % 60  76  72   47  41  47   DLCO corrected ml/min/mmHg 15.34  19.54  20.32    14.89  15.02   DLCO COR %Predicted % 60  76  79    43  43   DLVA Predicted % 97  117  122   78  78  80     No results found for: "NITRICOXIDE"      Assessment & Plan:   Upper airway cough syndrome She has had minimal response to prednisone and antibiotics.  Her cough really seems to be related to upper airway irritation as she has significant worsening with increased postnasal drip.  Advised that we change her from [facility] to [place] to eliminate DPI.  Target postnasal drainage control with addition of Astelin nasal spray.  We will also target cough control measures.  Since she does have some shortness of  breath, persistent cough and tachycardia with minimal response to empiric treatments, recommended that we check D-dimer to rule out possible clot.  I am also going to check a CBC to evaluate for potential leukocytosis; previous imaging has been negative for superimposed infection. We will then determine necessity of CT scan.  Patient Instructions  Stop Breo. Start Breztri 2 puffs Twice daily. Brush tongue and rinse mouth afterwards  Continue Albuterol inhaler 2 puffs  or 3 mL neb every 6 hours as needed for shortness of breath or wheezing. Notify if symptoms persist despite rescue inhaler/neb use.  Continue zyrtec 1 tablet daily for allergies  Continue flonase 2 sprays each nostril daily  Continue singulair (montelukast) 10 mg At bedtime  Continue promethazine DM 5 mL every 6 hours as needed for cough   -Tessalon Perles (bezonatate) 1 capsule Three times a day for cough  -Astelin nasal spray 2 sprays each nostril Twice daily for nasal congestion/postnasal drainage  -Chlorpheniramine 4 mg tab over the counter At bedtime for cough. You can also take every 4-6 hours if this does not make you drowsy.  -Mucinex 600 mg Twice daily for chest congestion -Increase pepcid (famotidine) to 20 mg Twice daily   Avoid throat clearing. Take frequent sips of water, especially with the urge to cough. Suck on sugar free hard candies or non-menthol lozenges throughout the day.   Labs today - BMET, d dimer, CBC with diff. I will call you to discuss results and next steps.   Follow up in 1 week with Dr. Marchelle Gearing or Philis Nettle. If symptoms do not improve or worsen, please contact office for sooner follow up or seek emergency care.     Cough variant asthma  vs uacs  She said minimal response to multiple rounds of prednisone and Depo injections.  She does have some increased shortness of breath and notices some wheezing.  Lungs were clear to upon exam today without bronchospasm.  Advised that we hold off on any further prednisone at this point.  I did want to try stepping her up to triple therapy to see if she receives any benefit from this and also switch her off DPI inhaler.  Advised that she continue Singulair for trigger prevention.   ILD (interstitial lung disease) Underlying autoimmune ILD.  HRCT from March did not show any progression of disease; she had mild peripheral and basilar predominant reticular densities and groundglass which is favored to be NSIP.  Her cough  has been overall nonresponsive to steroids.  Do not think repeating would be beneficial at this point.  Her rheumatologist would prefer to keep her off of prednisone if at all possible as well.  See above plan.   I spent 35 minutes of dedicated to the care of this patient on the date of this encounter to include pre-visit review of records, face-to-face time with the patient discussing conditions above, post visit ordering of testing, clinical documentation with the electronic health record, making appropriate referrals as documented, and communicating necessary findings to members of the patients care team.  Noemi Chapel, NP 12/13/2021  Pt aware and understands NP's role.

## 2021-12-16 DIAGNOSIS — M329 Systemic lupus erythematosus, unspecified: Secondary | ICD-10-CM | POA: Diagnosis not present

## 2021-12-16 MED ORDER — AZELASTINE HCL 0.1 % NA SOLN: 2.0000 | Freq: Two times a day (BID) | NASAL | 12 refills | Status: DC

## 2021-12-18 DIAGNOSIS — M25571 Pain in right ankle and joints of right foot: Secondary | ICD-10-CM | POA: Diagnosis not present

## 2021-12-18 DIAGNOSIS — R69 Illness, unspecified: Secondary | ICD-10-CM | POA: Diagnosis not present

## 2021-12-18 DIAGNOSIS — M3213 Lung involvement in systemic lupus erythematosus: Secondary | ICD-10-CM | POA: Diagnosis not present

## 2021-12-18 DIAGNOSIS — F411 Generalized anxiety disorder: Secondary | ICD-10-CM | POA: Diagnosis not present

## 2021-12-18 DIAGNOSIS — G8929 Other chronic pain: Secondary | ICD-10-CM | POA: Diagnosis not present

## 2021-12-18 DIAGNOSIS — J849 Interstitial pulmonary disease, unspecified: Secondary | ICD-10-CM | POA: Diagnosis not present

## 2021-12-18 DIAGNOSIS — M25572 Pain in left ankle and joints of left foot: Secondary | ICD-10-CM | POA: Diagnosis not present

## 2021-12-18 DIAGNOSIS — F332 Major depressive disorder, recurrent severe without psychotic features: Secondary | ICD-10-CM | POA: Diagnosis not present

## 2021-12-18 DIAGNOSIS — M329 Systemic lupus erythematosus, unspecified: Secondary | ICD-10-CM | POA: Diagnosis not present

## 2021-12-18 DIAGNOSIS — M778 Other enthesopathies, not elsewhere classified: Secondary | ICD-10-CM | POA: Diagnosis not present

## 2021-12-19 ENCOUNTER — Ambulatory Visit
Admission: RE | Admit: 2021-12-19 | Discharge: 2021-12-19 | Disposition: A | Discharge status: Home / Self Care | Payer: Medicare HMO | Source: Ambulatory Visit | Attending: Specialist | Admitting: Specialist

## 2021-12-19 DIAGNOSIS — M5126 Other intervertebral disc displacement, lumbar region: Secondary | ICD-10-CM | POA: Diagnosis not present

## 2021-12-19 DIAGNOSIS — M4316 Spondylolisthesis, lumbar region: Secondary | ICD-10-CM | POA: Diagnosis not present

## 2021-12-19 DIAGNOSIS — M5416 Radiculopathy, lumbar region: Secondary | ICD-10-CM

## 2021-12-19 MED ORDER — ONDANSETRON HCL 4 MG/2ML IJ SOLN: 4.0000 mg | Freq: Once | INTRAMUSCULAR | Status: DC | PRN

## 2021-12-19 MED ORDER — IOPAMIDOL (ISOVUE-M 200) INJECTION 41%
20.0000 mL | Freq: Once | INTRAMUSCULAR | Status: AC
  Administered 2021-12-19: 20 mL via INTRATHECAL

## 2021-12-19 MED ORDER — DIAZEPAM 5 MG PO TABS
10.0000 mg | ORAL_TABLET | Freq: Once | ORAL | Status: AC
Start: 2021-12-19 — End: 2021-12-19
  Administered 2021-12-19: 10 mg via ORAL

## 2021-12-19 MED ORDER — MEPERIDINE HCL 50 MG/ML IJ SOLN: 50.0000 mg | Freq: Once | INTRAMUSCULAR | Status: DC | PRN

## 2021-12-19 NOTE — Discharge Instructions (Signed)

## 2021-12-20 ENCOUNTER — Ambulatory Visit: Payer: Medicare HMO | Admitting: Acute Care

## 2021-12-20 DIAGNOSIS — Z79891 Long term (current) use of opiate analgesic: Secondary | ICD-10-CM | POA: Diagnosis not present

## 2021-12-20 DIAGNOSIS — M25561 Pain in right knee: Secondary | ICD-10-CM | POA: Diagnosis not present

## 2021-12-20 DIAGNOSIS — M329 Systemic lupus erythematosus, unspecified: Secondary | ICD-10-CM | POA: Diagnosis not present

## 2021-12-20 DIAGNOSIS — G894 Chronic pain syndrome: Secondary | ICD-10-CM | POA: Diagnosis not present

## 2021-12-20 DIAGNOSIS — Z1389 Encounter for screening for other disorder: Secondary | ICD-10-CM | POA: Diagnosis not present

## 2021-12-20 DIAGNOSIS — M47816 Spondylosis without myelopathy or radiculopathy, lumbar region: Secondary | ICD-10-CM | POA: Diagnosis not present

## 2021-12-20 DIAGNOSIS — Z96651 Presence of right artificial knee joint: Secondary | ICD-10-CM | POA: Diagnosis not present

## 2021-12-20 DIAGNOSIS — Z96652 Presence of left artificial knee joint: Secondary | ICD-10-CM | POA: Diagnosis not present

## 2021-12-20 DIAGNOSIS — M1711 Unilateral primary osteoarthritis, right knee: Secondary | ICD-10-CM | POA: Diagnosis not present

## 2021-12-20 DIAGNOSIS — E669 Obesity, unspecified: Secondary | ICD-10-CM | POA: Diagnosis not present

## 2021-12-23 DIAGNOSIS — M329 Systemic lupus erythematosus, unspecified: Secondary | ICD-10-CM | POA: Diagnosis not present

## 2021-12-23 DIAGNOSIS — M545 Low back pain, unspecified: Secondary | ICD-10-CM | POA: Diagnosis not present

## 2021-12-25 DIAGNOSIS — M329 Systemic lupus erythematosus, unspecified: Secondary | ICD-10-CM | POA: Diagnosis not present

## 2021-12-27 DIAGNOSIS — M329 Systemic lupus erythematosus, unspecified: Secondary | ICD-10-CM | POA: Diagnosis not present

## 2021-12-30 DIAGNOSIS — M329 Systemic lupus erythematosus, unspecified: Secondary | ICD-10-CM | POA: Diagnosis not present

## 2022-01-01 DIAGNOSIS — M329 Systemic lupus erythematosus, unspecified: Secondary | ICD-10-CM | POA: Diagnosis not present

## 2022-01-02 DIAGNOSIS — M25561 Pain in right knee: Secondary | ICD-10-CM | POA: Diagnosis not present

## 2022-01-02 DIAGNOSIS — Z79891 Long term (current) use of opiate analgesic: Secondary | ICD-10-CM | POA: Diagnosis not present

## 2022-01-02 DIAGNOSIS — M1711 Unilateral primary osteoarthritis, right knee: Secondary | ICD-10-CM | POA: Diagnosis not present

## 2022-01-02 DIAGNOSIS — M329 Systemic lupus erythematosus, unspecified: Secondary | ICD-10-CM | POA: Diagnosis not present

## 2022-01-02 DIAGNOSIS — M79604 Pain in right leg: Secondary | ICD-10-CM | POA: Diagnosis not present

## 2022-01-02 DIAGNOSIS — Z96651 Presence of right artificial knee joint: Secondary | ICD-10-CM | POA: Diagnosis not present

## 2022-01-03 DIAGNOSIS — M329 Systemic lupus erythematosus, unspecified: Secondary | ICD-10-CM | POA: Diagnosis not present

## 2022-01-06 ENCOUNTER — Ambulatory Visit: Payer: Medicare HMO | Admitting: Pulmonary Disease

## 2022-01-06 DIAGNOSIS — M329 Systemic lupus erythematosus, unspecified: Secondary | ICD-10-CM | POA: Diagnosis not present

## 2022-01-08 DIAGNOSIS — M329 Systemic lupus erythematosus, unspecified: Secondary | ICD-10-CM | POA: Diagnosis not present

## 2022-01-10 DIAGNOSIS — M329 Systemic lupus erythematosus, unspecified: Secondary | ICD-10-CM | POA: Diagnosis not present

## 2022-01-12 DIAGNOSIS — Z79891 Long term (current) use of opiate analgesic: Secondary | ICD-10-CM | POA: Diagnosis not present

## 2022-01-12 DIAGNOSIS — G894 Chronic pain syndrome: Secondary | ICD-10-CM | POA: Diagnosis not present

## 2022-01-12 DIAGNOSIS — Z96651 Presence of right artificial knee joint: Secondary | ICD-10-CM | POA: Diagnosis not present

## 2022-01-12 DIAGNOSIS — M1711 Unilateral primary osteoarthritis, right knee: Secondary | ICD-10-CM | POA: Diagnosis not present

## 2022-01-12 DIAGNOSIS — M329 Systemic lupus erythematosus, unspecified: Secondary | ICD-10-CM | POA: Diagnosis not present

## 2022-01-12 DIAGNOSIS — M25561 Pain in right knee: Secondary | ICD-10-CM | POA: Diagnosis not present

## 2022-01-13 DIAGNOSIS — M329 Systemic lupus erythematosus, unspecified: Secondary | ICD-10-CM | POA: Diagnosis not present

## 2022-01-15 DIAGNOSIS — J849 Interstitial pulmonary disease, unspecified: Secondary | ICD-10-CM | POA: Diagnosis not present

## 2022-01-15 DIAGNOSIS — Z6841 Body Mass Index (BMI) 40.0 and over, adult: Secondary | ICD-10-CM | POA: Diagnosis not present

## 2022-01-15 DIAGNOSIS — M329 Systemic lupus erythematosus, unspecified: Secondary | ICD-10-CM | POA: Diagnosis not present

## 2022-01-17 DIAGNOSIS — M329 Systemic lupus erythematosus, unspecified: Secondary | ICD-10-CM | POA: Diagnosis not present

## 2022-01-20 DIAGNOSIS — M329 Systemic lupus erythematosus, unspecified: Secondary | ICD-10-CM | POA: Diagnosis not present

## 2022-01-22 DIAGNOSIS — M329 Systemic lupus erythematosus, unspecified: Secondary | ICD-10-CM | POA: Diagnosis not present

## 2022-01-30 DIAGNOSIS — G4710 Hypersomnia, unspecified: Secondary | ICD-10-CM | POA: Diagnosis not present

## 2022-01-30 DIAGNOSIS — G4733 Obstructive sleep apnea (adult) (pediatric): Secondary | ICD-10-CM | POA: Diagnosis not present

## 2022-02-03 DIAGNOSIS — M25561 Pain in right knee: Secondary | ICD-10-CM | POA: Diagnosis not present

## 2022-02-03 DIAGNOSIS — Z96651 Presence of right artificial knee joint: Secondary | ICD-10-CM | POA: Diagnosis not present

## 2022-02-03 DIAGNOSIS — E669 Obesity, unspecified: Secondary | ICD-10-CM | POA: Diagnosis not present

## 2022-02-03 DIAGNOSIS — M47816 Spondylosis without myelopathy or radiculopathy, lumbar region: Secondary | ICD-10-CM | POA: Diagnosis not present

## 2022-02-03 DIAGNOSIS — Z96652 Presence of left artificial knee joint: Secondary | ICD-10-CM | POA: Diagnosis not present

## 2022-02-03 DIAGNOSIS — Z1389 Encounter for screening for other disorder: Secondary | ICD-10-CM | POA: Diagnosis not present

## 2022-02-03 DIAGNOSIS — Z79891 Long term (current) use of opiate analgesic: Secondary | ICD-10-CM | POA: Diagnosis not present

## 2022-02-03 DIAGNOSIS — M1711 Unilateral primary osteoarthritis, right knee: Secondary | ICD-10-CM | POA: Diagnosis not present

## 2022-02-10 DIAGNOSIS — M7542 Impingement syndrome of left shoulder: Secondary | ICD-10-CM | POA: Diagnosis not present

## 2022-02-10 DIAGNOSIS — M545 Low back pain, unspecified: Secondary | ICD-10-CM | POA: Diagnosis not present

## 2022-02-12 DIAGNOSIS — M329 Systemic lupus erythematosus, unspecified: Secondary | ICD-10-CM | POA: Diagnosis not present

## 2022-02-12 DIAGNOSIS — M25561 Pain in right knee: Secondary | ICD-10-CM | POA: Diagnosis not present

## 2022-02-12 DIAGNOSIS — G894 Chronic pain syndrome: Secondary | ICD-10-CM | POA: Diagnosis not present

## 2022-02-12 DIAGNOSIS — Z96651 Presence of right artificial knee joint: Secondary | ICD-10-CM | POA: Diagnosis not present

## 2022-02-12 DIAGNOSIS — Z79891 Long term (current) use of opiate analgesic: Secondary | ICD-10-CM | POA: Diagnosis not present

## 2022-02-12 DIAGNOSIS — M1711 Unilateral primary osteoarthritis, right knee: Secondary | ICD-10-CM | POA: Diagnosis not present

## 2022-02-18 DIAGNOSIS — R69 Illness, unspecified: Secondary | ICD-10-CM | POA: Diagnosis not present

## 2022-02-18 DIAGNOSIS — F411 Generalized anxiety disorder: Secondary | ICD-10-CM | POA: Diagnosis not present

## 2022-02-18 DIAGNOSIS — F332 Major depressive disorder, recurrent severe without psychotic features: Secondary | ICD-10-CM | POA: Diagnosis not present

## 2022-02-25 ENCOUNTER — Ambulatory Visit: Payer: Medicare HMO | Admitting: Pulmonary Disease

## 2022-02-25 NOTE — Progress Notes (Signed)
ILD on ct chest march 2023 unchanged since 2021. Will discuss Sept 2023 visit

## 2022-02-28 DIAGNOSIS — Z1231 Encounter for screening mammogram for malignant neoplasm of breast: Secondary | ICD-10-CM | POA: Diagnosis not present

## 2022-03-03 DIAGNOSIS — M1711 Unilateral primary osteoarthritis, right knee: Secondary | ICD-10-CM | POA: Diagnosis not present

## 2022-03-03 DIAGNOSIS — Z1389 Encounter for screening for other disorder: Secondary | ICD-10-CM | POA: Diagnosis not present

## 2022-03-03 DIAGNOSIS — Z79891 Long term (current) use of opiate analgesic: Secondary | ICD-10-CM | POA: Diagnosis not present

## 2022-03-03 DIAGNOSIS — Z96652 Presence of left artificial knee joint: Secondary | ICD-10-CM | POA: Diagnosis not present

## 2022-03-03 DIAGNOSIS — M25561 Pain in right knee: Secondary | ICD-10-CM | POA: Diagnosis not present

## 2022-03-03 DIAGNOSIS — Z96651 Presence of right artificial knee joint: Secondary | ICD-10-CM | POA: Diagnosis not present

## 2022-03-03 DIAGNOSIS — G894 Chronic pain syndrome: Secondary | ICD-10-CM | POA: Diagnosis not present

## 2022-03-03 DIAGNOSIS — E669 Obesity, unspecified: Secondary | ICD-10-CM | POA: Diagnosis not present

## 2022-03-03 DIAGNOSIS — M47816 Spondylosis without myelopathy or radiculopathy, lumbar region: Secondary | ICD-10-CM | POA: Diagnosis not present

## 2022-03-05 DIAGNOSIS — R2681 Unsteadiness on feet: Secondary | ICD-10-CM | POA: Diagnosis not present

## 2022-03-06 DIAGNOSIS — M479 Spondylosis, unspecified: Secondary | ICD-10-CM | POA: Diagnosis not present

## 2022-03-06 DIAGNOSIS — M545 Low back pain, unspecified: Secondary | ICD-10-CM | POA: Insufficient documentation

## 2022-03-06 DIAGNOSIS — M25512 Pain in left shoulder: Secondary | ICD-10-CM | POA: Diagnosis not present

## 2022-03-06 DIAGNOSIS — M47819 Spondylosis without myelopathy or radiculopathy, site unspecified: Secondary | ICD-10-CM | POA: Insufficient documentation

## 2022-03-06 DIAGNOSIS — M5416 Radiculopathy, lumbar region: Secondary | ICD-10-CM | POA: Diagnosis not present

## 2022-03-10 ENCOUNTER — Encounter: Payer: Self-pay | Admitting: Internal Medicine

## 2022-03-11 MED ORDER — CETIRIZINE HCL 10 MG PO TABS
10.0000 mg | ORAL_TABLET | Freq: Every day | ORAL | 2 refills | Status: DC
Start: 2022-03-11 — End: 2022-06-09

## 2022-03-13 DIAGNOSIS — M25512 Pain in left shoulder: Secondary | ICD-10-CM | POA: Diagnosis not present

## 2022-03-15 DIAGNOSIS — M25561 Pain in right knee: Secondary | ICD-10-CM | POA: Diagnosis not present

## 2022-03-15 DIAGNOSIS — Z96651 Presence of right artificial knee joint: Secondary | ICD-10-CM | POA: Diagnosis not present

## 2022-03-15 DIAGNOSIS — G894 Chronic pain syndrome: Secondary | ICD-10-CM | POA: Diagnosis not present

## 2022-03-15 DIAGNOSIS — M1711 Unilateral primary osteoarthritis, right knee: Secondary | ICD-10-CM | POA: Diagnosis not present

## 2022-03-15 DIAGNOSIS — Z79891 Long term (current) use of opiate analgesic: Secondary | ICD-10-CM | POA: Diagnosis not present

## 2022-03-15 DIAGNOSIS — M329 Systemic lupus erythematosus, unspecified: Secondary | ICD-10-CM | POA: Diagnosis not present

## 2022-03-18 ENCOUNTER — Ambulatory Visit (INDEPENDENT_AMBULATORY_CARE_PROVIDER_SITE_OTHER): Payer: Medicare HMO | Admitting: Internal Medicine

## 2022-03-18 ENCOUNTER — Encounter: Payer: Self-pay | Admitting: Internal Medicine

## 2022-03-18 VITALS — BP 124/82 | HR 113 | Temp 98.3°F | Ht 71.0 in | Wt 308.8 lb

## 2022-03-18 DIAGNOSIS — Z23 Encounter for immunization: Secondary | ICD-10-CM

## 2022-03-18 DIAGNOSIS — J849 Interstitial pulmonary disease, unspecified: Secondary | ICD-10-CM

## 2022-03-18 NOTE — Patient Instructions (Addendum)
Interstitial lung disease due to connective tissue disease ([place]) High risk medication use Immunosuppressed status  STable CT March 2023 Clinically stable with symptoms Sept 2023t. Marland Kitchen    Plan ' -Continue CellCept through the rheumatologist at [place] through the rheumatologist at [place] that you are off prednisone since 2021  -If pulmonary fibrosis gets worse then we can add nintedanib and consider you for research registry study called ILD-Pro -Get spirometry and DLCO in 6 months   History of snoring Witnessed apneic spells OSA    - glad you are seeing Dr Jenetta Downer and are on CPAP  Plan  - per Dr Jenetta Downer  Vaccine counseling  PLAN  - flu shot 03/18/2022 - covid mRNA boooster in the fall 2023 - recommend new booster mRNA vaccine   - Please talk to PCP [name], [name], PA-C -  and ensure you get  shingrix (GSK) inactivated vaccine against shingles  Follow-up -Spirometry and DLCO in 6 months -Return to see Dr. Chase Caller 30-minute ILD slot in 6 months  - symptoms score and walk test in 6 months

## 2022-03-18 NOTE — Progress Notes (Signed)
PCP [name],[name], PA-C Referred by Dr [name]  HPI   IOV 08/23/2014 His blood work and a CT scan in a few weeks and come back c Chief Complaint  Patient presents with   Pulmonary Consult    Pt referred by Dr. Corliss [name] for ILD. Pt c/o SOB with activity and rest, dry cough and chest tightness also with and without activity.    51 year old female referred for evaluation of autoimmune interstitial lung disease. She presents with her husband. In 2010 while living in [place] she reports she was diagnosed with lupus associated with rheumatoid arthritis in her joints. In 2012 she moved to live in [facility] and several months after that started noticing insidious onset of shortness of breath. Local rheumatologist diagnosed her with interstitial lung disease. She was referred to Dr. Herma [name] at [facility] in [facility] and was started on CellCept/prednisone for autoimmune interstitial lung disease he and however, sometime around 2 years ago she ran out of medical insurance and stopped taking these medications. During this time her dyspnea has progressed. It is currently rated as moderate to severe. It is present on exertion and relieved by rest. Even minimal amount of exertion makes her very ddyspneic. Now she has insurance and she did see Dr. Frederik [name] locally and she has autoimmune panel lab ordered. In addition exam revealed crackles and there for she's been referred here for reevaluation of interstitial lung disease and dyspnea. Dyspnea is associated with some chest tightness but no chest pain. This no associated dizzine   11/24/2014 Follow UP ILD  [name]  She has autoimmune ILD with RA/Lupus  She was seen 6 weeks ago, restarted on Cellcept.  Previously on cellcept but lost her insurance until recently.  On low dose prednisone 5mg  daily .  Last CT chest 4/4 showed ILD changes similar to 2013. Echo was ok with EFG 55-60%, nml PAP .  In March .   Did not take dapsone ,due to  GP6D deficiency.  Labs ok last week with nml LFT , no sign change in hbg. /wbc.  She is feeling better. Does feel her breathing is some better.  No flare of cough or wheezing.     OV 01/16/2015  Chief Complaint  Patient presents with   Follow-up    Pt c/o DOE, mild dry cough, and chest tightness when SOB. Pt states the chest tightness has improved).    follow-up interstitial lung disease in the setting of rheumatoid arthritis Follow-up high risk medication use - on CellCept and prednisone since mid April 2016  - Presents with her husband. Both give a history. Overall she is doing better in terms of dyspnea after starting CellCept and prednisone. However the improvement this only moderate. She still left with a residual moderate dyspnea on exertion that is also made worse with bending or heart air and improved with rest and cool air. It is associated with some cough and wheezing. She takes albuterol inhaler which she feels helps only somewhat but not fully and not quickly enough. She is frustrated by this. In addition she's complaining of some associated right lower back paraspinal spasm for which massage gives her relief. He has never attended pulmonary rehabilitation.  - Lab review shows she has had problems with potassium and has had potassium supplementation. Last lab check was 12/08/2014. She is due for lab test right now   OV 03/28/2015   Chief Complaint  Patient  presents with   Follow-up    3 month follow up. Pt states that she is still having some problems with her breathing. Pt c/o of feeling chest tightness, chest pain and cough that is dry. Pt denies wheeze. Pt states that she did trial the Advair and does feel that it helped some.   Follow-up interstitial lung disease secondary to autoimmune process and associated dyspnea that seems out of proportion\  She presents with her husband. She continues on CellCept and prednisone. In the  last visit approximately 2-3 months ago she had out of proportion dyspnea. I gave her some Advair to try. She says this only helped a little bit. Overall she says that dyspnea still persists. It is worse than in the spring 2016 when she started CellCept and prednisone. It is stable since July 2016. It is moderate in intensity. Occurs randomly at rest but also with exertion. Occasionally relieved by rest but also happens at rest. Heat and humidity make it worse. Advair helped a little bit only. This no associated chest tightness or wheezing. She did try and enroll  in pulmonary rehabilitation but could not afford the the startup program and is waiting to hear from them for the maintenance program. She is frustrated by all the symptoms.  Pulmonary function test October 15,016 today FVC 2.55 L/64%, total lung capacity 4 L/65% and diffusion 14.5/42%. Overall no change since April 2016   OV 07/04/2015  Chief Complaint  Patient presents with   Follow-up    pt. states breathing is baseline. SOB. dry cough. wheezing. occ. chest pain/tightness. feels her airway is blocked.   Follow-up interstitial lung disease admitted autoimmune processes and associated out of proportion dyspnea  She presents again with her husband. She continues on CellCept 2 g twice daily associated with prednisone. She cannot take Bactrim or dapsone due to G6PD deficiency. Last seen in the fall of 2016. She was having out of proportion dyspnea. Rated cardia pulmonary stress test and she could not tolerate this test. She then underwent right heart catheterization mid-November 2016 with Dr. Marca Ancona. Review the tests this is normal. Overall stable but she and husband still continued to complain about this resting dyspnea associated with wheezing. They hear noises in the chest. Sometime she gasps for air even at rest. She feels it comes from the chest but the husband points to the throat. I offered second opinion at [facility] because  of this unusual symptoms but they declined citing distance. She was supposed to attend pulmonary rehabilitation but they cannot afford a $60 co-pay twice a week 8 weeks. Offered ENT evaluation that agreeable but they wanted done in [place] which is closer logistically. In addition patient is contemplating now applying for disability. 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  Walking desaturation test 185 feet 3 laps and rheumatic: No desaturation    OV 10/12/2015  Chief Complaint  Patient presents with   Follow-up    PFT today.  breathing is better.  Pt is currently on STD, [employer] approved STD until today, wants to know today if pt is able to return to work, any restrictions.  Pt states that she has been more stable while out of work.      Follow-up interstitial lung disease due to autoimmune processes not otherwise specified  Last seen January 2017. Since then she has been on short-term disability and out of work. She does heavy manual work. The lack of work estimated dyspnea better. She did  pulmonary function test today that I personally visualized and overall it is same. There are no new issues. She is on CellCept and she is tolerating this fine. Last visit she had some anemia we follow this up and the anemia was better. Also hypokalemia resolved by February 2017. She is due for blood work today. 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 This showed up at last visit. She saw an ENT in [place] within referred her to [facility]. She does not want to go to [facility]. She ended up seeing Dr. Lucia Gaskins locally. But now she is going to see Dr.  Otilio [facility]   Pulmonary function test today 10/12/2015 shows postbronchodilator FVC 2.65 L/67%. FEV1 2.34 L/72% which is up 17% positive bronchodilator response. Ratio of 80/106%. Total lung capacity of 3.8/62%. DLCO 18.36/52%.  Overall consistent with restriction and lung parenchymal disease. Overall PFTs are stable compared to April 2016 but perhaps in DLCO slightly better   OV 04/15/2016  Chief Complaint  Patient presents with   Interstitial Lung Disease    Breathing is unchanged since last OV. Reports SOB, coughing. Cough is non productive. Denies chest tightness or wheezing.   Follow-up interstitial lung disease due to autoimmune processes not otherwise specified. On CellCept and prednisone. Not on Bactrim prophylaxis due to G6PD deficiency  Last visit April 2017. At that time based on pulmonary function tests showed stability and ILD for a year. 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 She did see local ENT doctor [place] and apparently has been reassured. At this point in time she is applying for long-term disability at work but the work discharged from services and she is now applying for [place] disability. Her dyspnea stable since the interim. She's also had hysterectomy but for the last few weeks her cough is worse and it is dry. This no fever. Is no weight loss or chills. She says she's compliant with her CellCept and prednisone.  OV 05/29/2016  Chief Complaint  Patient presents with   Follow-up    Pt states she still has harsh dry cough, pt states her SOB is unchanged and chest tightness when SOB or when coughing a lot. Pt deneis f/c/s.     Follow-up interstitial lung disease due to autoimmune processes not otherwise specified. On CellCept and prednisone since April 2016. Not on Bactrim prophylaxis due to G6PD deficiency  Ezri returns for follow-up. She had high resolution CT chest that shows stability and interstitial lung disease since April 2016. She Pulmicort function test that shows mild improvement since April 2016. Therefore it appears that her CellCept and prednisone is helping her she'll lung disease related to collagen-vascular disease. However she  tells me that she continues to have chronic cough for the last few to several months. It is progressive. It is dry. It is worse than her baseline. The dyspnea is unchanged. This no fever or weight loss or chills   OV 07/24/2016  Chief Complaint  Patient presents with   Follow-up    Pt states she feels the Arnuity has been helping her breathing. Pt c/o dry cough and occ chest tightness.   rec    ICD-9-CM ICD-10-CM   1. ILD (interstitial lung disease) ([place]) 515 J84.9   2. Chronic cough 786.2 R05   3. High risk medication use V58.69 Z79.899    Clinically improved  with cellcept/prednisone and arnuity Blood work ok dec 2017  pLAN - start pulm rehab at [facility] - continue  aruity daily - continue cellcept and prednisone  Followup - 6 months do Pre-bd spiro and dlco only. No lung volume or bd response. No post-bd spiro - 6 months fu Dr Marchelle Gearing after above or sooner if needed     12/15/2016  f/u ov/Wert re: cough dry on arnuity  Chief Complaint  Patient presents with   Follow-up    Breathing is unchanged since her last visit. Pt states she is here to f/u on recent ABG result. She c/o feeling tired all of the time. She has occ cough- non prod.       Finished at Rehab   beginning  Of May  2018 and trouble walking fast or uphill Waking up with ha's since rehab sev days a week  Cough is new x one month mostly dry and day > noct   No obvious day to day or daytime variability or assoc excess/ purulent sputum or mucus plugs or hemoptysis or cp or chest tightness, subjective wheeze or overt sinus or hb symptoms. No unusual exp hx or h/o childhood pna/ asthma or knowledge of premature birth.   OV 02/13/2017  Chief Complaint  Patient presents with   Follow-up    cough/ILD follow up - prod cough with brownish/green mucus with tightness and chest pain x2 days.  denies any f/c/s, hemoptysis.  PFT done today.    Follow-up interstitial lung disease due to autoimmune processes not  otherwise specified. On CellCept and prednisone since April 2016. Not on Bactrim prophylaxis due to G6PD deficiency  51 year old female immunosuppressed with CellCept and prednisone. Not seen her in many months. Currently taking prednisone 3 mg per day and CellCept 1000 mg twice daily. She cannot do Bactrim prophylaxis because of G6PD deficiency. She tells me that overall her health has been stable but in the last few weeks noticing increased shortness of breath in the last few days this increased cough and change in color of sputum to green and increased chest tightness and a feeling that she is getting acute bronchitis. She also was contemplated going to the emergency room a few days ago but now she is better. There is no obvious fever or chills or hemoptysis or edema paroxysmal nocturnal dyspnea or orthopnea. Pulmonary function test today shows 10% decline in FVC and DLCO compared to baseline.   OV 06/08/2017  Chief Complaint  Patient presents with   Follow-up    Feeling about the same as last visit. Still having chest tightness and wheezing at times, Sounds hoarse.     Follow-up interstitial lung disease due to autoimmune processes not otherwise specified. On CellCept and prednisone since April 2016. Not on Bactrim prophylaxis due to G6PD deficiency. Normal Right heart cath Nov 2016.  Last high-resolution CT November 2017  Last visit August 2018.  There is a routine follow-up.  Overall she feels stable.  FVC shows stability since August 2018 but declined since 1 year ago.  DLCO shows continued decline.  Though her lung health is stable.  She says she lost her 1 only biological sister last week due to lupus.  The sister was only 36 and lived in [place].  There are no new issues. Walking desaturation test on 06/08/2017 185 feet x 3 laps on ROOM AIR:  did not desaturate. Rest pulse ox was 100%%, final pulse ox was 100%. HR response 81/min at rest to 109/min at  peak exertion. Patient SHIRLEYANN MONTERO  Did not Desaturate < 88% . Shannon [name] did nto  Desaturated </= 3% points. EPHRATA VERVILLE yes did get tachyardic   OV 10/15/2017   Follow-up interstitial lung disease due to autoimmune processes not otherwise specified. On CellCept and prednisone since April 2016. Not on Bactrim prophylaxis due to G6PD deficiency. Normal Right heart cath Nov 2016 and feb 2019.  Last high-resolution CT November 2017 and dec 2018 without progresion   Last visit December 2018.  This is a routine follow-up.  In the interim she had high-resolution CT scan of the chest that did not show any progression in interstitial lung disease between 2017 and 2018.Marland Kitchen  Therefore we did an echocardiogram that showed slight elevation in pulmonary artery systolic pressure.  Therefore we sent it to her repeat right heart catheterization and this was normal as documented below.  Overall she feels stable compared to one year ago but has declined compared to 2 years ago.  She is also complaining about a new recurrence of cough that happens mostly at night despite Symbicort and prednisone and CellCept.  It wakes her up.  It is moderate in intensity.  There is no associated wheezing or edema orthopnea.  It happens randomly.  There is associated heartburn.  She is on nothing for acid reflux.       Right Heart Pressures 08/20/17 [facility] Procedural Findings: Hemodynamics (mmHg) RA mean 3 RV 30/6 PA 23/8, mean 14 PCWP mean 8  Oxygen saturations: PA 72% AO 98%  Cardiac Output (Fick) 6.73  Cardiac Index (Fick) 3.06  Cardiac Output (Thermo) 6.94 Cardiac Index (Thermo) 3.15    OV 12/01/2017  Chief Complaint  Patient presents with   Follow-up    Pt has SOB with exertion, wheezing, some chest tightness. Pt was dry cough more at bedtime.      Follow-up interstitial lung disease due to autoimmune processes not otherwise specified. On CellCept and prednisone since April 2016. Not on Bactrim prophylaxis due to G6PD deficiency.  Normal Right heart cath Nov 2016 and feb 2019.  Last high-resolution CT November 2017 and dec 2018 without progresion   This visit is to see if she is got progressive interstitial lung disease.  Last visit she was complaining of more cough.  Therefore we added some acid reflux treatment.  She says after the acid reflux treatment symptoms have actually improved.  This contradicts what she told the CMA at intake.  Overall she is feeling stable.  However she does have a new episode of orthostatic dizziness going on at a mild level for the last 1 week.  Today after doing pulmonary function test she did feel dizzy.  Therefore we checked orthostatics on her and she got tachycardic standing up and her blood pressure did drop although still within normal limits.  She does not have any chest pain.  Lung function test shows continued stability in the last 1 year including compared to the most recent attempt.  OV 03/08/2018  Subjective:  Patient ID: Shannon [name], female , DOB: 1971-04-01 , age 10 y.o. , MRN: [id number] , ADDRESS: [address]  [place] [facility] [address]   03/08/2018 -   Chief Complaint  Patient presents with   Follow-up    Spirometry performed today. Pt states she is still having some mild problems with dizziness but not as bad as last visit. Pt also states she has been having problems with headaches x1 month. Pt states her breathing  is about the same as last visit and also states she still has the dry cough.    Follow-up interstitial lung disease due to autoimmune processes not otherwise specified. On CellCept and prednisone since April 2016. Not on Bactrim prophylaxis due to G6PD deficiency. Normal Right heart cath Nov 2016 and feb 2019.  Last high-resolution CT November 2017 and dec 2018 without progresion     HPI TONIQUA MELAMED 51 y.o. - connective tissue disease ILD. Follow-up. Last seen June 2019 by she started having new onset dizziness. It seemed orthostatic. She says since then it is  spontaneously improved but still persists. She is also dealing with fatigue issues. In 02/26/2018 she felt she had a lupus flare saw Dr. Algis Downs and given a prednisone burst. Autoimmune profile at that time showed continued positive ANA titer but normal complements. She had a CBC that showed a drop in hemoglobin by 1 g percent. Her baseline is around 11.5 g percent and currently it is around 10.5 g percent. She says after the prednisone burst a lupus flare symptoms and fatigue have improved although it still persists. If it is actually worse in the last few months. She had pulmonary function test that shows continued improvement especially following the recent prednisone burst. Walking desaturation test was normal other than tachycardia. We observed her to be walking very slowly in a very deconditioned and fatigued fashion. 02/26/2089 and liver function and renal function reviewed and this was normal. Medication review shows she is on oxycodone and gabapentin for back pain. She says she's been on gabapentin for the last 1 year. It appears that the temporal sequence of fatigue and dizziness might be related to gabapentin but she is not so sure.       Results for LEOTTA, WEINGARTEN (MRN [id number]) as of 03/08/2018 10:51  Ref. Range 02/26/2018 10:53  Hemoglobin Latest Ref Range: 11.7 - 15.5 g/dL 40.9 (L)  Results for KENSY, BLIZARD (MRN [id number]) as of 03/08/2018 10:51  Ref. Range 02/26/2018 10:53  Creatinine Latest Ref Range: 0.50 - 1.10 mg/dL 9.56  Results for DYLIN, IHNEN (MRN [id number]) as of 03/08/2018 10:51  Ref. Range 02/26/2018 10:53  AST Latest Ref Range: 10 - 35 U/L 19  ALT Latest Ref Range: 6 - 29 U/L 18   Results for ADALYNN, CORNE (MRN [id number]) as of 03/08/2018 10:51  Ref. Range 02/26/2018 10:53  Anti Nuclear Antibody(ANA) Latest Ref Range: NEGATIVE  POSITIVE (A)  ANA Pattern 1 Unknown SPECKLED (A)  ANA Titer 1 Latest Units: titer > OR = 1:1280 (A)  ds DNA Ab Latest Units: IU/mL <1  C3  Complement Latest Ref Range: 83 - 193 mg/dL 413  C4 Complement Latest Ref Range: 15 - 57 mg/dL 33    ROS - per HPI    OV 06/03/2018  Subjective:  Patient ID: Shannon [name], female , DOB: February 04, 1971 , age 13 y.o. , MRN: [id number] , ADDRESS: [address]  [place] [facility] [id number]   06/03/2018 -   Chief Complaint  Patient presents with   Follow-up    pt states breathing is baseline. c/o sob with exertion, non prod cough & wheezing    Follow-up interstitial lung disease due to autoimmune processes not otherwise specified. On CellCept and prednisone since April 2016. Not on Bactrim prophylaxis due to G6PD deficiency. Normal Right heart cath Nov 2016 and feb 2019.  Last high-resolution CT November 2017 and dec 2018 without progresion   HPI ANNAELLE KASEL 51 y.o. -  presents for follow-up.  Last visit September 2019.  At that time CBC showed anemia.  We repeated the hemoglobin 1 month later and was stable around 10 g%.  She tells me that overall she is stable.  Last visit she had dizziness and I told her to stop gabapentin which she did and her dizziness and fatigue have improved.  She feels she is stable but in the last 3 days she has had a dry cough but no fever or congestion or wheezing or hemoptysis or edema.  No worsening shortness of breath or chest tightness.  Other than that she feels good.  She did up walking desaturation test today and it appears similar to previous visit.       OV 05/13/2019  Subjective:  Patient ID: Shannon [name], female , DOB: 1970-10-19 , age 39 y.o. , MRN: [id number] , ADDRESS: [address] [place] [address]   05/13/2019 -   Chief Complaint  Patient presents with   Follow-up    Pt just had total left knee replacement mid October 2020. Pt said she still has problems with SOB especially with going upstairs. Pt also has occ wheezing but denies any real complaints of cough.   Follow-up interstitial lung disease due to autoimmune processes not  otherwise specified. On CellCept and prednisone since April 2016. Not on Bactrim prophylaxis due to G6PD deficiency. Normal Right heart cath Nov 2016 and feb 2019.  Last high-resolution CT November 2017 and dec 2018 without progression. Last PFT Sept 2019  HPI AIVAH PUTMAN 51 y.o. -returns for follow-up.  Last seen in September 2019 and because of the pandemic things got delayed.  She was supposed to see me in 6 months with spirometry and high-resolution CT chest for progression but this did not happen.  At this follow-up we do not have those data points as yet.  She tells me the interim she has had knee surgery and therefore she is not able to do a simple walk test with [place].  She has gained some weight.  Also in the interim she is now on Social Security disability and she is unemployed now.  She feels her shortness of breath is slightly worse particularly when walking up stairs but otherwise overall she feels stable.  I reviewed her blood work.  And it seems anemia is worse in the 9 g%.  She really denies any bleeding.  Normally she runs between 10 and 11 g%.  I noticed that she still not had a shingles vaccine.   OV 01/09/2020   Subjective:  Patient ID: Shannon [name], female , DOB: [birth year], age 61 y.o. years. , MRN: [id number],  ADDRESS: [address] [place] [address] PCP  Eunice Blase, PA-C Providers : Treatment Team:  Attending Provider: Kalman Shan, MD    Follow-up interstitial lung disease due to autoimmune processes not otherwise specified. On CellCept and prednisone since April 2016. Not on Bactrim prophylaxis due to G6PD deficiency. Normal Right heart cath Nov 2016 and feb 2019.  Last high-resolution CT November 2017 and dec 2018 and dec 2020 without progression. Last PFT Sept 2019 and now June 2021  Chief Complaint  Patient presents with   Follow-up       HPI MIKEA QUADROS 51 y.o. -returns for follow-up.  Since her last visit she continues to be stable.   She is only on CellCept.  She is also on Plaquenil.  She is not on prednisone or antifibrotic's.  Her dyspnea is stable.  Her pulmonary function test is improved and a CT chest shows stability.  The main issue is that she says over the last 3 years she has gained 30 to 40 pounds of weight.  She says that she is snoring a lot according to her husband.  Apparently the husband's also noticed apneic spells.  She says she has had the Covid vaccine but is not documented in our chart.  She is yet to have the Shingrix vaccine.       Results for LILIAS, LORENSEN (MRN [id number]) as of 02/13/2017 12:16  Ref. Range 10/09/2014 09:39 03/28/2015 11:40 10/12/2015 13:42 05/06/2016 10:38 02/13/2017 10:49 06/08/2017  12/01/2017  03/08/2018  11/24/19  FVC-Pre Latest Units: L 2.54 2.55 2.53 2.63 2.32 2.31 2.38 2.46 2.67  FVC-%Pred-Pre Latest Units: % 64 64 64 67 59 59% 61% 67% 73%   Results for JAKYAH, BRADBY (MRN [id number]) as of 02/13/2017 12:16  Ref. Range 10/09/2014 09:39 03/28/2015 11:40 10/12/2015 13:42 05/06/2016 10:38 02/13/2017 10:49 06/08/2017  12/01/2017  03/08/2018  11/24/19  DLCO unc Latest Units: ml/min/mmHg 15.03 14.50 18.36 18.31 16.35 14.21 16.38 x 18.56  DLCO unc % pred Latest Units: % 43 42 53 53 47 41 47% x 72%    ROS - per HPI Results for RAYLEI, LOSURDO (MRN [id number]) as of 01/09/2020 16:33  Ref. Range 12/21/2019 22:12  Creatinine Latest Ref Range: 0.44 - 1.00 mg/dL 6.80  Results for NATAJAH, DERDERIAN (MRN [id number]) as of 01/09/2020 16:33  Ref. Range 12/21/2019 22:12  Hemoglobin Latest Ref Range: 12.0 - 15.0 g/dL 00.3 (L)   IMPRESSION: dec 2020 compared to 2018 1. The appearance of the lungs remains compatible with interstitial lung disease, with a spectrum of findings considered indeterminate for usual interstitial pneumonia (UIP) per current ats guidelines. However, given the spectrum of findings and the stability of the findings compared to the prior study, this is once again strongly favored to  represent nonspecific interstitial pneumonia (NSIP).     Electronically Signed   By: Trudie Reed M.D.   On: 06/15/2019 09:42 ROS - per HPI    OV 07/27/2020  Subjective:  Patient ID: Shannon [name], female , DOB: Dec 29, 1970 , age 55 y.o. , MRN: [id number] , ADDRESS: [address] [place] [address] PCP Eunice Blase, PA-C Patient Care Team: Otila Back as PCP - General (Internal Medicine)  This Provider for this visit: Treatment Team:  Attending Provider: Kalman Shan, MD    07/27/2020 -   Chief Complaint  Patient presents with   Follow-up    Get pft results.   Follow-up interstitial lung disease due to autoimmune processes not otherwise specified. -Lupus per [facility] clinic notes in December 2021.   - On CellCept and prednisone since April 2016.  Off prednisone in 2021  Not on Bactrim prophylaxis due to G6PD deficiency.    - Normal Right heart cath Nov 2016 and feb 2019  -  Last high-resolution CT November 2017 and dec 2018 and dec 2020 without progression.    - Last PFT Sept 2019 and now June 2021 and feb 2022  Chief Complaint  Patient presents with   Follow-up      HPI CHANTELE CORADO 51 y.o. -returns for routine ILD follow-up.  Last seen in July 2021.  Clinically she is stable.  Since last seeing me November 2020 when she had right knee surgery.  She uses a cane.  She is also seen Dr.  Olin in sleep clinic.  She is now on CPAP and it is helping her.  Her weight gain persists.  Her mobility has therefore gone down but from a dyspnea standpoint she is stable.  She is up-to-date with her COVID vaccine.  With the booster she did get symptomatic with Sirs response.  She continues on CellCept.  She is now seeing [facility] rheumatology.  She does not see [facility] anymore.  She believes she has lupus.  She has an appointment upcoming in a few days.  Review of the records at [facility] indicate t that she has lupus.  She continues to  be off prednisone.  She is unable to do a walk test today because of her knee issues and also she is on a cane.  She has pulmonary function test there is a 2-3.8% decline in FVC/DLCO.  This could easily be because of weight gain.  Her symptom scores are stable.  She did have CT angiogram in the summer 2021 this ruled out pulmonary embolism.  There is no comment about ILD.  But then it is a contrast CT.    OV 03/05/2021  Subjective:  Patient ID: Shannon [name], female , DOB: 03/29/1971 , age 81 y.o. , MRN: [id number] , ADDRESS: [address] [place] [address] PCP Eunice Blase, PA-C Patient Care Team: Otila Back as PCP - General (Internal Medicine)  This Provider for this visit: Treatment Team:  Attending Provider: Kalman Shan, MD    03/05/2021 -   Chief Complaint  Patient presents with   Follow-up    PFT performed today.  Pt states she has been doing okay since last visit and states that her breathing is about the same.   Follow-up interstitial lung disease due to autoimmune processes not otherwise specified. -Lupus per [facility] clinic notes in December 2021.   - On CellCept and prednisone since April 2016.  Off prednisone in 2021  Not on Bactrim prophylaxis due to G6PD deficiency.    - Normal Right heart cath Nov 2016 and feb 2019  -  Last high-resolution CT November 2017 and dec 2018 and dec 2020 without progression. - > July 2021 CTA   - Last PFT Sept 2019 and now June 2021 and feb 2022 and sept 2022  HPI TERASA ORSINI 51 y.o. -returns for follow-up.  She continues on her immunosuppressive regimen through [facility] rheumatology program.  She is happy with the care there.  She is not on prednisone.  She uses CPAP through Dr. Val Eagle.  Overall she is feeling stable.  Symptom scores are stable.  She had pulmonary function test and it shows that her FVC is stable but DLCO is declined.  This correlates with a hemoglobin around 10 last done in June 2022 at  [facility].  But then she is also chronically anemic.  She is definitely not noticing any worsening in her shortness of breath.  Her walking desaturation test is also stable.  She wants her flu shot today.        PFT  OV 03/18/2022  Subjective:  Patient ID: Shannon [name], female , DOB: 07/03/1970 , age 89 y.o. , MRN: [id number] , ADDRESS: [address] [place] [address] PCP Eunice Blase, PA-C Patient Care Team: Otila Back as PCP - General (Internal Medicine)  This Provider for this visit: Treatment Team:  Attending Provider: Kalman Shan, MD    03/18/2022 -   Chief Complaint  Patient presents with   Follow-up    Pt states she is better compared to last visit and states the cough is better.   Follow-up interstitial lung disease due to autoimmune processes not otherwise specified. -Lupus per [facility] clinic notes in December 2021.   - On CellCept and prednisone since April 2016.  Off prednisone in 2021  Not on Bactrim prophylaxis due to G6PD deficiency.    - Normal Right heart cath Nov 2016 and feb 2019  -  Last high-resolution CT November 2017 and dec 2018 and dec 2020 without progression. - > July 2021 CTA -. March 2023   - Last PFT Ssept 2022  HPI STEFANI BAIK 51 y.o. -returns for follow-up.  I last saw her 1 year ago.  She continues on CellCept and Plaquenil through [facility].  Shortness of breath is stable.  She had high-resolution CT chest in March 2023 and that is without progression.  She has no other new issues.  She did in the interim tear her rotator cuff on the left side and had surgery.  She is having some sciatic problems.  She will have the flu shot today.  She follows with Dr. Val Eagle for sleep apnea.     SYMPTOM SCALE - ILD 05/13/2019  07/27/2020  03/05/2021  03/18/2022   O2 use RA ra ra ra  Shortness of Breath 0 -> 5 scale with 5 being worst (score 6 If unable to do)     At rest Simple tasks - showers, clothes  change, eating, shaving Household (dishes, doing bed, laundry) Shopping Did not ansewr  Walking level at own pace Walking up Stairs Total (40 - 48) Dyspnea Score How bad is your cough? 0 2 0 0  How bad is your fatigue 3 0 3 2  nausea  0 0 0  vomit  0 0 0  diarrhea  0 0 0  anxiety  depression  4  3          Simple office walk 185 feet x  3 laps goal with forehead probe 03/08/2018  06/03/2018  T.d   O2 used Room air Room iar ra  Number laps completed 3 3 x 250 feet   Comments about pace Slow pace normal nl  Resting Pulse Ox/HR 100% and 96/min 100% and 76 100% and 98/min  Final Pulse Ox/HR 100% and 112/min 96% and 121 97% and 135/min  Desaturated </= 88% n no no  Desaturated <= 3% points no Yes, 4 points Yes, 3  Got Tachycardic >/= 90/min yes yes yes  Symptoms at end of test Mild fatigue Mild dyspnea mild  Miscellaneous comments Very slow pace  Avg pace   CT Chest data HRCT March 2023  IMPRESSION: Basilar and subpleural predominant ground-glass and septal thickening with air trapping, similar to 12/22/2019. Assessment is somewhat limited by respiratory motion and body habitus. Nonspecific interstitial pneumonitis is favored. Findings are indeterminate for UIP per consensus guidelines: Diagnosis of Idiopathic Pulmonary Fibrosis: An Official ATS/ERS/JRS/ALAT Clinical Practice Guideline. Am Rosezetta Schlatter Crit Care Med Vol 198, Iss 5, [phone], Feb 21 2017.     Electronically Signed   By: Leanna Battles M.D.   On: 08/28/2021 11:12    No results found.    PFT  Latest Ref Rng & Units 03/05/2021    2:58 PM 07/27/2020    8:55 AM 11/24/2019   11:04 AM 03/08/2018   10:35 AM 12/01/2017    2:12 PM 06/08/2017    3:13 PM 02/13/2017   10:49 AM  PFT Results  FVC-Pre L 2.64  2.59  2.67  2.46  2.38  2.31  2.32   FVC-Predicted Pre % 73  72  73  67  61  59  59   Pre FEV1/FVC % % 85  88  83  84  83  81  84   FEV1-Pre  L 2.24  2.28  2.21  2.07  1.98  1.87  1.94   FEV1-Predicted Pre % 77  78  75  70  62  59  61   DLCO uncorrected ml/min/mmHg 15.34  19.54  18.56   16.38  14.21  16.35   DLCO UNC% % 60  76  72   47  41  47   DLCO corrected ml/min/mmHg 15.34  19.54  20.32    14.89  15.02   DLCO COR %Predicted % 60  76  79    43  43   DLVA Predicted % 97  117  122   78  78  80        has a past medical history of Anemia, Anxiety, Depression, Dyspnea, G6PD deficiency, GERD (gastroesophageal reflux disease), Hypertension, ILD (interstitial lung disease) (HCC), Lupus (HCC), Pneumonia, Rheumatoid arthritis (HCC), and Sleep apnea.   reports that she quit smoking about 7 years ago. Her smoking use included cigarettes. She has a 7.00 pack-year smoking history. She has never used smokeless tobacco.  Past Surgical History:  Procedure Laterality Date   ABDOMINAL HYSTERECTOMY     APPENDECTOMY  1980   cardiac catherization  08/20/2017   CARDIAC CATHETERIZATION N/A 05/10/2015   Procedure: Right Heart Cath;  Surgeon: Laurey Morale, MD;  Location: [facility] INVASIVE CV LAB;  Service: Cardiovascular;  Laterality: N/A;   CESAREAN SECTION  '95, '02, '07   X 3   CHOLECYSTECTOMY  2010   LAPAROSCOPIC HYSTERECTOMY  10/2015   have ovaries   RIGHT HEART CATH N/A 08/20/2017   Procedure: RIGHT HEART CATH;  Surgeon: Laurey Morale, MD;  Location: [facility] INVASIVE CV LAB;  Service: Cardiovascular;  Laterality: N/A;   SHOULDER ARTHROSCOPY WITH SUBACROMIAL DECOMPRESSION Right 08/28/2021   Procedure: SHOULDER ARTHROSCOPY WITH SUBACROMIAL DECOMPRESSION AND DEBRIDEMENT;  Surgeon: Jene Every, MD;  Location: [facility] ORS;  Service: Orthopedics;  Laterality: Right;   TOTAL KNEE ARTHROPLASTY Left 04/04/2019   Procedure: TOTAL KNEE ARTHROPLASTY;  Surgeon: Ollen Gross, MD;  Location: [facility];  Service: Orthopedics;  Laterality: Left;    TOTAL KNEE ARTHROPLASTY Right 04/30/2020   Procedure: TOTAL KNEE ARTHROPLASTY;  Surgeon: Ollen Gross, MD;   Location: [facility];  Service: Orthopedics;  Laterality: Right;    TUBAL LIGATION      Allergies  Allergen Reactions   Asa [Aspirin] Hives and Shortness Of Breath    Chest tightness    Penicillins Hives    Has patient had a PCN reaction causing immediate rash, facial/tongue/throat swelling, SOB or lightheadedness with hypotension: Unknown Has patient had a PCN reaction causing severe rash involving mucus membranes or skin necrosis: Unknown Has patient had a PCN reaction that required hospitalization: Unknown Has patient had a PCN reaction occurring within the last 10 years: No If all of the above answers are "NO", then may proceed with Cephalosporin use.  Tolerated Cephalosporin Date: 05/01/20.     Sulfa Antibiotics     G6PD deficiency     Immunization History  Administered Date(s) Administered   Influenza Split 03/23/2014   Influenza,inj,Quad PF,6+ Mos 03/15/2016, 06/08/2017, 03/08/2018, 02/17/2019, 03/05/2021   Influenza-Unspecified 04/03/2020   Moderna Sars-Covid-2 Vaccination 09/20/2019, 10/06/2019   PFIZER Comirnaty(Gray Top)Covid-19 Tri-Sucrose Vaccine 09/13/2019, 10/06/2019, 11/05/2019, 05/30/2020   PFIZER(Purple Top)SARS-COV-2 Vaccination 10/06/2019, 11/05/2019   Pfizer Covid-19 Vaccine Bivalent Booster 19yrs & up 04/03/2021   Pneumococcal-Unspecified 08/23/2014    Family History  Problem Relation Age of Onset   Sarcoidosis Mother    Lupus Sister    Healthy Daughter    Healthy Son    Healthy Son      Current Outpatient Medications:    albuterol (VENTOLIN HFA) 108 (90 Base) MCG/ACT inhaler, Inhale 2 puffs into the lungs every 6 (six) hours as needed for wheezing or shortness of breath. (Patient taking differently: Inhale 1 puff into the lungs every 4 (four) hours as needed for wheezing or shortness of breath.), Disp: 1 Inhaler, Rfl: 5   azelastine (ASTELIN) 0.1 % nasal spray, Place 2 sprays into both nostrils 2 (two) times daily. Use in each nostril as directed,  Disp: 30 mL, Rfl: 12   Budeson-Glycopyrrol-Formoterol (BREZTRI AEROSPHERE) 160-9-4.8 MCG/ACT AERO, Inhale 2 puffs into the lungs in the morning and at bedtime., Disp: 10.7 g, Rfl: 0   buPROPion (WELLBUTRIN XL) 150 MG 24 hr tablet, Take 150 mg by mouth daily. , Disp: , Rfl: 1   busPIRone (BUSPAR) 15 MG tablet, Take 15 mg by mouth daily., Disp: , Rfl:    carvedilol (COREG) 6.25 MG tablet, Take 6.25 mg by mouth 2 (two) times daily., Disp: , Rfl:    cetirizine (ZYRTEC ALLERGY) 10 MG tablet, Take 1 tablet (10 mg total) by mouth at bedtime., Disp: 30 tablet, Rfl: 2   fluticasone (FLONASE) 50 MCG/ACT nasal spray, Place 2 sprays into both nostrils daily., Disp: , Rfl:    hydroxychloroquine (PLAQUENIL) 200 MG tablet, Take 400 mg by mouth daily. , Disp: , Rfl:    lisinopril (ZESTRIL) 10 MG tablet, Take 10 mg by mouth daily., Disp: , Rfl:    montelukast (SINGULAIR) 10 MG tablet, Take 1 tablet (10 mg total) by mouth at bedtime., Disp: 30 tablet, Rfl: 2   mycophenolate (CELLCEPT) 500 MG tablet, Take 2 tablets (1,000 mg total) by mouth 2 (two) times daily., Disp: , Rfl:    Oxycodone HCl 10 MG TABS, Take 10 mg by mouth in the morning, at noon, in the evening, and at bedtime., Disp: , Rfl:    QUEtiapine (SEROQUEL) 200 MG tablet, Take 200 mg by mouth at bedtime. , Disp: , Rfl:    sertraline (ZOLOFT) 100 MG tablet, Take 200 mg by mouth at bedtime., Disp: , Rfl: 0      Objective:   Vitals:   03/18/22 1515  BP: 124/82  Pulse: (!) 113  Temp: 98.3 F (36.8 C)  TempSrc: Oral  SpO2: 97%  Weight: (!) 308 lb 12.8 oz (140.1 kg)  Height: 5\' 11"  (1.803 m)    Estimated body mass index is 43.07 kg/m as calculated from the following:   Height as of this encounter: 5\' 11"  (1.803 m).   Weight as of this encounter: 308 lb 12.8 oz (140.1 kg).  @WEIGHTCHANGE @  [employer]   03/18/22 1515  Weight: (!) 308 lb 12.8 oz (140.1 kg)     Physical Examobese. Looks well General: No distress. no Neuro: Alert  and  Oriented x 3. GCS 15. Speech normal Psych: Pleasant Resp:  Barrel Chest - no.  Wheeze - no, Crackles - mild maybe, No overt respiratory distress CVS: Normal heart sounds. Murmurs - no Ext: Stigmata of Connective Tissue Disease - no HEENT: Normal upper airway. PEERL +. No post nasal drip        Assessment:       ICD-10-CM   1. ILD (interstitial lung disease) (HCC)  J84.9     2. Need for immunization against influenza  Z23          Plan:     Patient Instructions  Interstitial lung disease due to connective tissue disease (HCC) High risk medication use Immunosuppressed status  STable CT March 2023 Clinically stable with symptoms Sept 2023t. Marland Kitchen    Plan ' -Continue CellCept through the [facility]ologist at [facility] -Continue Plaquenil through the rheumatologist at [facility] -Noted that you are off prednisone since 2021  -If pulmonary fibrosis gets worse then we can add nintedanib and consider you for research registry study called ILD-Pro -Get spirometry and DLCO in 6 months   History of snoring Witnessed apneic spells OSA    - glad you are seeing Dr Val Eagle and are on CPAP  Plan  - per Dr Val Eagle  Vaccine counseling  PLAN  - flu shot 03/18/2022 - covid mRNA boooster in the fall 2023 - recommend new booster mRNA vaccine   - Please talk to PCP [name], [name], PA-C -  and ensure you get  shingrix (GSK) inactivated vaccine against shingles  Follow-up -Spirometry and DLCO in 6 months -Return to see Dr. Marchelle Gearing 30-minute ILD slot in 6 months  - symptoms score and walk test in 6 months    SIGNATURE    Dr. Kalman Shan, M.D., F.C.C.P,  Pulmonary and Critical Care Medicine Staff Physician, [facility] Director - Interstitial Lung Disease  Program  Pulmonary Fibrosis [facility] at [facility] [place], [place], [address]  Pager: [phone], If no answer or between  15:00h - 7:00h: call [phone] Telephone: [phone]  3:43 PM 03/18/2022

## 2022-03-18 NOTE — Addendum Note (Signed)
Addended by: Lorretta Harp on: 03/18/2022 03:47 PM   Modules accepted: Orders

## 2022-03-19 DIAGNOSIS — M47816 Spondylosis without myelopathy or radiculopathy, lumbar region: Secondary | ICD-10-CM | POA: Insufficient documentation

## 2022-03-21 DIAGNOSIS — M329 Systemic lupus erythematosus, unspecified: Secondary | ICD-10-CM | POA: Diagnosis not present

## 2022-03-24 ENCOUNTER — Ambulatory Visit: Payer: Medicare HMO | Admitting: Pulmonary Disease

## 2022-03-24 DIAGNOSIS — M329 Systemic lupus erythematosus, unspecified: Secondary | ICD-10-CM | POA: Diagnosis not present

## 2022-03-25 DIAGNOSIS — M47816 Spondylosis without myelopathy or radiculopathy, lumbar region: Secondary | ICD-10-CM | POA: Diagnosis not present

## 2022-03-26 DIAGNOSIS — M329 Systemic lupus erythematosus, unspecified: Secondary | ICD-10-CM | POA: Diagnosis not present

## 2022-03-28 DIAGNOSIS — M329 Systemic lupus erythematosus, unspecified: Secondary | ICD-10-CM | POA: Diagnosis not present

## 2022-03-31 DIAGNOSIS — M47816 Spondylosis without myelopathy or radiculopathy, lumbar region: Secondary | ICD-10-CM | POA: Diagnosis not present

## 2022-03-31 DIAGNOSIS — Z96652 Presence of left artificial knee joint: Secondary | ICD-10-CM | POA: Diagnosis not present

## 2022-03-31 DIAGNOSIS — M1711 Unilateral primary osteoarthritis, right knee: Secondary | ICD-10-CM | POA: Diagnosis not present

## 2022-03-31 DIAGNOSIS — M25561 Pain in right knee: Secondary | ICD-10-CM | POA: Diagnosis not present

## 2022-03-31 DIAGNOSIS — E669 Obesity, unspecified: Secondary | ICD-10-CM | POA: Diagnosis not present

## 2022-03-31 DIAGNOSIS — Z79891 Long term (current) use of opiate analgesic: Secondary | ICD-10-CM | POA: Diagnosis not present

## 2022-03-31 DIAGNOSIS — Z96651 Presence of right artificial knee joint: Secondary | ICD-10-CM | POA: Diagnosis not present

## 2022-03-31 DIAGNOSIS — M329 Systemic lupus erythematosus, unspecified: Secondary | ICD-10-CM | POA: Diagnosis not present

## 2022-03-31 DIAGNOSIS — Z1389 Encounter for screening for other disorder: Secondary | ICD-10-CM | POA: Diagnosis not present

## 2022-03-31 DIAGNOSIS — M25512 Pain in left shoulder: Secondary | ICD-10-CM | POA: Diagnosis not present

## 2022-04-02 DIAGNOSIS — M329 Systemic lupus erythematosus, unspecified: Secondary | ICD-10-CM | POA: Diagnosis not present

## 2022-04-07 DIAGNOSIS — M329 Systemic lupus erythematosus, unspecified: Secondary | ICD-10-CM | POA: Diagnosis not present

## 2022-04-08 DIAGNOSIS — M47816 Spondylosis without myelopathy or radiculopathy, lumbar region: Secondary | ICD-10-CM | POA: Diagnosis not present

## 2022-04-09 DIAGNOSIS — M329 Systemic lupus erythematosus, unspecified: Secondary | ICD-10-CM | POA: Diagnosis not present

## 2022-04-10 ENCOUNTER — Telehealth: Payer: Self-pay

## 2022-04-10 NOTE — Telephone Encounter (Signed)
Called pt, didn't receive a answer. Did leave message for pt to bring SD card for CPAP to appt tomorrow with Dr. Si Gaul 04/11/22. If not the SD card then the whole machine is fine.

## 2022-04-11 ENCOUNTER — Ambulatory Visit: Payer: Medicare HMO | Admitting: Pulmonary Disease

## 2022-04-11 DIAGNOSIS — M329 Systemic lupus erythematosus, unspecified: Secondary | ICD-10-CM | POA: Diagnosis not present

## 2022-04-14 DIAGNOSIS — G894 Chronic pain syndrome: Secondary | ICD-10-CM | POA: Diagnosis not present

## 2022-04-14 DIAGNOSIS — M25561 Pain in right knee: Secondary | ICD-10-CM | POA: Diagnosis not present

## 2022-04-14 DIAGNOSIS — M1711 Unilateral primary osteoarthritis, right knee: Secondary | ICD-10-CM | POA: Diagnosis not present

## 2022-04-14 DIAGNOSIS — M329 Systemic lupus erythematosus, unspecified: Secondary | ICD-10-CM | POA: Diagnosis not present

## 2022-04-14 DIAGNOSIS — Z7409 Other reduced mobility: Secondary | ICD-10-CM | POA: Diagnosis not present

## 2022-04-16 DIAGNOSIS — M329 Systemic lupus erythematosus, unspecified: Secondary | ICD-10-CM | POA: Diagnosis not present

## 2022-04-16 NOTE — Telephone Encounter (Signed)
Opened In error

## 2022-04-17 ENCOUNTER — Encounter: Payer: Self-pay | Admitting: Internal Medicine

## 2022-04-17 MED ORDER — BREZTRI AEROSPHERE 160-9-4.8 MCG/ACT IN AERO: 2.0000 | INHALATION_SPRAY | Freq: Two times a day (BID) | RESPIRATORY_TRACT | 0 refills | Status: DC

## 2022-04-17 MED ORDER — MONTELUKAST SODIUM 10 MG PO TABS
10.0000 mg | ORAL_TABLET | Freq: Every day | ORAL | 2 refills | Status: DC
Start: 2022-04-17 — End: 2022-07-15

## 2022-04-17 NOTE — Telephone Encounter (Signed)
Sent in refill for [employer] and Montelukast to [facility].

## 2022-04-18 DIAGNOSIS — F411 Generalized anxiety disorder: Secondary | ICD-10-CM | POA: Diagnosis not present

## 2022-04-18 DIAGNOSIS — R69 Illness, unspecified: Secondary | ICD-10-CM | POA: Diagnosis not present

## 2022-04-18 DIAGNOSIS — M329 Systemic lupus erythematosus, unspecified: Secondary | ICD-10-CM | POA: Diagnosis not present

## 2022-04-18 DIAGNOSIS — F332 Major depressive disorder, recurrent severe without psychotic features: Secondary | ICD-10-CM | POA: Diagnosis not present

## 2022-04-21 DIAGNOSIS — M329 Systemic lupus erythematosus, unspecified: Secondary | ICD-10-CM | POA: Diagnosis not present

## 2022-04-23 DIAGNOSIS — M329 Systemic lupus erythematosus, unspecified: Secondary | ICD-10-CM | POA: Diagnosis not present

## 2022-04-25 DIAGNOSIS — M329 Systemic lupus erythematosus, unspecified: Secondary | ICD-10-CM | POA: Diagnosis not present

## 2022-04-25 DIAGNOSIS — M3210 Systemic lupus erythematosus, organ or system involvement unspecified: Secondary | ICD-10-CM | POA: Diagnosis not present

## 2022-04-25 DIAGNOSIS — Z79899 Other long term (current) drug therapy: Secondary | ICD-10-CM | POA: Diagnosis not present

## 2022-04-28 DIAGNOSIS — M329 Systemic lupus erythematosus, unspecified: Secondary | ICD-10-CM | POA: Diagnosis not present

## 2022-04-29 DIAGNOSIS — Z96651 Presence of right artificial knee joint: Secondary | ICD-10-CM | POA: Diagnosis not present

## 2022-04-29 DIAGNOSIS — M47816 Spondylosis without myelopathy or radiculopathy, lumbar region: Secondary | ICD-10-CM | POA: Diagnosis not present

## 2022-04-29 DIAGNOSIS — Z96652 Presence of left artificial knee joint: Secondary | ICD-10-CM | POA: Diagnosis not present

## 2022-04-29 DIAGNOSIS — Z1389 Encounter for screening for other disorder: Secondary | ICD-10-CM | POA: Diagnosis not present

## 2022-04-29 DIAGNOSIS — Z79891 Long term (current) use of opiate analgesic: Secondary | ICD-10-CM | POA: Diagnosis not present

## 2022-04-29 DIAGNOSIS — M25561 Pain in right knee: Secondary | ICD-10-CM | POA: Diagnosis not present

## 2022-04-29 DIAGNOSIS — E669 Obesity, unspecified: Secondary | ICD-10-CM | POA: Diagnosis not present

## 2022-04-29 DIAGNOSIS — M1711 Unilateral primary osteoarthritis, right knee: Secondary | ICD-10-CM | POA: Diagnosis not present

## 2022-04-30 DIAGNOSIS — M329 Systemic lupus erythematosus, unspecified: Secondary | ICD-10-CM | POA: Diagnosis not present

## 2022-05-02 DIAGNOSIS — M329 Systemic lupus erythematosus, unspecified: Secondary | ICD-10-CM | POA: Diagnosis not present

## 2022-05-02 DIAGNOSIS — Z96651 Presence of right artificial knee joint: Secondary | ICD-10-CM | POA: Diagnosis not present

## 2022-05-02 DIAGNOSIS — M25561 Pain in right knee: Secondary | ICD-10-CM | POA: Diagnosis not present

## 2022-05-03 ENCOUNTER — Ambulatory Visit
Admission: EM | Admit: 2022-05-03 | Discharge: 2022-05-03 | Disposition: A | Discharge status: Home / Self Care | Payer: Medicare HMO | Attending: Urgent Care | Admitting: Urgent Care

## 2022-05-03 ENCOUNTER — Other Ambulatory Visit: Payer: Self-pay

## 2022-05-03 DIAGNOSIS — M069 Rheumatoid arthritis, unspecified: Secondary | ICD-10-CM | POA: Diagnosis not present

## 2022-05-03 DIAGNOSIS — D849 Immunodeficiency, unspecified: Secondary | ICD-10-CM | POA: Diagnosis not present

## 2022-05-03 DIAGNOSIS — Z792 Long term (current) use of antibiotics: Secondary | ICD-10-CM | POA: Diagnosis not present

## 2022-05-03 DIAGNOSIS — Z1152 Encounter for screening for COVID-19: Secondary | ICD-10-CM | POA: Diagnosis not present

## 2022-05-03 DIAGNOSIS — J3489 Other specified disorders of nose and nasal sinuses: Secondary | ICD-10-CM | POA: Insufficient documentation

## 2022-05-03 DIAGNOSIS — R52 Pain, unspecified: Secondary | ICD-10-CM | POA: Diagnosis not present

## 2022-05-03 DIAGNOSIS — M329 Systemic lupus erythematosus, unspecified: Secondary | ICD-10-CM | POA: Insufficient documentation

## 2022-05-03 DIAGNOSIS — J018 Other acute sinusitis: Secondary | ICD-10-CM | POA: Diagnosis not present

## 2022-05-03 LAB — RESP PANEL BY RT-PCR (FLU A&B, COVID) ARPGX2
Influenza A by PCR: NEGATIVE
Influenza B by PCR: NEGATIVE
SARS Coronavirus 2 by RT PCR: NEGATIVE

## 2022-05-03 MED ORDER — DOXYCYCLINE HYCLATE 100 MG PO CAPS: 100.0000 mg | ORAL_CAPSULE | Freq: Two times a day (BID) | ORAL | 0 refills | Status: DC

## 2022-05-03 NOTE — ED Provider Notes (Addendum)
[facility]  Note:  This document was prepared using Conservation officer, historic buildings and may include unintentional dictation errors.  MRN: [id number] DOB: [birth year]  Subjective:   Shannon [name] is a 51 y.o. female presenting for 1 week history of persistent dizziness, sinus pressure, sinus pain, fatigue, body aches, loss of taste and smell, thirst.  Patient is immunocompromise that she has systemic lupus and rheumatoid arthritis.  She is taking her medications consistently.  No chest pain, shortness of breath or wheezing.  No current facility-administered medications for this encounter.  Current Outpatient Medications:    albuterol (VENTOLIN HFA) 108 (90 Base) MCG/ACT inhaler, Inhale 2 puffs into the lungs every 6 (six) hours as needed for wheezing or shortness of breath. (Patient taking differently: Inhale 1 puff into the lungs every 4 (four) hours as needed for wheezing or shortness of breath.), Disp: 1 Inhaler, Rfl: 5   azelastine (ASTELIN) 0.1 % nasal spray, Place 2 sprays into both nostrils 2 (two) times daily. Use in each nostril as directed, Disp: 30 mL, Rfl: 12   Budeson-Glycopyrrol-Formoterol (BREZTRI AEROSPHERE) 160-9-4.8 MCG/ACT AERO, Inhale 2 puffs into the lungs in the morning and at bedtime., Disp: 10.7 g, Rfl: 0   buPROPion (WELLBUTRIN XL) 150 MG 24 hr tablet, Take 150 mg by mouth daily. , Disp: , Rfl: 1   busPIRone (BUSPAR) 15 MG tablet, Take 15 mg by mouth daily., Disp: , Rfl:    carvedilol (COREG) 6.25 MG tablet, Take 6.25 mg by mouth 2 (two) times daily., Disp: , Rfl:    cetirizine (ZYRTEC ALLERGY) 10 MG tablet, Take 1 tablet (10 mg total) by mouth at bedtime., Disp: 30 tablet, Rfl: 2   fluticasone (FLONASE) 50 MCG/ACT nasal spray, Place 2 sprays into both nostrils daily., Disp: , Rfl:    hydroxychloroquine (PLAQUENIL) 200 MG tablet, Take 400 mg by mouth daily. , Disp: , Rfl:    lisinopril (ZESTRIL) 10 MG tablet, Take 10 mg by mouth daily., Disp:  , Rfl:    montelukast (SINGULAIR) 10 MG tablet, Take 1 tablet (10 mg total) by mouth at bedtime., Disp: 30 tablet, Rfl: 2   mycophenolate (CELLCEPT) 500 MG tablet, Take 2 tablets (1,000 mg total) by mouth 2 (two) times daily., Disp: , Rfl:    Oxycodone HCl 10 MG TABS, Take 10 mg by mouth in the morning, at noon, in the evening, and at bedtime., Disp: , Rfl:    QUEtiapine (SEROQUEL) 200 MG tablet, Take 200 mg by mouth at bedtime. , Disp: , Rfl:    sertraline (ZOLOFT) 100 MG tablet, Take 200 mg by mouth at bedtime., Disp: , Rfl: 0   Allergies  Allergen Reactions   Asa [Aspirin] Hives and Shortness Of Breath    Chest tightness    Penicillins Hives    Has patient had a PCN reaction causing immediate rash, facial/tongue/throat swelling, SOB or lightheadedness with hypotension: Unknown Has patient had a PCN reaction causing severe rash involving mucus membranes or skin necrosis: Unknown Has patient had a PCN reaction that required hospitalization: Unknown Has patient had a PCN reaction occurring within the last 10 years: No If all of the above answers are "NO", then may proceed with Cephalosporin use.  Tolerated Cephalosporin Date: 05/01/20.     Sulfa Antibiotics     G6PD deficiency     Past Medical History:  Diagnosis Date   Anemia    Anxiety    Depression    Dyspnea    Sometimes  at rest   G6PD deficiency    GERD (gastroesophageal reflux disease)    Hypertension    ILD (interstitial lung disease) ([place])    Lupus (HCC)    Pneumonia    Rheumatoid arthritis ([place])    Sleep apnea      Past Surgical History:  Procedure Laterality Date   ABDOMINAL HYSTERECTOMY     APPENDECTOMY  1980   cardiac catherization  08/20/2017   CARDIAC CATHETERIZATION N/A 05/10/2015   Procedure: Right Heart Cath;  Surgeon: Larey Dresser, MD;  Location: [facility];  Service: Cardiovascular;  Laterality: N/A;   CESAREAN SECTION  '95, '02, '07   X 3   CHOLECYSTECTOMY  2010   LAPAROSCOPIC  HYSTERECTOMY  10/2015   have ovaries   RIGHT HEART CATH N/A 08/20/2017   Procedure: RIGHT HEART CATH;  Surgeon: Larey Dresser, MD;  Location: [facility];  Service: Cardiovascular;  Laterality: N/A;   SHOULDER ARTHROSCOPY WITH SUBACROMIAL DECOMPRESSION Right 08/28/2021   Procedure: SHOULDER ARTHROSCOPY WITH SUBACROMIAL DECOMPRESSION AND DEBRIDEMENT;  Surgeon: Susa Day, MD;  Location: [facility];  Service: Orthopedics;  Laterality: Right;   TOTAL KNEE ARTHROPLASTY Left 04/04/2019   Procedure: TOTAL KNEE ARTHROPLASTY;  Surgeon: Gaynelle Arabian, MD;  Location: [facility];  Service: Orthopedics;  Laterality: Left;  52min   TOTAL KNEE ARTHROPLASTY Right 04/30/2020   Procedure: TOTAL KNEE ARTHROPLASTY;  Surgeon: Gaynelle Arabian, MD;  Location: [facility];  Service: Orthopedics;  Laterality: Right;  81min   TUBAL LIGATION      Family History  Problem Relation Age of Onset   Sarcoidosis Mother    Lupus Sister    Healthy Daughter    Healthy Son    Healthy Son     Social History   Tobacco Use   Smoking status: Former    Packs/day: 0.50    Years: 14.00    Total pack years: 7.00    Types: Cigarettes    Quit date: 09/25/2014    Years since quitting: 7.6   Smokeless tobacco: Never  Vaping Use   Vaping Use: Never used  Substance Use Topics   Alcohol use: Yes    Alcohol/week: 0.0 standard drinks of alcohol    Comment: occas.   Drug use: No    ROS   Objective:   Vitals: BP (!) 141/89 (BP Location: Left Arm)   Pulse (!) 101   Temp 99.2 F (37.3 C) (Oral)   Resp 20   LMP 04/28/2015   SpO2 97%   Physical Exam Constitutional:      General: She is not in acute distress.    Appearance: Normal appearance. She is well-developed and normal weight. She is not ill-appearing, toxic-appearing or diaphoretic.  HENT:     Head: Normocephalic and atraumatic.     Right Ear: Tympanic membrane, ear canal and external ear normal. No drainage or tenderness. No middle ear effusion. There is no  impacted cerumen. Tympanic membrane is not erythematous or bulging.     Left Ear: Tympanic membrane, ear canal and external ear normal. No drainage or tenderness.  No middle ear effusion. There is no impacted cerumen. Tympanic membrane is not erythematous or bulging.     Nose: Nose normal. No congestion or rhinorrhea.     Mouth/Throat:     Mouth: Mucous membranes are moist. No oral lesions.     Pharynx: No pharyngeal swelling, oropharyngeal exudate, posterior oropharyngeal erythema or uvula swelling.     Tonsils: No tonsillar exudate  or tonsillar abscesses.  Eyes:     General: No scleral icterus.       Right eye: No discharge.        Left eye: No discharge.     Extraocular Movements: Extraocular movements intact.     Right eye: Normal extraocular motion.     Left eye: Normal extraocular motion.     Conjunctiva/sclera: Conjunctivae normal.  Cardiovascular:     Rate and Rhythm: Normal rate and regular rhythm.     Heart sounds: Normal heart sounds. No murmur heard.    No friction rub. No gallop.  Pulmonary:     Effort: Pulmonary effort is normal. No respiratory distress.     Breath sounds: No stridor. No wheezing, rhonchi or rales.  Chest:     Chest wall: No tenderness.  Musculoskeletal:     Cervical back: Normal range of motion and neck supple.  Lymphadenopathy:     Cervical: No cervical adenopathy.  Skin:    General: Skin is warm and dry.  Neurological:     General: No focal deficit present.     Mental Status: She is alert and oriented to person, place, and time.     Cranial Nerves: No cranial nerve deficit.     Motor: No weakness.     Coordination: Coordination normal.     Gait: Gait normal.     Deep Tendon Reflexes: Reflexes normal.  Psychiatric:        Mood and Affect: Mood normal.        Behavior: Behavior normal.       Assessment and Plan :   PDMP not reviewed this encounter.  1. Other acute sinusitis, recurrence not specified   2. Encounter for screening for  COVID-19   3. Sinus pain   4. Body aches   5. Immunocompromised ([place])   6. Rheumatoid arthritis, involving unspecified site, unspecified whether rheumatoid factor present ([place])   7. SLE (systemic lupus erythematosus related syndrome) ([place])     Recommended covering for sinusitis especially since patient is immunocompromised. Deferred imaging given clear cardiopulmonary exam, hemodynamically stable vital signs. Respiratory testing completed as patient is immunocompromised and would like to establish if she has had a potentially exacerbating viral illness such as COVID, flu. Counseled patient on potential for adverse effects with medications prescribed/recommended today, ER and return-to-clinic precautions discussed, patient verbalized understanding.      Jaynee Eagles, [place] 05/12/22 [phone]

## 2022-05-03 NOTE — ED Triage Notes (Signed)
Patient presents to UC for dizziness, fatigue, body aches, loss of taste and smell, and increased thirst x 1 week. Treating with ibuprofen.   Denies fever.

## 2022-05-05 DIAGNOSIS — M329 Systemic lupus erythematosus, unspecified: Secondary | ICD-10-CM | POA: Diagnosis not present

## 2022-05-08 DIAGNOSIS — Z96651 Presence of right artificial knee joint: Secondary | ICD-10-CM | POA: Diagnosis not present

## 2022-05-09 DIAGNOSIS — M329 Systemic lupus erythematosus, unspecified: Secondary | ICD-10-CM | POA: Diagnosis not present

## 2022-05-12 DIAGNOSIS — M329 Systemic lupus erythematosus, unspecified: Secondary | ICD-10-CM | POA: Diagnosis not present

## 2022-05-15 DIAGNOSIS — G894 Chronic pain syndrome: Secondary | ICD-10-CM | POA: Diagnosis not present

## 2022-05-15 DIAGNOSIS — M25561 Pain in right knee: Secondary | ICD-10-CM | POA: Diagnosis not present

## 2022-05-15 DIAGNOSIS — M1711 Unilateral primary osteoarthritis, right knee: Secondary | ICD-10-CM | POA: Diagnosis not present

## 2022-05-20 DIAGNOSIS — M25512 Pain in left shoulder: Secondary | ICD-10-CM | POA: Diagnosis not present

## 2022-05-21 DIAGNOSIS — M329 Systemic lupus erythematosus, unspecified: Secondary | ICD-10-CM | POA: Diagnosis not present

## 2022-05-23 DIAGNOSIS — M329 Systemic lupus erythematosus, unspecified: Secondary | ICD-10-CM | POA: Diagnosis not present

## 2022-05-26 ENCOUNTER — Telehealth: Payer: Self-pay | Admitting: Internal Medicine

## 2022-05-26 DIAGNOSIS — M329 Systemic lupus erythematosus, unspecified: Secondary | ICD-10-CM | POA: Diagnosis not present

## 2022-05-26 NOTE — Telephone Encounter (Signed)
Fax received from Dr. Jene Every with [facility] to perform a left Shoulder Arthroscopy on patient.  Patient needs surgery clearance. Surgery is PENDING. Patient was seen on 03/18/2022. Office protocol is a risk assessment can be sent to surgeon if patient has been seen in 60 days or less.   Sending to Dr Marchelle Gearing for risk assessment or recommendations if patient needs to be seen in office prior to surgical procedure.    Called and left voicemail for patient to call office back in regards to get scheduled for surgical clearance

## 2022-05-27 DIAGNOSIS — J849 Interstitial pulmonary disease, unspecified: Secondary | ICD-10-CM | POA: Diagnosis not present

## 2022-05-27 DIAGNOSIS — I10 Essential (primary) hypertension: Secondary | ICD-10-CM | POA: Diagnosis not present

## 2022-05-27 DIAGNOSIS — Z6841 Body Mass Index (BMI) 40.0 and over, adult: Secondary | ICD-10-CM | POA: Diagnosis not present

## 2022-05-27 DIAGNOSIS — M47816 Spondylosis without myelopathy or radiculopathy, lumbar region: Secondary | ICD-10-CM | POA: Diagnosis not present

## 2022-05-27 DIAGNOSIS — G4730 Sleep apnea, unspecified: Secondary | ICD-10-CM | POA: Diagnosis not present

## 2022-05-27 DIAGNOSIS — I429 Cardiomyopathy, unspecified: Secondary | ICD-10-CM | POA: Diagnosis not present

## 2022-05-27 DIAGNOSIS — Z79891 Long term (current) use of opiate analgesic: Secondary | ICD-10-CM | POA: Diagnosis not present

## 2022-05-27 DIAGNOSIS — Z79899 Other long term (current) drug therapy: Secondary | ICD-10-CM | POA: Diagnosis not present

## 2022-05-27 DIAGNOSIS — Z96651 Presence of right artificial knee joint: Secondary | ICD-10-CM | POA: Diagnosis not present

## 2022-05-27 DIAGNOSIS — R739 Hyperglycemia, unspecified: Secondary | ICD-10-CM | POA: Diagnosis not present

## 2022-05-27 DIAGNOSIS — E669 Obesity, unspecified: Secondary | ICD-10-CM | POA: Diagnosis not present

## 2022-05-27 DIAGNOSIS — M1711 Unilateral primary osteoarthritis, right knee: Secondary | ICD-10-CM | POA: Diagnosis not present

## 2022-05-27 DIAGNOSIS — Z96652 Presence of left artificial knee joint: Secondary | ICD-10-CM | POA: Diagnosis not present

## 2022-05-27 DIAGNOSIS — M25561 Pain in right knee: Secondary | ICD-10-CM | POA: Diagnosis not present

## 2022-05-27 DIAGNOSIS — Z1389 Encounter for screening for other disorder: Secondary | ICD-10-CM | POA: Diagnosis not present

## 2022-05-28 DIAGNOSIS — M329 Systemic lupus erythematosus, unspecified: Secondary | ICD-10-CM | POA: Diagnosis not present

## 2022-05-30 DIAGNOSIS — M329 Systemic lupus erythematosus, unspecified: Secondary | ICD-10-CM | POA: Diagnosis not present

## 2022-06-02 DIAGNOSIS — M329 Systemic lupus erythematosus, unspecified: Secondary | ICD-10-CM | POA: Diagnosis not present

## 2022-06-04 DIAGNOSIS — M329 Systemic lupus erythematosus, unspecified: Secondary | ICD-10-CM | POA: Diagnosis not present

## 2022-06-06 ENCOUNTER — Encounter: Payer: Self-pay | Admitting: Internal Medicine

## 2022-06-06 DIAGNOSIS — M329 Systemic lupus erythematosus, unspecified: Secondary | ICD-10-CM | POA: Diagnosis not present

## 2022-06-09 ENCOUNTER — Other Ambulatory Visit: Payer: Self-pay | Admitting: Nurse Practitioner

## 2022-06-09 DIAGNOSIS — M329 Systemic lupus erythematosus, unspecified: Secondary | ICD-10-CM | POA: Diagnosis not present

## 2022-06-10 ENCOUNTER — Encounter: Payer: Self-pay | Admitting: Nurse Practitioner

## 2022-06-10 ENCOUNTER — Ambulatory Visit (INDEPENDENT_AMBULATORY_CARE_PROVIDER_SITE_OTHER): Payer: Medicare HMO | Admitting: Nurse Practitioner

## 2022-06-10 ENCOUNTER — Ambulatory Visit (INDEPENDENT_AMBULATORY_CARE_PROVIDER_SITE_OTHER): Payer: Medicare HMO

## 2022-06-10 VITALS — BP 136/82 | HR 112 | Ht 71.0 in | Wt 304.4 lb

## 2022-06-10 DIAGNOSIS — J45909 Unspecified asthma, uncomplicated: Secondary | ICD-10-CM | POA: Diagnosis not present

## 2022-06-10 DIAGNOSIS — G4733 Obstructive sleep apnea (adult) (pediatric): Secondary | ICD-10-CM

## 2022-06-10 DIAGNOSIS — J849 Interstitial pulmonary disease, unspecified: Secondary | ICD-10-CM | POA: Diagnosis not present

## 2022-06-10 DIAGNOSIS — J45991 Cough variant asthma: Secondary | ICD-10-CM | POA: Diagnosis not present

## 2022-06-10 DIAGNOSIS — Z01818 Encounter for other preprocedural examination: Secondary | ICD-10-CM

## 2022-06-10 NOTE — Assessment & Plan Note (Signed)
Moderate risk. Factors that increase the risk for postoperative pulmonary complications are CT-ILD, asthma, OSA, obesity.  Respiratory complications generally occur in 1% of ASA Class I patients, 5% of ASA Class II and 10% of ASA Class III-IV patients These complications rarely result in mortality and include postoperative pneumonia, atelectasis, pulmonary embolism, ARDS and increased time requiring postoperative mechanical ventilation.   Overall, I recommend proceeding with the surgery if the risk for respiratory complications are outweighed by the potential benefits. This will need to be discussed between the patient and surgeon.   To reduce risks of respiratory complications, I recommend: --Pre- and post-operative incentive spirometry performed frequently while awake --Inpatient use of currently prescribed positive-pressure for OSA whenever the patient is sleeping --Short duration of surgery as much as possible and avoid paralytic if possible --OOB, encourage mobility post-op, DVT prophylaxis if indicated   1) RISK FOR PROLONGED MECHANICAL VENTILAION - > 48h  1A) [place] - Prolonged mech ventilation risk [place] Postperative Pulmonary Risk Score - for mech ventilation dependence >48h USAA, Ann Surg 2000, major non-cardiac surgery) Comment Score  Type of surgery - abd ao aneurysm (27), thoracic (21), neurosurgery / upper abdominal / vascular (21), neck (11) Left shoulder arthroplasty  5  Emergency Surgery - (11)  0  ALbumin < 3 or poor nutritional state - (9) obesity 6  BUN > 30 -  (8)  0  Partial or completely dependent functional status - (7)  0  COPD -  (6) ILD; asthma 10  Age - 60 to 69 (4), > 70  (6)  0  TOTAL  21  Risk Stratifcation scores  - < 10 (0.5%), 11-19 (1.8%), 20-27 (4.2%), 28-40 (10.1%), >40 (26.6%)  4.2%

## 2022-06-10 NOTE — Assessment & Plan Note (Signed)
CT-ILD without recent progression. Clinically stable. Maintained on CellCept and Plaquenil with [facility]. Plan for repeat spirometry/DLCO in March 2024.   Patient Instructions  Continue Breztri 2 puffs Twice daily. Brush tongue and rinse mouth afterwards  Continue Albuterol inhaler 2 puffs or 3 mL neb every 6 hours as needed for shortness of breath or wheezing. Notify if symptoms persist despite rescue inhaler/neb use.  Continue zyrtec 1 tablet daily for allergies  Continue flonase 2 sprays each nostril daily  Continue singulair (montelukast) 10 mg At bedtime  Continue Astelin nasal spray 2 sprays each nostril Twice daily as needed for nasal congestion/postnasal drainage  Continue pepcid (famotidine) to 20 mg daily as needed for reflux  Take your inhalers with you to surgery. Make sure you use your Breztri the morning of. Out of bed and moving as soon as possible. Use incentive spirometer 10 times an hour, while awake during your recovery period.  Good luck with your surgery and let us know if you need anything!   Follow up in 3 months with Dr. Marchelle Gearing with spirometry/DLCO beforehand. If symptoms do not improve or worsen, please contact office for sooner follow up or seek emergency care.

## 2022-06-10 NOTE — Progress Notes (Signed)
@Patient  ID: , female    DOB: 03/24/1971, 51 y.o.   MRN: [id number]  Chief Complaint  Patient presents with   Follow-up    Pt f/u for surgical clearance, she is having surgery on her left shoulder.     Referring provider: [id number], PA-C  HPI: 51 year old female, former smoker followed for autoimmune ILD, cough variant asthma and OSA on CPAP.  She is followed by Dr. 54 for her ILD and Dr. Marchelle Gearing for sleep.  She was last seen in office 03/18/2022 by Dr. 03/20/2022.  Past medical history significant for rheumatoid arthritis, lupus, seasonal allergies, obesity, depression.  She is on CellCept and hydroxychloroquine for lupus/RA.  TEST/EVENTS:  03/05/2021 PFTs: FVC 73, FEV1 77, ratio 85, DLCO 60 08/27/2021 HRCT chest: Heart is enlarged.  No obvious LAD; there were some small to borderline enlarged subpectoral and axillary lymph nodes which appeared similar and unchanged.  Tiny hiatal hernia was present.  There is mild peripheral and basilar predominant subpleural reticular densities and groundglass.  Assessment for honeycombing limited by respiratory motion and body habitus.  Findings similar and do not appear progressive when compared to 2021.  There was some air trapping noted as well.  NSIP favored. 12/03/2021 CXR 2 view: Borderline to mild cardiomegaly chronic and unchanged.  No acute process noted.  12/03/2021: OV with Groce,NP for follow-up.  She was seen 10/30/2021 for cough and chest discomfort at urgent care.  She was treated with Medrol Dosepak and Decadron injection  She was instructed to complete doxycycline and add Levaquin.  She did get better after this treatment with improvement in cough and secretions cleared up.  She then had resumption of her symptoms, initially with sneezing and then developed a sore throat.  After that she started coughing more again, which was productive with thick yellow secretions.  She is also noticed some increased wheezing.  CXR did not  show any evidence of pneumonia.  She was treated with Depo injection and prednisone taper.  Started on doxycycline course.  Provided with cough control measures.  Continued Breo and Singulair.  She did notice some swelling in her feet and ankles so BMP was ordered which was negative.  Instructed that when she improves she will need 6-minute walk/PFT and HRCT then follow-up with Dr. 12/30/2021.  12/13/2021: OV with Suvi Archuletta NP for intended follow-up.  She reports that she has not had any change in her symptoms since we saw her last. Doesn't feel like the prednisone nor the antibiotics have made a difference. She has also had two steroid shots up to this point. Still having a hacking cough, which is worse when she lays down. It is occasionally productive with green sputum; slight improvement in amount with abx. She does also note that she has postnasal drainage that bothers her, worse when she lays down, and a sore throat from coughing so much. She denies fevers, worsening leg swelling, night sweats, hemoptysis. Shortness of breath is unchanged, worse with coughing spells. She also feels like she wheezes some with coughing spells. She continues on Breo and occasionally uses her rescue. She takes singulair, zyrtec, and flonase for her allergies. She has been using phenergan DM cough syrup with minimal relief.   03/18/2022: OV with Dr. 03/20/2022. Cough is improved. DOE is stable. HRCT from March without progression. Continues on CellCept and Plaquenil through [facility]. Tore her rotator cuff on right and had surgery since she was here last. Given clinical stability, no change to regimen. If  evidence of progression, could consider antifibrotics and research registry. Plan for spirometry and DLCO in 6 months. Follow up with Dr. Wynona Neat for OSA as scheduled.   06/10/2022: Today - surgical clearance Patient presents today for surgical clearance prior to left shoulder arthroscopy with Dr. Jillyn Hidden; surgery date pending. She has been  doing well since she was here last. Breathing is at her baseline. She hasn't had any flares in her cough. She hasn't required steroids or antibiotics. She is using her Breztri twice daily. Rarely requires rescue. She takes zyrtec, singulair and flonase with good control of her allergy symptoms. She tells me she wears CPAP most nights without trouble. No concerns or complaints today.   Allergies  Allergen Reactions   Asa [Aspirin] Hives and Shortness Of Breath    Chest tightness    Penicillins Hives    Has patient had a PCN reaction causing immediate rash, facial/tongue/throat swelling, SOB or lightheadedness with hypotension: Unknown Has patient had a PCN reaction causing severe rash involving mucus membranes or skin necrosis: Unknown Has patient had a PCN reaction that required hospitalization: Unknown Has patient had a PCN reaction occurring within the last 10 years: No If all of the above answers are "NO", then may proceed with Cephalosporin use.  Tolerated Cephalosporin Date: 05/01/20.     Sulfa Antibiotics     G6PD deficiency     Immunization History  Administered Date(s) Administered   Influenza Split 03/23/2014   Influenza,inj,Quad PF,6+ Mos 03/15/2016, 06/08/2017, 03/08/2018, 02/17/2019, 03/05/2021, 03/18/2022   Influenza-Unspecified 04/03/2020   Moderna Sars-Covid-2 Vaccination 09/20/2019, 10/06/2019   PFIZER Comirnaty(Gray Top)Covid-19 Tri-Sucrose Vaccine 09/13/2019, 10/06/2019, 11/05/2019, 05/30/2020   PFIZER(Purple Top)SARS-COV-2 Vaccination 10/06/2019, 11/05/2019   Pfizer Covid-19 Vaccine Bivalent Booster 48yrs & up 04/03/2021   Pneumococcal-Unspecified 08/23/2014    Past Medical History:  Diagnosis Date   Anemia    Anxiety    Depression    Dyspnea    Sometimes at rest   G6PD deficiency    GERD (gastroesophageal reflux disease)    Hypertension    ILD (interstitial lung disease) (HCC)    Lupus (HCC)    Pneumonia    Rheumatoid arthritis (HCC)    Sleep apnea      Tobacco History: Social History   Tobacco Use  Smoking Status Former   Packs/day: 0.50   Years: 14.00   Total pack years: 7.00   Types: Cigarettes   Quit date: 09/25/2014   Years since quitting: 7.7  Smokeless Tobacco Never   Counseling given: Not Answered   Outpatient Medications Prior to Visit  Medication Sig Dispense Refill   albuterol (VENTOLIN HFA) 108 (90 Base) MCG/ACT inhaler Inhale 2 puffs into the lungs every 6 (six) hours as needed for wheezing or shortness of breath. (Patient taking differently: Inhale 1 puff into the lungs every 4 (four) hours as needed for wheezing or shortness of breath.) 1 Inhaler 5   azelastine (ASTELIN) 0.1 % nasal spray Place 2 sprays into both nostrils 2 (two) times daily. Use in each nostril as directed 30 mL 12   Budeson-Glycopyrrol-Formoterol (BREZTRI AEROSPHERE) 160-9-4.8 MCG/ACT AERO Inhale 2 puffs into the lungs in the morning and at bedtime. 10.7 g 0   buPROPion (WELLBUTRIN XL) 150 MG 24 hr tablet Take 150 mg by mouth daily.   1   busPIRone (BUSPAR) 15 MG tablet Take 15 mg by mouth daily.     carvedilol (COREG) 6.25 MG tablet Take 6.25 mg by mouth 2 (two) times daily.  cetirizine (ZYRTEC) 10 MG tablet TAKE 1 TABLET BY MOUTH AT BEDTIME 30 tablet 0   doxycycline (VIBRAMYCIN) 100 MG capsule Take 1 capsule (100 mg total) by mouth 2 (two) times daily. 14 capsule 0   fluticasone (FLONASE) 50 MCG/ACT nasal spray Place 2 sprays into both nostrils daily.     hydroxychloroquine (PLAQUENIL) 200 MG tablet Take 400 mg by mouth daily.      lisinopril (ZESTRIL) 10 MG tablet Take 10 mg by mouth daily.     montelukast (SINGULAIR) 10 MG tablet Take 1 tablet (10 mg total) by mouth at bedtime. 30 tablet 2   mycophenolate (CELLCEPT) 500 MG tablet Take 2 tablets (1,000 mg total) by mouth 2 (two) times daily.     Oxycodone HCl 10 MG TABS Take 10 mg by mouth in the morning, at noon, in the evening, and at bedtime.     QUEtiapine (SEROQUEL) 200 MG tablet Take  200 mg by mouth at bedtime.      sertraline (ZOLOFT) 100 MG tablet Take 200 mg by mouth at bedtime.  0   No facility-administered medications prior to visit.     Review of Systems:   Constitutional: No weight loss or gain, night sweats, fevers, chills, lassitude. +chronic fatigue   HEENT: No headaches, difficulty swallowing, tooth/dental problems, or sore throat. No sneezing, itching, ear ache, nasal congestion, postnasal drip CV:  No chest pain, orthopnea, PND, swelling in lower extremities, anasarca, dizziness, palpitations, syncope Resp: +shortness of breath with exertion (baseline). No cough. No excess mucus or change in color of mucus. No productive or non-productive. No hemoptysis. No wheezing.  No chest wall deformity GI:  No heartburn, indigestion, abdominal pain, loss of appetite Skin: No rash, lesions, ulcerations MSK:  +left shoulder pain. No joint swelling.   Neuro: No dizziness or lightheadedness.  Psych: No depression or anxiety. Mood stable.     Physical Exam:  BP 136/82   Pulse (!) 112   Ht  (1.803 m)   Wt (!) 304 lb 6.4 oz (138.1 kg)   LMP 04/28/2015   SpO2 99%   BMI 42.46 kg/m   GEN: Pleasant, interactive, well-appearing; morbidly obese; in no acute distress. HEENT:  Normocephalic and atraumatic. PERRLA. Sclera white. Nasal turbinates pink, moist and patent bilaterally. No rhinorrhea present. Oropharynx pink and moist, without exudate or edema. No lesions, ulcerations.  NECK:  Supple w/ fair ROM. No JVD present. Normal carotid impulses w/o bruits. Thyroid symmetrical with no goiter or nodules palpated. No lymphadenopathy.   CV: RRR, no m/r/g, no peripheral edema. Pulses intact, +2 bilaterally. No cyanosis, pallor or clubbing. PULMONARY:  Unlabored, regular breathing. Clear bilaterally A&P w/o wheezes/rales/rhonchi. No accessory muscle use. No dullness to percussion. GI: BS present and normoactive. Soft, non-tender to palpation. No organomegaly or masses  detected.  MSK: No erythema, warmth or tenderness. Cap refil <2 sec all extrem. No deformities or joint swelling noted.  Neuro: A/Ox3. No focal deficits noted.   Skin: Warm, no lesions or rashe Psych: Normal affect and behavior. Judgement and thought content appropriate.     Lab Results:  CBC    Component Value Date/Time   WBC 7.7 12/13/2021 1534   RBC 3.97 12/13/2021 1534   HGB 12.0 12/13/2021 1534   HCT 36.4 12/13/2021 1534   PLT 299.0 12/13/2021 1534   MCV 91.6 12/13/2021 1534   MCH 30.6 08/28/2021 0850   MCHC 33.0 12/13/2021 1534   RDW 14.8 12/13/2021 1534   LYMPHSABS 2.3 12/13/2021 1534  MONOABS 0.4 12/13/2021 1534   EOSABS 0.1 12/13/2021 1534   BASOSABS 0.1 12/13/2021 1534    BMET    Component Value Date/Time   NA 137 12/13/2021 1534   K 3.6 12/13/2021 1534   CL 102 12/13/2021 1534   CO2 27 12/13/2021 1534   GLUCOSE 96 12/13/2021 1534   BUN 17 12/13/2021 1534   CREATININE 0.78 12/13/2021 1534   CREATININE 0.78 07/14/2018 1024   CALCIUM 9.5 12/13/2021 1534   GFRNONAA >60 08/15/2021 1031   GFRNONAA 99 02/26/2018 1053   GFRAA >60 02/08/2020 1130   GFRAA 115 02/26/2018 1053    BNP No results found for: "BNP"   Imaging:  No results found.        Latest Ref Rng & Units 03/05/2021    2:58 PM 07/27/2020    8:55 AM 11/24/2019   11:04 AM 03/08/2018   10:35 AM 12/01/2017    2:12 PM 06/08/2017    3:13 PM 02/13/2017   10:49 AM  PFT Results  FVC-Pre L 2.64  2.59  2.67  2.46  2.38  2.31  2.32   FVC-Predicted Pre % 73  72  73  67  61  59  59   Pre FEV1/FVC % % 85  88  83  84  83  81  84   FEV1-Pre L 2.24  2.28  2.21  2.07  1.98  1.87  1.94   FEV1-Predicted Pre % 77  78  75  70  62  59  61   DLCO uncorrected ml/min/mmHg 15.34  19.54  18.56   16.38  14.21  16.35   DLCO UNC% % 60  76  72   47  41  47   DLCO corrected ml/min/mmHg 15.34  19.54  20.32    14.89  15.02   DLCO COR %Predicted % 60  76  79    43  43   DLVA Predicted % 97  117  122   78  78  80      No results found for: "NITRICOXIDE"      Assessment & Plan:   ILD (interstitial lung disease) CT-ILD without recent progression. Clinically stable. Maintained on CellCept and Plaquenil with [facility]. Plan for repeat spirometry/DLCO in March 2024.   Patient Instructions  Continue Breztri 2 puffs Twice daily. Brush tongue and rinse mouth afterwards  Continue Albuterol inhaler 2 puffs or 3 mL neb every 6 hours as needed for shortness of breath or wheezing. Notify if symptoms persist despite rescue inhaler/neb use.  Continue zyrtec 1 tablet daily for allergies  Continue flonase 2 sprays each nostril daily  Continue singulair (montelukast) 10 mg At bedtime  Continue Astelin nasal spray 2 sprays each nostril Twice daily as needed for nasal congestion/postnasal drainage  Continue pepcid (famotidine) to 20 mg daily as needed for reflux  Take your inhalers with you to surgery. Make sure you use your Breztri the morning of. Out of bed and moving as soon as possible. Use incentive spirometer 10 times an hour, while awake during your recovery period.  Good luck with your surgery and let us know if you need anything!   Follow up in 3 months with Dr. Marchelle Gearing with spirometry/DLCO beforehand. If symptoms do not improve or worsen, please contact office for sooner follow up or seek emergency care.    Cough variant asthma  vs uacs  Compensated on current regimen. No flares since June 2023. No changes to current regimen.  Sleep apnea  She reports compliance with use. Airview without data. She is going to see if her machine has SD card and will bring this in. If not, she was instructed to contact DME for troubleshooting. Cautioned on safe driving practices.   Preoperative clearance Moderate risk. Factors that increase the risk for postoperative pulmonary complications are CT-ILD, asthma, OSA, obesity.  Respiratory complications generally occur in 1% of ASA Class I patients, 5% of ASA Class II and  10% of ASA Class III-IV patients These complications rarely result in mortality and include postoperative pneumonia, atelectasis, pulmonary embolism, ARDS and increased time requiring postoperative mechanical ventilation.   Overall, I recommend proceeding with the surgery if the risk for respiratory complications are outweighed by the potential benefits. This will need to be discussed between the patient and surgeon.   To reduce risks of respiratory complications, I recommend: --Pre- and post-operative incentive spirometry performed frequently while awake --Inpatient use of currently prescribed positive-pressure for OSA whenever the patient is sleeping --Short duration of surgery as much as possible and avoid paralytic if possible --OOB, encourage mobility post-op, DVT prophylaxis if indicated   1) RISK FOR PROLONGED MECHANICAL VENTILAION - > 48h  1A) [place] - Prolonged mech ventilation risk [place] Postperative Pulmonary Risk Score - for mech ventilation dependence >48h USAA, Ann Surg 2000, major non-cardiac surgery) Comment Score  Type of surgery - abd ao aneurysm (27), thoracic (21), neurosurgery / upper abdominal / vascular (21), neck (11) Left shoulder arthroplasty  5  Emergency Surgery - (11)  0  ALbumin < 3 or poor nutritional state - (9) obesity 6  BUN > 30 -  (8)  0  Partial or completely dependent functional status - (7)  0  COPD -  (6) ILD; asthma 10  Age - 60 to 59 (4), > 70  (6)  0  TOTAL  21  Risk Stratifcation scores  - < 10 (0.5%), 11-19 (1.8%), 20-27 (4.2%), 28-40 (10.1%), >40 (26.6%)  4.2%     I spent 35 minutes of dedicated to the care of this patient on the date of this encounter to include pre-visit review of records, face-to-face time with the patient discussing conditions above, post visit ordering of testing, clinical documentation with the electronic health record, making appropriate referrals as documented, and communicating necessary findings to  members of the patients care team.  Noemi Chapel, NP 06/10/2022  Pt aware and understands NP's role.

## 2022-06-10 NOTE — Assessment & Plan Note (Signed)
Compensated on current regimen. No flares since June 2023. No changes to current regimen.

## 2022-06-10 NOTE — Assessment & Plan Note (Signed)
She reports compliance with use. Airview without data. She is going to see if her machine has SD card and will bring this in. If not, she was instructed to contact DME for troubleshooting. Cautioned on safe driving practices.

## 2022-06-10 NOTE — Patient Instructions (Addendum)
Continue Breztri 2 puffs Twice daily. Brush tongue and rinse mouth afterwards  Continue Albuterol inhaler 2 puffs or 3 mL neb every 6 hours as needed for shortness of breath or wheezing. Notify if symptoms persist despite rescue inhaler/neb use.  Continue zyrtec 1 tablet daily for allergies  Continue flonase 2 sprays each nostril daily  Continue singulair (montelukast) 10 mg At bedtime  Continue Astelin nasal spray 2 sprays each nostril Twice daily as needed for nasal congestion/postnasal drainage  Continue pepcid (famotidine) to 20 mg daily as needed for reflux  Take your inhalers with you to surgery. Make sure you use your Breztri the morning of. Out of bed and moving as soon as possible. Use incentive spirometer 10 times an hour, while awake during your recovery period.  Good luck with your surgery and let us know if you need anything!   Follow up in 3 months with Dr. Marchelle Gearing with spirometry/DLCO beforehand. If symptoms do not improve or worsen, please contact office for sooner follow up or seek emergency care.

## 2022-06-10 NOTE — Telephone Encounter (Signed)
OV notes and clearance form have been faxed back to [facility]. Nothing further needed at this time. ?

## 2022-06-11 DIAGNOSIS — M329 Systemic lupus erythematosus, unspecified: Secondary | ICD-10-CM | POA: Diagnosis not present

## 2022-06-13 DIAGNOSIS — J849 Interstitial pulmonary disease, unspecified: Secondary | ICD-10-CM | POA: Diagnosis not present

## 2022-06-14 DIAGNOSIS — M25561 Pain in right knee: Secondary | ICD-10-CM | POA: Diagnosis not present

## 2022-06-14 DIAGNOSIS — M1711 Unilateral primary osteoarthritis, right knee: Secondary | ICD-10-CM | POA: Diagnosis not present

## 2022-06-14 DIAGNOSIS — G894 Chronic pain syndrome: Secondary | ICD-10-CM | POA: Diagnosis not present

## 2022-06-18 DIAGNOSIS — J849 Interstitial pulmonary disease, unspecified: Secondary | ICD-10-CM | POA: Diagnosis not present

## 2022-06-20 DIAGNOSIS — J849 Interstitial pulmonary disease, unspecified: Secondary | ICD-10-CM | POA: Diagnosis not present

## 2022-06-23 DIAGNOSIS — J849 Interstitial pulmonary disease, unspecified: Secondary | ICD-10-CM | POA: Diagnosis not present

## 2022-06-24 DIAGNOSIS — M79662 Pain in left lower leg: Secondary | ICD-10-CM | POA: Diagnosis not present

## 2022-06-25 DIAGNOSIS — J849 Interstitial pulmonary disease, unspecified: Secondary | ICD-10-CM | POA: Diagnosis not present

## 2022-06-27 DIAGNOSIS — J849 Interstitial pulmonary disease, unspecified: Secondary | ICD-10-CM | POA: Diagnosis not present

## 2022-06-30 DIAGNOSIS — M25561 Pain in right knee: Secondary | ICD-10-CM | POA: Diagnosis not present

## 2022-06-30 DIAGNOSIS — M47816 Spondylosis without myelopathy or radiculopathy, lumbar region: Secondary | ICD-10-CM | POA: Diagnosis not present

## 2022-06-30 DIAGNOSIS — Z1389 Encounter for screening for other disorder: Secondary | ICD-10-CM | POA: Diagnosis not present

## 2022-06-30 DIAGNOSIS — J849 Interstitial pulmonary disease, unspecified: Secondary | ICD-10-CM | POA: Diagnosis not present

## 2022-06-30 DIAGNOSIS — E669 Obesity, unspecified: Secondary | ICD-10-CM | POA: Diagnosis not present

## 2022-06-30 DIAGNOSIS — M1711 Unilateral primary osteoarthritis, right knee: Secondary | ICD-10-CM | POA: Diagnosis not present

## 2022-06-30 DIAGNOSIS — Z96652 Presence of left artificial knee joint: Secondary | ICD-10-CM | POA: Diagnosis not present

## 2022-06-30 DIAGNOSIS — Z96651 Presence of right artificial knee joint: Secondary | ICD-10-CM | POA: Diagnosis not present

## 2022-06-30 DIAGNOSIS — Z79891 Long term (current) use of opiate analgesic: Secondary | ICD-10-CM | POA: Diagnosis not present

## 2022-07-01 DIAGNOSIS — Z131 Encounter for screening for diabetes mellitus: Secondary | ICD-10-CM | POA: Diagnosis not present

## 2022-07-01 DIAGNOSIS — E559 Vitamin D deficiency, unspecified: Secondary | ICD-10-CM | POA: Diagnosis not present

## 2022-07-01 DIAGNOSIS — Z13220 Encounter for screening for lipoid disorders: Secondary | ICD-10-CM | POA: Diagnosis not present

## 2022-07-01 DIAGNOSIS — N951 Menopausal and female climacteric states: Secondary | ICD-10-CM | POA: Diagnosis not present

## 2022-07-01 DIAGNOSIS — Z1329 Encounter for screening for other suspected endocrine disorder: Secondary | ICD-10-CM | POA: Diagnosis not present

## 2022-07-01 DIAGNOSIS — Z130 Encounter for screening for diseases of the blood and blood-forming organs and certain disorders involving the immune mechanism: Secondary | ICD-10-CM | POA: Diagnosis not present

## 2022-07-02 DIAGNOSIS — J849 Interstitial pulmonary disease, unspecified: Secondary | ICD-10-CM | POA: Diagnosis not present

## 2022-07-04 DIAGNOSIS — J849 Interstitial pulmonary disease, unspecified: Secondary | ICD-10-CM | POA: Diagnosis not present

## 2022-07-07 DIAGNOSIS — J849 Interstitial pulmonary disease, unspecified: Secondary | ICD-10-CM | POA: Diagnosis not present

## 2022-07-08 DIAGNOSIS — J984 Other disorders of lung: Secondary | ICD-10-CM | POA: Diagnosis not present

## 2022-07-08 DIAGNOSIS — N951 Menopausal and female climacteric states: Secondary | ICD-10-CM | POA: Diagnosis not present

## 2022-07-08 DIAGNOSIS — R69 Illness, unspecified: Secondary | ICD-10-CM | POA: Diagnosis not present

## 2022-07-08 DIAGNOSIS — Z1331 Encounter for screening for depression: Secondary | ICD-10-CM | POA: Diagnosis not present

## 2022-07-08 DIAGNOSIS — Z1339 Encounter for screening examination for other mental health and behavioral disorders: Secondary | ICD-10-CM | POA: Diagnosis not present

## 2022-07-08 DIAGNOSIS — E559 Vitamin D deficiency, unspecified: Secondary | ICD-10-CM | POA: Diagnosis not present

## 2022-07-08 DIAGNOSIS — D508 Other iron deficiency anemias: Secondary | ICD-10-CM | POA: Diagnosis not present

## 2022-07-08 DIAGNOSIS — M329 Systemic lupus erythematosus, unspecified: Secondary | ICD-10-CM | POA: Diagnosis not present

## 2022-07-09 DIAGNOSIS — M79642 Pain in left hand: Secondary | ICD-10-CM | POA: Diagnosis not present

## 2022-07-09 DIAGNOSIS — R936 Abnormal findings on diagnostic imaging of limbs: Secondary | ICD-10-CM | POA: Diagnosis not present

## 2022-07-09 DIAGNOSIS — M25571 Pain in right ankle and joints of right foot: Secondary | ICD-10-CM | POA: Diagnosis not present

## 2022-07-09 DIAGNOSIS — R413 Other amnesia: Secondary | ICD-10-CM | POA: Diagnosis not present

## 2022-07-09 DIAGNOSIS — M79644 Pain in right finger(s): Secondary | ICD-10-CM | POA: Diagnosis not present

## 2022-07-09 DIAGNOSIS — F419 Anxiety disorder, unspecified: Secondary | ICD-10-CM | POA: Diagnosis not present

## 2022-07-09 DIAGNOSIS — Z79899 Other long term (current) drug therapy: Secondary | ICD-10-CM | POA: Diagnosis not present

## 2022-07-09 DIAGNOSIS — M3213 Lung involvement in systemic lupus erythematosus: Secondary | ICD-10-CM | POA: Diagnosis not present

## 2022-07-09 DIAGNOSIS — J849 Interstitial pulmonary disease, unspecified: Secondary | ICD-10-CM | POA: Diagnosis not present

## 2022-07-09 DIAGNOSIS — M79641 Pain in right hand: Secondary | ICD-10-CM | POA: Diagnosis not present

## 2022-07-09 DIAGNOSIS — G8929 Other chronic pain: Secondary | ICD-10-CM | POA: Diagnosis not present

## 2022-07-09 DIAGNOSIS — M79645 Pain in left finger(s): Secondary | ICD-10-CM | POA: Diagnosis not present

## 2022-07-09 DIAGNOSIS — M329 Systemic lupus erythematosus, unspecified: Secondary | ICD-10-CM | POA: Diagnosis not present

## 2022-07-09 DIAGNOSIS — M25572 Pain in left ankle and joints of left foot: Secondary | ICD-10-CM | POA: Diagnosis not present

## 2022-07-09 DIAGNOSIS — M1000 Idiopathic gout, unspecified site: Secondary | ICD-10-CM | POA: Diagnosis not present

## 2022-07-09 DIAGNOSIS — R69 Illness, unspecified: Secondary | ICD-10-CM | POA: Diagnosis not present

## 2022-07-11 DIAGNOSIS — J849 Interstitial pulmonary disease, unspecified: Secondary | ICD-10-CM | POA: Diagnosis not present

## 2022-07-13 ENCOUNTER — Other Ambulatory Visit: Payer: Self-pay | Admitting: Nurse Practitioner

## 2022-07-13 ENCOUNTER — Other Ambulatory Visit: Payer: Self-pay | Admitting: Internal Medicine

## 2022-07-14 DIAGNOSIS — F332 Major depressive disorder, recurrent severe without psychotic features: Secondary | ICD-10-CM | POA: Diagnosis not present

## 2022-07-14 DIAGNOSIS — R69 Illness, unspecified: Secondary | ICD-10-CM | POA: Diagnosis not present

## 2022-07-14 DIAGNOSIS — J849 Interstitial pulmonary disease, unspecified: Secondary | ICD-10-CM | POA: Diagnosis not present

## 2022-07-14 DIAGNOSIS — F411 Generalized anxiety disorder: Secondary | ICD-10-CM | POA: Diagnosis not present

## 2022-07-15 DIAGNOSIS — Z6841 Body Mass Index (BMI) 40.0 and over, adult: Secondary | ICD-10-CM | POA: Diagnosis not present

## 2022-07-15 DIAGNOSIS — M25561 Pain in right knee: Secondary | ICD-10-CM | POA: Diagnosis not present

## 2022-07-15 DIAGNOSIS — G894 Chronic pain syndrome: Secondary | ICD-10-CM | POA: Diagnosis not present

## 2022-07-15 DIAGNOSIS — D508 Other iron deficiency anemias: Secondary | ICD-10-CM | POA: Diagnosis not present

## 2022-07-15 DIAGNOSIS — M1711 Unilateral primary osteoarthritis, right knee: Secondary | ICD-10-CM | POA: Diagnosis not present

## 2022-07-16 DIAGNOSIS — J849 Interstitial pulmonary disease, unspecified: Secondary | ICD-10-CM | POA: Diagnosis not present

## 2022-07-18 DIAGNOSIS — M79605 Pain in left leg: Secondary | ICD-10-CM | POA: Diagnosis not present

## 2022-07-18 DIAGNOSIS — J849 Interstitial pulmonary disease, unspecified: Secondary | ICD-10-CM | POA: Diagnosis not present

## 2022-07-18 DIAGNOSIS — M5416 Radiculopathy, lumbar region: Secondary | ICD-10-CM | POA: Diagnosis not present

## 2022-07-21 ENCOUNTER — Ambulatory Visit: Payer: Self-pay | Admitting: Orthopedic Surgery

## 2022-07-21 DIAGNOSIS — J849 Interstitial pulmonary disease, unspecified: Secondary | ICD-10-CM | POA: Diagnosis not present

## 2022-07-23 DIAGNOSIS — J849 Interstitial pulmonary disease, unspecified: Secondary | ICD-10-CM | POA: Diagnosis not present

## 2022-07-24 ENCOUNTER — Ambulatory Visit: Payer: Self-pay | Admitting: Orthopedic Surgery

## 2022-07-24 DIAGNOSIS — Z6841 Body Mass Index (BMI) 40.0 and over, adult: Secondary | ICD-10-CM | POA: Diagnosis not present

## 2022-07-24 DIAGNOSIS — M329 Systemic lupus erythematosus, unspecified: Secondary | ICD-10-CM | POA: Diagnosis not present

## 2022-07-24 NOTE — H&P (Addendum)
Shannon [name] is an 52 y.o. female.   Chief Complaint: left shoulder pain HPI: Visit For: Follow up Hand Dominance: left Location: left; shoulder Severity: pain level 5/10 Alleviating Factors: Ibuprofen and Oxycodone Aggravating Factors: Pain when lying on her left side and pain with elevation Previous Injections: Several injections helped for about a week  Past Medical History:  Diagnosis Date   Anemia    Anxiety    Depression    Dyspnea    Sometimes at rest   G6PD deficiency    GERD (gastroesophageal reflux disease)    Hypertension    ILD (interstitial lung disease) ([place])    Lupus (HCC)    Pneumonia    Rheumatoid arthritis ([place])    Sleep apnea     Past Surgical History:  Procedure Laterality Date   ABDOMINAL HYSTERECTOMY     APPENDECTOMY  1980   cardiac catherization  08/20/2017   CARDIAC CATHETERIZATION N/A 05/10/2015   Procedure: Right Heart Cath;  Surgeon: Larey Dresser, MD;  Location: [facility];  Service: Cardiovascular;  Laterality: N/A;   CESAREAN SECTION  '95, '02, '07   X 3   CHOLECYSTECTOMY  2010   LAPAROSCOPIC HYSTERECTOMY  10/2015   have ovaries   RIGHT HEART CATH N/A 08/20/2017   Procedure: RIGHT HEART CATH;  Surgeon: Larey Dresser, MD;  Location: [facility];  Service: Cardiovascular;  Laterality: N/A;   SHOULDER ARTHROSCOPY WITH SUBACROMIAL DECOMPRESSION Right 08/28/2021   Procedure: SHOULDER ARTHROSCOPY WITH SUBACROMIAL DECOMPRESSION AND DEBRIDEMENT;  Surgeon: Susa Day, MD;  Location: [facility];  Service: Orthopedics;  Laterality: Right;   TOTAL KNEE ARTHROPLASTY Left 04/04/2019   Procedure: TOTAL KNEE ARTHROPLASTY;  Surgeon: Gaynelle Arabian, MD;  Location: [facility];  Service: Orthopedics;  Laterality: Left;  63mn   TOTAL KNEE ARTHROPLASTY Right 04/30/2020   Procedure: TOTAL KNEE ARTHROPLASTY;  Surgeon: AGaynelle Arabian MD;  Location: [facility];  Service: Orthopedics;  Laterality: Right;  521m   TUBAL LIGATION      Family History   Problem Relation Age of Onset   Sarcoidosis Mother    Lupus Sister    Healthy Daughter    Healthy Son    Healthy Son    Social History:  reports that she quit smoking about 7 years ago. Her smoking use included cigarettes. She has a 7.00 pack-year smoking history. She has never used smokeless tobacco. She reports current alcohol use. She reports that she does not use drugs.  Allergies:  Allergies  Allergen Reactions   Asa [Aspirin] Hives and Shortness Of Breath    Chest tightness    Penicillins Hives    Has patient had a PCN reaction causing immediate rash, facial/tongue/throat swelling, SOB or lightheadedness with hypotension: Unknown Has patient had a PCN reaction causing severe rash involving mucus membranes or skin necrosis: Unknown Has patient had a PCN reaction that required hospitalization: Unknown Has patient had a PCN reaction occurring within the last 10 years: No If all of the above answers are "NO", then may proceed with Cephalosporin use.  Tolerated Cephalosporin Date: 05/01/20.     Sulfa Antibiotics     G6PD deficiency    Current meds: albuterol sulfate HFA 90 mcg/actuation aerosol inhaler buPROPion HCL XL 300 mg 24 hr tablet, extended release busPIRone 15 mg tablet carvediloL 6.25 mg tablet cetirizine 10 mg tablet fluticasone propionate 50 mcg/actuation nasal spray,suspension hydroxychloroquine 200 mg tablet lisinopriL 10 mg tablet magnesium oxide 400 mg (241.3 mg magnesium) tablet montelukast 10  mg tablet mycophenolate mofetiL 500 mg tablet Myrbetriq 25 mg tablet,extended release oxyCODONE 10 mg tablet promethazine-DM 6.25 mg-15 mg/5 mL oral syrup QUEtiapine ER 400 mg tablet,extended release 24 hr sertraline 100 mg tablet  Review of Systems  Constitutional: Negative.   HENT: Negative.    Eyes: Negative.   Respiratory: Negative.    Cardiovascular: Negative.   Gastrointestinal: Negative.   Endocrine: Negative.   Genitourinary: Negative.    Musculoskeletal:  Positive for arthralgias.  Skin: Negative.   Allergic/Immunologic: Negative.   Neurological: Negative.   Hematological: Negative.   Psychiatric/Behavioral: Negative.      Last menstrual period 04/28/2015. Physical Exam Constitutional:      Appearance: Normal appearance.  HENT:     Head: Normocephalic and atraumatic.     Right Ear: External ear normal.     Left Ear: External ear normal.     Nose: Nose normal.     Mouth/Throat:     Pharynx: Oropharynx is clear.  Eyes:     Conjunctiva/sclera: Conjunctivae normal.  Cardiovascular:     Rate and Rhythm: Normal rate and regular rhythm.     Pulses: Normal pulses.     Heart sounds: Normal heart sounds.  Pulmonary:     Effort: Pulmonary effort is normal.     Breath sounds: Normal breath sounds.  Abdominal:     General: Bowel sounds are normal.  Musculoskeletal:     Cervical back: Normal range of motion and neck supple.     Comments: Constitutional: General Appearance: healthy-appearing and NAD.  Psychiatric: Mood and Affect: normal mood and affect.  Cardiovascular System: Arterial Pulses Left: radial normal and brachial normal. Edema Left: none. Varicosities Right: no varicosities. Varicosities Left: no varicosities.  C-Spine/Neck: Active Range of Motion: flexion normal, extension normal, and no pain elicited on motion.  Shoulders: Inspection Left: no misalignment, atrophy, erythema, swelling, or scapular winging. Bony Palpation Left: no tenderness of the sternoclavicular joint, the coracoid process, the acromioclavicular joint, the bicipital groove, or the scapula. Soft Tissue Palpation Left: no tenderness of the infraspinatus, the teres minor, the subacromial bursa, the axilla, the glenohumeral joint region, the pectoralis major insertion, the sternocleidomastoid, the costochondral junction, the trapezius, the rhomboid, the latissimus dorsi, the serratus, the deltoid, the levator scapulae, or the lateral cuff  insertion and tenderness of the supraspinatus and the subdeltoid bursa. Active Range of Motion Left: limited. Special Tests Left: Speed's test negative and Neer's test positive. Stability Left: no laxity, sulcus sign negative, and anterior apprehension test negative. Strength Left: abduction 5/5, adduction 5/5, flexion 5/5, and extension 5/5.  Skin: Left Upper Extremity: normal.  Neurological System: Biceps Reflex Left: normal (2). Brachioradialis Reflex Left: normal (2). Triceps Reflex Left: normal (2). Sensation on the Left: C5 normal, C6 normal, and C7 normal.  Neurological:     Mental Status: She is alert.    MRI of the shoulder demonstrates a 40% bursal sided tear of the infraspinatus and a 40 to 50% articular side tear of the supraspinatus. Rotator cuff arthropathy bursitis and tendinitis.  Assessment/Plan Impression:  1. 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  I had an extensive discussion with the patient concerning her pathology and relevant anatomy. To the  persistence of their symptoms despite conservative treatment to include an exercise program, activity modification and injections we discussed proceeding with a shoulder arthroscopy. Discussed the procedure in detail including the risks and benefits of improvement in symptoms, worsening of their symptoms, no changes in her symptoms. I discussed decompressing the subacromial space by a bursectomy and CA ligament release as well as acromioplasty if needed. Additionally discussed evaluating the labrum and biceps if appropriate. In addition I discussed the possibility that if an occult tear of the rotator cuff is noted  that it may require a mini open rotator cuff repair. In addition discussed the outpatient procedure. Follow-up in 2 weeks. Pendulum exercises and appropriate postoperative analgesics.  No history of DVT PE or MRSA.  Patient is unable to take aspirin due to severe hives. Nor penicillin. Again severe hives.  We will utilize vancomycin and gentamicin.  Postoperative DVT prophylaxis would be teds and possible Lovenox or Xarelto \ Preoperative clearance she is getting a bone scan just to rule out any abnormal activity in her knee as she has elevated inflammatory parameters although she has an underlying lupus disorder  Plan L SA, SAD, Debridement, possible mini-open RCR  Cecilie Kicks, PA-C for Dr Tonita Cong 07/24/2022, 8:18 AM

## 2022-07-24 NOTE — H&P (View-Only) (Signed)
Shannon [name] is an 52 y.o. female.   Chief Complaint: left shoulder pain HPI: Visit For: Follow up Hand Dominance: left Location: left; shoulder Severity: pain level 5/10 Alleviating Factors: Ibuprofen and Oxycodone Aggravating Factors: Pain when lying on her left side and pain with elevation Previous Injections: Several injections helped for about a week  Past Medical History:  Diagnosis Date   Anemia    Anxiety    Depression    Dyspnea    Sometimes at rest   G6PD deficiency    GERD (gastroesophageal reflux disease)    Hypertension    ILD (interstitial lung disease) ([place])    Lupus (HCC)    Pneumonia    Rheumatoid arthritis ([place])    Sleep apnea     Past Surgical History:  Procedure Laterality Date   ABDOMINAL HYSTERECTOMY     APPENDECTOMY  1980   cardiac catherization  08/20/2017   CARDIAC CATHETERIZATION N/A 05/10/2015   Procedure: Right Heart Cath;  Surgeon: Larey Dresser, MD;  Location: [facility];  Service: Cardiovascular;  Laterality: N/A;   CESAREAN SECTION  '95, '02, '07   X 3   CHOLECYSTECTOMY  2010   LAPAROSCOPIC HYSTERECTOMY  10/2015   have ovaries   RIGHT HEART CATH N/A 08/20/2017   Procedure: RIGHT HEART CATH;  Surgeon: Larey Dresser, MD;  Location: [facility];  Service: Cardiovascular;  Laterality: N/A;   SHOULDER ARTHROSCOPY WITH SUBACROMIAL DECOMPRESSION Right 08/28/2021   Procedure: SHOULDER ARTHROSCOPY WITH SUBACROMIAL DECOMPRESSION AND DEBRIDEMENT;  Surgeon: Susa Day, MD;  Location: [facility];  Service: Orthopedics;  Laterality: Right;   TOTAL KNEE ARTHROPLASTY Left 04/04/2019   Procedure: TOTAL KNEE ARTHROPLASTY;  Surgeon: Gaynelle Arabian, MD;  Location: [facility];  Service: Orthopedics;  Laterality: Left;  32mn   TOTAL KNEE ARTHROPLASTY Right 04/30/2020   Procedure: TOTAL KNEE ARTHROPLASTY;  Surgeon: AGaynelle Arabian MD;  Location: [facility];  Service: Orthopedics;  Laterality: Right;  549m   TUBAL LIGATION      Family History   Problem Relation Age of Onset   Sarcoidosis Mother    Lupus Sister    Healthy Daughter    Healthy Son    Healthy Son    Social History:  reports that she quit smoking about 7 years ago. Her smoking use included cigarettes. She has a 7.00 pack-year smoking history. She has never used smokeless tobacco. She reports current alcohol use. She reports that she does not use drugs.  Allergies:  Allergies  Allergen Reactions   Asa [Aspirin] Hives and Shortness Of Breath    Chest tightness    Penicillins Hives    Has patient had a PCN reaction causing immediate rash, facial/tongue/throat swelling, SOB or lightheadedness with hypotension: Unknown Has patient had a PCN reaction causing severe rash involving mucus membranes or skin necrosis: Unknown Has patient had a PCN reaction that required hospitalization: Unknown Has patient had a PCN reaction occurring within the last 10 years: No If all of the above answers are "NO", then may proceed with Cephalosporin use.  Tolerated Cephalosporin Date: 05/01/20.     Sulfa Antibiotics     G6PD deficiency    Current meds: albuterol sulfate HFA 90 mcg/actuation aerosol inhaler buPROPion HCL XL 300 mg 24 hr tablet, extended release busPIRone 15 mg tablet carvediloL 6.25 mg tablet cetirizine 10 mg tablet fluticasone propionate 50 mcg/actuation nasal spray,suspension hydroxychloroquine 200 mg tablet lisinopriL 10 mg tablet magnesium oxide 400 mg (241.3 mg magnesium) tablet montelukast 10  mg tablet mycophenolate mofetiL 500 mg tablet Myrbetriq 25 mg tablet,extended release oxyCODONE 10 mg tablet promethazine-DM 6.25 mg-15 mg/5 mL oral syrup QUEtiapine ER 400 mg tablet,extended release 24 hr sertraline 100 mg tablet  Review of Systems  Constitutional: Negative.   HENT: Negative.    Eyes: Negative.   Respiratory: Negative.    Cardiovascular: Negative.   Gastrointestinal: Negative.   Endocrine: Negative.   Genitourinary: Negative.    Musculoskeletal:  Positive for arthralgias.  Skin: Negative.   Allergic/Immunologic: Negative.   Neurological: Negative.   Hematological: Negative.   Psychiatric/Behavioral: Negative.      Last menstrual period 04/28/2015. Physical Exam Constitutional:      Appearance: Normal appearance.  HENT:     Head: Normocephalic and atraumatic.     Right Ear: External ear normal.     Left Ear: External ear normal.     Nose: Nose normal.     Mouth/Throat:     Pharynx: Oropharynx is clear.  Eyes:     Conjunctiva/sclera: Conjunctivae normal.  Cardiovascular:     Rate and Rhythm: Normal rate and regular rhythm.     Pulses: Normal pulses.     Heart sounds: Normal heart sounds.  Pulmonary:     Effort: Pulmonary effort is normal.     Breath sounds: Normal breath sounds.  Abdominal:     General: Bowel sounds are normal.  Musculoskeletal:     Cervical back: Normal range of motion and neck supple.     Comments: Constitutional: General Appearance: healthy-appearing and NAD.  Psychiatric: Mood and Affect: normal mood and affect.  Cardiovascular System: Arterial Pulses Left: radial normal and brachial normal. Edema Left: none. Varicosities Right: no varicosities. Varicosities Left: no varicosities.  C-Spine/Neck: Active Range of Motion: flexion normal, extension normal, and no pain elicited on motion.  Shoulders: Inspection Left: no misalignment, atrophy, erythema, swelling, or scapular winging. Bony Palpation Left: no tenderness of the sternoclavicular joint, the coracoid process, the acromioclavicular joint, the bicipital groove, or the scapula. Soft Tissue Palpation Left: no tenderness of the infraspinatus, the teres minor, the subacromial bursa, the axilla, the glenohumeral joint region, the pectoralis major insertion, the sternocleidomastoid, the costochondral junction, the trapezius, the rhomboid, the latissimus dorsi, the serratus, the deltoid, the levator scapulae, or the lateral cuff  insertion and tenderness of the supraspinatus and the subdeltoid bursa. Active Range of Motion Left: limited. Special Tests Left: Speed's test negative and Neer's test positive. Stability Left: no laxity, sulcus sign negative, and anterior apprehension test negative. Strength Left: abduction 5/5, adduction 5/5, flexion 5/5, and extension 5/5.  Skin: Left Upper Extremity: normal.  Neurological System: Biceps Reflex Left: normal (2). Brachioradialis Reflex Left: normal (2). Triceps Reflex Left: normal (2). Sensation on the Left: C5 normal, C6 normal, and C7 normal.  Neurological:     Mental Status: She is alert.    MRI of the shoulder demonstrates a 40% bursal sided tear of the infraspinatus and a 40 to 50% articular side tear of the supraspinatus. Rotator cuff arthropathy bursitis and tendinitis.  Assessment/Plan Impression:  1. 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  I had an extensive discussion with the patient concerning her pathology and relevant anatomy. To the  persistence of their symptoms despite conservative treatment to include an exercise program, activity modification and injections we discussed proceeding with a shoulder arthroscopy. Discussed the procedure in detail including the risks and benefits of improvement in symptoms, worsening of their symptoms, no changes in her symptoms. I discussed decompressing the subacromial space by a bursectomy and CA ligament release as well as acromioplasty if needed. Additionally discussed evaluating the labrum and biceps if appropriate. In addition I discussed the possibility that if an occult tear of the rotator cuff is noted  that it may require a mini open rotator cuff repair. In addition discussed the outpatient procedure. Follow-up in 2 weeks. Pendulum exercises and appropriate postoperative analgesics.  No history of DVT PE or MRSA.  Patient is unable to take aspirin due to severe hives. Nor penicillin. Again severe hives.  We will utilize vancomycin and gentamicin.  Postoperative DVT prophylaxis would be teds and possible Lovenox or Xarelto \ Preoperative clearance she is getting a bone scan just to rule out any abnormal activity in her knee as she has elevated inflammatory parameters although she has an underlying lupus disorder  Plan L SA, SAD, Debridement, possible mini-open RCR  Cecilie Kicks, PA-C for Dr Tonita Cong 07/24/2022, 8:18 AM

## 2022-07-29 DIAGNOSIS — M47816 Spondylosis without myelopathy or radiculopathy, lumbar region: Secondary | ICD-10-CM | POA: Diagnosis not present

## 2022-07-30 NOTE — Progress Notes (Signed)
The patient was identified using 2 approved identifiers. All issues noted in this document were discussed and addressed, Ms Cahoon  voiced understanding and agreement with all preoperative instructions. The patient was given her surgery instructions, Ensure and CHG soap this morning when she came in for her labs and EKG. She had 2 small children with her and could not have an in-person PST interview.   Patient called our Pharmacy and reconciled her medications. Mrs Towe voiced understanding of which medications she can take on DOS.     COVID Vaccine received:  []  No [x]  Yes Date of any COVID positive Test in last 90 days:  PCP - Janine Limbo, PA-C Cardiologist - Doristine Mango, PA  Loralie Champagne, MD Pulmonology- Brand Males, MD    Marland Kitchen, NP- pulmonary clearance 06-10-22 Epic note Rheumatology- [facility]  [phone]  Chest x-ray -  EKG - 08-05-2017  epic    will repeat at PST  Stress Test -  ECHO - 07-15-2017  epic Cardiac Cath - [facility] x 2 by Loralie Champagne, MD epic (08-20-2017  & 05-10-2015)  PCR screen: []  Ordered & Completed                      []   No Order but Needs PROFEND                      [x]   N/A for this surgery  Surgery Plan:  [x]  Ambulatory                            []  Outpatient in bed                            []  Admit  Anesthesia:    []  General  []  Spinal                           [x]   Choice []   MAC  Pacemaker / ICD device []  No [x]  Yes        Device order form faxed [x]  No    []   Yes      Faxed to:  Spinal Cord Stimulator:[x]  No []  Yes      (Remind patient to bring remote DOS) Other Implants:   History of Sleep Apnea? []  No [x]  Yes   CPAP used?- []  No [x]  Yes    Does the patient monitor blood sugar? []  No []  Yes  [x]  N/A  Blood Thinner / Instructions:  none Aspirin Instructions:  none  ERAS Protocol Ordered: []  No  [x]  Yes PRE-SURGERY [x]  ENSURE  []  G2  Patient is to be NPO after: 05:30 am  Comments:   Activity level:  Patient can not climb a flight of stairs without difficulty d/t her Lupus and RA.,    Patient can perform ADLs without assistance.   Anesthesia review: HTN, OSA (CPAP), ILD, Lupus, Anemia, RA  Patient denies shortness of breath, fever, cough and chest pain at PAT appointment.  Patient verbalized understanding and agreement to the Pre-Surgical Instructions that were given to them at this PAT appointment. Patient was also educated of the need to review these PAT instructions again prior to his/her surgery.I reviewed the appropriate phone numbers to call if they have any and questions or concerns.

## 2022-07-30 NOTE — Patient Instructions (Signed)
SURGICAL WAITING ROOM VISITATION Patients having surgery or a procedure may have no more than 2 support people in the waiting area - these visitors may rotate in the visitor waiting room.   Due to an increase in RSV and influenza rates and associated hospitalizations, children ages 17 and under may not visit patients in [place]. If the patient needs to stay at the hospital during part of their recovery, the visitor guidelines for inpatient rooms apply.  PRE-OP VISITATION  Pre-op nurse will coordinate an appropriate time for 1 support person to accompany the patient in pre-op.  This support person may not rotate.  This visitor will be contacted when the time is appropriate for the visitor to come back in the pre-op area.  Please refer to the [facility] website for the visitor guidelines for Inpatients (after your surgery is over and you are in a regular room).  You are not required to quarantine at this time prior to your surgery. However, you must do this: Hand Hygiene often Do NOT share personal items Notify your provider if you are in close contact with someone who has COVID or you develop fever 100.4 or greater, new onset of sneezing, cough, sore throat, shortness of breath or body aches.  If you test positive for Covid or have been in contact with anyone that has tested positive in the last 10 days please notify you surgeon.   CALL our Pharmacy [phone] to reconcile your medications.     Your procedure is scheduled on:  Wednesday  August 13, 2022  Report to [facility] Main Entrance: [address] entrance where the [employer] is available.   Report to admitting at: 06:15 AM  +++++Call this number if you have any questions or problems the morning of surgery [phone]  Do not eat food after Midnight the night prior to your surgery/procedure.  After Midnight you may have the following liquids until  05:30 AM DAY OF SURGERY  Clear Liquid  Diet Water Black Coffee (sugar ok, NO MILK/CREAM OR CREAMERS)  Tea (sugar ok, NO MILK/CREAM OR CREAMERS) regular and decaf                             Plain Jell-O  with no fruit (NO RED)                                           Fruit ices (not with fruit pulp, NO RED)                                     Popsicles (NO RED)                                                                  Juice: apple, WHITE grape, WHITE cranberry Sports drinks like Gatorade or Powerade (NO RED)                   The day of surgery:  Drink ONE (1) Pre-Surgery Clear Ensure at 05:30 AM the morning  of surgery. Drink in one sitting. Do not sip.  This drink was given to you during your  pre-op appointment visit. Nothing else to drink after completing the Pre-Surgery Clear Ensure : No candy, chewing gum or throat lozenges.    FOLLOW ANY ADDITIONAL PRE OP INSTRUCTIONS YOU RECEIVED FROM YOUR SURGEON'S OFFICE!!!   Oral Hygiene is also important to reduce your risk of infection.        Remember - BRUSH YOUR TEETH THE MORNING OF SURGERY WITH YOUR REGULAR TOOTHPASTE  Do NOT smoke after Midnight the night before surgery.  Take ONLY these medicines the morning of surgery with A SIP OF WATER: Bretzi inhaler. I will let you know the medications to take when I call you for your Phone interview.    If You have been diagnosed with Sleep Apnea - Bring CPAP mask and tubing day of surgery. We will provide you with a CPAP machine on the day of your surgery.                   You may not have any metal on your body including hair pins, jewelry, and body piercing  Do not wear make-up, lotions, powders, perfumes or deodorant  Do not wear nail polish including gel and S&S, artificial / acrylic nails, or any other type of covering on natural nails including finger and toenails. If you have artificial nails, gel coating, etc., that needs to be removed by a nail salon, Please have this removed prior to surgery. Not doing so may  mean that your surgery could be cancelled or delayed if the Surgeon or anesthesia staff feels like they are unable to monitor you safely.   Do not shave 48 hours prior to surgery to avoid nicks in your skin which may contribute to postoperative infections.   Contacts, Hearing Aids, dentures or bridgework may not be worn into surgery. DENTURES WILL BE REMOVED PRIOR TO SURGERY PLEASE DO NOT APPLY "Poly grip" OR ADHESIVES!!!  Patients discharged on the day of surgery will not be allowed to drive home.  Someone NEEDS to stay with you for the first 24 hours after anesthesia.  Do not bring your home medications to the hospital. The Pharmacy will dispense medications listed on your medication list to you during your admission in the Hospital.  Special Instructions: Bring a copy of your healthcare power of attorney and living will documents the day of surgery, if you wish to have them scanned into your  Medical Records- EPIC  Please read over the following fact sheets you were given: IF YOU HAVE QUESTIONS ABOUT YOUR PRE-OP INSTRUCTIONS, PLEASE CALL [phone]  ([place])   [place] - Preparing for Surgery Before surgery, you can play an important role.  Because skin is not sterile, your skin needs to be as free of germs as possible.  You can reduce the number of germs on your skin by washing with CHG (chlorahexidine gluconate) soap before surgery.  CHG is an antiseptic cleaner which kills germs and bonds with the skin to continue killing germs even after washing. Please DO NOT use if you have an allergy to CHG or antibacterial soaps.  If your skin becomes reddened/irritated stop using the CHG and inform your nurse when you arrive at Short Stay. Do not shave (including legs and underarms) for at least 48 hours prior to the first CHG shower.  You may shave your face/neck.  Please follow these instructions carefully:  1.  Shower with CHG Soap the  night before surgery and the  morning of  surgery.  2.  If you choose to wash your hair, wash your hair first as usual with your normal  shampoo.  3.  After you shampoo, rinse your hair and body thoroughly to remove the shampoo.                             4.  Use CHG as you would any other liquid soap.  You can apply chg directly to the skin and wash.  Gently with a scrungie or clean washcloth.  5.  Apply the CHG Soap to your body ONLY FROM THE NECK DOWN.   Do not use on face/ open                           Wound or open sores. Avoid contact with eyes, ears mouth and genitals (private parts).                       Wash face,  Genitals (private parts) with your normal soap.             6.  Wash thoroughly, paying special attention to the area where your  surgery  will be performed.  7.  Thoroughly rinse your body with warm water from the neck down.  8.  DO NOT shower/wash with your normal soap after using and rinsing off the CHG Soap.            9.  Pat yourself dry with a clean towel.            10.  Wear clean pajamas.            11.  Place clean sheets on your bed the night of your first shower and do not  sleep with pets.  ON THE DAY OF SURGERY : Do not apply any lotions/deodorants the morning of surgery.  Please wear clean clothes to the hospital/surgery center.    FAILURE TO FOLLOW THESE INSTRUCTIONS MAY RESULT IN THE CANCELLATION OF YOUR SURGERY  PATIENT SIGNATURE_________________________________  NURSE SIGNATURE__________________________________  ________________________________________________________________________        Shannon [name]    An incentive spirometer is a tool that can help keep your lungs clear and active. This tool measures how well you are filling your lungs with each breath. Taking long deep breaths may help reverse or decrease the chance of developing breathing (pulmonary) problems (especially infection) following: A long period of time when you are unable to move or be active. BEFORE  THE PROCEDURE  If the spirometer includes an indicator to show your best effort, your nurse or respiratory therapist will set it to a desired goal. If possible, sit up straight or lean slightly forward. Try not to slouch. Hold the incentive spirometer in an upright position. INSTRUCTIONS FOR USE  Sit on the edge of your bed if possible, or sit up as far as you can in bed or on a chair. Hold the incentive spirometer in an upright position. Breathe out normally. Place the mouthpiece in your mouth and seal your lips tightly around it. Breathe in slowly and as deeply as possible, raising the piston or the ball toward the top of the column. Hold your breath for 3-5 seconds or for as long as possible. Allow the piston or ball to fall to the bottom of the column. Remove the mouthpiece  from your mouth and breathe out normally. Rest for a few seconds and repeat Steps 1 through 7 at least 10 times every 1-2 hours when you are awake. Take your time and take a few normal breaths between deep breaths. The spirometer may include an indicator to show your best effort. Use the indicator as a goal to work toward during each repetition. After each set of 10 deep breaths, practice coughing to be sure your lungs are clear. If you have an incision (the cut made at the time of surgery), support your incision when coughing by placing a pillow or rolled up towels firmly against it. Once you are able to get out of bed, walk around indoors and cough well. You may stop using the incentive spirometer when instructed by your caregiver.  RISKS AND COMPLICATIONS Take your time so you do not get dizzy or light-headed. If you are in pain, you may need to take or ask for pain medication before doing incentive spirometry. It is harder to take a deep breath if you are having pain. AFTER USE Rest and breathe slowly and easily. It can be helpful to keep track of a log of your progress. Your caregiver can provide you with a simple  table to help with this. If you are using the spirometer at home, follow these instructions: SEEK MEDICAL CARE IF:  You are having difficultly using the spirometer. You have trouble using the spirometer as often as instructed. Your pain medication is not giving enough relief while using the spirometer. You develop fever of 100.5 F (38.1 C) or higher.                                                                                                    SEEK IMMEDIATE MEDICAL CARE IF:  You cough up bloody sputum that had not been present before. You develop fever of 102 F (38.9 C) or greater. You develop worsening pain at or near the incision site. MAKE SURE YOU:  Understand these instructions. Will watch your condition. Will get help right away if you are not doing well or get worse. Document Released: 10/20/2006 Document Revised: 09/01/2011 Document Reviewed: 12/21/2006 [facility] Patient Information 2014 [place], [place].

## 2022-07-31 ENCOUNTER — Other Ambulatory Visit: Payer: Self-pay

## 2022-07-31 ENCOUNTER — Encounter (HOSPITAL_COMMUNITY)
Admission: RE | Admit: 2022-07-31 | Discharge: 2022-07-31 | Disposition: A | Discharge status: Home / Self Care | Payer: Medicare HMO | Source: Ambulatory Visit | Attending: Specialist | Admitting: Specialist

## 2022-07-31 ENCOUNTER — Encounter (HOSPITAL_COMMUNITY): Payer: Self-pay

## 2022-07-31 DIAGNOSIS — M069 Rheumatoid arthritis, unspecified: Secondary | ICD-10-CM | POA: Insufficient documentation

## 2022-07-31 DIAGNOSIS — Z01818 Encounter for other preprocedural examination: Secondary | ICD-10-CM | POA: Insufficient documentation

## 2022-07-31 DIAGNOSIS — D75A Glucose-6-phosphate dehydrogenase (G6PD) deficiency without anemia: Secondary | ICD-10-CM | POA: Diagnosis not present

## 2022-07-31 DIAGNOSIS — J849 Interstitial pulmonary disease, unspecified: Secondary | ICD-10-CM | POA: Insufficient documentation

## 2022-07-31 DIAGNOSIS — M7542 Impingement syndrome of left shoulder: Secondary | ICD-10-CM | POA: Diagnosis not present

## 2022-07-31 DIAGNOSIS — I10 Essential (primary) hypertension: Secondary | ICD-10-CM | POA: Insufficient documentation

## 2022-07-31 DIAGNOSIS — G4730 Sleep apnea, unspecified: Secondary | ICD-10-CM | POA: Diagnosis not present

## 2022-07-31 DIAGNOSIS — Z87891 Personal history of nicotine dependence: Secondary | ICD-10-CM | POA: Insufficient documentation

## 2022-07-31 LAB — BASIC METABOLIC PANEL
Anion gap: 9 (ref 5–15)
BUN: 17 mg/dL (ref 6–20)
CO2: 24 mmol/L (ref 22–32)
Calcium: 8.7 mg/dL — ABNORMAL LOW (ref 8.9–10.3)
Chloride: 105 mmol/L (ref 98–111)
Creatinine, Ser: 0.92 mg/dL (ref 0.44–1.00)
GFR, Estimated: >60 mL/min (ref >60)
Glucose, Bld: 106 mg/dL — ABNORMAL HIGH (ref 70–99)
Potassium: 3.4 mmol/L — ABNORMAL LOW (ref 3.5–5.1)
Sodium: 138 mmol/L (ref 135–145)

## 2022-07-31 LAB — CBC
HCT: 34.7 % — ABNORMAL LOW (ref 36.0–46.0)
Hemoglobin: 11 g/dL — ABNORMAL LOW (ref 12.0–15.0)
MCH: 29.7 pg (ref 26.0–34.0)
MCHC: 31.7 g/dL (ref 30.0–36.0)
MCV: 93.8 fL (ref 80.0–100.0)
Platelets: 219 10*3/uL (ref 150–400)
RBC: 3.7 MIL/uL — ABNORMAL LOW (ref 3.87–5.11)
RDW: 13.6 % (ref 11.5–15.5)
WBC: 4.6 10*3/uL (ref 4.0–10.5)
nRBC: 0 % (ref 0.0–0.2)

## 2022-08-01 DIAGNOSIS — Z6841 Body Mass Index (BMI) 40.0 and over, adult: Secondary | ICD-10-CM | POA: Diagnosis not present

## 2022-08-01 DIAGNOSIS — M329 Systemic lupus erythematosus, unspecified: Secondary | ICD-10-CM | POA: Diagnosis not present

## 2022-08-01 DIAGNOSIS — E559 Vitamin D deficiency, unspecified: Secondary | ICD-10-CM | POA: Diagnosis not present

## 2022-08-01 NOTE — Anesthesia Preprocedure Evaluation (Addendum)
Anesthesia Evaluation  Patient identified by MRN, date of birth, ID band Patient awake    Reviewed: Allergy & Precautions, H&P , NPO status , Patient's Chart, lab work & pertinent test results  Airway Mallampati: III  TM Distance: >3 FB Neck ROM: Full    Dental no notable dental hx. (+) Edentulous Upper, Edentulous Lower, Dental Advisory Given   Pulmonary shortness of breath, asthma , sleep apnea , former smoker   Pulmonary exam normal breath sounds clear to auscultation       Cardiovascular Exercise Tolerance: Good hypertension, Pt. on medications and Pt. on home beta blockers  Rhythm:Regular Rate:Normal     Neuro/Psych   Anxiety Depression    negative neurological ROS     GI/Hepatic Neg liver ROS,GERD  Medicated,,  Endo/Other  negative endocrine ROS    Renal/GU negative Renal ROS  negative genitourinary   Musculoskeletal  (+) Arthritis , Osteoarthritis,    Abdominal   Peds  Hematology  (+) Blood dyscrasia, anemia   Anesthesia Other Findings   Reproductive/Obstetrics negative OB ROS                             Anesthesia Physical Anesthesia Plan  ASA: 3  Anesthesia Plan: General and Regional   Post-op Pain Management: Tylenol PO (pre-op)*   Induction: Intravenous  PONV Risk Score and Plan: 3 and Ondansetron, Dexamethasone and Midazolam  Airway Management Planned: Oral ETT  Additional Equipment:   Intra-op Plan:   Post-operative Plan: Extubation in OR  Informed Consent: I have reviewed the patients History and Physical, chart, labs and discussed the procedure including the risks, benefits and alternatives for the proposed anesthesia with the patient or authorized representative who has indicated his/her understanding and acceptance.     Dental advisory given  Plan Discussed with: CRNA  Anesthesia Plan Comments: (See PAT note 07/31/2022)       Anesthesia Quick  Evaluation

## 2022-08-01 NOTE — Progress Notes (Signed)
Anesthesia Chart Review   Case: [id number] Date/Time: 08/13/22 0815   Procedure: SHOULDER ARTHROSCOPY WITH SUBACROMIAL DECOMPRESSION, DEBRIDEMENT AND PARTIAL ROTATOR CUFF REPAIR (Left)   Anesthesia type: Choice   Pre-op diagnosis: Impingement syndrome left shoulder   Location: [facility] ROOM 06 / [facility]   Surgeons: Susa Day, MD       DISCUSSION:52 y.o. former smoker with h/o RA, HTN, Lupus, ILD, G6PD deficiency, sleep apnea, left shoulder impingement syndrome scheduled for above procedure 08/13/22 with Dr. Susa Day.   Pt last seen by pulmonology 06/10/2022. ILD clinically stable at this time. Per OV note, "Moderate risk. Factors that increase the risk for postoperative pulmonary complications are CT-ILD, asthma, OSA, obesity.   Respiratory complications generally occur in 1% of ASA Class I patients, 5% of ASA Class II and 10% of ASA Class III-IV patients These complications rarely result in mortality and include postoperative pneumonia, atelectasis, pulmonary embolism, ARDS and increased time requiring postoperative mechanical ventilation.   Overall, I recommend proceeding with the surgery if the risk for respiratory complications are outweighed by the potential benefits. This will need to be discussed between the patient and surgeon.   To reduce risks of respiratory complications, I recommend: --Pre- and post-operative incentive spirometry performed frequently while awake --Inpatient use of currently prescribed positive-pressure for OSA whenever the patient is sleeping --Short duration of surgery as much as possible and avoid paralytic if possible --OOB, encourage mobility post-op, DVT prophylaxis if indicated"  Anticipate pt can proceed with planned procedure barring acute status change.   VS: LMP 04/28/2015   PROVIDERS: Janine Limbo, PA-C is PCP   Brand Males, MD is Pulmonologist  LABS: Labs reviewed: Acceptable for surgery. (all labs ordered are listed, but only  abnormal results are displayed)  Labs Reviewed  BASIC METABOLIC PANEL - Abnormal; Notable for the following components:      Result Value   Potassium 3.4 (*)    Glucose, Bld 106 (*)    Calcium 8.7 (*)    All other components within normal limits  CBC - Abnormal; Notable for the following components:   RBC 3.70 (*)    Hemoglobin 11.0 (*)    HCT 34.7 (*)    All other components within normal limits     IMAGES:   EKG:   CV: Echo 07/15/2017 - Left ventricle: The cavity size was normal. Wall thickness was    normal. Systolic function was normal. The estimated ejection    fraction was in the range of 55% to 60%. Wall motion was normal;    there were no regional wall motion abnormalities. Features are    consistent with a pseudonormal left ventricular filling pattern,    with concomitant abnormal relaxation and increased filling    pressure (grade 2 diastolic dysfunction).  - Mitral valve: There was mild regurgitation.  - Left atrium: The atrium was mildly dilated.  - Right atrium: The atrium was mildly dilated.  - Pulmonary arteries: Systolic pressure was mildly increased. PA    peak pressure: 35 mm Hg (S).  Past Medical History:  Diagnosis Date   Anemia    Anxiety    Depression    Dyspnea    Sometimes at rest   G6PD deficiency    GERD (gastroesophageal reflux disease)    Hypertension    ILD (interstitial lung disease) (HCC)    Lupus (HCC)    Pneumonia    Rheumatoid arthritis ([place])    Sleep apnea     Past Surgical History:  Procedure Laterality Date   ABDOMINAL HYSTERECTOMY     APPENDECTOMY  1980   cardiac catherization  08/20/2017   CARDIAC CATHETERIZATION N/A 05/10/2015   Procedure: Right Heart Cath;  Surgeon: Larey Dresser, MD;  Location: [facility];  Service: Cardiovascular;  Laterality: N/A;   CESAREAN SECTION  '95, '02, '07   X 3   CHOLECYSTECTOMY  2010   LAPAROSCOPIC HYSTERECTOMY  10/2015   have ovaries   RIGHT HEART CATH N/A 08/20/2017    Procedure: RIGHT HEART CATH;  Surgeon: Larey Dresser, MD;  Location: [facility];  Service: Cardiovascular;  Laterality: N/A;   SHOULDER ARTHROSCOPY WITH SUBACROMIAL DECOMPRESSION Right 08/28/2021   Procedure: SHOULDER ARTHROSCOPY WITH SUBACROMIAL DECOMPRESSION AND DEBRIDEMENT;  Surgeon: Susa Day, MD;  Location[facility];  Service: Orthopedics;  Laterality: Right;   TOTAL KNEE ARTHROPLASTY Left 04/04/2019   Procedure: TOTAL KNEE ARTHROPLASTY;  Surgeon: Gaynelle Arabian, MD;  Location: [facility];  Service: Orthopedics;  Laterality: Left;  63mn   TOTAL KNEE ARTHROPLASTY Right 04/30/2020   Procedure: TOTAL KNEE ARTHROPLASTY;  Surgeon: AGaynelle Arabian MD;  Location[facility];  Service: Orthopedics;  Laterality: Right;  580m   TUBAL LIGATION      MEDICATIONS:  albuterol (VENTOLIN HFA) 108 (90 Base) MCG/ACT inhaler   amitriptyline (ELAVIL) 25 MG tablet   Budeson-Glycopyrrol-Formoterol (BREZTRI AEROSPHERE) 160-9-4.8 MCG/ACT AERO   buPROPion (WELLBUTRIN XL) 300 MG 24 hr tablet   busPIRone (BUSPAR) 15 MG tablet   carvedilol (COREG) 6.25 MG tablet   cetirizine (ZYRTEC) 10 MG tablet   doxycycline (VIBRAMYCIN) 100 MG capsule   fluticasone (FLONASE) 50 MCG/ACT nasal spray   hydroxychloroquine (PLAQUENIL) 200 MG tablet   ibuprofen (ADVIL) 200 MG tablet   lisinopril (ZESTRIL) 10 MG tablet   Magnesium 250 MG TABS   montelukast (SINGULAIR) 10 MG tablet   mycophenolate (CELLCEPT) 500 MG tablet   Oxycodone HCl 10 MG TABS   QUEtiapine (SEROQUEL XR) 300 MG 24 hr tablet   sertraline (ZOLOFT) 100 MG tablet   No current facility-administered medications for this encounter.     JeKonrad Felixard, PA-C [facility] [phone]

## 2022-08-04 DIAGNOSIS — E669 Obesity, unspecified: Secondary | ICD-10-CM | POA: Diagnosis not present

## 2022-08-04 DIAGNOSIS — M75102 Unspecified rotator cuff tear or rupture of left shoulder, not specified as traumatic: Secondary | ICD-10-CM | POA: Diagnosis not present

## 2022-08-04 DIAGNOSIS — M25561 Pain in right knee: Secondary | ICD-10-CM | POA: Diagnosis not present

## 2022-08-04 DIAGNOSIS — Z96652 Presence of left artificial knee joint: Secondary | ICD-10-CM | POA: Diagnosis not present

## 2022-08-04 DIAGNOSIS — Z96651 Presence of right artificial knee joint: Secondary | ICD-10-CM | POA: Diagnosis not present

## 2022-08-04 DIAGNOSIS — M1711 Unilateral primary osteoarthritis, right knee: Secondary | ICD-10-CM | POA: Diagnosis not present

## 2022-08-04 DIAGNOSIS — Z79891 Long term (current) use of opiate analgesic: Secondary | ICD-10-CM | POA: Diagnosis not present

## 2022-08-04 DIAGNOSIS — Z1389 Encounter for screening for other disorder: Secondary | ICD-10-CM | POA: Diagnosis not present

## 2022-08-04 DIAGNOSIS — M47816 Spondylosis without myelopathy or radiculopathy, lumbar region: Secondary | ICD-10-CM | POA: Diagnosis not present

## 2022-08-05 DIAGNOSIS — M79605 Pain in left leg: Secondary | ICD-10-CM | POA: Diagnosis not present

## 2022-08-05 DIAGNOSIS — M5416 Radiculopathy, lumbar region: Secondary | ICD-10-CM | POA: Diagnosis not present

## 2022-08-09 ENCOUNTER — Other Ambulatory Visit: Payer: Self-pay | Admitting: Nurse Practitioner

## 2022-08-11 DIAGNOSIS — D508 Other iron deficiency anemias: Secondary | ICD-10-CM | POA: Diagnosis not present

## 2022-08-11 DIAGNOSIS — Z6841 Body Mass Index (BMI) 40.0 and over, adult: Secondary | ICD-10-CM | POA: Diagnosis not present

## 2022-08-11 DIAGNOSIS — E559 Vitamin D deficiency, unspecified: Secondary | ICD-10-CM | POA: Diagnosis not present

## 2022-08-12 ENCOUNTER — Encounter (HOSPITAL_COMMUNITY): Payer: Self-pay | Admitting: Specialist

## 2022-08-13 ENCOUNTER — Encounter (HOSPITAL_COMMUNITY)
Admission: RE | Disposition: A | Discharge status: Home / Self Care | Payer: Self-pay | Source: Ambulatory Visit | Attending: Specialist

## 2022-08-13 ENCOUNTER — Ambulatory Visit (HOSPITAL_COMMUNITY)
Admission: RE | Admit: 2022-08-13 | Discharge: 2022-08-13 | Disposition: A | Discharge status: Home / Self Care | Payer: Medicare HMO | Source: Ambulatory Visit | Attending: Specialist | Admitting: Specialist

## 2022-08-13 ENCOUNTER — Ambulatory Visit (HOSPITAL_BASED_OUTPATIENT_CLINIC_OR_DEPARTMENT_OTHER): Payer: Medicare HMO | Admitting: Anesthesiology

## 2022-08-13 ENCOUNTER — Ambulatory Visit (HOSPITAL_COMMUNITY): Payer: Medicare HMO | Admitting: Physician Assistant

## 2022-08-13 ENCOUNTER — Other Ambulatory Visit: Payer: Self-pay

## 2022-08-13 ENCOUNTER — Encounter (HOSPITAL_COMMUNITY): Payer: Self-pay | Admitting: Specialist

## 2022-08-13 DIAGNOSIS — I10 Essential (primary) hypertension: Secondary | ICD-10-CM

## 2022-08-13 DIAGNOSIS — M75112 Incomplete rotator cuff tear or rupture of left shoulder, not specified as traumatic: Secondary | ICD-10-CM | POA: Insufficient documentation

## 2022-08-13 DIAGNOSIS — Z87891 Personal history of nicotine dependence: Secondary | ICD-10-CM | POA: Insufficient documentation

## 2022-08-13 DIAGNOSIS — J45909 Unspecified asthma, uncomplicated: Secondary | ICD-10-CM

## 2022-08-13 DIAGNOSIS — G4730 Sleep apnea, unspecified: Secondary | ICD-10-CM | POA: Insufficient documentation

## 2022-08-13 DIAGNOSIS — S46012A Strain of muscle(s) and tendon(s) of the rotator cuff of left shoulder, initial encounter: Secondary | ICD-10-CM | POA: Diagnosis not present

## 2022-08-13 DIAGNOSIS — F32A Depression, unspecified: Secondary | ICD-10-CM | POA: Insufficient documentation

## 2022-08-13 DIAGNOSIS — Z6841 Body Mass Index (BMI) 40.0 and over, adult: Secondary | ICD-10-CM | POA: Insufficient documentation

## 2022-08-13 DIAGNOSIS — F419 Anxiety disorder, unspecified: Secondary | ICD-10-CM | POA: Diagnosis not present

## 2022-08-13 DIAGNOSIS — G8918 Other acute postprocedural pain: Secondary | ICD-10-CM | POA: Diagnosis not present

## 2022-08-13 DIAGNOSIS — K219 Gastro-esophageal reflux disease without esophagitis: Secondary | ICD-10-CM | POA: Diagnosis not present

## 2022-08-13 DIAGNOSIS — R69 Illness, unspecified: Secondary | ICD-10-CM | POA: Diagnosis not present

## 2022-08-13 DIAGNOSIS — Z09 Encounter for follow-up examination after completed treatment for conditions other than malignant neoplasm: Secondary | ICD-10-CM | POA: Insufficient documentation

## 2022-08-13 DIAGNOSIS — M7542 Impingement syndrome of left shoulder: Secondary | ICD-10-CM

## 2022-08-13 DIAGNOSIS — S43432A Superior glenoid labrum lesion of left shoulder, initial encounter: Secondary | ICD-10-CM | POA: Diagnosis not present

## 2022-08-13 DIAGNOSIS — M12812 Other specific arthropathies, not elsewhere classified, left shoulder: Secondary | ICD-10-CM

## 2022-08-13 HISTORY — PX: SHOULDER ARTHROSCOPY WITH ROTATOR CUFF REPAIR AND SUBACROMIAL DECOMPRESSION: SHX5686

## 2022-08-13 SURGERY — SHOULDER ARTHROSCOPY WITH ROTATOR CUFF REPAIR AND SUBACROMIAL DECOMPRESSION
Anesthesia: Regional | Laterality: Left

## 2022-08-13 MED ORDER — LACTATED RINGERS IV SOLN: INTRAVENOUS | Status: DC

## 2022-08-13 MED ORDER — EPINEPHRINE PF 1 MG/ML IJ SOLN
INTRAMUSCULAR | Status: DC | PRN
  Administered 2022-08-13: 1 mg

## 2022-08-13 MED ORDER — PROPOFOL 10 MG/ML IV BOLUS
INTRAVENOUS | Status: DC | PRN
  Administered 2022-08-13: 150 mg via INTRAVENOUS

## 2022-08-13 MED ORDER — BUPIVACAINE LIPOSOME 1.3 % IJ SUSP
INTRAMUSCULAR | Status: DC | PRN
  Administered 2022-08-13: 10 mL via PERINEURAL

## 2022-08-13 MED ORDER — EPHEDRINE SULFATE-NACL 50-0.9 MG/10ML-% IV SOSY
PREFILLED_SYRINGE | INTRAVENOUS | Status: DC | PRN
  Administered 2022-08-13: 400 mg via INTRAVENOUS

## 2022-08-13 MED ORDER — LIDOCAINE 2% (20 MG/ML) 5 ML SYRINGE
INTRAMUSCULAR | Status: DC | PRN
  Administered 2022-08-13: 60 mg via INTRAVENOUS

## 2022-08-13 MED ORDER — GENTAMICIN IN SALINE 1.6-0.9 MG/ML-% IV SOLN
80.0000 mg | INTRAVENOUS | Status: AC
  Administered 2022-08-13: 80 mg via INTRAVENOUS
  Filled 2022-08-13: qty 50

## 2022-08-13 MED ORDER — PHENYLEPHRINE HCL-NACL 20-0.9 MG/250ML-% IV SOLN
INTRAVENOUS | Status: DC | PRN
  Administered 2022-08-13: 55 ug/min via INTRAVENOUS

## 2022-08-13 MED ORDER — DEXAMETHASONE SODIUM PHOSPHATE 10 MG/ML IJ SOLN
INTRAMUSCULAR | Status: DC | PRN
  Administered 2022-08-13: 5 mg via INTRAVENOUS

## 2022-08-13 MED ORDER — FENTANYL CITRATE (PF) 100 MCG/2ML IJ SOLN
INTRAMUSCULAR | Status: DC | PRN
  Administered 2022-08-13: 50 ug via INTRAVENOUS

## 2022-08-13 MED ORDER — BUPIVACAINE-EPINEPHRINE (PF) 0.25% -1:200000 IJ SOLN
INTRAMUSCULAR | Status: DC | PRN
  Administered 2022-08-13: 30 mL

## 2022-08-13 MED ORDER — EPINEPHRINE PF 1 MG/ML IJ SOLN
INTRAMUSCULAR | Status: AC
  Filled 2022-08-13: qty 1

## 2022-08-13 MED ORDER — BUPIVACAINE-EPINEPHRINE (PF) 0.25% -1:200000 IJ SOLN
INTRAMUSCULAR | Status: AC
  Filled 2022-08-13: qty 30

## 2022-08-13 MED ORDER — ORAL CARE MOUTH RINSE: 15.0000 mL | Freq: Once | OROMUCOSAL | Status: AC

## 2022-08-13 MED ORDER — ONDANSETRON HCL 4 MG/2ML IJ SOLN
INTRAMUSCULAR | Status: DC | PRN
  Administered 2022-08-13: 4 mg via INTRAVENOUS

## 2022-08-13 MED ORDER — HYDROMORPHONE HCL 1 MG/ML IJ SOLN: 0.2500 mg | INTRAMUSCULAR | Status: DC | PRN

## 2022-08-13 MED ORDER — BUPIVACAINE HCL (PF) 0.5 % IJ SOLN
INTRAMUSCULAR | Status: AC
  Filled 2022-08-13: qty 30

## 2022-08-13 MED ORDER — IPRATROPIUM-ALBUTEROL 0.5-2.5 (3) MG/3ML IN SOLN
RESPIRATORY_TRACT | Status: AC
  Administered 2022-08-13: 3 mL via RESPIRATORY_TRACT
  Filled 2022-08-13: qty 3

## 2022-08-13 MED ORDER — CARVEDILOL 12.5 MG PO TABS
6.2500 mg | ORAL_TABLET | Freq: Once | ORAL | Status: AC
  Administered 2022-08-13: 6.25 mg via ORAL
  Filled 2022-08-13: qty 0.5
  Filled 2022-08-13: qty 1

## 2022-08-13 MED ORDER — ACETAMINOPHEN 500 MG PO TABS
1000.0000 mg | ORAL_TABLET | Freq: Once | ORAL | Status: AC
  Administered 2022-08-13: 1000 mg via ORAL
  Filled 2022-08-13: qty 2

## 2022-08-13 MED ORDER — MIDAZOLAM HCL 5 MG/5ML IJ SOLN
INTRAMUSCULAR | Status: DC | PRN
  Administered 2022-08-13: 1 mg via INTRAVENOUS

## 2022-08-13 MED ORDER — SODIUM CHLORIDE 0.9 % IR SOLN
Status: DC | PRN
  Administered 2022-08-13: 6000 mL

## 2022-08-13 MED ORDER — ROCURONIUM BROMIDE 10 MG/ML (PF) SYRINGE
PREFILLED_SYRINGE | INTRAVENOUS | Status: DC | PRN
  Administered 2022-08-13: 70 mg via INTRAVENOUS
  Administered 2022-08-13: 10 mg via INTRAVENOUS

## 2022-08-13 MED ORDER — FENTANYL CITRATE (PF) 100 MCG/2ML IJ SOLN
INTRAMUSCULAR | Status: AC
  Filled 2022-08-13: qty 2

## 2022-08-13 MED ORDER — CHLORHEXIDINE GLUCONATE 0.12 % MT SOLN
15.0000 mL | Freq: Once | OROMUCOSAL | Status: AC
  Administered 2022-08-13: 15 mL via OROMUCOSAL

## 2022-08-13 MED ORDER — SUGAMMADEX SODIUM 500 MG/5ML IV SOLN
INTRAVENOUS | Status: AC
  Filled 2022-08-13: qty 5

## 2022-08-13 MED ORDER — MIDAZOLAM HCL 2 MG/2ML IJ SOLN
INTRAMUSCULAR | Status: AC
  Filled 2022-08-13: qty 2

## 2022-08-13 MED ORDER — IPRATROPIUM-ALBUTEROL 0.5-2.5 (3) MG/3ML IN SOLN: 3.0000 mL | Freq: Once | RESPIRATORY_TRACT | Status: AC

## 2022-08-13 MED ORDER — BUPIVACAINE-EPINEPHRINE (PF) 0.5% -1:200000 IJ SOLN
INTRAMUSCULAR | Status: DC | PRN
  Administered 2022-08-13: 15 mL via PERINEURAL

## 2022-08-13 MED ORDER — VANCOMYCIN HCL 1500 MG/300ML IV SOLN
1500.0000 mg | INTRAVENOUS | Status: AC
  Administered 2022-08-13: 1500 mg via INTRAVENOUS
  Filled 2022-08-13: qty 300

## 2022-08-13 SURGICAL SUPPLY — 66 items
ANCHOR NDL 9/16 CIR SZ 8 (NEEDLE) IMPLANT
ANCHOR NEEDLE 9/16 CIR SZ 8 (NEEDLE) IMPLANT
BAG COUNTER SPONGE SURGICOUNT (BAG) IMPLANT
BLADE SHAVER TORPEDO 4X13 (MISCELLANEOUS) ×1 IMPLANT
BLADE SURG SZ11 CARB STEEL (BLADE) ×1 IMPLANT
BURR OVAL 8 FLU 4.0X13 (MISCELLANEOUS) IMPLANT
CANNULA 5.75X7 CRYSTAL CLEAR (CANNULA) IMPLANT
CANNULA ACUFLEX KIT 5X76 (CANNULA) IMPLANT
CANNULA ACUFO 5X76 (CANNULA) IMPLANT
CLEANER TIP ELECTROSURG 2X2 (MISCELLANEOUS) IMPLANT
COVER SURGICAL LIGHT HANDLE (MISCELLANEOUS) ×1 IMPLANT
DRAPE IMP U-DRAPE 54X76 (DRAPES) ×1 IMPLANT
DRAPE ORTHO SPLIT 77X108 STRL (DRAPES) ×2
DRAPE POUCH INSTRU U-SHP 10X18 (DRAPES) ×1 IMPLANT
DRAPE STERI 35X30 U-POUCH (DRAPES) ×1 IMPLANT
DRAPE SURG ORHT 6 SPLT 77X108 (DRAPES) ×2 IMPLANT
DRSG AQUACEL AG ADV 3.5X 4 (GAUZE/BANDAGES/DRESSINGS) IMPLANT
DRSG AQUACEL AG ADV 3.5X 6 (GAUZE/BANDAGES/DRESSINGS) IMPLANT
DURAPREP 26ML APPLICATOR (WOUND CARE) ×1 IMPLANT
DW OUTFLOW CASSETTE/TUBE SET (MISCELLANEOUS) ×1 IMPLANT
ELECT NDL TIP 2.8 STRL (NEEDLE) ×1 IMPLANT
ELECT NEEDLE TIP 2.8 STRL (NEEDLE) ×1 IMPLANT
ELECT REM PT RETURN 15FT ADLT (MISCELLANEOUS) ×1 IMPLANT
FILTER STRAW (MISCELLANEOUS) ×1 IMPLANT
GAUZE PAD ABD 8X10 STRL (GAUZE/BANDAGES/DRESSINGS) IMPLANT
GAUZE SPONGE 4X4 12PLY STRL (GAUZE/BANDAGES/DRESSINGS) IMPLANT
GLOVE BIOGEL PI IND STRL 7.0 (GLOVE) ×1 IMPLANT
GLOVE BIOGEL PI IND STRL 8 (GLOVE) ×1 IMPLANT
GLOVE SURG SS PI 8.0 STRL IVOR (GLOVE) ×2 IMPLANT
GOWN STRL REUS W/ TWL XL LVL3 (GOWN DISPOSABLE) ×2 IMPLANT
GOWN STRL REUS W/TWL XL LVL3 (GOWN DISPOSABLE) ×2
KIT BASIN OR (CUSTOM PROCEDURE TRAY) ×1 IMPLANT
KIT TURNOVER KIT A (KITS) IMPLANT
MANIFOLD NEPTUNE II (INSTRUMENTS) ×1 IMPLANT
NDL SCORPION MULTI FIRE (NEEDLE) IMPLANT
NDL SPNL 18GX3.5 QUINCKE PK (NEEDLE) ×1 IMPLANT
NEEDLE SCORPION MULTI FIRE (NEEDLE) IMPLANT
NEEDLE SPNL 18GX3.5 QUINCKE PK (NEEDLE) ×1 IMPLANT
PACK SHOULDER (CUSTOM PROCEDURE TRAY) ×1 IMPLANT
PORT APPOLLO RF 90DEGREE MULTI (SURGICAL WAND) ×1 IMPLANT
PROTECTOR NERVE ULNAR (MISCELLANEOUS) ×1 IMPLANT
RESTRAINT HEAD UNIVERSAL NS (MISCELLANEOUS) ×1 IMPLANT
SLING ARM FOAM STRAP LRG (SOFTGOODS) IMPLANT
SLING ARM IMMOBILIZER LRG (SOFTGOODS) IMPLANT
SLING ARM IMMOBILIZER MED (SOFTGOODS) IMPLANT
SLING ULTRA II L (ORTHOPEDIC SUPPLIES) IMPLANT
STRIP CLOSURE SKIN 1/2X4 (GAUZE/BANDAGES/DRESSINGS) IMPLANT
SUCTION FRAZIER HANDLE 12FR (TUBING) ×1
SUCTION TUBE FRAZIER 12FR DISP (TUBING) ×1 IMPLANT
SUT ETHIBOND NAB CT1 #1 30IN (SUTURE) IMPLANT
SUT ETHILON 4 0 PS 2 18 (SUTURE) ×1 IMPLANT
SUT FIBERWIRE #2 38 T-5 BLUE (SUTURE)
SUT PROLENE 3 0 PS 2 (SUTURE) ×1 IMPLANT
SUT TIGER TAPE 7 IN WHITE (SUTURE) IMPLANT
SUT VIC AB 0 CT1 27 (SUTURE) ×1
SUT VIC AB 0 CT1 27XBRD ANTBC (SUTURE) ×1 IMPLANT
SUT VIC AB 1-0 CT2 27 (SUTURE) IMPLANT
SUT VIC AB 2-0 CT2 27 (SUTURE) IMPLANT
SUT VICRYL 0 UR6 27IN ABS (SUTURE) ×1 IMPLANT
SUTURE FIBERWR #2 38 T-5 BLUE (SUTURE) IMPLANT
SYR 20ML LL LF (SYRINGE) ×1 IMPLANT
TAPE FIBER 2MM 7IN #2 BLUE (SUTURE) IMPLANT
TOWEL OR 17X26 10 PK STRL BLUE (TOWEL DISPOSABLE) ×1 IMPLANT
TUBING ARTHROSCOPY IRRIG 16FT (MISCELLANEOUS) ×1 IMPLANT
TUBING CONNECTING 10 (TUBING) ×2 IMPLANT
WIPE CHG 2% PREP (PERSONAL CARE ITEMS) ×1 IMPLANT

## 2022-08-13 NOTE — Anesthesia Procedure Notes (Signed)
Anesthesia Regional Block: Interscalene brachial plexus block   Pre-Anesthetic Checklist: , timeout performed,  Correct Patient, Correct Site, Correct Laterality,  Correct Procedure, Correct Position, site marked,  Risks and benefits discussed,  Pre-op evaluation,  At surgeon's request and post-op pain management  Laterality: Left  Prep: Maximum Sterile Barrier Precautions used, chloraprep       Needles:  Injection technique: Single-shot  Needle Type: Echogenic Stimulator Needle     Needle Length: 9cm  Needle Gauge: 21     Additional Needles:   Procedures:, nerve stimulator,,, ultrasound used (permanent image in chart),,     Nerve Stimulator or Paresthesia:  Response: Biceps response  Additional Responses:   Narrative:  Start time: 08/13/2022 7:51 AM End time: 08/13/2022 8:01 AM Injection made incrementally with aspirations every 5 mL.  Performed by: Personally  Anesthesiologist: Roderic Palau, MD

## 2022-08-13 NOTE — Brief Op Note (Signed)
08/13/2022  8:43 AM  PATIENT:  Shannon [name]  52 y.o. female  PRE-OPERATIVE DIAGNOSIS:  Impingement syndrome left shoulder  POST-OPERATIVE DIAGNOSIS:  * No post-op diagnosis entered *  PROCEDURE:  Procedure(s): SHOULDER ARTHROSCOPY WITH SUBACROMIAL DECOMPRESSION, DEBRIDEMENT AND PARTIAL ROTATOR CUFF REPAIR (Left)  DIAGNOSES: Left shoulder, acute on chronic rotator cuff tear, SLAP tear, and subacromial impingement.  POST-OPERATIVE DIAGNOSIS: same  PROCEDURE: Arthroscopic extensive debridement - [place], Supraspinatus Tendon, Anterior Labrum, Superior Labrum, and infraspinatus tendon   OPERATIVE FINDING: Exam under anesthesia: Normal Articular space:  Mild grade III chondromalacia Chondral surfaces:  Mild grade III chondromalacia Biceps: Normal Subscapularis: Intact normal Supraspinatus: Incomplete tear 40% Infraspinatus: Incomplete tear present  SURGEON:  Surgeon(s) and Role:    Susa Day, MD - Primary  PHYSICIAN ASSISTANT:   ASSISTANTS: [place]   ANESTHESIA:   regional  EBL: Minimal  BLOOD ADMINISTERED:none DRAINS: none   LOCAL MEDICATIONS USED:  MARCAINE     SPECIMEN:  No Specimen  DISPOSITION OF SPECIMEN:  N/A  COUNTS:  YES  TOURNIQUET:  * No tourniquets in log *  DICTATION: .Other Dictation: Dictation Number [id number]   PLAN OF CARE: Discharge to home after PACU  PATIENT DISPOSITION:  PACU - hemodynamically stable.   Delay start of Pharmacological VTE agent (>24hrs) due to surgical blood loss or risk of bleeding: no

## 2022-08-13 NOTE — Anesthesia Postprocedure Evaluation (Signed)
Anesthesia Post Note  Patient: Shannon [name]  Procedure(s) Performed: SHOULDER ARTHROSCOPY WITH SUBACROMIAL DECOMPRESSION, DEBRIDEMENT AND PARTIAL ROTATOR CUFF REPAIR (Left)     Patient location during evaluation: PACU Anesthesia Type: Regional and General Level of consciousness: awake and alert Pain management: pain level controlled Vital Signs Assessment: post-procedure vital signs reviewed and stable Respiratory status: spontaneous breathing, nonlabored ventilation and respiratory function stable Cardiovascular status: blood pressure returned to baseline and stable Postop Assessment: no apparent nausea or vomiting Anesthetic complications: no  No notable events documented.  Last Vitals:  Vitals:   08/13/22 1100 08/13/22 1202  BP: (!) 143/88 124/84  Pulse: 81 86  Resp: (!) 23 18  Temp:    SpO2: 90% 94%    Last Pain:  Vitals:   08/13/22 1202  TempSrc:   PainSc: 0-No pain                 Rebacca Votaw,W. EDMOND

## 2022-08-13 NOTE — Transfer of Care (Signed)
Immediate Anesthesia Transfer of Care Note  Patient: Shannon [name]  Procedure(s) Performed: Procedure(s): SHOULDER ARTHROSCOPY WITH SUBACROMIAL DECOMPRESSION, DEBRIDEMENT AND PARTIAL ROTATOR CUFF REPAIR (Left)  Patient Location: PACU  Anesthesia Type:General  Level of Consciousness:  sedated, patient cooperative and responds to stimulation  Airway & Oxygen Therapy:Patient Spontanous Breathing and Patient connected to face mask oxgen  Post-op Assessment:  Report given to PACU RN and Post -op Vital signs reviewed and stable  Post vital signs:  Reviewed and stable  Last Vitals:  Vitals:   08/13/22 0650 08/13/22 1015  BP: (!) 158/104 139/89  Pulse: (!) 102 79  Resp: 15 18  Temp: 36.8 C   SpO2: [id number]    Complications: No apparent anesthesia complications

## 2022-08-13 NOTE — Interval H&P Note (Signed)
History and Physical Interval Note:  08/13/2022 8:36 AM  Shannon [name]  has presented today for surgery, with the diagnosis of Impingement syndrome left shoulder.  The various methods of treatment have been discussed with the patient and family. After consideration of risks, benefits and other options for treatment, the patient has consented to  Procedure(s): SHOULDER ARTHROSCOPY WITH SUBACROMIAL DECOMPRESSION, DEBRIDEMENT AND PARTIAL ROTATOR CUFF REPAIR (Left) as a surgical intervention.  The patient's history has been reviewed, patient examined, no change in status, stable for surgery.  I have reviewed the patient's chart and labs.  Questions were answered to the patient's satisfaction.     Johnn Hai

## 2022-08-13 NOTE — Anesthesia Procedure Notes (Signed)
Procedure Name: Intubation Date/Time: 08/13/2022 8:40 AM  Performed by: Lavina Hamman, CRNAPre-anesthesia Checklist: Patient identified, Emergency Drugs available, Suction available, Patient being monitored and Timeout performed Patient Re-evaluated:Patient Re-evaluated prior to induction Oxygen Delivery Method: Circle system utilized Preoxygenation: Pre-oxygenation with 100% oxygen Induction Type: IV induction Ventilation: Mask ventilation without difficulty Laryngoscope Size: Mac and 4 Grade View: Grade II Tube type: Oral Tube size: 7.0 mm Number of attempts: 1 Airway Equipment and Method: Stylet Placement Confirmation: ETT inserted through vocal cords under direct vision, positive ETCO2, CO2 detector and breath sounds checked- equal and bilateral Secured at: 22 cm Tube secured with: Tape Dental Injury: Teeth and Oropharynx as per pre-operative assessment  Comments: ATOI, small amount of clear liquid in oropharynx suctioned prior to intubation.

## 2022-08-13 NOTE — Discharge Instructions (Addendum)
SHOULDER ARTHROSCOPY POSTOPERATIVE INSTRUCTIONS FOR DR. Tonita Cong  1.  Ice pack on shoulder 3-4 times per day.  2.  May remove bandages in 72 hours and apply band-aids to sutures.  3,  May get out of sling in AM and start gentle pendulum exercises.  You are encouraged       to move the elbow, wrist and hand.  4.  Elevate operative shoulder and elbow on pillow.  5.  Exercise your fingers to help reduce swelling.  6.  Report to your doctor should any of the following situations occur:   -Swelling of your fingers.  -Inability to wiggle your fingers.  -Coldness, turning pale or blueness of your fingers.  -Loss of sensation, numbness or tingling of your fingers.  -Unusual small or odor from under dressing.  -Excessive bleeding or drainage from the surgical site(s).  -Severe pain which is not relieved by the pain medication your doctor prescribed                for you.  7.  Call the office to schedule and appointment for 10-14 days . 8.  Take one aspirin per day 354m with a meal if not on another blood thinner or allergic.  Use incentive spirometer hourly.  Patient Signature:  ________________________________________________________  Nurse's Signature:  ________________________________________________________

## 2022-08-13 NOTE — Op Note (Signed)
NAME: Shannon [name], STRANGER MEDICAL RECORD NO: [id number] ACCOUNT NO: [id number] DATE OF BIRTH: 1970-08-14 FACILITY: [facility] LOCATION: [facility] PHYSICIAN: Johnn Hai, MD  Operative Report   DATE OF PROCEDURE: 08/13/2022  PREOPERATIVE DIAGNOSES:   1.  Impingement syndrome, left shoulder. 2.  Partial tear of the supraspinatus and infraspinatus tendons of the rotator cuff.  3.  Elevated BMI of 40.87.  POSTOPERATIVE DIAGNOSES:   1.  Impingement syndrome, left shoulder. 2.  Partial tear of the supraspinatus and infraspinatus tendons of the rotator cuff.  3.  Elevated BMI of 40.87.  PROCEDURE PERFORMED:   1.  Extensive debridement with left shoulder arthroscopy, subacromial decompression with acromioplasty. 2.  Bursectomy subdeltoid subacromial. 3.  Labral debridement, anterior, superior and posterior. 4.  Debridement of supraspinatus and infraspinatus tendons of the rotator cuff.  ANESTHESIA:  General.  ASSISTANT:  Lacie Draft, PA.  HISTORY:  A 52 year old female with refractory shoulder pain, impingement pain, partial tear of the rotator cuff, articular side, supraspinatus, infraspinatus partial tear, bursal side.  Thickened CA ligament.  She was indicated for shoulder arthroscopy,  subacromial decompression, release of CA ligament, debridement of rotator cuff tendons, possible open rotator cuff repair.  Risks and benefits discussed including bleeding, infection, damage to neurovascular structures, no change in symptoms, worsening  symptoms, DVT, PE, anesthetic complications, etc.  DESCRIPTION OF PROCEDURE:  The patient in supine beach chair position.  After induction of adequate general anesthesia, vancomycin and gentamicin, the left shoulder and upper extremity was prepped and draped in the usual sterile fashion.  Range of motion  of the shoulder under anesthesia was normal.  A surgical marker was utilized to delineate the acromion, AC joint and coracoid.  The patient had increased  BMI, and ample subcutaneous adipose tissue.  We then used a #11 blade to fashion a posterior portal  through the skin only in the normal position and a second portal anterolaterally.  We placed the arthroscopic camera in the subacromial space triangulating with a cannula. In the subacromial space, hypertrophic bursa was noted obscuring visualization.   I introduced the shaver and performed a bursectomy.  Anterior, posterior subacromial and subdeltoid, particularly anterior and posterior.  There were adhesions noted posteriorly.  A thickened CA ligament was noted.  This was released and morselized with  an ArthroWand.  There was a small spur off the anterolateral aspect of the acromion, which was shaved to perform a small acromioplasty.  This increased the subacromial space.  Back posteriorly, there was some minor tearing of the infraspinatus tendon.   This was debrided with the shaver to good bleeding tissue.  This was probed.  This was partial, not full thickness.  Full inspection of the remainder of the cuff was unremarkable.  I then redirected the camera in the subacromial space with traction applied to the extremity by Lacie Draft, PA, in 70/30 position.  We advanced the cannula in the glenohumeral joint in line with the coracoid penetrating atraumatically.  Irrigant was  utilized to insufflate the joint.  Next, visualization of the glenohumeral joint revealed some mild grade 3 chondromalacia of the humeral head and the glenoid.  There was tearing of the anterior, superior and posterior labrum, degenerative type 1 not a  complete tear. The biceps tendon was intact.  There was minor tearing of the supraspinatus of the rotator cuff. Under direct visualization, an 18-gauge needle was utilized to localize an anterior portal just beneath the biceps tendon in the rotator  interval.  I advanced the cannula  penetrating the capsule.  I introduced the shaver and debrided the labrum anterior, superior and  posterior.  I debrided the supraspinatus.  I probed it. This was a very minor tear, less than 20%.  No grade IV changes in  the humeral head or the glenoid were noted.  After copious lavage, I redirected in the subacromial space with reinspection.  Electrocautery was utilized to achieve hemostasis.  Good decompression was noted of the rotator cuff.  Rotator cuff supraspinatus  was evaluated as well from the bursal side. No significant tear was noted.  I felt therefore an open repair was not necessary.  I then removed all instrumentation after a copious lavage, closed the portals with 4-0 nylon simple sutures.  0.25% Marcaine  with epinephrine was then injected into the subacromial space.  Wound was dressed sterilely.  She was placed in a sling, extubated without difficulty and transported to the recovery room in satisfactory condition.  The patient tolerated the procedure well.  No complications.  Minimal blood loss.  ASSISTANT:  Lacie Draft, PA.  PA was used throughout the case for traction to the extremity, monitoring inflow and outflow due to the patient's elevated BMI, which was 40.87.        PAA D: 08/13/2022 10:19:42 am T: 08/13/2022 11:05:00 am  JOB: [id number]

## 2022-08-14 ENCOUNTER — Encounter (HOSPITAL_COMMUNITY): Payer: Self-pay | Admitting: Specialist

## 2022-08-15 ENCOUNTER — Other Ambulatory Visit: Payer: Self-pay | Admitting: Internal Medicine

## 2022-08-15 DIAGNOSIS — M7918 Myalgia, other site: Secondary | ICD-10-CM | POA: Diagnosis not present

## 2022-08-15 DIAGNOSIS — M545 Low back pain, unspecified: Secondary | ICD-10-CM | POA: Diagnosis not present

## 2022-08-18 ENCOUNTER — Emergency Department (HOSPITAL_BASED_OUTPATIENT_CLINIC_OR_DEPARTMENT_OTHER): Payer: Medicare HMO

## 2022-08-18 ENCOUNTER — Other Ambulatory Visit: Payer: Self-pay

## 2022-08-18 ENCOUNTER — Emergency Department (HOSPITAL_BASED_OUTPATIENT_CLINIC_OR_DEPARTMENT_OTHER)
Admission: EM | Admit: 2022-08-18 | Discharge: 2022-08-19 | Disposition: A | Discharge status: Home / Self Care | Payer: Medicare HMO | Attending: Emergency Medicine | Admitting: Emergency Medicine

## 2022-08-18 ENCOUNTER — Encounter (HOSPITAL_BASED_OUTPATIENT_CLINIC_OR_DEPARTMENT_OTHER): Payer: Self-pay

## 2022-08-18 DIAGNOSIS — R0602 Shortness of breath: Secondary | ICD-10-CM | POA: Insufficient documentation

## 2022-08-18 DIAGNOSIS — R519 Headache, unspecified: Secondary | ICD-10-CM

## 2022-08-18 DIAGNOSIS — R531 Weakness: Secondary | ICD-10-CM

## 2022-08-18 DIAGNOSIS — Z79899 Other long term (current) drug therapy: Secondary | ICD-10-CM | POA: Insufficient documentation

## 2022-08-18 DIAGNOSIS — Z1152 Encounter for screening for COVID-19: Secondary | ICD-10-CM | POA: Insufficient documentation

## 2022-08-18 DIAGNOSIS — I10 Essential (primary) hypertension: Secondary | ICD-10-CM | POA: Diagnosis not present

## 2022-08-18 LAB — CBC WITH DIFFERENTIAL/PLATELET
Abs Immature Granulocytes: 0.01 10*3/uL (ref 0.00–0.07)
Basophils Absolute: 0 10*3/uL (ref 0.0–0.1)
Basophils Relative: 1 %
Eosinophils Absolute: 0.3 10*3/uL (ref 0.0–0.5)
Eosinophils Relative: 5 %
HCT: 32.9 % — ABNORMAL LOW (ref 36.0–46.0)
Hemoglobin: 10.8 g/dL — ABNORMAL LOW (ref 12.0–15.0)
Immature Granulocytes: 0 %
Lymphocytes Relative: 29 %
Lymphs Abs: 1.7 10*3/uL (ref 0.7–4.0)
MCH: 29.3 pg (ref 26.0–34.0)
MCHC: 32.8 g/dL (ref 30.0–36.0)
MCV: 89.4 fL (ref 80.0–100.0)
Monocytes Absolute: 0.3 10*3/uL (ref 0.1–1.0)
Monocytes Relative: 5 %
Neutro Abs: 3.6 10*3/uL (ref 1.7–7.7)
Neutrophils Relative %: 60 %
Platelets: 297 10*3/uL (ref 150–400)
RBC: 3.68 MIL/uL — ABNORMAL LOW (ref 3.87–5.11)
RDW: 13.2 % (ref 11.5–15.5)
WBC: 5.9 10*3/uL (ref 4.0–10.5)
nRBC: 0 % (ref 0.0–0.2)

## 2022-08-18 LAB — URINALYSIS, ROUTINE W REFLEX MICROSCOPIC
Bilirubin Urine: NEGATIVE
Glucose, UA: NEGATIVE mg/dL
Hgb urine dipstick: NEGATIVE
Ketones, ur: NEGATIVE mg/dL
Leukocytes,Ua: NEGATIVE
Nitrite: NEGATIVE
Protein, ur: 30 mg/dL — AB
Specific Gravity, Urine: >=1.03 (ref 1.005–1.030)
pH: 6.5 (ref 5.0–8.0)

## 2022-08-18 LAB — URINALYSIS, MICROSCOPIC (REFLEX)

## 2022-08-18 LAB — RESP PANEL BY RT-PCR (RSV, FLU A&B, COVID)  RVPGX2
Influenza A by PCR: NEGATIVE
Influenza B by PCR: NEGATIVE
Resp Syncytial Virus by PCR: NEGATIVE
SARS Coronavirus 2 by RT PCR: NEGATIVE

## 2022-08-18 LAB — BASIC METABOLIC PANEL
Anion gap: 8 (ref 5–15)
BUN: 14 mg/dL (ref 6–20)
CO2: 23 mmol/L (ref 22–32)
Calcium: 8.5 mg/dL — ABNORMAL LOW (ref 8.9–10.3)
Chloride: 101 mmol/L (ref 98–111)
Creatinine, Ser: 0.87 mg/dL (ref 0.44–1.00)
GFR, Estimated: >60 mL/min (ref >60)
Glucose, Bld: 100 mg/dL — ABNORMAL HIGH (ref 70–99)
Potassium: 3.4 mmol/L — ABNORMAL LOW (ref 3.5–5.1)
Sodium: 132 mmol/L — ABNORMAL LOW (ref 135–145)

## 2022-08-18 LAB — TROPONIN I (HIGH SENSITIVITY)
Troponin I (High Sensitivity): 4 ng/L (ref <18)
Troponin I (High Sensitivity): 4 ng/L (ref <18)

## 2022-08-18 LAB — D-DIMER, QUANTITATIVE: D-Dimer, Quant: 0.61 ug/mL-FEU — ABNORMAL HIGH (ref 0.00–0.50)

## 2022-08-18 MED ORDER — SODIUM CHLORIDE 0.9 % IV SOLN
12.5000 mg | Freq: Once | INTRAVENOUS | Status: AC
  Administered 2022-08-18: 12.5 mg via INTRAVENOUS
  Filled 2022-08-18: qty 0.5

## 2022-08-18 MED ORDER — LACTATED RINGERS IV BOLUS
1000.0000 mL | Freq: Once | INTRAVENOUS | Status: AC
  Administered 2022-08-18: 1000 mL via INTRAVENOUS

## 2022-08-18 MED ORDER — KETOROLAC TROMETHAMINE 15 MG/ML IJ SOLN
15.0000 mg | Freq: Once | INTRAMUSCULAR | Status: AC
  Administered 2022-08-18: 15 mg via INTRAVENOUS
  Filled 2022-08-18: qty 1

## 2022-08-18 MED ORDER — DIPHENHYDRAMINE HCL 25 MG PO CAPS
25.0000 mg | ORAL_CAPSULE | Freq: Once | ORAL | Status: AC
  Administered 2022-08-18: 25 mg via ORAL
  Filled 2022-08-18: qty 1

## 2022-08-18 MED ORDER — PROMETHAZINE HCL 25 MG/ML IJ SOLN
INTRAMUSCULAR | Status: AC
  Filled 2022-08-18: qty 1

## 2022-08-18 MED ORDER — ACETAMINOPHEN 500 MG PO TABS
1000.0000 mg | ORAL_TABLET | Freq: Once | ORAL | Status: AC
  Administered 2022-08-18: 1000 mg via ORAL
  Filled 2022-08-18: qty 2

## 2022-08-18 NOTE — ED Triage Notes (Signed)
Pt also reports headache and generalized body aches

## 2022-08-18 NOTE — ED Notes (Signed)
Pt unable to leave urine sample at this time.

## 2022-08-18 NOTE — ED Triage Notes (Signed)
Pt reports generalized weakness/fatigue for past couple of days. Pt reports left shoulder surgery last Wednesday to repair partial rotator cuff tear. Pt reports mild SOB that is constant. Denies fevers at home, temp 99.40F on triage.

## 2022-08-18 NOTE — ED Provider Notes (Signed)
[facility] [facility] Provider Note   CSN: [id number] Arrival date & time: 08/18/22  1910     History {Add pertinent medical, surgical, social history, OB history to HPI:1} Chief Complaint  Patient presents with   Weakness    Shannon [name] is a 52 y.o. female with ILD, OA, obesity reflux, RA, OSA, Raynaud's presents with generalized weakness.   Pt reports generalized weakness/fatigue for past couple of days. Pt reports left shoulder surgery last Wednesday to repair partial rotator cuff tear. Pt reports mild SOB that is constant and normal for her but maybe worse recently, patient has diagnosis of ILD, does not wear oxygen at home. Also had lightheadedness with standing and headache over the last couple of days.  Thinks she has been eating/drinking okay but she stays thirsty. Headache rated 8/10, comes and goes, worse with standing and located frontal region, sharp in nature. Tried ibuprofen for headache which didn't help. Denies fevers/chills, CP, nausea/vomiting/diarrhea/constipation, urinary symptoms. Minimal post-op pain in L shoulder, incisions no pus drainage or redness, no numbness/tingling in L arm, still in sling. Endorses mild leg swelling bilaterally. Is supposed to be taking AC after surgery but could not get them filled until today, started them today. No h/o DVT/PE.    Weakness      Home Medications Prior to Admission medications   Medication Sig Start Date End Date Taking? Authorizing Provider  albuterol (VENTOLIN HFA) 108 (90 Base) MCG/ACT inhaler Inhale 2 puffs into the lungs every 6 (six) hours as needed for wheezing or shortness of breath. 11/02/18   Brand Males, MD  amitriptyline (ELAVIL) 25 MG tablet Take 25-75 mg by mouth at bedtime. 05/28/22   [provider]  Budeson-Glycopyrrol-Formoterol (BREZTRI AEROSPHERE) 160-9-4.8 MCG/ACT AERO Inhale 2 puffs into the lungs in the morning and at bedtime. Patient not taking:  Reported on 07/31/2022 04/17/22   Brand Males, MD  buPROPion (WELLBUTRIN XL) 300 MG 24 hr tablet Take 300 mg by mouth daily.    [provider]  busPIRone (BUSPAR) 15 MG tablet Take 15 mg by mouth daily.    [provider]  carvedilol (COREG) 6.25 MG tablet Take 6.25 mg by mouth 2 (two) times daily. 08/11/21   [provider]  cetirizine (ZYRTEC) 10 MG tablet TAKE 1 TABLET BY MOUTH AT BEDTIME 08/12/22   Cobb, Karie Schwalbe, NP  doxycycline (VIBRAMYCIN) 100 MG capsule Take 1 capsule (100 mg total) by mouth 2 (two) times daily. Patient not taking: Reported on 07/31/2022 05/03/22   Jaynee Eagles, PA-C  fluticasone [facility]) 50 MCG/ACT nasal spray Place 2 sprays into both nostrils daily as needed for allergies. 05/30/21   [provider]  hydroxychloroquine (PLAQUENIL) 200 MG tablet Take 400 mg by mouth daily.     [provider]  ibuprofen (ADVIL) 200 MG tablet Take 600 mg by mouth every 6 (six) hours as needed for moderate pain.    [provider]  lisinopril (ZESTRIL) 10 MG tablet Take 10 mg by mouth daily.    [provider]  Magnesium 250 MG TABS Take 250 mg by mouth at bedtime.    [provider]  montelukast (SINGULAIR) 10 MG tablet TAKE 1 TABLET BY MOUTH AT BEDTIME 08/15/22   Brand Males, MD  mycophenolate (CELLCEPT) 500 MG tablet Take 2 tablets (1,000 mg total) by mouth 2 (two) times daily. 05/02/20   Edmisten, Kristie L, PA  Oxycodone HCl 10 MG TABS Take 10 mg by mouth 4 (four)  times daily. 12/12/19   [provider]  QUEtiapine (SEROQUEL XR) 300 MG 24 hr tablet Take 300 mg by mouth at bedtime.    [provider]  sertraline (ZOLOFT) 100 MG tablet Take 200 mg by mouth at bedtime. 02/23/18   [provider]      Allergies    Asa [aspirin], Penicillins, and Sulfa antibiotics    Review of Systems   Review of Systems  Neurological:  Positive for weakness.   Review of systems Negative for f/c.   A 10 point review of systems was performed and is negative unless otherwise reported in HPI.  Physical Exam Updated Vital Signs BP (!) 155/85 (BP Location: Left Arm)   Pulse 90   Temp 98.8 F (37.1 C) (Oral)   Resp 18   Ht '5\' 11"'$  (1.803 m)   Wt 132.9 kg   LMP 04/28/2015   SpO2 98%   BMI 40.87 kg/m  Physical Exam General: Normal appearing female, lying in bed.  HEENT: PERRLA, EOMI, Sclera anicteric, MMM, trachea midline. Tongue protrudes midline. Cardiology: RRR, no murmurs/rubs/gallops. BL radial and DP pulses equal bilaterally.  Resp: Normal respiratory rate and effort. CTAB, no wheezes, rhonchi, crackles.  Abd: Soft, non-tender, non-distended. No rebound tenderness or guarding.  GU: Deferred. MSK: L arm in sling. NVI. Laparoscopic surgical sites on L shoulder healing well with sutures still in place, no erythema/induration/fluctuance or purulent drainage. No significant edema/swelling of L shoulder, no significant TTP. ROM avoided d/t patient in sling.  BL LEs with 1+ nonpitting edema. No asymmetric edema/induration/inflammation/erythema. Skin: warm, dry. No rashes or lesions. Back: No CVA tenderness Neuro: A&Ox4, CNs II-XII grossly intact. MAEs. Sensation grossly intact.  Psych: Normal mood and affect.   ED Results / Procedures / Treatments   Labs (all labs ordered are listed, but only abnormal results are displayed) Labs Reviewed  BASIC METABOLIC PANEL - Abnormal; Notable for the following components:      Result Value   Sodium 132 (*)    Potassium 3.4 (*)    Glucose, Bld 100 (*)    Calcium 8.5 (*)    All other components within normal limits  CBC WITH DIFFERENTIAL/PLATELET - Abnormal; Notable for the following components:   RBC 3.68 (*)    Hemoglobin 10.8 (*)    HCT 32.9 (*)    All other components within normal limits  URINALYSIS, ROUTINE W REFLEX MICROSCOPIC - Abnormal; Notable for the following components:   APPearance HAZY (*)    Protein, ur 30 (*)    All  other components within normal limits  D-DIMER, QUANTITATIVE - Abnormal; Notable for the following components:   D-Dimer, Quant 0.61 (*)    All other components within normal limits  URINALYSIS, MICROSCOPIC (REFLEX) - Abnormal; Notable for the following components:   Bacteria, UA RARE (*)    All other components within normal limits  RESP PANEL BY RT-PCR (RSV, FLU A&B, COVID)  RVPGX2  TROPONIN I (HIGH SENSITIVITY)  TROPONIN I (HIGH SENSITIVITY)    EKG EKG Interpretation  Date/Time:  Monday August 18 2022 19:34:25 EST Ventricular Rate:  103 PR Interval:  138 QRS Duration: 112 QT Interval:  371 QTC Calculation: 486 R Axis:   -18 Text Interpretation: Sinus tachycardia LAE, consider biatrial enlargement Confirmed by Cindee Lame [phone]) on 08/18/2022 7:50:32 PM  Radiology CXR: IMPRESSION: No active cardiopulmonary disease  Procedures Procedures  {Document cardiac monitor, telemetry assessment procedure when appropriate:1}  Medications Ordered in ED Medications  lactated ringers bolus 1,000  mL (0 mLs Intravenous Stopped 08/19/22 0022)  acetaminophen (TYLENOL) tablet 1,000 mg (1,000 mg Oral Given 08/18/22 2133)  ketorolac (TORADOL) 15 MG/ML injection 15 mg (15 mg Intravenous Given 08/18/22 2154)  promethazine (PHENERGAN) 12.5 mg in sodium chloride 0.9 % 50 mL IVPB (0 mg Intravenous Stopped 08/19/22 0022)  diphenhydrAMINE (BENADRYL) capsule 25 mg (25 mg Oral Given 08/18/22 2133)    ED Course/ Medical Decision Making/ A&P                          Medical Decision Making Amount and/or Complexity of Data Reviewed Labs: ordered. Decision-making details documented in ED Course. Radiology: ordered. Decision-making details documented in ED Course.  Risk OTC drugs. Prescription drug management.    This patient presents to the ED for concern of generalized weakness, headache, lightheadedness; this involves an extensive number of treatment options, and is a complaint that carries  with it a high risk of complications and morbidity.  I considered the following differential and admission for this acute, potentially life threatening condition. Patient is overall very well-appearing, HDS, non-toxic, afebrile.   MDM:    DDX for lightheadedness, headache, includes but is not limited to:  Infectious processes (no concern on history of physical exam for surgical site infection), severe metabolic derangements or electrolyte abnormalities, ischemia/ACS, heart failure, anemia, and intracranial/central processes but think these are unlikely given the history and physical exam. ***  Patient does endorse mild SOB after surgery but does have h/o ILD. Does not wear oxygen at home. No hypoxia, cough, increased WOB, or tachypnea noted. Patient does have BL leg swelling but no signs/symptoms of DVT. Couldn't get her AC filled until today so hasn't had AC as instructed for 1 week post-op. With report of increased SOB will consider PE given recent surgery and will obtain D-dimer but overall low concern at this point.   For this patient's headache: Most likely 2/2 tension headache, migraine, or headache of non-emergent etiology. No focal neurological symptoms. Neuro exam is benign.  Unlikely SAH: headache is non thunderclap Unlikely Subdural/epidural hematoma: no history of trauma, no anticoagulation. Unlikely Meningitis: afebrile, no meningismus. Unlikely Acute angle glaucoma: PERRLA, no eye pain.  Plan: Will give headache cocktail and reexamine.    Clinical Course as of 08/27/22 1731  Mon Aug 18, 2022  2037 Troponin I (High Sensitivity): 4 [HN]  2037 WBC: 5.9 No leukocytosis [HN]  2038 DG Chest 2 View FINDINGS: The heart size and mediastinal contours are upper limits of normal in size. Both lungs are clear. The visualized skeletal structures are unremarkable.  IMPRESSION: No active cardiopulmonary disease.   [HN]  2116 Resp panel by RT-PCR (RSV, Flu A&B, Covid) Anterior Nasal  Swab Neg [HN]  2213 D-Dimer, Quant(!): 0.61 Negative by years criteria [HN]  2249 Troponin I (High Sensitivity): 4 Repeat trop is flat [HN]    Clinical Course User Index [HN] Audley Hose, MD    Labs: I Ordered, and personally interpreted labs.  The pertinent results include:  those listed above  Imaging Studies ordered: I ordered imaging studies including CXR I independently visualized and interpreted imaging. I agree with the radiologist interpretation  Additional history obtained from chart review.    Cardiac Monitoring: The patient was maintained on a cardiac monitor.  I personally viewed and interpreted the cardiac monitored which showed an underlying rhythm of: NSR  Reevaluation: After the interventions noted above, I reevaluated the patient and found that they have :improved  Social Determinants of Health: Patient lives independently   Disposition:  Patient is signed out to the oncoming ED physician who is made aware of her history, presentation, exam, workup, and plan. Plan is to give headache cocktail and reassess.    Co morbidities that complicate the patient evaluation  Past Medical History:  Diagnosis Date   Anemia    Anxiety    Depression    Dyspnea    Sometimes at rest   G6PD deficiency    GERD (gastroesophageal reflux disease)    Hypertension    ILD (interstitial lung disease) (HCC)    Lupus (HCC)    Pneumonia    Rheumatoid arthritis ([place])    Sleep apnea      Medicines Meds ordered this encounter  Medications   lactated ringers bolus 1,000 mL   acetaminophen (TYLENOL) tablet 1,000 mg   ketorolac (TORADOL) 15 MG/ML injection 15 mg   promethazine (PHENERGAN) 12.5 mg in sodium chloride 0.9 % 50 mL IVPB   diphenhydrAMINE (BENADRYL) capsule 25 mg   DISCONTD: promethazine (PHENERGAN) 25 MG/ML injection    Larose Kells N: cabinet override    I have reviewed the patients home medicines and have made adjustments as needed  Problem List /  ED Course: Problem List Items Addressed This Visit   None Visit Diagnoses     Weakness    -  Primary   Bad headache       Relevant Medications   acetaminophen (TYLENOL) tablet 1,000 mg (Completed)   ketorolac (TORADOL) 15 MG/ML injection 15 mg (Completed)            {Document critical care time when appropriate:1} {Document review of labs and clinical decision tools ie heart score, Chads2Vasc2 etc:1}  {Document your independent review of radiology images, and any outside records:1} {Document your discussion with family members, caretakers, and with consultants:1} {Document social determinants of health affecting pt's care:1} {Document your decision making why or why not admission, treatments were needed:1}  This note was created using dictation software, which may contain spelling or grammatical errors.

## 2022-08-19 NOTE — ED Provider Notes (Signed)
  Physical Exam  BP (!) 155/85   Pulse (!) 101   Temp 98.9 F (37.2 C) (Oral)   Resp 19   Ht 5' 11"$  (1.803 m)   Wt 132.9 kg   LMP 04/28/2015   SpO2 98%   BMI 40.87 kg/m   Physical Exam Vitals and nursing note reviewed.  Constitutional:      General: She is not in acute distress.    Appearance: She is well-developed. She is not diaphoretic.  HENT:     Head: Normocephalic and atraumatic.  Cardiovascular:     Rate and Rhythm: Normal rate and regular rhythm.     Heart sounds: No murmur heard.    No friction rub. No gallop.  Pulmonary:     Effort: Pulmonary effort is normal. No respiratory distress.     Breath sounds: Normal breath sounds. No wheezing.  Abdominal:     General: Bowel sounds are normal. There is no distension.     Palpations: Abdomen is soft.     Tenderness: There is no abdominal tenderness.  Musculoskeletal:        General: Normal range of motion.     Cervical back: Normal range of motion and neck supple.  Skin:    General: Skin is warm and dry.  Neurological:     General: No focal deficit present.     Mental Status: She is alert and oriented to person, place, and time.     Procedures  Procedures  ED Course / MDM   Clinical Course as of 08/19/22 0008  Mon Aug 18, 2022  2037 Troponin I (High Sensitivity): 4 [HN]  2037 WBC: 5.9 No leukocytosis [HN]  2038 DG Chest 2 View FINDINGS: The heart size and mediastinal contours are upper limits of normal in size. Both lungs are clear. The visualized skeletal structures are unremarkable.  IMPRESSION: No active cardiopulmonary disease.   [HN]  2116 Resp panel by RT-PCR (RSV, Flu A&B, Covid) Anterior Nasal Swab Neg [HN]  2213 D-Dimer, Quant(!): 0.61 Negative by years criteria [HN]  2249 Troponin I (High Sensitivity): 4 Repeat trop is flat [HN]    Clinical Course User Index [HN] Audley Hose, MD   Medical Decision Making Amount and/or Complexity of Data Reviewed Labs: ordered. Decision-making  details documented in ED Course. Radiology: ordered. Decision-making details documented in ED Course.  Risk OTC drugs. Prescription drug management.   Care assumed from Dr. Mayra Neer at shift change.  Patient presenting here with complaints of headache, fatigue, and feeling generally unwell for the past 2 days.  She is status post rotator cuff repair last week.  She denies to me she is having any chest pain or difficulty breathing.  Care signed out to me awaiting reassessment and response to migraine cocktail provided earlier.  Patient tells me she is feeling much improved and seems appropriate for discharge to home.  She did have a D-dimer of 0.61, however is having no chest pain or difficulty breathing or symptoms that would be consistent with DVT/PE.  I suspect this elevation is related to recent surgery and have no reason to suspect thromboembolism.  Patient will be discharged with as needed return.       Veryl Speak, MD 08/19/22 Berniece Salines

## 2022-08-19 NOTE — Discharge Instructions (Signed)
Continue medications as previously prescribed.  Return to the ER if symptoms significantly worsen or change.

## 2022-08-22 DIAGNOSIS — G4733 Obstructive sleep apnea (adult) (pediatric): Secondary | ICD-10-CM | POA: Diagnosis not present

## 2022-08-22 DIAGNOSIS — G4710 Hypersomnia, unspecified: Secondary | ICD-10-CM | POA: Diagnosis not present

## 2022-08-25 DIAGNOSIS — Z6841 Body Mass Index (BMI) 40.0 and over, adult: Secondary | ICD-10-CM | POA: Diagnosis not present

## 2022-08-25 DIAGNOSIS — E559 Vitamin D deficiency, unspecified: Secondary | ICD-10-CM | POA: Diagnosis not present

## 2022-08-27 DIAGNOSIS — Z4889 Encounter for other specified surgical aftercare: Secondary | ICD-10-CM | POA: Diagnosis not present

## 2022-08-28 DIAGNOSIS — M25512 Pain in left shoulder: Secondary | ICD-10-CM | POA: Diagnosis not present

## 2022-08-29 DIAGNOSIS — M47816 Spondylosis without myelopathy or radiculopathy, lumbar region: Secondary | ICD-10-CM | POA: Diagnosis not present

## 2022-08-29 DIAGNOSIS — Z96651 Presence of right artificial knee joint: Secondary | ICD-10-CM | POA: Diagnosis not present

## 2022-08-29 DIAGNOSIS — G894 Chronic pain syndrome: Secondary | ICD-10-CM | POA: Diagnosis not present

## 2022-08-29 DIAGNOSIS — M1711 Unilateral primary osteoarthritis, right knee: Secondary | ICD-10-CM | POA: Diagnosis not present

## 2022-08-29 DIAGNOSIS — M25561 Pain in right knee: Secondary | ICD-10-CM | POA: Diagnosis not present

## 2022-08-29 DIAGNOSIS — E669 Obesity, unspecified: Secondary | ICD-10-CM | POA: Diagnosis not present

## 2022-08-29 DIAGNOSIS — Z79891 Long term (current) use of opiate analgesic: Secondary | ICD-10-CM | POA: Diagnosis not present

## 2022-08-29 DIAGNOSIS — Z1389 Encounter for screening for other disorder: Secondary | ICD-10-CM | POA: Diagnosis not present

## 2022-08-29 DIAGNOSIS — Z96652 Presence of left artificial knee joint: Secondary | ICD-10-CM | POA: Diagnosis not present

## 2022-08-29 DIAGNOSIS — M75102 Unspecified rotator cuff tear or rupture of left shoulder, not specified as traumatic: Secondary | ICD-10-CM | POA: Diagnosis not present

## 2022-09-02 DIAGNOSIS — M25512 Pain in left shoulder: Secondary | ICD-10-CM | POA: Diagnosis not present

## 2022-09-03 DIAGNOSIS — M47816 Spondylosis without myelopathy or radiculopathy, lumbar region: Secondary | ICD-10-CM | POA: Diagnosis not present

## 2022-09-09 DIAGNOSIS — D508 Other iron deficiency anemias: Secondary | ICD-10-CM | POA: Diagnosis not present

## 2022-09-09 DIAGNOSIS — Z6841 Body Mass Index (BMI) 40.0 and over, adult: Secondary | ICD-10-CM | POA: Diagnosis not present

## 2022-09-09 DIAGNOSIS — M25512 Pain in left shoulder: Secondary | ICD-10-CM | POA: Diagnosis not present

## 2022-09-12 DIAGNOSIS — M25512 Pain in left shoulder: Secondary | ICD-10-CM | POA: Diagnosis not present

## 2022-09-16 DIAGNOSIS — M25512 Pain in left shoulder: Secondary | ICD-10-CM | POA: Diagnosis not present

## 2022-09-19 DIAGNOSIS — M25512 Pain in left shoulder: Secondary | ICD-10-CM | POA: Diagnosis not present

## 2022-09-23 DIAGNOSIS — E559 Vitamin D deficiency, unspecified: Secondary | ICD-10-CM | POA: Diagnosis not present

## 2022-09-23 DIAGNOSIS — D508 Other iron deficiency anemias: Secondary | ICD-10-CM | POA: Diagnosis not present

## 2022-09-23 DIAGNOSIS — M25512 Pain in left shoulder: Secondary | ICD-10-CM | POA: Diagnosis not present

## 2022-09-23 DIAGNOSIS — Z6841 Body Mass Index (BMI) 40.0 and over, adult: Secondary | ICD-10-CM | POA: Diagnosis not present

## 2022-09-25 DIAGNOSIS — M25512 Pain in left shoulder: Secondary | ICD-10-CM | POA: Diagnosis not present

## 2022-09-26 DIAGNOSIS — M79605 Pain in left leg: Secondary | ICD-10-CM | POA: Diagnosis not present

## 2022-09-26 DIAGNOSIS — M545 Low back pain, unspecified: Secondary | ICD-10-CM | POA: Diagnosis not present

## 2022-09-29 DIAGNOSIS — M79605 Pain in left leg: Secondary | ICD-10-CM | POA: Diagnosis not present

## 2022-09-29 DIAGNOSIS — M5416 Radiculopathy, lumbar region: Secondary | ICD-10-CM | POA: Diagnosis not present

## 2022-09-30 DIAGNOSIS — R002 Palpitations: Secondary | ICD-10-CM | POA: Diagnosis not present

## 2022-09-30 DIAGNOSIS — R0600 Dyspnea, unspecified: Secondary | ICD-10-CM | POA: Diagnosis not present

## 2022-09-30 DIAGNOSIS — I420 Dilated cardiomyopathy: Secondary | ICD-10-CM | POA: Diagnosis not present

## 2022-09-30 DIAGNOSIS — I10 Essential (primary) hypertension: Secondary | ICD-10-CM | POA: Diagnosis not present

## 2022-10-01 DIAGNOSIS — M79605 Pain in left leg: Secondary | ICD-10-CM | POA: Diagnosis not present

## 2022-10-01 DIAGNOSIS — M5416 Radiculopathy, lumbar region: Secondary | ICD-10-CM | POA: Diagnosis not present

## 2022-10-02 DIAGNOSIS — Z1389 Encounter for screening for other disorder: Secondary | ICD-10-CM | POA: Diagnosis not present

## 2022-10-02 DIAGNOSIS — E669 Obesity, unspecified: Secondary | ICD-10-CM | POA: Diagnosis not present

## 2022-10-02 DIAGNOSIS — M1711 Unilateral primary osteoarthritis, right knee: Secondary | ICD-10-CM | POA: Diagnosis not present

## 2022-10-02 DIAGNOSIS — M75102 Unspecified rotator cuff tear or rupture of left shoulder, not specified as traumatic: Secondary | ICD-10-CM | POA: Diagnosis not present

## 2022-10-02 DIAGNOSIS — M47816 Spondylosis without myelopathy or radiculopathy, lumbar region: Secondary | ICD-10-CM | POA: Diagnosis not present

## 2022-10-02 DIAGNOSIS — Z79891 Long term (current) use of opiate analgesic: Secondary | ICD-10-CM | POA: Diagnosis not present

## 2022-10-02 DIAGNOSIS — Z96652 Presence of left artificial knee joint: Secondary | ICD-10-CM | POA: Diagnosis not present

## 2022-10-02 DIAGNOSIS — M25561 Pain in right knee: Secondary | ICD-10-CM | POA: Diagnosis not present

## 2022-10-02 DIAGNOSIS — Z96651 Presence of right artificial knee joint: Secondary | ICD-10-CM | POA: Diagnosis not present

## 2022-10-03 DIAGNOSIS — E559 Vitamin D deficiency, unspecified: Secondary | ICD-10-CM | POA: Diagnosis not present

## 2022-10-03 DIAGNOSIS — Z6841 Body Mass Index (BMI) 40.0 and over, adult: Secondary | ICD-10-CM | POA: Diagnosis not present

## 2022-10-04 DIAGNOSIS — M47816 Spondylosis without myelopathy or radiculopathy, lumbar region: Secondary | ICD-10-CM | POA: Diagnosis not present

## 2022-10-07 DIAGNOSIS — M79605 Pain in left leg: Secondary | ICD-10-CM | POA: Diagnosis not present

## 2022-10-07 DIAGNOSIS — M5416 Radiculopathy, lumbar region: Secondary | ICD-10-CM | POA: Diagnosis not present

## 2022-10-09 DIAGNOSIS — M79605 Pain in left leg: Secondary | ICD-10-CM | POA: Diagnosis not present

## 2022-10-09 DIAGNOSIS — M5416 Radiculopathy, lumbar region: Secondary | ICD-10-CM | POA: Diagnosis not present

## 2022-10-13 DIAGNOSIS — M79605 Pain in left leg: Secondary | ICD-10-CM | POA: Diagnosis not present

## 2022-10-13 DIAGNOSIS — F332 Major depressive disorder, recurrent severe without psychotic features: Secondary | ICD-10-CM | POA: Diagnosis not present

## 2022-10-13 DIAGNOSIS — F411 Generalized anxiety disorder: Secondary | ICD-10-CM | POA: Diagnosis not present

## 2022-10-13 DIAGNOSIS — R69 Illness, unspecified: Secondary | ICD-10-CM | POA: Diagnosis not present

## 2022-10-13 DIAGNOSIS — M5416 Radiculopathy, lumbar region: Secondary | ICD-10-CM | POA: Diagnosis not present

## 2022-10-14 DIAGNOSIS — M5416 Radiculopathy, lumbar region: Secondary | ICD-10-CM | POA: Diagnosis not present

## 2022-10-17 DIAGNOSIS — D508 Other iron deficiency anemias: Secondary | ICD-10-CM | POA: Diagnosis not present

## 2022-10-17 DIAGNOSIS — E559 Vitamin D deficiency, unspecified: Secondary | ICD-10-CM | POA: Diagnosis not present

## 2022-10-17 DIAGNOSIS — Z6841 Body Mass Index (BMI) 40.0 and over, adult: Secondary | ICD-10-CM | POA: Diagnosis not present

## 2022-10-20 DIAGNOSIS — M5416 Radiculopathy, lumbar region: Secondary | ICD-10-CM | POA: Diagnosis not present

## 2022-10-20 DIAGNOSIS — M79605 Pain in left leg: Secondary | ICD-10-CM | POA: Diagnosis not present

## 2022-10-28 ENCOUNTER — Ambulatory Visit: Payer: Medicare HMO | Admitting: Internal Medicine

## 2022-10-28 DIAGNOSIS — M545 Low back pain, unspecified: Secondary | ICD-10-CM | POA: Diagnosis not present

## 2022-10-28 DIAGNOSIS — M791 Myalgia, unspecified site: Secondary | ICD-10-CM | POA: Diagnosis not present

## 2022-10-29 ENCOUNTER — Ambulatory Visit (INDEPENDENT_AMBULATORY_CARE_PROVIDER_SITE_OTHER): Payer: Medicare HMO | Admitting: Internal Medicine

## 2022-10-29 ENCOUNTER — Encounter: Payer: Self-pay | Admitting: Internal Medicine

## 2022-10-29 VITALS — BP 108/80 | HR 108 | Temp 99.0°F | Ht 71.0 in | Wt 289.2 lb

## 2022-10-29 DIAGNOSIS — J8489 Other specified interstitial pulmonary diseases: Secondary | ICD-10-CM | POA: Diagnosis not present

## 2022-10-29 DIAGNOSIS — G8929 Other chronic pain: Secondary | ICD-10-CM

## 2022-10-29 DIAGNOSIS — M359 Systemic involvement of connective tissue, unspecified: Secondary | ICD-10-CM | POA: Diagnosis not present

## 2022-10-29 DIAGNOSIS — J849 Interstitial pulmonary disease, unspecified: Secondary | ICD-10-CM | POA: Diagnosis not present

## 2022-10-29 DIAGNOSIS — M545 Low back pain, unspecified: Secondary | ICD-10-CM | POA: Diagnosis not present

## 2022-10-29 NOTE — Addendum Note (Signed)
Addended by: Delrae Rend on: 10/29/2022 03:31 PM   Modules accepted: Orders

## 2022-10-29 NOTE — Patient Instructions (Addendum)
Interstitial lung disease due to connective tissue disease (HCC) High risk medication use Immunosuppressed status  STable CT March 2023 Clinically stable with symptoms and exercise hypoemia test 10/29/2022    Plan ' -Continue CellCept through the rheumatologist at [facility] -Continue Plaquenil through the rheumatologist at [facility] -Noted that you are off prednisone since 2021  -If pulmonary fibrosis gets worse then we can add nintedanib and consider you for research registry study called ILD-Pro -Get spirometry and DLCO in 6 months (cancel PFT 10/30/22 )   History of snoring Witnessed apneic spells OSA    - glad you are seeing Dr Val Eagle and are on CPAP  Plan  - per Dr Val Eagle   Low back pain  Plan   - get ok from your doctor and try stretch zone, stretch lab or stretch med  Follow-up -Spirometry and DLCO in 4  months -Return to see Dr. Marchelle Gearing 15-minute ILD slot in 4 months  - symptoms score and walk test in 4 months

## 2022-10-29 NOTE — Progress Notes (Addendum)
PCP [name],[name], PA-C Referred by Dr [name]  HPI   IOV 08/23/2014 His blood work and a CT scan in a few weeks and come back c Chief Complaint  Patient presents with   Pulmonary Consult    Pt referred by Dr. Corliss Skains for ILD. Pt c/o SOB with activity and rest, dry cough and chest tightness also with and without activity.    52 year old female referred for evaluation of autoimmune interstitial lung disease. She presents with her husband. In 2010 while living in [place] she reports she was diagnosed with lupus associated with rheumatoid arthritis in her joints. In 2012 she moved to live in [facility] and several months after that started noticing insidious onset of shortness of breath. Local rheumatologist diagnosed her with interstitial lung disease. She was referred to Dr. Herma Carson at [facility] in [facility] and was started on CellCept/prednisone for autoimmune interstitial lung disease he and however, sometime around 2 years ago she ran out of medical insurance and stopped taking these medications. During this time her dyspnea has progressed. It is currently rated as moderate to severe. It is present on exertion and relieved by rest. Even minimal amount of exertion makes her very ddyspneic. Now she has insurance and she did see Dr. Frederik Pear locally and she has autoimmune panel lab ordered. In addition exam revealed crackles and there for she's been referred here for reevaluation of interstitial lung disease and dyspnea. Dyspnea is associated with some chest tightness but no chest pain. This no associated dizzine   11/24/2014 Follow UP ILD  [name]  She has autoimmune ILD with RA/Lupus  She was seen 6 weeks ago, restarted on Cellcept.  Previously on cellcept but lost her insurance until recently.  On low dose prednisone 5mg  daily .  Last CT chest 4/4 showed ILD changes similar to 2013. Echo was ok with EFG 55-60%, nml PAP . In  March .   Did not take dapsone ,due to  GP6D deficiency.  Labs ok last week with nml LFT , no sign change in hbg. /wbc.  She is feeling better. Does feel her breathing is some better.  No flare of cough or wheezing.     OV 01/16/2015  Chief Complaint  Patient presents with   Follow-up    Pt c/o DOE, mild dry cough, and chest tightness when SOB. Pt states the chest tightness has improved).    follow-up interstitial lung disease in the setting of rheumatoid arthritis Follow-up high risk medication use - on CellCept and prednisone since mid April 2016  - Presents with her husband. Both give a history. Overall she is doing better in terms of dyspnea after starting CellCept and prednisone. However the improvement this only moderate. She still left with a residual moderate dyspnea on exertion that is also made worse with bending or heart air and improved with rest and cool air. It is associated with some cough and wheezing. She takes albuterol inhaler which she feels helps only somewhat but not fully and not quickly enough. She is frustrated by this. In addition she's complaining of some associated right lower back paraspinal spasm for which massage gives her relief. He has never attended pulmonary rehabilitation.  - Lab review shows she has had problems with potassium and has had potassium supplementation. Last lab check was 12/08/2014. She is due for lab test right now   OV 03/28/2015   Chief Complaint  Patient presents  with   Follow-up    3 month follow up. Pt states that she is still having some problems with her breathing. Pt c/o of feeling chest tightness, chest pain and cough that is dry. Pt denies wheeze. Pt states that she did trial the Advair and does feel that it helped some.   Follow-up interstitial lung disease secondary to autoimmune process and associated dyspnea that seems out of proportion\  She presents with her husband. She continues on CellCept and prednisone. In the last  visit approximately 2-3 months ago she had out of proportion dyspnea. I gave her some Advair to try. She says this only helped a little bit. Overall she says that dyspnea still persists. It is worse than in the spring 2016 when she started CellCept and prednisone. It is stable since July 2016. It is moderate in intensity. Occurs randomly at rest but also with exertion. Occasionally relieved by rest but also happens at rest. Heat and humidity make it worse. Advair helped a little bit only. This no associated chest tightness or wheezing. She did try and enroll  in pulmonary rehabilitation but could not afford the the startup program and is waiting to hear from them for the maintenance program. She is frustrated by all the symptoms.  Pulmonary function test October 15,016 today FVC 2.55 L/64%, total lung capacity 4 L/65% and diffusion 14.5/42%. Overall no change since April 2016   OV 07/04/2015  Chief Complaint  Patient presents with   Follow-up    pt. states breathing is baseline. SOB. dry cough. wheezing. occ. chest pain/tightness. feels her airway is blocked.   Follow-up interstitial lung disease admitted autoimmune processes and associated out of proportion dyspnea  She presents again with her husband. She continues on CellCept 2 g twice daily associated with prednisone. She cannot take Bactrim or dapsone due to G6PD deficiency. Last seen in the fall of 2016. She was having out of proportion dyspnea. Rated cardia pulmonary stress test and she could not tolerate this test. She then underwent right heart catheterization mid-November 2016 with Dr. Marca Ancona. Review the tests this is normal. Overall stable but she and husband still continued to complain about this resting dyspnea associated with wheezing. They hear noises in the chest. Sometime she gasps for air even at rest. She feels it comes from the chest but the husband points to the throat. I offered second opinion at [facility] because of  this unusual symptoms but they declined citing distance. She was supposed to attend pulmonary rehabilitation but they cannot afford a $60 co-pay twice a week 8 weeks. Offered ENT evaluation that agreeable but they wanted done in [place] which is closer logistically. In addition patient is contemplating now applying for disability. 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  Walking desaturation test 185 feet 3 laps and rheumatic: No desaturation    OV 10/12/2015  Chief Complaint  Patient presents with   Follow-up    PFT today.  breathing is better.  Pt is currently on STD, [employer] approved STD until today, wants to know today if pt is able to return to work, any restrictions.  Pt states that she has been more stable while out of work.      Follow-up interstitial lung disease due to autoimmune processes not otherwise specified  Last seen January 2017. Since then she has been on short-term disability and out of work. She does heavy manual work. The lack of work estimated dyspnea better. She did pulmonary  function test today that I personally visualized and overall it is same. There are no new issues. She is on CellCept and she is tolerating this fine. Last visit she had some anemia we follow this up and the anemia was better. Also hypokalemia resolved by February 2017. She is due for blood work today. 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 This showed up at last visit. She saw an ENT in [place] within referred her to [facility]. She does not want to go to [facility] She ended up seeing Dr. Ezzard Standing locally. But now she is going to see Dr.  Dina Rich [facility]   Pulmonary function test today 10/12/2015 shows postbronchodilator FVC 2.65 L/67%. FEV1 2.34 L/72% which is up 17% positive bronchodilator response. Ratio of 80/106%. Total lung capacity of 3.8/62%. DLCO 18.36/52%.  Overall consistent with restriction and lung parenchymal disease. Overall PFTs are stable compared to April 2016 but perhaps in DLCO slightly better   OV 04/15/2016  Chief Complaint  Patient presents with   Interstitial Lung Disease    Breathing is unchanged since last OV. Reports SOB, coughing. Cough is non productive. Denies chest tightness or wheezing.   Follow-up interstitial lung disease due to autoimmune processes not otherwise specified. On CellCept and prednisone. Not on Bactrim prophylaxis due to G6PD deficiency  Last visit April 2017. At that time based on pulmonary function tests showed stability and ILD for a year. 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 She did see local ENT doctor [place] and apparently has been reassured. At this point in time she is applying for long-term disability at work but the work discharged from services and she is now applying for Social Security disability. Her dyspnea stable since the interim. She's also had hysterectomy but for the last few weeks her cough is worse and it is dry. This no fever. Is no weight loss or chills. She says she's compliant with her CellCept and prednisone.  OV 05/29/2016  Chief Complaint  Patient presents with   Follow-up    Pt states she still has harsh dry cough, pt states her SOB is unchanged and chest tightness when SOB or when coughing a lot. Pt deneis f/c/s.     Follow-up interstitial lung disease due to autoimmune processes not otherwise specified. On CellCept and prednisone since April 2016. Not on Bactrim prophylaxis due to G6PD deficiency  Shannon [name] returns for follow-up. She had high resolution CT chest that shows stability and interstitial lung disease since April 2016. She Pulmicort function test that shows mild improvement since April 2016. Therefore it appears that her CellCept and prednisone is helping her she'll lung disease related to collagen-vascular disease. However she  tells me that she continues to have chronic cough for the last few to several months. It is progressive. It is dry. It is worse than her baseline. The dyspnea is unchanged. This no fever or weight loss or chills   OV 07/24/2016  Chief Complaint  Patient presents with   Follow-up    Pt states she feels the Arnuity has been helping her breathing. Pt c/o dry cough and occ chest tightness.   rec    ICD-9-CM ICD-10-CM   1. ILD (interstitial lung disease) (HCC) 515 J84.9   2. Chronic cough 786.2 R05   3. High risk medication use V58.69 Z79.899    Clinically improved with  cellcept/prednisone and arnuity Blood work ok dec 2017  pLAN - start pulm rehab at [facility] - continue  aruity daily - continue cellcept and prednisone  Followup - 6 months do Pre-bd spiro and dlco only. No lung volume or bd response. No post-bd spiro - 6 months fu Dr Marchelle Gearing after above or sooner if needed     12/15/2016  f/u ov/Wert re: cough dry on arnuity  Chief Complaint  Patient presents with   Follow-up    Breathing is unchanged since her last visit. Pt states she is here to f/u on recent ABG result. She c/o feeling tired all of the time. She has occ cough- non prod.       Finished at Rehab   beginning  Of May  2018 and trouble walking fast or uphill Waking up with ha's since rehab sev days a week  Cough is new x one month mostly dry and day > noct   No obvious day to day or daytime variability or assoc excess/ purulent sputum or mucus plugs or hemoptysis or cp or chest tightness, subjective wheeze or overt sinus or hb symptoms. No unusual exp hx or h/o childhood pna/ asthma or knowledge of premature birth.   OV 02/13/2017  Chief Complaint  Patient presents with   Follow-up    cough/ILD follow up - prod cough with brownish/green mucus with tightness and chest pain x2 days.  denies any f/c/s, hemoptysis.  PFT done today.    Follow-up interstitial lung disease due to autoimmune processes not  otherwise specified. On CellCept and prednisone since April 2016. Not on Bactrim prophylaxis due to G6PD deficiency  52 year old female immunosuppressed with CellCept and prednisone. Not seen her in many months. Currently taking prednisone 3 mg per day and CellCept 1000 mg twice daily. She cannot do Bactrim prophylaxis because of G6PD deficiency. She tells me that overall her health has been stable but in the last few weeks noticing increased shortness of breath in the last few days this increased cough and change in color of sputum to green and increased chest tightness and a feeling that she is getting acute bronchitis. She also was contemplated going to the emergency room a few days ago but now she is better. There is no obvious fever or chills or hemoptysis or edema paroxysmal nocturnal dyspnea or orthopnea. Pulmonary function test today shows 10% decline in FVC and DLCO compared to baseline.   OV 06/08/2017  Chief Complaint  Patient presents with   Follow-up    Feeling about the same as last visit. Still having chest tightness and wheezing at times, Sounds hoarse.     Follow-up interstitial lung disease due to autoimmune processes not otherwise specified. On CellCept and prednisone since April 2016. Not on Bactrim prophylaxis due to G6PD deficiency. Normal Right heart cath Nov 2016.  Last high-resolution CT November 2017  Last visit August 2018.  There is a routine follow-up.  Overall she feels stable.  FVC shows stability since August 2018 but declined since 1 year ago.  DLCO shows continued decline.  Though her lung health is stable.  She says she lost her 1 only biological sister last week due to lupus.  The sister was only 69 and lived in [place].  There are no new issues. Walking desaturation test on 06/08/2017 185 feet x 3 laps on ROOM AIR:  did not desaturate. Rest pulse ox was 100%%, final pulse ox was 100%. HR response 81/min at rest to 109/min at peak  exertion. Patient Shannon [name]  Did not Desaturate < 88% . Shannon [name] did nto  Desaturated </= 3% points. Shannon [name] yes did get tachyardic   OV 10/15/2017   Follow-up interstitial lung disease due to autoimmune processes not otherwise specified. On CellCept and prednisone since April 2016. Not on Bactrim prophylaxis due to G6PD deficiency. Normal Right heart cath Nov 2016 and feb 2019.  Last high-resolution CT November 2017 and dec 2018 without progresion   Last visit December 2018.  This is a routine follow-up.  In the interim she had high-resolution CT scan of the chest that did not show any progression in interstitial lung disease between 2017 and 2018.Marland Kitchen  Therefore we did an echocardiogram that showed slight elevation in pulmonary artery systolic pressure.  Therefore we sent it to her repeat right heart catheterization and this was normal as documented below.  Overall she feels stable compared to one year ago but has declined compared to 2 years ago.  She is also complaining about a new recurrence of cough that happens mostly at night despite Symbicort and prednisone and CellCept.  It wakes her up.  It is moderate in intensity.  There is no associated wheezing or edema orthopnea.  It happens randomly.  There is associated heartburn.  She is on nothing for acid reflux.       Right Heart Pressures 08/20/17 [facility] Procedural Findings: Hemodynamics (mmHg) RA mean 3 RV 30/6 PA 23/8, mean 14 PCWP mean 8  Oxygen saturations: PA 72% AO 98%  Cardiac Output (Fick) 6.73  Cardiac Index (Fick) 3.06  Cardiac Output (Thermo) 6.94 Cardiac Index (Thermo) 3.15    OV 12/01/2017  Chief Complaint  Patient presents with   Follow-up    Pt has SOB with exertion, wheezing, some chest tightness. Pt was dry cough more at bedtime.      Follow-up interstitial lung disease due to autoimmune processes not otherwise specified. On CellCept and prednisone since April 2016. Not on Bactrim prophylaxis due to G6PD deficiency.  Normal Right heart cath Nov 2016 and feb 2019.  Last high-resolution CT November 2017 and dec 2018 without progresion   This visit is to see if she is got progressive interstitial lung disease.  Last visit she was complaining of more cough.  Therefore we added some acid reflux treatment.  She says after the acid reflux treatment symptoms have actually improved.  This contradicts what she told the CMA at intake.  Overall she is feeling stable.  However she does have a new episode of orthostatic dizziness going on at a mild level for the last 1 week.  Today after doing pulmonary function test she did feel dizzy.  Therefore we checked orthostatics on her and she got tachycardic standing up and her blood pressure did drop although still within normal limits.  She does not have any chest pain.  Lung function test shows continued stability in the last 1 year including compared to the most recent attempt.  OV 03/08/2018  Subjective:  Patient ID: Shannon [name], female , DOB: 1971-03-04 , age 17 y.o. , MRN: [id number] , ADDRESS: [address]  [facility] [address]   03/08/2018 -   Chief Complaint  Patient presents with   Follow-up    Spirometry performed today. Pt states she is still having some mild problems with dizziness but not as bad as last visit. Pt also states she has been having problems with headaches x1 month. Pt states her breathing is  about the same as last visit and also states she still has the dry cough.    Follow-up interstitial lung disease due to autoimmune processes not otherwise specified. On CellCept and prednisone since April 2016. Not on Bactrim prophylaxis due to G6PD deficiency. Normal Right heart cath Nov 2016 and feb 2019.  Last high-resolution CT November 2017 and dec 2018 without progresion     HPI Shannon [name] 52 y.o. - connective tissue disease ILD. Follow-up. Last seen June 2019 by she started having new onset dizziness. It seemed orthostatic. She says since then it is  spontaneously improved but still persists. She is also dealing with fatigue issues. In 02/26/2018 she felt she had a lupus flare saw Dr. Algis Downs and given a prednisone burst. Autoimmune profile at that time showed continued positive ANA titer but normal complements. She had a CBC that showed a drop in hemoglobin by 1 g percent. Her baseline is around 11.5 g percent and currently it is around 10.5 g percent. She says after the prednisone burst a lupus flare symptoms and fatigue have improved although it still persists. If it is actually worse in the last few months. She had pulmonary function test that shows continued improvement especially following the recent prednisone burst. Walking desaturation test was normal other than tachycardia. We observed her to be walking very slowly in a very deconditioned and fatigued fashion. 02/26/2089 and liver function and renal function reviewed and this was normal. Medication review shows she is on oxycodone and gabapentin for back pain. She says she's been on gabapentin for the last 1 year. It appears that the temporal sequence of fatigue and dizziness might be related to gabapentin but she is not so sure.       Results for KAETLIN, BULLEN (MRN [id number]) as of 03/08/2018 10:51  Ref. Range 02/26/2018 10:53  Hemoglobin Latest Ref Range: 11.7 - 15.5 g/dL 14.7 (L)  Results for ZANNA, HAWN (MRN [id number]) as of 03/08/2018 10:51  Ref. Range 02/26/2018 10:53  Creatinine Latest Ref Range: 0.50 - 1.10 mg/dL 8.65  Results for SANIA, NOY (MRN [id number]) as of 03/08/2018 10:51  Ref. Range 02/26/2018 10:53  AST Latest Ref Range: 10 - 35 U/L 19  ALT Latest Ref Range: 6 - 29 U/L 18   Results for AILANI, GOVERNALE (MRN [id number]) as of 03/08/2018 10:51  Ref. Range 02/26/2018 10:53  Anti Nuclear Antibody(ANA) Latest Ref Range: NEGATIVE  POSITIVE (A)  ANA Pattern 1 Unknown SPECKLED (A)  ANA Titer 1 Latest Units: titer > OR = 1:1280 (A)  ds DNA Ab Latest Units: IU/mL <1  C3  Complement Latest Ref Range: 83 - 193 mg/dL 102  C4 Complement Latest Ref Range: 15 - 57 mg/dL 33    ROS - per HPI    OV 06/03/2018  Subjective:  Patient ID: Shannon [name], female , DOB: 11/18/1970 , age 69 y.o. , MRN: [id number] , ADDRESS: [address]  [place] [facility] [id number]   06/03/2018 -   Chief Complaint  Patient presents with   Follow-up    pt states breathing is baseline. c/o sob with exertion, non prod cough & wheezing    Follow-up interstitial lung disease due to autoimmune processes not otherwise specified. On CellCept and prednisone since April 2016. Not on Bactrim prophylaxis due to G6PD deficiency. Normal Right heart cath Nov 2016 and feb 2019.  Last high-resolution CT November 2017 and dec 2018 without progresion   HPI Shannon [name] 52 y.o. -presents  for follow-up.  Last visit September 2019.  At that time CBC showed anemia.  We repeated the hemoglobin 1 month later and was stable around 10 g%.  She tells me that overall she is stable.  Last visit she had dizziness and I told her to stop gabapentin which she did and her dizziness and fatigue have improved.  She feels she is stable but in the last 3 days she has had a dry cough but no fever or congestion or wheezing or hemoptysis or edema.  No worsening shortness of breath or chest tightness.  Other than that she feels good.  She did up walking desaturation test today and it appears similar to previous visit.       OV 05/13/2019  Subjective:  Patient ID: Shannon [name], female , DOB: June 22, 1971 , age 66 y.o. , MRN: [id number] , ADDRESS: [address] [place] [address]   05/13/2019 -   Chief Complaint  Patient presents with   Follow-up    Pt just had total left knee replacement mid October 2020. Pt said she still has problems with SOB especially with going upstairs. Pt also has occ wheezing but denies any real complaints of cough.   Follow-up interstitial lung disease due to autoimmune processes not  otherwise specified. On CellCept and prednisone since April 2016. Not on Bactrim prophylaxis due to G6PD deficiency. Normal Right heart cath Nov 2016 and feb 2019.  Last high-resolution CT November 2017 and dec 2018 without progression. Last PFT Sept 2019  HPI LILYMARIE SCROGGINS 52 y.o. -returns for follow-up.  Last seen in September 2019 and because of the pandemic things got delayed.  She was supposed to see me in 6 months with spirometry and high-resolution CT chest for progression but this did not happen.  At this follow-up we do not have those data points as yet.  She tells me the interim she has had knee surgery and therefore she is not able to do a simple walk test with [place].  She has gained some weight.  Also in the interim she is now on Social Security disability and she is unemployed now.  She feels her shortness of breath is slightly worse particularly when walking up stairs but otherwise overall she feels stable.  I reviewed her blood work.  And it seems anemia is worse in the 9 g%.  She really denies any bleeding.  Normally she runs between 10 and 11 g%.  I noticed that she still not had a shingles vaccine.   OV 01/09/2020   Subjective:  Patient ID: Shannon [name], female , DOB: 11-23-1970, age 29 y.o. years. , MRN: [id number],  ADDRESS: [address] [place] [address] PCP  Eunice Blase, PA-C Providers : Treatment Team:  Attending Provider: Kalman Shan, MD    Follow-up interstitial lung disease due to autoimmune processes not otherwise specified. On CellCept and prednisone since April 2016. Not on Bactrim prophylaxis due to G6PD deficiency. Normal Right heart cath Nov 2016 and feb 2019.  Last high-resolution CT November 2017 and dec 2018 and dec 2020 without progression. Last PFT Sept 2019 and now June 2021  Chief Complaint  Patient presents with   Follow-up       HPI Shannon [name] 52 y.o. -returns for follow-up.  Since her last visit she continues to be stable.   She is only on CellCept.  She is also on Plaquenil.  She is not on prednisone or antifibrotic's.  Her dyspnea is stable.  Her pulmonary function test is improved and a CT chest shows stability.  The main issue is that she says over the last 3 years she has gained 30 to 40 pounds of weight.  She says that she is snoring a lot according to her husband.  Apparently the husband's also noticed apneic spells.  She says she has had the Covid vaccine but is not documented in our chart.  She is yet to have the Shingrix vaccine.       Results for LILIAS, LORENSEN (MRN [id number]) as of 02/13/2017 12:16  Ref. Range 10/09/2014 09:39 03/28/2015 11:40 10/12/2015 13:42 05/06/2016 10:38 02/13/2017 10:49 06/08/2017  12/01/2017  03/08/2018  11/24/19  FVC-Pre Latest Units: L 2.54 2.55 2.53 2.63 2.32 2.31 2.38 2.46 2.67  FVC-%Pred-Pre Latest Units: % 64 64 64 67 59 59% 61% 67% 73%   Results for JAKYAH, BRADBY (MRN [id number]) as of 02/13/2017 12:16  Ref. Range 10/09/2014 09:39 03/28/2015 11:40 10/12/2015 13:42 05/06/2016 10:38 02/13/2017 10:49 06/08/2017  12/01/2017  03/08/2018  11/24/19  DLCO unc Latest Units: ml/min/mmHg 15.03 14.50 18.36 18.31 16.35 14.21 16.38 x 18.56  DLCO unc % pred Latest Units: % 43 42 53 53 47 41 47% x 72%    ROS - per HPI Results for RAYLEI, LOSURDO (MRN [id number]) as of 01/09/2020 16:33  Ref. Range 12/21/2019 22:12  Creatinine Latest Ref Range: 0.44 - 1.00 mg/dL 6.80  Results for NATAJAH, DERDERIAN (MRN [id number]) as of 01/09/2020 16:33  Ref. Range 12/21/2019 22:12  Hemoglobin Latest Ref Range: 12.0 - 15.0 g/dL 00.3 (L)   IMPRESSION: dec 2020 compared to 2018 1. The appearance of the lungs remains compatible with interstitial lung disease, with a spectrum of findings considered indeterminate for usual interstitial pneumonia (UIP) per current ats guidelines. However, given the spectrum of findings and the stability of the findings compared to the prior study, this is once again strongly favored to  represent nonspecific interstitial pneumonia (NSIP).     Electronically Signed   By: Trudie Reed M.D.   On: 06/15/2019 09:42 ROS - per HPI    OV 07/27/2020  Subjective:  Patient ID: Shannon [name], female , DOB: Dec 29, 1970 , age 55 y.o. , MRN: [id number] , ADDRESS: [address] [place] [address] PCP Eunice Blase, PA-C Patient Care Team: Otila Back as PCP - General (Internal Medicine)  This Provider for this visit: Treatment Team:  Attending Provider: Kalman Shan, MD    07/27/2020 -   Chief Complaint  Patient presents with   Follow-up    Get pft results.   Follow-up interstitial lung disease due to autoimmune processes not otherwise specified. -Lupus per [facility] clinic notes in December 2021.   - On CellCept and prednisone since April 2016.  Off prednisone in 2021  Not on Bactrim prophylaxis due to G6PD deficiency.    - Normal Right heart cath Nov 2016 and feb 2019  -  Last high-resolution CT November 2017 and dec 2018 and dec 2020 without progression.    - Last PFT Sept 2019 and now June 2021 and feb 2022  Chief Complaint  Patient presents with   Follow-up      HPI CHANTELE CORADO 52 y.o. -returns for routine ILD follow-up.  Last seen in July 2021.  Clinically she is stable.  Since last seeing me November 2020 when she had right knee surgery.  She uses a cane.  She is also seen Dr.  Olin in sleep clinic.  She is now on CPAP and it is helping her.  Her weight gain persists.  Her mobility has therefore gone down but from a dyspnea standpoint she is stable.  She is up-to-date with her COVID vaccine.  With the booster she did get symptomatic with Sirs response.  She continues on CellCept.  She is now seeing [facility] rheumatology.  She does not see [facility] anymore.  She believes she has lupus.  She has an appointment upcoming in a few days.  Review of the records at [facility] indicate t that she has lupus.  She continues to  be off prednisone.  She is unable to do a walk test today because of her knee issues and also she is on a cane.  She has pulmonary function test there is a 2-3.8% decline in FVC/DLCO.  This could easily be because of weight gain.  Her symptom scores are stable.  She did have CT angiogram in the summer 2021 this ruled out pulmonary embolism.  There is no comment about ILD.  But then it is a contrast CT.    OV 03/05/2021  Subjective:  Patient ID: Shannon [name], female , DOB: 03/29/1971 , age 81 y.o. , MRN: [id number] , ADDRESS: [address] [place] [address] PCP Eunice Blase, PA-C Patient Care Team: Otila Back as PCP - General (Internal Medicine)  This Provider for this visit: Treatment Team:  Attending Provider: Kalman Shan, MD    03/05/2021 -   Chief Complaint  Patient presents with   Follow-up    PFT performed today.  Pt states she has been doing okay since last visit and states that her breathing is about the same.   Follow-up interstitial lung disease due to autoimmune processes not otherwise specified. -Lupus per [facility] clinic notes in December 2021.   - On CellCept and prednisone since April 2016.  Off prednisone in 2021  Not on Bactrim prophylaxis due to G6PD deficiency.    - Normal Right heart cath Nov 2016 and feb 2019  -  Last high-resolution CT November 2017 and dec 2018 and dec 2020 without progression. - > July 2021 CTA   - Last PFT Sept 2019 and now June 2021 and feb 2022 and sept 2022  HPI Shannon [name] 52 y.o. -returns for follow-up.  She continues on her immunosuppressive regimen through [facility] rheumatology program.  She is happy with the care there.  She is not on prednisone.  She uses CPAP through Dr. Val Eagle.  Overall she is feeling stable.  Symptom scores are stable.  She had pulmonary function test and it shows that her FVC is stable but DLCO is declined.  This correlates with a hemoglobin around 10 last done in June 2022 at  [facility].  But then she is also chronically anemic.  She is definitely not noticing any worsening in her shortness of breath.  Her walking desaturation test is also stable.  She wants her flu shot today.        PFT  OV 03/18/2022  Subjective:  Patient ID: Shannon [name], female , DOB: 07/03/1970 , age 89 y.o. , MRN: [id number] , ADDRESS: [address] [place] [address] PCP Eunice Blase, PA-C Patient Care Team: Otila Back as PCP - General (Internal Medicine)  This Provider for this visit: Treatment Team:  Attending Provider: Kalman Shan, MD    03/18/2022 -   Chief Complaint  Patient presents with   Follow-up    Pt states she is better compared to last visit and states the cough is better.   HPI Shannon [name] 52 y.o. -returns for follow-up.  I last saw her 1 year ago.  She continues on CellCept and Plaquenil through [facility].  Shortness of breath is stable.  She had high-resolution CT chest in March 2023 and that is without progression.  She has no other new issues.  She did in the interim tear her rotator cuff on the left side and had surgery.  She is having some sciatic problems.  She will have the flu shot today.  She follows with Dr. Val Eagle for sleep apnea.    CT Chest data HRCT March 2023  IMPRESSION: Basilar and subpleural predominant ground-glass and septal thickening with air trapping, similar to 12/22/2019. Assessment is somewhat limited by respiratory motion and body habitus. Nonspecific interstitial pneumonitis is favored. Findings are indeterminate for UIP per consensus guidelines: Diagnosis of Idiopathic Pulmonary Fibrosis: An Official ATS/ERS/JRS/ALAT Clinical Practice Guideline. Am Rosezetta Schlatter Crit Care Med Vol 198, Iss 5, [phone], Feb 21 2017.     Electronically Signed   By: Leanna Battles M.D.   On: 08/28/2021 11:12    No results found.     OV 10/29/2022  Subjective:  Patient ID: Shannon [name], female , DOB:  06-04-1971 , age 36 y.o. , MRN: [id number] , ADDRESS: [address] [place] [address] PCP Eunice Blase, PA-C Patient Care Team: Otila Back as PCP - General (Internal Medicine)  This Provider for this visit: Treatment Team:  Attending Provider: Kalman Shan, MD   Follow-up interstitial lung disease due to autoimmune processes not otherwise specified. -Lupus per [facility] clinic notes in December 2021.   - On CellCept  - s/p Prednisone since April 2016 - Off prednisone in 2021   - Not on Bactrim prophylaxis due to G6PD deficiency.    - Normal Right heart cath Nov 2016 and feb 2019  -  Last high-resolution CT November 2017 and dec 2018 and dec 2020 without progression. - > July 2021 CTA -. HRCT March 2023   - Last PFT Ssept 2022  10/29/2022 -   Chief Complaint  Patient presents with   Follow-up    No complaints.  PFT tomorrow.     HPI Shannon [name] 52 y.o. -returns for follow-up of her ILD due to lupus.  She continues to be stable.  She is supposed a pulmonary function test but she got the visit scheduled today and the pulmonary function schedule tomorrow 10/30/2022.  Nevertheless she feels stable.  She continues on CellCept through [facility] rheumatology.  She has no new issues.  Her main complaint is that she has low backache because of degenerative disc disease in the L4-L5 and L5-S1 areas.  She send procedure is being considered.  I have asked her to consider professional stretching through several, she will stretch facilities that are available.  The commercial facilities available in the local area stretch zone, stretch med and stretch lab.  Asked her to get clearance from her doctor.  She is willing to try this.  I did indicate to her that it could be a out-of-pocket cost.  There is clinical research evidence showing stretching to reduce pain and improve mobility.      SYMPTOM SCALE - ILD 05/13/2019  07/27/2020  03/05/2021  03/18/2022  10/29/2022   O2 use RA ra  ra ra ra  Shortness of Breath 0 -> 5 scale with 5 being worst (score 6 If unable to do)      At rest 2 1 1 2 3   Simple tasks - showers, clothes change, eating, shaving 3 2 2 3 2   Household (dishes, doing bed, laundry) 3 3 4 3 3   Shopping 3 4 3  Did not ansewr No aswer  Walking level at own pace 2 3 3 4 2   Walking up Stairs 5 5 3 4 1   Total (40 - 48) Dyspnea Score 29 18 16 16 11   How bad is your cough? 0 2 0 0 2  How bad is your fatigue 3 0 3 2 0  nausea  0 0 0 0  vomit  0 0 0 0  diarrhea  0 0 0 4  anxiety  4 4 4 3   depression  4  3 z          Simple office walk 185 feet x  3 laps goal with forehead probe 03/08/2018  06/03/2018  T.d  10/29/2022   O2 used Room air Room iar ra ra  Number laps completed 3 3 x 250 feet  Sit/stand test times 10 reps  Comments about pace Slow pace normal nl   Resting Pulse Ox/HR 100% and 96/min 100% and 76 100% and 98/min And 98% and heart rate of 101  Final Pulse Ox/HR 100% and 112/min 96% and 121 97% and 135/min 96/98% with heart rate of 115  Desaturated </= 88% n no no no  Desaturated <= 3% points no Yes, 4 points Yes, 3 no  Got Tachycardic >/= 90/min yes yes yes yes  Symptoms at end of test Mild fatigue Mild dyspnea mild Level 6 out of 10 dyspnea  Miscellaneous comments Very slow pace  Avg pace Steady pace   Immunization History  Administered Date(s) Administered   Influenza Split 03/23/2014   Influenza,inj,Quad PF,6+ Mos 03/15/2016, 06/08/2017, 03/08/2018, 02/17/2019, 03/05/2021, 03/18/2022   Influenza-Unspecified 04/03/2020   Moderna Sars-Covid-2 Vaccination 09/20/2019, 10/06/2019   PFIZER Comirnaty(Gray Top)Covid-19 Tri-Sucrose Vaccine 09/13/2019, 10/06/2019, 11/05/2019, 05/30/2020   PFIZER(Purple Top)SARS-COV-2 Vaccination 10/06/2019, 11/05/2019   Pfizer Covid-19 Vaccine Bivalent Booster 41yrs & up 04/03/2021   Pneumococcal-Unspecified 08/23/2014     PFT     Latest Ref Rng & Units 03/05/2021    2:58 PM 07/27/2020    8:55 AM  11/24/2019   11:04 AM 03/08/2018   10:35 AM 12/01/2017    2:12 PM 06/08/2017    3:13 PM 02/13/2017   10:49 AM  PFT Results  FVC-Pre L 2.64  2.59  2.67  2.46  2.38  2.31  2.32   FVC-Predicted Pre % 73  72  73  67  61  59  59   Pre FEV1/FVC % % 85  88  83  84  83  81  84   FEV1-Pre L 2.24  2.28  2.21  2.07  1.98  1.87  1.94   FEV1-Predicted Pre % 77  78  75  70  62  59  61   DLCO uncorrected ml/min/mmHg 15.34  19.54  18.56   16.38  14.21  16.35   DLCO UNC% % 60  76  72   47  41  47   DLCO corrected ml/min/mmHg 15.34  19.54  20.32    14.89  15.02   DLCO COR %Predicted % 60  76  79    43  43   DLVA Predicted % 97  117  122   78  78  80        has a past medical history of Anemia, Anxiety, Depression, Dyspnea, G6PD deficiency, GERD (gastroesophageal reflux disease), Hypertension, ILD (interstitial lung disease) (HCC), Lupus (HCC), Pneumonia, Rheumatoid arthritis (HCC), and Sleep apnea.   reports that she quit smoking about 8 years ago. Her smoking use included cigarettes. She has a 7.00 pack-year smoking history. She has never used smokeless tobacco.  Past Surgical History:  Procedure Laterality Date   ABDOMINAL HYSTERECTOMY     APPENDECTOMY  1980   cardiac catherization  08/20/2017   CARDIAC CATHETERIZATION N/A 05/10/2015   Procedure: Right Heart Cath;  Surgeon: Laurey Morale, MD;  Location: [facility] INVASIVE CV LAB;  Service: Cardiovascular;  Laterality: N/A;   CESAREAN SECTION  '95, '02, '07   X 3   CHOLECYSTECTOMY  2010   LAPAROSCOPIC HYSTERECTOMY  10/2015   have ovaries   RIGHT HEART CATH N/A 08/20/2017   Procedure: RIGHT HEART CATH;  Surgeon: Laurey Morale, MD;  Location: [facility] INVASIVE CV LAB;  Service: Cardiovascular;  Laterality: N/A;   SHOULDER ARTHROSCOPY WITH ROTATOR CUFF REPAIR AND SUBACROMIAL DECOMPRESSION Left 08/13/2022   Procedure: SHOULDER ARTHROSCOPY WITH SUBACROMIAL DECOMPRESSION, DEBRIDEMENT AND PARTIAL ROTATOR CUFF REPAIR;  Surgeon: Jene Every, MD;  Location: [facility] ORS;   Service: Orthopedics;  Laterality: Left;   SHOULDER ARTHROSCOPY WITH SUBACROMIAL DECOMPRESSION Right 08/28/2021   Procedure: SHOULDER ARTHROSCOPY WITH SUBACROMIAL DECOMPRESSION AND DEBRIDEMENT;  Surgeon: Jene Every, MD;  Location: [facility];  Service: Orthopedics;  Laterality: Right;   TOTAL KNEE ARTHROPLASTY Left 04/04/2019   Procedure: TOTAL KNEE ARTHROPLASTY;  Surgeon: Ollen Gross, MD;  Location: [facility];  Service: Orthopedics;  Laterality: Left;    TOTAL KNEE ARTHROPLASTY Right 04/30/2020   Procedure: TOTAL KNEE ARTHROPLASTY;  Surgeon: Ollen Gross, MD;  Location: [facility];  Service: Orthopedics;  Laterality: Right;    TUBAL LIGATION      Allergies  Allergen Reactions   Asa [Aspirin] Hives and Shortness Of Breath    Chest tightness    Penicillins Hives    Has patient had a PCN reaction causing immediate rash, facial/tongue/throat swelling, SOB or lightheadedness with hypotension: Unknown Has patient had a PCN reaction causing severe rash involving mucus membranes or skin necrosis: Unknown Has patient had a PCN reaction that required hospitalization: Unknown Has patient had a PCN reaction occurring within the last 10 years: No If all of the above answers are "NO", then may proceed with Cephalosporin use.  Tolerated Cephalosporin Date: 05/01/20.     Sulfa Antibiotics     G6PD deficiency     Immunization History  Administered Date(s) Administered   Influenza Split 03/23/2014   Influenza,inj,Quad PF,6+ Mos 03/15/2016, 06/08/2017, 03/08/2018, 02/17/2019, 03/05/2021, 03/18/2022   Influenza-Unspecified 04/03/2020   Moderna Sars-Covid-2 Vaccination 09/20/2019, 10/06/2019   PFIZER Comirnaty(Gray Top)Covid-19 Tri-Sucrose Vaccine 09/13/2019, 10/06/2019, 11/05/2019, 05/30/2020   PFIZER(Purple Top)SARS-COV-2 Vaccination 10/06/2019, 11/05/2019   Pfizer Covid-19 Vaccine Bivalent Booster 33yrs & up 04/03/2021   Pneumococcal-Unspecified 08/23/2014    Family History  Problem  Relation Age of Onset   Sarcoidosis Mother    Lupus Sister    Healthy Daughter    Healthy Son    Healthy Son      Current Outpatient Medications:    albuterol (VENTOLIN HFA) 108 (90 Base) MCG/ACT inhaler, Inhale 2 puffs into the lungs every 6 (six) hours as needed for wheezing or shortness of breath., Disp: 1 Inhaler, Rfl: 5   amitriptyline (ELAVIL) 25  MG tablet, Take 25-75 mg by mouth at bedtime., Disp: , Rfl:    Budeson-Glycopyrrol-Formoterol (BREZTRI AEROSPHERE) 160-9-4.8 MCG/ACT AERO, Inhale 2 puffs into the lungs in the morning and at bedtime., Disp: 10.7 g, Rfl: 0   buPROPion (WELLBUTRIN XL) 300 MG 24 hr tablet, Take 300 mg by mouth daily., Disp: , Rfl:    busPIRone (BUSPAR) 15 MG tablet, Take 15 mg by mouth daily., Disp: , Rfl:    carvedilol (COREG) 6.25 MG tablet, Take 6.25 mg by mouth 2 (two) times daily., Disp: , Rfl:    cetirizine (ZYRTEC) 10 MG tablet, TAKE 1 TABLET BY MOUTH AT BEDTIME, Disp: 30 tablet, Rfl: 0   fluticasone (FLONASE) 50 MCG/ACT nasal spray, Place 2 sprays into both nostrils daily as needed for allergies., Disp: , Rfl:    hydroxychloroquine (PLAQUENIL) 200 MG tablet, Take 400 mg by mouth daily. , Disp: , Rfl:    ibuprofen (ADVIL) 200 MG tablet, Take 600 mg by mouth every 6 (six) hours as needed for moderate pain., Disp: , Rfl:    lisinopril (ZESTRIL) 10 MG tablet, Take 10 mg by mouth daily., Disp: , Rfl:    Magnesium 250 MG TABS, Take 250 mg by mouth at bedtime., Disp: , Rfl:    montelukast (SINGULAIR) 10 MG tablet, TAKE 1 TABLET BY MOUTH AT BEDTIME, Disp: 30 tablet, Rfl: 3   mycophenolate (CELLCEPT) 500 MG tablet, Take 2 tablets (1,000 mg total) by mouth 2 (two) times daily., Disp: , Rfl:    Oxycodone HCl 10 MG TABS, Take 10 mg by mouth 4 (four) times daily., Disp: , Rfl:    QUEtiapine (SEROQUEL XR) 300 MG 24 hr tablet, Take 300 mg by mouth at bedtime., Disp: , Rfl:    sertraline (ZOLOFT) 100 MG tablet, Take 200 mg by mouth at bedtime., Disp: , Rfl: 0       Objective:   Vitals:   10/29/22 1452  BP: 108/80  Pulse: (!) 108  Temp: 99 F (37.2 C)  TempSrc: Oral  SpO2: 100%  Weight: 289 lb 3.2 oz (131.2 kg)  Height: 5\' 11"  (1.803 m)    Estimated body mass index is 40.34 kg/m as calculated from the following:   Height as of this encounter: 5\' 11"  (1.803 m).   Weight as of this encounter: 289 lb 3.2 oz (131.2 kg).  @WEIGHTCHANGE @  [employer]   10/29/22 1452  Weight: 289 lb 3.2 oz (131.2 kg)     Physical Exam   General: No distress. obese Neuro: Alert and Oriented x 3. GCS 15. Speech normal Psych: Pleasant Resp:  Barrel Chest - no.  Wheeze - no, Crackles - YES BASE, No overt respiratory distress CVS: Normal heart sounds. Murmurs - no Ext: Stigmata of Connective Tissue Disease - no HEENT: Normal upper airway. PEERL +. No post nasal drip        Assessment:       ICD-10-CM   1. ILD (interstitial lung disease) (HCC)  J84.9     2. Interstitial lung disease due to connective tissue disease (HCC)  J84.89    M35.9     3. Chronic bilateral low back pain, unspecified whether sciatica present  M54.50    G89.29          Plan:     Patient Instructions  Interstitial lung disease due to connective tissue disease (HCC) High risk medication use Immunosuppressed status  STable CT March 2023 Clinically stable with symptoms and exercise hypoemia test 10/29/2022    Plan ' -  Continue CellCept through the rheumatologist at [facility] -Continue Plaquenil through the rheumatologist at [facility] -Noted that you are off prednisone since 2021  -If pulmonary fibrosis gets worse then we can add nintedanib and consider you for research registry study called ILD-Pro -Get spirometry and DLCO in 6 months (cancel PFT 10/30/22 )   History of snoring Witnessed apneic spells OSA    - glad you are seeing Dr Val Eagle and are on CPAP  Plan  - per Dr Val Eagle   Low back pain  Plan   - get ok from your doctor and try stretch zone,  stretch lab or stretch med  Follow-up -Spirometry and DLCO in 4  months -Return to see Dr. Marchelle Gearing 15-minute ILD slot in 4 months  - symptoms score and walk test in 4 months    SIGNATURE    Dr. Kalman Shan, M.D., F.C.C.P,  Pulmonary and Critical Care Medicine Staff Physician, [facility] Director - Interstitial Lung Disease  Program  Pulmonary Fibrosis [facility] at [facility] [place], [place], [address]  Pager: [phone], If no answer or between  15:00h - 7:00h: call [phone] Telephone: [phone]  3:23 PM 10/29/2022

## 2022-10-30 ENCOUNTER — Telehealth: Payer: Self-pay | Admitting: *Deleted

## 2022-10-30 DIAGNOSIS — Z96652 Presence of left artificial knee joint: Secondary | ICD-10-CM | POA: Diagnosis not present

## 2022-10-30 DIAGNOSIS — M47816 Spondylosis without myelopathy or radiculopathy, lumbar region: Secondary | ICD-10-CM | POA: Diagnosis not present

## 2022-10-30 DIAGNOSIS — M25561 Pain in right knee: Secondary | ICD-10-CM | POA: Diagnosis not present

## 2022-10-30 DIAGNOSIS — Z79891 Long term (current) use of opiate analgesic: Secondary | ICD-10-CM | POA: Diagnosis not present

## 2022-10-30 DIAGNOSIS — M75102 Unspecified rotator cuff tear or rupture of left shoulder, not specified as traumatic: Secondary | ICD-10-CM | POA: Diagnosis not present

## 2022-10-30 DIAGNOSIS — Z1389 Encounter for screening for other disorder: Secondary | ICD-10-CM | POA: Diagnosis not present

## 2022-10-30 DIAGNOSIS — Z96651 Presence of right artificial knee joint: Secondary | ICD-10-CM | POA: Diagnosis not present

## 2022-10-30 DIAGNOSIS — M1711 Unilateral primary osteoarthritis, right knee: Secondary | ICD-10-CM | POA: Diagnosis not present

## 2022-10-30 DIAGNOSIS — E669 Obesity, unspecified: Secondary | ICD-10-CM | POA: Diagnosis not present

## 2022-10-30 NOTE — Telephone Encounter (Signed)
Called and spoke with patient regarding AVS since she left prior to returing to the room.  Verified mailing address and advised I would cancel the PFT for today.  She was driving during the call, so she will call and schedule her f/u and PFT.  Nothing further needed.

## 2022-10-31 DIAGNOSIS — D508 Other iron deficiency anemias: Secondary | ICD-10-CM | POA: Diagnosis not present

## 2022-10-31 DIAGNOSIS — Z6839 Body mass index (BMI) 39.0-39.9, adult: Secondary | ICD-10-CM | POA: Diagnosis not present

## 2022-11-03 DIAGNOSIS — M47816 Spondylosis without myelopathy or radiculopathy, lumbar region: Secondary | ICD-10-CM | POA: Diagnosis not present

## 2022-11-04 ENCOUNTER — Other Ambulatory Visit: Payer: Self-pay

## 2022-11-04 ENCOUNTER — Telehealth: Payer: Self-pay | Admitting: Internal Medicine

## 2022-11-04 MED ORDER — ALBUTEROL SULFATE HFA 108 (90 BASE) MCG/ACT IN AERS: 2.0000 | INHALATION_SPRAY | Freq: Four times a day (QID) | RESPIRATORY_TRACT | 5 refills | Status: AC | PRN

## 2022-11-04 MED ORDER — FLUTICASONE PROPIONATE 50 MCG/ACT NA SUSP: 2.0000 | Freq: Every day | NASAL | 1 refills | Status: DC | PRN

## 2022-11-04 MED ORDER — BREZTRI AEROSPHERE 160-9-4.8 MCG/ACT IN AERO: 2.0000 | INHALATION_SPRAY | Freq: Two times a day (BID) | RESPIRATORY_TRACT | 0 refills | Status: DC

## 2022-11-04 NOTE — Telephone Encounter (Signed)
Patient called to request a refill for her Breztri, Albuterol and Nasal spray.  She stated she had requested it before but have not heard anything.  Please advise and call patient to discuss further.  CB# [phone]

## 2022-11-04 NOTE — Telephone Encounter (Signed)
ATC pt LVM letting her know that I refilled her medications, and they were sent to [facility] on [address]

## 2022-11-04 NOTE — Telephone Encounter (Signed)
ATC X1 LVM for patient to call the office back. Please verify pharmacy patient wants to use

## 2022-11-07 DIAGNOSIS — R413 Other amnesia: Secondary | ICD-10-CM | POA: Diagnosis not present

## 2022-11-07 DIAGNOSIS — M328 Other forms of systemic lupus erythematosus: Secondary | ICD-10-CM | POA: Diagnosis not present

## 2022-11-08 ENCOUNTER — Ambulatory Visit
Admission: RE | Admit: 2022-11-08 | Discharge: 2022-11-08 | Disposition: A | Discharge status: Home / Self Care | Payer: Medicare HMO | Source: Ambulatory Visit | Attending: Nurse Practitioner | Admitting: Nurse Practitioner

## 2022-11-08 VITALS — BP 120/87 | HR 100 | Temp 98.5°F | Resp 18

## 2022-11-08 DIAGNOSIS — H6122 Impacted cerumen, left ear: Secondary | ICD-10-CM

## 2022-11-08 NOTE — Discharge Instructions (Addendum)
Follow up as needed

## 2022-11-08 NOTE — ED Triage Notes (Signed)
Pt presents to [facility] w/ c/o left ear muffled since yesterday. Pt had states she had an MRI yesterday and had used ear plugs, and the left ear has been muffled since then. Denies pain or drainage from ear.

## 2022-11-08 NOTE — ED Provider Notes (Signed)
[facility]    CSN: [id number] Arrival date & time: 11/08/22  1026      History   Chief Complaint Chief Complaint  Patient presents with   Ear Fullness    left    HPI Shannon [name] is a 52 y.o. female presents for evaluation of clogged ear.  Patient states she had an MRI yesterday.  That required her to wear earplugs.  When she took them out she had a clogged left ear with muffled hearing.  Right ear is normal.  Denies any pain or drainage.  No URI symptoms.  No OTC medications have been used since onset.  No other concerns at this time.   Ear Fullness    Past Medical History:  Diagnosis Date   Anemia    Anxiety    Depression    Dyspnea    Sometimes at rest   G6PD deficiency    GERD (gastroesophageal reflux disease)    Hypertension    ILD (interstitial lung disease) (HCC)    Lupus (HCC)    Pneumonia    Rheumatoid arthritis (HCC)    Sleep apnea     Patient Active Problem List   Diagnosis Date Noted   Preoperative clearance 06/10/2022   Lumbar spondylosis 03/19/2022   Chronic low back pain 03/06/2022   Spondylosis without myelopathy 03/06/2022   Upper airway cough syndrome 12/13/2021   Degenerative disc disease at L5-S1 level 09/25/2021   Cervical radiculopathy 06/07/2021   Lumbar radiculopathy 11/22/2020   Pain of left hip joint 08/22/2020   Stiffness of right knee 05/03/2020   Primary osteoarthritis of right knee 04/30/2020   Sleep apnea 04/27/2020   Anemia 09/22/2019   Stiffness of left knee 04/08/2019   OA (osteoarthritis) of knee 04/04/2019   Acute bronchitis 02/13/2017   Cough variant asthma  vs uacs  12/17/2016   Morbid obesity due to excess calories (HCC) 12/17/2016   Contracture of left elbow 11/21/2016   Primary osteoarthritis of both knees 11/21/2016   Pain in right hand 09/09/2016   Pain in left hand 09/09/2016   History of osteoarthritis 09/09/2016   Rheumatoid factor positive 08/01/2016   Laryngopharyngeal reflux (LPR)  08/23/2015   High risk medication use 01/16/2015   Other long term (current) drug therapy 01/16/2015   G6PD deficiency 10/31/2014   Dyspnea 08/23/2014   ILD (interstitial lung disease) (HCC) 08/23/2014   Other organ or system involvement in systemic lupus erythematosus (HCC) 08/23/2014   Rheumatoid arthritis (HCC) 08/23/2014   Raynaud disease 08/08/2014    Past Surgical History:  Procedure Laterality Date   ABDOMINAL HYSTERECTOMY     APPENDECTOMY  1980   cardiac catherization  08/20/2017   CARDIAC CATHETERIZATION N/A 05/10/2015   Procedure: Right Heart Cath;  Surgeon: Laurey Morale, MD;  Location: [facility] INVASIVE CV LAB;  Service: Cardiovascular;  Laterality: N/A;   CESAREAN SECTION  '95, '02, '07   X 3   CHOLECYSTECTOMY  2010   LAPAROSCOPIC HYSTERECTOMY  10/2015   have ovaries   RIGHT HEART CATH N/A 08/20/2017   Procedure: RIGHT HEART CATH;  Surgeon: Laurey Morale, MD;  Location: [facility] INVASIVE CV LAB;  Service: Cardiovascular;  Laterality: N/A;   SHOULDER ARTHROSCOPY WITH ROTATOR CUFF REPAIR AND SUBACROMIAL DECOMPRESSION Left 08/13/2022   Procedure: SHOULDER ARTHROSCOPY WITH SUBACROMIAL DECOMPRESSION, DEBRIDEMENT AND PARTIAL ROTATOR CUFF REPAIR;  Surgeon: Jene Every, MD;  Location: [facility];  Service: Orthopedics;  Laterality: Left;   SHOULDER ARTHROSCOPY WITH SUBACROMIAL DECOMPRESSION Right 08/28/2021  Procedure: SHOULDER ARTHROSCOPY WITH SUBACROMIAL DECOMPRESSION AND DEBRIDEMENT;  Surgeon: Jene Every, MD;  Location: [facility];  Service: Orthopedics;  Laterality: Right;   TOTAL KNEE ARTHROPLASTY Left 04/04/2019   Procedure: TOTAL KNEE ARTHROPLASTY;  Surgeon: Ollen Gross, MD;  Location: [facility];  Service: Orthopedics;  Laterality: Left;    TOTAL KNEE ARTHROPLASTY Right 04/30/2020   Procedure: TOTAL KNEE ARTHROPLASTY;  Surgeon: Ollen Gross, MD;  Location: [facility];  Service: Orthopedics;  Laterality: Right;    TUBAL LIGATION      OB History   No obstetric history on  file.      Home Medications    Prior to Admission medications   Medication Sig Start Date End Date Taking? Authorizing Provider  albuterol (VENTOLIN HFA) 108 (90 Base) MCG/ACT inhaler Inhale 2 puffs into the lungs every 6 (six) hours as needed for wheezing or shortness of breath. 11/04/22   Kalman Shan, MD  amitriptyline (ELAVIL) 25 MG tablet Take 25-75 mg by mouth at bedtime. 05/28/22   [provider]  Budeson-Glycopyrrol-Formoterol (BREZTRI AEROSPHERE) 160-9-4.8 MCG/ACT AERO Inhale 2 puffs into the lungs in the morning and at bedtime. 11/04/22   Kalman Shan, MD  buPROPion (WELLBUTRIN XL) 300 MG 24 hr tablet Take 300 mg by mouth daily.    [provider]  busPIRone (BUSPAR) 15 MG tablet Take 15 mg by mouth daily.    [provider]  carvedilol (COREG) 6.25 MG tablet Take 6.25 mg by mouth 2 (two) times daily. 08/11/21   [provider]  cetirizine (ZYRTEC) 10 MG tablet TAKE 1 TABLET BY MOUTH AT BEDTIME 08/12/22   Cobb, Ruby Cola, NP  fluticasone (FLONASE) 50 MCG/ACT nasal spray Place 2 sprays into both nostrils daily as needed for allergies. 11/04/22   Kalman Shan, MD  hydroxychloroquine (PLAQUENIL) 200 MG tablet Take 400 mg by mouth daily.     [provider]  ibuprofen (ADVIL) 200 MG tablet Take 600 mg by mouth every 6 (six) hours as needed for moderate pain.    [provider]  lisinopril (ZESTRIL) 10 MG tablet Take 10 mg by mouth daily.    [provider]  Magnesium 250 MG TABS Take 250 mg by mouth at bedtime.    [provider]  montelukast (SINGULAIR) 10 MG tablet TAKE 1 TABLET BY MOUTH AT BEDTIME 08/15/22   Kalman Shan, MD  mycophenolate (CELLCEPT) 500 MG tablet Take 2 tablets (1,000 mg total) by mouth 2 (two) times daily. 05/02/20   Edmisten, Kristie L, PA  Oxycodone HCl 10 MG TABS Take 10 mg by mouth 4 (four) times daily. 12/12/19   [provider]  QUEtiapine (SEROQUEL XR) 300 MG 24  hr tablet Take 300 mg by mouth at bedtime.    [provider]  sertraline (ZOLOFT) 100 MG tablet Take 200 mg by mouth at bedtime. 02/23/18   [provider]    Family History Family History  Problem Relation Age of Onset   Sarcoidosis Mother    Lupus Sister    Healthy Daughter    Healthy Son    Healthy Son     Social History Social History   Tobacco Use   Smoking status: Former    Packs/day: 0.50    Years: 14.00    Additional pack years: 0.00    Total pack years: 7.00    Types: Cigarettes    Quit date: 09/25/2014    Years since quitting: 8.1   Smokeless tobacco: Never  Vaping Use  Vaping Use: Never used  Substance Use Topics   Alcohol use: Yes    Alcohol/week: 0.0 standard drinks of alcohol    Comment: occas.   Drug use: No     Allergies   Asa [aspirin], Penicillins, and Sulfa antibiotics   Review of Systems Review of Systems  HENT:         Plugged left eear     Physical Exam Triage Vital Signs ED Triage Vitals  Enc Vitals Group     BP 11/08/22 1045 120/87     Pulse Rate 11/08/22 1045 100     Resp 11/08/22 1045 18     Temp 11/08/22 1045 98.5 F (36.9 C)     Temp Source 11/08/22 1045 Oral     SpO2 11/08/22 1045 97 %     Weight --      Height --      Head Circumference --      Peak Flow --      Pain Score 11/08/22 1047 0     Pain Loc --      Pain Edu? --      Excl. in GC? --    No data found.  Updated Vital Signs BP 120/87 (BP Location: Left Arm)   Pulse 100   Temp 98.5 F (36.9 C) (Oral)   Resp 18   LMP 04/28/2015   SpO2 97%   Visual Acuity Right Eye Distance:   Left Eye Distance:   Bilateral Distance:    Right Eye Near:   Left Eye Near:    Bilateral Near:     Physical Exam Vitals and nursing note reviewed.  Constitutional:      General: She is not in acute distress.    Appearance: Normal appearance. She is not ill-appearing.  HENT:     Head: Normocephalic and atraumatic.     Right Ear: Tympanic membrane and  ear canal normal.     Left Ear: There is impacted cerumen.     Ears:     Comments: After irrigation left TM and canal within normal limits Eyes:     Pupils: Pupils are equal, round, and reactive to light.  Cardiovascular:     Rate and Rhythm: Normal rate.  Pulmonary:     Effort: Pulmonary effort is normal.  Skin:    General: Skin is warm and dry.  Neurological:     General: No focal deficit present.     Mental Status: She is alert and oriented to person, place, and time.  Psychiatric:        Mood and Affect: Mood normal.        Behavior: Behavior normal.      UC Treatments / Results  Labs (all labs ordered are listed, but only abnormal results are displayed) Labs Reviewed - No data to display  EKG   Radiology No results found.  Procedures Procedures (including critical care time)  Medications Ordered in UC Medications - No data to display  Initial Impression / Assessment and Plan / UC Course  I have reviewed the triage vital signs and the nursing notes.  Pertinent labs & imaging results that were available during my care of the patient were reviewed by me and considered in my medical decision making (see chart for details).     Left ear with cerumen impaction.  Ear was irrigated in clinic by nursing staff and patient tolerated well.  TM and canal within normal limits after irrigation Patient advised to follow-up as needed  Final Clinical Impressions(s) / UC Diagnoses   Final diagnoses:  Left ear impacted cerumen   Discharge Instructions   None    ED Prescriptions   None    PDMP not reviewed this encounter.   Radford Pax, NP 11/08/22 [phone]

## 2022-11-09 DIAGNOSIS — M545 Low back pain, unspecified: Secondary | ICD-10-CM | POA: Diagnosis not present

## 2022-11-15 ENCOUNTER — Other Ambulatory Visit: Payer: Self-pay | Admitting: Nurse Practitioner

## 2022-11-18 DIAGNOSIS — M545 Low back pain, unspecified: Secondary | ICD-10-CM | POA: Diagnosis not present

## 2022-11-21 DIAGNOSIS — E559 Vitamin D deficiency, unspecified: Secondary | ICD-10-CM | POA: Diagnosis not present

## 2022-11-21 DIAGNOSIS — Z6839 Body mass index (BMI) 39.0-39.9, adult: Secondary | ICD-10-CM | POA: Diagnosis not present

## 2022-11-26 DIAGNOSIS — Z79891 Long term (current) use of opiate analgesic: Secondary | ICD-10-CM | POA: Diagnosis not present

## 2022-11-26 DIAGNOSIS — M47816 Spondylosis without myelopathy or radiculopathy, lumbar region: Secondary | ICD-10-CM | POA: Diagnosis not present

## 2022-11-26 DIAGNOSIS — Z1389 Encounter for screening for other disorder: Secondary | ICD-10-CM | POA: Diagnosis not present

## 2022-11-26 DIAGNOSIS — Z96652 Presence of left artificial knee joint: Secondary | ICD-10-CM | POA: Diagnosis not present

## 2022-11-26 DIAGNOSIS — M75102 Unspecified rotator cuff tear or rupture of left shoulder, not specified as traumatic: Secondary | ICD-10-CM | POA: Diagnosis not present

## 2022-11-26 DIAGNOSIS — M1711 Unilateral primary osteoarthritis, right knee: Secondary | ICD-10-CM | POA: Diagnosis not present

## 2022-11-26 DIAGNOSIS — E669 Obesity, unspecified: Secondary | ICD-10-CM | POA: Diagnosis not present

## 2022-11-26 DIAGNOSIS — M25561 Pain in right knee: Secondary | ICD-10-CM | POA: Diagnosis not present

## 2022-11-26 DIAGNOSIS — M5416 Radiculopathy, lumbar region: Secondary | ICD-10-CM | POA: Diagnosis not present

## 2022-11-26 DIAGNOSIS — Z96651 Presence of right artificial knee joint: Secondary | ICD-10-CM | POA: Diagnosis not present

## 2022-11-27 DIAGNOSIS — G4733 Obstructive sleep apnea (adult) (pediatric): Secondary | ICD-10-CM | POA: Diagnosis not present

## 2022-11-28 DIAGNOSIS — M545 Low back pain, unspecified: Secondary | ICD-10-CM | POA: Diagnosis not present

## 2022-12-04 DIAGNOSIS — M47816 Spondylosis without myelopathy or radiculopathy, lumbar region: Secondary | ICD-10-CM | POA: Diagnosis not present

## 2022-12-07 ENCOUNTER — Other Ambulatory Visit: Payer: Self-pay | Admitting: Internal Medicine

## 2022-12-08 DIAGNOSIS — M792 Neuralgia and neuritis, unspecified: Secondary | ICD-10-CM | POA: Diagnosis not present

## 2022-12-08 DIAGNOSIS — M791 Myalgia, unspecified site: Secondary | ICD-10-CM | POA: Diagnosis not present

## 2022-12-08 DIAGNOSIS — M545 Low back pain, unspecified: Secondary | ICD-10-CM | POA: Diagnosis not present

## 2022-12-15 ENCOUNTER — Other Ambulatory Visit: Payer: Self-pay

## 2022-12-15 ENCOUNTER — Encounter (HOSPITAL_BASED_OUTPATIENT_CLINIC_OR_DEPARTMENT_OTHER): Payer: Self-pay | Admitting: Emergency Medicine

## 2022-12-15 ENCOUNTER — Emergency Department (HOSPITAL_BASED_OUTPATIENT_CLINIC_OR_DEPARTMENT_OTHER)
Admission: EM | Admit: 2022-12-15 | Discharge: 2022-12-15 | Disposition: A | Discharge status: Home / Self Care | Payer: Medicare HMO | Attending: Emergency Medicine | Admitting: Emergency Medicine

## 2022-12-15 DIAGNOSIS — Z96653 Presence of artificial knee joint, bilateral: Secondary | ICD-10-CM | POA: Insufficient documentation

## 2022-12-15 DIAGNOSIS — M791 Myalgia, unspecified site: Secondary | ICD-10-CM | POA: Diagnosis not present

## 2022-12-15 DIAGNOSIS — Z87891 Personal history of nicotine dependence: Secondary | ICD-10-CM | POA: Insufficient documentation

## 2022-12-15 DIAGNOSIS — Z1152 Encounter for screening for COVID-19: Secondary | ICD-10-CM | POA: Diagnosis not present

## 2022-12-15 DIAGNOSIS — E860 Dehydration: Secondary | ICD-10-CM | POA: Insufficient documentation

## 2022-12-15 DIAGNOSIS — E876 Hypokalemia: Secondary | ICD-10-CM | POA: Diagnosis not present

## 2022-12-15 DIAGNOSIS — Z79899 Other long term (current) drug therapy: Secondary | ICD-10-CM | POA: Diagnosis not present

## 2022-12-15 DIAGNOSIS — I10 Essential (primary) hypertension: Secondary | ICD-10-CM | POA: Insufficient documentation

## 2022-12-15 DIAGNOSIS — K529 Noninfective gastroenteritis and colitis, unspecified: Secondary | ICD-10-CM | POA: Diagnosis not present

## 2022-12-15 DIAGNOSIS — R112 Nausea with vomiting, unspecified: Secondary | ICD-10-CM | POA: Diagnosis not present

## 2022-12-15 LAB — CBC WITH DIFFERENTIAL/PLATELET
Abs Immature Granulocytes: 0.01 10*3/uL (ref 0.00–0.07)
Basophils Absolute: 0 10*3/uL (ref 0.0–0.1)
Basophils Relative: 0 %
Eosinophils Absolute: 0.1 10*3/uL (ref 0.0–0.5)
Eosinophils Relative: 1 %
HCT: 38.4 % (ref 36.0–46.0)
Hemoglobin: 12.6 g/dL (ref 12.0–15.0)
Immature Granulocytes: 0 %
Lymphocytes Relative: 34 %
Lymphs Abs: 1.7 10*3/uL (ref 0.7–4.0)
MCH: 29.9 pg (ref 26.0–34.0)
MCHC: 32.8 g/dL (ref 30.0–36.0)
MCV: 91.2 fL (ref 80.0–100.0)
Monocytes Absolute: 0.3 10*3/uL (ref 0.1–1.0)
Monocytes Relative: 7 %
Neutro Abs: 2.9 10*3/uL (ref 1.7–7.7)
Neutrophils Relative %: 58 %
Platelets: 265 10*3/uL (ref 150–400)
RBC: 4.21 MIL/uL (ref 3.87–5.11)
RDW: 13 % (ref 11.5–15.5)
WBC: 5 10*3/uL (ref 4.0–10.5)
nRBC: 0 % (ref 0.0–0.2)

## 2022-12-15 LAB — SARS CORONAVIRUS 2 BY RT PCR: SARS Coronavirus 2 by RT PCR: NEGATIVE

## 2022-12-15 LAB — BASIC METABOLIC PANEL
Anion gap: 10 (ref 5–15)
BUN: 13 mg/dL (ref 6–20)
CO2: 26 mmol/L (ref 22–32)
Calcium: 8.5 mg/dL — ABNORMAL LOW (ref 8.9–10.3)
Chloride: 98 mmol/L (ref 98–111)
Creatinine, Ser: 0.93 mg/dL (ref 0.44–1.00)
GFR, Estimated: >60 mL/min (ref >60)
Glucose, Bld: 89 mg/dL (ref 70–99)
Potassium: 4.3 mmol/L (ref 3.5–5.1)
Sodium: 134 mmol/L — ABNORMAL LOW (ref 135–145)

## 2022-12-15 LAB — URINALYSIS, MICROSCOPIC (REFLEX): RBC / HPF: NONE SEEN RBC/hpf (ref 0–5)

## 2022-12-15 LAB — URINALYSIS, ROUTINE W REFLEX MICROSCOPIC
Bilirubin Urine: NEGATIVE
Glucose, UA: NEGATIVE mg/dL
Hgb urine dipstick: NEGATIVE
Ketones, ur: NEGATIVE mg/dL
Leukocytes,Ua: NEGATIVE
Nitrite: NEGATIVE
Protein, ur: 30 mg/dL — AB
Specific Gravity, Urine: >=1.03 (ref 1.005–1.030)
pH: 6 (ref 5.0–8.0)

## 2022-12-15 LAB — PREGNANCY, URINE: Preg Test, Ur: NEGATIVE

## 2022-12-15 MED ORDER — ONDANSETRON 8 MG PO TBDP: 8.0000 mg | ORAL_TABLET | Freq: Three times a day (TID) | ORAL | 0 refills | Status: DC | PRN

## 2022-12-15 MED ORDER — SODIUM CHLORIDE 0.9 % IV BOLUS
1000.0000 mL | Freq: Once | INTRAVENOUS | Status: AC
  Administered 2022-12-15: 1000 mL via INTRAVENOUS

## 2022-12-15 MED ORDER — ONDANSETRON HCL 4 MG/2ML IJ SOLN
4.0000 mg | Freq: Once | INTRAMUSCULAR | Status: AC
  Administered 2022-12-15: 4 mg via INTRAVENOUS
  Filled 2022-12-15: qty 2

## 2022-12-15 MED ORDER — KETOROLAC TROMETHAMINE 15 MG/ML IJ SOLN
15.0000 mg | Freq: Once | INTRAMUSCULAR | Status: AC
  Administered 2022-12-15: 15 mg via INTRAVENOUS
  Filled 2022-12-15: qty 1

## 2022-12-15 NOTE — ED Triage Notes (Signed)
Pt reports body aches, weak, fatigued, headache, and dizziness since Tues

## 2022-12-15 NOTE — ED Provider Notes (Signed)
[facility] Provider Note: Lowella Dell, MD, FACEP  CSN: [id number] MRN: [id number] ARRIVAL: 12/15/22 at 0023 ROOM: MH10/[facility]10   CHIEF COMPLAINT  Generalized Body Aches   HISTORY OF PRESENT ILLNESS  12/15/22 2:35 AM Shannon [name] is a 52 y.o. female with with multiple medical problems.  She is here with bodyaches, generalized weakness and headache that began 6 days ago.  The following 2 days she developed nausea, vomiting and diarrhea without abdominal pain.  Those symptoms have abated but she continues to feel weak.  She has been lightheaded with standing for about the past 2 weeks.    Past Medical History:  Diagnosis Date   Anemia    Anxiety    Depression    Dyspnea    Sometimes at rest   G6PD deficiency    GERD (gastroesophageal reflux disease)    Hypertension    ILD (interstitial lung disease) (HCC)    Lupus (HCC)    Pneumonia    Rheumatoid arthritis (HCC)    Sleep apnea     Past Surgical History:  Procedure Laterality Date   ABDOMINAL HYSTERECTOMY     APPENDECTOMY  1980   cardiac catherization  08/20/2017   CARDIAC CATHETERIZATION N/A 05/10/2015   Procedure: Right Heart Cath;  Surgeon: Laurey Morale, MD;  Location: [facility] INVASIVE CV LAB;  Service: Cardiovascular;  Laterality: N/A;   CESAREAN SECTION  '95, '02, '07   X 3   CHOLECYSTECTOMY  2010   LAPAROSCOPIC HYSTERECTOMY  10/2015   have ovaries   RIGHT HEART CATH N/A 08/20/2017   Procedure: RIGHT HEART CATH;  Surgeon: Laurey Morale, MD;  Location: [facility] INVASIVE CV LAB;  Service: Cardiovascular;  Laterality: N/A;   SHOULDER ARTHROSCOPY WITH ROTATOR CUFF REPAIR AND SUBACROMIAL DECOMPRESSION Left 08/13/2022   Procedure: SHOULDER ARTHROSCOPY WITH SUBACROMIAL DECOMPRESSION, DEBRIDEMENT AND PARTIAL ROTATOR CUFF REPAIR;  Surgeon: Jene Every, MD;  Location: [facility];  Service: Orthopedics;  Laterality: Left;   SHOULDER ARTHROSCOPY WITH SUBACROMIAL DECOMPRESSION Right 08/28/2021   Procedure: SHOULDER  ARTHROSCOPY WITH SUBACROMIAL DECOMPRESSION AND DEBRIDEMENT;  Surgeon: Jene Every, MD;  Location: [facility];  Service: Orthopedics;  Laterality: Right;   TOTAL KNEE ARTHROPLASTY Left 04/04/2019   Procedure: TOTAL KNEE ARTHROPLASTY;  Surgeon: Ollen Gross, MD;  Location: [facility];  Service: Orthopedics;  Laterality: Left;    TOTAL KNEE ARTHROPLASTY Right 04/30/2020   Procedure: TOTAL KNEE ARTHROPLASTY;  Surgeon: Ollen Gross, MD;  Location: [facility];  Service: Orthopedics;  Laterality: Right;    TUBAL LIGATION      Family History  Problem Relation Age of Onset   Sarcoidosis Mother    Lupus Sister    Healthy Daughter    Healthy Son    Healthy Son     Social History   Tobacco Use   Smoking status: Former    Packs/day: 0.50    Years: 14.00    Additional pack years: 0.00    Total pack years: 7.00    Types: Cigarettes    Quit date: 09/25/2014    Years since quitting: 8.2   Smokeless tobacco: Never  Vaping Use   Vaping Use: Never used  Substance Use Topics   Alcohol use: Yes    Alcohol/week: 0.0 standard drinks of alcohol    Comment: occas.   Drug use: No    Prior to Admission medications   Medication Sig Start Date End Date Taking? Authorizing Provider  albuterol (VENTOLIN HFA) 108 (90 Base) MCG/ACT inhaler Inhale  2 puffs into the lungs every 6 (six) hours as needed for wheezing or shortness of breath. 11/04/22   Kalman Shan, MD  amitriptyline (ELAVIL) 25 MG tablet Take 25-75 mg by mouth at bedtime. 05/28/22   [provider]  Budeson-Glycopyrrol-Formoterol (BREZTRI AEROSPHERE) 160-9-4.8 MCG/ACT AERO Inhale 2 puffs into the lungs in the morning and at bedtime. 11/04/22   Kalman Shan, MD  buPROPion (WELLBUTRIN XL) 300 MG 24 hr tablet Take 300 mg by mouth daily.    [provider]  busPIRone (BUSPAR) 15 MG tablet Take 15 mg by mouth daily.    [provider]  carvedilol (COREG) 6.25 MG tablet Take 6.25 mg by mouth 2 (two) times daily.  08/11/21   [provider]  cetirizine (ZYRTEC) 10 MG tablet TAKE 1 TABLET BY MOUTH AT BEDTIME 11/18/22   Cobb, Ruby Cola, NP  fluticasone (FLONASE) 50 MCG/ACT nasal spray Place 2 sprays into both nostrils daily as needed for allergies. 11/04/22   Kalman Shan, MD  hydroxychloroquine (PLAQUENIL) 200 MG tablet Take 400 mg by mouth daily.     [provider]  ibuprofen (ADVIL) 200 MG tablet Take 600 mg by mouth every 6 (six) hours as needed for moderate pain.    [provider]  lisinopril (ZESTRIL) 10 MG tablet Take 10 mg by mouth daily.    [provider]  Magnesium 250 MG TABS Take 250 mg by mouth at bedtime.    [provider]  montelukast (SINGULAIR) 10 MG tablet TAKE 1 TABLET BY MOUTH AT BEDTIME 12/08/22   Kalman Shan, MD  mycophenolate (CELLCEPT) 500 MG tablet Take 2 tablets (1,000 mg total) by mouth 2 (two) times daily. 05/02/20   Edmisten, Kristie L, PA  Oxycodone HCl 10 MG TABS Take 10 mg by mouth 4 (four) times daily. 12/12/19   [provider]  QUEtiapine (SEROQUEL XR) 300 MG 24 hr tablet Take 300 mg by mouth at bedtime.    [provider]  sertraline (ZOLOFT) 100 MG tablet Take 200 mg by mouth at bedtime. 02/23/18   [provider]    Allergies Asa [aspirin], Penicillins, and Sulfa antibiotics   REVIEW OF SYSTEMS  Negative except as noted here or in the History of Present Illness.   PHYSICAL EXAMINATION  Initial Vital Signs Blood pressure (!) 133/102, pulse (!) 105, temperature 98.3 F (36.8 C), temperature source Oral, resp. rate 17, height 5\' 11"  (1.803 m), weight 124.3 kg, last menstrual period 04/28/2015, SpO2 98 %.  Examination General: Well-developed, well-nourished female in no acute distress; appearance consistent with age of record HENT: normocephalic; atraumatic Eyes: Normal appearance Neck: supple Heart: regular rate and rhythm Lungs: clear to auscultation bilaterally Abdomen: soft;  nondistended; nontender; bowel sounds present Extremities: No deformity; full range of motion; pulses normal Neurologic: Awake, alert and oriented; motor function intact in all extremities and symmetric; no facial droop Skin: Warm and dry Psychiatric: Normal mood and affect   RESULTS  Summary of this visit's results, reviewed and interpreted by myself:   EKG Interpretation  Date/Time:    Ventricular Rate:    PR Interval:    QRS Duration:   QT Interval:    QTC Calculation:   R Axis:     Text Interpretation:         Laboratory Studies: Results for orders placed or performed during the hospital encounter of 12/15/22 (from the past 24 hour(s))  SARS Coronavirus 2 by RT PCR (hospital order, performed in [facility] hospital lab) *  cepheid single result test* Anterior Nasal Swab     Status: None   Collection Time: 12/15/22 12:36 AM   Specimen: Anterior Nasal Swab  Result Value Ref Range   SARS Coronavirus 2 by RT PCR NEGATIVE NEGATIVE  Urinalysis, Routine w reflex microscopic -Urine, Clean Catch     Status: Abnormal   Collection Time: 12/15/22  3:00 AM  Result Value Ref Range   Color, Urine YELLOW YELLOW   APPearance CLEAR CLEAR   Specific Gravity, Urine >=1.030 1.005 - 1.030   pH 6.0 5.0 - 8.0   Glucose, UA NEGATIVE NEGATIVE mg/dL   Hgb urine dipstick NEGATIVE NEGATIVE   Bilirubin Urine NEGATIVE NEGATIVE   Ketones, ur NEGATIVE NEGATIVE mg/dL   Protein, ur 30 (A) NEGATIVE mg/dL   Nitrite NEGATIVE NEGATIVE   Leukocytes,Ua NEGATIVE NEGATIVE  Pregnancy, urine     Status: None   Collection Time: 12/15/22  3:00 AM  Result Value Ref Range   Preg Test, Ur NEGATIVE NEGATIVE  Urinalysis, Microscopic (reflex)     Status: Abnormal   Collection Time: 12/15/22  3:00 AM  Result Value Ref Range   RBC / HPF NONE SEEN 0 - 5 RBC/hpf   WBC, UA 0-5 0 - 5 WBC/hpf   Bacteria, UA FEW (A) NONE SEEN   Squamous Epithelial / HPF 6-10 0 - 5 /HPF   Mucus PRESENT   CBC with Differential      Status: None   Collection Time: 12/15/22  3:05 AM  Result Value Ref Range   WBC 5.0 4.0 - 10.5 K/uL   RBC 4.21 3.87 - 5.11 MIL/uL   Hemoglobin 12.6 12.0 - 15.0 g/dL   HCT 16.1 09.6 - 04.5 %   MCV 91.2 80.0 - 100.0 fL   MCH 29.9 26.0 - 34.0 pg   MCHC 32.8 30.0 - 36.0 g/dL   RDW 40.9 81.1 - 91.4 %   Platelets 265 150 - 400 K/uL   nRBC 0.0 0.0 - 0.2 %   Neutrophils Relative % 58 %   Neutro Abs 2.9 1.7 - 7.7 K/uL   Lymphocytes Relative 34 %   Lymphs Abs 1.7 0.7 - 4.0 K/uL   Monocytes Relative 7 %   Monocytes Absolute 0.3 0.1 - 1.0 K/uL   Eosinophils Relative 1 %   Eosinophils Absolute 0.1 0.0 - 0.5 K/uL   Basophils Relative 0 %   Basophils Absolute 0.0 0.0 - 0.1 K/uL   Immature Granulocytes 0 %   Abs Immature Granulocytes 0.01 0.00 - 0.07 K/uL  Basic metabolic panel     Status: Abnormal   Collection Time: 12/15/22  4:00 AM  Result Value Ref Range   Sodium 134 (L) 135 - 145 mmol/L   Potassium 4.3 3.5 - 5.1 mmol/L   Chloride 98 98 - 111 mmol/L   CO2 26 22 - 32 mmol/L   Glucose, Bld 89 70 - 99 mg/dL   BUN 13 6 - 20 mg/dL   Creatinine, Ser 7.82 0.44 - 1.00 mg/dL   Calcium 8.5 (L) 8.9 - 10.3 mg/dL   GFR, Estimated >95 >62 mL/min   Anion gap 10 5 - 15   Imaging Studies: No results found.  ED COURSE and MDM  Nursing notes, initial and subsequent vitals signs, including pulse oximetry, reviewed and interpreted by myself.  Vitals:   12/15/22 0031  BP: (!) 133/102  Pulse: (!) 105  Resp: 17  Temp: 98.3 F (36.8 C)  TempSrc: Oral  SpO2: 98%  Weight: 124.3 kg  Height: 5\' 11"  (1.803 m)   Medications  sodium chloride 0.9 % bolus 1,000 mL (1,000 mLs Intravenous New Bag/Given 12/15/22 0406)  ondansetron (ZOFRAN) injection 4 mg (4 mg Intravenous Given 12/15/22 0410)  ketorolac (TORADOL) 15 MG/ML injection 15 mg (15 mg Intravenous Given 12/15/22 0410)   5:19 AM Headache resolved, nausea improved after IV fluids and medications.  The urinalysis is consistent with dehydration.  I  suspect the patient had a gastrointestinal virus and did not adequately get rehydrated.  Apart from mild hypocalcemia her lab work is essentially normal.  She was advised to take over-the-counter calcium supplements.   PROCEDURES  Procedures   ED DIAGNOSES     ICD-10-CM   1. Gastroenteritis  K52.9     2. Dehydration  E86.0     3. Hypocalcemia  E83.51          Dellanira Dillow, Jonny Ruiz, MD 12/15/22 [phone]

## 2022-12-30 DIAGNOSIS — M1711 Unilateral primary osteoarthritis, right knee: Secondary | ICD-10-CM | POA: Diagnosis not present

## 2022-12-30 DIAGNOSIS — M47816 Spondylosis without myelopathy or radiculopathy, lumbar region: Secondary | ICD-10-CM | POA: Diagnosis not present

## 2022-12-30 DIAGNOSIS — Z79891 Long term (current) use of opiate analgesic: Secondary | ICD-10-CM | POA: Diagnosis not present

## 2022-12-30 DIAGNOSIS — Z1389 Encounter for screening for other disorder: Secondary | ICD-10-CM | POA: Diagnosis not present

## 2022-12-30 DIAGNOSIS — Z96651 Presence of right artificial knee joint: Secondary | ICD-10-CM | POA: Diagnosis not present

## 2022-12-30 DIAGNOSIS — Z96652 Presence of left artificial knee joint: Secondary | ICD-10-CM | POA: Diagnosis not present

## 2022-12-30 DIAGNOSIS — E669 Obesity, unspecified: Secondary | ICD-10-CM | POA: Diagnosis not present

## 2022-12-30 DIAGNOSIS — M25561 Pain in right knee: Secondary | ICD-10-CM | POA: Diagnosis not present

## 2022-12-30 DIAGNOSIS — M75102 Unspecified rotator cuff tear or rupture of left shoulder, not specified as traumatic: Secondary | ICD-10-CM | POA: Diagnosis not present

## 2023-01-03 DIAGNOSIS — M47816 Spondylosis without myelopathy or radiculopathy, lumbar region: Secondary | ICD-10-CM | POA: Diagnosis not present

## 2023-01-07 DIAGNOSIS — N3941 Urge incontinence: Secondary | ICD-10-CM | POA: Diagnosis not present

## 2023-01-07 DIAGNOSIS — R2681 Unsteadiness on feet: Secondary | ICD-10-CM | POA: Diagnosis not present

## 2023-01-07 DIAGNOSIS — K219 Gastro-esophageal reflux disease without esophagitis: Secondary | ICD-10-CM | POA: Diagnosis not present

## 2023-01-07 DIAGNOSIS — Z008 Encounter for other general examination: Secondary | ICD-10-CM | POA: Diagnosis not present

## 2023-01-07 DIAGNOSIS — M1990 Unspecified osteoarthritis, unspecified site: Secondary | ICD-10-CM | POA: Diagnosis not present

## 2023-01-07 DIAGNOSIS — Z79891 Long term (current) use of opiate analgesic: Secondary | ICD-10-CM | POA: Diagnosis not present

## 2023-01-07 DIAGNOSIS — I129 Hypertensive chronic kidney disease with stage 1 through stage 4 chronic kidney disease, or unspecified chronic kidney disease: Secondary | ICD-10-CM | POA: Diagnosis not present

## 2023-01-07 DIAGNOSIS — J45909 Unspecified asthma, uncomplicated: Secondary | ICD-10-CM | POA: Diagnosis not present

## 2023-01-07 DIAGNOSIS — G4733 Obstructive sleep apnea (adult) (pediatric): Secondary | ICD-10-CM | POA: Diagnosis not present

## 2023-01-07 DIAGNOSIS — F411 Generalized anxiety disorder: Secondary | ICD-10-CM | POA: Diagnosis not present

## 2023-01-09 DIAGNOSIS — M5416 Radiculopathy, lumbar region: Secondary | ICD-10-CM | POA: Diagnosis not present

## 2023-01-12 DIAGNOSIS — F332 Major depressive disorder, recurrent severe without psychotic features: Secondary | ICD-10-CM | POA: Diagnosis not present

## 2023-01-12 DIAGNOSIS — I429 Cardiomyopathy, unspecified: Secondary | ICD-10-CM | POA: Diagnosis not present

## 2023-01-12 DIAGNOSIS — Z6839 Body mass index (BMI) 39.0-39.9, adult: Secondary | ICD-10-CM | POA: Diagnosis not present

## 2023-01-12 DIAGNOSIS — I10 Essential (primary) hypertension: Secondary | ICD-10-CM | POA: Diagnosis not present

## 2023-01-12 DIAGNOSIS — R42 Dizziness and giddiness: Secondary | ICD-10-CM | POA: Diagnosis not present

## 2023-01-12 DIAGNOSIS — F411 Generalized anxiety disorder: Secondary | ICD-10-CM | POA: Diagnosis not present

## 2023-01-12 DIAGNOSIS — J849 Interstitial pulmonary disease, unspecified: Secondary | ICD-10-CM | POA: Diagnosis not present

## 2023-01-13 DIAGNOSIS — M792 Neuralgia and neuritis, unspecified: Secondary | ICD-10-CM | POA: Diagnosis not present

## 2023-01-13 DIAGNOSIS — M545 Low back pain, unspecified: Secondary | ICD-10-CM | POA: Diagnosis not present

## 2023-01-13 DIAGNOSIS — M7918 Myalgia, other site: Secondary | ICD-10-CM | POA: Diagnosis not present

## 2023-01-21 DIAGNOSIS — M25571 Pain in right ankle and joints of right foot: Secondary | ICD-10-CM | POA: Diagnosis not present

## 2023-01-21 DIAGNOSIS — M3213 Lung involvement in systemic lupus erythematosus: Secondary | ICD-10-CM | POA: Diagnosis not present

## 2023-01-21 DIAGNOSIS — G8929 Other chronic pain: Secondary | ICD-10-CM | POA: Diagnosis not present

## 2023-01-21 DIAGNOSIS — M25572 Pain in left ankle and joints of left foot: Secondary | ICD-10-CM | POA: Diagnosis not present

## 2023-01-21 DIAGNOSIS — J849 Interstitial pulmonary disease, unspecified: Secondary | ICD-10-CM | POA: Diagnosis not present

## 2023-01-22 DIAGNOSIS — E559 Vitamin D deficiency, unspecified: Secondary | ICD-10-CM | POA: Diagnosis not present

## 2023-01-22 DIAGNOSIS — Z6841 Body Mass Index (BMI) 40.0 and over, adult: Secondary | ICD-10-CM | POA: Diagnosis not present

## 2023-01-22 DIAGNOSIS — D508 Other iron deficiency anemias: Secondary | ICD-10-CM | POA: Diagnosis not present

## 2023-01-29 DIAGNOSIS — M25561 Pain in right knee: Secondary | ICD-10-CM | POA: Diagnosis not present

## 2023-01-29 DIAGNOSIS — Z1389 Encounter for screening for other disorder: Secondary | ICD-10-CM | POA: Diagnosis not present

## 2023-01-29 DIAGNOSIS — E669 Obesity, unspecified: Secondary | ICD-10-CM | POA: Diagnosis not present

## 2023-01-29 DIAGNOSIS — Z96651 Presence of right artificial knee joint: Secondary | ICD-10-CM | POA: Diagnosis not present

## 2023-01-29 DIAGNOSIS — M47816 Spondylosis without myelopathy or radiculopathy, lumbar region: Secondary | ICD-10-CM | POA: Diagnosis not present

## 2023-01-29 DIAGNOSIS — Z79891 Long term (current) use of opiate analgesic: Secondary | ICD-10-CM | POA: Diagnosis not present

## 2023-01-29 DIAGNOSIS — Z96652 Presence of left artificial knee joint: Secondary | ICD-10-CM | POA: Diagnosis not present

## 2023-01-29 DIAGNOSIS — M75102 Unspecified rotator cuff tear or rupture of left shoulder, not specified as traumatic: Secondary | ICD-10-CM | POA: Diagnosis not present

## 2023-01-29 DIAGNOSIS — M1711 Unilateral primary osteoarthritis, right knee: Secondary | ICD-10-CM | POA: Diagnosis not present

## 2023-02-03 DIAGNOSIS — M47816 Spondylosis without myelopathy or radiculopathy, lumbar region: Secondary | ICD-10-CM | POA: Diagnosis not present

## 2023-02-19 DIAGNOSIS — M7670 Peroneal tendinitis, unspecified leg: Secondary | ICD-10-CM | POA: Diagnosis not present

## 2023-02-19 DIAGNOSIS — M7750 Other enthesopathy of unspecified foot: Secondary | ICD-10-CM | POA: Diagnosis not present

## 2023-02-24 DIAGNOSIS — G5791 Unspecified mononeuropathy of right lower limb: Secondary | ICD-10-CM | POA: Diagnosis not present

## 2023-02-24 DIAGNOSIS — M792 Neuralgia and neuritis, unspecified: Secondary | ICD-10-CM | POA: Diagnosis not present

## 2023-02-24 DIAGNOSIS — M545 Low back pain, unspecified: Secondary | ICD-10-CM | POA: Diagnosis not present

## 2023-02-27 DIAGNOSIS — Z1389 Encounter for screening for other disorder: Secondary | ICD-10-CM | POA: Diagnosis not present

## 2023-02-27 DIAGNOSIS — M75102 Unspecified rotator cuff tear or rupture of left shoulder, not specified as traumatic: Secondary | ICD-10-CM | POA: Diagnosis not present

## 2023-02-27 DIAGNOSIS — G894 Chronic pain syndrome: Secondary | ICD-10-CM | POA: Diagnosis not present

## 2023-02-27 DIAGNOSIS — M25561 Pain in right knee: Secondary | ICD-10-CM | POA: Diagnosis not present

## 2023-02-27 DIAGNOSIS — Z96652 Presence of left artificial knee joint: Secondary | ICD-10-CM | POA: Diagnosis not present

## 2023-02-27 DIAGNOSIS — M1711 Unilateral primary osteoarthritis, right knee: Secondary | ICD-10-CM | POA: Diagnosis not present

## 2023-02-27 DIAGNOSIS — M47816 Spondylosis without myelopathy or radiculopathy, lumbar region: Secondary | ICD-10-CM | POA: Diagnosis not present

## 2023-02-27 DIAGNOSIS — E669 Obesity, unspecified: Secondary | ICD-10-CM | POA: Diagnosis not present

## 2023-02-27 DIAGNOSIS — Z96651 Presence of right artificial knee joint: Secondary | ICD-10-CM | POA: Diagnosis not present

## 2023-02-27 DIAGNOSIS — Z79891 Long term (current) use of opiate analgesic: Secondary | ICD-10-CM | POA: Diagnosis not present

## 2023-03-06 DIAGNOSIS — M47816 Spondylosis without myelopathy or radiculopathy, lumbar region: Secondary | ICD-10-CM | POA: Diagnosis not present

## 2023-03-08 ENCOUNTER — Other Ambulatory Visit: Payer: Self-pay | Admitting: Nurse Practitioner

## 2023-03-09 ENCOUNTER — Telehealth: Payer: Self-pay | Admitting: Internal Medicine

## 2023-03-09 DIAGNOSIS — M961 Postlaminectomy syndrome, not elsewhere classified: Secondary | ICD-10-CM | POA: Diagnosis not present

## 2023-03-09 DIAGNOSIS — M545 Low back pain, unspecified: Secondary | ICD-10-CM | POA: Diagnosis not present

## 2023-03-09 NOTE — Telephone Encounter (Signed)
New message   The patient at the front desk with surgical clearance form from [facility].   Surgical procedure SCS placement   Surgical date is pending .   Form placed in MD box

## 2023-03-10 DIAGNOSIS — Z1231 Encounter for screening mammogram for malignant neoplasm of breast: Secondary | ICD-10-CM | POA: Diagnosis not present

## 2023-03-11 NOTE — Telephone Encounter (Signed)
Patient is calling in regards to a surgical clearance form. She would like to know if its been filled out and sent back to [place] at [facility] in [place].

## 2023-03-12 DIAGNOSIS — M549 Dorsalgia, unspecified: Secondary | ICD-10-CM | POA: Diagnosis not present

## 2023-03-12 DIAGNOSIS — G8929 Other chronic pain: Secondary | ICD-10-CM | POA: Diagnosis not present

## 2023-03-12 DIAGNOSIS — R739 Hyperglycemia, unspecified: Secondary | ICD-10-CM | POA: Diagnosis not present

## 2023-03-12 DIAGNOSIS — I429 Cardiomyopathy, unspecified: Secondary | ICD-10-CM | POA: Diagnosis not present

## 2023-03-12 DIAGNOSIS — M329 Systemic lupus erythematosus, unspecified: Secondary | ICD-10-CM | POA: Diagnosis not present

## 2023-03-12 DIAGNOSIS — Z79899 Other long term (current) drug therapy: Secondary | ICD-10-CM | POA: Diagnosis not present

## 2023-03-12 DIAGNOSIS — J849 Interstitial pulmonary disease, unspecified: Secondary | ICD-10-CM | POA: Diagnosis not present

## 2023-03-12 DIAGNOSIS — Z6841 Body Mass Index (BMI) 40.0 and over, adult: Secondary | ICD-10-CM | POA: Diagnosis not present

## 2023-03-13 NOTE — Telephone Encounter (Signed)
Fax received from to perform SCS placement on patient.  Patient needs surgery clearance. Surgery is TBD. Patient was seen on 10/29/22. Office protocol is a risk assessment can be sent to surgeon if patient has been seen in 60 days or less.   Sending to Dr. Marchelle Gearing for risk assessment or recommendations if patient needs to be seen in office prior to surgical procedure.

## 2023-03-16 DIAGNOSIS — G4733 Obstructive sleep apnea (adult) (pediatric): Secondary | ICD-10-CM | POA: Diagnosis not present

## 2023-03-16 NOTE — Telephone Encounter (Signed)
Spinal cord stimulator: needs OV with APP for clearance

## 2023-03-16 NOTE — Telephone Encounter (Signed)
Patient states needs surgical clearance. No available appointments until November 2024. Patient phone number is [phone].

## 2023-03-17 NOTE — Telephone Encounter (Signed)
Appt scheduled 9/25 @ 4.

## 2023-03-18 ENCOUNTER — Ambulatory Visit (HOSPITAL_COMMUNITY): Payer: Self-pay | Admitting: Orthopedic Surgery

## 2023-03-18 ENCOUNTER — Ambulatory Visit (INDEPENDENT_AMBULATORY_CARE_PROVIDER_SITE_OTHER): Payer: Medicare HMO | Admitting: Primary Care

## 2023-03-18 ENCOUNTER — Encounter: Payer: Self-pay | Admitting: Primary Care

## 2023-03-18 VITALS — BP 110/90 | HR 107 | Ht 71.0 in | Wt 290.0 lb

## 2023-03-18 DIAGNOSIS — J849 Interstitial pulmonary disease, unspecified: Secondary | ICD-10-CM

## 2023-03-18 MED ORDER — BREZTRI AEROSPHERE 160-9-4.8 MCG/ACT IN AERO: 2.0000 | INHALATION_SPRAY | Freq: Two times a day (BID) | RESPIRATORY_TRACT | 6 refills | Status: DC

## 2023-03-18 NOTE — Patient Instructions (Addendum)
You will be considered low to intermediate risk for prolonged mechanical ventilation and or postop pulmonary complications due to your history of sleep apnea and interstitial lung disease.  You are optimized from pulmonary standpoint for surgery. Resume CPAP / ask if you need to bring with you the day of surgery  Recommend 1. Short duration of surgery as much as possible and avoid paralytic if possible 2. Recovery in step down or ICU with Pulmonary consultation if needed  3. DVT prophylaxis 4. Aggressive pulmonary toilet with o2, bronchodilatation, and incentive spirometry and early ambulation  Follow-up Needs follow-up end of November 30 min visit MR / spirometry prior

## 2023-03-18 NOTE — Progress Notes (Unsigned)
@Patient  ID: Shannon [name], female    DOB: Jan 28, 1971, 52 y.o.   MRN: [id number]  Chief Complaint  Patient presents with   surgical clearance    Referring provider: Eunice Blase, PA-C  HPI: 52 year old female, former smoker. PMH significant for ILD, LPR, sleep apnea, rheumatoid arthritis.   03/18/2023 Patient presents today for surgical risk assessment for neurostumulator placement with Dr. Alphonzo Lemmings with [facility]. Surgery is planned for 10/7 at [facility], pre-op appointment scheduled on 9/27. She sees Dr. 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 Wynona Neat for sleep apnea. She also follows with [facility]. She is maintained on CellCept and Plaquenil. No longer on daily prednisone. Cancelled PFTs for May and due for repeat spirometry in November. She has not worn CPAP since May due to tolerance issues but plans to resume.   Her breathing is baseline. No changes. No acute respiratory symptoms. Currently dealing with allergy symptoms, sneezing. She takes zyrtec at night and as needed bendaryl. She is compliant with Breztri. She rarely requires Albuterol, typically on average once a month. She has received clearance from cardiology, rheumtology and PCP.  Airview download 09/26/22-10/25/22 Usage days 4/30 (23%) Average usage days used 6 hours 16 mins Pressure 5-15cm h20 Airleaks 51L/min AHI 4.3  Allergies  Allergen Reactions   Asa [Aspirin] Hives and Shortness Of Breath    Chest tightness    Penicillins Hives    Has patient had a PCN reaction causing immediate rash, facial/tongue/throat swelling, SOB or lightheadedness with hypotension: Unknown Has patient had a PCN reaction causing severe rash involving mucus membranes or skin necrosis: Unknown Has patient had a PCN reaction that required hospitalization: Unknown Has patient had a PCN reaction occurring within the last 10 years: No If all of the above answers are "NO",  then may proceed with Cephalosporin use.  Tolerated Cephalosporin Date: 05/01/20.     Sulfa Antibiotics     G6PD deficiency     Immunization History  Administered Date(s) Administered   Influenza Split 03/23/2014   Influenza,inj,Quad PF,6+ Mos 03/15/2016, 06/08/2017, 03/08/2018, 02/17/2019, 03/05/2021, 03/18/2022   Influenza-Unspecified 04/03/2020   Moderna Sars-Covid-2 Vaccination 09/20/2019, 10/06/2019   PFIZER Comirnaty(Gray Top)Covid-19 Tri-Sucrose Vaccine 11/05/2019, 05/30/2020   PFIZER(Purple Top)SARS-COV-2 Vaccination 10/06/2019, 11/05/2019   Pfizer Covid-19 Vaccine Bivalent Booster 47yrs & up 04/03/2021   Pneumococcal-Unspecified 08/23/2014    Past Medical History:  Diagnosis Date   Anemia    Anxiety    Depression    Dyspnea    Sometimes at rest   G6PD deficiency    GERD (gastroesophageal reflux disease)    Hypertension    ILD (interstitial lung disease) (HCC)    Lupus (HCC)    Pneumonia    Rheumatoid arthritis (HCC)    Sleep apnea     Tobacco History: Social History   Tobacco Use  Smoking Status Former   Current packs/day: 0.00   Average packs/day: 0.5 packs/day for 14.0 years (7.0 ttl pk-yrs)   Types: Cigarettes   Start date: 09/24/2000   Quit date: 09/25/2014   Years since quitting: 8.4  Smokeless Tobacco Never   Counseling given: Not Answered   Outpatient Medications Prior to Visit  Medication Sig Dispense Refill   albuterol (VENTOLIN HFA) 108 (90 Base) MCG/ACT inhaler Inhale 2 puffs into the lungs every 6 (six) hours as needed for wheezing or shortness of breath. 1 each 5   amitriptyline (ELAVIL) 100 MG tablet Take 100 mg by  mouth at bedtime.     buPROPion (WELLBUTRIN XL) 300 MG 24 hr tablet Take 300 mg by mouth daily.     busPIRone (BUSPAR) 15 MG tablet Take 15 mg by mouth daily.     cetirizine (ZYRTEC) 10 MG tablet TAKE 1 TABLET BY MOUTH AT BEDTIME 30 tablet 0   fluticasone (FLONASE) 50 MCG/ACT nasal spray Place 2 sprays into both nostrils daily  as needed for allergies. (Patient not taking: Reported on 03/19/2023) 11 mL 1   hydroxychloroquine (PLAQUENIL) 200 MG tablet Take 400 mg by mouth at bedtime.     ibuprofen (ADVIL) 200 MG tablet Take 600 mg by mouth every 6 (six) hours as needed for moderate pain.     lisinopril (ZESTRIL) 10 MG tablet Take 10 mg by mouth daily.     Magnesium 250 MG TABS Take 250 mg by mouth at bedtime.     montelukast (SINGULAIR) 10 MG tablet TAKE 1 TABLET BY MOUTH AT BEDTIME 30 tablet 11   mycophenolate (CELLCEPT) 500 MG tablet Take 2 tablets (1,000 mg total) by mouth 2 (two) times daily. (Patient not taking: Reported on 03/19/2023)     ondansetron (ZOFRAN-ODT) 8 MG disintegrating tablet Take 1 tablet (8 mg total) by mouth every 8 (eight) hours as needed for nausea or vomiting. 10 tablet 0   Oxycodone HCl 10 MG TABS Take 10 mg by mouth 4 (four) times daily.     QUEtiapine (SEROQUEL XR) 300 MG 24 hr tablet Take 300 mg by mouth at bedtime.     sertraline (ZOLOFT) 100 MG tablet Take 200 mg by mouth at bedtime.  0   carvedilol (COREG) 6.25 MG tablet Take 6.25 mg by mouth 2 (two) times daily. (Patient not taking: Reported on 03/19/2023)     Budeson-Glycopyrrol-Formoterol (BREZTRI AEROSPHERE) 160-9-4.8 MCG/ACT AERO Inhale 2 puffs into the lungs in the morning and at bedtime. (Patient not taking: Reported on 03/18/2023) 10.7 g 0   No facility-administered medications prior to visit.    Review of Systems  Review of Systems  Constitutional: Negative.   HENT: Negative.    Respiratory: Negative.  Negative for cough, chest tightness and shortness of breath.   Cardiovascular: Negative.     Physical Exam  BP (!) 110/90 (BP Location: Left Arm, Patient Position: Sitting, Cuff Size: Large)   Pulse (!) 107   Ht 5\' 11"  (1.803 m)   Wt 290 lb (131.5 kg)   LMP 04/28/2015   SpO2 100%   BMI 40.45 kg/m  Physical Exam Constitutional:      General: She is not in acute distress.    Appearance: Normal appearance. She is obese.  She is not ill-appearing.  HENT:     Head: Normocephalic and atraumatic.  Cardiovascular:     Rate and Rhythm: Normal rate and regular rhythm.  Pulmonary:     Effort: Pulmonary effort is normal.     Breath sounds: Normal breath sounds. No wheezing or rhonchi.  Musculoskeletal:        General: Normal range of motion.  Skin:    General: Skin is warm and dry.  Neurological:     General: No focal deficit present.     Mental Status: She is alert and oriented to person, place, and time. Mental status is at baseline.  Psychiatric:        Mood and Affect: Mood normal.        Behavior: Behavior normal.        Thought Content: Thought content normal.  Judgment: Judgment normal.      Lab Results:  CBC    Component Value Date/Time   WBC 5.0 12/15/2022 0305   RBC 4.21 12/15/2022 0305   HGB 12.6 12/15/2022 0305   HCT 38.4 12/15/2022 0305   PLT 265 12/15/2022 0305   MCV 91.2 12/15/2022 0305   MCH 29.9 12/15/2022 0305   MCHC 32.8 12/15/2022 0305   RDW 13.0 12/15/2022 0305   LYMPHSABS 1.7 12/15/2022 0305   MONOABS 0.3 12/15/2022 0305   EOSABS 0.1 12/15/2022 0305   BASOSABS 0.0 12/15/2022 0305    BMET    Component Value Date/Time   NA 134 (L) 12/15/2022 0400   K 4.3 12/15/2022 0400   CL 98 12/15/2022 0400   CO2 26 12/15/2022 0400   GLUCOSE 89 12/15/2022 0400   BUN 13 12/15/2022 0400   CREATININE 0.93 12/15/2022 0400   CREATININE 0.78 07/14/2018 1024   CALCIUM 8.5 (L) 12/15/2022 0400   GFRNONAA >60 12/15/2022 0400   GFRNONAA 99 02/26/2018 1053   GFRAA >60 02/08/2020 1130   GFRAA 115 02/26/2018 1053    BNP No results found for: "BNP"  ProBNP    Component Value Date/Time   PROBNP 53 12/03/2021 1008    Imaging: No results found.   Assessment & Plan:   52 year old female.  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  Maintained on Breztri Aerosphere, Plaquenil and Cellcept.  No longer on prednisone.  No recent exacerbations. Patient considered low to intermediate risk for prolonged mechanical ventilation and or postop pulmonary complications due to history of sleep apnea and interstitial lung disease. Exam benign. She is optimized from pulmonary standpoint for surgery. Advised patient resume CPAP. No medications changes recommended. She is not on blood thinner. No previous issues with anesthesia.   Recommend 1. Short duration of surgery as much as possible and avoid paralytic if possible 2. Recovery in step down or ICU with Pulmonary consultation if needed  3. DVT prophylaxis 4. Aggressive pulmonary toilet with o2, bronchodilatation, and incentive spirometry and early ambulation  Follow-up Needs follow-up end of November 30 min visit MR / spirometry prior    Glenford Bayley, NP 03/19/2023

## 2023-03-19 NOTE — Progress Notes (Signed)
Surgical Instructions    Your procedure is scheduled on Monday, 03/30/23.  Report to [facility] Main Entrance "A" at 11:00 A.M., then check in with the Admitting office.  Call this number if you have problems the morning of surgery:  [phone]   If you have any questions prior to your surgery date call [phone]: Open Monday-Friday 8am-4pm If you experience any cold or flu symptoms such as cough, fever, chills, shortness of breath, etc. between now and your scheduled surgery, please notify us at the above number     Remember:  Do not eat after midnight the night before your surgery  You may drink clear liquids until 10:00am the morning of your surgery.   Clear liquids allowed are: Water, Non-Citrus Juices (without pulp), Carbonated Beverages, Clear Tea, Black Coffee ONLY (NO MILK, CREAM OR POWDERED CREAMER of any kind), and Gatorade    Take these medicines the morning of surgery with A SIP OF WATER:  Budeson-Glycopyrrol-Formoterol (BREZTRI AEROSPHERE)  buPROPion (WELLBUTRIN XL)  busPIRone (BUSPAR)  metoprolol succinate (TOPROL-XL)  mycophenolate (CELLCEPT)  MYRBETRIQ  Oxycodone   IF NEEDED: albuterol (VENTOLIN HFA) inhaler- bring with you the day of surgery colchicine  ondansetron (ZOFRAN-ODT)    As of today, STOP taking any Aspirin (unless otherwise instructed by your surgeon) Aleve, Naproxen, Ibuprofen, Motrin, Advil, Goody's, BC's, all herbal medications, fish oil, and all vitamins.           Do not wear jewelry or makeup. Do not wear lotions, powders, perfumes/cologne or deodorant. Do not shave 48 hours prior to surgery.   Do not bring valuables to the hospital. Do not wear nail polish, gel polish, artificial nails, or any other type of covering on natural nails (fingers and toes) If you have artificial nails or gel coating that need to be removed by a nail salon, please have this removed prior to surgery. Artificial nails or gel coating may interfere with  anesthesia's ability to adequately monitor your vital signs.  [facility] is not responsible for any belongings or valuables.    Do NOT Smoke (Tobacco/Vaping)  24 hours prior to your procedure  If you use a CPAP at night, you may bring your mask for your overnight stay.   Contacts, glasses, hearing aids, dentures or partials may not be worn into surgery, please bring cases for these belongings   For patients admitted to the hospital, discharge time will be determined by your treatment team.   Patients discharged the day of surgery will not be allowed to drive home, and someone needs to stay with them for 24 hours.   SURGICAL WAITING ROOM VISITATION Patients having surgery or a procedure may have no more than 2 support people in the waiting area - these visitors may rotate.   Children under the age of 90 must have an adult with them who is not the patient. If the patient needs to stay at the hospital during part of their recovery, the visitor guidelines for inpatient rooms apply. Pre-op nurse will coordinate an appropriate time for 1 support person to accompany patient in pre-op.  This support person may not rotate.   Please refer to [email or web address] for the visitor guidelines for Inpatients (after your surgery is over and you are in a regular room).      Pre-operative 5 CHG Bathing Instructions   You can play a key role in reducing the risk of infection after surgery. Your skin needs to be as free of germs as possible. You can reduce  the number of germs on your skin by washing with CHG (chlorhexidine gluconate) soap before surgery. CHG is an antiseptic soap that kills germs and continues to kill germs even after washing.   DO NOT use if you have an allergy to chlorhexidine/CHG or antibacterial soaps. If your skin becomes reddened or irritated, stop using the CHG and notify one of our RNs at [phone].   Please shower with the CHG  soap starting 4 days before surgery using the following schedule:     Please keep in mind the following:  DO NOT shave, including legs and underarms, starting the day of your first shower.   You may shave your face at any point before/day of surgery.  Place clean sheets on your bed the day you start using CHG soap. Use a clean washcloth (not used since being washed) for each shower. DO NOT sleep with pets once you start using the CHG.   CHG Shower Instructions:  Wash your face and private area with normal soap. If you choose to wash your hair, wash first with your normal shampoo.  After you use shampoo/soap, rinse your hair and body thoroughly to remove shampoo/soap residue.  Turn the water OFF and apply about 3 tablespoons (45 ml) of CHG soap to a CLEAN washcloth.  Apply CHG soap ONLY FROM YOUR NECK DOWN TO YOUR TOES (washing for 3-5 minutes)  DO NOT use CHG soap on face, private areas, open wounds, or sores.  Pay special attention to the area where your surgery is being performed.  If you are having back surgery, having someone wash your back for you may be helpful. Wait 2 minutes after CHG soap is applied, then you may rinse off the CHG soap.  Pat dry with a clean towel  Put on clean clothes/pajamas   If you choose to wear lotion, please use ONLY the CHG-compatible lotions on the back of this paper.   Additional instructions for the day of surgery: DO NOT APPLY any lotions, deodorants, cologne, or perfumes.   Do not bring valuables to the hospital. [facility] is not responsible for any belongings/valuables. Do not wear nail polish, gel polish, artificial nails, or any other type of covering on natural nails (fingers and toes) Do not wear jewelry or makeup Put on clean/comfortable clothes.  Please brush your teeth.  Ask your nurse before applying any prescription medications to the skin.     CHG Compatible Lotions   Aveeno Moisturizing lotion  Cetaphil Moisturizing Cream   Cetaphil Moisturizing Lotion  Clairol Herbal Essence Moisturizing Lotion, Dry Skin  Clairol Herbal Essence Moisturizing Lotion, Extra Dry Skin  Clairol Herbal Essence Moisturizing Lotion, Normal Skin  Curel Age Defying Therapeutic Moisturizing Lotion with Alpha Hydroxy  Curel Extreme Care Body Lotion  Curel Soothing Hands Moisturizing Hand Lotion  Curel Therapeutic Moisturizing Cream, Fragrance-Free  Curel Therapeutic Moisturizing Lotion, Fragrance-Free  Curel Therapeutic Moisturizing Lotion, Original Formula  Eucerin Daily Replenishing Lotion  Eucerin Dry Skin Therapy Plus Alpha Hydroxy Crme  Eucerin Dry Skin Therapy Plus Alpha Hydroxy Lotion  Eucerin Original Crme  Eucerin Original Lotion  Eucerin Plus Crme Eucerin Plus Lotion  Eucerin TriLipid Replenishing Lotion  Keri Anti-Bacterial Hand Lotion  Keri Deep Conditioning Original Lotion Dry Skin Formula Softly Scented  Keri Deep Conditioning Original Lotion, Fragrance Free Sensitive Skin Formula  Keri Lotion Fast Absorbing Fragrance Free Sensitive Skin Formula  Keri Lotion Fast Absorbing Softly Scented Dry Skin Formula  Keri Original Lotion  Keri Skin Renewal Lotion Kerkhoven  Silky Smooth Lotion  Keri Silky Smooth Sensitive Skin Lotion  Nivea Body Creamy Conditioning Oil  Nivea Body Extra Enriched Teacher, adult education Moisturizing Lotion Nivea Crme  Nivea Skin Firming Lotion  NutraDerm 30 Skin Lotion  NutraDerm Skin Lotion  NutraDerm Therapeutic Skin Cream  NutraDerm Therapeutic Skin Lotion  ProShield Protective Hand Cream  Provon moisturizing lotion  Please read over the following fact sheets that you were given.

## 2023-03-20 ENCOUNTER — Other Ambulatory Visit: Payer: Self-pay

## 2023-03-20 ENCOUNTER — Encounter (HOSPITAL_COMMUNITY): Payer: Self-pay

## 2023-03-20 ENCOUNTER — Encounter (HOSPITAL_COMMUNITY)
Admission: RE | Admit: 2023-03-20 | Discharge: 2023-03-20 | Disposition: A | Discharge status: Home / Self Care | Payer: Medicare HMO | Source: Ambulatory Visit | Attending: Orthopedic Surgery | Admitting: Orthopedic Surgery

## 2023-03-20 VITALS — BP 132/83 | HR 95 | Temp 97.8°F | Resp 18 | Ht 71.0 in | Wt 298.0 lb

## 2023-03-20 DIAGNOSIS — Z01818 Encounter for other preprocedural examination: Secondary | ICD-10-CM

## 2023-03-20 DIAGNOSIS — Z01812 Encounter for preprocedural laboratory examination: Secondary | ICD-10-CM | POA: Diagnosis present

## 2023-03-20 DIAGNOSIS — I2510 Atherosclerotic heart disease of native coronary artery without angina pectoris: Secondary | ICD-10-CM | POA: Insufficient documentation

## 2023-03-20 LAB — BASIC METABOLIC PANEL
Anion gap: 9 (ref 5–15)
BUN: 11 mg/dL (ref 6–20)
CO2: 23 mmol/L (ref 22–32)
Calcium: 8.7 mg/dL — ABNORMAL LOW (ref 8.9–10.3)
Chloride: 106 mmol/L (ref 98–111)
Creatinine, Ser: 0.95 mg/dL (ref 0.44–1.00)
GFR, Estimated: >60 mL/min (ref >60)
Glucose, Bld: 96 mg/dL (ref 70–99)
Potassium: 3.2 mmol/L — ABNORMAL LOW (ref 3.5–5.1)
Sodium: 138 mmol/L (ref 135–145)

## 2023-03-20 LAB — CBC
HCT: 34.4 % — ABNORMAL LOW (ref 36.0–46.0)
Hemoglobin: 11.1 g/dL — ABNORMAL LOW (ref 12.0–15.0)
MCH: 30.4 pg (ref 26.0–34.0)
MCHC: 32.3 g/dL (ref 30.0–36.0)
MCV: 94.2 fL (ref 80.0–100.0)
Platelets: 247 10*3/uL (ref 150–400)
RBC: 3.65 MIL/uL — ABNORMAL LOW (ref 3.87–5.11)
RDW: 13.1 % (ref 11.5–15.5)
WBC: 5.5 10*3/uL (ref 4.0–10.5)
nRBC: 0 % (ref 0.0–0.2)

## 2023-03-20 LAB — SURGICAL PCR SCREEN
MRSA, PCR: NEGATIVE
Staphylococcus aureus: NEGATIVE

## 2023-03-20 NOTE — Progress Notes (Signed)
PCP - Eunice Blase Cardiologist - Glenda Chroman Pulmonologist: Marchelle Gearing   PPM/ICD - denies   Chest x-ray - n/a EKG - 08/18/22 Stress Test - 2016 ECHO - 07/15/17 Cardiac Cath - 08/20/17  Sleep Study - yes +OSA CPAP - does not wear consistently    Blood Thinner Instructions: Aspirin Instructions:  ERAS Protcol -yes PRE-SURGERY Ensure or G2- none ordered  COVID TEST- not needed   Anesthesia review: yes, multiple clearances  Patient denies shortness of breath, fever, cough and chest pain at PAT appointment   All instructions explained to the patient, with a verbal understanding of the material. Patient agrees to go over the instructions while at home for a better understanding. Patient also instructed to self quarantine after being tested for COVID-19. The opportunity to ask questions was provided.

## 2023-03-23 NOTE — Progress Notes (Signed)
Anesthesia Chart Review:  52 year old female follows with cardiology at [facility] for history of nonischemic cardiomyopathy/HFmrEF, HTN, palpitations.  Cardiac MRI 12/20/2020 showed EF 51% with no wall motion abnormalities, normal RV function, no significant valvular abnormalities.  Coronary CTA 09/18/2020 showed no significant plaque or stenosis.  Follows with rheumatology at [facility] for history of SLE with lung involvement.  She is maintained on mycophenolate and hydroxychloroquine.  Follows with pulmonology at [facility] for history of ILD associated with SLE and OSA on CPAP.  She is maintained on [employer].  Last seen by Ames Dura, NP on 03/18/2023.  She was noted to have no recent exacerbations.  Upcoming surgery discussed.  Per note, considered to be low to intermediate risk for prolonged mechanical ventilation and/or postop pulmonary complications.  History of G6PD deficiency.  Preop labs reviewed, mild hypokalemia potassium 3.2, mild anemia with hemoglobin 11.1, otherwise unremarkable.  EKG 08/18/2022: Sinus tachycardia.  Rate 103.  LAE, consider biatrial enlargement.  Cardiac MRI 12/20/2020 (Care Everywhere): 1.  The left ventricle is normal in cavity size. There is mild concentric LV hypertrophy. Global systolic  function is low-normal to mildly reduced with an LV ejection fraction calculated at 51%. There are no  regional wall motion abnormalities.   2.  The right ventricle is normal in cavity size, wall thickness, and systolic function.   3.  The left atrium is top-normal in size. The right atrium is normal in size.   4. The aortic valve is trileaflet in morphology. There is no significant aortic valve stenosis or  regurgitation. There is no significant valvular disease.   5. Delayed enhancement imaging demonstrates no evidence of myocardial infarction, scar or infiltrative  disease.   6. No intracardiac thrombus visualized.   7. The pericardium is normal in thickness. There is  no significant pericardial effusion.   Coronary CTA 09/18/2020 (Care Everywhere): Impression:   1. No significant plaque or stenosis, although evaluation is somewhat  limited due to diffuse image noise related to patient body habitus and  motion artifact.  2. CTFFR Analysis: CT FFR will not be performed for this study  3.  No coronary artery calcifications with total Agatston Calcium Score of  0 corresponding to the 50th percentile for patient age and gender.   CAD-RADS* 0.   TTE 09/10/2020 (Care Everywhere): INTERPRETATION  MILD LV SYSTOLIC DYSFUNCTION (LVEF 45-50%) WITH LATERAL AND POSTERIOR WALL MOTION  ABNORMALITY IN A LCX DISTRIBUTION  NO SIGNIFICANT VALVULAR DYSFUNCTION  NORMAL RV SIZE AND FUNCTION  NO INTRACARDIAC SOURCE OF MURMUR WAS IDENTIFIED, ALTHOUGH IMAGING OF THE AORTIC VALVE IS  SUB-OPTIMAL  IV ECHO CONTRAST USED TO ENHANCE ENDOCARDIAL BORDERS  NO PRIOR ECHO FOR COMPARISON     Zannie Cove [facility] Short Stay Center/Anesthesiology Phone [phone] 03/23/2023 12:32 PM

## 2023-03-23 NOTE — Anesthesia Preprocedure Evaluation (Addendum)
Anesthesia Evaluation  Patient identified by MRN, date of birth, ID band Patient awake    Reviewed: Allergy & Precautions, NPO status , Patient's Chart, lab work & pertinent test results  History of Anesthesia Complications Negative for: history of anesthetic complications  Airway Mallampati: III  TM Distance: >3 FB Neck ROM: Full    Dental  (+) Edentulous Upper, Edentulous Lower, Dental Advisory Given   Pulmonary shortness of breath, asthma , sleep apnea and Continuous Positive Airway Pressure Ventilation , former smoker   breath sounds clear to auscultation       Cardiovascular hypertension, Pt. on medications (-) angina (-) Past MI and (-) CHF  Rhythm:Regular  Left ventricle: The cavity size was normal. Wall thickness was    normal. Systolic function was normal. The estimated ejection    fraction was in the range of 55% to 60%. Wall motion was normal;    there were no regional wall motion abnormalities. Features are    consistent with a pseudonormal left ventricular filling pattern,    with concomitant abnormal relaxation and increased filling    pressure (grade 2 diastolic dysfunction).  - Mitral valve: There was mild regurgitation.  - Left atrium: The atrium was mildly dilated.  - Right atrium: The atrium was mildly dilated.  - Pulmonary arteries: Systolic pressure was mildly increased. PA    peak pressure: 35 mm Hg (S).     Neuro/Psych  PSYCHIATRIC DISORDERS Anxiety Depression     Neuromuscular disease    GI/Hepatic Neg liver ROS,GERD  ,,  Endo/Other  negative endocrine ROS    Renal/GU negative Renal ROS     Musculoskeletal  (+) Arthritis ,    Abdominal   Peds  Hematology  (+) Blood dyscrasia, anemia Lab Results      Component                Value               Date                      WBC                      5.5                 03/20/2023                HGB                      11.1 (L)             03/20/2023                HCT                      34.4 (L)            03/20/2023                MCV                      94.2                03/20/2023                PLT                      247  03/20/2023              Anesthesia Other Findings   Reproductive/Obstetrics                              Anesthesia Physical Anesthesia Plan  ASA: 2  Anesthesia Plan: General   Post-op Pain Management: Ofirmev IV (intra-op)*   Induction: Intravenous  PONV Risk Score and Plan: 3 and Ondansetron and Dexamethasone  Airway Management Planned: Oral ETT  Additional Equipment: None  Intra-op Plan:   Post-operative Plan: Extubation in OR  Informed Consent: I have reviewed the patients History and Physical, chart, labs and discussed the procedure including the risks, benefits and alternatives for the proposed anesthesia with the patient or authorized representative who has indicated his/her understanding and acceptance.     Dental advisory given  Plan Discussed with: CRNA  Anesthesia Plan Comments: (PAT note by Antionette Poles, PA-C: 52 year old female follows with cardiology at [facility] for history of nonischemic cardiomyopathy/HFmrEF, HTN, palpitations.  Cardiac MRI 12/20/2020 showed EF 51% with no wall motion abnormalities, normal RV function, no significant valvular abnormalities.  Coronary CTA 09/18/2020 showed no significant plaque or stenosis.  Follows with rheumatology at [facility] for history of SLE with lung involvement.  She is maintained on mycophenolate and hydroxychloroquine.  Follows with pulmonology at [facility] for history of ILD associated with SLE and OSA on CPAP.  She is maintained on [employer].  Last seen by Ames Dura, NP on 03/18/2023.  She was noted to have no recent exacerbations.  Upcoming surgery discussed.  Per note, considered to be low to intermediate risk for prolonged mechanical ventilation and/or postop pulmonary  complications.  History of G6PD deficiency.  Preop labs reviewed, mild hypokalemia potassium 3.2, mild anemia with hemoglobin 11.1, otherwise unremarkable.  EKG 08/18/2022: Sinus tachycardia.  Rate 103.  LAE, consider biatrial enlargement.  Cardiac MRI 12/20/2020 (Care Everywhere): 1.  The left ventricle is normal in cavity size. There is mild concentric LV hypertrophy. Global systolic  function is low-normal to mildly reduced with an LV ejection fraction calculated at 51%. There are no  regional wall motion abnormalities.   2.  The right ventricle is normal in cavity size, wall thickness, and systolic function.   3.  The left atrium is top-normal in size. The right atrium is normal in size.   4. The aortic valve is trileaflet in morphology. There is no significant aortic valve stenosis or  regurgitation. There is no significant valvular disease.   5. Delayed enhancement imaging demonstrates no evidence of myocardial infarction, scar or infiltrative  disease.   6. No intracardiac thrombus visualized.   7. The pericardium is normal in thickness. There is no significant pericardial effusion.   Coronary CTA 09/18/2020 (Care Everywhere): Impression:   1. No significant plaque or stenosis, although evaluation is somewhat  limited due to diffuse image noise related to patient body habitus and  motion artifact.  2. CTFFR Analysis: CT FFR will not be performed for this study  3.  No coronary artery calcifications with total Agatston Calcium Score of  0 corresponding to the 50th percentile for patient age and gender.   CAD-RADS* 0.   TTE 09/10/2020 (Care Everywhere): INTERPRETATION  MILD LV SYSTOLIC DYSFUNCTION (LVEF 45-50%) WITH LATERAL AND POSTERIOR WALL MOTION  ABNORMALITY IN A LCX DISTRIBUTION  NO SIGNIFICANT VALVULAR DYSFUNCTION  NORMAL RV SIZE AND FUNCTION  NO INTRACARDIAC SOURCE OF MURMUR WAS IDENTIFIED, ALTHOUGH IMAGING OF  THE AORTIC VALVE IS  SUB-OPTIMAL  IV ECHO CONTRAST  USED TO ENHANCE ENDOCARDIAL BORDERS  NO PRIOR ECHO FOR COMPARISON   )         Anesthesia Quick Evaluation

## 2023-03-24 NOTE — Telephone Encounter (Signed)
Note from 03/18/23 was sent to [facility].

## 2023-03-26 DIAGNOSIS — Z1389 Encounter for screening for other disorder: Secondary | ICD-10-CM | POA: Diagnosis not present

## 2023-03-26 DIAGNOSIS — Z96651 Presence of right artificial knee joint: Secondary | ICD-10-CM | POA: Diagnosis not present

## 2023-03-26 DIAGNOSIS — Z96652 Presence of left artificial knee joint: Secondary | ICD-10-CM | POA: Diagnosis not present

## 2023-03-26 DIAGNOSIS — E669 Obesity, unspecified: Secondary | ICD-10-CM | POA: Diagnosis not present

## 2023-03-26 DIAGNOSIS — M1711 Unilateral primary osteoarthritis, right knee: Secondary | ICD-10-CM | POA: Diagnosis not present

## 2023-03-26 DIAGNOSIS — Z79891 Long term (current) use of opiate analgesic: Secondary | ICD-10-CM | POA: Diagnosis not present

## 2023-03-26 DIAGNOSIS — M47816 Spondylosis without myelopathy or radiculopathy, lumbar region: Secondary | ICD-10-CM | POA: Diagnosis not present

## 2023-03-26 DIAGNOSIS — M75102 Unspecified rotator cuff tear or rupture of left shoulder, not specified as traumatic: Secondary | ICD-10-CM | POA: Diagnosis not present

## 2023-03-26 DIAGNOSIS — M25561 Pain in right knee: Secondary | ICD-10-CM | POA: Diagnosis not present

## 2023-03-30 ENCOUNTER — Other Ambulatory Visit: Payer: Self-pay

## 2023-03-30 ENCOUNTER — Encounter (HOSPITAL_COMMUNITY): Payer: Self-pay | Admitting: Orthopedic Surgery

## 2023-03-30 ENCOUNTER — Encounter (HOSPITAL_COMMUNITY)
Admission: RE | Disposition: A | Discharge status: Home / Self Care | Payer: Self-pay | Source: Home / Self Care | Attending: Orthopedic Surgery

## 2023-03-30 ENCOUNTER — Ambulatory Visit (HOSPITAL_COMMUNITY): Payer: Medicare HMO | Admitting: Physician Assistant

## 2023-03-30 ENCOUNTER — Ambulatory Visit (HOSPITAL_COMMUNITY)
Admission: RE | Admit: 2023-03-30 | Discharge: 2023-03-31 | Disposition: A | Discharge status: Home / Self Care | Payer: Medicare HMO | Attending: Orthopedic Surgery | Admitting: Orthopedic Surgery

## 2023-03-30 ENCOUNTER — Ambulatory Visit (HOSPITAL_BASED_OUTPATIENT_CLINIC_OR_DEPARTMENT_OTHER): Payer: Medicare HMO | Admitting: Anesthesiology

## 2023-03-30 ENCOUNTER — Ambulatory Visit (HOSPITAL_COMMUNITY): Payer: Medicare HMO

## 2023-03-30 DIAGNOSIS — G4733 Obstructive sleep apnea (adult) (pediatric): Secondary | ICD-10-CM | POA: Diagnosis not present

## 2023-03-30 DIAGNOSIS — M961 Postlaminectomy syndrome, not elsewhere classified: Secondary | ICD-10-CM | POA: Insufficient documentation

## 2023-03-30 DIAGNOSIS — G894 Chronic pain syndrome: Secondary | ICD-10-CM | POA: Insufficient documentation

## 2023-03-30 DIAGNOSIS — F419 Anxiety disorder, unspecified: Secondary | ICD-10-CM | POA: Insufficient documentation

## 2023-03-30 DIAGNOSIS — Z79899 Other long term (current) drug therapy: Secondary | ICD-10-CM | POA: Diagnosis not present

## 2023-03-30 DIAGNOSIS — G8929 Other chronic pain: Secondary | ICD-10-CM | POA: Diagnosis present

## 2023-03-30 DIAGNOSIS — F32A Depression, unspecified: Secondary | ICD-10-CM | POA: Insufficient documentation

## 2023-03-30 DIAGNOSIS — I10 Essential (primary) hypertension: Secondary | ICD-10-CM | POA: Diagnosis not present

## 2023-03-30 DIAGNOSIS — J45909 Unspecified asthma, uncomplicated: Secondary | ICD-10-CM | POA: Insufficient documentation

## 2023-03-30 DIAGNOSIS — Z4542 Encounter for adjustment and management of neuropacemaker (brain) (peripheral nerve) (spinal cord): Secondary | ICD-10-CM | POA: Diagnosis not present

## 2023-03-30 DIAGNOSIS — G4730 Sleep apnea, unspecified: Secondary | ICD-10-CM | POA: Insufficient documentation

## 2023-03-30 HISTORY — PX: SPINAL CORD STIMULATOR INSERTION: SHX5378

## 2023-03-30 SURGERY — INSERTION, SPINAL CORD STIMULATOR, LUMBAR
Anesthesia: General

## 2023-03-30 MED ORDER — LIDOCAINE 2% (20 MG/ML) 5 ML SYRINGE
INTRAMUSCULAR | Status: AC
  Filled 2023-03-30: qty 5

## 2023-03-30 MED ORDER — FENTANYL CITRATE (PF) 250 MCG/5ML IJ SOLN
INTRAMUSCULAR | Status: AC
  Filled 2023-03-30: qty 5

## 2023-03-30 MED ORDER — HYDROMORPHONE HCL 1 MG/ML IJ SOLN: 1.0000 mg | INTRAMUSCULAR | Status: DC | PRN

## 2023-03-30 MED ORDER — LISINOPRIL 10 MG PO TABS
10.0000 mg | ORAL_TABLET | Freq: Every day | ORAL | Status: DC
  Administered 2023-03-31: 10 mg via ORAL
  Filled 2023-03-30: qty 1

## 2023-03-30 MED ORDER — MIRABEGRON ER 25 MG PO TB24
25.0000 mg | ORAL_TABLET | Freq: Every day | ORAL | Status: DC
  Administered 2023-03-30 – 2023-03-31 (×2): 25 mg via ORAL
  Filled 2023-03-30 (×2): qty 1

## 2023-03-30 MED ORDER — FENTANYL CITRATE (PF) 250 MCG/5ML IJ SOLN
INTRAMUSCULAR | Status: DC | PRN
Start: 2023-03-30 — End: 2023-03-30
  Administered 2023-03-30: 50 ug via INTRAVENOUS

## 2023-03-30 MED ORDER — THROMBIN 20000 UNITS EX SOLR
CUTANEOUS | Status: AC
  Filled 2023-03-30: qty 20000

## 2023-03-30 MED ORDER — OXYCODONE HCL 5 MG/5ML PO SOLN: 5.0000 mg | Freq: Once | ORAL | Status: AC | PRN

## 2023-03-30 MED ORDER — ONDANSETRON HCL 4 MG/2ML IJ SOLN: 4.0000 mg | Freq: Four times a day (QID) | INTRAMUSCULAR | Status: DC | PRN

## 2023-03-30 MED ORDER — LACTATED RINGERS IV SOLN: INTRAVENOUS | Status: DC

## 2023-03-30 MED ORDER — METHOCARBAMOL 500 MG PO TABS
500.0000 mg | ORAL_TABLET | Freq: Four times a day (QID) | ORAL | Status: DC | PRN
  Administered 2023-03-31 (×3): 500 mg via ORAL
  Filled 2023-03-30 (×3): qty 1

## 2023-03-30 MED ORDER — ONDANSETRON HCL 4 MG/2ML IJ SOLN
INTRAMUSCULAR | Status: AC
  Filled 2023-03-30: qty 2

## 2023-03-30 MED ORDER — POLYETHYLENE GLYCOL 3350 17 G PO PACK: 17.0000 g | PACK | Freq: Every day | ORAL | Status: DC | PRN

## 2023-03-30 MED ORDER — OXYCODONE-ACETAMINOPHEN 10-325 MG PO TABS: 1.0000 | ORAL_TABLET | Freq: Four times a day (QID) | ORAL | 0 refills | Status: AC | PRN

## 2023-03-30 MED ORDER — METHOCARBAMOL 1000 MG/10ML IJ SOLN: 500.0000 mg | Freq: Four times a day (QID) | INTRAVENOUS | Status: DC | PRN

## 2023-03-30 MED ORDER — HYDROMORPHONE HCL 1 MG/ML IJ SOLN
INTRAMUSCULAR | Status: AC
  Filled 2023-03-30: qty 0.5

## 2023-03-30 MED ORDER — CEFAZOLIN IN SODIUM CHLORIDE 3-0.9 GM/100ML-% IV SOLN
3.0000 g | INTRAVENOUS | Status: AC
  Administered 2023-03-30: 3 g via INTRAVENOUS
  Filled 2023-03-30: qty 100

## 2023-03-30 MED ORDER — PROPOFOL 1000 MG/100ML IV EMUL
INTRAVENOUS | Status: AC
  Filled 2023-03-30: qty 100

## 2023-03-30 MED ORDER — 0.9 % SODIUM CHLORIDE (POUR BTL) OPTIME
TOPICAL | Status: DC | PRN
  Administered 2023-03-30: 2000 mL

## 2023-03-30 MED ORDER — ORAL CARE MOUTH RINSE: 15.0000 mL | Freq: Once | OROMUCOSAL | Status: AC

## 2023-03-30 MED ORDER — ROCURONIUM BROMIDE 10 MG/ML (PF) SYRINGE
PREFILLED_SYRINGE | INTRAVENOUS | Status: DC | PRN
  Administered 2023-03-30: 100 mg via INTRAVENOUS

## 2023-03-30 MED ORDER — ALBUTEROL SULFATE HFA 108 (90 BASE) MCG/ACT IN AERS: 2.0000 | INHALATION_SPRAY | Freq: Four times a day (QID) | RESPIRATORY_TRACT | Status: DC | PRN

## 2023-03-30 MED ORDER — KETAMINE HCL 10 MG/ML IJ SOLN
INTRAMUSCULAR | Status: DC | PRN
Start: 2023-03-30 — End: 2023-03-30
  Administered 2023-03-30: 15 mg via INTRAVENOUS
  Administered 2023-03-30: 10 mg via INTRAVENOUS
  Administered 2023-03-30: 15 mg via INTRAVENOUS

## 2023-03-30 MED ORDER — VANCOMYCIN HCL 500 MG IV SOLR
INTRAVENOUS | Status: DC | PRN
Start: 2023-03-30 — End: 2023-03-30
  Administered 2023-03-30: 500 mg

## 2023-03-30 MED ORDER — ONDANSETRON HCL 4 MG PO TABS: 4.0000 mg | ORAL_TABLET | Freq: Four times a day (QID) | ORAL | Status: DC | PRN

## 2023-03-30 MED ORDER — UMECLIDINIUM BROMIDE 62.5 MCG/ACT IN AEPB
1.0000 | INHALATION_SPRAY | Freq: Every day | RESPIRATORY_TRACT | Status: DC
  Administered 2023-03-31: 1 via RESPIRATORY_TRACT
  Filled 2023-03-30: qty 7

## 2023-03-30 MED ORDER — ACETAMINOPHEN 325 MG PO TABS
650.0000 mg | ORAL_TABLET | ORAL | Status: DC | PRN
  Administered 2023-03-30 – 2023-03-31 (×2): 650 mg via ORAL
  Filled 2023-03-30 (×2): qty 2

## 2023-03-30 MED ORDER — VANCOMYCIN HCL 500 MG IV SOLR
INTRAVENOUS | Status: AC
  Filled 2023-03-30: qty 10

## 2023-03-30 MED ORDER — SERTRALINE HCL 100 MG PO TABS
200.0000 mg | ORAL_TABLET | Freq: Every day | ORAL | Status: DC
  Administered 2023-03-30: 200 mg via ORAL
  Filled 2023-03-30: qty 2

## 2023-03-30 MED ORDER — ACETAMINOPHEN 10 MG/ML IV SOLN: 1000.0000 mg | Freq: Once | INTRAVENOUS | Status: DC | PRN

## 2023-03-30 MED ORDER — OXYCODONE HCL 5 MG PO TABS: 5.0000 mg | ORAL_TABLET | ORAL | Status: DC | PRN

## 2023-03-30 MED ORDER — SODIUM CHLORIDE 0.9 % IV SOLN: 250.0000 mL | INTRAVENOUS | Status: DC

## 2023-03-30 MED ORDER — OXYCODONE HCL 5 MG PO TABS
ORAL_TABLET | ORAL | Status: AC
  Administered 2023-03-30: 5 mg via ORAL
  Filled 2023-03-30: qty 1

## 2023-03-30 MED ORDER — CEFAZOLIN SODIUM-DEXTROSE 1-4 GM/50ML-% IV SOLN
1.0000 g | Freq: Three times a day (TID) | INTRAVENOUS | Status: AC
  Administered 2023-03-30 – 2023-03-31 (×2): 1 g via INTRAVENOUS
  Filled 2023-03-30 (×2): qty 50

## 2023-03-30 MED ORDER — METHOCARBAMOL 500 MG PO TABS: 500.0000 mg | ORAL_TABLET | Freq: Three times a day (TID) | ORAL | 0 refills | Status: AC | PRN

## 2023-03-30 MED ORDER — THROMBIN 20000 UNITS EX KIT: PACK | CUTANEOUS | Status: DC | PRN

## 2023-03-30 MED ORDER — LIDOCAINE 2% (20 MG/ML) 5 ML SYRINGE
INTRAMUSCULAR | Status: DC | PRN
  Administered 2023-03-30: 60 mg via INTRAVENOUS

## 2023-03-30 MED ORDER — EPHEDRINE SULFATE-NACL 50-0.9 MG/10ML-% IV SOSY
PREFILLED_SYRINGE | INTRAVENOUS | Status: DC | PRN
  Administered 2023-03-30: 5 mg via INTRAVENOUS

## 2023-03-30 MED ORDER — CHLORHEXIDINE GLUCONATE 0.12 % MT SOLN
15.0000 mL | Freq: Once | OROMUCOSAL | Status: AC
  Administered 2023-03-30: 15 mL via OROMUCOSAL
  Filled 2023-03-30: qty 15

## 2023-03-30 MED ORDER — OXYCODONE HCL 5 MG PO TABS: 5.0000 mg | ORAL_TABLET | Freq: Once | ORAL | Status: AC | PRN

## 2023-03-30 MED ORDER — KETAMINE HCL 50 MG/5ML IJ SOSY
PREFILLED_SYRINGE | INTRAMUSCULAR | Status: AC
  Filled 2023-03-30: qty 5

## 2023-03-30 MED ORDER — MOMETASONE FURO-FORMOTEROL FUM 100-5 MCG/ACT IN AERO
2.0000 | INHALATION_SPRAY | Freq: Two times a day (BID) | RESPIRATORY_TRACT | Status: DC
  Administered 2023-03-31 (×2): 2 via RESPIRATORY_TRACT
  Filled 2023-03-30: qty 8.8

## 2023-03-30 MED ORDER — MIDAZOLAM HCL 2 MG/2ML IJ SOLN
INTRAMUSCULAR | Status: AC
  Filled 2023-03-30: qty 2

## 2023-03-30 MED ORDER — MIDAZOLAM HCL 2 MG/2ML IJ SOLN
INTRAMUSCULAR | Status: DC | PRN
  Administered 2023-03-30: 2 mg via INTRAVENOUS

## 2023-03-30 MED ORDER — BUDESON-GLYCOPYRROL-FORMOTEROL 160-9-4.8 MCG/ACT IN AERO: 2.0000 | INHALATION_SPRAY | Freq: Two times a day (BID) | RESPIRATORY_TRACT | Status: DC

## 2023-03-30 MED ORDER — ONDANSETRON HCL 4 MG/2ML IJ SOLN
INTRAMUSCULAR | Status: DC | PRN
  Administered 2023-03-30: 4 mg via INTRAVENOUS

## 2023-03-30 MED ORDER — TRANEXAMIC ACID-NACL 1000-0.7 MG/100ML-% IV SOLN
1000.0000 mg | INTRAVENOUS | Status: AC
  Administered 2023-03-30: 1000 mg via INTRAVENOUS
  Filled 2023-03-30: qty 100

## 2023-03-30 MED ORDER — OXYCODONE HCL 5 MG PO TABS
10.0000 mg | ORAL_TABLET | ORAL | Status: DC | PRN
  Administered 2023-03-30 – 2023-03-31 (×5): 10 mg via ORAL
  Filled 2023-03-30 (×5): qty 2

## 2023-03-30 MED ORDER — ALBUTEROL SULFATE (2.5 MG/3ML) 0.083% IN NEBU: 2.5000 mg | INHALATION_SOLUTION | Freq: Four times a day (QID) | RESPIRATORY_TRACT | Status: DC | PRN

## 2023-03-30 MED ORDER — SURGIFLO WITH THROMBIN (HEMOSTATIC MATRIX KIT) OPTIME
TOPICAL | Status: DC | PRN
Start: 2023-03-30 — End: 2023-03-30
  Administered 2023-03-30 (×2): 1

## 2023-03-30 MED ORDER — BUPIVACAINE-EPINEPHRINE (PF) 0.25% -1:200000 IJ SOLN
INTRAMUSCULAR | Status: AC
  Filled 2023-03-30: qty 30

## 2023-03-30 MED ORDER — AMITRIPTYLINE HCL 50 MG PO TABS
100.0000 mg | ORAL_TABLET | Freq: Every day | ORAL | Status: DC
  Administered 2023-03-30: 100 mg via ORAL
  Filled 2023-03-30 (×2): qty 2

## 2023-03-30 MED ORDER — ONDANSETRON HCL 4 MG PO TABS: 4.0000 mg | ORAL_TABLET | Freq: Three times a day (TID) | ORAL | 0 refills | Status: DC | PRN

## 2023-03-30 MED ORDER — MAGNESIUM CITRATE PO SOLN: 1.0000 | Freq: Once | ORAL | Status: DC | PRN

## 2023-03-30 MED ORDER — QUETIAPINE FUMARATE ER 300 MG PO TB24
300.0000 mg | ORAL_TABLET | Freq: Every day | ORAL | Status: DC
  Administered 2023-03-30: 300 mg via ORAL
  Filled 2023-03-30 (×2): qty 1

## 2023-03-30 MED ORDER — LABETALOL HCL 5 MG/ML IV SOLN
INTRAVENOUS | Status: AC
  Filled 2023-03-30: qty 4

## 2023-03-30 MED ORDER — ACETAMINOPHEN 10 MG/ML IV SOLN
INTRAVENOUS | Status: AC
  Filled 2023-03-30: qty 100

## 2023-03-30 MED ORDER — MYCOPHENOLATE MOFETIL 250 MG PO CAPS
500.0000 mg | ORAL_CAPSULE | Freq: Two times a day (BID) | ORAL | Status: DC
  Administered 2023-03-30 – 2023-03-31 (×2): 500 mg via ORAL
  Filled 2023-03-30 (×3): qty 2

## 2023-03-30 MED ORDER — ACETAMINOPHEN 10 MG/ML IV SOLN
INTRAVENOUS | Status: DC | PRN
Start: 2023-03-30 — End: 2023-03-30
  Administered 2023-03-30: 1000 mg via INTRAVENOUS

## 2023-03-30 MED ORDER — HYDROXYCHLOROQUINE SULFATE 200 MG PO TABS
400.0000 mg | ORAL_TABLET | Freq: Every day | ORAL | Status: DC
  Administered 2023-03-30: 400 mg via ORAL
  Filled 2023-03-30 (×2): qty 2

## 2023-03-30 MED ORDER — PROPOFOL 10 MG/ML IV BOLUS
INTRAVENOUS | Status: AC
  Filled 2023-03-30: qty 20

## 2023-03-30 MED ORDER — BUSPIRONE HCL 15 MG PO TABS
15.0000 mg | ORAL_TABLET | Freq: Every day | ORAL | Status: DC
  Administered 2023-03-31: 15 mg via ORAL
  Filled 2023-03-30: qty 1

## 2023-03-30 MED ORDER — METHOCARBAMOL 500 MG PO TABS
ORAL_TABLET | ORAL | Status: AC
  Administered 2023-03-30: 500 mg via ORAL
  Filled 2023-03-30: qty 1

## 2023-03-30 MED ORDER — DEXAMETHASONE SODIUM PHOSPHATE 10 MG/ML IJ SOLN
INTRAMUSCULAR | Status: DC | PRN
  Administered 2023-03-30: 10 mg via INTRAVENOUS

## 2023-03-30 MED ORDER — SODIUM CHLORIDE 0.9% FLUSH: 3.0000 mL | Freq: Two times a day (BID) | INTRAVENOUS | Status: DC

## 2023-03-30 MED ORDER — DEXAMETHASONE SODIUM PHOSPHATE 10 MG/ML IJ SOLN
INTRAMUSCULAR | Status: AC
  Filled 2023-03-30: qty 1

## 2023-03-30 MED ORDER — PROPOFOL 10 MG/ML IV BOLUS
INTRAVENOUS | Status: DC | PRN
  Administered 2023-03-30: 160 mg via INTRAVENOUS

## 2023-03-30 MED ORDER — PHENOL 1.4 % MT LIQD: 1.0000 | OROMUCOSAL | Status: DC | PRN

## 2023-03-30 MED ORDER — ACETAMINOPHEN 500 MG PO TABS: 1000.0000 mg | ORAL_TABLET | Freq: Once | ORAL | Status: DC | PRN

## 2023-03-30 MED ORDER — SUGAMMADEX SODIUM 200 MG/2ML IV SOLN
INTRAVENOUS | Status: DC | PRN
  Administered 2023-03-30: 260 mg via INTRAVENOUS

## 2023-03-30 MED ORDER — METOPROLOL SUCCINATE ER 25 MG PO TB24
25.0000 mg | ORAL_TABLET | Freq: Every day | ORAL | Status: DC
  Administered 2023-03-31: 25 mg via ORAL
  Filled 2023-03-30: qty 1

## 2023-03-30 MED ORDER — HYDROMORPHONE HCL 1 MG/ML IJ SOLN
INTRAMUSCULAR | Status: DC | PRN
Start: 2023-03-30 — End: 2023-03-30
  Administered 2023-03-30: .5 mg via INTRAVENOUS

## 2023-03-30 MED ORDER — BUPIVACAINE-EPINEPHRINE 0.25% -1:200000 IJ SOLN
INTRAMUSCULAR | Status: DC | PRN
  Administered 2023-03-30: 10 mL

## 2023-03-30 MED ORDER — SODIUM CHLORIDE 0.9% FLUSH: 3.0000 mL | INTRAVENOUS | Status: DC | PRN

## 2023-03-30 MED ORDER — ROCURONIUM BROMIDE 10 MG/ML (PF) SYRINGE
PREFILLED_SYRINGE | INTRAVENOUS | Status: AC
  Filled 2023-03-30: qty 10

## 2023-03-30 MED ORDER — FENTANYL CITRATE (PF) 100 MCG/2ML IJ SOLN
25.0000 ug | INTRAMUSCULAR | Status: DC | PRN
  Administered 2023-03-30: 25 ug via INTRAVENOUS

## 2023-03-30 MED ORDER — ACETAMINOPHEN 160 MG/5ML PO SOLN: 1000.0000 mg | Freq: Once | ORAL | Status: DC | PRN

## 2023-03-30 MED ORDER — BUPROPION HCL ER (XL) 300 MG PO TB24
300.0000 mg | ORAL_TABLET | Freq: Every day | ORAL | Status: DC
  Administered 2023-03-31: 300 mg via ORAL
  Filled 2023-03-30: qty 1

## 2023-03-30 MED ORDER — LABETALOL HCL 5 MG/ML IV SOLN
INTRAVENOUS | Status: DC | PRN
Start: 2023-03-30 — End: 2023-03-30
  Administered 2023-03-30: 10 mg via INTRAVENOUS
  Administered 2023-03-30: 5 mg via INTRAVENOUS

## 2023-03-30 MED ORDER — ACETAMINOPHEN 650 MG RE SUPP: 650.0000 mg | RECTAL | Status: DC | PRN

## 2023-03-30 MED ORDER — FENTANYL CITRATE (PF) 100 MCG/2ML IJ SOLN
INTRAMUSCULAR | Status: AC
  Administered 2023-03-30: 25 ug via INTRAVENOUS
  Filled 2023-03-30: qty 2

## 2023-03-30 MED ORDER — MENTHOL 3 MG MT LOZG: 1.0000 | LOZENGE | OROMUCOSAL | Status: DC | PRN

## 2023-03-30 SURGICAL SUPPLY — 67 items
AGENT HMST KT MTR STRL THRMB (HEMOSTASIS) ×2
ANCH LD SWIFT-LCK ×2 IMPLANT
ANCHOR SWIFT LOCK IMPLANT
BAG COUNTER SPONGE SURGICOUNT (BAG) IMPLANT
BAG SPNG CNTER NS LX DISP (BAG)
CANISTER SUCT 3000ML PPV (MISCELLANEOUS) ×1 IMPLANT
CHARGER BATTERY NEUROSTIM ABT (NEUROSURGERY SUPPLIES) IMPLANT
CLSR STERI-STRIP ANTIMIC 1/2X4 (GAUZE/BANDAGES/DRESSINGS) ×1 IMPLANT
CONTROLLER NEUROSTIM PATIENT (NEUROSURGERY SUPPLIES) IMPLANT
COVER MAYO STAND STRL (DRAPES) ×1 IMPLANT
COVER PROBE W GEL 5X96 (DRAPES) IMPLANT
COVER SURGICAL LIGHT HANDLE (MISCELLANEOUS) ×1 IMPLANT
DRAPE C-ARM 42X72 X-RAY (DRAPES) ×1 IMPLANT
DRAPE SURG 17X23 STRL (DRAPES) ×1 IMPLANT
DRAPE U-SHAPE 47X51 STRL (DRAPES) ×1 IMPLANT
DRSG OPSITE POSTOP 4X6 (GAUZE/BANDAGES/DRESSINGS) ×1 IMPLANT
DURAPREP 26ML APPLICATOR (WOUND CARE) ×1 IMPLANT
ELECT BLADE 4.0 EZ CLEAN MEGAD (MISCELLANEOUS)
ELECT CAUTERY BLADE 6.4 (BLADE) IMPLANT
ELECT PENCIL ROCKER SW 15FT (MISCELLANEOUS) ×1 IMPLANT
ELECT REM PT RETURN 9FT ADLT (ELECTROSURGICAL) ×1
ELECTRODE BLDE 4.0 EZ CLN MEGD (MISCELLANEOUS) IMPLANT
ELECTRODE REM PT RTRN 9FT ADLT (ELECTROSURGICAL) ×1 IMPLANT
GENERATOR NEUROSTIM ETERNA 16 (Generator) ×2 IMPLANT
GLOVE BIO SURGEON STRL SZ7 (GLOVE) ×1 IMPLANT
GLOVE BIOGEL PI IND STRL 7.0 (GLOVE) ×1 IMPLANT
GLOVE BIOGEL PI IND STRL 8.5 (GLOVE) ×1 IMPLANT
GLOVE SS N UNI LF 8.5 STRL (GLOVE) ×1 IMPLANT
GOWN STRL REUS W/ TWL LRG LVL3 (GOWN DISPOSABLE) ×2 IMPLANT
GOWN STRL REUS W/TWL 2XL LVL3 (GOWN DISPOSABLE) ×1 IMPLANT
GOWN STRL REUS W/TWL LRG LVL3 (GOWN DISPOSABLE) ×2
KIT BASIN OR (CUSTOM PROCEDURE TRAY) ×1 IMPLANT
KIT CHARGER NEUROSTIM ABT (NEUROSURGERY SUPPLIES) IMPLANT
KIT TURNOVER KIT B (KITS) ×1 IMPLANT
LEAD OCTRODE GEN 8CH 60CM (Lead) IMPLANT
MAGNET NEUROSTIM ETERNA PAT (NEUROSURGERY SUPPLIES) IMPLANT
MANUAL PATIENT NEUROSTIM DISP (MISCELLANEOUS) IMPLANT
NDL 22X1.5 STRL (OR ONLY) (MISCELLANEOUS) ×1 IMPLANT
NDL SPNL 18GX3.5 QUINCKE PK (NEEDLE) ×1 IMPLANT
NEEDLE 22X1.5 STRL (OR ONLY) (MISCELLANEOUS) ×1 IMPLANT
NEEDLE SPNL 18GX3.5 QUINCKE PK (NEEDLE) ×1 IMPLANT
NS IRRIG 1000ML POUR BTL (IV SOLUTION) ×1 IMPLANT
PACK LAMINECTOMY ORTHO (CUSTOM PROCEDURE TRAY) ×1 IMPLANT
PACK UNIVERSAL I (CUSTOM PROCEDURE TRAY) ×1 IMPLANT
PAD ARMBOARD 7.5X6 YLW CONV (MISCELLANEOUS) ×3 IMPLANT
SPATULA SILICONE BRAIN 10MM (MISCELLANEOUS) IMPLANT
SPONGE SURGIFOAM ABS GEL 100 (HEMOSTASIS) ×1 IMPLANT
SPONGE T-LAP 4X18 ~~LOC~~+RFID (SPONGE) IMPLANT
STAPLER VISISTAT 35W (STAPLE) IMPLANT
STRIP CLOSURE SKIN 1/2X4 (GAUZE/BANDAGES/DRESSINGS) IMPLANT
SURGIFLO W/THROMBIN 8M KIT (HEMOSTASIS) ×1 IMPLANT
SUT BONE WAX W31G (SUTURE) ×1 IMPLANT
SUT ETHIBOND 2 OS 4 DA (SUTURE) ×1 IMPLANT
SUT ETHIBOND 4 0 TF (SUTURE) IMPLANT
SUT MNCRL AB 3-0 PS2 18 (SUTURE) ×2 IMPLANT
SUT STRATAFIX 1PDS 45CM VIOLET (SUTURE) IMPLANT
SUT VIC AB 0 CT1 27 (SUTURE) ×1
SUT VIC AB 0 CT1 27XBRD ANBCTR (SUTURE) IMPLANT
SUT VIC AB 1 CT1 18XCR BRD 8 (SUTURE) ×2 IMPLANT
SUT VIC AB 1 CT1 8-18 (SUTURE) ×4
SUT VIC AB 2-0 CT1 18 (SUTURE) ×1 IMPLANT
SYR BULB IRRIG 60ML STRL (SYRINGE) ×1 IMPLANT
SYR CONTROL 10ML LL (SYRINGE) ×1 IMPLANT
TOWEL GREEN STERILE (TOWEL DISPOSABLE) ×1 IMPLANT
TOWEL GREEN STERILE FF (TOWEL DISPOSABLE) ×1 IMPLANT
WATER STERILE IRR 1000ML POUR (IV SOLUTION) ×1 IMPLANT
YANKAUER SUCT BULB TIP NO VENT (SUCTIONS) ×1 IMPLANT

## 2023-03-30 NOTE — Anesthesia Procedure Notes (Signed)
Procedure Name: Intubation Date/Time: 03/30/2023 3:12 PM  Performed by: Earlene Plater, CRNAPre-anesthesia Checklist: Patient identified, Emergency Drugs available, Suction available, Patient being monitored and Timeout performed Patient Re-evaluated:Patient Re-evaluated prior to induction Oxygen Delivery Method: Circle system utilized Preoxygenation: Pre-oxygenation with 100% oxygen Induction Type: IV induction Ventilation: Mask ventilation without difficulty Laryngoscope Size: Glidescope and 4 Grade View: Grade II Tube type: Oral Tube size: 7.5 mm Number of attempts: 1 Airway Equipment and Method: Stylet, Bite block and Video-laryngoscopy Placement Confirmation: ETT inserted through vocal cords under direct vision, positive ETCO2, CO2 detector and breath sounds checked- equal and bilateral Secured at: 23 (@lips ; Confirmed by Dr, Maple Hudson) cm Tube secured with: Tape Dental Injury: Teeth and Oropharynx as per pre-operative assessment

## 2023-03-30 NOTE — Op Note (Signed)
OPERATIVE REPORT  DATE OF SURGERY: 03/30/2023  PATIENT NAME:  Shannon [name] MRN: [id number] DOB: 06/15/1971  PCP: Eunice Blase, PA-C  PRE-OPERATIVE DIAGNOSIS: Chronic pain syndrome.  Status post successful spinal cord stimulator trial  POST-OPERATIVE DIAGNOSIS: Same  PROCEDURE:   Placement of spinal cord stimulator  SURGEON:  Venita Lick, MD  PHYSICIAN ASSISTANT: Luther Bradley  ANESTHESIA:   General  EBL: 75 ml   Complications: None  Implants: Abbott octrode lead x 2. Young Berry pulse generator  BRIEF HISTORY: Shannon [name] is a 52 y.o. female who has had chronic back buttock and neuropathic leg pain.  She recently had a spinal cord stimulator trial and had an excellent response with improvement of quality of life and less pain.  Subsequently we elected to move forward with permanent implantation.  All appropriate risks, benefits, and alternatives to surgery were discussed and consent was obtained.  PROCEDURE DETAILS: Patient was brought into the operating room and was properly positioned on the operating room table.  After induction with general anesthesia the patient was endotracheally intubated.  A timeout was taken to confirm all important data: including patient, procedure, and the level. Teds, SCD's were applied.   Patient was placed prone on the Wilson frame and the back was prepped and draped in a standard fashion.  Using fluoroscopy I marked out the T10 and T12 pedicles and marked out my incision to span this base.  Preoperatively I already marked out the left sided battery site.  Both the incision sites were infiltrated with quarter percent Marcaine with epinephrine.  The thoracic incision was made and sharp dissection was carried out down to the deep fascia.  The deep fascia was sharply incised and I stripped the paraspinal muscles to expose the inferior third of the spinous process and lamina of T10 and the entire T11 spinous process and lamina and a portion of T12.   Hemostasis is obtained using bipolar cautery and retractors were placed.  I confirmed the T10-11 interspace with fluoroscopy.  I remove the inferior third of the spinous process of T10 and using a 2 mm Kerrison rongeur performed a laminotomy of T10.  I then used the Anegam 4 to dissect through the ligamentum flavum and 81 and 2 mm Kerrison rongeur to remove the ligamentum flavum and a expose the dorsal thecal sac and epidural fat.  At this point I placed the first lead which I was able to advance without resistance along the dorsal surface of the thecal sac.  It came to rest at the midportion of the T8 body.  I confirmed with the representative from the spinal cord stimulator company that this was properly positioned and would cover the same area that was addressed during the trial.  Once this was confirmed a second lead was placed just to the left side of this.  I advanced the slightly further than the first spanning from the superior aspect of the T8 vertebral body down to T9-10 disc space.  This covered an area according to the rep that was satisfactory.  At this point both leads were properly positioned per the rep.  AP and lateral x-rays demonstrate satisfactory position of both leads in the planes.  The Swift lock was then inserted over the lead and locked in place and then sutured directly to the T11 spinous process with a #2 Ethibond suture.  With the lead now secured to the spinous process I then passed it through the T11-12 interspinous process ligament.  This provided secure  fixation of the leads.  At this point I moved forward with the battery.  The second incision was made and I dissected down 2-1/2 cm and created my pocket.  Using the submuscular passer I advanced the leads from the thoracic wound to the battery site.  At this point both wounds were copiously irrigated with normal saline.  I made sure hemostasis using proper electrocautery, Floseal and Bovie.  Once I confirmed satisfactory  hemostasis I then obtained the battery and disconnected the Bovie and bipolar.  The leads were secured to the battery and the battery was placed into the pocket with the excess portion of the lead packed underneath the battery.  The lead was then directly sutured to the deep fascia with two #1 Vicryl sutures.  At this point the representative tested the leads and the unit was working reportedly.  Both wounds were irrigated and the deep fascia was closed with interrupted #1 Vicryl suture.  Vanco powder was placed in the thoracic wound and the wound was closed in a double fashion with a running 0 Vicryl suture followed by interrupted 2-0 Vicryl sutures.  3-0 Monocryl was then used to close the skin edges.  At the battery site after the #1 Vicryl layer I used interrupted 2-0 Vicryl sutures followed by 3-0 Monocryl for the skin.  Steri-Strips and dry dressings were applied and the patient was ultimately extubated transfer the PACU without incident.  The end of the case all needle sponge counts were correct.  There were no adverse intraoperative events.  Venita Lick, MD 03/30/2023 5:25 PM

## 2023-03-30 NOTE — Transfer of Care (Signed)
Immediate Anesthesia Transfer of Care Note  Patient: DEKLYN TRACHTENBERG  Procedure(s) Performed: SPINAL CORD STIMULATOR INSERTION  Patient Location: PACU  Anesthesia Type:General  Level of Consciousness: drowsy  Airway & Oxygen Therapy: Patient Spontanous Breathing and Patient connected to face mask oxygen  Post-op Assessment: Report given to RN and Post -op Vital signs reviewed and stable  Post vital signs: Reviewed and stable  Last Vitals:  Vitals Value Taken Time  BP 125/76 03/30/23 1815  Temp    Pulse 84 03/30/23 1819  Resp 15 03/30/23 1819  SpO2 95 % 03/30/23 1819  Vitals shown include unfiled device data.  Last Pain:  Vitals:   03/30/23 1146  TempSrc:   PainSc: 7       Patients Stated Pain Goal: 2 (03/30/23 1146)  Complications: No notable events documented.

## 2023-03-30 NOTE — Discharge Instructions (Signed)
Spinal Cord Stimulator   This sheet gives you information about how to care for yourself after your procedure. Your health care provider may also give you more specific instructions. If you have problems or questions, contact your health care provider. What can I expect after the procedure? After the procedure, it is common to have: Soreness or pain. Some swelling in the area where the hardware was removed. A small amount of blood or clear fluid coming from your incision. Follow these instructions at home: If you have a cast: Do not stick anything inside the cast to scratch your skin. Doing that increases your risk of infection. Check the skin around the cast every day. Tell your health care provider about any concerns. You may put lotion on dry skin around the edges of the cast. Do not put lotion on the skin underneath the cast. Keep the cast clean and dry. Do not take baths, swim, or use a hot tub until your health care provider approves. Ask your health care provider if you may take showers. You may only be allowed to take sponge baths. Keep the bandage (dressing) dry until your health care provider says it can be removed. Ok to shower in 5 days.    Incision care  Follow instructions from your health care provider about how to take care of your incision. Make sure you: Wash your hands with soap and water before you change your dressing. If soap and water are not available, use hand sanitizer. Change your dressing as told by your health care provider. Leave stitches (sutures), skin glue, or adhesive strips in place. These skin closures may need to stay in place for 2 weeks or longer. If adhesive strip edges start to loosen and curl up, you may trim the loose edges. Do not remove adhesive strips completely unless your health care provider tells you to do that. Check your incision area every day for signs of infection. Check for: Redness. More swelling or pain. More fluid or  blood. Warmth. Pus or a bad smell. Managing pain, stiffness, and swelling  If directed, put ice on the affected area: Put ice in a plastic bag. Place a towel between your skin and the bag. Leave the ice on for 20 minutes, 2-3 times a day. Move your fingers or toes often to avoid stiffness and to lessen swelling.  Driving Do not drive or use heavy machinery while taking prescription pain medicine. Do not drive for 24 hours if you were given a medicine to help you relax (sedative) during your procedure. Ask your health care provider when it is safe to drive if you have a cast, splint, or boot on the affected limb. Activity Ask your health care provider what activities are safe for you during recovery, and ask what activities you need to avoid. Do not use the injured limb to support your body weight until your health care provider says that you can. Do not play contact sports until your health care provider approves. Do exercises as told by your health care provider. Avoid sitting for a long time without moving. Get up and move around at least every few hours. This will help prevent blood clots. General instructions Do not put pressure on any part of the cast or splint until it is fully hardened. This may take several hours. If you are taking prescription pain medicine, take actions to prevent or treat constipation. Your health care provider may recommend that you: Drink enough fluid to keep your urine  pale yellow. Eat foods that are high in fiber, such as fresh fruits and vegetables, whole grains, and beans. Limit foods that are high in fat and processed sugars, such as fried or sweet foods. Take an over-the-counter or prescription medicine for constipation. Do not use any products that contain nicotine or tobacco, such as cigarettes and e-cigarettes. These can delay bone healing after surgery. If you need help quitting, ask your health care provider. Take over-the-counter and prescription  medicines only as told by your health care provider. Keep all follow-up visits as told by your health care provider. This is important. Contact a health care provider if: You have lasting pain. You have redness around your incision. You have more swelling or pain around your incision. You have more fluid or blood coming from your incision. Your incision feels warm to the touch. You have pus or a bad smell coming from your incision. You are unable to do exercises or physical activity as told by your health care provider. Get help right away if: You have difficulty breathing. You have chest pain. You have severe pain. You have a fever or chills. You have numbness for more than 24 hours in the area where the hardware was removed. Summary After the procedure, it is common to have some pain and swelling in the area where the hardware was removed. Follow instructions from your health care provider about how to take care of your incision. Return to your normal activities as told by your health care provider. Ask your health care provider what activities are safe for you. This information is not intended to replace advice given to you by your health care provider. Make sure you discuss any questions you have with your health care provider.

## 2023-03-30 NOTE — Brief Op Note (Signed)
03/30/2023  5:33 PM  PATIENT:  Shannon [name]  52 y.o. female  PRE-OPERATIVE DIAGNOSIS:  Failed back syndrome, status post successful Spinal cord stimulator trail  POST-OPERATIVE DIAGNOSIS:  Failed back syndrome, status post successful Spinal cord stimulator trail  PROCEDURE:  Procedure(s) with comments: SPINAL CORD STIMULATOR INSERTION (N/A) - 3 C-Bed  SURGEON:  Surgeons and Role:    Venita Lick, MD - Primary  PHYSICIAN ASSISTANT:   ASSISTANTS: Luther Bradley   ANESTHESIA:   general  EBL:  75 mL   BLOOD ADMINISTERED:none  DRAINS: none   LOCAL MEDICATIONS USED:  MARCAINE     SPECIMEN:  No Specimen  DISPOSITION OF SPECIMEN:  N/A  COUNTS:  YES  TOURNIQUET:  * No tourniquets in log *  DICTATION: .Dragon Dictation  PLAN OF CARE: Admit for overnight observation  PATIENT DISPOSITION:  PACU - hemodynamically stable.

## 2023-03-30 NOTE — H&P (Signed)
History: Shannon [name] is a very pleasant 52 year old woman whose had chronic debilitating back pain for several years. She recently had a spinal cord stimulator trial and noted greater than 50% improvement in her pain and overall quality of life. Given the success of the temporary trial spinal cord stimulator she has expressed a desire to move forward with permanent implantation.  Past Medical History:  Diagnosis Date   Anemia    Anxiety    Depression    Dyspnea    Sometimes at rest   G6PD deficiency    GERD (gastroesophageal reflux disease)    Hypertension    ILD (interstitial lung disease) (HCC)    Lupus (HCC)    Pneumonia    Rheumatoid arthritis (HCC)    Sleep apnea     Allergies  Allergen Reactions   Asa [Aspirin] Hives and Shortness Of Breath    Chest tightness    Penicillins Hives    Has patient had a PCN reaction causing immediate rash, facial/tongue/throat swelling, SOB or lightheadedness with hypotension: Unknown Has patient had a PCN reaction causing severe rash involving mucus membranes or skin necrosis: Unknown Has patient had a PCN reaction that required hospitalization: Unknown Has patient had a PCN reaction occurring within the last 10 years: No If all of the above answers are "NO", then may proceed with Cephalosporin use.  Tolerated Cephalosporin Date: 05/01/20.     Sulfa Antibiotics     G6PD deficiency     No current facility-administered medications on file prior to encounter.   Current Outpatient Medications on File Prior to Encounter  Medication Sig Dispense Refill   albuterol (VENTOLIN HFA) 108 (90 Base) MCG/ACT inhaler Inhale 2 puffs into the lungs every 6 (six) hours as needed for wheezing or shortness of breath. 1 each 5   amitriptyline (ELAVIL) 100 MG tablet Take 100 mg by mouth at bedtime.     buPROPion (WELLBUTRIN XL) 300 MG 24 hr tablet Take 300 mg by mouth daily.     busPIRone (BUSPAR) 15 MG tablet Take 15 mg by mouth daily.     cetirizine  (ZYRTEC) 10 MG tablet TAKE 1 TABLET BY MOUTH AT BEDTIME 30 tablet 0   colchicine 0.6 MG tablet Take 0.6 mg by mouth daily as needed (gout).     hydroxychloroquine (PLAQUENIL) 200 MG tablet Take 400 mg by mouth at bedtime.     ibuprofen (ADVIL) 200 MG tablet Take 600 mg by mouth every 6 (six) hours as needed for moderate pain.     lisinopril (ZESTRIL) 10 MG tablet Take 10 mg by mouth daily.     Magnesium 250 MG TABS Take 250 mg by mouth at bedtime.     metoprolol succinate (TOPROL-XL) 25 MG 24 hr tablet Take 25 mg by mouth daily.     montelukast (SINGULAIR) 10 MG tablet TAKE 1 TABLET BY MOUTH AT BEDTIME 30 tablet 11   mycophenolate (CELLCEPT) 250 MG capsule Take 500 mg by mouth 2 (two) times daily.     MYRBETRIQ 25 MG TB24 tablet Take 25 mg by mouth daily.     ondansetron (ZOFRAN-ODT) 8 MG disintegrating tablet Take 1 tablet (8 mg total) by mouth every 8 (eight) hours as needed for nausea or vomiting. 10 tablet 0   Oxycodone HCl 10 MG TABS Take 10 mg by mouth 4 (four) times daily.     QUEtiapine (SEROQUEL XR) 300 MG 24 hr tablet Take 300 mg by mouth at bedtime.  sertraline (ZOLOFT) 100 MG tablet Take 200 mg by mouth at bedtime.  0   Budeson-Glycopyrrol-Formoterol (BREZTRI AEROSPHERE) 160-9-4.8 MCG/ACT AERO Inhale 2 puffs into the lungs in the morning and at bedtime. (Patient not taking: Reported on 03/18/2023) 10.7 g 0   fluticasone (FLONASE) 50 MCG/ACT nasal spray Place 2 sprays into both nostrils daily as needed for allergies. (Patient not taking: Reported on 03/19/2023) 11 mL 1   mycophenolate (CELLCEPT) 500 MG tablet Take 2 tablets (1,000 mg total) by mouth 2 (two) times daily. (Patient not taking: Reported on 03/19/2023)      Physical Exam: Clinical exam: Aften is a pleasant individual, who appears younger than their stated age.  She is alert and orientated 3.  No shortness of breath, chest pain.  Abdomen is soft and non-tender, negative loss of bowel and bladder control, no rebound  tenderness.  Negative: skin lesions abrasions contusions  Peripheral pulses: 2+ peripheral pulses bilaterally. LE compartments are: Soft and nontender.  Gait pattern: Normal  Assistive devices: None  Neuro: No focal neurological deficits on motor or sensory testing in the lower extremity. 1+ deep tendon reflexes. Negative nerve root tension signs. No clonus. Negative Babinski test.  Musculoskeletal: Significant back pain with light palpation in the midline radiating into the paraspinal region. No SI joint pain.  Imaging: X-rays of the lumbar spine demonstrate mild degenerative disc space changes at L5-S1. No scoliosis or spondylolisthesis.  Lumbar MRI: completed on 11/09/2022. Mild neuroforaminal stenosis L2-L5. Small central disc protrusion at L5-S1 which contacts but does not compress the traversing S1 nerve root.  Thoracic MRI: completed on 01/09/2023. Unremarkable thoracic spine. No significant stenosis. No cord signal changes. No contraindications for implantation of spinal cord stimulator device.  A/P: Shannon [name] is a very pleasant 52 year old woman who has had chronic debilitating back pain. She recently had a successful spinal cord stimulator trial with improvement in quality of life and overall pain. Patient notes greater than 50% improvement during the spinal cord stimulator trial. As a result of the successful trial she presents now for permanent implantation.  I have gone over the surgical procedure in great detail with with her and I included the risks and benefits of surgery. She is expressed understanding and willingness to move forward with surgery. Risks and benefits of surgery were discussed with the patient. These include: Infection, bleeding, death, stroke, paralysis, ongoing or worse pain, need for additional surgery, leak of spinal fluid, Failure of the battery requiring reoperation. Inability to place the paddle requiring the surgery to be aborted. Migration of the lead,  failure to obtain results similar to the trial.

## 2023-03-31 DIAGNOSIS — M961 Postlaminectomy syndrome, not elsewhere classified: Secondary | ICD-10-CM | POA: Diagnosis not present

## 2023-03-31 MED FILL — Thrombin For Soln 20000 Unit: CUTANEOUS | Qty: 1 | Status: AC

## 2023-03-31 NOTE — Progress Notes (Signed)
Patient alert and oriented, voiding adequately, skin clean, dry and intact without evidence of skin break down, or symptoms of complications - no redness or edema noted, only slight tenderness at site.  Patient states pain is manageable at time of discharge. Patient has an appointment with MD in 2 weeks 

## 2023-03-31 NOTE — Discharge Summary (Signed)
Patient ID: Shannon [name] MRN: [id number] DOB/AGE: [birth year] 50 y.o.  Admit date: 03/30/2023 Discharge date: 03/31/2023  Admission Diagnoses:  Principal Problem:   Chronic pain   Discharge Diagnoses:  Principal Problem:   Chronic pain  status post Procedure(s): SPINAL CORD STIMULATOR INSERTION  Past Medical History:  Diagnosis Date   Anemia    Anxiety    Depression    Dyspnea    Sometimes at rest   G6PD deficiency    GERD (gastroesophageal reflux disease)    Hypertension    ILD (interstitial lung disease) (HCC)    Lupus    Pneumonia    Rheumatoid arthritis (HCC)    Sleep apnea     Surgeries: Procedure(s): SPINAL CORD STIMULATOR INSERTION on 03/30/2023   Consultants:   Discharged Condition: Improved  Hospital Course: Shannon [name] is an 52 y.o. female who was admitted 03/30/2023 for operative treatment of Chronic pain. Patient failed conservative treatments (please see the history and physical for the specifics) and had severe unremitting pain that affects sleep, daily activities and work/hobbies. After pre-op clearance, the patient was taken to the operating room on 03/30/2023 and underwent  Procedure(s): SPINAL CORD STIMULATOR INSERTION.    Patient was given perioperative antibiotics:  Anti-infectives (From admission, onward)    Start     Dose/Rate Route Frequency Ordered Stop   03/30/23 2300  ceFAZolin (ANCEF) IVPB 1 g/50 mL premix        1 g 100 mL/hr over 30 Minutes Intravenous Every 8 hours 03/30/23 1935 03/31/23 0604   03/30/23 2200  hydroxychloroquine (PLAQUENIL) tablet 400 mg        400 mg Oral Daily at bedtime 03/30/23 1935     03/30/23 1700  vancomycin (VANCOCIN) powder  Status:  Discontinued          As needed 03/30/23 1701 03/30/23 1806   03/30/23 1200  ceFAZolin (ANCEF) IVPB 3g/100 mL premix        3 g 200 mL/hr over 30 Minutes Intravenous 30 min pre-op 03/30/23 1132 03/30/23 1552        Patient was given sequential compression devices and  early ambulation to prevent DVT.   Patient benefited maximally from hospital stay and there were no complications. At the time of discharge, the patient was urinating/moving their bowels without difficulty, tolerating a regular diet, pain is controlled with oral pain medications and they have been cleared by PT/OT.   Recent vital signs: Patient Vitals for the past 24 hrs:  BP Temp Temp src Pulse Resp SpO2 Height Weight  03/31/23 0501 (!) 143/87 97.7 F (36.5 C) Oral 91 20 97 % -- --  03/30/23 2355 (!) 152/99 97.8 F (36.6 C) Oral 91 20 95 % -- --  03/30/23 2014 115/80 (!) 97.5 F (36.4 C) Oral 83 18 97 % -- --  03/30/23 1930 139/87 -- -- -- -- -- -- --  03/30/23 1905 -- -- -- 85 (!) 21 96 % -- --  03/30/23 1900 128/80 -- -- 82 (!) 9 95 % -- --  03/30/23 1845 (!) 131/91 -- -- 80 (!) 8 93 % -- --  03/30/23 1815 125/76 98.3 F (36.8 C) -- 83 17 98 % -- --  03/30/23 1114 (!) 147/91 98.5 F (36.9 C) Oral 97 18 96 % 5\' 11"  (1.803 m) 127 kg     Recent laboratory studies: No results for input(s): "WBC", "HGB", "HCT", "PLT", "NA", "K", "CL", "CO2", "BUN", "CREATININE", "GLUCOSE", "INR", "CALCIUM" in the last 72  hours.  Invalid input(s): "PT", "2"   Discharge Medications:   Allergies as of 03/31/2023       Reactions   Asa [aspirin] Hives, Shortness Of Breath   Chest tightness    Penicillins Hives   Has patient had a PCN reaction causing immediate rash, facial/tongue/throat swelling, SOB or lightheadedness with hypotension: Unknown Has patient had a PCN reaction causing severe rash involving mucus membranes or skin necrosis: Unknown Has patient had a PCN reaction that required hospitalization: Unknown Has patient had a PCN reaction occurring within the last 10 years: No If all of the above answers are "NO", then may proceed with Cephalosporin use. Tolerated Cephalosporin Date: 05/01/20.     Sulfa Antibiotics    G6PD deficiency         Medication List     STOP taking these  medications    cetirizine 10 MG tablet Commonly known as: ZYRTEC   colchicine 0.6 MG tablet   fluticasone 50 MCG/ACT nasal spray Commonly known as: FLONASE   ibuprofen 200 MG tablet Commonly known as: ADVIL   Magnesium 250 MG Tabs   ondansetron 8 MG disintegrating tablet Commonly known as: ZOFRAN-ODT   Oxycodone HCl 10 MG Tabs       TAKE these medications    albuterol 108 (90 Base) MCG/ACT inhaler Commonly known as: VENTOLIN HFA Inhale 2 puffs into the lungs every 6 (six) hours as needed for wheezing or shortness of breath.   amitriptyline 100 MG tablet Commonly known as: ELAVIL Take 100 mg by mouth at bedtime.   Breztri Aerosphere 160-9-4.8 MCG/ACT Aero Generic drug: Budeson-Glycopyrrol-Formoterol Inhale 2 puffs into the lungs in the morning and at bedtime. What changed: Another medication with the same name was removed. Continue taking this medication, and follow the directions you see here.   buPROPion 300 MG 24 hr tablet Commonly known as: WELLBUTRIN XL Take 300 mg by mouth daily.   busPIRone 15 MG tablet Commonly known as: BUSPAR Take 15 mg by mouth daily.   hydroxychloroquine 200 MG tablet Commonly known as: PLAQUENIL Take 400 mg by mouth at bedtime.   lisinopril 10 MG tablet Commonly known as: ZESTRIL Take 10 mg by mouth daily.   methocarbamol 500 MG tablet Commonly known as: ROBAXIN Take 1 tablet (500 mg total) by mouth every 8 (eight) hours as needed for up to 5 days for muscle spasms.   metoprolol succinate 25 MG 24 hr tablet Commonly known as: TOPROL-XL Take 25 mg by mouth daily.   montelukast 10 MG tablet Commonly known as: SINGULAIR TAKE 1 TABLET BY MOUTH AT BEDTIME   mycophenolate 250 MG capsule Commonly known as: CELLCEPT Take 500 mg by mouth 2 (two) times daily. What changed: Another medication with the same name was removed. Continue taking this medication, and follow the directions you see here.   Myrbetriq 25 MG Tb24  tablet Generic drug: mirabegron ER Take 25 mg by mouth daily.   ondansetron 4 MG tablet Commonly known as: Zofran Take 1 tablet (4 mg total) by mouth every 8 (eight) hours as needed for nausea or vomiting.   oxyCODONE-acetaminophen 10-325 MG tablet Commonly known as: Percocet Take 1 tablet by mouth every 6 (six) hours as needed for up to 5 days for pain.   QUEtiapine 300 MG 24 hr tablet Commonly known as: SEROQUEL XR Take 300 mg by mouth at bedtime.   sertraline 100 MG tablet Commonly known as: ZOLOFT Take 200 mg by mouth at bedtime.  Diagnostic Studies: DG THORACOLUMBAR SPINE  Result Date: 03/30/2023 CLINICAL DATA:  Elective surgery.  Spinal cord stimulator insertion. EXAM: THORACOLUMBAR SPINE 1V COMPARISON:  None Available. FINDINGS: Three fluoroscopic spot views of the thoracolumbar spine obtained in the operating room. Spinal stimulator in place. Fluoroscopy time 1 minutes 24 seconds. Dose 69.18 mGy. IMPRESSION: Intraoperative fluoroscopy during spinal stimulator insertion. Electronically Signed   By: Narda Rutherford M.D.   On: 03/30/2023 20:11   DG C-Arm 1-60 Min-No Report  Result Date: 03/30/2023 Fluoroscopy was utilized by the requesting physician.  No radiographic interpretation.   DG C-Arm 1-60 Min-No Report  Result Date: 03/30/2023 Fluoroscopy was utilized by the requesting physician.  No radiographic interpretation.    Discharge Instructions     Incentive spirometry RT   Complete by: As directed         Follow-up Information     Venita Lick, MD. Schedule an appointment as soon as possible for a visit in 2 week(s).   Specialty: Orthopedic Surgery Why: If symptoms worsen, For suture removal, For wound re-check Contact information: [address] [place] [address] [phone]                 Discharge Plan:  discharge to home  Disposition: Shannon [name] is a very pleasant 52 year old woman who underwent placement of a  spinal cord stimulator for chronic pain.  Dressings are clean dry and intact, she is ambulating, and neurologically intact.  She has voided spontaneously and is tolerating a regular diet.  At this point she will work with physical therapy/Occupational Therapy.  Provided there is no significant issues she should be able to be discharged to home this afternoon.  Appropriate instructions and medications have been provided.  She will follow-up with me in 2 weeks.    Signed: Alvy Beal for Dr. Venita Lick [facility] [phone] 03/31/2023, 7:31 AM

## 2023-03-31 NOTE — Evaluation (Signed)
Occupational Therapy Evaluation Patient Details Name: Shannon [name] MRN: [id number] DOB: 11-25-1970 Today's Date: 03/31/2023   History of Present Illness Shannon [name] is an 51 y.o. female who presented for placement of spinal cord stimulator for chronic pain 10/5. PMHx: anemia, anxiety, depression, dyspnea, GERD, HTN, ILD, PNA, RA, sleep apnea   Clinical Impression   Shannon [name] was evaluated s/p the above admission list. She is mod I and lives with family at baseline. Upon evaluation the pt was limited by back pain, precautions, poor activity tolerance, mild dizziness and generalized weakness. Overall she needed up to min A for bed mobility and superivsion A for tranfers and functional mobility with RW. Due to the deficits listed below the pt also needs up to min A for LB ADLs and set up A for UB ADLs. Pt will benefit from continued acute OT services and home with 24/7 support of family.        If plan is discharge home, recommend the following: A little help with walking and/or transfers;A little help with bathing/dressing/bathroom;Assistance with cooking/housework;Assist for transportation;Help with stairs or ramp for entrance    Functional Status Assessment  Patient has had a recent decline in their functional status and demonstrates the ability to make significant improvements in function in a reasonable and predictable amount of time.  Equipment Recommendations  None recommended by OT       Precautions / Restrictions Precautions Precautions: Fall;Back Precaution Booklet Issued: Yes (comment) Restrictions Weight Bearing Restrictions: No      Mobility Bed Mobility Overal bed mobility: Needs Assistance Bed Mobility: Rolling, Sidelying to Sit, Sit to Sidelying Rolling: Supervision Sidelying to sit: Min assist     Sit to sidelying: Min assist General bed mobility comments: difficulty with log roll    Transfers Overall transfer level: Needs assistance Equipment used: Rolling  walker (2 wheels) Transfers: Sit to/from Stand Sit to Stand: Supervision                  Balance Overall balance assessment: Needs assistance Sitting-balance support: Feet supported Sitting balance-Leahy Scale: Fair     Standing balance support: No upper extremity supported, During functional activity Standing balance-Leahy Scale: Fair Standing balance comment: statically at the sink                           ADL either performed or assessed with clinical judgement   ADL Overall ADL's : Needs assistance/impaired Eating/Feeding: Independent   Grooming: Supervision/safety;Standing   Upper Body Bathing: Set up;Sitting   Lower Body Bathing: Minimal assistance;Sit to/from stand   Upper Body Dressing : Set up;Sitting   Lower Body Dressing: Minimal assistance;Sit to/from stand   Toilet Transfer: Supervision/safety;Ambulation;Rolling walker (2 wheels);Regular Toilet;Grab bars   Toileting- Clothing Manipulation and Hygiene: Supervision/safety;Sitting/lateral lean       Functional mobility during ADLs: Supervision/safety;Rolling walker (2 wheels) General ADL Comments: no overt LOB; cues needed for compensatory tchniques. limited by activity tolerance     Vision Baseline Vision/History: 0 No visual deficits Vision Assessment?: No apparent visual deficits     Perception Perception: Within Functional Limits       Praxis Praxis: WFL       Pertinent Vitals/Pain Pain Assessment Pain Assessment: Faces Faces Pain Scale: Hurts even more Pain Location: back Pain Descriptors / Indicators: Discomfort Pain Intervention(s): Monitored during session, Limited activity within patient's tolerance     Extremity/Trunk Assessment Upper Extremity Assessment Upper Extremity Assessment: Overall WFL for tasks assessed  Lower Extremity Assessment Lower Extremity Assessment: Defer to PT evaluation   Cervical / Trunk Assessment Cervical / Trunk Assessment: Back  Surgery   Communication Communication Communication: No apparent difficulties   Cognition Arousal: Alert Behavior During Therapy: WFL for tasks assessed/performed Overall Cognitive Status: No family/caregiver present to determine baseline cognitive functioning         General Comments: Overall WFL for basic orientation, needs cues to maintain back precautions. Limited insight and problem solving noted     General Comments  VSS on RA     Home Living Family/patient expects to be discharged to:: Private residence Living Arrangements: Spouse/significant other;Children Available Help at Discharge: Family;Available 24 hours/day Type of Home: House Home Access: Stairs to enter [employer] of Steps: 2 Entrance Stairs-Rails: None Home Layout: Two level Alternate Level Stairs-Number of Steps: flight Alternate Level Stairs-Rails: Right Bathroom Shower/Tub: Arts development officer Toilet: Handicapped height     Home Equipment: Agricultural consultant (2 wheels);Rollator (4 wheels);Cane - single point;Electric scooter (adjustable bed)   Additional Comments: dtr in high school, son works      Prior Functioning/Environment Prior Level of Function : Independent/Modified Independent;Driving             Mobility Comments: intermittent use of AD due to pain, denies fall ADLs Comments: mod I, drives        OT Problem List: Decreased activity tolerance;Impaired balance (sitting and/or standing);Decreased safety awareness;Decreased knowledge of use of DME or AE;Decreased knowledge of precautions;Pain      OT Treatment/Interventions: Self-care/ADL training;Therapeutic exercise;DME and/or AE instruction;Therapeutic activities;Patient/family education;Balance training    OT Goals(Current goals can be found in the care plan section) Acute Rehab OT Goals Patient Stated Goal: home OT Goal Formulation: With patient Time For Goal Achievement: 04/14/23 Potential to Achieve Goals:  Good ADL Goals Additional ADL Goal #1: pt will indep maintain back precautions during functional tasks to demonstrate imrpoved safety at d/c  OT Frequency: Min 1X/week       AM-PAC OT "6 Clicks" Daily Activity     Outcome Measure Help from another person eating meals?: None Help from another person taking care of personal grooming?: A Little Help from another person toileting, which includes using toliet, bedpan, or urinal?: A Little Help from another person bathing (including washing, rinsing, drying)?: A Little Help from another person to put on and taking off regular upper body clothing?: A Little Help from another person to put on and taking off regular lower body clothing?: A Little 6 Click Score: 19   End of Session Equipment Utilized During Treatment: Rolling walker (2 wheels) Nurse Communication: Mobility status  Activity Tolerance: Patient tolerated treatment well Patient left: in bed  OT Visit Diagnosis: Unsteadiness on feet (R26.81);Other abnormalities of gait and mobility (R26.89);Muscle weakness (generalized) (M62.81);Pain                Time: 1610-9604 OT Time Calculation (min): 24 min Charges:  OT General Charges $OT Visit: 1 Visit  Derenda Mis, OTR/L Acute Rehabilitation Services Office [phone] Secure Chat Communication Preferred   Donia Pounds 03/31/2023, 10:27 AM

## 2023-03-31 NOTE — Anesthesia Postprocedure Evaluation (Signed)
Anesthesia Post Note  Patient: SHANNYN JANKOWIAK  Procedure(s) Performed: SPINAL CORD STIMULATOR INSERTION     Patient location during evaluation: PACU Anesthesia Type: General Level of consciousness: awake and alert Pain management: pain level controlled Vital Signs Assessment: post-procedure vital signs reviewed and stable Respiratory status: spontaneous breathing, nonlabored ventilation, respiratory function stable and patient connected to nasal cannula oxygen Cardiovascular status: blood pressure returned to baseline and stable Postop Assessment: no apparent nausea or vomiting Anesthetic complications: no   No notable events documented.  Last Vitals:  Vitals:   03/30/23 2355 03/31/23 0501  BP: (!) 152/99 (!) 143/87  Pulse: 91 91  Resp: 20 20  Temp: 36.6 C 36.5 C  SpO2: 95% 97%    Last Pain:  Vitals:   03/31/23 0612  TempSrc:   PainSc: Asleep                 Beryle Lathe

## 2023-03-31 NOTE — Evaluation (Signed)
Physical Therapy Evaluation  Patient Details Name: Shannon [name] MRN: [id number] DOB: 11-26-1970 Today's Date: 03/31/2023  History of Present Illness  Pt is a 52 y.o. female who presented for placement of spinal cord stimulator for chronic pain on 03/28/23. PMH significant for anxiety, depression, dyspnea,  HTN, ILD, PNA, RA.   Clinical Impression  Pt admitted with above diagnosis. At the time of PT eval, pt was able to demonstrate transfers and ambulation with gross CGA and RW for support. Pt was educated on precautions, positioning recommendations, appropriate activity progression, and car transfer. Pt currently with functional limitations due to the deficits listed below (see PT Problem List). Pt will benefit from skilled PT to increase their independence and safety with mobility to allow discharge to the venue listed below.          If plan is discharge home, recommend the following: A little help with walking and/or transfers;A little help with bathing/dressing/bathroom;Assistance with cooking/housework;Assist for transportation;Help with stairs or ramp for entrance   Can travel by private vehicle        Equipment Recommendations None recommended by PT  Recommendations for Other Services       Functional Status Assessment Patient has had a recent decline in their functional status and demonstrates the ability to make significant improvements in function in a reasonable and predictable amount of time.     Precautions / Restrictions Precautions Precautions: Fall;Back Precaution Booklet Issued: Yes (comment) Precaution Comments: Reviewed handout and pt was cued for precautions during functional mobility. Required Braces or Orthoses:  (No brace needed order) Restrictions Weight Bearing Restrictions: No      Mobility  Bed Mobility Overal bed mobility: Needs Assistance Bed Mobility: Rolling, Sidelying to Sit, Sit to Sidelying Rolling: Supervision Sidelying to sit: Contact  guard assist     Sit to sidelying: Contact guard assist General bed mobility comments: Poor log roll technique overall with heavy use of rails to achieve EOB. Multimodal cues to facilitate log roll back into bed at end of session.    Transfers Overall transfer level: Needs assistance Equipment used: Rolling walker (2 wheels) Transfers: Sit to/from Stand Sit to Stand: Supervision           General transfer comment: VC's for hand placement on seated surface for safety.    Ambulation/Gait Ambulation/Gait assistance: Contact guard assist Gait Distance (Feet): 200 Feet Assistive device: Rolling walker (2 wheels) Gait Pattern/deviations: Step-through pattern, Decreased stride length, Trunk flexed Gait velocity: Decreased Gait velocity interpretation: <1.31 ft/sec, indicative of household ambulator   General Gait Details: VC's for improved posture, closer walker proximity and forward gaze. No assist required but close guard provided throughout session.  Stairs Stairs: Yes Stairs assistance: Contact guard assist Stair Management: One rail Right, Step to pattern, Forwards, Sideways Number of Stairs: 5 General stair comments: VC's for sequencing and general safety. Pt with difficulty advancing forwards however was able to negotiate better with 2 hands on the R railing sideways.  Wheelchair Mobility     Tilt Bed    Modified Rankin (Stroke Patients Only)       Balance Overall balance assessment: Needs assistance Sitting-balance support: Feet supported Sitting balance-Leahy Scale: Fair     Standing balance support: No upper extremity supported, During functional activity Standing balance-Leahy Scale: Fair Standing balance comment: statically at the sink                             Pertinent  Vitals/Pain Pain Assessment Pain Assessment: Faces Faces Pain Scale: Hurts whole lot Pain Location: back Pain Descriptors / Indicators: Discomfort Pain  Intervention(s): Limited activity within patient's tolerance, Monitored during session, Repositioned    Home Living Family/patient expects to be discharged to:: Private residence Living Arrangements: Spouse/significant other;Children Available Help at Discharge: Family;Available 24 hours/day Type of Home: House Home Access: Stairs to enter Entrance Stairs-Rails: None Entrance Stairs-Number of Steps: 2 Alternate Level Stairs-Number of Steps: flight Home Layout: Two level Home Equipment: Agricultural consultant (2 wheels);Rollator (4 wheels);Cane - single point;Electric scooter (adjustable bed) Additional Comments: dtr in high school, son works    Prior Function Prior Level of Function : Independent/Modified Independent;Driving             Mobility Comments: intermittent use of AD due to pain, denies fall ADLs Comments: mod I, drives     Extremity/Trunk Assessment   Upper Extremity Assessment Upper Extremity Assessment: Defer to OT evaluation    Lower Extremity Assessment Lower Extremity Assessment: Generalized weakness (Mild; limited by pain)    Cervical / Trunk Assessment Cervical / Trunk Assessment: Back Surgery  Communication   Communication Communication: No apparent difficulties  Cognition Arousal: Alert Behavior During Therapy: WFL for tasks assessed/performed Overall Cognitive Status: No family/caregiver present to determine baseline cognitive functioning                                          General Comments General comments (skin integrity, edema, etc.): VSS on RA    Exercises     Assessment/Plan    PT Assessment Patient needs continued PT services  PT Problem List Decreased strength;Decreased activity tolerance;Decreased balance;Decreased mobility;Decreased safety awareness;Decreased knowledge of use of DME;Decreased knowledge of precautions;Pain       PT Treatment Interventions DME instruction;Gait training;Stair training;Functional  mobility training;Therapeutic activities;Therapeutic exercise;Patient/family education;Balance training    PT Goals (Current goals can be found in the Care Plan section)  Acute Rehab PT Goals Patient Stated Goal: Decrease pain PT Goal Formulation: With patient Time For Goal Achievement: 04/07/23 Potential to Achieve Goals: Good    Frequency Min 5X/week     Co-evaluation               AM-PAC PT "6 Clicks" Mobility  Outcome Measure Help needed turning from your back to your side while in a flat bed without using bedrails?: A Little Help needed moving from lying on your back to sitting on the side of a flat bed without using bedrails?: A Little Help needed moving to and from a bed to a chair (including a wheelchair)?: A Little Help needed standing up from a chair using your arms (e.g., wheelchair or bedside chair)?: A Little Help needed to walk in hospital room?: A Little Help needed climbing 3-5 steps with a railing? : A Little 6 Click Score: 18    End of Session Equipment Utilized During Treatment: Gait belt Activity Tolerance: Patient limited by pain Patient left: in bed Nurse Communication: Mobility status PT Visit Diagnosis: Unsteadiness on feet (R26.81);Pain Pain - part of body:  (back)    Time: 4098-1191 PT Time Calculation (min) (ACUTE ONLY): 22 min   Charges:   PT Evaluation $PT Eval Low Complexity: 1 Low   PT General Charges $$ ACUTE PT VISIT: 1 Visit         Conni Slipper, PT, DPT Acute Rehabilitation Services Secure Chat Preferred Office:  [phone]   Marylynn Pearson 03/31/2023, 10:53 AM

## 2023-04-05 ENCOUNTER — Encounter (HOSPITAL_COMMUNITY): Payer: Self-pay | Admitting: Orthopedic Surgery

## 2023-04-05 DIAGNOSIS — M47816 Spondylosis without myelopathy or radiculopathy, lumbar region: Secondary | ICD-10-CM | POA: Diagnosis not present

## 2023-04-07 ENCOUNTER — Other Ambulatory Visit: Payer: Self-pay | Admitting: Nurse Practitioner

## 2023-04-13 DIAGNOSIS — F332 Major depressive disorder, recurrent severe without psychotic features: Secondary | ICD-10-CM | POA: Diagnosis not present

## 2023-04-13 DIAGNOSIS — F411 Generalized anxiety disorder: Secondary | ICD-10-CM | POA: Diagnosis not present

## 2023-04-16 ENCOUNTER — Encounter (HOSPITAL_COMMUNITY): Payer: Self-pay | Admitting: Orthopedic Surgery

## 2023-04-28 DIAGNOSIS — H52209 Unspecified astigmatism, unspecified eye: Secondary | ICD-10-CM | POA: Diagnosis not present

## 2023-04-28 DIAGNOSIS — H524 Presbyopia: Secondary | ICD-10-CM | POA: Diagnosis not present

## 2023-04-28 DIAGNOSIS — H5213 Myopia, bilateral: Secondary | ICD-10-CM | POA: Diagnosis not present

## 2023-05-01 DIAGNOSIS — Z6841 Body Mass Index (BMI) 40.0 and over, adult: Secondary | ICD-10-CM | POA: Diagnosis not present

## 2023-05-01 DIAGNOSIS — D508 Other iron deficiency anemias: Secondary | ICD-10-CM | POA: Diagnosis not present

## 2023-05-05 DIAGNOSIS — Z96652 Presence of left artificial knee joint: Secondary | ICD-10-CM | POA: Diagnosis not present

## 2023-05-05 DIAGNOSIS — E669 Obesity, unspecified: Secondary | ICD-10-CM | POA: Diagnosis not present

## 2023-05-05 DIAGNOSIS — M25561 Pain in right knee: Secondary | ICD-10-CM | POA: Diagnosis not present

## 2023-05-05 DIAGNOSIS — M47816 Spondylosis without myelopathy or radiculopathy, lumbar region: Secondary | ICD-10-CM | POA: Diagnosis not present

## 2023-05-05 DIAGNOSIS — Z96651 Presence of right artificial knee joint: Secondary | ICD-10-CM | POA: Diagnosis not present

## 2023-05-05 DIAGNOSIS — Z79891 Long term (current) use of opiate analgesic: Secondary | ICD-10-CM | POA: Diagnosis not present

## 2023-05-05 DIAGNOSIS — M75102 Unspecified rotator cuff tear or rupture of left shoulder, not specified as traumatic: Secondary | ICD-10-CM | POA: Diagnosis not present

## 2023-05-05 DIAGNOSIS — Z1389 Encounter for screening for other disorder: Secondary | ICD-10-CM | POA: Diagnosis not present

## 2023-05-05 DIAGNOSIS — M1711 Unilateral primary osteoarthritis, right knee: Secondary | ICD-10-CM | POA: Diagnosis not present

## 2023-05-06 DIAGNOSIS — M47816 Spondylosis without myelopathy or radiculopathy, lumbar region: Secondary | ICD-10-CM | POA: Diagnosis not present

## 2023-05-07 ENCOUNTER — Other Ambulatory Visit: Payer: Self-pay | Admitting: *Deleted

## 2023-05-08 ENCOUNTER — Ambulatory Visit: Payer: Medicare HMO | Admitting: Internal Medicine

## 2023-05-08 DIAGNOSIS — M359 Systemic involvement of connective tissue, unspecified: Secondary | ICD-10-CM

## 2023-05-08 DIAGNOSIS — J8489 Other specified interstitial pulmonary diseases: Secondary | ICD-10-CM

## 2023-05-08 DIAGNOSIS — J849 Interstitial pulmonary disease, unspecified: Secondary | ICD-10-CM | POA: Diagnosis not present

## 2023-05-08 DIAGNOSIS — G8929 Other chronic pain: Secondary | ICD-10-CM

## 2023-05-08 LAB — PULMONARY FUNCTION TEST
DL/VA % pred: 94 %
DL/VA: 3.92 ml/min/mmHg/L
DLCO cor % pred: 56 %
DLCO cor: 14.28 ml/min/mmHg
DLCO unc % pred: 56 %
DLCO unc: 14.28 ml/min/mmHg
FEF 25-75 Pre: 2.5 L/s
FEF2575-%Pred-Pre: 80 %
FEV1-%Pred-Pre: 54 %
FEV1-Pre: 1.84 L
FEV1FVC-%Pred-Pre: 107 %
FEV6-%Pred-Pre: 51 %
FEV6-Pre: 2.13 L
FEV6FVC-%Pred-Pre: 102 %
FVC-%Pred-Pre: 50 %
FVC-Pre: 2.13 L
Pre FEV1/FVC ratio: 86 %
Pre FEV6/FVC Ratio: 100 %

## 2023-05-08 NOTE — Progress Notes (Signed)
Spirometry/DLCO performed today. 

## 2023-05-08 NOTE — Patient Instructions (Signed)
Spirometry/DLCO performed today. 

## 2023-05-11 NOTE — Progress Notes (Signed)
 This encounter was created in error - please disregard.

## 2023-05-11 NOTE — Patient Instructions (Signed)
Interstitial lung disease due to connective tissue disease (HCC) High risk medication use Immunosuppressed status  STable CT March 2023 Clinically stable with symptoms and exercise hypoemia test 10/29/2022    Plan ' -Continue CellCept through the rheumatologist at [facility] -Continue Plaquenil through the rheumatologist at [facility] -Noted that you are off prednisone since 2021  -If pulmonary fibrosis gets worse then we can add nintedanib and consider you for research registry study called ILD-Pro -Get spirometry and DLCO in 6 months (cancel PFT 10/30/22 )   History of snoring Witnessed apneic spells OSA    - glad you are seeing Dr Val Eagle and are on CPAP  Plan  - per Dr Val Eagle   Low back pain  Plan   - get ok from your doctor and try stretch zone, stretch lab or stretch med  Follow-up -Spirometry and DLCO in 4  months -Return to see Dr. Marchelle Gearing 15-minute ILD slot in 4 months  - symptoms score and walk test in 4 months

## 2023-05-12 ENCOUNTER — Encounter: Payer: Medicare HMO | Admitting: Internal Medicine

## 2023-05-15 DIAGNOSIS — Z6841 Body Mass Index (BMI) 40.0 and over, adult: Secondary | ICD-10-CM | POA: Diagnosis not present

## 2023-05-15 DIAGNOSIS — E559 Vitamin D deficiency, unspecified: Secondary | ICD-10-CM | POA: Diagnosis not present

## 2023-05-22 DIAGNOSIS — Z79899 Other long term (current) drug therapy: Secondary | ICD-10-CM | POA: Diagnosis not present

## 2023-05-22 DIAGNOSIS — M3210 Systemic lupus erythematosus, organ or system involvement unspecified: Secondary | ICD-10-CM | POA: Diagnosis not present

## 2023-05-28 DIAGNOSIS — M7918 Myalgia, other site: Secondary | ICD-10-CM | POA: Diagnosis not present

## 2023-05-28 DIAGNOSIS — M545 Low back pain, unspecified: Secondary | ICD-10-CM | POA: Diagnosis not present

## 2023-06-03 ENCOUNTER — Encounter: Payer: Self-pay | Admitting: Internal Medicine

## 2023-06-03 DIAGNOSIS — J849 Interstitial pulmonary disease, unspecified: Secondary | ICD-10-CM | POA: Diagnosis not present

## 2023-06-03 DIAGNOSIS — Z90710 Acquired absence of both cervix and uterus: Secondary | ICD-10-CM | POA: Diagnosis not present

## 2023-06-03 DIAGNOSIS — M3213 Lung involvement in systemic lupus erythematosus: Secondary | ICD-10-CM | POA: Diagnosis not present

## 2023-06-03 DIAGNOSIS — M549 Dorsalgia, unspecified: Secondary | ICD-10-CM | POA: Diagnosis not present

## 2023-06-05 DIAGNOSIS — M47816 Spondylosis without myelopathy or radiculopathy, lumbar region: Secondary | ICD-10-CM | POA: Diagnosis not present

## 2023-06-09 DIAGNOSIS — Z96651 Presence of right artificial knee joint: Secondary | ICD-10-CM | POA: Diagnosis not present

## 2023-06-09 DIAGNOSIS — M47816 Spondylosis without myelopathy or radiculopathy, lumbar region: Secondary | ICD-10-CM | POA: Diagnosis not present

## 2023-06-09 DIAGNOSIS — E669 Obesity, unspecified: Secondary | ICD-10-CM | POA: Diagnosis not present

## 2023-06-09 DIAGNOSIS — Z96652 Presence of left artificial knee joint: Secondary | ICD-10-CM | POA: Diagnosis not present

## 2023-06-09 DIAGNOSIS — Z79891 Long term (current) use of opiate analgesic: Secondary | ICD-10-CM | POA: Diagnosis not present

## 2023-06-09 DIAGNOSIS — M1711 Unilateral primary osteoarthritis, right knee: Secondary | ICD-10-CM | POA: Diagnosis not present

## 2023-06-09 DIAGNOSIS — M25561 Pain in right knee: Secondary | ICD-10-CM | POA: Diagnosis not present

## 2023-06-09 DIAGNOSIS — M75102 Unspecified rotator cuff tear or rupture of left shoulder, not specified as traumatic: Secondary | ICD-10-CM | POA: Diagnosis not present

## 2023-06-09 DIAGNOSIS — Z1389 Encounter for screening for other disorder: Secondary | ICD-10-CM | POA: Diagnosis not present

## 2023-06-16 DIAGNOSIS — G4733 Obstructive sleep apnea (adult) (pediatric): Secondary | ICD-10-CM | POA: Diagnosis not present

## 2023-06-23 ENCOUNTER — Encounter (INDEPENDENT_AMBULATORY_CARE_PROVIDER_SITE_OTHER): Payer: Self-pay | Admitting: Adult Health

## 2023-06-23 ENCOUNTER — Ambulatory Visit (INDEPENDENT_AMBULATORY_CARE_PROVIDER_SITE_OTHER): Payer: BLUE CROSS/BLUE SHIELD | Admitting: Adult Health

## 2023-06-23 VITALS — BP 129/85 | HR 86 | Temp 98.0°F | Ht 71.0 in | Wt 283.0 lb

## 2023-06-23 DIAGNOSIS — Z0000 Encounter for general adult medical examination without abnormal findings: Secondary | ICD-10-CM

## 2023-06-23 DIAGNOSIS — Z0289 Encounter for other administrative examinations: Secondary | ICD-10-CM

## 2023-06-23 DIAGNOSIS — E669 Obesity, unspecified: Secondary | ICD-10-CM | POA: Diagnosis not present

## 2023-06-23 DIAGNOSIS — E876 Hypokalemia: Secondary | ICD-10-CM | POA: Diagnosis not present

## 2023-06-23 DIAGNOSIS — Z6839 Body mass index (BMI) 39.0-39.9, adult: Secondary | ICD-10-CM

## 2023-06-23 NOTE — Progress Notes (Signed)
 Office: [phone]  /  Fax: [phone]   Initial Visit  Shannon [name] was seen in clinic today to evaluate for obesity. She is interested in losing weight to improve overall health and reduce the risk of weight related complications. She presents today to review program treatment options, initial physical assessment, and evaluation.     She was referred by: Specialist  When asked what else they would like to accomplish? She states: Adopt healthier eating patterns, Improve energy levels and physical activity, Improve existing medical conditions, and Target Weight 200 lbs  Weight history: She reports depression sx's in 2023- weight gain ensued.  When asked how has your weight affected you? She states: Contributed to medical problems, Contributed to orthopedic problems or mobility issues, Having fatigue, Having poor endurance, and Problems with eating patterns  Some associated conditions: Hypertension, Hyperlipidemia, Lung disease, and Connective tissue disease: SLE  Contributing factors: Disruption of circadian rhythm / sleep disordered breathing, Consumption of processed foods, Use of obesogenic medications: Beta-blockers and Psychotropic medications, Reduced physical activity, Eating patterns, and Mental health problems  Weight promoting medications identified: Beta-blockers and Psychotropic medications  Current nutrition plan: None  Current level of physical activity: None  Current or previous pharmacotherapy: GLP-1 and Other: B12 Injections She received compounded semaglutide from [facility] She stopped GLP-1 therapy prior to Oct 2024 back surgery. She never restarted- lost to f/u at [facility]  Response to medication: Lost weight initially but was unable to sustain weight loss   Past medical history includes:   Past Medical History:  Diagnosis Date   Anemia    Anxiety    Depression    Dyspnea    Sometimes at rest   G6PD deficiency    GERD (gastroesophageal  reflux disease)    Hypertension    ILD (interstitial lung disease) (HCC)    Lupus    Pneumonia    Rheumatoid arthritis (HCC)    Sleep apnea      Objective:   BP 129/85   Pulse 86   Temp 98 F (36.7 C)   Ht 5' 11 (1.803 m)   Wt 283 lb (128.4 kg)   LMP 04/28/2015   SpO2 98%   BMI 39.47 kg/m  She was weighed on the bioimpedance scale: Body mass index is 39.47 kg/m.  Peak Weight:307 , Body Fat%:48.5, Visceral Fat Rating:14, Weight trend over the last 12 months: Increasing  General:  Alert, oriented and cooperative. Patient is in no acute distress.  Respiratory: Normal respiratory effort, no problems with respiration noted   Gait: able to ambulate independently  Mental Status: Normal mood and affect. Normal behavior. Normal judgment and thought content.   DIAGNOSTIC DATA REVIEWED:  BMET    Component Value Date/Time   NA 138 03/20/2023 1237   K 3.2 (L) 03/20/2023 1237   CL 106 03/20/2023 1237   CO2 23 03/20/2023 1237   GLUCOSE 96 03/20/2023 1237   BUN 11 03/20/2023 1237   CREATININE 0.95 03/20/2023 1237   CREATININE 0.78 07/14/2018 1024   CALCIUM 8.7 (L) 03/20/2023 1237   GFRNONAA >60 03/20/2023 1237   GFRNONAA 99 02/26/2018 1053   GFRAA >60 02/08/2020 1130   GFRAA 115 02/26/2018 1053   Lab Results  Component Value Date   HGBA1C 4.9 12/19/2015   No results found for: INSULIN  CBC    Component Value Date/Time   WBC 5.5 03/20/2023 1237   RBC 3.65 (L) 03/20/2023 1237   HGB 11.1 (L) 03/20/2023 1237   HCT  34.4 (L) 03/20/2023 1237   PLT 247 03/20/2023 1237   MCV 94.2 03/20/2023 1237   MCH 30.4 03/20/2023 1237   MCHC 32.3 03/20/2023 1237   RDW 13.1 03/20/2023 1237   Iron/TIBC/Ferritin/ %Sat    Component Value Date/Time   IRON 28 (L) 05/13/2019 1243   IRONPCTSAT 10.8 (L) 05/13/2019 1243   Lipid Panel     Component Value Date/Time   CHOL 127 08/05/2017 0940   TRIG 86 08/05/2017 0940   HDL 39 (L) 08/05/2017 0940   CHOLHDL 3.3 08/05/2017 0940   VLDL 17  08/05/2017 0940   LDLCALC 71 08/05/2017 0940   Hepatic Function Panel     Component Value Date/Time   PROT 8.1 04/23/2020 1147   ALBUMIN 4.1 04/23/2020 1147   AST 24 04/23/2020 1147   ALT 28 04/23/2020 1147   ALKPHOS 99 04/23/2020 1147   BILITOT 0.5 04/23/2020 1147   BILIDIR 0.1 05/13/2019 1243      Component Value Date/Time   TSH 1.870 12/19/2015 1347     Assessment and Plan:   Healthcare maintenance  Hypokalemia  Obesity (BMI 30-39.9), Starting BMI 39.49  ESTABLISH WITH HWW   Obesity Treatment / Action Plan:  Patient will work on garnering support from family and friends to begin weight loss journey. Will work on eliminating or reducing the presence of highly palatable, calorie dense foods in the home. Will complete provided nutritional and psychosocial assessment questionnaire before the next appointment. Will be scheduled for indirect calorimetry to determine resting energy expenditure in a fasting state.  This will allow us  to create a reduced calorie, high-protein meal plan to promote loss of fat mass while preserving muscle mass. Counseled on the health benefits of losing 5%-15% of total body weight. Was counseled on nutritional approaches to weight loss and benefits of reducing processed foods and consuming plant-based foods and high quality protein as part of nutritional weight management. Was counseled on pharmacotherapy and role as an adjunct in weight management.   Obesity Education Performed Today:  She was weighed on the bioimpedance scale and results were discussed and documented in the synopsis.  We discussed obesity as a disease and the importance of a more detailed evaluation of all the factors contributing to the disease.  We discussed the importance of long term lifestyle changes which include nutrition, exercise and behavioral modifications as well as the importance of customizing this to her specific health and social needs.  We discussed the  benefits of reaching a healthier weight to alleviate the symptoms of existing conditions and reduce the risks of the biomechanical, metabolic and psychological effects of obesity.  [name] appears to be in the action stage of change and states they are ready to start intensive lifestyle modifications and behavioral modifications.  30 minutes was spent today on this visit including the above counseling, pre-visit chart review, and post-visit documentation.  Reviewed by clinician on day of visit: allergies, medications, problem list, medical history, surgical history, family history, social history, and previous encounter notes pertinent to obesity diagnosis.  Adilynn Bessey d. Walterine Amodei, NP-C

## 2023-06-30 ENCOUNTER — Ambulatory Visit
Admission: RE | Admit: 2023-06-30 | Discharge: 2023-06-30 | Disposition: A | Discharge status: Home / Self Care | Payer: Medicare HMO | Source: Ambulatory Visit | Attending: Family Medicine | Admitting: Family Medicine

## 2023-06-30 VITALS — BP 144/93 | HR 95 | Temp 97.9°F | Resp 18

## 2023-06-30 DIAGNOSIS — R519 Headache, unspecified: Secondary | ICD-10-CM | POA: Diagnosis not present

## 2023-06-30 DIAGNOSIS — R0981 Nasal congestion: Secondary | ICD-10-CM | POA: Diagnosis not present

## 2023-06-30 DIAGNOSIS — B349 Viral infection, unspecified: Secondary | ICD-10-CM

## 2023-06-30 LAB — POC COVID19/FLU A&B COMBO
Covid Antigen, POC: NEGATIVE
Influenza A Antigen, POC: NEGATIVE
Influenza B Antigen, POC: NEGATIVE

## 2023-06-30 MED ORDER — KETOROLAC TROMETHAMINE 30 MG/ML IJ SOLN
30.0000 mg | Freq: Once | INTRAMUSCULAR | Status: AC
  Administered 2023-06-30: 30 mg via INTRAMUSCULAR

## 2023-06-30 MED ORDER — FLUTICASONE PROPIONATE 50 MCG/ACT NA SUSP: 1.0000 | Freq: Every day | NASAL | 0 refills | Status: DC

## 2023-06-30 NOTE — ED Provider Notes (Signed)
 [facility]    CSN: [id number] Arrival date & time: 06/30/23  1725      History   Chief Complaint Chief Complaint  Patient presents with   Migraine    I think I might have a sinus infection. I have phlegm in my throat and my head has been throbbing since 06/27/23 - Entered by patient    HPI Shannon [name] is a 53 y.o. female  presents for evaluation of URI symptoms for 3 days. Patient reports associated symptoms of congestion, headache, sinus pressure. Denies N/V/D, cough, sore throat, fevers, ear pain, body aches, shortness of breath.  She states she is a history of migraines but states this is not consistent with previous migraines.  Also reports she has not had a migraine at least 2 years.  Currently rates her headache as an 8 out of 10 and denies worst headache of life.  Denies thunderclap headache.  No family history of first-degree relative with SAH.  Patient does not have a hx of asthma.  Does have a history of interstitial lung disease but denies shortness of breath or wheezing.  Patient is not an active smoker.   Reports no sick contacts.  Pt has taken Tylenol  and sinus medication OTC for symptoms. Pt has no other concerns at this time.    Migraine Associated symptoms include headaches.    Past Medical History:  Diagnosis Date   Anemia    Anxiety    Depression    Dyspnea    Sometimes at rest   G6PD deficiency    GERD (gastroesophageal reflux disease)    Hypertension    ILD (interstitial lung disease) (HCC)    Lupus    Pneumonia    Rheumatoid arthritis (HCC)    Sleep apnea     Patient Active Problem List   Diagnosis Date Noted   Chronic pain 03/30/2023   Preoperative clearance 06/10/2022   Lumbar spondylosis 03/19/2022   Chronic low back pain 03/06/2022   Spondylosis without myelopathy 03/06/2022   Upper airway cough syndrome 12/13/2021   Degenerative disc disease at L5-S1 level 09/25/2021   Cervical radiculopathy 06/07/2021   Lumbar radiculopathy  11/22/2020   Pain of left hip joint 08/22/2020   Stiffness of right knee 05/03/2020   Primary osteoarthritis of right knee 04/30/2020   Sleep apnea 04/27/2020   Anemia 09/22/2019   Stiffness of left knee 04/08/2019   OA (osteoarthritis) of knee 04/04/2019   Acute bronchitis 02/13/2017   Cough variant asthma  vs uacs  12/17/2016   Morbid obesity due to excess calories (HCC) 12/17/2016   Contracture of left elbow 11/21/2016   Primary osteoarthritis of both knees 11/21/2016   Pain in right hand 09/09/2016   Pain in left hand 09/09/2016   History of osteoarthritis 09/09/2016   Rheumatoid factor positive 08/01/2016   Laryngopharyngeal reflux (LPR) 08/23/2015   High risk medication use 01/16/2015   Other long term (current) drug therapy 01/16/2015   G6PD deficiency 10/31/2014   Dyspnea 08/23/2014   ILD (interstitial lung disease) (HCC) 08/23/2014   Other organ or system involvement in systemic lupus erythematosus (HCC) 08/23/2014   Rheumatoid arthritis (HCC) 08/23/2014   Raynaud disease 08/08/2014    Past Surgical History:  Procedure Laterality Date   ABDOMINAL HYSTERECTOMY     APPENDECTOMY  1980   cardiac catherization  08/20/2017   CARDIAC CATHETERIZATION N/A 05/10/2015   Procedure: Right Heart Cath;  Surgeon: Ezra GORMAN Shuck, MD;  Location: [facility] INVASIVE CV LAB;  Service: Cardiovascular;  Laterality: N/A;   CESAREAN SECTION  '95, '02, '07   X 3   CHOLECYSTECTOMY  2010   LAPAROSCOPIC HYSTERECTOMY  10/2015   have ovaries   RIGHT HEART CATH N/A 08/20/2017   Procedure: RIGHT HEART CATH;  Surgeon: Rolan Ezra RAMAN, MD;  Location: [facility] INVASIVE CV LAB;  Service: Cardiovascular;  Laterality: N/A;   SHOULDER ARTHROSCOPY WITH ROTATOR CUFF REPAIR AND SUBACROMIAL DECOMPRESSION Left 08/13/2022   Procedure: SHOULDER ARTHROSCOPY WITH SUBACROMIAL DECOMPRESSION, DEBRIDEMENT AND PARTIAL ROTATOR CUFF REPAIR;  Surgeon: Duwayne Purchase, MD;  Location: [facility];  Service: Orthopedics;  Laterality: Left;    SHOULDER ARTHROSCOPY WITH SUBACROMIAL DECOMPRESSION Right 08/28/2021   Procedure: SHOULDER ARTHROSCOPY WITH SUBACROMIAL DECOMPRESSION AND DEBRIDEMENT;  Surgeon: Duwayne Purchase, MD;  Location: [facility];  Service: Orthopedics;  Laterality: Right;   SPINAL CORD STIMULATOR INSERTION N/A 03/30/2023   Procedure: SPINAL CORD STIMULATOR INSERTION;  Surgeon: Burnetta Aures, MD;  Location: [facility];  Service: Orthopedics;  Laterality: N/A;  3 C-Bed   TOTAL KNEE ARTHROPLASTY Left 04/04/2019   Procedure: TOTAL KNEE ARTHROPLASTY;  Surgeon: Melodi Lerner, MD;  Location: [facility];  Service: Orthopedics;  Laterality: Left;    TOTAL KNEE ARTHROPLASTY Right 04/30/2020   Procedure: TOTAL KNEE ARTHROPLASTY;  Surgeon: Melodi Lerner, MD;  Location: [facility];  Service: Orthopedics;  Laterality: Right;    TUBAL LIGATION      OB History   No obstetric history on file.      Home Medications    Prior to Admission medications   Medication Sig Start Date End Date Taking? Authorizing Provider  fluticasone  (FLONASE ) 50 MCG/ACT nasal spray Place 1 spray into both nostrils daily. 06/30/23  Yes Tome Wilson, Jodi R, NP  albuterol  (VENTOLIN  HFA) 108 (90 Base) MCG/ACT inhaler Inhale 2 puffs into the lungs every 6 (six) hours as needed for wheezing or shortness of breath. 11/04/22   Geronimo Amel, MD  amitriptyline  (ELAVIL ) 100 MG tablet Take 100 mg by mouth at bedtime. 05/28/22   [provider]  Budeson-Glycopyrrol-Formoterol  (BREZTRI  AEROSPHERE) 160-9-4.8 MCG/ACT AERO Inhale 2 puffs into the lungs in the morning and at bedtime. 03/18/23   Hope Almarie ORN, NP  buPROPion  (WELLBUTRIN  XL) 300 MG 24 hr tablet Take 300 mg by mouth daily.    [provider]  busPIRone  (BUSPAR ) 15 MG tablet Take 15 mg by mouth daily.    [provider]  cetirizine  (ZYRTEC ) 10 MG tablet TAKE 1 TABLET BY MOUTH AT BEDTIME 04/07/23   Hope Almarie ORN, NP  hydroxychloroquine  (PLAQUENIL ) 200 MG tablet Take 400 mg by mouth at  bedtime.    [provider]  lisinopril  (ZESTRIL ) 10 MG tablet Take 10 mg by mouth daily.    [provider]  metoprolol  succinate (TOPROL -XL) 25 MG 24 hr tablet Take 25 mg by mouth daily. 03/07/23   [provider]  montelukast  (SINGULAIR ) 10 MG tablet TAKE 1 TABLET BY MOUTH AT BEDTIME 12/08/22   Geronimo Amel, MD  mycophenolate  (CELLCEPT ) 250 MG capsule Take 500 mg by mouth 2 (two) times daily.    [provider]  MYRBETRIQ  25 MG TB24 tablet Take 25 mg by mouth daily.    [provider]  QUEtiapine  (SEROQUEL  XR) 300 MG 24 hr tablet Take 300 mg by mouth at bedtime.    [provider]  sertraline  (ZOLOFT ) 100 MG tablet Take 200 mg by mouth at bedtime. 02/23/18   [provider]    Family History Family History  Problem Relation Age of Onset   Sarcoidosis Mother    Lupus Sister    Healthy Daughter    Healthy Son    Healthy Son     Social History Social History   Tobacco Use   Smoking status: Former    Current packs/day: 0.00    Average packs/day: 0.5 packs/day for 14.0 years (7.0 ttl pk-yrs)    Types: Cigarettes    Start date: 09/24/2000    Quit date: 09/25/2014    Years since quitting: 8.7   Smokeless tobacco: Never  Vaping Use   Vaping status: Never Used  Substance Use Topics   Alcohol use: Yes    Alcohol/week: 0.0 standard drinks of alcohol    Comment: occas.   Drug use: No     Allergies   Asa [aspirin ], Penicillins, and Sulfa antibiotics   Review of Systems Review of Systems  HENT:  Positive for congestion.   Neurological:  Positive for headaches.     Physical Exam Triage Vital Signs ED Triage Vitals [06/30/23 1747]  Encounter Vitals Group     BP (!) 175/107     Systolic BP Percentile      Diastolic BP Percentile      Pulse Rate 95     Resp 18     Temp 97.9 F (36.6 C)     Temp Source Oral     SpO2 97 %     Weight      Height      Head Circumference      Peak Flow      Pain Score 8      Pain Loc      Pain Education      Exclude from Growth Chart    No data found.  Updated Vital Signs BP (!) 144/93 (BP Location: Right Arm)   Pulse 95   Temp 97.9 F (36.6 C) (Oral)   Resp 18   LMP 04/28/2015   SpO2 97%   Visual Acuity Right Eye Distance:   Left Eye Distance:   Bilateral Distance:    Right Eye Near:   Left Eye Near:    Bilateral Near:     Physical Exam Vitals and nursing note reviewed.  Constitutional:      General: She is not in acute distress.    Appearance: She is well-developed. She is not ill-appearing.  HENT:     Head: Normocephalic and atraumatic.     Right Ear: Tympanic membrane and ear canal normal.     Left Ear: Tympanic membrane and ear canal normal.     Nose: Congestion present.     Mouth/Throat:     Mouth: Mucous membranes are moist.     Pharynx: Oropharynx is clear. Uvula midline. No oropharyngeal exudate or posterior oropharyngeal erythema.     Tonsils: No tonsillar exudate or tonsillar abscesses.  Eyes:     Conjunctiva/sclera: Conjunctivae normal.     Pupils: Pupils are equal, round, and reactive to light.  Cardiovascular:     Rate and Rhythm: Normal rate and regular rhythm.     Heart sounds: Normal heart sounds.  Pulmonary:     Effort: Pulmonary effort is normal.     Breath sounds: Normal breath sounds.  Musculoskeletal:     Cervical back: Normal range of motion and neck supple.  Lymphadenopathy:     Cervical: No cervical adenopathy.  Skin:    General: Skin is warm and dry.  Neurological:     General: No focal  deficit present.     Mental Status: She is alert and oriented to person, place, and time.     GCS: GCS eye subscore is 4. GCS verbal subscore is 5. GCS motor subscore is 6.     Cranial Nerves: No facial asymmetry.     Motor: No weakness.     Coordination: Finger-Nose-Finger Test normal.  Psychiatric:        Mood and Affect: Mood normal.        Behavior: Behavior normal.      UC Treatments / Results  Labs (all  labs ordered are listed, but only abnormal results are displayed) Labs Reviewed  POC COVID19/FLU A&B COMBO    EKG   Radiology No results found.  Procedures Procedures (including critical care time)  Medications Ordered in UC Medications  ketorolac  (TORADOL ) 30 MG/ML injection 30 mg (30 mg Intramuscular Given 06/30/23 1828)    Initial Impression / Assessment and Plan / UC Course  I have reviewed the triage vital signs and the nursing notes.  Pertinent labs & imaging results that were available during my care of the patient were reviewed by me and considered in my medical decision making (see chart for details).     Reviewed exam and symptoms with patient.  No red flags.  Negative rapid flu and COVID testing.  Patient given Toradol  injection in clinic for headache.  She was monitored for 10 minutes after injection with no reaction noted and tolerated well.  Reports improvement in headache.  Discussed likely viral illness as cause of symptoms.  Will do trial of Flonase .  Encouraged Tylenol  nasal rinses as needed.  Salt water  gargles and warm liquids.  PCP follow-up if symptoms do not improve.  ER precautions reviewed. Final Clinical Impressions(s) / UC Diagnoses   Final diagnoses:  Nasal congestion  Acute nonintractable headache, unspecified headache type  Viral illness     Discharge Instructions      You were given a Toradol  injection in clinic today. Do not take any over the counter NSAID's such as Advil , ibuprofen , Aleve, or naproxen for 24 hours.  You may take tylenol  if needed.  Flonase  daily.  Nasal rinses as tolerated.  Lots of rest and fluids.  Please follow-up with your PCP if your symptoms do not improve.  Please go to the ER for any worsening symptoms.  I hope you feel better soon!      ED Prescriptions     Medication Sig Dispense Auth. Provider   fluticasone  (FLONASE ) 50 MCG/ACT nasal spray Place 1 spray into both nostrils daily. 15.8 mL [place] Maiolo, Jodi R, NP       PDMP not reviewed this encounter.   Loreda Myla SAUNDERS, NP 06/30/23 [phone]

## 2023-06-30 NOTE — ED Triage Notes (Signed)
 Pt presents with c/o phlegm in her throat and headaches X 01/04. Pt states she thought she had a sinus infection and has taken OTC medicine.   Pt states she has not had a fever

## 2023-06-30 NOTE — Discharge Instructions (Addendum)
 You were given a Toradol  injection in clinic today. Do not take any over the counter NSAID's such as Advil , ibuprofen , Aleve, or naproxen for 24 hours.  You may take tylenol  if needed.  Flonase  daily.  Nasal rinses as tolerated.  Lots of rest and fluids.  Please follow-up with your PCP if your symptoms do not improve.  Please go to the ER for any worsening symptoms.  I hope you feel better soon!

## 2023-07-06 DIAGNOSIS — M47816 Spondylosis without myelopathy or radiculopathy, lumbar region: Secondary | ICD-10-CM | POA: Diagnosis not present

## 2023-07-14 DIAGNOSIS — F411 Generalized anxiety disorder: Secondary | ICD-10-CM | POA: Diagnosis not present

## 2023-07-14 DIAGNOSIS — F332 Major depressive disorder, recurrent severe without psychotic features: Secondary | ICD-10-CM | POA: Diagnosis not present

## 2023-07-21 DIAGNOSIS — N951 Menopausal and female climacteric states: Secondary | ICD-10-CM | POA: Diagnosis not present

## 2023-07-21 DIAGNOSIS — Z6841 Body Mass Index (BMI) 40.0 and over, adult: Secondary | ICD-10-CM | POA: Diagnosis not present

## 2023-07-21 DIAGNOSIS — I10 Essential (primary) hypertension: Secondary | ICD-10-CM | POA: Diagnosis not present

## 2023-07-21 DIAGNOSIS — N3281 Overactive bladder: Secondary | ICD-10-CM | POA: Diagnosis not present

## 2023-07-28 ENCOUNTER — Ambulatory Visit (INDEPENDENT_AMBULATORY_CARE_PROVIDER_SITE_OTHER): Payer: Medicare HMO | Admitting: Family Medicine

## 2023-07-28 ENCOUNTER — Encounter (INDEPENDENT_AMBULATORY_CARE_PROVIDER_SITE_OTHER): Payer: Self-pay | Admitting: Family Medicine

## 2023-07-28 VITALS — BP 144/92 | HR 91 | Temp 98.2°F | Ht 71.0 in | Wt 292.0 lb

## 2023-07-28 DIAGNOSIS — G4730 Sleep apnea, unspecified: Secondary | ICD-10-CM | POA: Diagnosis not present

## 2023-07-28 DIAGNOSIS — E66813 Obesity, class 3: Secondary | ICD-10-CM

## 2023-07-28 DIAGNOSIS — Z6841 Body Mass Index (BMI) 40.0 and over, adult: Secondary | ICD-10-CM | POA: Diagnosis not present

## 2023-07-28 DIAGNOSIS — R739 Hyperglycemia, unspecified: Secondary | ICD-10-CM | POA: Diagnosis not present

## 2023-07-28 DIAGNOSIS — R5383 Other fatigue: Secondary | ICD-10-CM

## 2023-07-28 DIAGNOSIS — E559 Vitamin D deficiency, unspecified: Secondary | ICD-10-CM

## 2023-07-28 DIAGNOSIS — R0602 Shortness of breath: Secondary | ICD-10-CM

## 2023-07-28 DIAGNOSIS — M3213 Lung involvement in systemic lupus erythematosus: Secondary | ICD-10-CM | POA: Diagnosis not present

## 2023-07-28 DIAGNOSIS — I10 Essential (primary) hypertension: Secondary | ICD-10-CM | POA: Diagnosis not present

## 2023-07-28 DIAGNOSIS — F32A Depression, unspecified: Secondary | ICD-10-CM | POA: Diagnosis not present

## 2023-07-28 DIAGNOSIS — F419 Anxiety disorder, unspecified: Secondary | ICD-10-CM

## 2023-07-28 DIAGNOSIS — G4733 Obstructive sleep apnea (adult) (pediatric): Secondary | ICD-10-CM

## 2023-07-28 NOTE — Assessment & Plan Note (Signed)
 Starting weight: 292 Peak weight:  BMR: 1956 Previous obesity management: b12 and semaglutide injections thru a weight loss clinic- lost 20+ lbs Body Fat %:  Starting Meal Plan:  Meal Plan needs:  Likes/ Dislikes of meal plan: doesn't like some vegetables

## 2023-07-28 NOTE — Assessment & Plan Note (Addendum)
Sees provider in [facility].  On seroquel, buspar, zoloft, amitryptyline, wellbutrin.  Sees Crystal Tressie Ellis who is doing therapy and prescribing medication for her.

## 2023-07-28 NOTE — Assessment & Plan Note (Signed)
On OTC Vitamin D- still having fatigue but likely due to lupus (per patient).  Vitamin D level today.

## 2023-07-28 NOTE — Assessment & Plan Note (Signed)
Sleep study done in 2021 showing mild OSA.  Has been consistent with wearing CPAP for the last 3 months.

## 2023-07-28 NOTE — Assessment & Plan Note (Signed)
Patient sees [name].  She is on combination of medication.

## 2023-07-28 NOTE — Assessment & Plan Note (Signed)
Sporadic blood sugar elevations over the last few years.  No A1c on Epic.  A1c and insulin level today.

## 2023-07-28 NOTE — Assessment & Plan Note (Signed)
On lisinopril daily.  Never been on other medications- BP controlled today.

## 2023-07-28 NOTE — Progress Notes (Signed)
 Chief Complaint:  Obesity   Subjective:  Shannon [name] (MR# [id number]) is a 53 y.o. female who presents for evaluation and treatment of obesity and related comorbidities.   Shannon [name] is currently in the action stage of change and ready to dedicate time achieving and maintaining a healthier weight. Shannon [name] is interested in becoming our patient and working on intensive lifestyle modifications including (but not limited to) diet and exercise for weight loss.  Shannon [name] has been struggling with her weight. She has been unsuccessful in either losing weight, maintaining weight loss, or reaching her healthy weight goal.  Patient may be interested in bariatric surgery- has considered it in the past.  Lives at home with her husband Shannon [name]), Son Shannon [name]) and daughter Shannon [name]).  She mentions family is supportive of her, they eat meals together and she isn't sure if all people will make changes in food intake with her.  Desired weight is less than 200lbs but she isn't sure when she was that weight last- possibly around 2015.  She struggles with emotional eating which she mentions really started when her sister passed. Skips meals due to lack of hunger.   Food Recall: Coffee in the am with creamer (international delight sweet and creamy) with cheerios and banana (eats 2 cups)- feels full or 2 eggs and 2 sausage patties- feels satisfied or 2 packs Maple Brown Sugar oatmeal- feels full.  Will eat again between 12:30 and 2pm.  Will eat a deli meat sandwich or leftovers.  3 slices meat on sandwich, miracle whip, mustard, provolone cheese on honey wheat bread and a piece of fruit. Feels satisfied.  Between 5-7pm will eat dinner like pulled pork, roasted golden potatoes (1/2 cup pulled pork, 1 cup potatoes).  Ate crackers after dinner.    Indirect Calorimeter completed today shows a RMR: 1956. Her calculated basal metabolic rate is 7864 thus her basal metabolic rate is worse than expected.  Other Fatigue Shannon [name] admits to daytime  somnolence and admits to waking up still tired. Patient has a history of symptoms of daytime fatigue, morning headache, and hypertension. Shannon [name] generally gets  5-8  hours of sleep per night, and states that she has generally restful sleep. Snoring is present. Apneic episodes are present. Epworth Sleepiness Score is 8.   Shortness of Breath Shannon [name] notes increasing shortness of breath with exercising and seems to be worsening over time with weight gain. She notes getting out of breath sooner with activity than she used to. This has not gotten worse recently. Shannon [name] denies shortness of breath at rest or orthopnea.  Depression Screen Shannon [name]'s Food and Mood (modified PHQ-9) score was 14.      No data to display           Objective:  No data recorded  No data recorded  No data recorded  No data recorded   EKG: Normal sinus rhythm, rate 103.  General: Cooperative, alert, well developed, in no acute distress. HEENT: Conjunctivae and lids unremarkable. Cardiovascular: Regular rhythm.  Lungs: Normal work of breathing. Neurologic: No focal deficits.   Lab Results  Component Value Date   CREATININE 0.89 07/30/2023   BUN 11 07/30/2023   NA 138 07/30/2023   K 4.0 07/30/2023   CL 103 07/30/2023   CO2 20 07/30/2023   Lab Results  Component Value Date   ALT 21 07/30/2023   AST 21 07/30/2023   ALKPHOS 125 (H) 07/30/2023   BILITOT <0.2 07/30/2023   Lab Results  Component Value Date   HGBA1C  5.2 07/30/2023   HGBA1C 4.9 12/19/2015   Lab Results  Component Value Date   INSULIN  45.8 (H) 07/30/2023   Lab Results  Component Value Date   TSH 3.330 07/30/2023   Lab Results  Component Value Date   CHOL 127 08/05/2017   HDL 39 (L) 08/05/2017   LDLCALC 71 08/05/2017   TRIG 86 08/05/2017   CHOLHDL 3.3 08/05/2017   Lab Results  Component Value Date   WBC 5.2 07/30/2023   HGB 10.6 (L) 07/30/2023   HCT 32.2 (L) 07/30/2023   MCV 89 07/30/2023   PLT 230 07/30/2023   Lab  Results  Component Value Date   IRON 28 (L) 05/13/2019    Assessment and Plan:   Other Fatigue  Shannon [name] does feel that her weight is causing her energy to be lower than it should be. Fatigue may be related to obesity, depression or many other causes. Labs will be ordered, and in the meanwhile, Shannon [name] will focus on self care including making healthy food choices, increasing physical activity and focusing on stress reduction.  Shortness of Breath  Shannon [name] does feel that she gets out of breath more easily that she used to when she exercises. 's shortness of breath appears to be obesity related and exercise induced. She has agreed to work on weight loss and gradually increase exercise to treat her exercise induced shortness of breath. Will continue to monitor closely.   Problem List Items Addressed This Visit       Cardiovascular and Mediastinum   Hypertension   On lisinopril  daily.  Never been on other medications- BP controlled today.      Relevant Orders   Comprehensive metabolic panel (Completed)     Respiratory   Sleep apnea   Sleep study done in 2021 showing mild OSA.  Has been consistent with wearing CPAP for the last 3 months.         Other   SLE (systemic lupus erythematosus) (HCC)   Patient sees [facility].  She is on combination of medication.      Relevant Orders   Vitamin B12 (Completed)   CBC with Differential/Platelet (Completed)   Folate (Completed)   Morbid obesity (HCC)   Starting weight: 292 Peak weight:  BMR: 1956 Previous obesity management: b12 and semaglutide injections thru a weight loss clinic- lost 20+ lbs Body Fat %:  Starting Meal Plan:  Meal Plan needs:  Likes/ Dislikes of meal plan: doesn't like some vegetables       Anxiety and depression   Sees provider in [facility]  On seroquel , buspar , zoloft , amitryptyline, wellbutrin .  Sees Shannon [name] who is doing therapy and prescribing medication for her.       Hyperglycemia    Sporadic blood sugar elevations over the last few years.  No A1c on Epic.  A1c and insulin  level today.      Relevant Orders   Hemoglobin A1c (Completed)   Insulin , random (Completed)   Vitamin D  deficiency   On OTC Vitamin D - still having fatigue but likely due to lupus (per patient).  Vitamin D  level today.      Relevant Orders   VITAMIN D  25 Hydroxy (Vit-D Deficiency, Fractures) (Completed)   Other Visit Diagnoses       Other fatigue    -  Primary   Relevant Orders   T4, free (Completed)   T3 (Completed)   TSH (Completed)     SOBOE (shortness of breath on exertion)  Class 3 severe obesity with serious comorbidity and body mass index (BMI) of 40.0 to 44.9 in adult, unspecified obesity type (HCC)           Shannon [name] is currently in the action stage of change and her goal is to continue with weight loss efforts. I recommend Shannon [name] begin the structured treatment plan as follows:  She has agreed to Category 3 Plan  Exercise goals: No exercise has been prescribed at this time.  Behavioral modification strategies:increasing lean protein intake, increasing vegetables, no skipping meals, and meal planning and cooking strategies  She was informed of the importance of frequent follow-up visits to maximize her success with intensive lifestyle modifications for her multiple health conditions. She was informed we would discuss her lab results at her next visit unless there is a critical issue that needs to be addressed sooner. Shannon [name] agreed to keep her next visit at the agreed upon time to discuss these results.   Attestation Statements:  Reviewed by clinician on day of visit: allergies, medications, problem list, medical history, surgical history, family history, social history, and previous encounter notes.  Time spent on visit including pre-visit chart review and post-visit charting and care was 60 minutes.   Adelita Cho, MD

## 2023-07-30 DIAGNOSIS — R5383 Other fatigue: Secondary | ICD-10-CM | POA: Diagnosis not present

## 2023-07-30 DIAGNOSIS — M3213 Lung involvement in systemic lupus erythematosus: Secondary | ICD-10-CM | POA: Diagnosis not present

## 2023-07-30 DIAGNOSIS — R739 Hyperglycemia, unspecified: Secondary | ICD-10-CM | POA: Diagnosis not present

## 2023-07-30 DIAGNOSIS — I10 Essential (primary) hypertension: Secondary | ICD-10-CM | POA: Diagnosis not present

## 2023-07-30 DIAGNOSIS — E559 Vitamin D deficiency, unspecified: Secondary | ICD-10-CM | POA: Diagnosis not present

## 2023-08-01 LAB — CBC WITH DIFFERENTIAL/PLATELET
Basophils Absolute: 0 10*3/uL (ref 0.0–0.2)
Basos: 0 %
EOS (ABSOLUTE): 0.1 10*3/uL (ref 0.0–0.4)
Eos: 2 %
Hematocrit: 32.2 % — ABNORMAL LOW (ref 34.0–46.6)
Hemoglobin: 10.6 g/dL — ABNORMAL LOW (ref 11.1–15.9)
Immature Grans (Abs): 0 10*3/uL (ref 0.0–0.1)
Immature Granulocytes: 0 %
Lymphocytes Absolute: 1.7 10*3/uL (ref 0.7–3.1)
Lymphs: 33 %
MCH: 29.4 pg (ref 26.6–33.0)
MCHC: 32.9 g/dL (ref 31.5–35.7)
MCV: 89 fL (ref 79–97)
Monocytes Absolute: 0.3 10*3/uL (ref 0.1–0.9)
Monocytes: 5 %
Neutrophils Absolute: 3.1 10*3/uL (ref 1.4–7.0)
Neutrophils: 60 %
Platelets: 230 10*3/uL (ref 150–450)
RBC: 3.61 x10E6/uL — ABNORMAL LOW (ref 3.77–5.28)
RDW: 13 % (ref 11.7–15.4)
WBC: 5.2 10*3/uL (ref 3.4–10.8)

## 2023-08-01 LAB — COMPREHENSIVE METABOLIC PANEL
ALT: 21 [IU]/L (ref 0–32)
AST: 21 [IU]/L (ref 0–40)
Albumin: 3.9 g/dL (ref 3.9–4.9)
Alkaline Phosphatase: 125 [IU]/L — ABNORMAL HIGH (ref 44–121)
BUN/Creatinine Ratio: 12 (ref 9–23)
BUN: 11 mg/dL (ref 6–24)
Bilirubin Total: <0.2 mg/dL (ref 0.0–1.2)
CO2: 20 mmol/L (ref 20–29)
Calcium: 8.6 mg/dL — ABNORMAL LOW (ref 8.7–10.2)
Chloride: 103 mmol/L (ref 96–106)
Creatinine, Ser: 0.89 mg/dL (ref 0.57–1.00)
Globulin, Total: 3.2 g/dL (ref 1.5–4.5)
Glucose: 101 mg/dL — ABNORMAL HIGH (ref 70–99)
Potassium: 4 mmol/L (ref 3.5–5.2)
Sodium: 138 mmol/L (ref 134–144)
Total Protein: 7.1 g/dL (ref 6.0–8.5)
eGFR: 79 mL/min/{1.73_m2} (ref >59)

## 2023-08-01 LAB — VITAMIN B12: Vitamin B-12: 408 pg/mL (ref 232–1245)

## 2023-08-01 LAB — INSULIN, RANDOM: INSULIN: 45.8 u[IU]/mL — ABNORMAL HIGH (ref 2.6–24.9)

## 2023-08-01 LAB — T3: T3, Total: 91 ng/dL (ref 71–180)

## 2023-08-01 LAB — VITAMIN D 25 HYDROXY (VIT D DEFICIENCY, FRACTURES): Vit D, 25-Hydroxy: 22.4 ng/mL — ABNORMAL LOW (ref 30.0–100.0)

## 2023-08-01 LAB — HEMOGLOBIN A1C
Est. average glucose Bld gHb Est-mCnc: 103 mg/dL
Hgb A1c MFr Bld: 5.2 % (ref 4.8–5.6)

## 2023-08-01 LAB — T4, FREE: Free T4: 0.92 ng/dL (ref 0.82–1.77)

## 2023-08-01 LAB — TSH: TSH: 3.33 u[IU]/mL (ref 0.450–4.500)

## 2023-08-01 LAB — FOLATE: Folate: 4.9 ng/mL (ref >3.0)

## 2023-08-06 DIAGNOSIS — M47816 Spondylosis without myelopathy or radiculopathy, lumbar region: Secondary | ICD-10-CM | POA: Diagnosis not present

## 2023-08-11 ENCOUNTER — Encounter (INDEPENDENT_AMBULATORY_CARE_PROVIDER_SITE_OTHER): Payer: Self-pay | Admitting: Family Medicine

## 2023-08-11 ENCOUNTER — Ambulatory Visit (INDEPENDENT_AMBULATORY_CARE_PROVIDER_SITE_OTHER): Payer: Medicare HMO | Admitting: Family Medicine

## 2023-08-11 VITALS — BP 128/82 | HR 99 | Temp 98.2°F | Ht 71.0 in | Wt 293.0 lb

## 2023-08-11 DIAGNOSIS — E559 Vitamin D deficiency, unspecified: Secondary | ICD-10-CM | POA: Diagnosis not present

## 2023-08-11 DIAGNOSIS — Z6841 Body Mass Index (BMI) 40.0 and over, adult: Secondary | ICD-10-CM

## 2023-08-11 DIAGNOSIS — E88819 Insulin resistance, unspecified: Secondary | ICD-10-CM | POA: Diagnosis not present

## 2023-08-11 DIAGNOSIS — I10 Essential (primary) hypertension: Secondary | ICD-10-CM

## 2023-08-11 DIAGNOSIS — E669 Obesity, unspecified: Secondary | ICD-10-CM

## 2023-08-11 MED ORDER — METFORMIN HCL 500 MG PO TABS: 500.0000 mg | ORAL_TABLET | Freq: Every day | ORAL | 0 refills | Status: DC

## 2023-08-11 MED ORDER — VITAMIN D (ERGOCALCIFEROL) 1.25 MG (50000 UNIT) PO CAPS: 50000.0000 [IU] | ORAL_CAPSULE | ORAL | 0 refills | Status: DC

## 2023-08-11 NOTE — Progress Notes (Signed)
 SUBJECTIVE:  Chief Complaint: Obesity  Interim History: Patient felt the first few weeks went ok- there was some food on the plan that she doesn't eat. She does not like cheese at all, doesn't like mustard, roast beef, ground [place].  She also loves potatoes and had a hard time not eating them.  She is lactose intolerant so can't tolerate much dairy.  She also only likes strawberries.  For snack calories she is doing protein bars, jello (sugar free), sugar free vanilla pudding, little cucumbers.  She misses grapes and potatoes.  For breakfast she is often opting for eggs (boiled), toast, [place] bacon and [place] sausage, oatmeal and honey nut cheerios.  For lunch she is eating quite a bit of deli meat and salad (macaroni and tuna), tomatoes, cucumbers or a microwave meal.  Dinner she has been doing quite a bit of baked food like chicken or ground beef, [place] wings, spinach, green beans.  She had pasta one night and is wondering if she allowed to have pasta.   Shannon [name] is here to discuss her progress with her obesity treatment plan. She is on the Category 3 Plan and states she is following her eating plan approximately 60 % of the time. She states she is not exercising.   OBJECTIVE: Visit Diagnoses: Problem List Items Addressed This Visit       Cardiovascular and Mediastinum   Hypertension - Primary   Blood pressure initially elevated on first reading; second reading better controlled.  No chest pain, chest pressure or headache.  She takes her medication in the am and did take it this am. Will continue current meds and patient will monitor her BP at home and bring log with her at next appointment.  No change in meds.        Endocrine   Insulin resistance   Pathophysiology of progression through insulin resistance to prediabetes and diabetes was discussed at length  today.  Patient to continue to monitor and be in control of total intake of snack calories which may be simple carbohydrates but should be consumed only after the patient has taken in all the nutrition for the day.  Macronutrient identification, classification and daily intake ratios were discussed.  Plan to repeat labs in 3 months to monitor both hemoglobin A1c and insulin levels.  Given patient's other medications and their weight gaining effects will start metformin today.  Side effects, mechanism of action and benefits were discussed today.  Metformin RX sent in.       Relevant Medications   metFORMIN (GLUCOPHAGE) 500 MG tablet     Other   Morbid obesity (HCC)   Starting weight: 292 Peak weight:  BMR: 1958 Previous obesity management: b12 and semaglutide injections thru a weight loss clinic- lost 20+ lbs Body Fat %: 50.1% Starting Meal Plan: Category 3 Meal Plan needs:  Likes/ Dislikes of meal plan: doesn't like some vegetables, can't tolerate all the dairy due to lactose intolerance  Anthropometric Measurements Height: 5\' 11"  (1.803 m) Weight: 293 lb (132.9 kg) BMI (Calculated): 40.88 Weight at Last Visit: 292 lb Weight Lost Since Last Visit: 0 Weight Gained Since Last Visit: 1 Starting Weight: 292 lb Total Weight Loss (lbs): 0 lb (0 kg) Peak Weight: 307 lb  Body Composition  Body Fat %: 50.1 % Fat Mass (lbs): 147.2 lbs Muscle Mass (lbs): 139.2 lbs Total Body Water (lbs): 104.2 lbs Visceral Fat Rating : 15         Relevant Medications   metFORMIN (  GLUCOPHAGE) 500 MG tablet   Vitamin D deficiency   Discussed importance of vitamin d supplementation.  Vitamin d supplementation has been shown to decrease fatigue, decrease risk of progression to insulin resistance and then prediabetes, decreases risk of falling in older age and can even assist in decreasing depressive symptoms in PTSD.   Prescription for Vitamin D sent in.        Relevant Medications   Vitamin D,  Ergocalciferol, (DRISDOL) 1.25 MG (50000 UNIT) CAPS capsule   Other Visit Diagnoses       Obesity (BMI 30-39.9), Starting BMI 39.49       Relevant Medications   metFORMIN (GLUCOPHAGE) 500 MG tablet       No data recorded      08/17/2023    2:19 PM 08/16/2023    3:39 PM 08/11/2023   11:46 AM  Vitals with BMI  Height 5\' 11"     Weight 283 lbs    BMI 39.49    Systolic 104 148 295  Diastolic 71 93 82  Pulse 97 104     No data recorded    ASSESSMENT AND PLAN:  Diet: Shannon [name] is currently in the action stage of change. As such, her goal is to continue with weight loss efforts. She has agreed to Category 3 Plan.  Exercise: Shannon [name] has been instructed that some exercise is better than none for weight loss and overall health benefits.   Behavior Modification:  We discussed the following Behavioral Modification Strategies today: increasing lean protein intake, increasing vegetables, no skipping meals, and meal planning and cooking strategies.   No follow-ups on file.Marland Kitchen She was informed of the importance of frequent follow up visits to maximize her success with intensive lifestyle modifications for her multiple health conditions.  Attestation Statements:   Reviewed by clinician on day of visit: allergies, medications, problem list, medical history, surgical history, family history, social history, and previous encounter notes.   Time spent on visit including pre-visit chart review and post-visit care and charting was 45 minutes.   Reuben Likes, MD

## 2023-08-11 NOTE — Assessment & Plan Note (Addendum)
 Starting weight: 292 Peak weight:  BMR: 1958 Previous obesity management: b12 and semaglutide injections thru a weight loss clinic- lost 20+ lbs Body Fat %: 50.1% Starting Meal Plan: Category 3 Meal Plan needs:  Likes/ Dislikes of meal plan: doesn't like some vegetables, can't tolerate all the dairy due to lactose intolerance  Anthropometric Measurements Height: 5\' 11"  (1.803 m) Weight: 293 lb (132.9 kg) BMI (Calculated): 40.88 Weight at Last Visit: 292 lb Weight Lost Since Last Visit: 0 Weight Gained Since Last Visit: 1 Starting Weight: 292 lb Total Weight Loss (lbs): 0 lb (0 kg) Peak Weight: 307 lb  Body Composition  Body Fat %: 50.1 % Fat Mass (lbs): 147.2 lbs Muscle Mass (lbs): 139.2 lbs Total Body Water (lbs): 104.2 lbs Visceral Fat Rating : 15

## 2023-08-11 NOTE — Assessment & Plan Note (Signed)
 Discussed importance of vitamin d supplementation.  Vitamin d supplementation has been shown to decrease fatigue, decrease risk of progression to insulin resistance and then prediabetes, decreases risk of falling in older age and can even assist in decreasing depressive symptoms in PTSD.   Prescription for Vitamin D sent in.

## 2023-08-11 NOTE — Assessment & Plan Note (Signed)
Pathophysiology of progression through insulin resistance to prediabetes and diabetes was discussed at length today.  Patient to continue to monitor and be in control of total intake of snack calories which may be simple carbohydrates but should be consumed only after the patient has taken in all the nutrition for the day.  Macronutrient identification, classification and daily intake ratios were discussed.  Plan to repeat labs in 3 months to monitor both hemoglobin A1c and insulin levels.  Given patient's other medications and their weight gaining effects will start metformin today.  Side effects, mechanism of action and benefits were discussed today.  Metformin RX sent in.

## 2023-08-11 NOTE — Assessment & Plan Note (Signed)
Blood pressure initially elevated on first reading; second reading better controlled.  No chest pain, chest pressure or headache.  She takes her medication in the am and did take it this am. Will continue current meds and patient will monitor her BP at home and bring log with her at next appointment.  No change in meds.

## 2023-08-16 ENCOUNTER — Ambulatory Visit
Admission: EM | Admit: 2023-08-16 | Discharge: 2023-08-16 | Disposition: A | Discharge status: Home / Self Care | Payer: Medicare HMO | Attending: Family Medicine | Admitting: Family Medicine

## 2023-08-16 DIAGNOSIS — L02419 Cutaneous abscess of limb, unspecified: Secondary | ICD-10-CM

## 2023-08-16 MED ORDER — DOXYCYCLINE HYCLATE 100 MG PO CAPS: 100.0000 mg | ORAL_CAPSULE | Freq: Two times a day (BID) | ORAL | 0 refills | Status: DC

## 2023-08-16 NOTE — Discharge Instructions (Signed)
 Start doxycycline twice daily for 10 days.  Continue warm compresses as this will encourage the abscess to continue to drain.  You may take over-the-counter Tylenol or ibuprofen as needed for pain.  Please follow-up with your PCP in 2 to 3 days for recheck.  Please go to the ER if you develop any worsening symptoms.  Hope you feel better soon!

## 2023-08-16 NOTE — ED Triage Notes (Signed)
 Pt presents to UC for c/o 3 boils under left arm x1 week. Has not drained. Using heat to it

## 2023-08-16 NOTE — ED Provider Notes (Signed)
 [facility]    CSN: [id number] Arrival date & time: 08/16/23  1407      History   Chief Complaint No chief complaint on file.   HPI Shannon [name] is a 53 y.o. female presents for an abscess.  Patient reports 1 week of a boil under her left arm that is worsened.  No fevers chills or drainage.  Denies history of MRSA but states she has had these before.  Denies history of hidradenitis suppurativa.  She has been doing warm compresses without improvement.  No other concerns at this time.  HPI  Past Medical History:  Diagnosis Date   Anemia    Anxiety    Bilateral swelling of feet    Bronchitis    DDD (degenerative disc disease), lumbosacral    Depression    Dilated cardiomyopathy (HCC)    Dyspnea    Sometimes at rest   G6PD deficiency    GERD (gastroesophageal reflux disease)    Hypertension    ILD (interstitial lung disease) (HCC)    Lactose intolerance    Lupus    Osteoarthritis    Palpitations    Pneumonia    Rheumatoid arthritis (HCC)    SLE (systemic lupus erythematosus) (HCC)    Sleep apnea     Patient Active Problem List   Diagnosis Date Noted   Insulin resistance 08/11/2023   Anxiety and depression 07/28/2023   Hyperglycemia 07/28/2023   Vitamin D deficiency 07/28/2023   Hypertension    Chronic pain 03/30/2023   Preoperative clearance 06/10/2022   Lumbar spondylosis 03/19/2022   Chronic low back pain 03/06/2022   Spondylosis without myelopathy 03/06/2022   Upper airway cough syndrome 12/13/2021   Degenerative disc disease at L5-S1 level 09/25/2021   Cervical radiculopathy 06/07/2021   Lumbar radiculopathy 11/22/2020   Pain of left hip joint 08/22/2020   Stiffness of right knee 05/03/2020   Primary osteoarthritis of right knee 04/30/2020   Sleep apnea 04/27/2020   Anemia 09/22/2019   Stiffness of left knee 04/08/2019   OA (osteoarthritis) of knee 04/04/2019   Acute bronchitis 02/13/2017   Cough variant asthma  vs uacs  12/17/2016    Morbid obesity (HCC) 12/17/2016   Contracture of left elbow 11/21/2016   Primary osteoarthritis of both knees 11/21/2016   Pain in right hand 09/09/2016   Pain in left hand 09/09/2016   History of osteoarthritis 09/09/2016   Rheumatoid factor positive 08/01/2016   Laryngopharyngeal reflux (LPR) 08/23/2015   High risk medication use 01/16/2015   Other long term (current) drug therapy 01/16/2015   G6PD deficiency 10/31/2014   Dyspnea 08/23/2014   ILD (interstitial lung disease) (HCC) 08/23/2014   SLE (systemic lupus erythematosus) (HCC) 08/23/2014   Rheumatoid arthritis (HCC) 08/23/2014   Raynaud disease 08/08/2014    Past Surgical History:  Procedure Laterality Date   ABDOMINAL HYSTERECTOMY     APPENDECTOMY  1980   cardiac catherization  08/20/2017   CARDIAC CATHETERIZATION N/A 05/10/2015   Procedure: Right Heart Cath;  Surgeon: Laurey Morale, MD;  Location: [facility] CV LAB;  Service: Cardiovascular;  Laterality: N/A;   CESAREAN SECTION  '95, '02, '07   X 3   CHOLECYSTECTOMY  2010   LAPAROSCOPIC HYSTERECTOMY  10/2015   have ovaries   RIGHT HEART CATH N/A 08/20/2017   Procedure: RIGHT HEART CATH;  Surgeon: Laurey Morale, MD;  Location: [facility] CV LAB;  Service: Cardiovascular;  Laterality: N/A;   SHOULDER ARTHROSCOPY WITH ROTATOR CUFF REPAIR  AND SUBACROMIAL DECOMPRESSION Left 08/13/2022   Procedure: SHOULDER ARTHROSCOPY WITH SUBACROMIAL DECOMPRESSION, DEBRIDEMENT AND PARTIAL ROTATOR CUFF REPAIR;  Surgeon: Jene Every, MD;  Location: [facility]S;  Service: Orthopedics;  Laterality: Left;   SHOULDER ARTHROSCOPY WITH SUBACROMIAL DECOMPRESSION Right 08/28/2021   Procedure: SHOULDER ARTHROSCOPY WITH SUBACROMIAL DECOMPRESSION AND DEBRIDEMENT;  Surgeon: Jene Every, MD;  Location: [facility];  Service: Orthopedics;  Laterality: Right;   SPINAL CORD STIMULATOR INSERTION N/A 03/30/2023   Procedure: SPINAL CORD STIMULATOR INSERTION;  Surgeon: Venita Lick, MD;  Location: [facility] OR;   Service: Orthopedics;  Laterality: N/A;  3 C-Bed   TOTAL KNEE ARTHROPLASTY Left 04/04/2019   Procedure: TOTAL KNEE ARTHROPLASTY;  Surgeon: Ollen Gross, MD;  Location: [facility];  Service: Orthopedics;  Laterality: Left;    TOTAL KNEE ARTHROPLASTY Right 04/30/2020   Procedure: TOTAL KNEE ARTHROPLASTY;  Surgeon: Ollen Gross, MD;  Location: [facility];  Service: Orthopedics;  Laterality: Right;    TUBAL LIGATION      OB History     Gravida  3   Para      Term      Preterm      AB      Living         SAB      IAB      Ectopic      Multiple      Live Births               Home Medications    Prior to Admission medications   Medication Sig Start Date End Date Taking? Authorizing Provider  doxycycline (VIBRAMYCIN) 100 MG capsule Take 1 capsule (100 mg total) by mouth 2 (two) times daily. 08/16/23  Yes Radford Pax, NP  albuterol (VENTOLIN HFA) 108 (90 Base) MCG/ACT inhaler Inhale 2 puffs into the lungs every 6 (six) hours as needed for wheezing or shortness of breath. 11/04/22   Kalman Shan, MD  amitriptyline (ELAVIL) 100 MG tablet Take 100 mg by mouth at bedtime. 05/28/22   [provider]  Budeson-Glycopyrrol-Formoterol (BREZTRI AEROSPHERE) 160-9-4.8 MCG/ACT AERO Inhale 2 puffs into the lungs in the morning and at bedtime. 03/18/23   Glenford Bayley, NP  buPROPion (WELLBUTRIN XL) 300 MG 24 hr tablet Take 300 mg by mouth daily.    [provider]  busPIRone (BUSPAR) 15 MG tablet Take 15 mg by mouth daily.    [provider]  cetirizine (ZYRTEC) 10 MG tablet TAKE 1 TABLET BY MOUTH AT BEDTIME 04/07/23   Glenford Bayley, NP  fluticasone [facility]) 50 MCG/ACT nasal spray Place 1 spray into both nostrils daily. 06/30/23   Radford Pax, NP  hydroxychloroquine (PLAQUENIL) 200 MG tablet Take 400 mg by mouth at bedtime.    [provider]  ibuprofen (ADVIL) 200 MG tablet Take 200 mg by mouth every 6 (six) hours as needed.     [provider]  lisinopril (ZESTRIL) 10 MG tablet Take 10 mg by mouth daily.    [provider]  Magnesium 250 MG CAPS Take by mouth.    [provider]  metFORMIN (GLUCOPHAGE) 500 MG tablet Take 1 tablet (500 mg total) by mouth daily with breakfast. 08/11/23   Langston Reusing, MD  metoprolol succinate (TOPROL-XL) 25 MG 24 hr tablet Take 25 mg by mouth daily. 03/07/23   [provider]  montelukast (SINGULAIR) 10 MG tablet TAKE 1 TABLET BY MOUTH AT BEDTIME 12/08/22   Kalman Shan, MD  mycophenolate (CELLCEPT)  250 MG capsule Take 500 mg by mouth 2 (two) times daily.    [provider]  MYRBETRIQ 25 MG TB24 tablet Take 25 mg by mouth daily.    [provider]  omeprazole (PRILOSEC) 20 MG capsule Take 20 mg by mouth daily.    [provider]  Oxycodone HCl 10 MG TABS Take 10 mg by mouth 4 (four) times daily. 07/23/23   [provider]  QUEtiapine (SEROQUEL XR) 300 MG 24 hr tablet Take 300 mg by mouth at bedtime.    [provider]  sertraline (ZOLOFT) 100 MG tablet Take 200 mg by mouth at bedtime. 02/23/18   [provider]  VEOZAH 45 MG TABS Take 1 tablet by mouth daily. 07/24/23   [provider]  Vitamin D, Cholecalciferol, 10 MCG (400 UNIT) CAPS Take by mouth.    [provider]  Vitamin D, Ergocalciferol, (DRISDOL) 1.25 MG (50000 UNIT) CAPS capsule Take 1 capsule (50,000 Units total) by mouth every 7 (seven) days. 08/11/23   Langston Reusing, MD    Family History Family History  Problem Relation Age of Onset   Sarcoidosis Mother    Lupus Sister    Healthy Daughter    Healthy Son    Healthy Son     Social History Social History   Tobacco Use   Smoking status: Former    Current packs/day: 0.00    Average packs/day: 0.5 packs/day for 14.0 years (7.0 ttl pk-yrs)    Types: Cigarettes    Start date: 09/24/2000    Quit date: 09/25/2014    Years since quitting: 8.8    Smokeless tobacco: Never  Vaping Use   Vaping status: Never Used  Substance Use Topics   Alcohol use: Yes    Alcohol/week: 0.0 standard drinks of alcohol    Comment: occas.   Drug use: No     Allergies   Asa [aspirin], Penicillins, and Sulfa antibiotics   Review of Systems Review of Systems  Skin:        Abscess underarm     Physical Exam Triage Vital Signs ED Triage Vitals  Encounter Vitals Group     BP 08/16/23 1539 (!) 148/93     Systolic BP Percentile --      Diastolic BP Percentile --      Pulse Rate 08/16/23 1539 (!) 104     Resp 08/16/23 1539 18     Temp 08/16/23 1539 98.7 F (37.1 C)     Temp Source 08/16/23 1539 Oral     SpO2 08/16/23 1539 96 %     Weight --      Height --      Head Circumference --      Peak Flow --      Pain Score 08/16/23 1537 9     Pain Loc --      Pain Education --      Exclude from Growth Chart --    No data found.  Updated Vital Signs BP (!) 148/93 (BP Location: Right Arm)   Pulse (!) 104   Temp 98.7 F (37.1 C) (Oral)   Resp 18   LMP 04/28/2015   SpO2 96%   Visual Acuity Right Eye Distance:   Left Eye Distance:   Bilateral Distance:    Right Eye Near:   Left Eye Near:    Bilateral Near:     Physical Exam Vitals and nursing note reviewed.  Constitutional:      General:  She is not in acute distress.    Appearance: Normal appearance. She is obese. She is not ill-appearing.  HENT:     Head: Normocephalic and atraumatic.  Eyes:     Pupils: Pupils are equal, round, and reactive to light.  Cardiovascular:     Rate and Rhythm: Normal rate.  Pulmonary:     Effort: Pulmonary effort is normal.  Skin:    General: Skin is warm and dry.          Comments: There is a 4 x 5 cm indurated abscess to the left axilla.  There is some mild fluctuance to the distal aspect of the abscess.  No active drainage, or erythema.  Area is tender to palpation.  Neurological:     General: No focal deficit present.     Mental Status:  She is alert and oriented to person, place, and time.  Psychiatric:        Mood and Affect: Mood normal.        Behavior: Behavior normal.      UC Treatments / Results  Labs (all labs ordered are listed, but only abnormal results are displayed) Labs Reviewed - No data to display  EKG   Radiology No results found.  Procedures Incision and Drainage  Date/Time: 08/16/2023 4:21 PM  Performed by: Radford Pax, NP Authorized by: Radford Pax, NP   Consent:    Consent obtained:  Verbal   Consent given by:  Patient   Risks, benefits, and alternatives were discussed: yes     Risks discussed:  Bleeding, incomplete drainage, pain, infection and damage to other organs   Alternatives discussed:  Alternative treatment, observation and referral Universal protocol:    Patient identity confirmed:  Verbally with patient Location:    Type:  Abscess   Size:  5 cm   Location:  Upper extremity   Upper extremity location: Left axilla. Pre-procedure details:    Skin preparation:  Chlorhexidine Sedation:    Sedation type:  None Anesthesia:    Anesthesia method:  Local infiltration   Local anesthetic:  Lidocaine 2% WITH epi Procedure type:    Complexity:  Simple Procedure details:    Ultrasound guidance: no     Incision types:  Stab incision   Incision depth:  Subcutaneous   Wound management:  Probed and deloculated   Drainage:  Serosanguinous   Drainage amount:  Moderate   Wound treatment:  Wound left open   Packing materials:  None Post-procedure details:    Procedure completion:  Tolerated well, no immediate complications  (including critical care time)  Medications Ordered in UC Medications - No data to display  Initial Impression / Assessment and Plan / UC Course  I have reviewed the triage vital signs and the nursing notes.  Pertinent labs & imaging results that were available during my care of the patient were reviewed by me and considered in my medical decision  making (see chart for details).  Clinical Course as of 08/16/23 1623  Sun Aug 16, 2023  1623 Heart rate recheck 95 [JM]    Clinical Course User Index [JM] Radford Pax, NP    Patient tolerated I&D well.  Will start doxycycline and advised to continue warm compresses.  She should follow-up with her PCP in 2 to 3 days for recheck.  ER precautions reviewed and patient verbalized understanding. Final Clinical Impressions(s) / UC Diagnoses   Final diagnoses:  Abscess of axilla     Discharge Instructions  Start doxycycline twice daily for 10 days.  Continue warm compresses as this will encourage the abscess to continue to drain.  You may take over-the-counter Tylenol or ibuprofen as needed for pain.  Please follow-up with your PCP in 2 to 3 days for recheck.  Please go to the ER if you develop any worsening symptoms.  Hope you feel better soon!     ED Prescriptions     Medication Sig Dispense Auth. Provider   doxycycline (VIBRAMYCIN) 100 MG capsule Take 1 capsule (100 mg total) by mouth 2 (two) times daily. 20 capsule Radford Pax, NP      PDMP not reviewed this encounter.   Radford Pax, NP 08/16/23 [phone]

## 2023-08-17 ENCOUNTER — Ambulatory Visit (INDEPENDENT_AMBULATORY_CARE_PROVIDER_SITE_OTHER): Payer: Medicare HMO | Admitting: Internal Medicine

## 2023-08-17 ENCOUNTER — Encounter: Payer: Self-pay | Admitting: Internal Medicine

## 2023-08-17 VITALS — BP 104/71 | HR 97 | Temp 98.2°F | Ht 71.0 in | Wt 283.0 lb

## 2023-08-17 DIAGNOSIS — E669 Obesity, unspecified: Secondary | ICD-10-CM | POA: Diagnosis not present

## 2023-08-17 DIAGNOSIS — Z96652 Presence of left artificial knee joint: Secondary | ICD-10-CM | POA: Diagnosis not present

## 2023-08-17 DIAGNOSIS — Z1389 Encounter for screening for other disorder: Secondary | ICD-10-CM | POA: Diagnosis not present

## 2023-08-17 DIAGNOSIS — Z96651 Presence of right artificial knee joint: Secondary | ICD-10-CM | POA: Diagnosis not present

## 2023-08-17 DIAGNOSIS — M47816 Spondylosis without myelopathy or radiculopathy, lumbar region: Secondary | ICD-10-CM | POA: Diagnosis not present

## 2023-08-17 DIAGNOSIS — M75102 Unspecified rotator cuff tear or rupture of left shoulder, not specified as traumatic: Secondary | ICD-10-CM | POA: Diagnosis not present

## 2023-08-17 DIAGNOSIS — Z79891 Long term (current) use of opiate analgesic: Secondary | ICD-10-CM | POA: Diagnosis not present

## 2023-08-17 DIAGNOSIS — J8489 Other specified interstitial pulmonary diseases: Secondary | ICD-10-CM | POA: Diagnosis not present

## 2023-08-17 DIAGNOSIS — M359 Systemic involvement of connective tissue, unspecified: Secondary | ICD-10-CM

## 2023-08-17 DIAGNOSIS — M25561 Pain in right knee: Secondary | ICD-10-CM | POA: Diagnosis not present

## 2023-08-17 DIAGNOSIS — M1711 Unilateral primary osteoarthritis, right knee: Secondary | ICD-10-CM | POA: Diagnosis not present

## 2023-08-17 NOTE — Progress Notes (Signed)
 PCP [name],[name], PA-C Referred by Dr [name]  Shannon [name]   IOV 08/23/2014 His blood work and a CT scan in a few weeks and come back c Chief Complaint  Patient presents with   Pulmonary Consult    Pt referred by Dr. Corliss Skains for ILD. Pt c/o SOB with activity and rest, dry cough and chest tightness also with and without activity.    53 year old female referred for evaluation of autoimmune interstitial lung disease. She presents with her husband. In 2010 while living in [place] she reports she was diagnosed with lupus associated with rheumatoid arthritis in her joints. In 2012 she moved to live in [facility] and several months after that started noticing insidious onset of shortness of breath. Local rheumatologist diagnosed her with interstitial lung disease. She was referred to Dr. Herma Carson at [facility] in [facility] and was started on CellCept/prednisone for autoimmune interstitial lung disease he and however, sometime around 2 years ago she ran out of medical insurance and stopped taking these medications. During this time her dyspnea has progressed. It is currently rated as moderate to severe. It is present on exertion and relieved by rest. Even minimal amount of exertion makes her very ddyspneic. Now she has insurance and she did see Dr. Frederik Pear locally and she has autoimmune panel lab ordered. In addition exam revealed crackles and there for she's been referred here for reevaluation of interstitial lung disease and dyspnea. Dyspnea is associated with some chest tightness but no chest pain. This no associated dizzine   11/24/2014 Follow UP ILD  [name]  She has autoimmune ILD with RA/Lupus  She was seen 6 weeks ago, restarted on Cellcept.  Previously on cellcept but lost her insurance until recently.  On low dose prednisone 5mg  daily .  Last CT chest 4/4 showed ILD changes similar to 2013. Echo was ok with EFG 55-60%, nml PAP . In  March .   Did not take dapsone ,due to  GP6D deficiency.  Labs ok last week with nml LFT , no sign change in hbg. /wbc.  She is feeling better. Does feel her breathing is some better.  No flare of cough or wheezing.     OV 01/16/2015  Chief Complaint  Patient presents with   Follow-up    Pt c/o DOE, mild dry cough, and chest tightness when SOB. Pt states the chest tightness has improved).    follow-up interstitial lung disease in the setting of rheumatoid arthritis Follow-up high risk medication use - on CellCept and prednisone since mid April 2016  - Presents with her husband. Both give a history. Overall she is doing better in terms of dyspnea after starting CellCept and prednisone. However the improvement this only moderate. She still left with a residual moderate dyspnea on exertion that is also made worse with bending or heart air and improved with rest and cool air. It is associated with some cough and wheezing. She takes albuterol inhaler which she feels helps only somewhat but not fully and not quickly enough. She is frustrated by this. In addition she's complaining of some associated right lower back paraspinal spasm for which massage gives her relief. He has never attended pulmonary rehabilitation.  - Lab review shows she has had problems with potassium and has had potassium supplementation. Last lab check was 12/08/2014. She is due for lab test right now   OV 03/28/2015   Chief Complaint  Patient presents with  Follow-up    3 month follow up. Pt states that she is still having some problems with her breathing. Pt c/o of feeling chest tightness, chest pain and cough that is dry. Pt denies wheeze. Pt states that she did trial the Advair and does feel that it helped some.   Follow-up interstitial lung disease secondary to autoimmune process and associated dyspnea that seems out of proportion\  She presents with her husband. She continues on CellCept and prednisone. In the last  visit approximately 2-3 months ago she had out of proportion dyspnea. I gave her some Advair to try. She says this only helped a little bit. Overall she says that dyspnea still persists. It is worse than in the spring 2016 when she started CellCept and prednisone. It is stable since July 2016. It is moderate in intensity. Occurs randomly at rest but also with exertion. Occasionally relieved by rest but also happens at rest. Heat and humidity make it worse. Advair helped a little bit only. This no associated chest tightness or wheezing. She did try and enroll  in pulmonary rehabilitation but could not afford the the startup program and is waiting to hear from them for the maintenance program. She is frustrated by all the symptoms.  Pulmonary function test October 15,016 today FVC 2.55 L/64%, total lung capacity 4 L/65% and diffusion 14.5/42%. Overall no change since April 2016   OV 07/04/2015  Chief Complaint  Patient presents with   Follow-up    pt. states breathing is baseline. SOB. dry cough. wheezing. occ. chest pain/tightness. feels her airway is blocked.   Follow-up interstitial lung disease admitted autoimmune processes and associated out of proportion dyspnea  She presents again with her husband. She continues on CellCept 2 g twice daily associated with prednisone. She cannot take Bactrim or dapsone due to G6PD deficiency. Last seen in the fall of 2016. She was having out of proportion dyspnea. Rated cardia pulmonary stress test and she could not tolerate this test. She then underwent right heart catheterization mid-November 2016 with Dr. Marca Ancona. Review the tests this is normal. Overall stable but she and husband still continued to complain about this resting dyspnea associated with wheezing. They hear noises in the chest. Sometime she gasps for air even at rest. She feels it comes from the chest but the husband points to the throat. I offered second opinion at [facility] because of  this unusual symptoms but they declined citing distance. She was supposed to attend pulmonary rehabilitation but they cannot afford a $60 co-pay twice a week 8 weeks. Offered ENT evaluation that agreeable but they wanted done in [place] which is closer logistically. In addition patient is contemplating now applying for disability. 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  Walking desaturation test 185 feet 3 laps and rheumatic: No desaturation    OV 10/12/2015  Chief Complaint  Patient presents with   Follow-up    PFT today.  breathing is better.  Pt is currently on STD, [employer] approved STD until today, wants to know today if pt is able to return to work, any restrictions.  Pt states that she has been more stable while out of work.      Follow-up interstitial lung disease due to autoimmune processes not otherwise specified  Last seen January 2017. Since then she has been on short-term disability and out of work. She does heavy manual work. The lack of work estimated dyspnea better. She did pulmonary function test today  that I personally visualized and overall it is same. There are no new issues. She is on CellCept and she is tolerating this fine. Last visit she had some anemia we follow this up and the anemia was better. Also hypokalemia resolved by February 2017. She is due for blood work today. 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 This showed up at last visit. She saw an ENT in [place] within referred her to [facility]. She does not want to go to [facility]. She ended up seeing Dr. Ezzard Standing locally. But now she is going to see Dr.  Dina [facility]   Pulmonary function test today 10/12/2015 shows postbronchodilator FVC 2.65 L/67%. FEV1 2.34 L/72% which is up 17% positive bronchodilator response. Ratio of 80/106%. Total lung capacity of 3.8/62%. DLCO 18.36/52%.  Overall consistent with restriction and lung parenchymal disease. Overall PFTs are stable compared to April 2016 but perhaps in DLCO slightly better   OV 04/15/2016  Chief Complaint  Patient presents with   Interstitial Lung Disease    Breathing is unchanged since last OV. Reports SOB, coughing. Cough is non productive. Denies chest tightness or wheezing.   Follow-up interstitial lung disease due to autoimmune processes not otherwise specified. On CellCept and prednisone. Not on Bactrim prophylaxis due to G6PD deficiency  Last visit April 2017. At that time based on pulmonary function tests showed stability and ILD for a year. 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 She did see local ENT doctor [place] and apparently has been reassured. At this point in time she is applying for long-term disability at work but the work discharged from services and she is now applying for Social Security disability. Her dyspnea stable since the interim. She's also had hysterectomy but for the last few weeks her cough is worse and it is dry. This no fever. Is no weight loss or chills. She says she's compliant with her CellCept and prednisone.  OV 05/29/2016  Chief Complaint  Patient presents with   Follow-up    Pt states she still has harsh dry cough, pt states her SOB is unchanged and chest tightness when SOB or when coughing a lot. Pt deneis f/c/s.     Follow-up interstitial lung disease due to autoimmune processes not otherwise specified. On CellCept and prednisone since April 2016. Not on Bactrim prophylaxis due to G6PD deficiency  Helon returns for follow-up. She had high resolution CT chest that shows stability and interstitial lung disease since April 2016. She Pulmicort function test that shows mild improvement since April 2016. Therefore it appears that her CellCept and prednisone is helping her she'll lung disease related to collagen-vascular disease. However she  tells me that she continues to have chronic cough for the last few to several months. It is progressive. It is dry. It is worse than her baseline. The dyspnea is unchanged. This no fever or weight loss or chills   OV 07/24/2016  Chief Complaint  Patient presents with   Follow-up    Pt states she feels the Arnuity has been helping her breathing. Pt c/o dry cough and occ chest tightness.   rec    ICD-9-CM ICD-10-CM   1. ILD (interstitial lung disease) (HCC) 515 J84.9   2. Chronic cough 786.2 R05   3. High risk medication use V58.69 Z79.899    Clinically improved with cellcept/prednisone and arnuity  Blood work ok dec 2017  pLAN - start pulm rehab at [facility] - continue  aruity daily - continue cellcept and prednisone  Followup - 6 months do Pre-bd spiro and dlco only. No lung volume or bd response. No post-bd spiro - 6 months fu Dr Marchelle Gearing after above or sooner if needed     12/15/2016  f/u ov/Wert re: cough dry on arnuity  Chief Complaint  Patient presents with   Follow-up    Breathing is unchanged since her last visit. Pt states she is here to f/u on recent ABG result. She c/o feeling tired all of the time. She has occ cough- non prod.       Finished at Rehab   beginning  Of May  2018 and trouble walking fast or uphill Waking up with ha's since rehab sev days a week  Cough is new x one month mostly dry and day > noct   No obvious day to day or daytime variability or assoc excess/ purulent sputum or mucus plugs or hemoptysis or cp or chest tightness, subjective wheeze or overt sinus or hb symptoms. No unusual exp hx or h/o childhood pna/ asthma or knowledge of premature birth.   OV 02/13/2017  Chief Complaint  Patient presents with   Follow-up    cough/ILD follow up - prod cough with brownish/green mucus with tightness and chest pain x2 days.  denies any f/c/s, hemoptysis.  PFT done today.    Follow-up interstitial lung disease due to autoimmune processes not  otherwise specified. On CellCept and prednisone since April 2016. Not on Bactrim prophylaxis due to G6PD deficiency  53 year old female immunosuppressed with CellCept and prednisone. Not seen her in many months. Currently taking prednisone 3 mg per day and CellCept 1000 mg twice daily. She cannot do Bactrim prophylaxis because of G6PD deficiency. She tells me that overall her health has been stable but in the last few weeks noticing increased shortness of breath in the last few days this increased cough and change in color of sputum to green and increased chest tightness and a feeling that she is getting acute bronchitis. She also was contemplated going to the emergency room a few days ago but now she is better. There is no obvious fever or chills or hemoptysis or edema paroxysmal nocturnal dyspnea or orthopnea. Pulmonary function test today shows 10% decline in FVC and DLCO compared to baseline.   OV 06/08/2017  Chief Complaint  Patient presents with   Follow-up    Feeling about the same as last visit. Still having chest tightness and wheezing at times, Sounds hoarse.     Follow-up interstitial lung disease due to autoimmune processes not otherwise specified. On CellCept and prednisone since April 2016. Not on Bactrim prophylaxis due to G6PD deficiency. Normal Right heart cath Nov 2016.  Last high-resolution CT November 2017  Last visit August 2018.  There is a routine follow-up.  Overall she feels stable.  FVC shows stability since August 2018 but declined since 1 year ago.  DLCO shows continued decline.  Though her lung health is stable.  She says she lost her 1 only biological sister last week due to lupus.  The sister was only 50 and lived in [place].  There are no new issues. Walking desaturation test on 06/08/2017 185 feet x 3 laps on ROOM AIR:  did not desaturate. Rest pulse ox was 100%%, final pulse ox was 100%. HR response 81/min at rest to 109/min at peak exertion. Patient Shannin  A  Blas  Did not Desaturate < 88% . Shannon [name] did nto  Desaturated </= 3% points. DISA RIEDLINGER yes did get tachyardic   OV 10/15/2017   Follow-up interstitial lung disease due to autoimmune processes not otherwise specified. On CellCept and prednisone since April 2016. Not on Bactrim prophylaxis due to G6PD deficiency. Normal Right heart cath Nov 2016 and feb 2019.  Last high-resolution CT November 2017 and dec 2018 without progresion   Last visit December 2018.  This is a routine follow-up.  In the interim she had high-resolution CT scan of the chest that did not show any progression in interstitial lung disease between 2017 and 2018.Marland Kitchen  Therefore we did an echocardiogram that showed slight elevation in pulmonary artery systolic pressure.  Therefore we sent it to her repeat right heart catheterization and this was normal as documented below.  Overall she feels stable compared to one year ago but has declined compared to 2 years ago.  She is also complaining about a new recurrence of cough that happens mostly at night despite Symbicort and prednisone and CellCept.  It wakes her up.  It is moderate in intensity.  There is no associated wheezing or edema orthopnea.  It happens randomly.  There is associated heartburn.  She is on nothing for acid reflux.       Right Heart Pressures 08/20/17 [facility] Procedural Findings: Hemodynamics (mmHg) RA mean 3 RV 30/6 PA 23/8, mean 14 PCWP mean 8  Oxygen saturations: PA 72% AO 98%  Cardiac Output (Fick) 6.73  Cardiac Index (Fick) 3.06  Cardiac Output (Thermo) 6.94 Cardiac Index (Thermo) 3.15    OV 12/01/2017  Chief Complaint  Patient presents with   Follow-up    Pt has SOB with exertion, wheezing, some chest tightness. Pt was dry cough more at bedtime.      Follow-up interstitial lung disease due to autoimmune processes not otherwise specified. On CellCept and prednisone since April 2016. Not on Bactrim prophylaxis due to G6PD deficiency.  Normal Right heart cath Nov 2016 and feb 2019.  Last high-resolution CT November 2017 and dec 2018 without progresion   This visit is to see if she is got progressive interstitial lung disease.  Last visit she was complaining of more cough.  Therefore we added some acid reflux treatment.  She says after the acid reflux treatment symptoms have actually improved.  This contradicts what she told the CMA at intake.  Overall she is feeling stable.  However she does have a new episode of orthostatic dizziness going on at a mild level for the last 1 week.  Today after doing pulmonary function test she did feel dizzy.  Therefore we checked orthostatics on her and she got tachycardic standing up and her blood pressure did drop although still within normal limits.  She does not have any chest pain.  Lung function test shows continued stability in the last 1 year including compared to the most recent attempt.  OV 03/08/2018  Subjective:  Patient ID: Shannon [name], female , DOB: [birth year] , age 89 y.o. , MRN: [id number] , ADDRESS: [address]  [place] [facility] [id number]   03/08/2018 -   Chief Complaint  Patient presents with   Follow-up    Spirometry performed today. Pt states she is still having some mild problems with dizziness but not as bad as last visit. Pt also states she has been having problems with headaches x1 month. Pt states her breathing is about the same  as last visit and also states she still has the dry cough.    Follow-up interstitial lung disease due to autoimmune processes not otherwise specified. On CellCept and prednisone since April 2016. Not on Bactrim prophylaxis due to G6PD deficiency. Normal Right heart cath Nov 2016 and feb 2019.  Last high-resolution CT November 2017 and dec 2018 without progresion     Shannon [name] Shannon [name] 53 y.o. - connective tissue disease ILD. Follow-up. Last seen June 2019 by she started having new onset dizziness. It seemed orthostatic. She says since then it is  spontaneously improved but still persists. She is also dealing with fatigue issues. In 02/26/2018 she felt she had a lupus flare saw Dr. Algis Downs and given a prednisone burst. Autoimmune profile at that time showed continued positive ANA titer but normal complements. She had a CBC that showed a drop in hemoglobin by 1 g percent. Her baseline is around 11.5 g percent and currently it is around 10.5 g percent. She says after the prednisone burst a lupus flare symptoms and fatigue have improved although it still persists. If it is actually worse in the last few months. She had pulmonary function test that shows continued improvement especially following the recent prednisone burst. Walking desaturation test was normal other than tachycardia. We observed her to be walking very slowly in a very deconditioned and fatigued fashion. 02/26/2089 and liver function and renal function reviewed and this was normal. Medication review shows she is on oxycodone and gabapentin for back pain. She says she's been on gabapentin for the last 1 year. It appears that the temporal sequence of fatigue and dizziness might be related to gabapentin but she is not so sure.       Results for Shannon [name], Shannon [name] (MRN [id number]) as of 03/08/2018 10:51  Ref. Range 02/26/2018 10:53  Hemoglobin Latest Ref Range: 11.7 - 15.5 g/dL 40.9 (L)  Results for Shannon [name], Shannon [name] (MRN [id number]) as of 03/08/2018 10:51  Ref. Range 02/26/2018 10:53  Creatinine Latest Ref Range: 0.50 - 1.10 mg/dL 9.56  Results for Shannon [name], Shannon [name] (MRN [id number]) as of 03/08/2018 10:51  Ref. Range 02/26/2018 10:53  AST Latest Ref Range: 10 - 35 U/L 19  ALT Latest Ref Range: 6 - 29 U/L 18   Results for Shannon [name], Shannon [name] (MRN [id number]) as of 03/08/2018 10:51  Ref. Range 02/26/2018 10:53  Anti Nuclear Antibody(ANA) Latest Ref Range: NEGATIVE  POSITIVE (A)  ANA Pattern 1 Unknown SPECKLED (A)  ANA Titer 1 Latest Units: titer > OR = 1:1280 (A)  ds DNA Ab Latest Units: IU/mL <1  C3  Complement Latest Ref Range: 83 - 193 mg/dL 413  C4 Complement Latest Ref Range: 15 - 57 mg/dL 33    ROS - per Shannon [name]    OV 06/03/2018  Subjective:  Patient ID: Shannon [name], female , DOB: 1970/07/20 , age 48 y.o. , MRN: [id number] , ADDRESS: [address]  [place] [facility] [id number]   06/03/2018 -   Chief Complaint  Patient presents with   Follow-up    pt states breathing is baseline. c/o sob with exertion, non prod cough & wheezing    Follow-up interstitial lung disease due to autoimmune processes not otherwise specified. On CellCept and prednisone since April 2016. Not on Bactrim prophylaxis due to G6PD deficiency. Normal Right heart cath Nov 2016 and feb 2019.  Last high-resolution CT November 2017 and dec 2018 without progresion   Shannon [name] Shannon [name] 53 y.o. -presents for follow-up.  Last visit September 2019.  At that time CBC showed anemia.  We repeated the hemoglobin 1 month later and was stable around 10 g%.  She tells me that overall she is stable.  Last visit she had dizziness and I told her to stop gabapentin which she did and her dizziness and fatigue have improved.  She feels she is stable but in the last 3 days she has had a dry cough but no fever or congestion or wheezing or hemoptysis or edema.  No worsening shortness of breath or chest tightness.  Other than that she feels good.  She did up walking desaturation test today and it appears similar to previous visit.       OV 05/13/2019  Subjective:  Patient ID: Shannon [name], female , DOB: March 10, 1971 , age 45 y.o. , MRN: [id number] , ADDRESS: [address] [place] [address]   05/13/2019 -   Chief Complaint  Patient presents with   Follow-up    Pt just had total left knee replacement mid October 2020. Pt said she still has problems with SOB especially with going upstairs. Pt also has occ wheezing but denies any real complaints of cough.   Follow-up interstitial lung disease due to autoimmune processes not  otherwise specified. On CellCept and prednisone since April 2016. Not on Bactrim prophylaxis due to G6PD deficiency. Normal Right heart cath Nov 2016 and feb 2019.  Last high-resolution CT November 2017 and dec 2018 without progression. Last PFT Sept 2019  Shannon [name] Shannon [name] 53 y.o. -returns for follow-up.  Last seen in September 2019 and because of the pandemic things got delayed.  She was supposed to see me in 6 months with spirometry and high-resolution CT chest for progression but this did not happen.  At this follow-up we do not have those data points as yet.  She tells me the interim she has had knee surgery and therefore she is not able to do a simple walk test with [place].  She has gained some weight.  Also in the interim she is now on Social Security disability and she is unemployed now.  She feels her shortness of breath is slightly worse particularly when walking up stairs but otherwise overall she feels stable.  I reviewed her blood work.  And it seems anemia is worse in the 9 g%.  She really denies any bleeding.  Normally she runs between 10 and 11 g%.  I noticed that she still not had a shingles vaccine.   OV 01/09/2020   Subjective:  Patient ID: Shannon [name], female , DOB: 05/19/1971, age 65 y.o. years. , MRN: [id number],  ADDRESS: [address] [place] [address] PCP  Eunice Blase, PA-C Providers : Treatment Team:  Attending Provider: Kalman Shan, MD    Follow-up interstitial lung disease due to autoimmune processes not otherwise specified. On CellCept and prednisone since April 2016. Not on Bactrim prophylaxis due to G6PD deficiency. Normal Right heart cath Nov 2016 and feb 2019.  Last high-resolution CT November 2017 and dec 2018 and dec 2020 without progression. Last PFT Sept 2019 and now June 2021  Chief Complaint  Patient presents with   Follow-up       Shannon [name] Shannon [name] 53 y.o. -returns for follow-up.  Since her last visit she continues to be stable.   She is only on CellCept.  She is also on Plaquenil.  She is not on prednisone or antifibrotic's.  Her dyspnea is  stable.  Her pulmonary function test is improved and a CT chest shows stability.  The main issue is that she says over the last 3 years she has gained 30 to 40 pounds of weight.  She says that she is snoring a lot according to her husband.  Apparently the husband's also noticed apneic spells.  She says she has had the Covid vaccine but is not documented in our chart.  She is yet to have the Shingrix vaccine.       Results for CHIYOKO, TORRICO (MRN [id number]) as of 02/13/2017 12:16  Ref. Range 10/09/2014 09:39 03/28/2015 11:40 10/12/2015 13:42 05/06/2016 10:38 02/13/2017 10:49 06/08/2017  12/01/2017  03/08/2018  11/24/19  FVC-Pre Latest Units: L 2.54 2.55 2.53 2.63 2.32 2.31 2.38 2.46 2.67  FVC-%Pred-Pre Latest Units: % 64 64 64 67 59 59% 61% 67% 73%   Results for ALYZA, ARTIAGA (MRN [id number]) as of 02/13/2017 12:16  Ref. Range 10/09/2014 09:39 03/28/2015 11:40 10/12/2015 13:42 05/06/2016 10:38 02/13/2017 10:49 06/08/2017  12/01/2017  03/08/2018  11/24/19  DLCO unc Latest Units: ml/min/mmHg 15.03 14.50 18.36 18.31 16.35 14.21 16.38 x 18.56  DLCO unc % pred Latest Units: % 43 42 53 53 47 41 47% x 72%    ROS - per Shannon [name] Results for AFOMIA, BLACKLEY (MRN [id number]) as of 01/09/2020 16:33  Ref. Range 12/21/2019 22:12  Creatinine Latest Ref Range: 0.44 - 1.00 mg/dL 3.55  Results for ALVAH, GILDER (MRN [id number]) as of 01/09/2020 16:33  Ref. Range 12/21/2019 22:12  Hemoglobin Latest Ref Range: 12.0 - 15.0 g/dL 70.6 (L)   IMPRESSION: dec 2020 compared to 2018 1. The appearance of the lungs remains compatible with interstitial lung disease, with a spectrum of findings considered indeterminate for usual interstitial pneumonia (UIP) per current ats guidelines. However, given the spectrum of findings and the stability of the findings compared to the prior study, this is once again strongly favored to  represent nonspecific interstitial pneumonia (NSIP).     Electronically Signed   By: Trudie Reed M.D.   On: 06/15/2019 09:42 ROS - per Shannon [name]    OV 07/27/2020  Subjective:  Patient ID: Shannon [name], female , DOB: Aug 02, 1970 , age 21 y.o. , MRN: [id number] , ADDRESS: [address] [place] [address] PCP Eunice Blase, PA-C Patient Care Team: Otila Back as PCP - General (Internal Medicine)  This Provider for this visit: Treatment Team:  Attending Provider: Kalman Shan, MD    07/27/2020 -   Chief Complaint  Patient presents with   Follow-up    Get pft results.   Follow-up interstitial lung disease due to autoimmune processes not otherwise specified. -Lupus per [facility] clinic notes in December 2021.   - On CellCept and prednisone since April 2016.  Off prednisone in 2021  Not on Bactrim prophylaxis due to G6PD deficiency.    - Normal Right heart cath Nov 2016 and feb 2019  -  Last high-resolution CT November 2017 and dec 2018 and dec 2020 without progression.    - Last PFT Sept 2019 and now June 2021 and feb 2022  Chief Complaint  Patient presents with   Follow-up      Shannon [name] Shannon [name] 53 y.o. -returns for routine ILD follow-up.  Last seen in July 2021.  Clinically she is stable.  Since last seeing me November 2020 when she had right knee surgery.  She uses a cane.  She is also seen Dr. Charlann Boxer in sleep  clinic.  She is now on CPAP and it is helping her.  Her weight gain persists.  Her mobility has therefore gone down but from a dyspnea standpoint she is stable.  She is up-to-date with her COVID vaccine.  With the booster she did get symptomatic with Sirs response.  She continues on CellCept.  She is now seeing [facility]ology.  She does not see [facility] anymore.  She believes she has lupus.  She has an appointment upcoming in a few days.  Review of the records at [facility] indicate t that she has lupus.  She continues to  be off prednisone.  She is unable to do a walk test today because of her knee issues and also she is on a cane.  She has pulmonary function test there is a 2-3.8% decline in FVC/DLCO.  This could easily be because of weight gain.  Her symptom scores are stable.  She did have CT angiogram in the summer 2021 this ruled out pulmonary embolism.  There is no comment about ILD.  But then it is a contrast CT.    OV 03/05/2021  Subjective:  Patient ID: Shannon [name], female , DOB: May 25, 1971 , age 81 y.o. , MRN: [id number] , ADDRESS: [address] [place] [address] PCP Eunice Blase, PA-C Patient Care Team: Otila Back as PCP - General (Internal Medicine)  This Provider for this visit: Treatment Team:  Attending Provider: Kalman Shan, MD    03/05/2021 -   Chief Complaint  Patient presents with   Follow-up    PFT performed today.  Pt states she has been doing okay since last visit and states that her breathing is about the same.   Follow-up interstitial lung disease due to autoimmune processes not otherwise specified. -Lupus per [facility] clinic notes in December 2021.   - On CellCept and prednisone since April 2016.  Off prednisone in 2021  Not on Bactrim prophylaxis due to G6PD deficiency.    - Normal Right heart cath Nov 2016 and feb 2019  -  Last high-resolution CT November 2017 and dec 2018 and dec 2020 without progression. - > July 2021 CTA   - Last PFT Sept 2019 and now June 2021 and feb 2022 and sept 2022  Shannon [name] Shannon [name] 53 y.o. -returns for follow-up.  She continues on her immunosuppressive regimen through [facility] rheumatology program.  She is happy with the care there.  She is not on prednisone.  She uses CPAP through Dr. Val Eagle.  Overall she is feeling stable.  Symptom scores are stable.  She had pulmonary function test and it shows that her FVC is stable but DLCO is declined.  This correlates with a hemoglobin around 10 last done in June 2022 at  [facility].  But then she is also chronically anemic.  She is definitely not noticing any worsening in her shortness of breath.  Her walking desaturation test is also stable.  She wants her flu shot today.        PFT  OV 03/18/2022  Subjective:  Patient ID: Shannon [name], female , DOB: 1971-03-02 , age 43 y.o. , MRN: [id number] , ADDRESS: [address] [place] [address] PCP Eunice Blase, PA-C Patient Care Team: Otila Back as PCP - General (Internal Medicine)  This Provider for this visit: Treatment Team:  Attending Provider: Kalman Shan, MD    03/18/2022 -   Chief Complaint  Patient presents with  Follow-up    Pt states she is better compared to last visit and states the cough is better.   Shannon [name] Shannon [name] 53 y.o. -returns for follow-up.  I last saw her 1 year ago.  She continues on CellCept and Plaquenil through [facility].  Shortness of breath is stable.  She had high-resolution CT chest in March 2023 and that is without progression.  She has no other new issues.  She did in the interim tear her rotator cuff on the left side and had surgery.  She is having some sciatic problems.  She will have the flu shot today.  She follows with Dr. Val Eagle for sleep apnea.    CT Chest data HRCT March 2023  IMPRESSION: Basilar and subpleural predominant ground-glass and septal thickening with air trapping, similar to 12/22/2019. Assessment is somewhat limited by respiratory motion and body habitus. Nonspecific interstitial pneumonitis is favored. Findings are indeterminate for UIP per consensus guidelines: Diagnosis of Idiopathic Pulmonary Fibrosis: An Official ATS/ERS/JRS/ALAT Clinical Practice Guideline. Am Rosezetta Schlatter Crit Care Med Vol 198, Iss 5, [phone], Feb 21 2017.     Electronically Signed   By: Leanna Battles M.D.   On: 08/28/2021 11:12    No results found.     OV 10/29/2022  Subjective:  Patient ID: Shannon [name], female , DOB:  10-08-1970 , age 80 y.o. , MRN: [id number] , ADDRESS: [address] [place] [address] PCP Eunice Blase, PA-C Patient Care Team: Otila Back as PCP - General (Internal Medicine)  This Provider for this visit: Treatment Team:  Attending Provider: Kalman Shan, MD 10/29/2022 -   Chief Complaint  Patient presents with   Follow-up    No complaints.  PFT tomorrow.     Shannon [name] Shannon [name] 53 y.o. -returns for follow-up of her ILD due to lupus.  She continues to be stable.  She is supposed a pulmonary function test but she got the visit scheduled today and the pulmonary function schedule tomorrow 10/30/2022.  Nevertheless she feels stable.  She continues on CellCept through [facility] rheumatology.  She has no new issues.  Her main complaint is that she has low backache because of degenerative disc disease in the L4-L5 and L5-S1 areas.  She send procedure is being considered.  I have asked her to consider professional stretching through several, she will stretch facilities that are available.  The commercial facilities available in the local area stretch zone, stretch med and stretch lab.  Asked her to get clearance from her doctor.  She is willing to try this.  I did indicate to her that it could be a out-of-pocket cost.  There is clinical research evidence showing stretching to reduce pain and improve mobility.      OV 08/17/2023  Subjective:  Patient ID: Shannon [name], female , DOB: Nov 09, 1970 , age 70 y.o. , MRN: [id number] , ADDRESS: [address] [place] [address] PCP Eunice Blase, PA-C Patient Care Team: Otila Back as PCP - General (Internal Medicine)  This Provider for this visit: Treatment Team:  Attending Provider: Kalman Shan, MD    Follow-up interstitial lung disease due to autoimmune processes not otherwise specified. -Lupus per [facility] clinic notes in December 2021.   - On CellCept  - s/p Prednisone since April 2016 - Off  prednisone in 2021   - Not on Bactrim prophylaxis due to G6PD deficiency.    - Normal Right heart cath Nov 2016 and feb  2019  -  Last high-resolution CT November 2017 and dec 2018 and dec 2020 without progression. - > July 2021 CTA -. HRCT March 2023 - no change sinc 2021     - Last PFT Nov 2024  08/17/2023 -   Chief Complaint  Patient presents with   Follow-up     Shannon [name] Shannon [name] 53 y.o. -is a routine follow-up.  Last seen in May 2024.  She was then November 2020 for visit.  She says she continues to be stable.  The symptom score suggests continued stability.  The exercise hypoxemia test shows continued stability.  In November 2024 she had pulmonary function testing that shows a decline compared to 2022 but also this.  She is gaining a lot of weight.  In the last 1 year she has lost 17 pounds of weight but overall she is morbidly obese.  Past medical issues include spinal stimulator since October 2024 for her back and also being on metformin for insulin resistance  No change in ongoing immunosuppressive medication.     SYMPTOM SCALE - ILD 05/13/2019  07/27/2020  03/05/2021  03/18/2022  10/29/2022  08/17/2023    O2 use RA ra ra ra ra ra  Shortness of Breath 0 -> 5 scale with 5 being worst (score 6 If unable to do)       At rest 2 1 1 2 3 1   Simple tasks - showers, clothes change, eating, shaving 3 2 2 3 2 2   Household (dishes, doing bed, laundry) 3 3 4 3 3 2   Shopping 3 4 3  Did not ansewr No aswer 3  Walking level at own pace 2 3 3 4 2 3   Walking up Stairs 5 5 3 4 1 3   Total (40 - 48) Dyspnea Score 29 18 16 16 11 14   How bad is your cough? 0 2 0 0 2 2  How bad is your fatigue 3 0 3 2 0 4  nausea  0 0 0 0 0  vomit  0 0 0 0 0  diarrhea  0 0 0 4 0  anxiety  4 4 4 3 3   depression  4  3 z 3          Simple office walk 185 feet x  3 laps goal with forehead probe 03/08/2018  06/03/2018  T.d  10/29/2022  08/17/2023   O2 used Room air Room iar ra ra ra  Number laps  completed 3 3 x 250 feet  Sit/stand test times 10 reps Sit and stand x 15 time  Comments about pace Slow pace normal nl    Resting Pulse Ox/HR 100% and 96/min 100% and 76 100% and 98/min And 98% and heart rate of 101   Final Pulse Ox/HR 100% and 112/min 96% and 121 97% and 135/min 96/98% with heart rate of 115   Desaturated </= 88% n no no no   Desaturated <= 3% points no Yes, 4 points Yes, 3 no   Got Tachycardic >/= 90/min yes yes yes yes   Symptoms at end of test Mild fatigue Mild dyspnea mild Level 6 out of 10 dyspnea   Miscellaneous comments Very slow pace  Avg pace Steady pace      CT Chest data from date: march 2023  IMPRESSION: Basilar and subpleural predominant ground-glass and septal thickening with air trapping, similar to 12/22/2019. Assessment is somewhat limited by respiratory motion and body habitus. Nonspecific interstitial pneumonitis is favored. Findings are indeterminate  for UIP per consensus guidelines: Diagnosis of Idiopathic Pulmonary Fibrosis: An Official ATS/ERS/JRS/ALAT Clinical Practice Guideline. Am Rosezetta Schlatter Crit Care Med Vol 198, Iss 5, [phone], Feb 21 2017.     Electronically Signed   By: Leanna Battles M.D.   On: 08/28/2021 11:12    PFT     Latest Ref Rng & Units 05/08/2023   10:57 AM 03/05/2021    2:58 PM 07/27/2020    8:55 AM 11/24/2019   11:04 AM 03/08/2018   10:35 AM 12/01/2017    2:12 PM 06/08/2017    3:13 PM  PFT Results  FVC-Pre L 2.13  2.64  2.59  2.67  2.46  2.38  2.31   FVC-Predicted Pre % 50  73  72  73  67  61  59   Pre FEV1/FVC % % 86  85  88  83  84  83  81   FEV1-Pre L 1.84  2.24  2.28  2.21  2.07  1.98  1.87   FEV1-Predicted Pre % 54  77  78  75  70  62  59   DLCO uncorrected ml/min/mmHg 14.28  15.34  19.54  18.56   16.38  14.21   DLCO UNC% % 56  60  76  72   47  41   DLCO corrected ml/min/mmHg 14.28  15.34  19.54  20.32    14.89   DLCO COR %Predicted % 56  60  76  79    43   DLVA Predicted % 94  97  117  122   78  78         LAB RESULTS last 96 hours No results found.       has a past medical history of Anemia, Anxiety, Bilateral swelling of feet, Bronchitis, DDD (degenerative disc disease), lumbosacral, Depression, Dilated cardiomyopathy (HCC), Dyspnea, G6PD deficiency, GERD (gastroesophageal reflux disease), Hypertension, ILD (interstitial lung disease) (HCC), Lactose intolerance, Lupus, Osteoarthritis, Palpitations, Pneumonia, Rheumatoid arthritis (HCC), SLE (systemic lupus erythematosus) (HCC), and Sleep apnea.   reports that she quit smoking about 8 years ago. Her smoking use included cigarettes. She started smoking about 22 years ago. She has a 7 pack-year smoking history. She has never used smokeless tobacco.  Past Surgical History:  Procedure Laterality Date   ABDOMINAL HYSTERECTOMY     APPENDECTOMY  1980   cardiac catherization  08/20/2017   CARDIAC CATHETERIZATION N/A 05/10/2015   Procedure: Right Heart Cath;  Surgeon: Laurey Morale, MD;  Location: [facility] INVASIVE CV LAB;  Service: Cardiovascular;  Laterality: N/A;   CESAREAN SECTION  '95, '02, '07   X 3   CHOLECYSTECTOMY  2010   LAPAROSCOPIC HYSTERECTOMY  10/2015   have ovaries   RIGHT HEART CATH N/A 08/20/2017   Procedure: RIGHT HEART CATH;  Surgeon: Laurey Morale, MD;  Location: [facility] INVASIVE CV LAB;  Service: Cardiovascular;  Laterality: N/A;   SHOULDER ARTHROSCOPY WITH ROTATOR CUFF REPAIR AND SUBACROMIAL DECOMPRESSION Left 08/13/2022   Procedure: SHOULDER ARTHROSCOPY WITH SUBACROMIAL DECOMPRESSION, DEBRIDEMENT AND PARTIAL ROTATOR CUFF REPAIR;  Surgeon: Jene Every, MD;  Location: [facility] ORS;  Service: Orthopedics;  Laterality: Left;   SHOULDER ARTHROSCOPY WITH SUBACROMIAL DECOMPRESSION Right 08/28/2021   Procedure: SHOULDER ARTHROSCOPY WITH SUBACROMIAL DECOMPRESSION AND DEBRIDEMENT;  Surgeon: Jene Every, MD;  Location: [facility] ORS;  Service: Orthopedics;  Laterality: Right;   SPINAL CORD STIMULATOR INSERTION N/A 03/30/2023   Procedure:  SPINAL CORD STIMULATOR INSERTION;  Surgeon: Venita Lick, MD;  Location: [facility] OR;  Service: Orthopedics;  Laterality: N/A;  3 C-Bed   TOTAL KNEE ARTHROPLASTY Left 04/04/2019   Procedure: TOTAL KNEE ARTHROPLASTY;  Surgeon: Ollen Gross, MD;  Location: [facility] OR[facility];  Service: Orthopedics;  Laterality: Left;    TOTAL KNEE ARTHROPLASTY Right 04/30/2020   Procedure: TOTAL KNEE ARTHROPLASTY;  Surgeon: Ollen Gross, MD;  Location: [facility];  Service: Orthopedics;  Laterality: Right;    TUBAL LIGATION      Allergies  Allergen Reactions   Asa [Aspirin] Hives and Shortness Of Breath    Chest tightness    Penicillins Hives    Has patient had a PCN reaction causing immediate rash, facial/tongue/throat swelling, SOB or lightheadedness with hypotension: Unknown Has patient had a PCN reaction causing severe rash involving mucus membranes or skin necrosis: Unknown Has patient had a PCN reaction that required hospitalization: Unknown Has patient had a PCN reaction occurring within the last 10 years: No If all of the above answers are "NO", then may proceed with Cephalosporin use.  Tolerated Cephalosporin Date: 05/01/20.     Sulfa Antibiotics     G6PD deficiency     Immunization History  Administered Date(s) Administered   Influenza Split 03/23/2014   Influenza,inj,Quad PF,6+ Mos 03/15/2016, 06/08/2017, 03/08/2018, 02/17/2019, 03/05/2021, 03/18/2022   Influenza-Unspecified 04/03/2020   Moderna Sars-Covid-2 Vaccination 09/20/2019, 10/06/2019   PFIZER Comirnaty(Gray Top)Covid-19 Tri-Sucrose Vaccine 11/05/2019, 05/30/2020   PFIZER(Purple Top)SARS-COV-2 Vaccination 10/06/2019, 11/05/2019   Pfizer Covid-19 Vaccine Bivalent Booster 42yrs & up 04/03/2021   Pneumococcal-Unspecified 08/23/2014    Family History  Problem Relation Age of Onset   Sarcoidosis Mother    Lupus Sister    Healthy Daughter    Healthy Son    Healthy Son      Current Outpatient Medications:    albuterol (VENTOLIN  HFA) 108 (90 Base) MCG/ACT inhaler, Inhale 2 puffs into the lungs every 6 (six) hours as needed for wheezing or shortness of breath., Disp: 1 each, Rfl: 5   amitriptyline (ELAVIL) 100 MG tablet, Take 100 mg by mouth at bedtime., Disp: , Rfl:    Budeson-Glycopyrrol-Formoterol (BREZTRI AEROSPHERE) 160-9-4.8 MCG/ACT AERO, Inhale 2 puffs into the lungs in the morning and at bedtime., Disp: 10.7 g, Rfl: 6   buPROPion (WELLBUTRIN XL) 300 MG 24 hr tablet, Take 300 mg by mouth daily., Disp: , Rfl:    busPIRone (BUSPAR) 15 MG tablet, Take 15 mg by mouth daily., Disp: , Rfl:    cetirizine (ZYRTEC) 10 MG tablet, TAKE 1 TABLET BY MOUTH AT BEDTIME, Disp: 30 tablet, Rfl: 11   doxycycline (VIBRAMYCIN) 100 MG capsule, Take 1 capsule (100 mg total) by mouth 2 (two) times daily., Disp: 20 capsule, Rfl: 0   fluticasone (FLONASE) 50 MCG/ACT nasal spray, Place 1 spray into both nostrils daily., Disp: 15.8 mL, Rfl: 0   hydroxychloroquine (PLAQUENIL) 200 MG tablet, Take 400 mg by mouth at bedtime., Disp: , Rfl:    ibuprofen (ADVIL) 200 MG tablet, Take 200 mg by mouth every 6 (six) hours as needed., Disp: , Rfl:    lisinopril (ZESTRIL) 10 MG tablet, Take 10 mg by mouth daily., Disp: , Rfl:    Magnesium 250 MG CAPS, Take by mouth., Disp: , Rfl:    metFORMIN (GLUCOPHAGE) 500 MG tablet, Take 1 tablet (500 mg total) by mouth daily with breakfast., Disp: 30 tablet, Rfl: 0   metoprolol succinate (TOPROL-XL) 25 MG 24 hr tablet, Take 25 mg by mouth daily., Disp: , Rfl:    montelukast (SINGULAIR) 10 MG tablet, TAKE  1 TABLET BY MOUTH AT BEDTIME, Disp: 30 tablet, Rfl: 11   mycophenolate (CELLCEPT) 250 MG capsule, Take 500 mg by mouth 2 (two) times daily., Disp: , Rfl:    MYRBETRIQ 25 MG TB24 tablet, Take 25 mg by mouth daily., Disp: , Rfl:    omeprazole (PRILOSEC) 20 MG capsule, Take 20 mg by mouth daily., Disp: , Rfl:    Oxycodone HCl 10 MG TABS, Take 10 mg by mouth 4 (four) times daily., Disp: , Rfl:    QUEtiapine (SEROQUEL XR)  300 MG 24 hr tablet, Take 300 mg by mouth at bedtime., Disp: , Rfl:    sertraline (ZOLOFT) 100 MG tablet, Take 200 mg by mouth at bedtime., Disp: , Rfl: 0   VEOZAH 45 MG TABS, Take 1 tablet by mouth daily., Disp: , Rfl:    Vitamin D, Ergocalciferol, (DRISDOL) 1.25 MG (50000 UNIT) CAPS capsule, Take 1 capsule (50,000 Units total) by mouth every 7 (seven) days., Disp: 4 capsule, Rfl: 0      Objective:   Vitals:   08/17/23 1419  BP: 104/71  Pulse: 97  Temp: 98.2 F (36.8 C)  TempSrc: Oral  SpO2: 96%  Weight: 283 lb (128.4 kg)  Height: 5\' 11"  (1.803 m)    Estimated body mass index is 39.47 kg/m as calculated from the following:   Height as of this encounter: 5\' 11"  (1.803 m).   Weight as of this encounter: 283 lb (128.4 kg).  @WEIGHTCHANGE @  [employer]   08/17/23 1419  Weight: 283 lb (128.4 kg)     Physical Exam   General: No distress. Looks stabel O2 at rest: no Cane present: no Sitting in wheel chair: no Frail: no Obese: YES MORBID Neuro: Alert and Oriented x 3. GCS 15. Speech normal Psych: Pleasant Resp:  Barrel Chest - no.  Wheeze - no, Crackles - yes base, No overt respiratory distress CVS: Normal heart sounds. Murmurs - n Ext: Stigmata of Connective Tissue Disease - ono HEENT: Normal upper airway. PEERL +. No post nasal drip        Assessment:       ICD-10-CM   1. Interstitial lung disease due to connective tissue disease (HCC)  J84.89 Pulmonary function test   M35.9 CT Chest High Resolution         Plan:     Patient Instructions  Interstitial lung disease due to connective tissue disease (HCC) High risk medication use Immunosuppressed status  STable CT March 2023 Clinically stable with symptoms and exercise hypoemia test 10/29/2022 and  08/17/2023 However, PFT Nov 2024 might be worse compared to 2022 but this could be due to weight gain in interim OVerall, we need to keep a close eye on fibrosis    Plan ' -Continue CellCept through the  rheumatologist at [facility] -Continue Plaquenil through the rheumatologist at [facility] -Noted that you are off prednisone since 2021  -If pulmonary fibrosis gets worse then we can add nintedanib and consider you for research registry study called ILD-Pro -Get spirometry and DLCO in 3 months (last Nov 2024 ) - get HRCT supine and prine in 3 months (last Mar 2023)   History of snoring Witnessed apneic spells OSA    - glad you are seeing Dr Val Eagle and are on CPAP  Plan  - per Dr Val Eagle   Follow-up -Return to see Dr. Marchelle Gearing 15-minute ILD slot in 3-4 months but after PFT, Cleda Daub and dlco  - symptoms score and walk test in 4 months  FOLLOWUP Return in about 14 weeks (around 11/23/2023) for 15 min visit, after Cleda Daub and DLCO, after HRCT chest, with Dr Marchelle Gearing, ILD.    SIGNATURE    Dr. Kalman Shan, M.D., F.C.C.P,  Pulmonary and Critical Care Medicine Staff Physician, [facility] Center Director - Interstitial Lung Disease  Program  Pulmonary Fibrosis [facility] [place], [place], [address]  Pager: [phone], If no answer or between  15:00h - 7:00h: call [phone] Telephone: [phone]  2:43 PM 08/17/2023

## 2023-08-17 NOTE — Patient Instructions (Addendum)
 Interstitial lung disease due to connective tissue disease (HCC) High risk medication use Immunosuppressed status  STable CT March 2023 Clinically stable with symptoms and exercise hypoemia test 10/29/2022 and  08/17/2023 However, PFT Nov 2024 might be worse compared to 2022 but this could be due to weight gain in interim OVerall, we need to keep a close eye on fibrosis    Plan ' -Continue CellCept through the rheumatologist at [facility] -Continue Plaquenil through the rheumatologist at [facility] -Noted that you are off prednisone since 2021  -If pulmonary fibrosis gets worse then we can add nintedanib and consider you for research registry study called ILD-Pro -Get spirometry and DLCO in 3 months (last Nov 2024 ) - get HRCT supine and prine in 3 months (last Mar 2023)   History of snoring Witnessed apneic spells OSA    - glad you are seeing Shannon [name] and are on CPAP  Plan  - per Shannon [name]   Follow-up -Return to see Shannon. Marchelle [name] 15-minute ILD slot in 3-4 months but after PFT, Cleda Daub and dlco  - symptoms score and walk test in 4 months

## 2023-09-05 DIAGNOSIS — M47816 Spondylosis without myelopathy or radiculopathy, lumbar region: Secondary | ICD-10-CM | POA: Diagnosis not present

## 2023-09-06 ENCOUNTER — Other Ambulatory Visit (INDEPENDENT_AMBULATORY_CARE_PROVIDER_SITE_OTHER): Payer: Self-pay | Admitting: Family Medicine

## 2023-09-06 DIAGNOSIS — E559 Vitamin D deficiency, unspecified: Secondary | ICD-10-CM

## 2023-09-08 ENCOUNTER — Ambulatory Visit (INDEPENDENT_AMBULATORY_CARE_PROVIDER_SITE_OTHER): Payer: Medicare HMO | Admitting: Family Medicine

## 2023-09-08 ENCOUNTER — Other Ambulatory Visit (INDEPENDENT_AMBULATORY_CARE_PROVIDER_SITE_OTHER): Payer: Self-pay | Admitting: Family Medicine

## 2023-09-08 DIAGNOSIS — E88819 Insulin resistance, unspecified: Secondary | ICD-10-CM

## 2023-09-09 ENCOUNTER — Ambulatory Visit (INDEPENDENT_AMBULATORY_CARE_PROVIDER_SITE_OTHER): Admitting: Adult Health

## 2023-09-09 ENCOUNTER — Encounter (INDEPENDENT_AMBULATORY_CARE_PROVIDER_SITE_OTHER): Payer: Self-pay | Admitting: Adult Health

## 2023-09-09 VITALS — BP 138/84 | HR 99 | Temp 98.3°F | Ht 71.0 in | Wt 297.0 lb

## 2023-09-09 DIAGNOSIS — I10 Essential (primary) hypertension: Secondary | ICD-10-CM | POA: Diagnosis not present

## 2023-09-09 DIAGNOSIS — Z6841 Body Mass Index (BMI) 40.0 and over, adult: Secondary | ICD-10-CM

## 2023-09-09 DIAGNOSIS — E669 Obesity, unspecified: Secondary | ICD-10-CM

## 2023-09-09 DIAGNOSIS — E88819 Insulin resistance, unspecified: Secondary | ICD-10-CM

## 2023-09-09 DIAGNOSIS — E559 Vitamin D deficiency, unspecified: Secondary | ICD-10-CM | POA: Diagnosis not present

## 2023-09-09 MED ORDER — VITAMIN D (ERGOCALCIFEROL) 1.25 MG (50000 UNIT) PO CAPS: 50000.0000 [IU] | ORAL_CAPSULE | ORAL | 0 refills | Status: DC

## 2023-09-09 MED ORDER — METFORMIN HCL 500 MG PO TABS: 500.0000 mg | ORAL_TABLET | Freq: Every day | ORAL | 0 refills | Status: DC

## 2023-09-09 NOTE — Progress Notes (Signed)
 WEIGHT SUMMARY AND BIOMETRICS  Vitals Temp: 98.3 F (36.8 C) BP: 138/84 Pulse Rate: 99 SpO2: 99 %   Anthropometric Measurements Height: 5\' 11"  (1.803 m) Weight: 297 lb (134.7 kg) BMI (Calculated): 41.44 Weight at Last Visit: 293 lb Weight Lost Since Last Visit: 0 lb Weight Gained Since Last Visit: 4 lb Starting Weight: 292 lb Total Weight Loss (lbs): 0 lb (0 kg) Peak Weight: 307 lb   Body Composition  Body Fat %: 50.4 % Fat Mass (lbs): 149.8 lbs Muscle Mass (lbs): 140 lbs Total Body Water (lbs): 104.4 lbs Visceral Fat Rating : 15   Other Clinical Data Today's Visit #: 3 Starting Date: 07/28/23    Chief Complaint:   OBESITY Shannon [name] is here to discuss her progress with her obesity treatment plan.  She is on the the Category 3 Plan and states she is following her eating plan approximately 60 % of the time.  She states she is exercising Stationary Bike 40 minutes 7 times per week.   Interim History:  Shannon [name] reports yesterday intake: Coffee with creamer Takes her daughter to school at 0900- [employer] Return home: Breakfast: 3 scrambled eggs, two pieces of [place] sausage with wheat toast (whole wheat toast) Lunch: tuna fish with wheat toast, baked potato chips, cup of juice (apple juice) Snack: 2 Chobani yogurt Dinner: Roasted chicken with roasted asparagus and potatos  She sips on water with Sugar Free Crystal Light all day  She lives with her husband Son age 30 Daughter age 14  Exercise-She is riding her stationary bike 40 mins day She has been breaking this up: AM 10 mins, rest then 10 mins PM 20 mins straight  Subjective:   1. Primary hypertension BP slightly above goal at OV She denies CP when riding her stationary bike She is currently on lisinopril (ZESTRIL) 10 MG tablet  metoprolol succinate (TOPROL-XL) 25 MG 24 hr tablet   2. Vitamin D deficiency  Latest Reference Range & Units 07/30/23 09:37  Vitamin D, 25-Hydroxy 30.0 -  100.0 ng/mL 22.4 (L)  (L): Data is abnormally low  She is weekly Ergocalciferol- denies N/V/Muscle Weakness  3. Insulin resistance  Latest Reference Range & Units 07/30/23 09:37  Glucose 70 - 99 mg/dL 161 (H)  Hemoglobin W9U 4.8 - 5.6 % 5.2  Est. average glucose Bld gHb Est-mCnc mg/dL 045  INSULIN 2.6 - 40.9 uIU/mL 45.8 (H)  (H): Data is abnormally high  She was started on Metformin 500mg  on/about 08/11/2023 The first few weeks she experienced loose stools- now she denies any current GI upset  Assessment/Plan:   1. Primary hypertension (Primary) Limit Na+ Continue regular exercise Continue  lisinopril (ZESTRIL) 10 MG tablet  metoprolol succinate (TOPROL-XL) 25 MG 24 hr tablet   2. Vitamin D deficiency Refill - Vitamin D, Ergocalciferol, (DRISDOL) 1.25 MG (50000 UNIT) CAPS capsule; Take 1 capsule (50,000 Units total) by mouth every 7 (seven) days.  Dispense: 4 capsule; Refill: 0  3. Insulin resistance Refill - metFORMIN (GLUCOPHAGE) 500 MG tablet; Take 1 tablet (500 mg total) by mouth daily with breakfast.  Dispense: 30 tablet; Refill: 0  4. Morbid obesity (HCC), Current BMI 41.5  Shannon [name] is currently in the action stage of change. As such, her goal is to continue with weight loss efforts. She has agreed to the Category 3 Plan.   Discussed at length what foods ARE and ARE NOT on plan.  Exercise goals: All adults should avoid inactivity. Some physical activity is better  than none, and adults who participate in any amount of physical activity gain some health benefits. Adults should also include muscle-strengthening activities that involve all major muscle groups on 2 or more days a week.  Behavioral modification strategies: increasing lean protein intake, decreasing simple carbohydrates, increasing vegetables, increasing water intake, decreasing liquid calories, decreasing sodium intake, increasing high fiber foods, no skipping meals, meal planning and cooking strategies, keeping  healthy foods in the home, and planning for success.  Shannon [name] has agreed to follow-up with our clinic in 2 weeks. She was informed of the importance of frequent follow-up visits to maximize her success with intensive lifestyle modifications for her multiple health conditions.   Objective:   Blood pressure 138/84, pulse 99, temperature 98.3 F (36.8 C), height 5\' 11"  (1.803 m), weight 297 lb (134.7 kg), last menstrual period 04/28/2015, SpO2 99%. Body mass index is 41.42 kg/m.  General: Cooperative, alert, well developed, in no acute distress. HEENT: Conjunctivae and lids unremarkable. Cardiovascular: Regular rhythm.  Lungs: Normal work of breathing. Neurologic: No focal deficits.   Lab Results  Component Value Date   CREATININE 0.89 07/30/2023   BUN 11 07/30/2023   NA 138 07/30/2023   K 4.0 07/30/2023   CL 103 07/30/2023   CO2 20 07/30/2023   Lab Results  Component Value Date   ALT 21 07/30/2023   AST 21 07/30/2023   ALKPHOS 125 (H) 07/30/2023   BILITOT <0.2 07/30/2023   Lab Results  Component Value Date   HGBA1C 5.2 07/30/2023   HGBA1C 4.9 12/19/2015   Lab Results  Component Value Date   INSULIN 45.8 (H) 07/30/2023   Lab Results  Component Value Date   TSH 3.330 07/30/2023   Lab Results  Component Value Date   CHOL 127 08/05/2017   HDL 39 (L) 08/05/2017   LDLCALC 71 08/05/2017   TRIG 86 08/05/2017   CHOLHDL 3.3 08/05/2017   Lab Results  Component Value Date   VD25OH 22.4 (L) 07/30/2023   VD25OH 25 (L) 09/23/2017   VD25OH 26 (L) 11/27/2016   Lab Results  Component Value Date   WBC 5.2 07/30/2023   HGB 10.6 (L) 07/30/2023   HCT 32.2 (L) 07/30/2023   MCV 89 07/30/2023   PLT 230 07/30/2023   Lab Results  Component Value Date   IRON 28 (L) 05/13/2019   Attestation Statements:   Reviewed by clinician on day of visit: allergies, medications, problem list, medical history, surgical history, family history, social history, and previous encounter  notes.  I have reviewed the above documentation for accuracy and completeness, and I agree with the above. -  Severo Beber d. Ajax Schroll, NP-C

## 2023-09-14 DIAGNOSIS — Z96652 Presence of left artificial knee joint: Secondary | ICD-10-CM | POA: Diagnosis not present

## 2023-09-14 DIAGNOSIS — M47816 Spondylosis without myelopathy or radiculopathy, lumbar region: Secondary | ICD-10-CM | POA: Diagnosis not present

## 2023-09-14 DIAGNOSIS — M1711 Unilateral primary osteoarthritis, right knee: Secondary | ICD-10-CM | POA: Diagnosis not present

## 2023-09-14 DIAGNOSIS — Z79891 Long term (current) use of opiate analgesic: Secondary | ICD-10-CM | POA: Diagnosis not present

## 2023-09-14 DIAGNOSIS — Z96651 Presence of right artificial knee joint: Secondary | ICD-10-CM | POA: Diagnosis not present

## 2023-09-14 DIAGNOSIS — G894 Chronic pain syndrome: Secondary | ICD-10-CM | POA: Diagnosis not present

## 2023-09-14 DIAGNOSIS — E669 Obesity, unspecified: Secondary | ICD-10-CM | POA: Diagnosis not present

## 2023-09-14 DIAGNOSIS — M25561 Pain in right knee: Secondary | ICD-10-CM | POA: Diagnosis not present

## 2023-09-14 DIAGNOSIS — M75102 Unspecified rotator cuff tear or rupture of left shoulder, not specified as traumatic: Secondary | ICD-10-CM | POA: Diagnosis not present

## 2023-09-14 DIAGNOSIS — Z1389 Encounter for screening for other disorder: Secondary | ICD-10-CM | POA: Diagnosis not present

## 2023-09-15 DIAGNOSIS — N951 Menopausal and female climacteric states: Secondary | ICD-10-CM | POA: Diagnosis not present

## 2023-09-15 DIAGNOSIS — J849 Interstitial pulmonary disease, unspecified: Secondary | ICD-10-CM | POA: Diagnosis not present

## 2023-09-15 DIAGNOSIS — Z6841 Body Mass Index (BMI) 40.0 and over, adult: Secondary | ICD-10-CM | POA: Diagnosis not present

## 2023-09-15 DIAGNOSIS — Z1211 Encounter for screening for malignant neoplasm of colon: Secondary | ICD-10-CM | POA: Diagnosis not present

## 2023-09-15 DIAGNOSIS — R739 Hyperglycemia, unspecified: Secondary | ICD-10-CM | POA: Diagnosis not present

## 2023-09-15 DIAGNOSIS — I429 Cardiomyopathy, unspecified: Secondary | ICD-10-CM | POA: Diagnosis not present

## 2023-09-15 DIAGNOSIS — Z79899 Other long term (current) drug therapy: Secondary | ICD-10-CM | POA: Diagnosis not present

## 2023-09-30 DIAGNOSIS — I420 Dilated cardiomyopathy: Secondary | ICD-10-CM | POA: Diagnosis not present

## 2023-09-30 DIAGNOSIS — R002 Palpitations: Secondary | ICD-10-CM | POA: Diagnosis not present

## 2023-09-30 DIAGNOSIS — I10 Essential (primary) hypertension: Secondary | ICD-10-CM | POA: Diagnosis not present

## 2023-10-02 ENCOUNTER — Other Ambulatory Visit (INDEPENDENT_AMBULATORY_CARE_PROVIDER_SITE_OTHER): Payer: Self-pay | Admitting: Adult Health

## 2023-10-02 DIAGNOSIS — E559 Vitamin D deficiency, unspecified: Secondary | ICD-10-CM

## 2023-10-04 ENCOUNTER — Other Ambulatory Visit (INDEPENDENT_AMBULATORY_CARE_PROVIDER_SITE_OTHER): Payer: Self-pay | Admitting: Adult Health

## 2023-10-04 DIAGNOSIS — E88819 Insulin resistance, unspecified: Secondary | ICD-10-CM

## 2023-10-06 DIAGNOSIS — M47816 Spondylosis without myelopathy or radiculopathy, lumbar region: Secondary | ICD-10-CM | POA: Diagnosis not present

## 2023-10-08 DIAGNOSIS — M47816 Spondylosis without myelopathy or radiculopathy, lumbar region: Secondary | ICD-10-CM | POA: Diagnosis not present

## 2023-10-08 DIAGNOSIS — E669 Obesity, unspecified: Secondary | ICD-10-CM | POA: Diagnosis not present

## 2023-10-08 DIAGNOSIS — M1711 Unilateral primary osteoarthritis, right knee: Secondary | ICD-10-CM | POA: Diagnosis not present

## 2023-10-08 DIAGNOSIS — Z1389 Encounter for screening for other disorder: Secondary | ICD-10-CM | POA: Diagnosis not present

## 2023-10-08 DIAGNOSIS — M25561 Pain in right knee: Secondary | ICD-10-CM | POA: Diagnosis not present

## 2023-10-08 DIAGNOSIS — Z96651 Presence of right artificial knee joint: Secondary | ICD-10-CM | POA: Diagnosis not present

## 2023-10-08 DIAGNOSIS — M75102 Unspecified rotator cuff tear or rupture of left shoulder, not specified as traumatic: Secondary | ICD-10-CM | POA: Diagnosis not present

## 2023-10-08 DIAGNOSIS — Z96652 Presence of left artificial knee joint: Secondary | ICD-10-CM | POA: Diagnosis not present

## 2023-10-08 DIAGNOSIS — Z79891 Long term (current) use of opiate analgesic: Secondary | ICD-10-CM | POA: Diagnosis not present

## 2023-10-13 ENCOUNTER — Other Ambulatory Visit: Payer: Self-pay | Admitting: Primary Care

## 2023-10-13 DIAGNOSIS — R519 Headache, unspecified: Secondary | ICD-10-CM | POA: Diagnosis not present

## 2023-10-13 DIAGNOSIS — R9389 Abnormal findings on diagnostic imaging of other specified body structures: Secondary | ICD-10-CM | POA: Diagnosis not present

## 2023-10-13 DIAGNOSIS — R4189 Other symptoms and signs involving cognitive functions and awareness: Secondary | ICD-10-CM | POA: Diagnosis not present

## 2023-10-15 ENCOUNTER — Ambulatory Visit (INDEPENDENT_AMBULATORY_CARE_PROVIDER_SITE_OTHER): Admitting: Family Medicine

## 2023-10-21 ENCOUNTER — Encounter (HOSPITAL_BASED_OUTPATIENT_CLINIC_OR_DEPARTMENT_OTHER): Payer: Self-pay | Admitting: Emergency Medicine

## 2023-10-21 ENCOUNTER — Emergency Department (HOSPITAL_BASED_OUTPATIENT_CLINIC_OR_DEPARTMENT_OTHER)

## 2023-10-21 ENCOUNTER — Emergency Department (HOSPITAL_BASED_OUTPATIENT_CLINIC_OR_DEPARTMENT_OTHER)
Admission: EM | Admit: 2023-10-21 | Discharge: 2023-10-22 | Disposition: A | Discharge status: Home / Self Care | Attending: Emergency Medicine | Admitting: Emergency Medicine

## 2023-10-21 ENCOUNTER — Other Ambulatory Visit: Payer: Self-pay

## 2023-10-21 DIAGNOSIS — R0602 Shortness of breath: Secondary | ICD-10-CM | POA: Insufficient documentation

## 2023-10-21 DIAGNOSIS — Z79899 Other long term (current) drug therapy: Secondary | ICD-10-CM | POA: Insufficient documentation

## 2023-10-21 DIAGNOSIS — R000 Tachycardia, unspecified: Secondary | ICD-10-CM | POA: Insufficient documentation

## 2023-10-21 DIAGNOSIS — R918 Other nonspecific abnormal finding of lung field: Secondary | ICD-10-CM | POA: Diagnosis not present

## 2023-10-21 DIAGNOSIS — M79662 Pain in left lower leg: Secondary | ICD-10-CM | POA: Diagnosis not present

## 2023-10-21 DIAGNOSIS — R0789 Other chest pain: Secondary | ICD-10-CM | POA: Diagnosis not present

## 2023-10-21 DIAGNOSIS — M79661 Pain in right lower leg: Secondary | ICD-10-CM | POA: Insufficient documentation

## 2023-10-21 DIAGNOSIS — I517 Cardiomegaly: Secondary | ICD-10-CM | POA: Diagnosis not present

## 2023-10-21 DIAGNOSIS — R079 Chest pain, unspecified: Secondary | ICD-10-CM | POA: Diagnosis not present

## 2023-10-21 DIAGNOSIS — R590 Localized enlarged lymph nodes: Secondary | ICD-10-CM | POA: Diagnosis not present

## 2023-10-21 DIAGNOSIS — I10 Essential (primary) hypertension: Secondary | ICD-10-CM | POA: Insufficient documentation

## 2023-10-21 DIAGNOSIS — R519 Headache, unspecified: Secondary | ICD-10-CM | POA: Diagnosis not present

## 2023-10-21 DIAGNOSIS — R600 Localized edema: Secondary | ICD-10-CM | POA: Insufficient documentation

## 2023-10-21 LAB — CBC
HCT: 32 % — ABNORMAL LOW (ref 36.0–46.0)
Hemoglobin: 10.7 g/dL — ABNORMAL LOW (ref 12.0–15.0)
MCH: 29.6 pg (ref 26.0–34.0)
MCHC: 33.4 g/dL (ref 30.0–36.0)
MCV: 88.4 fL (ref 80.0–100.0)
Platelets: 236 10*3/uL (ref 150–400)
RBC: 3.62 MIL/uL — ABNORMAL LOW (ref 3.87–5.11)
RDW: 13.2 % (ref 11.5–15.5)
WBC: 5.8 10*3/uL (ref 4.0–10.5)
nRBC: 0 % (ref 0.0–0.2)

## 2023-10-21 LAB — BASIC METABOLIC PANEL WITH GFR
Anion gap: 13 (ref 5–15)
BUN: 10 mg/dL (ref 6–20)
CO2: 24 mmol/L (ref 22–32)
Calcium: 9.5 mg/dL (ref 8.9–10.3)
Chloride: 101 mmol/L (ref 98–111)
Creatinine, Ser: 0.9 mg/dL (ref 0.44–1.00)
GFR, Estimated: >60 mL/min (ref >60)
Glucose, Bld: 88 mg/dL (ref 70–99)
Potassium: 3.7 mmol/L (ref 3.5–5.1)
Sodium: 138 mmol/L (ref 135–145)

## 2023-10-21 LAB — TROPONIN T, HIGH SENSITIVITY: Troponin T High Sensitivity: <15 ng/L (ref <19)

## 2023-10-21 LAB — D-DIMER, QUANTITATIVE: D-Dimer, Quant: 1.77 ug{FEU}/mL — ABNORMAL HIGH (ref 0.00–0.50)

## 2023-10-21 MED ORDER — IOHEXOL 350 MG/ML SOLN
100.0000 mL | Freq: Once | INTRAVENOUS | Status: AC | PRN
  Administered 2023-10-21: 100 mL via INTRAVENOUS

## 2023-10-21 MED ORDER — ACETAMINOPHEN 500 MG PO TABS
1000.0000 mg | ORAL_TABLET | Freq: Once | ORAL | Status: AC
Start: 2023-10-21 — End: 2023-10-21
  Administered 2023-10-21: 1000 mg via ORAL
  Filled 2023-10-21: qty 2

## 2023-10-21 NOTE — ED Provider Notes (Incomplete)
 [place] [facility] AT [facility] [place] Provider Note   CSN: [id number] Arrival date & time: 10/21/23  1936     History {Add pertinent medical, surgical, social history, OB history to HPI:1} Chief Complaint  Patient presents with  . Chest Pain    Shannon [name] is a 53 y.o. female who presents to the ED today after having sharp chest pain for the past two days. Patient states that the pain began 2 days ago and was episodic in nature, but that today her pain has become constant and that she had to come in. Patient has a significant PMHx including but not limited to RA, SLE, anemia, dilated cardiomyopathy, G6PD deficiency, and interstitial lung disease. Patient reports that she is also experiencing some shortness of breath but that this is not that abnormal from baseline. She does however state that both of her feet/ankles are swollen and that this is not normal for her. States she has also been experiencing some headaches.    Chest Pain Associated symptoms: headache and shortness of breath   Associated symptoms: no abdominal pain, no cough, no dizziness, no fever, no nausea, no vomiting and no weakness        Home Medications Prior to Admission medications   Medication Sig Start Date End Date Taking? Authorizing Provider  albuterol  (VENTOLIN  HFA) 108 (90 Base) MCG/ACT inhaler Inhale 2 puffs into the lungs every 6 (six) hours as needed for wheezing or shortness of breath. 11/04/22   Maire Scot, MD  amitriptyline  (ELAVIL ) 100 MG tablet Take 100 mg by mouth at bedtime. 05/28/22   [provider]  BREZTRI  AEROSPHERE 160-9-4.8 MCG/ACT AERO inhaler INHALE 2 PUFFS INTO LUNGS IN THE MORNING AND AT BEDTIME 10/13/23   Antonio Baumgarten, NP  buPROPion  (WELLBUTRIN  XL) 300 MG 24 hr tablet Take 300 mg by mouth daily.    [provider]  busPIRone  (BUSPAR ) 15 MG tablet Take 15 mg by mouth daily.    [provider]  cetirizine  (ZYRTEC ) 10 MG tablet  TAKE 1 TABLET BY MOUTH AT BEDTIME 04/07/23   Antonio Baumgarten, NP  fluticasone  (FLONASE ) 50 MCG/ACT nasal spray Place 1 spray into both nostrils daily. 06/30/23   Mayer, Jodi R, NP  hydroxychloroquine  (PLAQUENIL ) 200 MG tablet Take 400 mg by mouth at bedtime.    [provider]  ibuprofen (ADVIL) 200 MG tablet Take 200 mg by mouth every 6 (six) hours as needed.    [provider]  lisinopril  (ZESTRIL ) 10 MG tablet Take 10 mg by mouth daily.    [provider]  Magnesium  250 MG CAPS Take by mouth.    [provider]  metFORMIN  (GLUCOPHAGE ) 500 MG tablet Take 1 tablet (500 mg total) by mouth daily with breakfast. 09/09/23   Danford, Acie Holiday D, NP  metoprolol  succinate (TOPROL -XL) 25 MG 24 hr tablet Take 25 mg by mouth daily. 03/07/23   [provider]  montelukast  (SINGULAIR ) 10 MG tablet TAKE 1 TABLET BY MOUTH AT BEDTIME 12/08/22   Maire Scot, MD  mycophenolate  (CELLCEPT ) 250 MG capsule Take 500 mg by mouth 2 (two) times daily.    [provider]  MYRBETRIQ  25 MG TB24 tablet Take 25 mg by mouth daily.    [provider]  omeprazole (PRILOSEC) 20 MG capsule Take 20 mg by mouth daily.    [provider]  Oxycodone  HCl 10 MG TABS Take 10 mg by mouth 4 (four) times daily. 07/23/23   [provider]  QUEtiapine  (SEROQUEL  XR) 300 MG 24 hr tablet Take 300 mg by mouth at bedtime.    [provider]  sertraline  (ZOLOFT ) 100 MG tablet Take 200 mg by mouth at bedtime. 02/23/18   [provider]  VEOZAH 45 MG TABS Take 1 tablet by mouth daily. 07/24/23   [provider]  Vitamin D , Ergocalciferol , (DRISDOL ) 1.25 MG (50000 UNIT) CAPS capsule Take 1 capsule (50,000 Units total) by mouth every 7 (seven) days. 09/09/23   Danford, Barkley Li, NP      Allergies    Asa [aspirin ], Penicillins, and Sulfa antibiotics    Review of Systems   Review of Systems  Constitutional:  Negative for chills and fever.   Respiratory:  Positive for shortness of breath. Negative for cough, wheezing and stridor.   Cardiovascular:  Positive for chest pain and leg swelling.  Gastrointestinal:  Negative for abdominal pain, constipation, diarrhea, nausea and vomiting.  Skin:  Negative for rash and wound.  Neurological:  Positive for headaches. Negative for dizziness, syncope and weakness.    Physical Exam Updated Vital Signs BP (!) 162/105 (BP Location: Right Arm)   Pulse (!) 107   Temp 98.9 F (37.2 C) (Oral)   Resp 17   LMP 04/28/2015   SpO2 100%  Physical Exam Vitals and nursing note reviewed.  Constitutional:      General: She is awake. She is not in acute distress.    Appearance: She is well-developed. She is obese. She is not ill-appearing, toxic-appearing or diaphoretic.  HENT:     Head: Normocephalic and atraumatic.  Cardiovascular:     Rate and Rhythm: Normal rate and regular rhythm.     Heart sounds: Normal heart sounds. Heart sounds not distant. No murmur heard. Pulmonary:     Effort: Pulmonary effort is normal. No tachypnea or respiratory distress.     Breath sounds: Normal breath sounds.  Chest:     Chest wall: Tenderness (sternal and left-sided chest tenderness reproducible with palpation) present.  Abdominal:     Palpations: Abdomen is soft.     Tenderness: There is no abdominal tenderness. There is no guarding.  Musculoskeletal:     Right lower leg: Tenderness present. Edema present.     Left lower leg: Tenderness present. Edema present.  Skin:    General: Skin is warm and dry.     Capillary Refill: Capillary refill takes less than 2 seconds.  Neurological:     General: No focal deficit present.     Mental Status: She is alert and oriented to person, place, and time.  Psychiatric:        Mood and Affect: Mood normal.        Behavior: Behavior normal. Behavior is cooperative.     ED Results / Procedures / Treatments   Labs (all labs ordered are listed, but only abnormal  results are displayed) Labs Reviewed  BASIC METABOLIC PANEL WITH GFR  CBC  TROPONIN T, HIGH SENSITIVITY    EKG EKG Interpretation Date/Time:  Wednesday October 21 2023 19:44:47 EDT Ventricular Rate:  108 PR Interval:  139 QRS Duration:  112 QT Interval:  382 QTC Calculation: 513 R Axis:   19  Text Interpretation: Sinus tachycardia Consider right atrial enlargement Incomplete right bundle branch block new Prolonged QT interval Confirmed by [employer] ([id number]) on 10/21/2023 8:10:20 PM  Radiology DG Chest 2 View Result Date: 10/21/2023 CLINICAL DATA:  Chest pain EXAM: CHEST - 2 VIEW COMPARISON:  08/18/2022 FINDINGS: Stable cardiomegaly. Low  lung volumes accentuate pulmonary vascularity. Interstitial coarsening may be due to low lung volumes or edema. Retrocardiac atelectasis or infiltrates. No pleural effusion or pneumothorax. IMPRESSION: 1. Interstitial coarsening may be due to hypoventilation or mild edema. 2. Retrocardiac atelectasis or pneumonia. Electronically Signed   By: Rozell Cornet M.D.   On: 10/21/2023 20:28    Procedures Procedures  {Document cardiac monitor, telemetry assessment procedure when appropriate:1}  Medications Ordered in ED Medications - No data to display  ED Course/ Medical Decision Making/ A&P    Patient presents to the ED for concern of chest pain, this involves an extensive number of treatment options, and is a complaint that carries with it a high risk of complications and morbidity.  The differential diagnosis of chest pain includes: Acute coronary syndrome, pericarditis, aortic dissection, pulmonary embolism, tension pneumothorax, pneumonia, and esophageal rupture.    Co morbidities that complicate the patient evaluation  Significant past medical history including lupus, interstitial lung disease, RA, anemia, HTN   Lab Tests:  I Ordered, and personally interpreted labs.  The pertinent results include:  troponin < 15. D-Dimer  1.77   Imaging Studies ordered:  I ordered imaging studies including CXR, CTA  I independently visualized and interpreted imaging which showed no evidence of significant pulmonary embolus, chronic interstitial fibrosis with honeycomb changes in the bases I agree with the radiologist interpretation  Well's criteria score indicates high risk group for PE in ED population.    Cardiac Monitoring:  The patient was maintained on a cardiac monitor.  I personally viewed and interpreted the cardiac monitored which showed an underlying rhythm of: sinus   Medicines ordered and prescription drug management:  I ordered medication including tylenol  for headache/pain  Reevaluation of the patient after these medicines showed that the patient improved I have reviewed the patients home medicines and have made adjustments as needed   Test Considered:  CT head: patient does have a headache but relieved with tylenol , declined due to no current indication including trauma, stroke symptoms, seizures, syncope, etc.    Problem List / ED Course:  Chest pain    Reevaluation:  After the interventions noted above, I reevaluated the patient and found that they have :{resolved/improved/worsened:23923::"improved"}   Social Determinants of Health:  None    Dispostion:  After consideration of the diagnostic results and the patients response to treatment, I feel that the patent would benefit from ***.   Click here for ABCD2, HEART and other calculatorsREFRESH Note before signing :1}                              Medical Decision Making Amount and/or Complexity of Data Reviewed Labs: ordered. Radiology: ordered.  Risk OTC drugs. Prescription drug management.    {Document critical care time when appropriate:1} {Document review of labs and clinical decision tools ie heart score, Chads2Vasc2 etc:1}  {Document your independent review of radiology images, and any outside records:1} {Document  your discussion with family members, caretakers, and with consultants:1} {Document social determinants of health affecting pt's care:1} {Document your decision making why or why not admission, treatments were needed:1} Final Clinical Impression(s) / ED Diagnoses Final diagnoses:  None    Rx / DC Orders ED Discharge Orders     None

## 2023-10-21 NOTE — ED Notes (Signed)
 Report given to [facility]

## 2023-10-21 NOTE — ED Triage Notes (Signed)
 Pt with sharp left sided chest pain x 2 days that has increased in frequency and is now constant.  Pt reports headache as well and bilateral ankle swelling.  Does have hypertension and reports compliance with meds.

## 2023-10-21 NOTE — ED Provider Notes (Signed)
 [place] [facility] Provider Note   CSN: [id number] Arrival date & time: 10/21/23  1936     History  Chief Complaint  Patient presents with   Chest Pain    Shannon [name] is a 53 y.o. female who presents to the ED today after having sharp chest pain for the past two days. Patient states that the pain began 2 days ago and was episodic in nature, but that today her pain has become constant and that she had to come in. Patient has a significant PMHx including but not limited to RA, SLE, anemia, dilated cardiomyopathy, G6PD deficiency, and interstitial lung disease. Patient reports that she is also experiencing some shortness of breath but that this is not that abnormal from baseline. She does however state that both of her feet/ankles are swollen and that this is not normal for her. States she has also been experiencing some headaches.    Chest Pain Associated symptoms: headache and shortness of breath   Associated symptoms: no abdominal pain, no cough, no dizziness, no fever, no nausea, no vomiting and no weakness        Home Medications Prior to Admission medications   Medication Sig Start Date End Date Taking? Authorizing Provider  albuterol  (VENTOLIN  HFA) 108 (90 Base) MCG/ACT inhaler Inhale 2 puffs into the lungs every 6 (six) hours as needed for wheezing or shortness of breath. 11/04/22   Maire Scot, MD  amitriptyline  (ELAVIL ) 100 MG tablet Take 100 mg by mouth at bedtime. 05/28/22   [provider]  BREZTRI  AEROSPHERE 160-9-4.8 MCG/ACT AERO inhaler INHALE 2 PUFFS INTO LUNGS IN THE MORNING AND AT BEDTIME 10/13/23   Antonio Baumgarten, NP  buPROPion  (WELLBUTRIN  XL) 300 MG 24 hr tablet Take 300 mg by mouth daily.    [provider]  busPIRone  (BUSPAR ) 15 MG tablet Take 15 mg by mouth daily.    [provider]  cetirizine  (ZYRTEC ) 10 MG tablet TAKE 1 TABLET BY MOUTH AT BEDTIME 04/07/23   Antonio Baumgarten, NP   fluticasone  (FLONASE ) 50 MCG/ACT nasal spray Place 1 spray into both nostrils daily. 06/30/23   Mayer, Jodi R, NP  hydroxychloroquine  (PLAQUENIL ) 200 MG tablet Take 400 mg by mouth at bedtime.    [provider]  ibuprofen (ADVIL) 200 MG tablet Take 200 mg by mouth every 6 (six) hours as needed.    [provider]  lisinopril  (ZESTRIL ) 10 MG tablet Take 10 mg by mouth daily.    [provider]  Magnesium  250 MG CAPS Take by mouth.    [provider]  metFORMIN  (GLUCOPHAGE ) 500 MG tablet Take 1 tablet (500 mg total) by mouth daily with breakfast. 09/09/23   Danford, Acie Holiday D, NP  metoprolol  succinate (TOPROL -XL) 25 MG 24 hr tablet Take 25 mg by mouth daily. 03/07/23   [provider]  montelukast  (SINGULAIR ) 10 MG tablet TAKE 1 TABLET BY MOUTH AT BEDTIME 12/08/22   Maire Scot, MD  mycophenolate  (CELLCEPT ) 250 MG capsule Take 500 mg by mouth 2 (two) times daily.    [provider]  MYRBETRIQ  25 MG TB24 tablet Take 25 mg by mouth daily.    [provider]  omeprazole (PRILOSEC) 20 MG capsule Take 20 mg by mouth daily.    [provider]  Oxycodone  HCl 10 MG TABS Take 10 mg by mouth 4 (four) times daily. 07/23/23   [provider]  QUEtiapine  (SEROQUEL  XR) 300 MG 24 hr tablet  Take 300 mg by mouth at bedtime.    [provider]  sertraline  (ZOLOFT ) 100 MG tablet Take 200 mg by mouth at bedtime. 02/23/18   [provider]  VEOZAH 45 MG TABS Take 1 tablet by mouth daily. 07/24/23   [provider]  Vitamin D , Ergocalciferol , (DRISDOL ) 1.25 MG (50000 UNIT) CAPS capsule Take 1 capsule (50,000 Units total) by mouth every 7 (seven) days. 09/09/23   Danford, Barkley Li, NP      Allergies    Asa [aspirin ], Penicillins, and Sulfa antibiotics    Review of Systems   Review of Systems  Constitutional:  Negative for chills and fever.  Respiratory:  Positive for shortness of breath. Negative for cough, wheezing  and stridor.   Cardiovascular:  Positive for chest pain and leg swelling.  Gastrointestinal:  Negative for abdominal pain, constipation, diarrhea, nausea and vomiting.  Skin:  Negative for rash and wound.  Neurological:  Positive for headaches. Negative for dizziness, syncope and weakness.    Physical Exam Updated Vital Signs BP (!) 126/113   Pulse 94   Temp 98.9 F (37.2 C) (Oral)   Resp (!) 22   LMP 04/28/2015   SpO2 96%  Physical Exam Vitals and nursing note reviewed.  Constitutional:      General: She is awake. She is not in acute distress.    Appearance: She is well-developed. She is obese. She is not ill-appearing, toxic-appearing or diaphoretic.  HENT:     Head: Normocephalic and atraumatic.  Cardiovascular:     Rate and Rhythm: Normal rate and regular rhythm.     Heart sounds: Normal heart sounds. Heart sounds not distant. No murmur heard. Pulmonary:     Effort: Pulmonary effort is normal. No tachypnea or respiratory distress.     Breath sounds: Normal breath sounds.  Chest:     Chest wall: Tenderness (sternal and left-sided chest tenderness reproducible with palpation) present.  Abdominal:     Palpations: Abdomen is soft.     Tenderness: There is no abdominal tenderness. There is no guarding.  Musculoskeletal:     Right lower leg: Tenderness present. Edema present.     Left lower leg: Tenderness present. Edema present.  Skin:    General: Skin is warm and dry.     Capillary Refill: Capillary refill takes less than 2 seconds.  Neurological:     General: No focal deficit present.     Mental Status: She is alert and oriented to person, place, and time.  Psychiatric:        Mood and Affect: Mood normal.        Behavior: Behavior normal. Behavior is cooperative.     ED Results / Procedures / Treatments   Labs (all labs ordered are listed, but only abnormal results are displayed) Labs Reviewed  CBC - Abnormal; Notable for the following components:      Result  Value   RBC 3.62 (*)    Hemoglobin 10.7 (*)    HCT 32.0 (*)    All other components within normal limits  D-DIMER, QUANTITATIVE - Abnormal; Notable for the following components:   D-Dimer, Quant 1.77 (*)    All other components within normal limits  BASIC METABOLIC PANEL WITH GFR  PRO BRAIN NATRIURETIC PEPTIDE  TROPONIN T, HIGH SENSITIVITY  TROPONIN T, HIGH SENSITIVITY    EKG EKG Interpretation Date/Time:  Wednesday October 21 2023 19:44:47 EDT Ventricular Rate:  108 PR Interval:  139 QRS Duration:  112 QT Interval:  382 QTC Calculation: 513 R Axis:   19  Text Interpretation: Sinus tachycardia Consider right atrial enlargement Incomplete right bundle branch block new Prolonged QT interval Confirmed by Almond Army ([id number]) on 10/21/2023 8:10:20 PM  Radiology CT Angio Chest PE W and/or Wo Contrast Result Date: 10/21/2023 CLINICAL DATA:  Pulmonary embolus suspected with high probability. Chest pain. Left-sided chest pain for 2 days, increasing. Bilateral ankle swelling. EXAM: CT ANGIOGRAPHY CHEST WITH CONTRAST TECHNIQUE: Multidetector CT imaging of the chest was performed using the standard protocol during bolus administration of intravenous contrast. Multiplanar CT image reconstructions and MIPs were obtained to evaluate the vascular anatomy. RADIATION DOSE REDUCTION: This exam was performed according to the departmental dose-optimization program which includes automated exposure control, adjustment of the mA and/or kV according to patient size and/or use of iterative reconstruction technique. CONTRAST:  OMNIPAQUE  IOHEXOL  350 MG/ML SOLN COMPARISON:  Chest radiograph 10/21/2023.  CT chest 08/27/2021 FINDINGS: Cardiovascular: Technically adequate study with good opacification of the central and segmental pulmonary arteries. Mild motion artifact. No focal filling defects. No evidence of significant pulmonary embolus. Normal heart size. No pericardial effusions. Normal caliber thoracic  aorta. No aortic dissection. Mediastinum/Nodes: Esophagus is decompressed. Thyroid  gland is unremarkable. No significant mediastinal or hilar lymphadenopathy. Prominent axillary lymph nodes, greater on the right, measuring up to 1.8 cm short axis dimension. These are unchanged since prior study, nonspecific but likely to be reactive. Lungs/Pleura: Motion artifact limits examination. Interstitial changes in the lungs with basilar honeycomb changes consistent with chronic fibrosis or usual interstitial pneumonitis. No consolidation, effusion, or pneumothorax. Upper Abdomen: No acute abnormalities. Musculoskeletal: Spinal stimulator leads in the midthoracic region. Mild degenerative changes in the spine. No acute bony abnormalities. Review of the MIP images confirms the above findings. IMPRESSION: 1. No evidence of significant pulmonary embolus. 2. Chronic interstitial fibrosis with honeycomb changes in the bases. Electronically Signed   By: Boyce Byes M.D.   On: 10/21/2023 23:09   DG Chest 2 View Result Date: 10/21/2023 CLINICAL DATA:  Chest pain EXAM: CHEST - 2 VIEW COMPARISON:  08/18/2022 FINDINGS: Stable cardiomegaly. Low lung volumes accentuate pulmonary vascularity. Interstitial coarsening may be due to low lung volumes or edema. Retrocardiac atelectasis or infiltrates. No pleural effusion or pneumothorax. IMPRESSION: 1. Interstitial coarsening may be due to hypoventilation or mild edema. 2. Retrocardiac atelectasis or pneumonia. Electronically Signed   By: Rozell Cornet M.D.   On: 10/21/2023 20:28    Procedures Procedures    Medications Ordered in ED Medications  acetaminophen  (TYLENOL ) tablet 1,000 mg (1,000 mg Oral Given 10/21/23 2203)  iohexol  (OMNIPAQUE ) 350 MG/ML injection 100 mL (100 mLs Intravenous Contrast Given 10/21/23 2251)    ED Course/ Medical Decision Making/ A&P   Patient presents to the ED for concern of chest pain, this involves an extensive number of treatment options,  and is a complaint that carries with it a high risk of complications and morbidity.  The differential diagnosis of chest pain includes: Acute coronary syndrome, pericarditis, aortic dissection, pulmonary embolism, tension pneumothorax, pneumonia, and esophageal rupture.    Co morbidities that complicate the patient evaluation  Significant past medical history including lupus, interstitial lung disease, RA, anemia, HTN   Lab Tests:  I Ordered, and personally interpreted labs.  The pertinent results include:  troponin < 15. D-Dimer 1.77   Imaging Studies ordered:  I ordered imaging studies including CXR, CTA  I independently visualized and interpreted imaging which showed no evidence of significant pulmonary embolus, chronic interstitial fibrosis with  honeycomb changes in the bases I agree with the radiologist interpretation  Well's criteria score indicates high risk group for PE in ED population.    Cardiac Monitoring:  The patient was maintained on a cardiac monitor.  I personally viewed and interpreted the cardiac monitored which showed an underlying rhythm of: sinus   Medicines ordered and prescription drug management:  I ordered medication including tylenol  for headache/pain  Reevaluation of the patient after these medicines showed that the patient improved I have reviewed the patients home medicines and have made adjustments as needed   Test Considered:  CT head: patient does have a headache but relieved with tylenol , declined due to no current indication including trauma, stroke symptoms, seizures, syncope, etc.    Problem List / ED Course:  Chest pain    Reevaluation:  After the interventions noted above, I reevaluated the patient and found that they have :improved   Social Determinants of Health:  None    Dispostion:  After consideration of the diagnostic results and the patients response to treatment, I feel that the patent would benefit from discharge  and follow-up with PCP for further diagnosis and treatment. Today we have ruled out the life-threatening diagnoses that are listed in the differential for chest pain. Prescribed three days worth of lasix  for lower extremity edema.  Follow-up with PCP and necessary specialists, please return to the ER if symptoms worsen of if new symptoms develop.  Click here for ABCD2, HEART and other calculatorsREFRESH Note before signing :1}                              Medical Decision Making Amount and/or Complexity of Data Reviewed Labs: ordered. Radiology: ordered.  Risk OTC drugs. Prescription drug management.          Final Clinical Impression(s) / ED Diagnoses Final diagnoses:  Chest wall pain  Peripheral edema    Rx / DC Orders ED Discharge Orders     None         Fonda Hymen, PA-C 10/22/23 0017    Almond Army, MD 10/23/23 1910

## 2023-10-22 MED ORDER — FUROSEMIDE 20 MG PO TABS: 20.0000 mg | ORAL_TABLET | Freq: Every day | ORAL | 0 refills | Status: DC

## 2023-10-22 NOTE — Discharge Instructions (Addendum)
 It was a pleasure taking care of you. Today we investigated the acute possible causes of your chest pain. Today we did a chest xray as well as a CT angiogram which came back negative for acute causes. An EKG was also performed to rule out a heart attack. If your symptoms worsen or change please return to the ED for evaluation. Today you have been prescribed lasix  which will help with your lower leg swelling. Please take this medicine as prescribed in the morning.

## 2023-10-27 DIAGNOSIS — F411 Generalized anxiety disorder: Secondary | ICD-10-CM | POA: Diagnosis not present

## 2023-10-27 DIAGNOSIS — F332 Major depressive disorder, recurrent severe without psychotic features: Secondary | ICD-10-CM | POA: Diagnosis not present

## 2023-11-03 DIAGNOSIS — Z6841 Body Mass Index (BMI) 40.0 and over, adult: Secondary | ICD-10-CM | POA: Diagnosis not present

## 2023-11-03 DIAGNOSIS — K219 Gastro-esophageal reflux disease without esophagitis: Secondary | ICD-10-CM | POA: Diagnosis not present

## 2023-11-03 DIAGNOSIS — I429 Cardiomyopathy, unspecified: Secondary | ICD-10-CM | POA: Diagnosis not present

## 2023-11-03 DIAGNOSIS — M329 Systemic lupus erythematosus, unspecified: Secondary | ICD-10-CM | POA: Diagnosis not present

## 2023-11-03 DIAGNOSIS — R0789 Other chest pain: Secondary | ICD-10-CM | POA: Diagnosis not present

## 2023-11-03 DIAGNOSIS — M25472 Effusion, left ankle: Secondary | ICD-10-CM | POA: Diagnosis not present

## 2023-11-03 DIAGNOSIS — M25471 Effusion, right ankle: Secondary | ICD-10-CM | POA: Diagnosis not present

## 2023-11-05 DIAGNOSIS — M47816 Spondylosis without myelopathy or radiculopathy, lumbar region: Secondary | ICD-10-CM | POA: Diagnosis not present

## 2023-11-10 ENCOUNTER — Other Ambulatory Visit: Payer: Self-pay | Admitting: Primary Care

## 2023-11-18 ENCOUNTER — Ambulatory Visit
Admission: RE | Admit: 2023-11-18 | Discharge: 2023-11-18 | Disposition: A | Discharge status: Home / Self Care | Payer: Medicare HMO | Source: Ambulatory Visit | Attending: Internal Medicine | Admitting: Internal Medicine

## 2023-11-18 DIAGNOSIS — J8489 Other specified interstitial pulmonary diseases: Secondary | ICD-10-CM

## 2023-11-18 DIAGNOSIS — I700 Atherosclerosis of aorta: Secondary | ICD-10-CM | POA: Diagnosis not present

## 2023-11-18 DIAGNOSIS — J849 Interstitial pulmonary disease, unspecified: Secondary | ICD-10-CM | POA: Diagnosis not present

## 2023-11-19 DIAGNOSIS — M069 Rheumatoid arthritis, unspecified: Secondary | ICD-10-CM | POA: Diagnosis not present

## 2023-11-19 DIAGNOSIS — G894 Chronic pain syndrome: Secondary | ICD-10-CM | POA: Diagnosis not present

## 2023-11-23 ENCOUNTER — Other Ambulatory Visit: Payer: Self-pay | Admitting: Internal Medicine

## 2023-11-25 DIAGNOSIS — Z6841 Body Mass Index (BMI) 40.0 and over, adult: Secondary | ICD-10-CM | POA: Diagnosis not present

## 2023-11-25 DIAGNOSIS — M545 Low back pain, unspecified: Secondary | ICD-10-CM | POA: Diagnosis not present

## 2023-11-25 DIAGNOSIS — T8484XA Pain due to internal orthopedic prosthetic devices, implants and grafts, initial encounter: Secondary | ICD-10-CM | POA: Diagnosis not present

## 2023-11-30 DIAGNOSIS — I10 Essential (primary) hypertension: Secondary | ICD-10-CM | POA: Diagnosis not present

## 2023-11-30 DIAGNOSIS — Z6841 Body Mass Index (BMI) 40.0 and over, adult: Secondary | ICD-10-CM | POA: Diagnosis not present

## 2023-11-30 DIAGNOSIS — M549 Dorsalgia, unspecified: Secondary | ICD-10-CM | POA: Diagnosis not present

## 2023-11-30 DIAGNOSIS — G8929 Other chronic pain: Secondary | ICD-10-CM | POA: Diagnosis not present

## 2023-12-01 ENCOUNTER — Ambulatory Visit (INDEPENDENT_AMBULATORY_CARE_PROVIDER_SITE_OTHER): Payer: Medicare HMO | Admitting: Internal Medicine

## 2023-12-01 ENCOUNTER — Encounter: Payer: Self-pay | Admitting: Internal Medicine

## 2023-12-01 ENCOUNTER — Ambulatory Visit: Payer: Medicare HMO | Admitting: Internal Medicine

## 2023-12-01 VITALS — BP 120/80 | HR 102 | Ht 71.0 in | Wt 307.0 lb

## 2023-12-01 DIAGNOSIS — J8489 Other specified interstitial pulmonary diseases: Secondary | ICD-10-CM

## 2023-12-01 DIAGNOSIS — Z01811 Encounter for preprocedural respiratory examination: Secondary | ICD-10-CM

## 2023-12-01 DIAGNOSIS — Z87891 Personal history of nicotine dependence: Secondary | ICD-10-CM

## 2023-12-01 DIAGNOSIS — M359 Systemic involvement of connective tissue, unspecified: Secondary | ICD-10-CM | POA: Diagnosis not present

## 2023-12-01 LAB — PULMONARY FUNCTION TEST
DL/VA % pred: 115 %
DL/VA: 4.78 ml/min/mmHg/L
DLCO cor % pred: 63 %
DLCO cor: 15.93 ml/min/mmHg
DLCO unc % pred: 57 %
DLCO unc: 14.43 ml/min/mmHg
FEF 25-75 Pre: 2.54 L/s
FEF2575-%Pred-Pre: 83 %
FEV1-%Pred-Pre: 53 %
FEV1-Pre: 1.79 L
FEV1FVC-%Pred-Pre: 109 %
FEV6-%Pred-Pre: 49 %
FEV6-Pre: 2.04 L
FEV6FVC-%Pred-Pre: 102 %
FVC-%Pred-Pre: 48 %
FVC-Pre: 2.04 L
Pre FEV1/FVC ratio: 88 %
Pre FEV6/FVC Ratio: 100 %

## 2023-12-01 NOTE — Patient Instructions (Addendum)
 Interstitial lung disease due to connective tissue disease (HCC) High risk medication use Immunosuppressed status  Pulmonary function test is showing/suggesting continued decline. Although this is not clear 1 where the other on the CT scan of the chest. Currently noticed priority is for her to get back surgery   Plan ' -Continue CellCept  through the rheumatologist at [facility] -Continue Plaquenil  through the rheumatologist at [facility] -Noted that you are off prednisone  since 2021  -If pulmonary fibrosis gets worse then we can add nintedanib -Get spirometry and DLCO in 3 months     History of snoring Witnessed apneic spells OSA    - glad you are seeing Dr Shannon [name] and are on CPAP  Plan  - per Dr Shannon [name]   Preoperative respiratory exam- NE ISSUEs  - Low-moderate risk for complications from back surgery -Risks include pulmonary fibrosis flareup and blood clots  Plan - Routine postoperative recovery programs   Follow-up -Return to see Dr. Bertrum [name] 15-minute ILD slot in 3-4 months but after PFT, Spiro and dlco  - symptoms score and walk test in 4 months

## 2023-12-01 NOTE — Patient Instructions (Signed)
Spiro/DLCO performed today. 

## 2023-12-01 NOTE — Progress Notes (Signed)
Spiro/DLCO performed today. 

## 2023-12-01 NOTE — Progress Notes (Signed)
 PCP [name],[name], PA-C Referred by Dr Nicholas Bari  HPI   IOV 08/23/2014 His blood work and a CT scan in a few weeks and come back c Chief Complaint  Patient presents with   Pulmonary Consult    Pt referred by Dr. Alvira [name] for ILD. Pt c/o SOB with activity and rest, dry cough and chest tightness also with and without activity.    53 year old female referred for evaluation of autoimmune interstitial lung disease. She presents with her husband. In 2010 while living in [place] she reports she was diagnosed with lupus associated with rheumatoid arthritis in her joints. In 2012 she moved to live in [place]  and several months after that started noticing insidious onset of shortness of breath. Local rheumatologist diagnosed her with interstitial lung disease. She was referred to Dr. Lindle [name] at [facility] in [facility] and was started on CellCept /prednisone  for autoimmune interstitial lung disease he and however, sometime around 2 years ago she ran out of medical insurance and stopped taking these medications. During this time her dyspnea has progressed. It is currently rated as moderate to severe. It is present on exertion and relieved by rest. Even minimal amount of exertion makes her very ddyspneic. Now she has insurance and she did see Dr. Kenard [name] locally and she has autoimmune panel lab ordered. In addition exam revealed crackles and there for she's been referred here for reevaluation of interstitial lung disease and dyspnea. Dyspnea is associated with some chest tightness but no chest pain. This no associated dizzine   11/24/2014 Follow UP ILD  [name]  She has autoimmune ILD with RA/Lupus  She was seen 6 weeks ago, restarted on Cellcept .  Previously on cellcept  but lost her insurance until recently.  On low dose prednisone  5mg  daily .  Last CT chest 4/4 showed ILD changes similar to 2013. Echo was ok with EFG 55-60%, nml PAP . In  March .   Did not take dapsone  ,due to  GP6D deficiency.  Labs ok last week with nml LFT , no sign change in hbg. /wbc.  She is feeling better. Does feel her breathing is some better.  No flare of cough or wheezing.     OV 01/16/2015  Chief Complaint  Patient presents with   Follow-up    Pt c/o DOE, mild dry cough, and chest tightness when SOB. Pt states the chest tightness has improved).    follow-up interstitial lung disease in the setting of rheumatoid arthritis Follow-up high risk medication use - on CellCept  and prednisone  since mid April 2016  - Presents with her husband. Both give a history. Overall she is doing better in terms of dyspnea after starting CellCept  and prednisone . However the improvement this only moderate. She still left with a residual moderate dyspnea on exertion that is also made worse with bending or heart air and improved with rest and cool air. It is associated with some cough and wheezing. She takes albuterol  inhaler which she feels helps only somewhat but not fully and not quickly enough. She is frustrated by this. In addition she's complaining of some associated right lower back paraspinal spasm for which massage gives her relief. He has never attended pulmonary rehabilitation.  - Lab review shows she has had problems with potassium and has had potassium supplementation. Last lab check was 12/08/2014. She is due for lab test right now   OV 03/28/2015   Chief Complaint  Patient presents with  Follow-up    3 month follow up. Pt states that she is still having some problems with her breathing. Pt c/o of feeling chest tightness, chest pain and cough that is dry. Pt denies wheeze. Pt states that she did trial the Advair and does feel that it helped some.   Follow-up interstitial lung disease secondary to autoimmune process and associated dyspnea that seems out of proportion\  She presents with her husband. She continues on CellCept  and prednisone . In the last  visit approximately 2-3 months ago she had out of proportion dyspnea. I gave her some Advair to try. She says this only helped a little bit. Overall she says that dyspnea still persists. It is worse than in the spring 2016 when she started CellCept  and prednisone . It is stable since July 2016. It is moderate in intensity. Occurs randomly at rest but also with exertion. Occasionally relieved by rest but also happens at rest. Heat and humidity make it worse. Advair helped a little bit only. This no associated chest tightness or wheezing. She did try and enroll  in pulmonary rehabilitation but could not afford the the startup program and is waiting to hear from them for the maintenance program. She is frustrated by all the symptoms.  Pulmonary function test October 15,016 today FVC 2.55 L/64%, total lung capacity 4 L/65% and diffusion 14.5/42%. Overall no change since April 2016   OV 07/04/2015  Chief Complaint  Patient presents with   Follow-up    pt. states breathing is baseline. SOB. dry cough. wheezing. occ. chest pain/tightness. feels her airway is blocked.   Follow-up interstitial lung disease admitted autoimmune processes and associated out of proportion dyspnea  She presents again with her husband. She continues on CellCept  2 g twice daily associated with prednisone . She cannot take Bactrim or dapsone  due to G6PD deficiency. Last seen in the fall of 2016. She was having out of proportion dyspnea. Rated cardia pulmonary stress test and she could not tolerate this test. She then underwent right heart catheterization mid-November 2016 with Dr. Peder [name]. Review the tests this is normal. Overall stable but she and husband still continued to complain about this resting dyspnea associated with wheezing. They hear noises in the chest. Sometime she gasps for air even at rest. She feels it comes from the chest but the husband points to the throat. I offered second opinion at [facility] because of  this unusual symptoms but they declined citing distance. She was supposed to attend pulmonary rehabilitation but they cannot afford a $60 co-pay twice a week 8 weeks. Offered ENT evaluation that agreeable but they wanted done in [place] which is closer logistically. In addition patient is contemplating now applying for disability. 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  Walking desaturation test 185 feet 3 laps and rheumatic: No desaturation    OV 10/12/2015  Chief Complaint  Patient presents with   Follow-up    PFT today.  breathing is better.  Pt is currently on STD, [employer] approved STD until today, wants to know today if pt is able to return to work, any restrictions.  Pt states that she has been more stable while out of work.      Follow-up interstitial lung disease due to autoimmune processes not otherwise specified  Last seen January 2017. Since then she has been on short-term disability and out of work. She does heavy manual work. The lack of work estimated dyspnea better. She did pulmonary function test today  that I personally visualized and overall it is same. There are no new issues. She is on CellCept  and she is tolerating this fine. Last visit she had some anemia we follow this up and the anemia was better. Also hypokalemia resolved by February 2017. She is due for blood work today. 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 This showed up at last visit. She saw an ENT in [place] within referred her to [facility]. She does not want to go to [facility]. She ended up seeing Dr. Odean Bend locally. But now she is going to see Dr.  Brandon [facility]   Pulmonary function test today 10/12/2015 shows postbronchodilator FVC 2.65 L/67%. FEV1 2.34 L/72% which is up 17% positive bronchodilator response. Ratio of 80/106%. Total lung capacity of 3.8/62%. DLCO 18.36/52%.  Overall consistent with restriction and lung parenchymal disease. Overall PFTs are stable compared to April 2016 but perhaps in DLCO slightly better   OV 04/15/2016  Chief Complaint  Patient presents with   Interstitial Lung Disease    Breathing is unchanged since last OV. Reports SOB, coughing. Cough is non productive. Denies chest tightness or wheezing.   Follow-up interstitial lung disease due to autoimmune processes not otherwise specified. On CellCept  and prednisone . Not on Bactrim prophylaxis due to G6PD deficiency  Last visit April 2017. At that time based on pulmonary function tests showed stability and ILD for a year. 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 She did see local ENT doctor [place] and apparently has been reassured. At this point in time she is applying for long-term disability at work but the work discharged from services and she is now applying for Social Security disability. Her dyspnea stable since the interim. She's also had hysterectomy but for the last few weeks her cough is worse and it is dry. This no fever. Is no weight loss or chills. She says she's compliant with her CellCept  and prednisone .  OV 05/29/2016  Chief Complaint  Patient presents with   Follow-up    Pt states she still has harsh dry cough, pt states her SOB is unchanged and chest tightness when SOB or when coughing a lot. Pt deneis f/c/s.     Follow-up interstitial lung disease due to autoimmune processes not otherwise specified. On CellCept  and prednisone  since April 2016. Not on Bactrim prophylaxis due to G6PD deficiency  Ariah returns for follow-up. She had high resolution CT chest that shows stability and interstitial lung disease since April 2016. She Pulmicort  function test that shows mild improvement since April 2016. Therefore it appears that her CellCept  and prednisone  is helping her she'll lung disease related to collagen-vascular disease. However she  tells me that she continues to have chronic cough for the last few to several months. It is progressive. It is dry. It is worse than her baseline. The dyspnea is unchanged. This no fever or weight loss or chills   OV 07/24/2016  Chief Complaint  Patient presents with   Follow-up    Pt states she feels the Arnuity has been helping her breathing. Pt c/o dry cough and occ chest tightness.   rec    ICD-9-CM ICD-10-CM   1. ILD (interstitial lung disease) (HCC) 515 J84.9   2. Chronic cough 786.2 R05   3. High risk medication use V58.69 Z79.899    Clinically improved with cellcept /prednisone  and arnuity  Blood work ok dec 2017  pLAN - start pulm rehab at [facility] - continue  aruity daily - continue cellcept  and prednisone   Followup - 6 months do Pre-bd spiro and dlco only. No lung volume or bd response. No post-bd spiro - 6 months fu Dr Bertrum Brodie after above or sooner if needed     12/15/2016  f/u ov/Wert re: cough dry on arnuity  Chief Complaint  Patient presents with   Follow-up    Breathing is unchanged since her last visit. Pt states she is here to f/u on recent ABG result. She c/o feeling tired all of the time. She has occ cough- non prod.       Finished at Rehab   beginning  Of May  2018 and trouble walking fast or uphill Waking up with ha's since rehab sev days a week  Cough is new x one month mostly dry and day > noct   No obvious day to day or daytime variability or assoc excess/ purulent sputum or mucus plugs or hemoptysis or cp or chest tightness, subjective wheeze or overt sinus or hb symptoms. No unusual exp hx or h/o childhood pna/ asthma or knowledge of premature birth.   OV 02/13/2017  Chief Complaint  Patient presents with   Follow-up    cough/ILD follow up - prod cough with brownish/green mucus with tightness and chest pain x2 days.  denies any f/c/s, hemoptysis.  PFT done today.    Follow-up interstitial lung disease due to autoimmune processes not  otherwise specified. On CellCept  and prednisone  since April 2016. Not on Bactrim prophylaxis due to G6PD deficiency  53 year old female immunosuppressed with CellCept  and prednisone . Not seen her in many months. Currently taking prednisone  3 mg per day and CellCept  1000 mg twice daily. She cannot do Bactrim prophylaxis because of G6PD deficiency. She tells me that overall her health has been stable but in the last few weeks noticing increased shortness of breath in the last few days this increased cough and change in color of sputum to green and increased chest tightness and a feeling that she is getting acute bronchitis. She also was contemplated going to the emergency room a few days ago but now she is better. There is no obvious fever or chills or hemoptysis or edema paroxysmal nocturnal dyspnea or orthopnea. Pulmonary function test today shows 10% decline in FVC and DLCO compared to baseline.   OV 06/08/2017  Chief Complaint  Patient presents with   Follow-up    Feeling about the same as last visit. Still having chest tightness and wheezing at times, Sounds hoarse.     Follow-up interstitial lung disease due to autoimmune processes not otherwise specified. On CellCept  and prednisone  since April 2016. Not on Bactrim prophylaxis due to G6PD deficiency. Normal Right heart cath Nov 2016.  Last high-resolution CT November 2017  Last visit August 2018.  There is a routine follow-up.  Overall she feels stable.  FVC shows stability since August 2018 but declined since 1 year ago.  DLCO shows continued decline.  Though her lung health is stable.  She says she lost her 1 only biological sister last week due to lupus.  The sister was only 49 and lived in [place].  There are no new issues. Walking desaturation test on 06/08/2017 185 feet x 3 laps on ROOM AIR:  did not desaturate. Rest pulse ox was 100%%, final pulse ox was 100%. HR response 81/min at rest to 109/min at peak exertion. Patient Shannon  A  [name]  Did not Desaturate < 88% . Shannon [name] did nto  Desaturated </= 3% points. EMMA BIRCHLER yes did get tachyardic   OV 10/15/2017   Follow-up interstitial lung disease due to autoimmune processes not otherwise specified. On CellCept  and prednisone  since April 2016. Not on Bactrim prophylaxis due to G6PD deficiency. Normal Right heart cath Nov 2016 and feb 2019.  Last high-resolution CT November 2017 and dec 2018 without progresion   Last visit December 2018.  This is a routine follow-up.  In the interim she had high-resolution CT scan of the chest that did not show any progression in interstitial lung disease between 2017 and 2018.Aaron Aas  Therefore we did an echocardiogram that showed slight elevation in pulmonary artery systolic pressure.  Therefore we sent it to her repeat right heart catheterization and this was normal as documented below.  Overall she feels stable compared to one year ago but has declined compared to 2 years ago.  She is also complaining about a new recurrence of cough that happens mostly at night despite Symbicort  and prednisone  and CellCept .  It wakes her up.  It is moderate in intensity.  There is no associated wheezing or edema orthopnea.  It happens randomly.  There is associated heartburn.  She is on nothing for acid reflux.       Right Heart Pressures 08/20/17 [facility] Procedural Findings: Hemodynamics (mmHg) RA mean 3 RV 30/6 PA 23/8, mean 14 PCWP mean 8  Oxygen  saturations: PA 72% AO 98%  Cardiac Output (Fick) 6.73  Cardiac Index (Fick) 3.06  Cardiac Output (Thermo) 6.94 Cardiac Index (Thermo) 3.15    OV 12/01/2017  Chief Complaint  Patient presents with   Follow-up    Pt has SOB with exertion, wheezing, some chest tightness. Pt was dry cough more at bedtime.      Follow-up interstitial lung disease due to autoimmune processes not otherwise specified. On CellCept  and prednisone  since April 2016. Not on Bactrim prophylaxis due to G6PD deficiency.  Normal Right heart cath Nov 2016 and feb 2019.  Last high-resolution CT November 2017 and dec 2018 without progresion   This visit is to see if she is got progressive interstitial lung disease.  Last visit she was complaining of more cough.  Therefore we added some acid reflux treatment.  She says after the acid reflux treatment symptoms have actually improved.  This contradicts what she told the CMA at intake.  Overall she is feeling stable.  However she does have a new episode of orthostatic dizziness going on at a mild level for the last 1 week.  Today after doing pulmonary function test she did feel dizzy.  Therefore we checked orthostatics on her and she got tachycardic standing up and her blood pressure did drop although still within normal limits.  She does not have any chest pain.  Lung function test shows continued stability in the last 1 year including compared to the most recent attempt.  OV 03/08/2018  Subjective:  Patient ID: Shannon [name], female , DOB: Oct 30, 1970 , age 3 y.o. , MRN: [id number] , ADDRESS: [address]  [place] [facility] [address]   03/08/2018 -   Chief Complaint  Patient presents with   Follow-up    Spirometry performed today. Pt states she is still having some mild problems with dizziness but not as bad as last visit. Pt also states she has been having problems with headaches x1 month. Pt states her breathing is about the same  as last visit and also states she still has the dry cough.    Follow-up interstitial lung disease due to autoimmune processes not otherwise specified. On CellCept  and prednisone  since April 2016. Not on Bactrim prophylaxis due to G6PD deficiency. Normal Right heart cath Nov 2016 and feb 2019.  Last high-resolution CT November 2017 and dec 2018 without progresion     HPI BRONNIE VASSEUR 53 y.o. - connective tissue disease ILD. Follow-up. Last seen June 2019 by she started having new onset dizziness. It seemed orthostatic. She says since then it is  spontaneously improved but still persists. She is also dealing with fatigue issues. In 02/26/2018 she felt she had a lupus flare saw Dr. Baker Bon and given a prednisone  burst. Autoimmune profile at that time showed continued positive ANA titer but normal complements. She had a CBC that showed a drop in hemoglobin by 1 g percent. Her baseline is around 11.5 g percent and currently it is around 10.5 g percent. She says after the prednisone  burst a lupus flare symptoms and fatigue have improved although it still persists. If it is actually worse in the last few months. She had pulmonary function test that shows continued improvement especially following the recent prednisone  burst. Walking desaturation test was normal other than tachycardia. We observed her to be walking very slowly in a very deconditioned and fatigued fashion. 02/26/2089 and liver function and renal function reviewed and this was normal. Medication review shows she is on oxycodone  and gabapentin  for back pain. She says she's been on gabapentin  for the last 1 year. It appears that the temporal sequence of fatigue and dizziness might be related to gabapentin  but she is not so sure.       Results for TIYA, SCHRUPP (MRN [id number]) as of 03/08/2018 10:51  Ref. Range 02/26/2018 10:53  Hemoglobin Latest Ref Range: 11.7 - 15.5 g/dL 40.9 (L)  Results for KANG, ISHIDA (MRN [id number]) as of 03/08/2018 10:51  Ref. Range 02/26/2018 10:53  Creatinine Latest Ref Range: 0.50 - 1.10 mg/dL 9.56  Results for KOBIE, MATKINS (MRN [id number]) as of 03/08/2018 10:51  Ref. Range 02/26/2018 10:53  AST Latest Ref Range: 10 - 35 U/L 19  ALT Latest Ref Range: 6 - 29 U/L 18   Results for ADELENE, POLIVKA (MRN [id number]) as of 03/08/2018 10:51  Ref. Range 02/26/2018 10:53  Anti Nuclear Antibody(ANA) Latest Ref Range: NEGATIVE  POSITIVE (A)  ANA Pattern 1 Unknown SPECKLED (A)  ANA Titer 1 Latest Units: titer > OR = 1:1280 (A)  ds DNA Ab Latest Units: IU/mL <1  C3  Complement Latest Ref Range: 83 - 193 mg/dL 413  C4 Complement Latest Ref Range: 15 - 57 mg/dL 33    ROS - per HPI    OV 06/03/2018  Subjective:  Patient ID: Shannon [name], female , DOB: 18-Sep-1970 , age 44 y.o. , MRN: [id number] , ADDRESS: [address]  [place] [facility] [address]   06/03/2018 -   Chief Complaint  Patient presents with   Follow-up    pt states breathing is baseline. c/o sob with exertion, non prod cough & wheezing    Follow-up interstitial lung disease due to autoimmune processes not otherwise specified. On CellCept  and prednisone  since April 2016. Not on Bactrim prophylaxis due to G6PD deficiency. Normal Right heart cath Nov 2016 and feb 2019.  Last high-resolution CT November 2017 and dec 2018 without progresion   HPI MIKELL CAMP 53 y.o. -presents for follow-up.  Last visit September 2019.  At that time CBC showed anemia.  We repeated the hemoglobin 1 month later and was stable around 10 g%.  She tells me that overall she is stable.  Last visit she had dizziness and I told her to stop gabapentin  which she did and her dizziness and fatigue have improved.  She feels she is stable but in the last 3 days she has had a dry cough but no fever or congestion or wheezing or hemoptysis or edema.  No worsening shortness of breath or chest tightness.  Other than that she feels good.  She did up walking desaturation test today and it appears similar to previous visit.       OV 05/13/2019  Subjective:  Patient ID: Shannon [name], female , DOB: [birth year] , age 4 y.o. , MRN: [id number] , ADDRESS: [address] [place] [address]   05/13/2019 -   Chief Complaint  Patient presents with   Follow-up    Pt just had total left knee replacement mid October 2020. Pt said she still has problems with SOB especially with going upstairs. Pt also has occ wheezing but denies any real complaints of cough.   Follow-up interstitial lung disease due to autoimmune processes not  otherwise specified. On CellCept  and prednisone  since April 2016. Not on Bactrim prophylaxis due to G6PD deficiency. Normal Right heart cath Nov 2016 and feb 2019.  Last high-resolution CT November 2017 and dec 2018 without progression. Last PFT Sept 2019  HPI SPENCER PETERKIN 53 y.o. -returns for follow-up.  Last seen in September 2019 and because of the pandemic things got delayed.  She was supposed to see me in 6 months with spirometry and high-resolution CT chest for progression but this did not happen.  At this follow-up we do not have those data points as yet.  She tells me the interim she has had knee surgery and therefore she is not able to do a simple walk test with us .  She has gained some weight.  Also in the interim she is now on Social Security disability and she is unemployed now.  She feels her shortness of breath is slightly worse particularly when walking up stairs but otherwise overall she feels stable.  I reviewed her blood work.  And it seems anemia is worse in the 9 g%.  She really denies any bleeding.  Normally she runs between 10 and 11 g%.  I noticed that she still not had a shingles vaccine.   OV 01/09/2020   Subjective:  Patient ID: Shannon [name], female , DOB: 11/18/1970, age 29 y.o. years. , MRN: [id number],  ADDRESS: [address] [place] [address] PCP  Clydene Darner, PA-C Providers : Treatment Team:  Attending Provider: Maire Scot, MD    Follow-up interstitial lung disease due to autoimmune processes not otherwise specified. On CellCept  and prednisone  since April 2016. Not on Bactrim prophylaxis due to G6PD deficiency. Normal Right heart cath Nov 2016 and feb 2019.  Last high-resolution CT November 2017 and dec 2018 and dec 2020 without progression. Last PFT Sept 2019 and now June 2021  Chief Complaint  Patient presents with   Follow-up       HPI BRANDON SCARBROUGH 53 y.o. -returns for follow-up.  Since her last visit she continues to be stable.   She is only on CellCept .  She is also on Plaquenil .  She is not on prednisone  or antifibrotic's.  Her dyspnea is  stable.  Her pulmonary function test is improved and a CT chest shows stability.  The main issue is that she says over the last 3 years she has gained 30 to 40 pounds of weight.  She says that she is snoring a lot according to her husband.  Apparently the husband's also noticed apneic spells.  She says she has had the Covid vaccine but is not documented in our chart.  She is yet to have the Shingrix vaccine.       Results for SHIZUYE, RUPERT (MRN [id number]) as of 02/13/2017 12:16  Ref. Range 10/09/2014 09:39 03/28/2015 11:40 10/12/2015 13:42 05/06/2016 10:38 02/13/2017 10:49 06/08/2017  12/01/2017  03/08/2018  11/24/19  FVC-Pre Latest Units: L 2.54 2.55 2.53 2.63 2.32 2.31 2.38 2.46 2.67  FVC-%Pred-Pre Latest Units: % 64 64 64 67 59 59% 61% 67% 73%   Results for JENEEN, DOUTT (MRN [id number]) as of 02/13/2017 12:16  Ref. Range 10/09/2014 09:39 03/28/2015 11:40 10/12/2015 13:42 05/06/2016 10:38 02/13/2017 10:49 06/08/2017  12/01/2017  03/08/2018  11/24/19  DLCO unc Latest Units: ml/min/mmHg 15.03 14.50 18.36 18.31 16.35 14.21 16.38 x 18.56  DLCO unc % pred Latest Units: % 43 42 53 53 47 41 47% x 72%    ROS - per HPI Results for ELENY, CORTEZ (MRN [id number]) as of 01/09/2020 16:33  Ref. Range 12/21/2019 22:12  Creatinine Latest Ref Range: 0.44 - 1.00 mg/dL 0.86  Results for WAYLYNN, BENEFIEL (MRN [id number]) as of 01/09/2020 16:33  Ref. Range 12/21/2019 22:12  Hemoglobin Latest Ref Range: 12.0 - 15.0 g/dL 52.8 (L)   IMPRESSION: dec 2020 compared to 2018 1. The appearance of the lungs remains compatible with interstitial lung disease, with a spectrum of findings considered indeterminate for usual interstitial pneumonia (UIP) per current ats guidelines. However, given the spectrum of findings and the stability of the findings compared to the prior study, this is once again strongly favored to  represent nonspecific interstitial pneumonia (NSIP).     Electronically Signed   By: Alexandria Angel M.D.   On: 06/15/2019 09:42 ROS - per HPI    OV 07/27/2020  Subjective:  Patient ID: Shannon [name], female , DOB: [birth year] , age 5 y.o. , MRN: [id number] , ADDRESS: [address] [place] [address] PCP Clydene Darner, PA-C Patient Care Team: [name], [name], PA-C as PCP - General (Internal Medicine)  This Provider for this visit: Treatment Team:  Attending Provider: Maire Scot, MD    07/27/2020 -   Chief Complaint  Patient presents with   Follow-up    Get pft results.   Follow-up interstitial lung disease due to autoimmune processes not otherwise specified. -Lupus per [facility] clinic notes in December 2021.   - On CellCept  and prednisone  since April 2016.  Off prednisone  in 2021  Not on Bactrim prophylaxis due to G6PD deficiency.    - Normal Right heart cath Nov 2016 and feb 2019  -  Last high-resolution CT November 2017 and dec 2018 and dec 2020 without progression.    - Last PFT Sept 2019 and now June 2021 and feb 2022  Chief Complaint  Patient presents with   Follow-up      HPI MERRIEL ZINGER 53 y.o. -returns for routine ILD follow-up.  Last seen in July 2021.  Clinically she is stable.  Since last seeing me November 2020 when she had right knee surgery.  She uses a cane.  She is also seen Dr. Bernard Brick in sleep  clinic.  She is now on CPAP and it is helping her.  Her weight gain persists.  Her mobility has therefore gone down but from a dyspnea standpoint she is stable.  She is up-to-date with her COVID vaccine.  With the booster she did get symptomatic with Sirs response.  She continues on CellCept .  She is now seeing [facility].  She does not see [facility] anymore.  She believes she has lupus.  She has an appointment upcoming in a few days.  Review of the records at [facility] indicate t that she has lupus.  She continues to  be off prednisone .  She is unable to do a walk test today because of her knee issues and also she is on a cane.  She has pulmonary function test there is a 2-3.8% decline in FVC/DLCO.  This could easily be because of weight gain.  Her symptom scores are stable.  She did have CT angiogram in the summer 2021 this ruled out pulmonary embolism.  There is no comment about ILD.  But then it is a contrast CT.    OV 03/05/2021  Subjective:  Patient ID: Shannon [name], female , DOB: [birth year] , age 4 y.o. , MRN: [id number] , ADDRESS: [address] [place] [address] PCP Clydene Darner, PA-C Patient Care Team: [name], [name], PA-C as PCP - General (Internal Medicine)  This Provider for this visit: Treatment Team:  Attending Provider: Maire Scot, MD    03/05/2021 -   Chief Complaint  Patient presents with   Follow-up    PFT performed today.  Pt states she has been doing okay since last visit and states that her breathing is about the same.   Follow-up interstitial lung disease due to autoimmune processes not otherwise specified. -Lupus per [facility] clinic notes in December 2021.   - On CellCept  and prednisone  since April 2016.  Off prednisone  in 2021  Not on Bactrim prophylaxis due to G6PD deficiency.    - Normal Right heart cath Nov 2016 and feb 2019  -  Last high-resolution CT November 2017 and dec 2018 and dec 2020 without progression. - > July 2021 CTA   - Last PFT Sept 2019 and now June 2021 and feb 2022 and sept 2022  HPI ADRENA NAKAMURA 53 y.o. -returns for follow-up.  She continues on her immunosuppressive regimen through [facility] program.  She is happy with the care there.  She is not on prednisone .  She uses CPAP through Dr. Deanna Expose.  Overall she is feeling stable.  Symptom scores are stable.  She had pulmonary function test and it shows that her FVC is stable but DLCO is declined.  This correlates with a hemoglobin around 10 last done in June 2022 at  [facility].  But then she is also chronically anemic.  She is definitely not noticing any worsening in her shortness of breath.  Her walking desaturation test is also stable.  She wants her flu shot today.        PFT  OV 03/18/2022  Subjective:  Patient ID: Shannon [name], female , DOB: 1971-06-12 , age 57 y.o. , MRN: [id number] , ADDRESS: [address] [place] [address] PCP Clydene Darner, PA-C Patient Care Team: [name], [name], PA-C as PCP - General (Internal Medicine)  This Provider for this visit: Treatment Team:  Attending Provider: Maire Scot, MD    03/18/2022 -   Chief Complaint  Patient presents with  Follow-up    Pt states she is better compared to last visit and states the cough is better.   HPI DANISE DEHNE 53 y.o. -returns for follow-up.  I last saw her 1 year ago.  She continues on CellCept  and Plaquenil  through [facility].  Shortness of breath is stable.  She had high-resolution CT chest in March 2023 and that is without progression.  She has no other new issues.  She did in the interim tear her rotator cuff on the left side and had surgery.  She is having some sciatic problems.  She will have the flu shot today.  She follows with Dr. Deanna Expose for sleep apnea.    CT Chest data HRCT March 2023  IMPRESSION: Basilar and subpleural predominant ground-glass and septal thickening with air trapping, similar to 12/22/2019. Assessment is somewhat limited by respiratory motion and body habitus. Nonspecific interstitial pneumonitis is favored. Findings are indeterminate for UIP per consensus guidelines: Diagnosis of Idiopathic Pulmonary Fibrosis: An Official ATS/ERS/JRS/ALAT Clinical Practice Guideline. Am Annie Barton Crit Care Med Vol 198, Iss 5, [phone], Feb 21 2017.     Electronically Signed   By: Shearon Denis M.D.   On: 08/28/2021 11:12    No results found.     OV 10/29/2022  Subjective:  Patient ID: Shannon [name], female , DOB:  [birth year] , age 55 y.o. , MRN: [id number] , ADDRESS: [address] [place] [address] PCP Clydene Darner, PA-C Patient Care Team: [name], [name], PA-C as PCP - General (Internal Medicine)  This Provider for this visit: Treatment Team:  Attending Provider: Maire Scot, MD 10/29/2022 -   Chief Complaint  Patient presents with   Follow-up    No complaints.  PFT tomorrow.     HPI NEEKA URISTA 53 y.o. -returns for follow-up of her ILD due to lupus.  She continues to be stable.  She is supposed a pulmonary function test but she got the visit scheduled today and the pulmonary function schedule tomorrow 10/30/2022.  Nevertheless she feels stable.  She continues on CellCept  through [facility] rheumatology.  She has no new issues.  Her main complaint is that she has low backache because of degenerative disc disease in the L4-L5 and L5-S1 areas.  She send procedure is being considered.  I have asked her to consider professional stretching through several, she will stretch facilities that are available.  The commercial facilities available in the local area stretch zone, stretch med and stretch lab.  Asked her to get clearance from her doctor.  She is willing to try this.  I did indicate to her that it could be a out-of-pocket cost.  There is clinical research evidence showing stretching to reduce pain and improve mobility.      OV 08/17/2023  Subjective:  Patient ID: Shannon [name], female , DOB: December 24, 1970 , age 76 y.o. , MRN: [id number] , ADDRESS: [address] [place] [address] PCP Clydene Darner, PA-C Patient Care Team: [name], [name], PA-C as PCP - General (Internal Medicine)  This Provider for this visit: Treatment Team:  Attending Provider: Maire Scot, MD  08/17/2023 -   Chief Complaint  Patient presents with   Follow-up     HPI MIMIE GOERING 53 y.o. -is a routine follow-up.  Last seen in May 2024.  She was then November 2020 for visit.  She says she  continues to be stable.  The symptom score suggests continued stability.  The exercise hypoxemia test  shows continued stability.  In November 2024 she had pulmonary function testing that shows a decline compared to 2022 but also this.  She is gaining a lot of weight.  In the last 1 year she has lost 17 pounds of weight but overall she is morbidly obese.  Past medical issues include spinal stimulator since October 2024 for her back and also being on metformin  for insulin  resistance  No change in ongoing immunosuppressive medication.  CT Chest data from date: march 2023  IMPRESSION: Basilar and subpleural predominant ground-glass and septal thickening with air trapping, similar to 12/22/2019. Assessment is somewhat limited by respiratory motion and body habitus. Nonspecific interstitial pneumonitis is favored. Findings are indeterminate for UIP per consensus guidelines: Diagnosis of Idiopathic Pulmonary Fibrosis: An Official ATS/ERS/JRS/ALAT Clinical Practice Guideline. Am Annie Barton Crit Care Med Vol 198, Iss 5, [phone], Feb 21 2017.     Electronically Signed   By: Shearon Denis M.D.   On: 08/28/2021 11:12   OV 12/01/2023  Subjective:  Patient ID: Shannon [name], female , DOB: [birth year] , age 53 y.o. , MRN: [id number] , ADDRESS: [address] [place] [address] PCP Clydene Darner, PA-C Patient Care Team: Olin Bertin as PCP - General (Internal Medicine)  This Provider for this visit: Treatment Team:  Attending Provider: Maire Scot, MD    12/01/2023 -   Chief Complaint  Patient presents with   Follow-up    Follow up , ILD , needs clearance for surgery  for back sugery hasn't  set up as of yet         Follow-up interstitial lung disease due to autoimmune processes not otherwise specified. -Lupus per [facility] clinic notes in December 2021.   - On CellCept   - s/p Prednisone  since April 2016 - Off prednisone  in 2021   - Not on Bactrim prophylaxis due to  G6PD deficiency.    - Normal Right heart cath Nov 2016 and feb 2019  -  Last high-resolution CT November 2017 and dec 2018 and dec 2020 without progression. - > July 2021 CTA -. HRCT March 2023 - no change sinc 2021     - Last PFT June 2025   HPI DARLEENE CUMPIAN 53 y.o. -returns for follow-up.  This visit is to assess for progression of ILD.  Her symptom score seems slowly worse compared to 2 years ago.  She feels because of weight gain.  She is also reporting that the neurostimulator in her back is repositioned and she requires another surgery and this could also be contributing to increased shortness of breath via virtue of worsening back pain.  Exercise hypoxemia test is stable.  However pulmonary function test shows continued decline.  She did have a high-resolution CT chest in the spring 2025 but this was only compared to earlier in the year and it is noted as stable.  But at least in 2022 pulmonary function test seems to show continued decline both FVC and DLCO.  At this point in time her priority is to get her back surgery.  She continues on CellCept .       SYMPTOM SCALE - ILD 05/13/2019  07/27/2020  03/05/2021  03/18/2022  10/29/2022  08/17/2023  Cellcept  + Not on pred Singular + 12/01/2023 Cellcept  + Not on pred Singular +  O2 use RA ra ra ra ra ra   Shortness of Breath 0 -> 5 scale with 5 being worst (score 6 If unable to do)  At rest 2 1 1 2 3 1 1   Simple tasks - showers, clothes change, eating, shaving 3 2 2 3 2 2 2   Household (dishes, doing bed, laundry) 3 3 4 3 3 2 3   Shopping 3 4 3  Did not ansewr No aswer 3 3  Walking level at own pace 2 3 3 4 2 3 3   Walking up Stairs 5 5 3 4 1 3 4   Total (40 - 48) Dyspnea Score 29 18 16 16 11 14 16   How bad is your cough? 0 2 0 0 2 2 1   How bad is your fatigue 3 0 3 2 0 4 3  nausea  0 0 0 0 0 x  vomit  0 0 0 0 0 x  diarrhea  0 0 0 4 0 0  anxiety  4 4 4 3 3  0  depression  4  3 z 3 4          Simple office walk 185  feet x  3 laps goal with forehead probe 03/08/2018  06/03/2018  T.d  10/29/2022  08/17/2023   O2 used Room air Room iar ra ra ra  Number laps completed 3 3 x 250 feet  Sit/stand test times 10 reps Sit and stand x 15 time  Comments about pace Slow pace normal nl    Resting Pulse Ox/HR 100% and 96/min 100% and 76 100% and 98/min And 98% and heart rate of 101   Final Pulse Ox/HR 100% and 112/min 96% and 121 97% and 135/min 96/98% with heart rate of 115   Desaturated </= 88% n no no no   Desaturated <= 3% points no Yes, 4 points Yes, 3 no   Got Tachycardic >/= 90/min yes yes yes yes   Symptoms at end of test Mild fatigue Mild dyspnea mild Level 6 out of 10 dyspnea   Miscellaneous comments Very slow pace  Avg pace Steady pace         SIT STAND TEST - goal 15 times   12/01/2023    O2 used ra   PRobe - finter or forehead fing   Number sit and stand completed - goal 15 15   Time taken to complete 2 min   Resting Pulse Ox/HR/Dyspnea  99% and 99/min and dyspnea of 0/10    Peak measures 98 % and 115/min and dyspnea of 4/10   Final Pulse Ox/HR 99% and 103/min and dyspnea of 2/10   Desaturated </= 88% no   Desaturated <= 3% points no   Got Tachycardic >/= 90/min yes   Miscellaneous comments x       PFT     Latest Ref Rng & Units 12/01/2023   10:52 AM 05/08/2023   10:57 AM 03/05/2021    2:58 PM 07/27/2020    8:55 AM 11/24/2019   11:04 AM 03/08/2018   10:35 AM 12/01/2017    2:12 PM  PFT Results  FVC-Pre L 2.04  P 2.13  2.64  2.59  2.67  2.46  2.38   FVC-Predicted Pre % 48  P 50  73  72  73  67  61   Pre FEV1/FVC % % 88  P 86  85  88  83  84  83   FEV1-Pre L 1.79  P 1.84  2.24  2.28  2.21  2.07  1.98   FEV1-Predicted Pre % 53  P 54  77  78  75  70  62  DLCO uncorrected ml/min/mmHg 14.43  P 14.28  15.34  19.54  18.56   16.38   DLCO UNC% % 57  P 56  60  76  72   47   DLCO corrected ml/min/mmHg 15.93  P 14.28  15.34  19.54  20.32     DLCO COR %Predicted % 63  P 56  60  76  79     DLVA  Predicted % 115  P 94  97  117  122   78     P Preliminary result     arrative & Impression  CLINICAL DATA:  Diffuse/interstitial lung disease.   EXAM: CT CHEST WITHOUT CONTRAST   TECHNIQUE: Multidetector CT imaging of the chest was performed following the standard protocol without intravenous contrast. High resolution imaging of the lungs, as well as inspiratory and expiratory imaging, was performed.   RADIATION DOSE REDUCTION: This exam was performed according to the departmental dose-optimization program which includes automated exposure control, adjustment of the mA and/or kV according to patient size and/or use of iterative reconstruction technique.   COMPARISON:  10/21/2023 and 08/27/2021.   FINDINGS: Cardiovascular: Heart is enlarged.  No pericardial effusion.   Mediastinum/Nodes: No pathologically enlarged mediastinal or axillary lymph nodes. Hilar regions are difficult to definitively evaluate without IV contrast. Small axillary lymph nodes are similar to 08/27/2021. Esophagus is grossly unremarkable.   Lungs/Pleura: Image quality is degraded by expiratory phase imaging and respiratory motion. Basilar predominant subpleural reticulation and probable honeycombing. No pleural fluid. Airway is unremarkable. There is air trapping.   Upper Abdomen: Cholecystectomy. Visualized portions of the liver, adrenal glands, kidneys, spleen, pancreas, stomach and bowel are otherwise grossly unremarkable. No upper abdominal adenopathy.   Musculoskeletal: Spinal stimulator wires. Minimal degenerative change in the spine.   IMPRESSION: 1. Image quality is degraded by expiratory phase imaging and respiratory motion. Pulmonary parenchymal pattern of basilar predominant interstitial lung disease, as detailed above, likely similar to 10/21/2023. Findings are categorized as probable UIP per consensus guidelines: Diagnosis of Idiopathic Pulmonary Fibrosis: An Official  ATS/ERS/JRS/ALAT Clinical Practice Guideline. Am Annie Barton Crit Care Med Vol 198, Iss 5, [phone], Feb 21 2017. 2.  Aortic atherosclerosis (ICD10-I70.0).     Electronically Signed   By: Shearon Denis M.D.   On: 11/23/2023 10:5    LAB RESULTS last 96 hours No results found.       has a past medical history of Anemia, Anxiety, Bilateral swelling of feet, Bronchitis, DDD (degenerative disc disease), lumbosacral, Depression, Dilated cardiomyopathy (HCC), Dyspnea, G6PD deficiency, GERD (gastroesophageal reflux disease), Hypertension, ILD (interstitial lung disease) (HCC), Lactose intolerance, Lupus, Osteoarthritis, Palpitations, Pneumonia, Rheumatoid arthritis (HCC), SLE (systemic lupus erythematosus) (HCC), and Sleep apnea.   reports that she quit smoking about 9 years ago. Her smoking use included cigarettes. She started smoking about 23 years ago. She has a 7 pack-year smoking history. She has never used smokeless tobacco.  Past Surgical History:  Procedure Laterality Date   ABDOMINAL HYSTERECTOMY     APPENDECTOMY  1980   cardiac catherization  08/20/2017   CARDIAC CATHETERIZATION N/A 05/10/2015   Procedure: Right Heart Cath;  Surgeon: Darlis Eisenmenger, MD;  Location: [facility] INVASIVE CV LAB;  Service: Cardiovascular;  Laterality: N/A;   CESAREAN SECTION  '95, '02, '07   X 3   CHOLECYSTECTOMY  2010   LAPAROSCOPIC HYSTERECTOMY  10/2015   have ovaries   RIGHT HEART CATH N/A 08/20/2017   Procedure: RIGHT HEART CATH;  Surgeon: Darlis Eisenmenger, MD;  Location: [facility] INVASIVE CV LAB;  Service: Cardiovascular;  Laterality: N/A;   SHOULDER ARTHROSCOPY WITH ROTATOR CUFF REPAIR AND SUBACROMIAL DECOMPRESSION Left 08/13/2022   Procedure: SHOULDER ARTHROSCOPY WITH SUBACROMIAL DECOMPRESSION, DEBRIDEMENT AND PARTIAL ROTATOR CUFF REPAIR;  Surgeon: Orvan Blanch, MD;  Location: [facility] ORS;  Service: Orthopedics;  Laterality: Left;   SHOULDER ARTHROSCOPY WITH SUBACROMIAL DECOMPRESSION Right 08/28/2021   Procedure:  SHOULDER ARTHROSCOPY WITH SUBACROMIAL DECOMPRESSION AND DEBRIDEMENT;  Surgeon: Orvan Blanch, MD;  Location: [facility] ORS;  Service: Orthopedics;  Laterality: Right;   SPINAL CORD STIMULATOR INSERTION N/A 03/30/2023   Procedure: SPINAL CORD STIMULATOR INSERTION;  Surgeon: Mort Ards, MD;  Location: [facility] OR;  Service: Orthopedics;  Laterality: N/A;  3 C-Bed   TOTAL KNEE ARTHROPLASTY Left 04/04/2019   Procedure: TOTAL KNEE ARTHROPLASTY;  Surgeon: Liliane Rei, MD;  Location: [facility] ORS;  Service: Orthopedics;  Laterality: Left;    TOTAL KNEE ARTHROPLASTY Right 04/30/2020   Procedure: TOTAL KNEE ARTHROPLASTY;  Surgeon: Liliane Rei, MD;  Location: [facility] OR[facility];  Service: Orthopedics;  Laterality: Right;    TUBAL LIGATION      Allergies  Allergen Reactions   Mikal Alcide ] Hives and Shortness Of Breath    Chest tightness    Penicillins Hives    Has patient had a PCN reaction causing immediate rash, facial/tongue/throat swelling, SOB or lightheadedness with hypotension: Unknown Has patient had a PCN reaction causing severe rash involving mucus membranes or skin necrosis: Unknown Has patient had a PCN reaction that required hospitalization: Unknown Has patient had a PCN reaction occurring within the last 10 years: No If all of the above answers are "NO", then may proceed with Cephalosporin use.  Tolerated Cephalosporin Date: 05/01/20.     Sulfa Antibiotics     G6PD deficiency     Immunization History  Administered Date(s) Administered   Influenza Split 03/23/2014   Influenza,inj,Quad PF,6+ Mos 03/15/2016, 06/08/2017, 03/08/2018, 02/17/2019, 03/05/2021, 03/18/2022   Influenza-Unspecified 04/03/2020   Moderna Sars-Covid-2 Vaccination 09/20/2019, 10/06/2019   PFIZER Comirnaty(Gray Top)Covid-19 Tri-Sucrose Vaccine 11/05/2019, 05/30/2020   PFIZER(Purple Top)SARS-COV-2 Vaccination 10/06/2019, 11/05/2019   Pfizer Covid-19 Vaccine Bivalent Booster 69yrs & up 04/03/2021    Pneumococcal-Unspecified 08/23/2014    Family History  Problem Relation Age of Onset   Sarcoidosis Mother    Lupus Sister    Healthy Daughter    Healthy Son    Healthy Son      Current Outpatient Medications:    albuterol  (VENTOLIN  HFA) 108 (90 Base) MCG/ACT inhaler, Inhale 2 puffs into the lungs every 6 (six) hours as needed for wheezing or shortness of breath., Disp: 1 each, Rfl: 5   amitriptyline  (ELAVIL ) 100 MG tablet, Take 100 mg by mouth at bedtime., Disp: , Rfl:    BREZTRI  AEROSPHERE 160-9-4.8 MCG/ACT AERO inhaler, INHALE 2 PUFFS INTO LUNGS IN THE MORNING AND AT BEDTIME, Disp: 11 g, Rfl: 0   buPROPion  (WELLBUTRIN  XL) 300 MG 24 hr tablet, Take 300 mg by mouth daily., Disp: , Rfl:    busPIRone  (BUSPAR ) 15 MG tablet, Take 15 mg by mouth daily., Disp: , Rfl:    cetirizine  (ZYRTEC ) 10 MG tablet, TAKE 1 TABLET BY MOUTH AT BEDTIME, Disp: 30 tablet, Rfl: 11   fluticasone  (FLONASE ) 50 MCG/ACT nasal spray, Place 1 spray into both nostrils daily., Disp: 15.8 mL, Rfl: 0   hydroxychloroquine  (PLAQUENIL ) 200 MG tablet, Take 400 mg by mouth at bedtime., Disp: , Rfl:    ibuprofen (ADVIL) 200 MG tablet, Take 200 mg by mouth every 6 (six)  hours as needed., Disp: , Rfl:    lisinopril  (ZESTRIL ) 10 MG tablet, Take 10 mg by mouth daily., Disp: , Rfl:    Magnesium  250 MG CAPS, Take by mouth., Disp: , Rfl:    metoprolol  succinate (TOPROL -XL) 25 MG 24 hr tablet, Take 25 mg by mouth daily., Disp: , Rfl:    montelukast  (SINGULAIR ) 10 MG tablet, TAKE 1 TABLET BY MOUTH AT BEDTIME, Disp: 30 tablet, Rfl: 0   mycophenolate  (CELLCEPT ) 250 MG capsule, Take 500 mg by mouth 2 (two) times daily., Disp: , Rfl:    MYRBETRIQ  25 MG TB24 tablet, Take 25 mg by mouth daily., Disp: , Rfl:    omeprazole (PRILOSEC) 20 MG capsule, Take 20 mg by mouth daily., Disp: , Rfl:    Oxycodone  HCl 10 MG TABS, Take 10 mg by mouth 4 (four) times daily., Disp: , Rfl:    QUEtiapine  (SEROQUEL  XR) 300 MG 24 hr tablet, Take 300 mg by mouth at  bedtime., Disp: , Rfl:    sertraline  (ZOLOFT ) 100 MG tablet, Take 200 mg by mouth at bedtime., Disp: , Rfl: 0   VEOZAH 45 MG TABS, Take 1 tablet by mouth daily., Disp: , Rfl:    furosemide  (LASIX ) 20 MG tablet, Take 1 tablet (20 mg total) by mouth daily for 3 days. (Patient not taking: Reported on 12/01/2023), Disp: 3 tablet, Rfl: 0   metFORMIN  (GLUCOPHAGE ) 500 MG tablet, Take 1 tablet (500 mg total) by mouth daily with breakfast. (Patient not taking: Reported on 12/01/2023), Disp: 30 tablet, Rfl: 0   Vitamin D , Ergocalciferol , (DRISDOL ) 1.25 MG (50000 UNIT) CAPS capsule, Take 1 capsule (50,000 Units total) by mouth every 7 (seven) days. (Patient not taking: Reported on 12/01/2023), Disp: 4 capsule, Rfl: 0      Objective:   Vitals:   12/01/23 1129  BP: 120/80  Pulse: (!) 102  SpO2: 98%  Weight: (!) 307 lb (139.3 kg)  Height: 5\' 11"  (1.803 m)    Estimated body mass index is 42.82 kg/m as calculated from the following:   Height as of this encounter: 5\' 11"  (1.803 m).   Weight as of this encounter: 307 lb (139.3 kg).  @WEIGHTCHANGE @  [employer]   12/01/23 1129  Weight: (!) 307 lb (139.3 kg)     Physical Exam   General: No distress. Obese. tall O2 at rest: no Cane present: no Sitting in wheel chair: no Frail: no Obese: yes Neuro: Alert and Oriented x 3. GCS 15. Speech normal Psych: Pleasant Resp:  Barrel Chest - no.  Wheeze - no, Crackles - YES BASE, No overt respiratory distress CVS: Normal heart sounds. Murmurs - no Ext: Stigmata of Connective Tissue Disease - no HEENT: Normal upper airway. PEERL +. No post nasal drip        Assessment:       ICD-10-CM   1. Interstitial lung disease due to connective tissue disease (HCC)  J84.89 Pulmonary function test   M35.9     2. Preop respiratory exam  Z01.811 Pulmonary function test         Plan:     Patient Instructions  Interstitial lung disease due to connective tissue disease (HCC) High risk medication  use Immunosuppressed status  Pulmonary function test is showing/suggesting continued decline. Although this is not clear 1 where the other on the CT scan of the chest. Currently noticed priority is for her to get back surgery   Plan ' -Continue CellCept  through the rheumatologist at [facility] -Continue  Plaquenil  through the rheumatologist at [facility] -Noted that you are off prednisone  since 2021  -If pulmonary fibrosis gets worse then we can add nintedanib -Get spirometry and DLCO in 3 months     History of snoring Witnessed apneic spells OSA    - glad you are seeing Dr Deanna Expose and are on CPAP  Plan  - per Dr Deanna Expose   Preoperative respiratory exam- NE ISSUEs  - Low-moderate risk for complications from back surgery -Risks include pulmonary fibrosis flareup and blood clots  Plan - Routine postoperative recovery programs   Follow-up -Return to see Dr. Bertrum Brodie 15-minute ILD slot in 3-4 months but after PFT, Spiro and dlco  - symptoms score and walk test in 4 months   FOLLOWUP Return in about 3 months (around 03/02/2024) for 15 min visit, with Dr Bertrum Brodie, after Spiro and DLCO, Face to Face Visit.    SIGNATURE    Dr. Maire Scot, M.D., F.C.C.P,  Pulmonary and Critical Care Medicine Staff Physician, [facility] Center Director - Interstitial Lung Disease  Program  Pulmonary Fibrosis [facility] at [facility] [place], [place], [address]  Pager: [phone], If no answer or between  15:00h - 7:00h: call [phone] Telephone: [phone]  11:59 AM 12/01/2023

## 2023-12-06 DIAGNOSIS — M47816 Spondylosis without myelopathy or radiculopathy, lumbar region: Secondary | ICD-10-CM | POA: Diagnosis not present

## 2023-12-09 ENCOUNTER — Ambulatory Visit (HOSPITAL_COMMUNITY): Payer: Self-pay | Admitting: Physician Assistant

## 2023-12-10 NOTE — Progress Notes (Addendum)
 Surgical Instructions  PLEASE BRING YOUR REMOTE FOR YOUR SPINAL CORD STIMULATOR WITH YOU.    Your procedure is scheduled on Monday December 21, 2023. Report to [facility] Main Entrance A at 1000 A.M., then check in with the Admitting office. Any questions or running late day of surgery: call [phone]  Questions prior to your surgery date: call [phone], Monday-Friday, 8am-4pm. If you experience any cold or flu symptoms such as cough, fever, chills, shortness of breath, etc. between now and your scheduled surgery, please notify us  at the above number.     Remember:  Do not eat after midnight the night before your surgery   You may drink clear liquids until 9:00 the morning of your surgery.   Clear liquids allowed are: Water , Non-Citrus Juices (without pulp), Carbonated Beverages, Clear Tea (no milk, honey, etc.), Black Coffee Only (NO MILK, CREAM OR POWDERED CREAMER of any kind), and Gatorade.    Take these medicines the morning of surgery with A SIP OF WATER   BREZTRI  AEROSPHERE 160-9-4.8 MCG/ACT AERO inhaler  buPROPion  (WELLBUTRIN  XL)  busPIRone  (BUSPAR )  metoprolol  succinate (TOPROL -XL)  mycophenolate  (CELLCEPT )  MYRBETRIQ   omeprazole (PRILOSEC)  Oxycodone   VEOZAH   May take these medicines IF NEEDED: albuterol  (VENTOLIN  HFA) 108 (90 Base) MCG/ACT inhaler  Please bring with you to the hospital fluticasone  (FLONASE )   DO NOT TAKE YOUR metFORMIN  (GLUCOPHAGE ) THE MORNING OF SURGERY.    One week prior to surgery, STOP taking any Aspirin  (unless otherwise instructed by your surgeon) Aleve, Naproxen, Ibuprofen, Motrin, Advil, Goody's, BC's, all herbal medications, fish oil, and non-prescription vitamins.                     Do NOT Smoke (Tobacco/Vaping) for 24 hours prior to your procedure.  If you use a CPAP at night, you may bring your mask/headgear for your overnight stay.   You will be asked to remove any contacts, glasses, piercing's, hearing aid's,  dentures/partials prior to surgery. Please bring cases for these items if needed.    Patients discharged the day of surgery will not be allowed to drive home, and someone needs to stay with them for 24 hours.  SURGICAL WAITING ROOM VISITATION Patients may have no more than 2 support people in the waiting area - these visitors may rotate.   Pre-op  nurse will coordinate an appropriate time for 1 ADULT support person, who may not rotate, to accompany patient in pre-op .  Children under the age of 8 must have an adult with them who is not the patient and must remain in the main waiting area with an adult.  If the patient needs to stay at the hospital during part of their recovery, the visitor guidelines for inpatient rooms apply.  Please refer to the [facility] website for the visitor guidelines for any additional information.   If you received a COVID test during your pre-op  visit  it is requested that you wear a mask when out in public, stay away from anyone that may not be feeling well and notify your surgeon if you develop symptoms. If you have been in contact with anyone that has tested positive in the last 10 days please notify you surgeon.      Pre-operative CHG Bathing Instructions   You can play a key role in reducing the risk of infection after surgery. Your skin needs to be as free of germs as possible. You can reduce the number of germs on your skin by washing with  CHG (chlorhexidine  gluconate) soap before surgery. CHG is an antiseptic soap that kills germs and continues to kill germs even after washing.   DO NOT use if you have an allergy to chlorhexidine /CHG or antibacterial soaps. If your skin becomes reddened or irritated, stop using the CHG and notify one of our RNs at [phone].              TAKE A SHOWER THE NIGHT BEFORE SURGERY AND THE DAY OF SURGERY    Please keep in mind the following:  DO NOT shave, including legs and underarms, 48 hours prior to surgery.   You may  shave your face before/day of surgery.  Place clean sheets on your bed the night before surgery Use a clean washcloth (not used since being washed) for each shower. DO NOT sleep with pet's night before surgery.  CHG Shower Instructions:  Wash your face and private area with normal soap. If you choose to wash your hair, wash first with your normal shampoo.  After you use shampoo/soap, rinse your hair and body thoroughly to remove shampoo/soap residue.  Turn the water  OFF and apply half the bottle of CHG soap to a CLEAN washcloth.  Apply CHG soap ONLY FROM YOUR NECK DOWN TO YOUR TOES (washing for 3-5 minutes)  DO NOT use CHG soap on face, private areas, open wounds, or sores.  Pay special attention to the area where your surgery is being performed.  If you are having back surgery, having someone wash your back for you may be helpful. Wait 2 minutes after CHG soap is applied, then you may rinse off the CHG soap.  Pat dry with a clean towel  Put on clean pajamas    Additional instructions for the day of surgery: DO NOT APPLY any lotions, deodorants, cologne, or perfumes.   Do not wear jewelry or makeup Do not wear nail polish, gel polish, artificial nails, or any other type of covering on natural nails (fingers and toes) Do not bring valuables to the hospital. [facility] is not responsible for valuables/personal belongings. Put on clean/comfortable clothes.  Please brush your teeth.  Ask your nurse before applying any prescription medications to the skin.

## 2023-12-11 ENCOUNTER — Encounter (HOSPITAL_COMMUNITY)
Admission: RE | Admit: 2023-12-11 | Discharge: 2023-12-11 | Disposition: A | Discharge status: Home / Self Care | Source: Ambulatory Visit | Attending: Orthopedic Surgery | Admitting: Orthopedic Surgery

## 2023-12-11 ENCOUNTER — Other Ambulatory Visit: Payer: Self-pay

## 2023-12-11 ENCOUNTER — Encounter (HOSPITAL_COMMUNITY): Payer: Self-pay

## 2023-12-11 VITALS — HR 102 | Temp 98.1°F | Resp 18 | Ht 71.0 in | Wt 302.0 lb

## 2023-12-11 DIAGNOSIS — I110 Hypertensive heart disease with heart failure: Secondary | ICD-10-CM | POA: Diagnosis not present

## 2023-12-11 DIAGNOSIS — I700 Atherosclerosis of aorta: Secondary | ICD-10-CM | POA: Insufficient documentation

## 2023-12-11 DIAGNOSIS — J849 Interstitial pulmonary disease, unspecified: Secondary | ICD-10-CM | POA: Insufficient documentation

## 2023-12-11 DIAGNOSIS — I5022 Chronic systolic (congestive) heart failure: Secondary | ICD-10-CM | POA: Insufficient documentation

## 2023-12-11 DIAGNOSIS — Z01818 Encounter for other preprocedural examination: Secondary | ICD-10-CM

## 2023-12-11 DIAGNOSIS — I428 Other cardiomyopathies: Secondary | ICD-10-CM | POA: Diagnosis not present

## 2023-12-11 DIAGNOSIS — D649 Anemia, unspecified: Secondary | ICD-10-CM | POA: Diagnosis not present

## 2023-12-11 DIAGNOSIS — Z01812 Encounter for preprocedural laboratory examination: Secondary | ICD-10-CM | POA: Insufficient documentation

## 2023-12-11 LAB — BASIC METABOLIC PANEL WITH GFR
Anion gap: 8 (ref 5–15)
BUN: 9 mg/dL (ref 6–20)
CO2: 23 mmol/L (ref 22–32)
Calcium: 9 mg/dL (ref 8.9–10.3)
Chloride: 108 mmol/L (ref 98–111)
Creatinine, Ser: 0.93 mg/dL (ref 0.44–1.00)
GFR, Estimated: >60 mL/min (ref >60)
Glucose, Bld: 100 mg/dL — ABNORMAL HIGH (ref 70–99)
Potassium: 3.6 mmol/L (ref 3.5–5.1)
Sodium: 139 mmol/L (ref 135–145)

## 2023-12-11 LAB — CBC
HCT: 37.7 % (ref 36.0–46.0)
Hemoglobin: 11.8 g/dL — ABNORMAL LOW (ref 12.0–15.0)
MCH: 28.7 pg (ref 26.0–34.0)
MCHC: 31.3 g/dL (ref 30.0–36.0)
MCV: 91.7 fL (ref 80.0–100.0)
Platelets: 257 10*3/uL (ref 150–400)
RBC: 4.11 MIL/uL (ref 3.87–5.11)
RDW: 13.7 % (ref 11.5–15.5)
WBC: 3.7 10*3/uL — ABNORMAL LOW (ref 4.0–10.5)
nRBC: 0 % (ref 0.0–0.2)

## 2023-12-11 NOTE — Progress Notes (Signed)
 PCP - Clydene Darner, PA Cardiologist - Dr. Mervyn Ace, [facility], LOV 09/30/2023 Pulmonology: Dr. Maire Scot  PPM/ICD - denies Device Orders - na Rep Notified - na  Chest x-ray - 10/21/2023 EKG - 10/21/2023 Stress Test - 2016 ECHO - 07/15/2017 Cardiac Cath - 08/20/2017  Sleep Study - Diagnosed with sleep apnea with nightly CPAP  Non-diabetic  Blood Thinner Instructions: denies Aspirin  Instructions:denies  ERAS Protcol - Clears until 0900  Anesthesia review: Yes. HTN, ILD, lupus, cardiomyopathy, sleep apnea  Patient denies shortness of breath, fever, cough and chest pain at PAT appointment   All instructions explained to the patient, with a verbal understanding of the material. Patient agrees to go over the instructions while at home for a better understanding. Patient also instructed to self quarantine after being tested for COVID-19. The opportunity to ask questions was provided.

## 2023-12-14 NOTE — Progress Notes (Signed)
 Anesthesia Chart Review:  53 year old female follows with cardiology at [facility] for history of nonischemic cardiomyopathy/HFmrEF, HTN, palpitations.  Cardiac MRI 12/20/2020 showed EF 51% with no wall motion abnormalities, normal RV function, no significant valvular abnormalities.  Coronary CTA 09/18/2020 showed no significant plaque or stenosis.  Last seen in follow-up by Dr. Hershal on 09/30/2023.  Stable from cardiac standpoint, no changes to management, 1 year follow-up recommended.   Follows with rheumatology at [facility] for history of SLE with lung involvement.  She is maintained on mycophenolate  and hydroxychloroquine .   Follows with pulmonology at [facility] for history of ILD associated with SLE and OSA on CPAP.  She is maintained on Breztri  Aerosphere.  Last seen by Dr. Geronimo on 12/01/2023.  PFTs at that time showed FVC 40%, FEV1 53%, ratio 88%, DLCO 57%.  Upcoming surgery discussed.  Per note, low-moderate risk for complications from back surgery.  Risks include pulmonary fibrosis flareup and blood clots.  Pertinent history includes GERD on PPI and H2 blocker, G6PD deficiency.   Preop labs reviewed, mild anemia with hemoglobin 11.8, otherwise unremarkable.   EKG 10/21/2023: Sinus tachycardia. Rate 108. Consider right atrial enlargement. Incomplete right bundle branch block. Prolonged QT interval (QTcB 513).   HRCT chest 11/10/2023: IMPRESSION: 1. Image quality is degraded by expiratory phase imaging and respiratory motion. Pulmonary parenchymal pattern of basilar predominant interstitial lung disease, as detailed above, likely similar to 10/21/2023. Findings are categorized as probable UIP per consensus guidelines: Diagnosis of Idiopathic Pulmonary Fibrosis: An Official ATS/ERS/JRS/ALAT Clinical Practice Guideline. Am JINNY Honey Crit Care Med Vol 198, Iss 5, [phone], Feb 21 2017. 2.  Aortic atherosclerosis (ICD10-I70.0).  Cardiac MRI 12/20/2020 (Care Everywhere): 1.  The left ventricle is normal  in cavity size. There is mild concentric LV hypertrophy. Global systolic  function is low-normal to mildly reduced with an LV ejection fraction calculated at 51%. There are no  regional wall motion abnormalities.   2.  The right ventricle is normal in cavity size, wall thickness, and systolic function.   3.  The left atrium is top-normal in size. The right atrium is normal in size.   4. The aortic valve is trileaflet in morphology. There is no significant aortic valve stenosis or  regurgitation. There is no significant valvular disease.   5. Delayed enhancement imaging demonstrates no evidence of myocardial infarction, scar or infiltrative  disease.   6. No intracardiac thrombus visualized.   7. The pericardium is normal in thickness. There is no significant pericardial effusion.    Coronary CTA 09/18/2020 (Care Everywhere): Impression:   1. No significant plaque or stenosis, although evaluation is somewhat  limited due to diffuse image noise related to patient body habitus and  motion artifact.  2. CTFFR Analysis: CT FFR will not be performed for this study  3.  No coronary artery calcifications with total Agatston Calcium Score of  0 corresponding to the 50th percentile for patient age and gender.   CAD-RADS* 0.    TTE 09/10/2020 (Care Everywhere): INTERPRETATION  MILD LV SYSTOLIC DYSFUNCTION (LVEF 45-50%) WITH LATERAL AND POSTERIOR WALL MOTION  ABNORMALITY IN A LCX DISTRIBUTION  NO SIGNIFICANT VALVULAR DYSFUNCTION  NORMAL RV SIZE AND FUNCTION  NO INTRACARDIAC SOURCE OF MURMUR WAS IDENTIFIED, ALTHOUGH IMAGING OF THE AORTIC VALVE IS  SUB-OPTIMAL  IV ECHO CONTRAST USED TO ENHANCE ENDOCARDIAL BORDERS  NO PRIOR ECHO FOR COMPARISON       Lynwood Geofm RIGGERS [facility] Short Stay Center/Anesthesiology Phone [phone] 12/14/2023 1:01 PM

## 2023-12-14 NOTE — Anesthesia Preprocedure Evaluation (Addendum)
 Anesthesia Evaluation  Patient identified by MRN, date of birth, ID band Patient awake    Reviewed: Allergy & Precautions, NPO status , Patient's Chart, lab work & pertinent test results  History of Anesthesia Complications Negative for: history of anesthetic complications  Airway Mallampati: III  TM Distance: >3 FB Neck ROM: Full    Dental  (+) Edentulous Upper, Edentulous Lower, Dental Advisory Given   Pulmonary shortness of breath, asthma , sleep apnea and Continuous Positive Airway Pressure Ventilation , former smoker   breath sounds clear to auscultation       Cardiovascular hypertension, Pt. on medications (-) angina (-) Past MI and (-) CHF  Rhythm:Regular  Left ventricle: The cavity size was normal. Wall thickness was    normal. Systolic function was normal. The estimated ejection    fraction was in the range of 55% to 60%. Wall motion was normal;    there were no regional wall motion abnormalities. Features are    consistent with a pseudonormal left ventricular filling pattern,    with concomitant abnormal relaxation and increased filling    pressure (grade 2 diastolic dysfunction).  - Mitral valve: There was mild regurgitation.  - Left atrium: The atrium was mildly dilated.  - Right atrium: The atrium was mildly dilated.  - Pulmonary arteries: Systolic pressure was mildly increased. PA    peak pressure: 35 mm Hg (S).     Neuro/Psych  PSYCHIATRIC DISORDERS Anxiety Depression     Neuromuscular disease    GI/Hepatic Neg liver ROS,GERD  ,,  Endo/Other    Class 3 obesity  Renal/GU negative Renal ROS     Musculoskeletal  (+) Arthritis ,    Abdominal  (+) + obese  Peds  Hematology  (+) Blood dyscrasia, anemia Lab Results      Component                Value               Date                      WBC                      5.5                 03/20/2023                HGB                      11.1 (L)             03/20/2023                HCT                      34.4 (L)            03/20/2023                MCV                      94.2                03/20/2023                PLT                      247  03/20/2023              Anesthesia Other Findings   Reproductive/Obstetrics                             Anesthesia Physical Anesthesia Plan  ASA: 3  Anesthesia Plan: General   Post-op Pain Management:    Induction: Intravenous  PONV Risk Score and Plan: 3 and Ondansetron , Dexamethasone , Midazolam  and Treatment may vary due to age or medical condition  Airway Management Planned: Oral ETT  Additional Equipment: None  Intra-op Plan:   Post-operative Plan: Extubation in OR  Informed Consent: I have reviewed the patients History and Physical, chart, labs and discussed the procedure including the risks, benefits and alternatives for the proposed anesthesia with the patient or authorized representative who has indicated his/her understanding and acceptance.     Dental advisory given  Plan Discussed with: CRNA  Anesthesia Plan Comments: (PAT note by Lynwood Hope, PA-C:  53 year old female follows with cardiology at [facility] for history of nonischemic cardiomyopathy/HFmrEF, HTN, palpitations.  Cardiac MRI 12/20/2020 showed EF 51% with no wall motion abnormalities, normal RV function, no significant valvular abnormalities.  Coronary CTA 09/18/2020 showed no significant plaque or stenosis.  Last seen in follow-up by Dr. Hershal on 09/30/2023.  Stable from cardiac standpoint, no changes to management, 1 year follow-up recommended.  Follows with rheumatology at [facility] for history of SLE with lung involvement.  She is maintained on mycophenolate  and hydroxychloroquine .  Follows with pulmonology at [facility] for history of ILD associated with SLE and OSA on CPAP.  She is maintained on Breztri  Aerosphere.  Last seen by Dr. Geronimo on 12/01/2023.  PFTs at that time  showed FVC 40%, FEV1 53%, ratio 88%, DLCO 57%.  Upcoming surgery discussed.  Per note, low-moderate risk for complications from back surgery.  Risks include pulmonary fibrosis flareup and blood clots.  Pertinent history includes GERD on PPI and H2 blocker, G6PD deficiency.  Preop labs reviewed, mild anemia with hemoglobin 11.8, otherwise unremarkable.  EKG 10/21/2023: Sinus tachycardia. Rate 108. Consider right atrial enlargement. Incomplete right bundle branch block. Prolonged QT interval (QTcB 513).  HRCT chest 11/10/2023: IMPRESSION: 1. Image quality is degraded by expiratory phase imaging and respiratory motion. Pulmonary parenchymal pattern of basilar predominant interstitial lung disease, as detailed above, likely similar to 10/21/2023. Findings are categorized as probable UIP per consensus guidelines: Diagnosis of Idiopathic Pulmonary Fibrosis: An Official ATS/ERS/JRS/ALAT Clinical Practice Guideline. Am JINNY Honey Crit Care Med Vol 198, Iss 5, [phone], Feb 21 2017. 2.  Aortic atherosclerosis (ICD10-I70.0).  Cardiac MRI 12/20/2020 (Care Everywhere): 1. The left ventricle is normal in cavity size. There is mild concentric LV hypertrophy. Global systolic  function is low-normal to mildly reduced with an LV ejection fraction calculated at 51%. There are no  regional wall motion abnormalities.   2. The right ventricle is normal in cavity size, wall thickness, and systolic function.   3. The left atrium is top-normal in size. The right atrium is normal in size.   4. The aortic valve is trileaflet in morphology. There is no significant aortic valve stenosis or  regurgitation. There is no significant valvular disease.   5. Delayed enhancement imaging demonstrates no evidence of myocardial infarction, scar or infiltrative  disease.   6. No intracardiac thrombus visualized.   7. The pericardium is normal in thickness. There is no significant pericardial effusion.   Coronary  CTA 09/18/2020 (Care Everywhere): Impression:  1. No significant plaque or stenosis, although evaluation is somewhat  limited due to diffuse image noise related to patient body habitus and  motion artifact.  2. CTFFR Analysis: CT FFR will not be performed for this study  3. No coronary artery calcifications with total Agatston Calcium Score of  0 corresponding to the 50th percentile for patient age and gender.   CAD-RADS* 0.   TTE 09/10/2020 (Care Everywhere): INTERPRETATION  MILD LV SYSTOLIC DYSFUNCTION (LVEF 45-50%) WITH LATERAL AND POSTERIOR WALL MOTION  ABNORMALITY IN A LCX DISTRIBUTION  NO SIGNIFICANT VALVULAR DYSFUNCTION  NORMAL RV SIZE AND FUNCTION  NO INTRACARDIAC SOURCE OF MURMUR WAS IDENTIFIED, ALTHOUGH IMAGING OF THE AORTIC VALVE IS  SUB-OPTIMAL  IV ECHO CONTRAST USED TO ENHANCE ENDOCARDIAL BORDERS  NO PRIOR ECHO FOR COMPARISON   )        Anesthesia Quick Evaluation

## 2023-12-18 NOTE — Progress Notes (Signed)
 Patient made aware of surgical time changeon 12/21/2023. Patient states she is unable to make arrival time of 0530 and needs to keep oringnal time of arrival for 1000. OR made aware.

## 2023-12-20 ENCOUNTER — Other Ambulatory Visit: Payer: Self-pay | Admitting: Internal Medicine

## 2023-12-21 ENCOUNTER — Other Ambulatory Visit: Payer: Self-pay

## 2023-12-21 ENCOUNTER — Encounter (HOSPITAL_COMMUNITY)
Admission: RE | Disposition: A | Discharge status: Home / Self Care | Payer: Self-pay | Source: Home / Self Care | Attending: Orthopedic Surgery

## 2023-12-21 ENCOUNTER — Ambulatory Visit (HOSPITAL_BASED_OUTPATIENT_CLINIC_OR_DEPARTMENT_OTHER): Payer: Self-pay | Admitting: Anesthesiology

## 2023-12-21 ENCOUNTER — Ambulatory Visit (HOSPITAL_COMMUNITY): Payer: Self-pay | Admitting: Physician Assistant

## 2023-12-21 ENCOUNTER — Ambulatory Visit (HOSPITAL_COMMUNITY)
Admission: RE | Admit: 2023-12-21 | Discharge: 2023-12-22 | Disposition: A | Discharge status: Home / Self Care | Attending: Orthopedic Surgery | Admitting: Orthopedic Surgery

## 2023-12-21 ENCOUNTER — Encounter (HOSPITAL_COMMUNITY): Payer: Self-pay | Admitting: Orthopedic Surgery

## 2023-12-21 DIAGNOSIS — I10 Essential (primary) hypertension: Secondary | ICD-10-CM | POA: Diagnosis not present

## 2023-12-21 DIAGNOSIS — M1990 Unspecified osteoarthritis, unspecified site: Secondary | ICD-10-CM | POA: Diagnosis not present

## 2023-12-21 DIAGNOSIS — G4730 Sleep apnea, unspecified: Secondary | ICD-10-CM | POA: Diagnosis not present

## 2023-12-21 DIAGNOSIS — Y758 Miscellaneous neurological devices associated with adverse incidents, not elsewhere classified: Secondary | ICD-10-CM | POA: Diagnosis not present

## 2023-12-21 DIAGNOSIS — J45909 Unspecified asthma, uncomplicated: Secondary | ICD-10-CM | POA: Diagnosis not present

## 2023-12-21 DIAGNOSIS — T85123A Displacement of implanted electronic neurostimulator, generator, initial encounter: Secondary | ICD-10-CM | POA: Diagnosis not present

## 2023-12-21 DIAGNOSIS — Z969 Presence of functional implant, unspecified: Secondary | ICD-10-CM

## 2023-12-21 DIAGNOSIS — Z87891 Personal history of nicotine dependence: Secondary | ICD-10-CM | POA: Diagnosis not present

## 2023-12-21 DIAGNOSIS — G709 Myoneural disorder, unspecified: Secondary | ICD-10-CM | POA: Insufficient documentation

## 2023-12-21 DIAGNOSIS — E66813 Obesity, class 3: Secondary | ICD-10-CM | POA: Diagnosis not present

## 2023-12-21 DIAGNOSIS — K219 Gastro-esophageal reflux disease without esophagitis: Secondary | ICD-10-CM | POA: Insufficient documentation

## 2023-12-21 DIAGNOSIS — F419 Anxiety disorder, unspecified: Secondary | ICD-10-CM | POA: Insufficient documentation

## 2023-12-21 DIAGNOSIS — F32A Depression, unspecified: Secondary | ICD-10-CM | POA: Diagnosis not present

## 2023-12-21 DIAGNOSIS — I340 Nonrheumatic mitral (valve) insufficiency: Secondary | ICD-10-CM | POA: Insufficient documentation

## 2023-12-21 DIAGNOSIS — Z6841 Body Mass Index (BMI) 40.0 and over, adult: Secondary | ICD-10-CM | POA: Insufficient documentation

## 2023-12-21 DIAGNOSIS — T85848A Pain due to other internal prosthetic devices, implants and grafts, initial encounter: Secondary | ICD-10-CM

## 2023-12-21 DIAGNOSIS — F418 Other specified anxiety disorders: Secondary | ICD-10-CM

## 2023-12-21 DIAGNOSIS — T85840A Pain due to nervous system prosthetic devices, implants and grafts, initial encounter: Secondary | ICD-10-CM | POA: Insufficient documentation

## 2023-12-21 DIAGNOSIS — Z79899 Other long term (current) drug therapy: Secondary | ICD-10-CM | POA: Insufficient documentation

## 2023-12-21 DIAGNOSIS — M47816 Spondylosis without myelopathy or radiculopathy, lumbar region: Secondary | ICD-10-CM | POA: Diagnosis not present

## 2023-12-21 HISTORY — PX: SPINAL CORD STIMULATOR BATTERY EXCHANGE: SHX6202

## 2023-12-21 SURGERY — SPINAL CORD STIMULATOR BATTERY EXCHANGE
Anesthesia: General

## 2023-12-21 MED ORDER — ONDANSETRON HCL 4 MG/2ML IJ SOLN
INTRAMUSCULAR | Status: DC | PRN
  Administered 2023-12-21: 4 mg via INTRAVENOUS

## 2023-12-21 MED ORDER — DEXAMETHASONE SODIUM PHOSPHATE 10 MG/ML IJ SOLN
INTRAMUSCULAR | Status: AC
  Filled 2023-12-21: qty 1

## 2023-12-21 MED ORDER — 0.9 % SODIUM CHLORIDE (POUR BTL) OPTIME
TOPICAL | Status: DC | PRN
  Administered 2023-12-21: 1000 mL

## 2023-12-21 MED ORDER — SUCCINYLCHOLINE CHLORIDE 200 MG/10ML IV SOSY
PREFILLED_SYRINGE | INTRAVENOUS | Status: DC | PRN
  Administered 2023-12-21: 120 mg via INTRAVENOUS

## 2023-12-21 MED ORDER — BUPIVACAINE-EPINEPHRINE 0.25% -1:200000 IJ SOLN
INTRAMUSCULAR | Status: DC | PRN
  Administered 2023-12-21: 10 mL
  Administered 2023-12-21: 12.5 mL

## 2023-12-21 MED ORDER — OXYCODONE HCL 10 MG PO TABS: 10.0000 mg | ORAL_TABLET | Freq: Four times a day (QID) | ORAL | Status: DC | PRN

## 2023-12-21 MED ORDER — ONDANSETRON HCL 4 MG/2ML IJ SOLN
INTRAMUSCULAR | Status: AC
  Filled 2023-12-21: qty 2

## 2023-12-21 MED ORDER — BUPIVACAINE LIPOSOME 1.3 % IJ SUSP
INTRAMUSCULAR | Status: DC | PRN
  Administered 2023-12-21: 12.5 mL

## 2023-12-21 MED ORDER — HYDROMORPHONE HCL 1 MG/ML IJ SOLN: 0.2500 mg | INTRAMUSCULAR | Status: DC | PRN

## 2023-12-21 MED ORDER — METOPROLOL SUCCINATE ER 25 MG PO TB24
25.0000 mg | ORAL_TABLET | Freq: Every day | ORAL | Status: DC
Start: 2023-12-21 — End: 2023-12-22
  Administered 2023-12-21 – 2023-12-22 (×2): 25 mg via ORAL
  Filled 2023-12-21 (×2): qty 1

## 2023-12-21 MED ORDER — AMISULPRIDE (ANTIEMETIC) 5 MG/2ML IV SOLN: 10.0000 mg | Freq: Once | INTRAVENOUS | Status: DC | PRN

## 2023-12-21 MED ORDER — TRANEXAMIC ACID-NACL 1000-0.7 MG/100ML-% IV SOLN: 1000.0000 mg | INTRAVENOUS | Status: DC

## 2023-12-21 MED ORDER — TRANEXAMIC ACID-NACL 1000-0.7 MG/100ML-% IV SOLN
1000.0000 mg | INTRAVENOUS | Status: AC
  Administered 2023-12-21: 1000 mg via INTRAVENOUS
  Filled 2023-12-21: qty 100

## 2023-12-21 MED ORDER — FENTANYL CITRATE (PF) 250 MCG/5ML IJ SOLN
INTRAMUSCULAR | Status: AC
  Filled 2023-12-21: qty 5

## 2023-12-21 MED ORDER — MIDAZOLAM HCL 2 MG/2ML IJ SOLN
INTRAMUSCULAR | Status: AC
  Filled 2023-12-21: qty 2

## 2023-12-21 MED ORDER — METHOCARBAMOL 1000 MG/10ML IJ SOLN: 500.0000 mg | Freq: Four times a day (QID) | INTRAMUSCULAR | Status: DC | PRN

## 2023-12-21 MED ORDER — BUSPIRONE HCL 5 MG PO TABS
15.0000 mg | ORAL_TABLET | Freq: Every day | ORAL | Status: DC
  Administered 2023-12-22: 15 mg via ORAL
  Filled 2023-12-21: qty 1

## 2023-12-21 MED ORDER — MIDAZOLAM HCL 2 MG/2ML IJ SOLN
INTRAMUSCULAR | Status: DC | PRN
  Administered 2023-12-21: 2 mg via INTRAVENOUS

## 2023-12-21 MED ORDER — CHLORHEXIDINE GLUCONATE 0.12 % MT SOLN
15.0000 mL | Freq: Once | OROMUCOSAL | Status: AC
  Administered 2023-12-21: 15 mL via OROMUCOSAL
  Filled 2023-12-21: qty 15

## 2023-12-21 MED ORDER — CEFAZOLIN SODIUM-DEXTROSE 3-4 GM/150ML-% IV SOLN: 3.0000 g | INTRAVENOUS | Status: DC

## 2023-12-21 MED ORDER — CEFAZOLIN SODIUM-DEXTROSE 3-4 GM/150ML-% IV SOLN
3.0000 g | INTRAVENOUS | Status: AC
  Administered 2023-12-21: 3 g via INTRAVENOUS
  Filled 2023-12-21: qty 150

## 2023-12-21 MED ORDER — METHOCARBAMOL 500 MG PO TABS
500.0000 mg | ORAL_TABLET | Freq: Four times a day (QID) | ORAL | Status: DC | PRN
  Administered 2023-12-21: 500 mg via ORAL
  Filled 2023-12-21: qty 1

## 2023-12-21 MED ORDER — SERTRALINE HCL 100 MG PO TABS
200.0000 mg | ORAL_TABLET | Freq: Every day | ORAL | Status: DC
  Administered 2023-12-21: 200 mg via ORAL
  Filled 2023-12-21: qty 2

## 2023-12-21 MED ORDER — BUDESON-GLYCOPYRROL-FORMOTEROL 160-9-4.8 MCG/ACT IN AERO: 2.0000 | INHALATION_SPRAY | Freq: Two times a day (BID) | RESPIRATORY_TRACT | Status: DC | PRN

## 2023-12-21 MED ORDER — HYDROMORPHONE HCL 1 MG/ML IJ SOLN
INTRAMUSCULAR | Status: AC
  Filled 2023-12-21: qty 0.5

## 2023-12-21 MED ORDER — SODIUM CHLORIDE 0.9% FLUSH
3.0000 mL | Freq: Two times a day (BID) | INTRAVENOUS | Status: DC
  Administered 2023-12-21: 3 mL via INTRAVENOUS

## 2023-12-21 MED ORDER — MYCOPHENOLATE MOFETIL 250 MG PO CAPS
1000.0000 mg | ORAL_CAPSULE | Freq: Two times a day (BID) | ORAL | Status: DC
Start: 2023-12-21 — End: 2023-12-22
  Administered 2023-12-21 – 2023-12-22 (×2): 1000 mg via ORAL
  Filled 2023-12-21 (×2): qty 4

## 2023-12-21 MED ORDER — ROCURONIUM BROMIDE 10 MG/ML (PF) SYRINGE
PREFILLED_SYRINGE | INTRAVENOUS | Status: AC
  Filled 2023-12-21: qty 10

## 2023-12-21 MED ORDER — FENTANYL CITRATE (PF) 100 MCG/2ML IJ SOLN
INTRAMUSCULAR | Status: AC
  Filled 2023-12-21: qty 2

## 2023-12-21 MED ORDER — BUPROPION HCL ER (XL) 150 MG PO TB24
300.0000 mg | ORAL_TABLET | Freq: Every day | ORAL | Status: DC
  Administered 2023-12-22: 300 mg via ORAL
  Filled 2023-12-21: qty 2

## 2023-12-21 MED ORDER — FEZOLINETANT 45 MG PO TABS: 1.0000 | ORAL_TABLET | Freq: Every day | ORAL | Status: DC

## 2023-12-21 MED ORDER — SODIUM CHLORIDE 0.9 % IV SOLN: 12.5000 mg | INTRAVENOUS | Status: DC | PRN

## 2023-12-21 MED ORDER — BUPIVACAINE LIPOSOME 1.3 % IJ SUSP
INTRAMUSCULAR | Status: AC
  Filled 2023-12-21: qty 20

## 2023-12-21 MED ORDER — FLUTICASONE PROPIONATE 50 MCG/ACT NA SUSP: 1.0000 | Freq: Every day | NASAL | Status: DC | PRN

## 2023-12-21 MED ORDER — CLONIDINE HCL 0.1 MG PO TABS
0.1000 mg | ORAL_TABLET | Freq: Every day | ORAL | Status: DC
  Administered 2023-12-21: 0.1 mg via ORAL
  Filled 2023-12-21: qty 1

## 2023-12-21 MED ORDER — ALBUTEROL SULFATE (2.5 MG/3ML) 0.083% IN NEBU: 3.0000 mL | INHALATION_SOLUTION | Freq: Four times a day (QID) | RESPIRATORY_TRACT | Status: DC | PRN

## 2023-12-21 MED ORDER — PHENYLEPHRINE 80 MCG/ML (10ML) SYRINGE FOR IV PUSH (FOR BLOOD PRESSURE SUPPORT)
PREFILLED_SYRINGE | INTRAVENOUS | Status: DC | PRN
  Administered 2023-12-21: 80 ug via INTRAVENOUS
  Administered 2023-12-21: 160 ug via INTRAVENOUS
  Administered 2023-12-21 (×3): 80 ug via INTRAVENOUS

## 2023-12-21 MED ORDER — LISINOPRIL 5 MG PO TABS
10.0000 mg | ORAL_TABLET | Freq: Every day | ORAL | Status: DC
  Administered 2023-12-21 – 2023-12-22 (×2): 10 mg via ORAL
  Filled 2023-12-21 (×2): qty 2

## 2023-12-21 MED ORDER — ACETAMINOPHEN 325 MG PO TABS
650.0000 mg | ORAL_TABLET | ORAL | Status: DC | PRN
  Administered 2023-12-21 – 2023-12-22 (×3): 650 mg via ORAL
  Filled 2023-12-21 (×3): qty 2

## 2023-12-21 MED ORDER — PHENYLEPHRINE 80 MCG/ML (10ML) SYRINGE FOR IV PUSH (FOR BLOOD PRESSURE SUPPORT)
PREFILLED_SYRINGE | INTRAVENOUS | Status: AC
  Filled 2023-12-21: qty 10

## 2023-12-21 MED ORDER — OXYCODONE HCL 5 MG PO TABS: 5.0000 mg | ORAL_TABLET | Freq: Once | ORAL | Status: DC | PRN

## 2023-12-21 MED ORDER — HYDROXYCHLOROQUINE SULFATE 200 MG PO TABS
400.0000 mg | ORAL_TABLET | Freq: Every day | ORAL | Status: DC
  Administered 2023-12-21: 400 mg via ORAL
  Filled 2023-12-21: qty 2

## 2023-12-21 MED ORDER — LACTATED RINGERS IV SOLN: INTRAVENOUS | Status: DC

## 2023-12-21 MED ORDER — MIRABEGRON ER 25 MG PO TB24
25.0000 mg | ORAL_TABLET | Freq: Every day | ORAL | Status: DC
  Administered 2023-12-21 – 2023-12-22 (×2): 25 mg via ORAL
  Filled 2023-12-21 (×2): qty 1

## 2023-12-21 MED ORDER — CEFAZOLIN SODIUM 1 G IJ SOLR
INTRAMUSCULAR | Status: AC
  Filled 2023-12-21: qty 20

## 2023-12-21 MED ORDER — PROPOFOL 10 MG/ML IV BOLUS
INTRAVENOUS | Status: AC
  Filled 2023-12-21: qty 20

## 2023-12-21 MED ORDER — BUPIVACAINE-EPINEPHRINE (PF) 0.25% -1:200000 IJ SOLN
INTRAMUSCULAR | Status: AC
  Filled 2023-12-21: qty 30

## 2023-12-21 MED ORDER — MONTELUKAST SODIUM 10 MG PO TABS
10.0000 mg | ORAL_TABLET | Freq: Every day | ORAL | Status: DC
  Administered 2023-12-21: 10 mg via ORAL
  Filled 2023-12-21: qty 1

## 2023-12-21 MED ORDER — ONDANSETRON HCL 4 MG PO TABS: 4.0000 mg | ORAL_TABLET | Freq: Four times a day (QID) | ORAL | Status: DC | PRN

## 2023-12-21 MED ORDER — CEFAZOLIN SODIUM-DEXTROSE 1-4 GM/50ML-% IV SOLN
1.0000 g | Freq: Three times a day (TID) | INTRAVENOUS | Status: AC
  Administered 2023-12-21 – 2023-12-22 (×2): 1 g via INTRAVENOUS
  Filled 2023-12-21 (×3): qty 50

## 2023-12-21 MED ORDER — DEXAMETHASONE SODIUM PHOSPHATE 10 MG/ML IJ SOLN
INTRAMUSCULAR | Status: DC | PRN
  Administered 2023-12-21: 10 mg via INTRAVENOUS

## 2023-12-21 MED ORDER — AMITRIPTYLINE HCL 50 MG PO TABS
100.0000 mg | ORAL_TABLET | Freq: Every day | ORAL | Status: DC
  Administered 2023-12-21: 100 mg via ORAL
  Filled 2023-12-21: qty 2

## 2023-12-21 MED ORDER — ROCURONIUM BROMIDE 10 MG/ML (PF) SYRINGE
PREFILLED_SYRINGE | INTRAVENOUS | Status: DC | PRN
  Administered 2023-12-21: 30 mg via INTRAVENOUS

## 2023-12-21 MED ORDER — QUETIAPINE FUMARATE ER 300 MG PO TB24
300.0000 mg | ORAL_TABLET | Freq: Every day | ORAL | Status: DC
  Administered 2023-12-21: 300 mg via ORAL
  Filled 2023-12-21: qty 1

## 2023-12-21 MED ORDER — ONDANSETRON HCL 4 MG/2ML IJ SOLN: 4.0000 mg | Freq: Four times a day (QID) | INTRAMUSCULAR | Status: DC | PRN

## 2023-12-21 MED ORDER — FENTANYL CITRATE (PF) 250 MCG/5ML IJ SOLN
INTRAMUSCULAR | Status: DC | PRN
  Administered 2023-12-21: 100 ug via INTRAVENOUS

## 2023-12-21 MED ORDER — SUGAMMADEX SODIUM 200 MG/2ML IV SOLN
INTRAVENOUS | Status: DC | PRN
  Administered 2023-12-21: 300 mg via INTRAVENOUS

## 2023-12-21 MED ORDER — MEPERIDINE HCL 25 MG/ML IJ SOLN: 6.2500 mg | INTRAMUSCULAR | Status: DC | PRN

## 2023-12-21 MED ORDER — SODIUM CHLORIDE 0.9% FLUSH: 3.0000 mL | INTRAVENOUS | Status: DC | PRN

## 2023-12-21 MED ORDER — LIDOCAINE 2% (20 MG/ML) 5 ML SYRINGE
INTRAMUSCULAR | Status: DC | PRN
  Administered 2023-12-21: 100 mg via INTRAVENOUS

## 2023-12-21 MED ORDER — OXYCODONE HCL 5 MG PO TABS
10.0000 mg | ORAL_TABLET | Freq: Four times a day (QID) | ORAL | Status: DC | PRN
  Administered 2023-12-21 – 2023-12-22 (×2): 10 mg via ORAL
  Filled 2023-12-21 (×2): qty 2

## 2023-12-21 MED ORDER — PROPOFOL 10 MG/ML IV BOLUS
INTRAVENOUS | Status: DC | PRN
  Administered 2023-12-21: 150 mg via INTRAVENOUS

## 2023-12-21 MED ORDER — FAMOTIDINE 20 MG PO TABS
20.0000 mg | ORAL_TABLET | Freq: Two times a day (BID) | ORAL | Status: DC
  Administered 2023-12-21 – 2023-12-22 (×2): 20 mg via ORAL
  Filled 2023-12-21 (×2): qty 1

## 2023-12-21 MED ORDER — SODIUM CHLORIDE 0.9 % IV SOLN: 250.0000 mL | INTRAVENOUS | Status: DC

## 2023-12-21 MED ORDER — ORAL CARE MOUTH RINSE: 15.0000 mL | Freq: Once | OROMUCOSAL | Status: AC

## 2023-12-21 MED ORDER — METHOCARBAMOL 500 MG PO TABS: 500.0000 mg | ORAL_TABLET | Freq: Three times a day (TID) | ORAL | 0 refills | Status: AC | PRN

## 2023-12-21 MED ORDER — OXYCODONE HCL 5 MG/5ML PO SOLN: 5.0000 mg | Freq: Once | ORAL | Status: DC | PRN

## 2023-12-21 MED ORDER — ACETAMINOPHEN 650 MG RE SUPP: 650.0000 mg | RECTAL | Status: DC | PRN

## 2023-12-21 MED ORDER — LIDOCAINE 2% (20 MG/ML) 5 ML SYRINGE
INTRAMUSCULAR | Status: AC
  Filled 2023-12-21: qty 5

## 2023-12-21 SURGICAL SUPPLY — 42 items
BAG COUNTER SPONGE SURGICOUNT (BAG) ×1 IMPLANT
CANISTER SUCTION 3000ML PPV (SUCTIONS) ×1 IMPLANT
CLSR STERI-STRIP ANTIMIC 1/2X4 (GAUZE/BANDAGES/DRESSINGS) ×1 IMPLANT
CORD BIPOLAR FORCEPS 12FT (ELECTRODE) ×1 IMPLANT
DRAPE INCISE IOBAN 66X45 STRL (DRAPES) ×1 IMPLANT
DRAPE LAPAROTOMY 100X72 PEDS (DRAPES) ×1 IMPLANT
DRAPE POUCH INSTRU U-SHP 10X18 (DRAPES) ×1 IMPLANT
DRAPE SURG 17X23 STRL (DRAPES) ×1 IMPLANT
DRAPE U-SHAPE 47X51 STRL (DRAPES) ×1 IMPLANT
DRSG AQUACEL AG ADV 3.5X 6 (GAUZE/BANDAGES/DRESSINGS) ×1 IMPLANT
DRSG OPSITE POSTOP 3X4 (GAUZE/BANDAGES/DRESSINGS) IMPLANT
DRSG OPSITE POSTOP 4X6 (GAUZE/BANDAGES/DRESSINGS) ×1 IMPLANT
DURAPREP 26ML APPLICATOR (WOUND CARE) ×1 IMPLANT
ELECT CAUTERY BLADE 6.4 (BLADE) ×1 IMPLANT
ELECT PENCIL ROCKER SW 15FT (MISCELLANEOUS) ×1 IMPLANT
ELECTRODE REM PT RTRN 9FT ADLT (ELECTROSURGICAL) ×1 IMPLANT
GLOVE BIO SURGEON STRL SZ 6.5 (GLOVE) ×1 IMPLANT
GLOVE BIOGEL PI IND STRL 6.5 (GLOVE) ×1 IMPLANT
GLOVE BIOGEL PI IND STRL 8.5 (GLOVE) ×1 IMPLANT
GLOVE SS BIOGEL STRL SZ 8.5 (GLOVE) ×2 IMPLANT
GOWN STRL REUS W/ TWL LRG LVL3 (GOWN DISPOSABLE) ×2 IMPLANT
GOWN STRL REUS W/TWL 2XL LVL3 (GOWN DISPOSABLE) ×1 IMPLANT
KIT BASIN OR (CUSTOM PROCEDURE TRAY) ×1 IMPLANT
KIT TURNOVER KIT B (KITS) ×1 IMPLANT
NDL SUT 6 .5 CRC .975X.05 MAYO (NEEDLE) ×1 IMPLANT
NS IRRIG 1000ML POUR BTL (IV SOLUTION) ×1 IMPLANT
PACK GENERAL/GYN (CUSTOM PROCEDURE TRAY) ×1 IMPLANT
PACK UNIVERSAL I (CUSTOM PROCEDURE TRAY) ×1 IMPLANT
PAD ARMBOARD POSITIONER FOAM (MISCELLANEOUS) ×2 IMPLANT
POUCH TYRX ANTIBAC NEURO MED (Mesh General) IMPLANT
SPONGE T-LAP 4X18 ~~LOC~~+RFID (SPONGE) ×1 IMPLANT
SUT ETHIBOND NAB CT1 #1 30IN (SUTURE) ×2 IMPLANT
SUT MNCRL AB 3-0 PS2 27 (SUTURE) ×2 IMPLANT
SUT VIC AB 1 CT1 18XCR BRD 8 (SUTURE) ×1 IMPLANT
SUT VIC AB 2-0 CT1 18 (SUTURE) ×2 IMPLANT
SYR BULB IRRIG 60ML STRL (SYRINGE) ×1 IMPLANT
TOOL TUNNELING (INSTRUMENTS) IMPLANT
TOOL TUNNELING 12 PRODIGY (MISCELLANEOUS) IMPLANT
TOWEL GREEN STERILE (TOWEL DISPOSABLE) ×1 IMPLANT
TOWEL GREEN STERILE FF (TOWEL DISPOSABLE) ×2 IMPLANT
WATER STERILE IRR 1000ML POUR (IV SOLUTION) ×1 IMPLANT
WRENCH TORQUE (MISCELLANEOUS) IMPLANT

## 2023-12-21 NOTE — Anesthesia Procedure Notes (Addendum)
 Procedure Name: Intubation Date/Time: 12/21/2023 1:04 PM  Performed by: Julien Manus, CRNAPre-anesthesia Checklist: Patient identified, Emergency Drugs available, Suction available and Patient being monitored Patient Re-evaluated:Patient Re-evaluated prior to induction Oxygen  Delivery Method: Circle System Utilized Preoxygenation: Pre-oxygenation with 100% oxygen  Induction Type: IV induction Ventilation: Mask ventilation without difficulty Laryngoscope Size: Glidescope and 4 Tube type: Oral Tube size: 7.5 mm Number of attempts: 1 Airway Equipment and Method: Stylet and Oral airway Placement Confirmation: ETT inserted through vocal cords under direct vision, positive ETCO2 and breath sounds checked- equal and bilateral Secured at: 21 cm Tube secured with: Tape Dental Injury: Teeth and Oropharynx as per pre-operative assessment

## 2023-12-21 NOTE — Brief Op Note (Signed)
 12/21/2023  2:15 PM  PATIENT:  Shannon [name]  53 y.o. female  PRE-OPERATIVE DIAGNOSIS:  SPINAL CORD STIMULATOR MOVEMENT AND PAIN WITH BATTERY SITE  POST-OPERATIVE DIAGNOSIS:  SPINAL CORD STIMULATOR MOVEMENT AND PAIN WITH BATTERY SITE  PROCEDURE:  Procedure(s) with comments: SPINAL CORD STIMULATOR BATTERY EXCHANGE (N/A) - REPOSITION SPINAL CORD STIMULATOR BATTERY  SURGEON:  Surgeons and Role:    DEWAINE Burnetta Aures, MD - Primary  PHYSICIAN ASSISTANT: Jeoffrey Sages  ASSISTANTS:    ANESTHESIA:   general  EBL:  20 mL   BLOOD ADMINISTERED:none  DRAINS: none   LOCAL MEDICATIONS USED:  MARCAINE    & exparel   SPECIMEN:  No Specimen  DISPOSITION OF SPECIMEN:  N/A  COUNTS:  YES  TOURNIQUET:  * No tourniquets in log *  DICTATION: .Dragon Dictation  PLAN OF CARE: Admit for overnight observation  PATIENT DISPOSITION:  PACU - hemodynamically stable.

## 2023-12-21 NOTE — Transfer of Care (Signed)
 Immediate Anesthesia Transfer of Care Note  Patient: Shannon [name]  Procedure(s) Performed: SPINAL CORD STIMULATOR BATTERY EXCHANGE  Patient Location: PACU  Anesthesia Type:General  Level of Consciousness: awake and drowsy  Airway & Oxygen  Therapy: Patient Spontanous Breathing and Patient connected to face mask oxygen   Post-op Assessment: Report given to RN and Post -op Vital signs reviewed and stable  Post vital signs: Reviewed and stable  Last Vitals:  Vitals Value Taken Time  BP 180/99 12/21/23 14:53  Temp    Pulse 103 12/21/23 14:56  Resp 24 12/21/23 14:56  SpO2 86 % 12/21/23 14:56  Vitals shown include unfiled device data.  Last Pain:  Vitals:   12/21/23 1024  TempSrc:   PainSc: 9       Patients Stated Pain Goal: 5 (12/21/23 1024)  Complications: No notable events documented.

## 2023-12-21 NOTE — H&P (Addendum)
 Shannon [name], [name], PA-CThere were no vitals filed for this visit.  Chief Complaint:  History: Shannon [name] is a very pleasant 53 year old man had a spinal cord stimulator placed and has done well.  Recently the battery has rotated and as result the unit is no longer functioning properly and is painful.  As a result we have elected to move forward with repositioning the battery.  Patient is understood the risks and benefits of surgery and expressed desire to move forward.  Past Medical History:  Diagnosis Date   Anemia    Anxiety    Bilateral swelling of feet    Bronchitis    DDD (degenerative disc disease), lumbosacral    Depression    Dilated cardiomyopathy (HCC)    Dyspnea    Sometimes at rest   G6PD deficiency    GERD (gastroesophageal reflux disease)    Hypertension    ILD (interstitial lung disease) (HCC)    Lactose intolerance    Lupus    Osteoarthritis    Palpitations    Pneumonia    Rheumatoid arthritis (HCC)    SLE (systemic lupus erythematosus) (HCC)    Sleep apnea     Allergies  Allergen Reactions   Asa [Aspirin ] Hives and Shortness Of Breath    Chest tightness    Penicillins Hives     Tolerated Cephalosporin Date: 05/01/20.     Sulfa Antibiotics     G6PD deficiency     No current facility-administered medications on file prior to encounter.   Current Outpatient Medications on File Prior to Encounter  Medication Sig Dispense Refill   acetaminophen  (TYLENOL ) 500 MG tablet Take 500 mg by mouth every 6 (six) hours as needed for moderate pain (pain score 4-6).     albuterol  (VENTOLIN  HFA) 108 (90 Base) MCG/ACT inhaler Inhale 2 puffs into the lungs every 6 (six) hours as needed for wheezing or shortness of breath. 1 each 5   amitriptyline  (ELAVIL ) 100 MG tablet Take 100 mg by mouth at bedtime.     BREZTRI  AEROSPHERE 160-9-4.8 MCG/ACT AERO inhaler INHALE 2 PUFFS INTO LUNGS IN THE MORNING AND AT BEDTIME 11 g 0   buPROPion  (WELLBUTRIN  XL) 300 MG 24 hr tablet Take 300  mg by mouth daily.     busPIRone  (BUSPAR ) 15 MG tablet Take 15 mg by mouth daily.     cetirizine  (ZYRTEC ) 10 MG tablet TAKE 1 TABLET BY MOUTH AT BEDTIME 30 tablet 11   cloNIDine  (CATAPRES ) 0.1 MG tablet Take 0.1 mg by mouth at bedtime.     famotidine  (PEPCID ) 20 MG tablet Take 20 mg by mouth 2 (two) times daily.     fluticasone  (FLONASE ) 50 MCG/ACT nasal spray Place 1 spray into both nostrils daily. (Patient taking differently: Place 1 spray into both nostrils daily as needed for rhinitis or allergies.) 15.8 mL 0   hydroxychloroquine  (PLAQUENIL ) 200 MG tablet Take 400 mg by mouth at bedtime.     ibuprofen (ADVIL) 200 MG tablet Take 200-800 mg by mouth every 6 (six) hours as needed for moderate pain (pain score 4-6).     lisinopril  (ZESTRIL ) 10 MG tablet Take 10 mg by mouth daily.     Magnesium  250 MG CAPS Take 1 capsule by mouth daily.     metoprolol  succinate (TOPROL -XL) 25 MG 24 hr tablet Take 25 mg by mouth daily.     mycophenolate  (CELLCEPT ) 500 MG tablet Take 1,000 mg by mouth 2 (two) times daily.     MYRBETRIQ  25  MG TB24 tablet Take 25 mg by mouth daily.     omeprazole (PRILOSEC) 20 MG capsule Take 20 mg by mouth daily.     Oxycodone  HCl 10 MG TABS Take 10 mg by mouth 4 (four) times daily as needed (Pain).     QUEtiapine  (SEROQUEL  XR) 300 MG 24 hr tablet Take 300 mg by mouth at bedtime.     sertraline  (ZOLOFT ) 100 MG tablet Take 200 mg by mouth at bedtime.  0   VEOZAH 45 MG TABS Take 1 tablet by mouth daily.      Physical Exam: Clinical exam: Shannon [name] is a pleasant individual, who appears younger than their stated age.  She is alert and orientated 3.  No shortness of breath, chest pain.  Abdomen is soft and non-tender, negative loss of bowel and bladder control, no rebound tenderness.  Negative: skin lesions abrasions contusions  Peripheral pulses: 2+ peripheral pulses bilaterally. LE compartments are: Soft and nontender.  Gait pattern: Normal  Assistive devices:  None  Neuro: Intermittent dysesthesias and neuropathic pain in lower extremity. No focal motor deficits. Negative straight leg raise test, no clonus, negative Babinski test.  Musculoskeletal: Significant pain and tenderness over palpation of the the spinal cord stimulator battery site. The battery itself is noted to be rotated. Incision sites are well-healed.  Imaging: X-rays of the lumbar spine from April 2025 show the spinal cord stimulator battery has rotated approximately 90 degrees.  A/P:  Shannon [name] is a very pleasant 53 year old woman who had a spinal cord stimulator placed approximately 8 months ago. Patient initially has done quite well and her pain was well-controlled. Over the last 2 to 3 months she has noticed that increasing difficulty charging her battery and there has been worsening pain at the battery site. Imaging shows that the battery is rotated approximately 90 degrees. This would explain why it is more difficult to charge the battery as the ability of the magnet to connect to the battery site is compromised due to the rotational deformity. At this point since it was helping to manage her pain we have elected to reposition the battery. I have gone over the risks and benefits. She is expressed an understanding and willingness to move forward with surgery.  Risks of surgery: Infection, bleeding, nerve damage, damage to the leads which would prohibit reimplantation of the spinal cord stimulator battery. Ongoing or worse pain. Need for additional surgery. Risk of anesthesia: Death, stroke, paralysis.

## 2023-12-21 NOTE — Discharge Instructions (Signed)
 Spinal Cord Stimulator Replacement Care After This sheet gives you information about how to care for yourself after your procedure. Your health care provider may also give you more specific instructions. If you have problems or questions, contact your health care provider. What can I expect after the procedure? After the procedure, it is common to have: Soreness or pain. Some swelling in the area where the hardware was removed. A small amount of blood or clear fluid coming from your incision. Follow these instructions at home: If you have a cast: Do not stick anything inside the cast to scratch your skin. Doing that increases your risk of infection. Check the skin around the cast every day. Tell your health care provider about any concerns. You may put lotion on dry skin around the edges of the cast. Do not put lotion on the skin underneath the cast. Keep the cast clean and dry. Do not take baths, swim, or use a hot tub until your health care provider approves. Ask your health care provider if you may take showers. You may only be allowed to take sponge baths. Keep the bandage (dressing) dry until your health care provider says it can be removed. Ok to shower in 5 days.    Incision care  Follow instructions from your health care provider about how to take care of your incision. Make sure you: Wash your hands with soap and water before you change your dressing. If soap and water are not available, use hand sanitizer. Change your dressing as told by your health care provider. Leave stitches (sutures), skin glue, or adhesive strips in place. These skin closures may need to stay in place for 2 weeks or longer. If adhesive strip edges start to loosen and curl up, you may trim the loose edges. Do not remove adhesive strips completely unless your health care provider tells you to do that. Check your incision area every day for signs of infection. Check for: Redness. More swelling or pain. More  fluid or blood. Warmth. Pus or a bad smell. Managing pain, stiffness, and swelling  If directed, put ice on the affected area: Put ice in a plastic bag. Place a towel between your skin and the bag. Leave the ice on for 20 minutes, 2-3 times a day. Move your fingers or toes often to avoid stiffness and to lessen swelling.  Driving Do not drive or use heavy machinery while taking prescription pain medicine. Do not drive for 24 hours if you were given a medicine to help you relax (sedative) during your procedure. Ask your health care provider when it is safe to drive if you have a cast, splint, or boot on the affected limb. Activity Ask your health care provider what activities are safe for you during recovery, and ask what activities you need to avoid. Do not use the injured limb to support your body weight until your health care provider says that you can. Do not play contact sports until your health care provider approves. Do exercises as told by your health care provider. Avoid sitting for a long time without moving. Get up and move around at least every few hours. This will help prevent blood clots. General instructions Do not put pressure on any part of the cast or splint until it is fully hardened. This may take several hours. If you are taking prescription pain medicine, take actions to prevent or treat constipation. Your health care provider may recommend that you: Drink enough fluid to keep your   urine pale yellow. Eat foods that are high in fiber, such as fresh fruits and vegetables, whole grains, and beans. Limit foods that are high in fat and processed sugars, such as fried or sweet foods. Take an over-the-counter or prescription medicine for constipation. Do not use any products that contain nicotine or tobacco, such as cigarettes and e-cigarettes. These can delay bone healing after surgery. If you need help quitting, ask your health care provider. Take over-the-counter and  prescription medicines only as told by your health care provider. Keep all follow-up visits as told by your health care provider. This is important. Contact a health care provider if: You have lasting pain. You have redness around your incision. You have more swelling or pain around your incision. You have more fluid or blood coming from your incision. Your incision feels warm to the touch. You have pus or a bad smell coming from your incision. You are unable to do exercises or physical activity as told by your health care provider. Get help right away if: You have difficulty breathing. You have chest pain. You have severe pain. You have a fever or chills. You have numbness for more than 24 hours in the area where the hardware was removed. Summary After the procedure, it is common to have some pain and swelling in the area where the hardware was removed. Follow instructions from your health care provider about how to take care of your incision. Return to your normal activities as told by your health care provider. Ask your health care provider what activities are safe for you. This information is not intended to replace advice given to you by your health care provider. Make sure you discuss any questions you have with your health care provider. 

## 2023-12-21 NOTE — Plan of Care (Signed)

## 2023-12-21 NOTE — Op Note (Signed)
 OPERATIVE REPORT  DATE OF SURGERY: 12/21/2023  PATIENT NAME:  Shannon [name] MRN: [id number] DOB: 1970/11/23  PCP: Venancio Pock, PA-C  PRE-OPERATIVE DIAGNOSIS: Malpositioned spinal cord stimulator battery  POST-OPERATIVE DIAGNOSIS: Same  PROCEDURE:   Repositioning of spinal cord stimulator battery  SURGEON:  Donaciano Sprang, MD  PHYSICIAN ASSISTANT: Jeoffrey Sages, PA  ANESTHESIA:   General  EBL: 20 ml   Complications: none  BRIEF HISTORY: BENAY POMEROY is a 53 y.o. female who had a spinal cord stimulator placed approximately 8 months ago.  Approximate 2 months ago she could no longer charge the device.  She was also having increasing pain at the battery site.  Imaging confirmed that the battery had rotated 90 degrees.  As a result of the rotation he could no longer contact the magnet for charging.  As result of the pain and loss of function of the spinal cord stimulator battery we elected to move forward with repositioning the battery.  Risks benefits and alternatives were discussed with the patient and consent was obtained.  PROCEDURE DETAILS: Patient was brought into the operating room and was properly positioned on the operating room table.  After induction with general anesthesia the patient was endotracheally intubated.  A timeout was taken to confirm all important data: including patient, procedure, and the level. Teds, SCD's were applied.   Patient was turned prone onto the Wilson frame and all bony prominences well-padded.  The posterior spine was prepped and draped in a standard fashion.  The original incision site was reincised and I dissected sharply down to the spinal cord stimulator battery.  I then isolated the battery and removed it from the wound.  Both leads were intact.  The battery itself had noted to be rotated and it was quite difficult to rotated back into the proper position.    I then made another incision more distal.  This was marked out in the preoperative  holding area to ensure that the patient could reach behind and touch that area to charge the battery.  Was approximately 10 cm inferior to the first incision.  I created a pocket approximately 2 and half centimeters deep.  I then passed the leads from the original battery site to the new site.  I then connected the leads to the battery and tightened the leads according manufacture standards.  At this point the battery was then placed into the antibiotic pouch.  I then used a #1 Ethibond suture to secure the pouch at all 4 corners to prevent the rotational deformity that had occurred prior.  I also secured the battery through the 2 suture holes.  Has not added layer of protection I sutured the surface of the pouch to the undersurface of the fascia in order to further prevent any rotational malpositioning of the battery.  At this point the battery was quite secure.  I then placed the charging unit on top of the to ensure that it was not placed too deep and that the battery could be charged.  Once we confirmed this I irrigated both wounds copiously with normal saline.  I then closed the wounds in a layered fashion with interrupted #1 Vicryl sutures, followed by interrupted 2-0 Vicryl sutures and finally 3-0 Monocryl for the skin.  Steri-Strips dry dressings were applied and the patient was extubated transferred the PACU without incident.  The end of the case all needle sponge counts were correct.  There were no adverse intraoperative events.  Donaciano Sprang, MD 12/21/2023 2:10  PM

## 2023-12-22 ENCOUNTER — Encounter (HOSPITAL_COMMUNITY): Payer: Self-pay | Admitting: Orthopedic Surgery

## 2023-12-22 ENCOUNTER — Other Ambulatory Visit: Payer: Self-pay | Admitting: Primary Care

## 2023-12-22 DIAGNOSIS — T85840A Pain due to nervous system prosthetic devices, implants and grafts, initial encounter: Secondary | ICD-10-CM | POA: Diagnosis not present

## 2023-12-22 NOTE — Anesthesia Postprocedure Evaluation (Signed)
 Anesthesia Post Note  Patient: Shannon [name]  Procedure(s) Performed: SPINAL CORD STIMULATOR BATTERY EXCHANGE     Patient location during evaluation: PACU Anesthesia Type: General Level of consciousness: awake and alert Pain management: pain level controlled Vital Signs Assessment: post-procedure vital signs reviewed and stable Respiratory status: spontaneous breathing, nonlabored ventilation and respiratory function stable Cardiovascular status: blood pressure returned to baseline and stable Postop Assessment: no apparent nausea or vomiting Anesthetic complications: no   No notable events documented.  Last Vitals:  Vitals:   12/21/23 2300 12/22/23 0304  BP: (!) 142/94 (!) 141/92  Pulse: 86   Resp: 16 20  Temp: 36.6 C 36.6 C  SpO2: 100%     Last Pain:  Vitals:   12/22/23 0325  TempSrc:   PainSc: 2                  Butler Levander Pinal

## 2023-12-22 NOTE — Progress Notes (Signed)
 Patient IV removed by Sinclair PEAK. Patient reports feeling very sleepy this morning and has a headache but otherwise feeling alright. This nurse went over discharge paperwork. Patient ambulated to bathroom with walker to get dressed and then was taken down in wheelchair and safety transferred to car. Husband had brought down patient's personal belongings.

## 2023-12-22 NOTE — Discharge Summary (Signed)
 Patient ID: Shannon [name] MRN: [id number] DOB/AGE: 12-07-1970 53 y.o.  Admit date: 12/21/2023 Discharge date: 12/22/2023  Admission Diagnoses:  Principal Problem:   Retained orthopedic hardware   Discharge Diagnoses:  Principal Problem:   Retained orthopedic hardware  status post Procedure(s): SPINAL CORD STIMULATOR BATTERY EXCHANGE  Past Medical History:  Diagnosis Date   Anemia    Anxiety    Bilateral swelling of feet    Bronchitis    DDD (degenerative disc disease), lumbosacral    Depression    Dilated cardiomyopathy (HCC)    Dyspnea    Sometimes at rest   G6PD deficiency    GERD (gastroesophageal reflux disease)    Hypertension    ILD (interstitial lung disease) (HCC)    Lactose intolerance    Lupus    Osteoarthritis    Palpitations    Pneumonia    Rheumatoid arthritis (HCC)    SLE (systemic lupus erythematosus) (HCC)    Sleep apnea     Surgeries: Procedure(s): SPINAL CORD STIMULATOR BATTERY EXCHANGE on 12/21/2023   Consultants:   Discharged Condition: Improved  Hospital Course: Shannon [name] is an 53 y.o. female who was admitted 12/21/2023 for operative treatment of Retained orthopedic hardware. Patient failed conservative treatments (please see the history and physical for the specifics) and had severe unremitting pain that affects sleep, daily activities and work/hobbies. After pre-op  clearance, the patient was taken to the operating room on 12/21/2023 and underwent  Procedure(s): SPINAL CORD STIMULATOR BATTERY EXCHANGE.    Patient was given perioperative antibiotics:  Anti-infectives (From admission, onward)    Start     Dose/Rate Route Frequency Ordered Stop   12/21/23 2200  hydroxychloroquine  (PLAQUENIL ) tablet 400 mg        400 mg Oral Daily at bedtime 12/21/23 2003     12/21/23 2100  ceFAZolin  (ANCEF ) IVPB 1 g/50 mL premix        1 g 100 mL/hr over 30 Minutes Intravenous Every 8 hours 12/21/23 1645 12/22/23 0551   12/21/23 1009  ceFAZolin   (ANCEF ) IVPB 3g/150 mL premix        3 g 300 mL/hr over 30 Minutes Intravenous 30 min pre-op  12/21/23 1009 12/21/23 1309   12/21/23 1009  ceFAZolin  (ANCEF ) IVPB 3g/150 mL premix  Status:  Discontinued        3 g 300 mL/hr over 30 Minutes Intravenous 30 min pre-op  12/21/23 1009 12/21/23 1010        Patient was given sequential compression devices and early ambulation to prevent DVT.   Patient benefited maximally from hospital stay and there were no complications. At the time of discharge, the patient was urinating/moving their bowels without difficulty, tolerating a regular diet, pain is controlled with oral pain medications and they have been cleared by PT/OT.   Recent vital signs: Patient Vitals for the past 24 hrs:  BP Temp Temp src Pulse Resp SpO2 Height Weight  12/22/23 0304 (!) 141/92 97.8 F (36.6 C) Oral -- 20 -- -- --  12/21/23 2300 (!) 142/94 97.8 F (36.6 C) Oral 86 16 100 % -- --  12/21/23 2201 (!) 160/99 -- -- -- -- -- -- --  12/21/23 2200 (!) 160/99 -- -- 95 -- -- -- --  12/21/23 2004 (!) 149/96 97.9 F (36.6 C) Oral 92 18 100 % -- --  12/21/23 1615 (!) 145/96 (!) 97.5 F (36.4 C) -- 92 13 95 % -- --  12/21/23 1600 (!) 144/99 -- -- 95 18 96 % -- --  12/21/23 1545 (!) 152/104 -- -- 95 13 99 % -- --  12/21/23 1530 (!) 139/94 -- -- 94 14 99 % -- --  12/21/23 1515 (!) 154/99 -- -- 94 (!) 21 97 % -- --  12/21/23 1500 (!) 171/106 -- -- 99 (!) 21 98 % -- --  12/21/23 1453 (!) 180/99 (!) 97.5 F (36.4 C) -- 98 16 98 % -- --  12/21/23 1003 138/87 97.7 F (36.5 C) Oral 95 19 97 % 5' 11 (1.803 m) 132.5 kg     Recent laboratory studies: No results for input(s): WBC, HGB, HCT, PLT, NA, K, CL, CO2, BUN, CREATININE, GLUCOSE, INR, CALCIUM in the last 72 hours.  Invalid input(s): PT, 2   Discharge Medications:   Allergies as of 12/22/2023       Reactions   Asa [aspirin ] Hives, Shortness Of Breath   Chest tightness    Penicillins Hives    Tolerated Cephalosporin Date: 05/01/20.     Sulfa Antibiotics    G6PD deficiency         Medication List     STOP taking these medications    acetaminophen  500 MG tablet Commonly known as: TYLENOL    ibuprofen 200 MG tablet Commonly known as: ADVIL       TAKE these medications    albuterol  108 (90 Base) MCG/ACT inhaler Commonly known as: VENTOLIN  HFA Inhale 2 puffs into the lungs every 6 (six) hours as needed for wheezing or shortness of breath.   amitriptyline  100 MG tablet Commonly known as: ELAVIL  Take 100 mg by mouth at bedtime.   Breztri  Aerosphere 160-9-4.8 MCG/ACT Aero inhaler Generic drug: budesonide -glycopyrrolate-formoterol  INHALE 2 PUFFS INTO LUNGS IN THE MORNING AND AT BEDTIME   buPROPion  300 MG 24 hr tablet Commonly known as: WELLBUTRIN  XL Take 300 mg by mouth daily.   busPIRone  15 MG tablet Commonly known as: BUSPAR  Take 15 mg by mouth daily.   cetirizine  10 MG tablet Commonly known as: ZYRTEC  TAKE 1 TABLET BY MOUTH AT BEDTIME   cloNIDine  0.1 MG tablet Commonly known as: CATAPRES  Take 0.1 mg by mouth at bedtime.   famotidine  20 MG tablet Commonly known as: PEPCID  Take 20 mg by mouth 2 (two) times daily.   fluticasone  50 MCG/ACT nasal spray Commonly known as: FLONASE  Place 1 spray into both nostrils daily. What changed:  when to take this reasons to take this   hydroxychloroquine  200 MG tablet Commonly known as: PLAQUENIL  Take 400 mg by mouth at bedtime.   lisinopril  10 MG tablet Commonly known as: ZESTRIL  Take 10 mg by mouth daily.   Magnesium  250 MG Caps Take 1 capsule by mouth daily.   methocarbamol  500 MG tablet Commonly known as: ROBAXIN  Take 1 tablet (500 mg total) by mouth every 8 (eight) hours as needed for up to 5 days for muscle spasms.   metoprolol  succinate 25 MG 24 hr tablet Commonly known as: TOPROL -XL Take 25 mg by mouth daily.   montelukast  10 MG tablet Commonly known as: SINGULAIR  TAKE 1 TABLET BY MOUTH  AT BEDTIME   mycophenolate  500 MG tablet Commonly known as: CELLCEPT  Take 1,000 mg by mouth 2 (two) times daily.   Myrbetriq  25 MG Tb24 tablet Generic drug: mirabegron  ER Take 25 mg by mouth daily.   omeprazole 20 MG capsule Commonly known as: PRILOSEC Take 20 mg by mouth daily.   Oxycodone  HCl 10 MG Tabs Take 10 mg by mouth 4 (four) times daily as needed (Pain).   QUEtiapine  300  MG 24 hr tablet Commonly known as: SEROQUEL  XR Take 300 mg by mouth at bedtime.   sertraline  100 MG tablet Commonly known as: ZOLOFT  Take 200 mg by mouth at bedtime.   Veozah 45 MG Tabs Generic drug: Fezolinetant Take 1 tablet by mouth daily.        Diagnostic Studies: No results found.     Follow-up Information     Burnetta Aures, MD. Schedule an appointment as soon as possible for a visit in 2 week(s).   Specialty: Orthopedic Surgery Why: If symptoms worsen, For suture removal, For wound re-check Contact information: [address] [place] [address] [phone]                 Discharge Plan:  discharge to home today   Disposition:   Shannon [name] is a very pleasant 53 year old female who is POD 1 SCS battery repositioning. Surgical intervention was successful and without complications. In PACU her hospital course was slightly complicated due to low O2 stats, this is why we kept her overnight. Overnight her O2 stats have been stable. Patient is alert and oriented. Reports mild incisional pain. Dressing is C/D/I. Positive void and flatus. Negative BM. Reports pain as mild and states that medication is controlling her pain. Patient understands that she can shower 5 days post-op and that her dressing is to remain until she follows up with our office in 2 weeks. All questions were welcomed and answered. Patient understands that her battery is able to be charged.   Signed: Jeoffrey LOISE Sages for Dr. Aures [facility] [phone] 12/22/2023, 7:47 AM

## 2023-12-22 NOTE — Plan of Care (Signed)
 Problem: Education: Goal: Knowledge of General Education information will improve Description: Including pain rating scale, medication(s)/side effects and non-pharmacologic comfort measures 12/22/2023 0409 by Marvis Kenneth SAILOR, RN Outcome: Progressing 12/21/2023 2018 by Marvis Kenneth SAILOR, RN Outcome: Progressing   Problem: Health Behavior/Discharge Planning: Goal: Ability to manage health-related needs will improve 12/22/2023 0409 by Marvis Kenneth SAILOR, RN Outcome: Progressing 12/21/2023 2018 by Marvis Kenneth SAILOR, RN Outcome: Progressing   Problem: Clinical Measurements: Goal: Ability to maintain clinical measurements within normal limits will improve 12/22/2023 0409 by Marvis Kenneth SAILOR, RN Outcome: Progressing 12/21/2023 2018 by Marvis Kenneth SAILOR, RN Outcome: Progressing Goal: Will remain free from infection 12/22/2023 0409 by Marvis Kenneth SAILOR, RN Outcome: Progressing 12/21/2023 2018 by Marvis Kenneth SAILOR, RN Outcome: Progressing Goal: Diagnostic test results will improve 12/22/2023 0409 by Marvis Kenneth SAILOR, RN Outcome: Progressing 12/21/2023 2018 by Marvis Kenneth SAILOR, RN Outcome: Progressing Goal: Respiratory complications will improve 12/22/2023 0409 by Marvis Kenneth SAILOR, RN Outcome: Progressing 12/21/2023 2018 by Marvis Kenneth SAILOR, RN Outcome: Progressing Goal: Cardiovascular complication will be avoided 12/22/2023 0409 by Marvis Kenneth SAILOR, RN Outcome: Progressing 12/21/2023 2018 by Marvis Kenneth SAILOR, RN Outcome: Progressing   Problem: Activity: Goal: Risk for activity intolerance will decrease 12/22/2023 0409 by Marvis Kenneth SAILOR, RN Outcome: Progressing 12/21/2023 2018 by Marvis Kenneth SAILOR, RN Outcome: Progressing   Problem: Nutrition: Goal: Adequate nutrition will be maintained 12/22/2023 0409 by Marvis Kenneth SAILOR, RN Outcome: Progressing 12/21/2023 2018 by Marvis Kenneth SAILOR, RN Outcome: Progressing   Problem: Coping: Goal: Level of anxiety will  decrease 12/22/2023 0409 by Marvis Kenneth SAILOR, RN Outcome: Progressing 12/21/2023 2018 by Marvis Kenneth SAILOR, RN Outcome: Progressing   Problem: Elimination: Goal: Will not experience complications related to bowel motility 12/22/2023 0409 by Marvis Kenneth SAILOR, RN Outcome: Progressing 12/21/2023 2018 by Marvis Kenneth SAILOR, RN Outcome: Progressing Goal: Will not experience complications related to urinary retention 12/22/2023 0409 by Marvis Kenneth SAILOR, RN Outcome: Progressing 12/21/2023 2018 by Marvis Kenneth SAILOR, RN Outcome: Progressing   Problem: Pain Managment: Goal: General experience of comfort will improve and/or be controlled 12/22/2023 0409 by Marvis Kenneth SAILOR, RN Outcome: Progressing 12/21/2023 2018 by Marvis Kenneth SAILOR, RN Outcome: Progressing   Problem: Safety: Goal: Ability to remain free from injury will improve 12/22/2023 0409 by Marvis Kenneth SAILOR, RN Outcome: Progressing 12/21/2023 2018 by Marvis Kenneth SAILOR, RN Outcome: Progressing   Problem: Skin Integrity: Goal: Risk for impaired skin integrity will decrease 12/22/2023 0409 by Marvis Kenneth SAILOR, RN Outcome: Progressing 12/21/2023 2018 by Marvis Kenneth SAILOR, RN Outcome: Progressing   Problem: Education: Goal: Ability to verbalize activity precautions or restrictions will improve 12/22/2023 0409 by Marvis Kenneth SAILOR, RN Outcome: Progressing 12/21/2023 2018 by Marvis Kenneth SAILOR, RN Outcome: Progressing Goal: Knowledge of the prescribed therapeutic regimen will improve 12/22/2023 0409 by Marvis Kenneth SAILOR, RN Outcome: Progressing 12/21/2023 2018 by Marvis Kenneth SAILOR, RN Outcome: Progressing Goal: Understanding of discharge needs will improve 12/22/2023 0409 by Marvis Kenneth SAILOR, RN Outcome: Progressing 12/21/2023 2018 by Marvis Kenneth SAILOR, RN Outcome: Progressing   Problem: Activity: Goal: Ability to avoid complications of mobility impairment will improve 12/22/2023 0409 by Marvis Kenneth SAILOR, RN Outcome:  Progressing 12/21/2023 2018 by Marvis Kenneth SAILOR, RN Outcome: Progressing Goal: Ability to tolerate increased activity will improve 12/22/2023 0409 by Marvis Kenneth SAILOR, RN Outcome: Progressing 12/21/2023 2018 by Marvis Kenneth SAILOR, RN Outcome: Progressing Goal: Will remain free from falls 12/22/2023 0409 by Marvis Kenneth SAILOR, RN Outcome: Progressing 12/21/2023 2018 by  Marvis Kenneth SAILOR, RN Outcome: Progressing   Problem: Bowel/Gastric: Goal: Gastrointestinal status for postoperative course will improve 12/22/2023 0409 by Marvis Kenneth SAILOR, RN Outcome: Progressing 12/21/2023 2018 by Marvis Kenneth SAILOR, RN Outcome: Progressing   Problem: Clinical Measurements: Goal: Ability to maintain clinical measurements within normal limits will improve 12/22/2023 0409 by Marvis Kenneth SAILOR, RN Outcome: Progressing 12/21/2023 2018 by Marvis Kenneth SAILOR, RN Outcome: Progressing Goal: Postoperative complications will be avoided or minimized 12/22/2023 0409 by Marvis Kenneth SAILOR, RN Outcome: Progressing 12/21/2023 2018 by Marvis Kenneth SAILOR, RN Outcome: Progressing Goal: Diagnostic test results will improve 12/22/2023 0409 by Marvis Kenneth SAILOR, RN Outcome: Progressing 12/21/2023 2018 by Marvis Kenneth SAILOR, RN Outcome: Progressing   Problem: Pain Management: Goal: Pain level will decrease 12/22/2023 0409 by Marvis Kenneth SAILOR, RN Outcome: Progressing 12/21/2023 2018 by Marvis Kenneth SAILOR, RN Outcome: Progressing   Problem: Skin Integrity: Goal: Will show signs of wound healing 12/22/2023 0409 by Marvis Kenneth SAILOR, RN Outcome: Progressing 12/21/2023 2018 by Marvis Kenneth SAILOR, RN Outcome: Progressing   Problem: Health Behavior/Discharge Planning: Goal: Identification of resources available to assist in meeting health care needs will improve 12/22/2023 0409 by Marvis Kenneth SAILOR, RN Outcome: Progressing 12/21/2023 2018 by Marvis Kenneth SAILOR, RN Outcome: Progressing   Problem:  Bladder/Genitourinary: Goal: Urinary functional status for postoperative course will improve 12/22/2023 0409 by Marvis Kenneth SAILOR, RN Outcome: Progressing 12/21/2023 2018 by Marvis Kenneth SAILOR, RN Outcome: Progressing

## 2023-12-23 DIAGNOSIS — G4733 Obstructive sleep apnea (adult) (pediatric): Secondary | ICD-10-CM | POA: Diagnosis not present

## 2024-01-02 DIAGNOSIS — Z008 Encounter for other general examination: Secondary | ICD-10-CM | POA: Diagnosis not present

## 2024-01-05 DIAGNOSIS — M47816 Spondylosis without myelopathy or radiculopathy, lumbar region: Secondary | ICD-10-CM | POA: Diagnosis not present

## 2024-01-15 DIAGNOSIS — M47816 Spondylosis without myelopathy or radiculopathy, lumbar region: Secondary | ICD-10-CM | POA: Diagnosis not present

## 2024-01-15 DIAGNOSIS — M069 Rheumatoid arthritis, unspecified: Secondary | ICD-10-CM | POA: Diagnosis not present

## 2024-01-17 ENCOUNTER — Other Ambulatory Visit: Payer: Self-pay | Admitting: Primary Care

## 2024-01-21 ENCOUNTER — Encounter (HOSPITAL_COMMUNITY): Payer: Self-pay | Admitting: Orthopedic Surgery

## 2024-01-26 NOTE — Progress Notes (Signed)
 This encounter was created in error - please disregard.

## 2024-02-01 DIAGNOSIS — F332 Major depressive disorder, recurrent severe without psychotic features: Secondary | ICD-10-CM | POA: Diagnosis not present

## 2024-02-01 DIAGNOSIS — F411 Generalized anxiety disorder: Secondary | ICD-10-CM | POA: Diagnosis not present

## 2024-02-02 DIAGNOSIS — R519 Headache, unspecified: Secondary | ICD-10-CM | POA: Diagnosis not present

## 2024-02-02 DIAGNOSIS — R768 Other specified abnormal immunological findings in serum: Secondary | ICD-10-CM | POA: Diagnosis not present

## 2024-02-02 DIAGNOSIS — R4189 Other symptoms and signs involving cognitive functions and awareness: Secondary | ICD-10-CM | POA: Diagnosis not present

## 2024-02-02 DIAGNOSIS — I10 Essential (primary) hypertension: Secondary | ICD-10-CM | POA: Diagnosis not present

## 2024-02-02 DIAGNOSIS — D75A Glucose-6-phosphate dehydrogenase (G6PD) deficiency without anemia: Secondary | ICD-10-CM | POA: Diagnosis not present

## 2024-02-02 DIAGNOSIS — J849 Interstitial pulmonary disease, unspecified: Secondary | ICD-10-CM | POA: Diagnosis not present

## 2024-02-02 DIAGNOSIS — I420 Dilated cardiomyopathy: Secondary | ICD-10-CM | POA: Diagnosis not present

## 2024-02-02 DIAGNOSIS — R002 Palpitations: Secondary | ICD-10-CM | POA: Diagnosis not present

## 2024-02-03 DIAGNOSIS — M3213 Lung involvement in systemic lupus erythematosus: Secondary | ICD-10-CM | POA: Diagnosis not present

## 2024-02-03 DIAGNOSIS — R45851 Suicidal ideations: Secondary | ICD-10-CM | POA: Diagnosis not present

## 2024-02-03 DIAGNOSIS — Z1159 Encounter for screening for other viral diseases: Secondary | ICD-10-CM | POA: Diagnosis not present

## 2024-02-03 DIAGNOSIS — R21 Rash and other nonspecific skin eruption: Secondary | ICD-10-CM | POA: Diagnosis not present

## 2024-02-03 DIAGNOSIS — E559 Vitamin D deficiency, unspecified: Secondary | ICD-10-CM | POA: Diagnosis not present

## 2024-02-03 DIAGNOSIS — I7300 Raynaud's syndrome without gangrene: Secondary | ICD-10-CM | POA: Diagnosis not present

## 2024-02-03 DIAGNOSIS — Z1331 Encounter for screening for depression: Secondary | ICD-10-CM | POA: Diagnosis not present

## 2024-02-03 DIAGNOSIS — J849 Interstitial pulmonary disease, unspecified: Secondary | ICD-10-CM | POA: Diagnosis not present

## 2024-02-03 DIAGNOSIS — Z7289 Other problems related to lifestyle: Secondary | ICD-10-CM | POA: Diagnosis not present

## 2024-02-04 DIAGNOSIS — R7612 Nonspecific reaction to cell mediated immunity measurement of gamma interferon antigen response without active tuberculosis: Secondary | ICD-10-CM | POA: Diagnosis not present

## 2024-02-05 DIAGNOSIS — M47816 Spondylosis without myelopathy or radiculopathy, lumbar region: Secondary | ICD-10-CM | POA: Diagnosis not present

## 2024-02-10 ENCOUNTER — Telehealth: Payer: Self-pay | Admitting: Internal Medicine

## 2024-02-10 NOTE — Telephone Encounter (Signed)
 E2C2 did not see AVS for this PT's next appt where she needed a 15 min visit (not 30) and a Spiro (not sched.). I tried to sched a appt with a Spiro but could not find a day to do so until December. Did Dr. Coni her to come in on a different day for the Spiro (She lives in [place]) or was the Spiro not ness for this appt. Thanks.

## 2024-02-10 NOTE — Telephone Encounter (Signed)
 Patient is calling back cause she was speaking with Ms lucyann a minute ago about a appointment on the 4th and she wanted to let her know that she could accept the appointment. Calling cal and waiting on hold for someone to come back to see if someone is available to speak with patient . Still on hold no one has came back to the phone . Calling cal back to speak with someone .please give patient a call back concerning this . She wanted to speak with Ms. Tanessa

## 2024-02-10 NOTE — Telephone Encounter (Signed)
 Patient says she can accept the appointment that was on the 4th for the times ms tanessa had

## 2024-02-11 NOTE — Telephone Encounter (Signed)
 Appointment has been scheduled.

## 2024-02-14 ENCOUNTER — Other Ambulatory Visit: Payer: Self-pay | Admitting: Primary Care

## 2024-02-23 DIAGNOSIS — R4189 Other symptoms and signs involving cognitive functions and awareness: Secondary | ICD-10-CM | POA: Diagnosis not present

## 2024-02-23 DIAGNOSIS — G43709 Chronic migraine without aura, not intractable, without status migrainosus: Secondary | ICD-10-CM | POA: Diagnosis not present

## 2024-02-24 ENCOUNTER — Ambulatory Visit: Payer: Self-pay

## 2024-02-24 DIAGNOSIS — M069 Rheumatoid arthritis, unspecified: Secondary | ICD-10-CM | POA: Diagnosis not present

## 2024-02-24 DIAGNOSIS — M47816 Spondylosis without myelopathy or radiculopathy, lumbar region: Secondary | ICD-10-CM | POA: Diagnosis not present

## 2024-02-24 DIAGNOSIS — Z79891 Long term (current) use of opiate analgesic: Secondary | ICD-10-CM | POA: Diagnosis not present

## 2024-02-24 NOTE — Progress Notes (Unsigned)
 PCP [name],[name], PA-C Referred by [name]  HPI   IOV 08/23/2014 His blood work and a CT scan in a few weeks and come back c Chief Complaint  Patient presents with   Pulmonary Consult    [name] for ILD. Pt c/o SOB with activity and rest, dry cough and chest tightness also with and without activity.    53 year old female referred for evaluation of autoimmune interstitial lung disease. She presents with her husband. In 2010 while living in [place] she reports she was diagnosed with lupus associated with rheumatoid arthritis in her joints. In 2012 she moved to live in [place]  and several months after that started noticing insidious onset of shortness of breath. Local rheumatologist diagnosed her with interstitial lung disease. She was referred to [name] at [facility] in [facility] and was started on CellCept /prednisone  for autoimmune interstitial lung disease he and however, sometime around 2 years ago she ran out of medical insurance and stopped taking these medications. During this time her dyspnea has progressed. It is currently rated as moderate to severe. It is present on exertion and relieved by rest. Even minimal amount of exertion makes her very ddyspneic. Now she has insurance and she did see [name] locally and she has autoimmune panel lab ordered. In addition exam revealed crackles and there for she's been referred here for reevaluation of interstitial lung disease and dyspnea. Dyspnea is associated with some chest tightness but no chest pain. This no associated dizzine   11/24/2014 Follow UP ILD  [name]  She has autoimmune ILD with RA/Lupus  She was seen 6 weeks ago, restarted on Cellcept .  Previously on cellcept  but lost her insurance until recently.  On low dose prednisone  5mg  daily .  Last CT chest 4/4 showed ILD changes similar to 2013. Echo was ok with EFG 55-60%, nml PAP . In  March .   Did not take dapsone  ,due to  GP6D deficiency.  Labs ok last week with nml LFT , no sign change in hbg. /wbc.  She is feeling better. Does feel her breathing is some better.  No flare of cough or wheezing.     OV 01/16/2015  Chief Complaint  Patient presents with   Follow-up    Pt c/o DOE, mild dry cough, and chest tightness when SOB. Pt states the chest tightness has improved).    follow-up interstitial lung disease in the setting of rheumatoid arthritis Follow-up high risk medication use - on CellCept  and prednisone  since mid April 2016  - Presents with her husband. Both give a history. Overall she is doing better in terms of dyspnea after starting CellCept  and prednisone . However the improvement this only moderate. She still left with a residual moderate dyspnea on exertion that is also made worse with bending or heart air and improved with rest and cool air. It is associated with some cough and wheezing. She takes albuterol  inhaler which she feels helps only somewhat but not fully and not quickly enough. She is frustrated by this. In addition she's complaining of some associated right lower back paraspinal spasm for which massage gives her relief. He has never attended pulmonary rehabilitation.  - Lab review shows she has had problems with potassium and has had potassium supplementation. Last lab check was 12/08/2014. She is due for lab test right now   OV 03/28/2015   Chief Complaint  Patient presents with  Follow-up    3 month follow up. Pt states that she is still having some problems with her breathing. Pt c/o of feeling chest tightness, chest pain and cough that is dry. Pt denies wheeze. Pt states that she did trial the Advair and does feel that it helped some.   Follow-up interstitial lung disease secondary to autoimmune process and associated dyspnea that seems out of proportion\  She presents with her husband. She continues on CellCept  and prednisone . In the last  visit approximately 2-3 months ago she had out of proportion dyspnea. I gave her some Advair to try. She says this only helped a little bit. Overall she says that dyspnea still persists. It is worse than in the spring 2016 when she started CellCept  and prednisone . It is stable since July 2016. It is moderate in intensity. Occurs randomly at rest but also with exertion. Occasionally relieved by rest but also happens at rest. Heat and humidity make it worse. Advair helped a little bit only. This no associated chest tightness or wheezing. She did try and enroll  in pulmonary rehabilitation but could not afford the the startup program and is waiting to hear from them for the maintenance program. She is frustrated by all the symptoms.  Pulmonary function test October 15,016 today FVC 2.55 L/64%, total lung capacity 4 L/65% and diffusion 14.5/42%. Overall no change since April 2016   OV 07/04/2015  Chief Complaint  Patient presents with   Follow-up    pt. states breathing is baseline. SOB. dry cough. wheezing. occ. chest pain/tightness. feels her airway is blocked.   Follow-up interstitial lung disease admitted autoimmune processes and associated out of proportion dyspnea  She presents again with her husband. She continues on CellCept  2 g twice daily associated with prednisone . She cannot take Bactrim or dapsone  due to G6PD deficiency. Last seen in the fall of 2016. She was having out of proportion dyspnea. Rated cardia pulmonary stress test and she could not tolerate this test. She then underwent right heart catheterization mid-November 2016 with Dr. Ezra Shuck. Review the tests this is normal. Overall stable but she and husband still continued to complain about this resting dyspnea associated with wheezing. They hear noises in the chest. Sometime she gasps for air even at rest. She feels it comes from the chest but the husband points to the throat. I offered second opinion at [facility] because of  this unusual symptoms but they declined citing distance. She was supposed to attend pulmonary rehabilitation but they cannot afford a $60 co-pay twice a week 8 weeks. Offered ENT evaluation that agreeable but they wanted done in [place] which is closer logistically. In addition patient is contemplating now applying for disability. 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  Walking desaturation test 185 feet 3 laps and rheumatic: No desaturation    OV 10/12/2015  Chief Complaint  Patient presents with   Follow-up    PFT today.  breathing is better.  Pt is currently on STD, [employer] approved STD until today, wants to know today if pt is able to return to work, any restrictions.  Pt states that she has been more stable while out of work.      Follow-up interstitial lung disease due to autoimmune processes not otherwise specified  Last seen January 2017. Since then she has been on short-term disability and out of work. She does heavy manual work. The lack of work estimated dyspnea better. She did pulmonary function test today  that I personally visualized and overall it is same. There are no new issues. She is on CellCept  and she is tolerating this fine. Last visit she had some anemia we follow this up and the anemia was better. Also hypokalemia resolved by February 2017. She is due for blood work today. 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 This showed up at last visit. She saw an ENT in [place] within referred her to [facility]. She does not want to go to [facility]. She ended up seeing Dr. Ethyl locally. But now she is going to see Dr.  Daniel Shiley In [facility]   Pulmonary function test today 10/12/2015 shows postbronchodilator FVC 2.65 L/67%. FEV1 2.34 L/72% which is up 17% positive bronchodilator response. Ratio of 80/106%. Total lung capacity of 3.8/62%. DLCO 18.36/52%.  Overall consistent with restriction and lung parenchymal disease. Overall PFTs are stable compared to April 2016 but perhaps in DLCO slightly better   OV 04/15/2016  Chief Complaint  Patient presents with   Interstitial Lung Disease    Breathing is unchanged since last OV. Reports SOB, coughing. Cough is non productive. Denies chest tightness or wheezing.   Follow-up interstitial lung disease due to autoimmune processes not otherwise specified. On CellCept  and prednisone . Not on Bactrim prophylaxis due to G6PD deficiency  Last visit April 2017. At that time based on pulmonary function tests showed stability and ILD for a year. 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 She did see local ENT doctor [place] and apparently has been reassured. At this point in time she is applying for long-term disability at work but the work discharged from services and she is now applying for Social Security disability. Her dyspnea stable since the interim. She's also had hysterectomy but for the last few weeks her cough is worse and it is dry. This no fever. Is no weight loss or chills. She says she's compliant with her CellCept  and prednisone .  OV 05/29/2016  Chief Complaint  Patient presents with   Follow-up    Pt states she still has harsh dry cough, pt states her SOB is unchanged and chest tightness when SOB or when coughing a lot. Pt deneis f/c/s.     Follow-up interstitial lung disease due to autoimmune processes not otherwise specified. On CellCept  and prednisone  since April 2016. Not on Bactrim prophylaxis due to G6PD deficiency  Amillion returns for follow-up. She had high resolution CT chest that shows stability and interstitial lung disease since April 2016. She Pulmicort  function test that shows mild improvement since April 2016. Therefore it appears that her CellCept  and prednisone  is helping her she'll lung disease related to collagen-vascular disease. However she  tells me that she continues to have chronic cough for the last few to several months. It is progressive. It is dry. It is worse than her baseline. The dyspnea is unchanged. This no fever or weight loss or chills   OV 07/24/2016  Chief Complaint  Patient presents with   Follow-up    Pt states she feels the Arnuity has been helping her breathing. Pt c/o dry cough and occ chest tightness.   rec    ICD-9-CM ICD-10-CM   1. ILD (interstitial lung disease) (HCC) 515 J84.9   2. Chronic cough 786.2 R05   3. High risk medication use V58.69 Z79.899    Clinically improved with cellcept /prednisone  and arnuity  Blood work ok dec 2017  pLAN - start pulm rehab at [facility] - continue  aruity daily - continue cellcept  and prednisone   Followup - 6 months do Pre-bd spiro and dlco only. No lung volume or bd response. No post-bd spiro - 6 months fu Dr Geronimo after above or sooner if needed     12/15/2016  f/u ov/Wert re: cough dry on arnuity  Chief Complaint  Patient presents with   Follow-up    Breathing is unchanged since her last visit. Pt states she is here to f/u on recent ABG result. She c/o feeling tired all of the time. She has occ cough- non prod.       Finished at Rehab   beginning  Of May  2018 and trouble walking fast or uphill Waking up with ha's since rehab sev days a week  Cough is new x one month mostly dry and day > noct   No obvious day to day or daytime variability or assoc excess/ purulent sputum or mucus plugs or hemoptysis or cp or chest tightness, subjective wheeze or overt sinus or hb symptoms. No unusual exp hx or h/o childhood pna/ asthma or knowledge of premature birth.   OV 02/13/2017  Chief Complaint  Patient presents with   Follow-up    cough/ILD follow up - prod cough with brownish/green mucus with tightness and chest pain x2 days.  denies any f/c/s, hemoptysis.  PFT done today.    Follow-up interstitial lung disease due to autoimmune processes not  otherwise specified. On CellCept  and prednisone  since April 2016. Not on Bactrim prophylaxis due to G6PD deficiency  53 year old female immunosuppressed with CellCept  and prednisone . Not seen her in many months. Currently taking prednisone  3 mg per day and CellCept  1000 mg twice daily. She cannot do Bactrim prophylaxis because of G6PD deficiency. She tells me that overall her health has been stable but in the last few weeks noticing increased shortness of breath in the last few days this increased cough and change in color of sputum to green and increased chest tightness and a feeling that she is getting acute bronchitis. She also was contemplated going to the emergency room a few days ago but now she is better. There is no obvious fever or chills or hemoptysis or edema paroxysmal nocturnal dyspnea or orthopnea. Pulmonary function test today shows 10% decline in FVC and DLCO compared to baseline.   OV 06/08/2017  Chief Complaint  Patient presents with   Follow-up    Feeling about the same as last visit. Still having chest tightness and wheezing at times, Sounds hoarse.     Follow-up interstitial lung disease due to autoimmune processes not otherwise specified. On CellCept  and prednisone  since April 2016. Not on Bactrim prophylaxis due to G6PD deficiency. Normal Right heart cath Nov 2016.  Last high-resolution CT November 2017  Last visit August 2018.  There is a routine follow-up.  Overall she feels stable.  FVC shows stability since August 2018 but declined since 1 year ago.  DLCO shows continued decline.  Though her lung health is stable.  She says she lost her 1 only biological sister last week due to lupus.  The sister was only 57 and lived in [place].  There are no new issues. Walking desaturation test on 06/08/2017 185 feet x 3 laps on ROOM AIR:  did not desaturate. Rest pulse ox was 100%%, final pulse ox was 100%. HR response 81/min at rest to 109/min at peak exertion. Patient Shannon [name]  Did not Desaturate < 88% . Shannon [name] did nto  Desaturated </= 3% points. CHARMAINE PLACIDO yes did get tachyardic   OV 10/15/2017   Follow-up interstitial lung disease due to autoimmune processes not otherwise specified. On CellCept  and prednisone  since April 2016. Not on Bactrim prophylaxis due to G6PD deficiency. Normal Right heart cath Nov 2016 and feb 2019.  Last high-resolution CT November 2017 and dec 2018 without progresion   Last visit December 2018.  This is a routine follow-up.  In the interim she had high-resolution CT scan of the chest that did not show any progression in interstitial lung disease between 2017 and 2018.SABRA  Therefore we did an echocardiogram that showed slight elevation in pulmonary artery systolic pressure.  Therefore we sent it to her repeat right heart catheterization and this was normal as documented below.  Overall she feels stable compared to one year ago but has declined compared to 2 years ago.  She is also complaining about a new recurrence of cough that happens mostly at night despite Symbicort  and prednisone  and CellCept .  It wakes her up.  It is moderate in intensity.  There is no associated wheezing or edema orthopnea.  It happens randomly.  There is associated heartburn.  She is on nothing for acid reflux.       Right Heart Pressures 08/20/17 [facility] Procedural Findings: Hemodynamics (mmHg) RA mean 3 RV 30/6 PA 23/8, mean 14 PCWP mean 8  Oxygen  saturations: PA 72% AO 98%  Cardiac Output (Fick) 6.73  Cardiac Index (Fick) 3.06  Cardiac Output (Thermo) 6.94 Cardiac Index (Thermo) 3.15    OV 12/01/2017  Chief Complaint  Patient presents with   Follow-up    Pt has SOB with exertion, wheezing, some chest tightness. Pt was dry cough more at bedtime.      Follow-up interstitial lung disease due to autoimmune processes not otherwise specified. On CellCept  and prednisone  since April 2016. Not on Bactrim prophylaxis due to G6PD deficiency.  Normal Right heart cath Nov 2016 and feb 2019.  Last high-resolution CT November 2017 and dec 2018 without progresion   This visit is to see if she is got progressive interstitial lung disease.  Last visit she was complaining of more cough.  Therefore we added some acid reflux treatment.  She says after the acid reflux treatment symptoms have actually improved.  This contradicts what she told the CMA at intake.  Overall she is feeling stable.  However she does have a new episode of orthostatic dizziness going on at a mild level for the last 1 week.  Today after doing pulmonary function test she did feel dizzy.  Therefore we checked orthostatics on her and she got tachycardic standing up and her blood pressure did drop although still within normal limits.  She does not have any chest pain.  Lung function test shows continued stability in the last 1 year including compared to the most recent attempt.  OV 03/08/2018  Subjective:  Patient ID: Shannon [name], female , DOB: Nov 27, 1970 , age 57 y.o. , MRN: [id number] , ADDRESS: [address]  [place] [facility] [address]   03/08/2018 -   Chief Complaint  Patient presents with   Follow-up    Spirometry performed today. Pt states she is still having some mild problems with dizziness but not as bad as last visit. Pt also states she has been having problems with headaches x1 month. Pt states her breathing is about the same  as last visit and also states she still has the dry cough.    Follow-up interstitial lung disease due to autoimmune processes not otherwise specified. On CellCept  and prednisone  since April 2016. Not on Bactrim prophylaxis due to G6PD deficiency. Normal Right heart cath Nov 2016 and feb 2019.  Last high-resolution CT November 2017 and dec 2018 without progresion     HPI Shannon [name] 53 y.o. - connective tissue disease ILD. Follow-up. Last seen June 2019 by she started having new onset dizziness. It seemed orthostatic. She says since then it is  spontaneously improved but still persists. She is also dealing with fatigue issues. In 02/26/2018 she felt she had a lupus flare saw Dr. JONETTA and given a prednisone  burst. Autoimmune profile at that time showed continued positive ANA titer but normal complements. She had a CBC that showed a drop in hemoglobin by 1 g percent. Her baseline is around 11.5 g percent and currently it is around 10.5 g percent. She says after the prednisone  burst a lupus flare symptoms and fatigue have improved although it still persists. If it is actually worse in the last few months. She had pulmonary function test that shows continued improvement especially following the recent prednisone  burst. Walking desaturation test was normal other than tachycardia. We observed her to be walking very slowly in a very deconditioned and fatigued fashion. 02/26/2089 and liver function and renal function reviewed and this was normal. Medication review shows she is on oxycodone  and gabapentin  for back pain. She says she's been on gabapentin  for the last 1 year. It appears that the temporal sequence of fatigue and dizziness might be related to gabapentin  but she is not so sure.       Results for SHALOM, WARE (MRN [id number]) as of 03/08/2018 10:51  Ref. Range 02/26/2018 10:53  Hemoglobin Latest Ref Range: 11.7 - 15.5 g/dL 89.4 (L)  Results for JAHANNA, RAETHER (MRN [id number]) as of 03/08/2018 10:51  Ref. Range 02/26/2018 10:53  Creatinine Latest Ref Range: 0.50 - 1.10 mg/dL 9.26  Results for MARICELA, SCHREUR (MRN [id number]) as of 03/08/2018 10:51  Ref. Range 02/26/2018 10:53  AST Latest Ref Range: 10 - 35 U/L 19  ALT Latest Ref Range: 6 - 29 U/L 18   Results for LOGAN, VEGH (MRN [id number]) as of 03/08/2018 10:51  Ref. Range 02/26/2018 10:53  Anti Nuclear Antibody(ANA) Latest Ref Range: NEGATIVE  POSITIVE (A)  ANA Pattern 1 Unknown SPECKLED (A)  ANA Titer 1 Latest Units: titer > OR = 1:1280 (A)  ds DNA Ab Latest Units: IU/mL <1  C3  Complement Latest Ref Range: 83 - 193 mg/dL 864  C4 Complement Latest Ref Range: 15 - 57 mg/dL 33    ROS - per HPI    OV 06/03/2018  Subjective:  Patient ID: Shannon [name], female , DOB: 04-06-1971 , age 89 y.o. , MRN: [id number] , ADDRESS: [address]  [place] [facility] [address]   06/03/2018 -   Chief Complaint  Patient presents with   Follow-up    pt states breathing is baseline. c/o sob with exertion, non prod cough & wheezing    Follow-up interstitial lung disease due to autoimmune processes not otherwise specified. On CellCept  and prednisone  since April 2016. Not on Bactrim prophylaxis due to G6PD deficiency. Normal Right heart cath Nov 2016 and feb 2019.  Last high-resolution CT November 2017 and dec 2018 without progresion   HPI PALLIE SWIGERT 53 y.o. -presents for follow-up.  Last visit September 2019.  At that time CBC showed anemia.  We repeated the hemoglobin 1 month later and was stable around 10 g%.  She tells me that overall she is stable.  Last visit she had dizziness and I told her to stop gabapentin  which she did and her dizziness and fatigue have improved.  She feels she is stable but in the last 3 days she has had a dry cough but no fever or congestion or wheezing or hemoptysis or edema.  No worsening shortness of breath or chest tightness.  Other than that she feels good.  She did up walking desaturation test today and it appears similar to previous visit.       OV 05/13/2019  Subjective:  Patient ID: Shannon [name], female , DOB: 17-May-1971 , age 44 y.o. , MRN: [id number] , ADDRESS: [address] [place] [address]   05/13/2019 -   Chief Complaint  Patient presents with   Follow-up    Pt just had total left knee replacement mid October 2020. Pt said she still has problems with SOB especially with going upstairs. Pt also has occ wheezing but denies any real complaints of cough.   Follow-up interstitial lung disease due to autoimmune processes not  otherwise specified. On CellCept  and prednisone  since April 2016. Not on Bactrim prophylaxis due to G6PD deficiency. Normal Right heart cath Nov 2016 and feb 2019.  Last high-resolution CT November 2017 and dec 2018 without progression. Last PFT Sept 2019  HPI SHADA NIENABER 53 y.o. -returns for follow-up.  Last seen in September 2019 and because of the pandemic things got delayed.  She was supposed to see me in 6 months with spirometry and high-resolution CT chest for progression but this did not happen.  At this follow-up we do not have those data points as yet.  She tells me the interim she has had knee surgery and therefore she is not able to do a simple walk test with us .  She has gained some weight.  Also in the interim she is now on Social Security disability and she is unemployed now.  She feels her shortness of breath is slightly worse particularly when walking up stairs but otherwise overall she feels stable.  I reviewed her blood work.  And it seems anemia is worse in the 9 g%.  She really denies any bleeding.  Normally she runs between 10 and 11 g%.  I noticed that she still not had a shingles vaccine.   OV 01/09/2020   Subjective:  Patient ID: Shannon [name], female , DOB: 1970-10-16, age 69 y.o. years. , MRN: [id number],  ADDRESS: [address] [place] [address] PCP  Venancio Pock, PA-C Providers : Treatment Team:  Attending Provider: Geronimo Amel, MD    Follow-up interstitial lung disease due to autoimmune processes not otherwise specified. On CellCept  and prednisone  since April 2016. Not on Bactrim prophylaxis due to G6PD deficiency. Normal Right heart cath Nov 2016 and feb 2019.  Last high-resolution CT November 2017 and dec 2018 and dec 2020 without progression. Last PFT Sept 2019 and now June 2021  Chief Complaint  Patient presents with   Follow-up       HPI Shannon [name] 53 y.o. -returns for follow-up.  Since her last visit she continues to be stable.   She is only on CellCept .  She is also on Plaquenil .  She is not on prednisone  or antifibrotic's.  Her dyspnea is  stable.  Her pulmonary function test is improved and a CT chest shows stability.  The main issue is that she says over the last 3 years she has gained 30 to 40 pounds of weight.  She says that she is snoring a lot according to her husband.  Apparently the husband's also noticed apneic spells.  She says she has had the Covid vaccine but is not documented in our chart.  She is yet to have the Shingrix vaccine.       Results for RENIYA, MCCLEES (MRN [id number]) as of 02/13/2017 12:16  Ref. Range 10/09/2014 09:39 03/28/2015 11:40 10/12/2015 13:42 05/06/2016 10:38 02/13/2017 10:49 06/08/2017  12/01/2017  03/08/2018  11/24/19  FVC-Pre Latest Units: L 2.54 2.55 2.53 2.63 2.32 2.31 2.38 2.46 2.67  FVC-%Pred-Pre Latest Units: % 64 64 64 67 59 59% 61% 67% 73%   Results for AUDRINA, MARTEN (MRN [id number]) as of 02/13/2017 12:16  Ref. Range 10/09/2014 09:39 03/28/2015 11:40 10/12/2015 13:42 05/06/2016 10:38 02/13/2017 10:49 06/08/2017  12/01/2017  03/08/2018  11/24/19  DLCO unc Latest Units: ml/min/mmHg 15.03 14.50 18.36 18.31 16.35 14.21 16.38 x 18.56  DLCO unc % pred Latest Units: % 43 42 53 53 47 41 47% x 72%    ROS - per HPI Results for PICCOLA, ARICO (MRN [id number]) as of 01/09/2020 16:33  Ref. Range 12/21/2019 22:12  Creatinine Latest Ref Range: 0.44 - 1.00 mg/dL 9.17  Results for AYVEN, GLASCO (MRN [id number]) as of 01/09/2020 16:33  Ref. Range 12/21/2019 22:12  Hemoglobin Latest Ref Range: 12.0 - 15.0 g/dL 89.3 (L)   IMPRESSION: dec 2020 compared to 2018 1. The appearance of the lungs remains compatible with interstitial lung disease, with a spectrum of findings considered indeterminate for usual interstitial pneumonia (UIP) per current ats guidelines. However, given the spectrum of findings and the stability of the findings compared to the prior study, this is once again strongly favored to  represent nonspecific interstitial pneumonia (NSIP).     Electronically Signed   By: Toribio Aye M.D.   On: 06/15/2019 09:42 ROS - per HPI    OV 07/27/2020  Subjective:  Patient ID: Shannon [name], female , DOB: 01/26/1971 , age 64 y.o. , MRN: [id number] , ADDRESS: [address] [place] [address] PCP Venancio Pock, PA-C Patient Care Team: [name], [name], PA-C as PCP - General (Internal Medicine)  This Provider for this visit: Treatment Team:  Attending Provider: Geronimo Amel, MD    07/27/2020 -   Chief Complaint  Patient presents with   Follow-up    Get pft results.   Follow-up interstitial lung disease due to autoimmune processes not otherwise specified. -Lupus per [facility] clinic notes in December 2021.   - On CellCept  and prednisone  since April 2016.  Off prednisone  in 2021  Not on Bactrim prophylaxis due to G6PD deficiency.    - Normal Right heart cath Nov 2016 and feb 2019  -  Last high-resolution CT November 2017 and dec 2018 and dec 2020 without progression.    - Last PFT Sept 2019 and now June 2021 and feb 2022  Chief Complaint  Patient presents with   Follow-up      HPI Shannon [name] 53 y.o. -returns for routine ILD follow-up.  Last seen in July 2021.  Clinically she is stable.  Since last seeing me November 2020 when she had right knee surgery.  She uses a cane.  She is also seen Dr. Ernie in sleep  clinic.  She is now on CPAP and it is helping her.  Her weight gain persists.  Her mobility has therefore gone down but from a dyspnea standpoint she is stable.  She is up-to-date with her COVID vaccine.  With the booster she did get symptomatic with Sirs response.  She continues on CellCept .  She is now seeing [facility] rheumatology.  She does not see [facility] anymore.  She believes she has lupus.  She has an appointment upcoming in a few days.  Review of the records at [facility] indicate t that she has lupus.  She continues to  be off prednisone .  She is unable to do a walk test today because of her knee issues and also she is on a cane.  She has pulmonary function test there is a 2-3.8% decline in FVC/DLCO.  This could easily be because of weight gain.  Her symptom scores are stable.  She did have CT angiogram in the summer 2021 this ruled out pulmonary embolism.  There is no comment about ILD.  But then it is a contrast CT.    OV 03/05/2021  Subjective:  Patient ID: Shannon [name], female , DOB: 08/19/1970 , age 53 y.o. , MRN: [id number] , ADDRESS: [address] [place] [address] PCP Venancio Pock, PA-C Patient Care Team: [name], [name], PA-C as PCP - General (Internal Medicine)  This Provider for this visit: Treatment Team:  Attending Provider: Geronimo Amel, MD    03/05/2021 -   Chief Complaint  Patient presents with   Follow-up    PFT performed today.  Pt states she has been doing okay since last visit and states that her breathing is about the same.   Follow-up interstitial lung disease due to autoimmune processes not otherwise specified. -Lupus per [facility] clinic notes in December 2021.   - On CellCept  and prednisone  since April 2016.  Off prednisone  in 2021  Not on Bactrim prophylaxis due to G6PD deficiency.    - Normal Right heart cath Nov 2016 and feb 2019  -  Last high-resolution CT November 2017 and dec 2018 and dec 2020 without progression. - > July 2021 CTA   - Last PFT Sept 2019 and now June 2021 and feb 2022 and sept 2022  HPI Shannon [name] 53 y.o. -returns for follow-up.  She continues on her immunosuppressive regimen through [facility] rheumatology program.  She is happy with the care there.  She is not on prednisone .  She uses CPAP through Dr. MALVA.  Overall she is feeling stable.  Symptom scores are stable.  She had pulmonary function test and it shows that her FVC is stable but DLCO is declined.  This correlates with a hemoglobin around 10 last done in June 2022 at  [facility].  But then she is also chronically anemic.  She is definitely not noticing any worsening in her shortness of breath.  Her walking desaturation test is also stable.  She wants her flu shot today.        PFT  OV 03/18/2022  Subjective:  Patient ID: Shannon [name], female , DOB: [birth year] , age 87 y.o. , MRN: [id number] , ADDRESS: [address] [place] [address] PCP Venancio Pock, PA-C Patient Care Team: [name], [name], PA-C as PCP - General (Internal Medicine)  This Provider for this visit: Treatment Team:  Attending Provider: Geronimo Amel, MD    03/18/2022 -   Chief Complaint  Patient presents with  Follow-up    Pt states she is better compared to last visit and states the cough is better.   HPI Shannon [name] 53 y.o. -returns for follow-up.  I last saw her 1 year ago.  She continues on CellCept  and Plaquenil  through [facility].  Shortness of breath is stable.  She had high-resolution CT chest in March 2023 and that is without progression.  She has no other new issues.  She did in the interim tear her rotator cuff on the left side and had surgery.  She is having some sciatic problems.  She will have the flu shot today.  She follows with Dr. MALVA for sleep apnea.    CT Chest data HRCT March 2023  IMPRESSION: Basilar and subpleural predominant ground-glass and septal thickening with air trapping, similar to 12/22/2019. Assessment is somewhat limited by respiratory motion and body habitus. Nonspecific interstitial pneumonitis is favored. Findings are indeterminate for UIP per consensus guidelines: Diagnosis of Idiopathic Pulmonary Fibrosis: An Official ATS/ERS/JRS/ALAT Clinical Practice Guideline. Am JINNY Honey Crit Care Med Vol 198, Iss 5, [phone], Feb 21 2017.     Electronically Signed   By: Newell Eke M.D.   On: 08/28/2021 11:12    No results found.     OV 10/29/2022  Subjective:  Patient ID: Shannon [name], female , DOB:  [birth year] , age 20 y.o. , MRN: [id number] , ADDRESS: [address] [place] [address] PCP Venancio Pock, PA-C Patient Care Team: [name], [name], PA-C as PCP - General (Internal Medicine)  This Provider for this visit: Treatment Team:  Attending Provider: Geronimo Amel, MD 10/29/2022 -   Chief Complaint  Patient presents with   Follow-up    No complaints.  PFT tomorrow.     HPI Shannon [name] 53 y.o. -returns for follow-up of her ILD due to lupus.  She continues to be stable.  She is supposed a pulmonary function test but she got the visit scheduled today and the pulmonary function schedule tomorrow 10/30/2022.  Nevertheless she feels stable.  She continues on CellCept  through [facility] rheumatology.  She has no new issues.  Her main complaint is that she has low backache because of degenerative disc disease in the L4-L5 and L5-S1 areas.  She send procedure is being considered.  I have asked her to consider professional stretching through several, she will stretch facilities that are available.  The commercial facilities available in the local area stretch zone, stretch med and stretch lab.  Asked her to get clearance from her doctor.  She is willing to try this.  I did indicate to her that it could be a out-of-pocket cost.  There is clinical research evidence showing stretching to reduce pain and improve mobility.      OV 08/17/2023  Subjective:  Patient ID: Shannon [name], female , DOB: 10-16-1970 , age 58 y.o. , MRN: [id number] , ADDRESS: [address] [place] [address] PCP Venancio Pock, PA-C Patient Care Team: [name], [name], PA-C as PCP - General (Internal Medicine)  This Provider for this visit: Treatment Team:  Attending Provider: Geronimo Amel, MD  08/17/2023 -   Chief Complaint  Patient presents with   Follow-up     HPI XOE HOE 52 y.o. -is a routine follow-up.  Last seen in May 2024.  She was then November 2020 for visit.  She says she  continues to be stable.  The symptom score suggests continued stability.  The exercise hypoxemia test  shows continued stability.  In November 2024 she had pulmonary function testing that shows a decline compared to 2022 but also this.  She is gaining a lot of weight.  In the last 1 year she has lost 17 pounds of weight but overall she is morbidly obese.  Past medical issues include spinal stimulator since October 2024 for her back and also being on metformin  for insulin  resistance  No change in ongoing immunosuppressive medication.  CT Chest data from date: march 2023  IMPRESSION: Basilar and subpleural predominant ground-glass and septal thickening with air trapping, similar to 12/22/2019. Assessment is somewhat limited by respiratory motion and body habitus. Nonspecific interstitial pneumonitis is favored. Findings are indeterminate for UIP per consensus guidelines: Diagnosis of Idiopathic Pulmonary Fibrosis: An Official ATS/ERS/JRS/ALAT Clinical Practice Guideline. Am JINNY Honey Crit Care Med Vol 198, Iss 5, [phone], Feb 21 2017.     Electronically Signed   By: Newell Eke M.D.   On: 08/28/2021 11:12   OV 12/01/2023  Subjective:  Patient ID: Shannon [name], female , DOB: Dec 20, 1970 , age 59 y.o. , MRN: [id number] , ADDRESS: [address] [place] [address] PCP Venancio Pock, PA-C Patient Care Team: Venancio Pock RIGGERS as PCP - General (Internal Medicine)  This Provider for this visit: Treatment Team:  Attending Provider: Geronimo Amel, MD    12/01/2023 -   Chief Complaint  Patient presents with   Follow-up    Follow up , ILD , needs clearance for surgery  for back sugery hasn't  set up as of yet       HPI Shannon [name] 52 y.o. -returns for follow-up.  This visit is to assess for progression of ILD.  Her symptom score seems slowly worse compared to 2 years ago.  She feels because of weight gain.  She is also reporting that the neurostimulator in her  back is repositioned and she requires another surgery and this could also be contributing to increased shortness of breath via virtue of worsening back pain.  Exercise hypoxemia test is stable.  However pulmonary function test shows continued decline.  She did have a high-resolution CT chest in the spring 2025 but this was only compared to earlier in the year and it is noted as stable.  But at least in 2022 pulmonary function test seems to show continued decline both FVC and DLCO.  At this point in time her priority is to get her back surgery.  She continues on CellCept .  OV 02/25/2024  Subjective:  Patient ID: Shannon [name], female , DOB: 10/06/1970 , age 38 y.o. , MRN: [id number] , ADDRESS: [address] [place] [address] PCP Venancio Pock, PA-C Patient Care Team: Venancio Pock RIGGERS as PCP - General (Internal Medicine)  This Provider for this visit: Treatment Team:  Attending Provider: Geronimo Amel, MD     Follow-up interstitial lung disease due to autoimmune processes not otherwise specified. -Lupus per [facility] clinic notes in December 2021.   - On CellCept   - s/p Prednisone  since April 2016 - Off prednisone  in 2021   - Not on Bactrim prophylaxis due to G6PD deficiency.    - Normal Right heart cath Nov 2016 and feb 2019  -  High-resolution CT -  November 2017 and dec 2018 and dec 2020 without progression. - > July 2021 CTA -. -  HRCT March 2023 - no change sinc 2021     - Last PFT June 2025   02/25/2024 -  Chief Complaint  Patient presents with   Medical Management of Chronic Issues   Interstitial Lung Disease    PFT repeated today. Breathing has gradually worsened since the last visit. She has occ dry cough.      HPI Shannon [name] 53 y.o. -returns for follow-up.  This concern for progressive phenotype because pulmonary function test has progressively declined between 2022 and also most recently June 2025.  After that she has spinal cord stimulator  surgery.  Shortness of breath is stable.  Exercise hypoxemia test is stable.  Pulmonary function test actually shows an improvement and it is better than 1 year ago although it still worse compared to 2022.  She feels the same.  [facility]  She saw Dr. Austin.  Benlysta has been considered.  Is dated a QuantiFERON gold and it is positive.  Therefore she is being referred to the Department of Public Health.  She is waiting to hear from them she has had a chest x-ray with them.  She is surprised about this result.  Currently there are no B symptoms but she is immunosuppressed.  I did educate her about latent TB and the risk for active TB in the setting of immunosuppression.       SYMPTOM SCALE - ILD 05/13/2019  07/27/2020  03/05/2021  03/18/2022  10/29/2022  08/17/2023  Cellcept  + Not on pred Singular + 12/01/2023 Cellcept  + Not on pred Singular + 02/25/2024   O2 use RA ra ra ra ra ra    Shortness of Breath 0 -> 5 scale with 5 being worst (score 6 If unable to do)         At rest 2 1 1 2 3 1 1 3   Simple tasks - showers, clothes change, eating, shaving 3 2 2 3 2 2 2 3   Household (dishes, doing bed, laundry) 3 3 4 3 3 2 3 2   Shopping 3 4 3  Did not ansewr No aswer 3 3 3   Walking level at own pace 2 3 3 4 2 3 3 3   Walking up Stairs 5 5 3 4 1 3 4 4   Total (40 - 48) Dyspnea Score 29 18 16 16 11 14 16 18   How bad is your cough? 0 2 0 0 2 2 1 1   How bad is your fatigue 3 0 3 2 0 4 3 4   nausea  0 0 0 0 0 x 0  vomit  0 0 0 0 0 x 0  diarrhea  0 0 0 4 0 0 0  anxiety  4 4 4 3 3  0 3  depression  4  3 z 3 4 4             SIT STAND TEST - goal 15 times   12/01/2023  02/25/2024   O2 used ra ra  PRobe - finter or forehead fing finter  Number sit and stand completed - goal 15 15 15   Time taken to complete 2 min 41 sec  Resting Pulse Ox/HR/Dyspnea  99% and 99/min and dyspnea of 0/10  97% and R 103   Peak measures 98 % and 115/min and dyspnea of 4/10 98% and HR 114  Final Pulse Ox/HR 99% and 103/min and dyspnea of 2/10 99% and HR 110  Desaturated </= 88% no no  Desaturated <= 3% points no no  Got Tachycardic >/= 90/min yes yes  Miscellaneous comments x Worst dyspnea score 5       PFT     Latest Ref Rng & Units 02/25/2024    1:54 PM 12/01/2023   10:52 AM 05/08/2023   10:57 AM 03/05/2021    2:58 PM 07/27/2020    8:55 AM 11/24/2019   11:04 AM 03/08/2018   10:35 AM  PFT Results  FVC-Pre L 2.26  P 2.04  2.13  2.64  2.59  2.67  2.46   FVC-Predicted Pre % 53  P 48  50  73  72  73  67   Pre FEV1/FVC % % 90  P 88  86  85  88  83  84   FEV1-Pre L 2.02  P 1.79  1.84  2.24  2.28  2.21  2.07   FEV1-Predicted Pre % 60  P 53  54  77  78  75  70   DLCO uncorrected ml/min/mmHg 16.02  P 14.43  14.28  15.34  19.54  18.56    DLCO UNC% % 63  P 57  56  60  76  72    DLCO corrected ml/min/mmHg 16.02  P 15.93  14.28  15.34  19.54  20.32    DLCO COR %Predicted % 63  P 63  56  60  76  79    DLVA Predicted % 112  P 115  94  97  117  122      P Preliminary result       LAB RESULTS last 96 hours No results found.       has a past medical history of Anemia, Anxiety, Bilateral swelling of feet, Bronchitis, DDD (degenerative disc disease), lumbosacral, Depression, Dilated cardiomyopathy (HCC), Dyspnea, G6PD deficiency, GERD (gastroesophageal reflux disease), Hypertension, ILD (interstitial lung disease) (HCC), Lactose intolerance, Lupus, Osteoarthritis, Palpitations, Pneumonia, Rheumatoid arthritis (HCC), SLE (systemic lupus erythematosus) (HCC), and Sleep apnea.   reports that she quit smoking about 9 years ago. Her smoking use included cigarettes. She started smoking about 23 years ago. She has a 7 pack-year smoking history. She has never used smokeless tobacco.  Past Surgical History:  Procedure Laterality Date   ABDOMINAL HYSTERECTOMY     APPENDECTOMY  1980   cardiac catherization  08/20/2017    CARDIAC CATHETERIZATION N/A 05/10/2015   Procedure: Right Heart Cath;  Surgeon: Ezra GORMAN Shuck, MD;  Location: [facility] INVASIVE CV LAB;  Service: Cardiovascular;  Laterality: N/A;   CESAREAN SECTION  '95, '02, '07   X 3   CHOLECYSTECTOMY  2010   LAPAROSCOPIC HYSTERECTOMY  10/2015   have ovaries   RIGHT HEART CATH N/A 08/20/2017   Procedure: RIGHT HEART CATH;  Surgeon: Shuck Ezra GORMAN, MD;  Location: [facility] INVASIVE CV LAB;  Service: Cardiovascular;  Laterality: N/A;   SHOULDER ARTHROSCOPY WITH ROTATOR CUFF REPAIR AND SUBACROMIAL DECOMPRESSION Left 08/13/2022   Procedure: SHOULDER ARTHROSCOPY WITH SUBACROMIAL DECOMPRESSION, DEBRIDEMENT AND PARTIAL ROTATOR CUFF REPAIR;  Surgeon: Duwayne Purchase, MD;  Location: [facility] ORS;  Service: Orthopedics;  Laterality: Left;   SHOULDER ARTHROSCOPY WITH SUBACROMIAL DECOMPRESSION Right 08/28/2021   Procedure: SHOULDER ARTHROSCOPY WITH SUBACROMIAL DECOMPRESSION AND DEBRIDEMENT;  Surgeon: Duwayne Purchase, MD;  Location: [facility] ORS;  Service: Orthopedics;  Laterality: Right;   SPINAL CORD STIMULATOR BATTERY EXCHANGE N/A 12/21/2023   Procedure: SPINAL CORD STIMULATOR BATTERY EXCHANGE;  Surgeon: Burnetta Aures, MD;  Location: [facility] OR;  Service: Orthopedics;  Laterality: N/A;  REPOSITION SPINAL CORD STIMULATOR BATTERY   SPINAL CORD STIMULATOR INSERTION N/A 03/30/2023  Procedure: SPINAL CORD STIMULATOR INSERTION;  Surgeon: Burnetta Aures, MD;  Location: [facility] OR;  Service: Orthopedics;  Laterality: N/A;  3 C-Bed   TOTAL KNEE ARTHROPLASTY Left 04/04/2019   Procedure: TOTAL KNEE ARTHROPLASTY;  Surgeon: Melodi Lerner, MD;  Location: [facility] ORS;  Service: Orthopedics;  Laterality: Left;    TOTAL KNEE ARTHROPLASTY Right 04/30/2020   Procedure: TOTAL KNEE ARTHROPLASTY;  Surgeon: Melodi Lerner, MD;  Location: [facility] ORS;  Service: Orthopedics;  Laterality: Right;    TUBAL LIGATION      Allergies  Allergen Reactions   Dorethia Sit ] Hives and Shortness Of Breath    Chest tightness     Penicillins Hives     Tolerated Cephalosporin Date: 05/01/20.     Sulfa Antibiotics     G6PD deficiency     Immunization History  Administered Date(s) Administered   Influenza Split 03/23/2014   Influenza,inj,Quad PF,6+ Mos 03/15/2016, 06/08/2017, 03/08/2018, 02/17/2019, 03/05/2021, 03/18/2022   Influenza-Unspecified 04/03/2020   Moderna Sars-Covid-2 Vaccination 09/20/2019, 10/06/2019   PFIZER Comirnaty(Gray Top)Covid-19 Tri-Sucrose Vaccine 11/05/2019, 05/30/2020   PFIZER(Purple Top)SARS-COV-2 Vaccination 10/06/2019, 11/05/2019   Pfizer Covid-19 Vaccine Bivalent Booster 23yrs & up 04/03/2021   Pneumococcal-Unspecified 08/23/2014    Family History  Problem Relation Age of Onset   Sarcoidosis Mother    Lupus Sister    Healthy Daughter    Healthy Son    Healthy Son      Current Outpatient Medications:    albuterol  (VENTOLIN  HFA) 108 (90 Base) MCG/ACT inhaler, Inhale 2 puffs into the lungs every 6 (six) hours as needed for wheezing or shortness of breath., Disp: 1 each, Rfl: 5   amitriptyline  (ELAVIL ) 100 MG tablet, Take 100 mg by mouth at bedtime., Disp: , Rfl:    BREZTRI  AEROSPHERE 160-9-4.8 MCG/ACT AERO inhaler, INHALE 2 PUFFS IN THE MORNING AND AT BEDTIME, Disp: 11 g, Rfl: 0   buPROPion  (WELLBUTRIN  XL) 300 MG 24 hr tablet, Take 300 mg by mouth daily., Disp: , Rfl:    busPIRone  (BUSPAR ) 15 MG tablet, Take 15 mg by mouth daily., Disp: , Rfl:    cetirizine  (ZYRTEC ) 10 MG tablet, TAKE 1 TABLET BY MOUTH AT BEDTIME, Disp: 30 tablet, Rfl: 11   cloNIDine  (CATAPRES ) 0.1 MG tablet, Take 0.1 mg by mouth at bedtime., Disp: , Rfl:    famotidine  (PEPCID ) 20 MG tablet, Take 20 mg by mouth 2 (two) times daily., Disp: , Rfl:    hydroxychloroquine  (PLAQUENIL ) 200 MG tablet, Take 400 mg by mouth at bedtime., Disp: , Rfl:    Magnesium  250 MG CAPS, Take 1 capsule by mouth daily., Disp: , Rfl:    metoprolol  succinate (TOPROL -XL) 25 MG 24 hr tablet, Take 25 mg by mouth daily., Disp: , Rfl:     montelukast  (SINGULAIR ) 10 MG tablet, TAKE 1 TABLET BY MOUTH AT BEDTIME, Disp: 30 tablet, Rfl: 11   mycophenolate  (CELLCEPT ) 500 MG tablet, Take 1,000 mg by mouth 2 (two) times daily., Disp: , Rfl:    MYRBETRIQ  25 MG TB24 tablet, Take 25 mg by mouth daily., Disp: , Rfl:    omeprazole (PRILOSEC) 20 MG capsule, Take 20 mg by mouth daily., Disp: , Rfl:    Oxycodone  HCl 10 MG TABS, Take 10 mg by mouth 4 (four) times daily as needed (Pain)., Disp: , Rfl:    QUEtiapine  (SEROQUEL  XR) 300 MG 24 hr tablet, Take 300 mg by mouth at bedtime., Disp: , Rfl:    sertraline  (ZOLOFT ) 100 MG tablet, Take 200 mg by mouth at  bedtime., Disp: , Rfl: 0   VEOZAH  45 MG TABS, Take 1 tablet by mouth daily., Disp: , Rfl:       Objective:   Vitals:   02/25/24 1442  BP: 130/78  Pulse: (!) 103  SpO2: 97%  Weight: (!) 303 lb (137.4 kg)  Height: 5' 9.48 (1.765 m)    Estimated body mass index is 44.13 kg/m as calculated from the following:   Height as of this encounter: 5' 9.48 (1.765 m).   Weight as of this encounter: 303 lb (137.4 kg).  @WEIGHTCHANGE @  Filed Weights   02/25/24 1442  Weight: (!) 303 lb (137.4 kg)     Physical Exam   General: No distress. Looks well O2 at rest: no Cane present: no Sitting in wheel chair: no Frail: no Obese: no Neuro: Alert and Oriented x 3. GCS 15. Speech normal Psych: Pleasant Resp:  Barrel Chest - no.  Wheeze - no, Crackles - no, No overt respiratory distress CVS: Normal heart sounds. Murmurs - no Ext: Stigmata of Connective Tissue Disease - no HEENT: Normal upper airway. PEERL +. No post nasal drip        Assessment/     Assessment & Plan ILD (interstitial lung disease) (HCC)  Positive QuantiFERON-TB Gold test    PLAN Patient Instructions  Interstitial lung disease due to connective tissue disease (HCC) High risk medication use Immunosuppressed status  Pulmonary function test WAS showing/suggesting continued decline sept 2022-> June 2025 but on  02/25/2024 bounced back and is better than Nov 2024 but still worse than 2022  This suggests =- stable x 1 year but likely worse over 3 years   Plan ' -Continue CellCept  through the [facility]ologist at [facility] -Continue Plaquenil  through the rheumatologist at [facility] -Noted that you are off prednisone  since 2021  -If pulmonary fibrosis gets worse then we can add nintedanib -Get  HRCT in  6months    Quantiferon Gold Positive Aug 2025 at [facility]  - per Public health depatent  History of snoring Witnessed apneic spells OSA    - glad you are seeing Dr MALVA and are on CPAP  Plan  - per Dr MALVA    Follow-up -Return to see Dr. Geronimo 15-minute ILD slot in 6 months after HRCT  - symptoms score and walk test in 4 months    FOLLOWUP    No follow-ups on file.    SIGNATURE    Dr. Dorethia Geronimo, M.D., F.C.C.P,  Pulmonary and Critical Care Medicine Staff Physician, [facility] Center Director - Interstitial Lung Disease  Program  Pulmonary Fibrosis [facility] at [facility] [place], [place], [address]  Pager: [phone], If no answer or between  15:00h - 7:00h: call [phone] Telephone: [phone]  3:10 PM 02/25/2024

## 2024-02-24 NOTE — Patient Instructions (Incomplete)
 Interstitial lung disease due to connective tissue disease (HCC) High risk medication use Immunosuppressed status  Pulmonary function test is showing/suggesting continued decline. Although this is not clear 1 where the other on the CT scan of the chest. Currently noticed priority is for her to get back surgery   Plan ' -Continue CellCept  through the rheumatologist at [facility] -Continue Plaquenil  through the rheumatologist at [facility] -Noted that you are off prednisone  since 2021  -If pulmonary fibrosis gets worse then we can add nintedanib -Get spirometry and DLCO in 3 months     History of snoring Witnessed apneic spells OSA    - glad you are seeing Dr MALVA and are on CPAP  Plan  - per Dr MALVA   Preoperative respiratory exam- NE ISSUEs  - Low-moderate risk for complications from back surgery -Risks include pulmonary fibrosis flareup and blood clots  Plan - Routine postoperative recovery programs   Follow-up -Return to see Dr. Geronimo 15-minute ILD slot in 3-4 months but after PFT, Spiro and dlco  - symptoms score and walk test in 4 months

## 2024-02-25 ENCOUNTER — Ambulatory Visit (INDEPENDENT_AMBULATORY_CARE_PROVIDER_SITE_OTHER): Admitting: Internal Medicine

## 2024-02-25 ENCOUNTER — Encounter: Payer: Self-pay | Admitting: Internal Medicine

## 2024-02-25 ENCOUNTER — Ambulatory Visit
Admission: EM | Admit: 2024-02-25 | Discharge: 2024-02-25 | Disposition: A | Discharge status: Home / Self Care | Attending: Family Medicine | Admitting: Family Medicine

## 2024-02-25 VITALS — BP 130/78 | HR 103 | Ht 69.48 in | Wt 303.0 lb

## 2024-02-25 DIAGNOSIS — G4733 Obstructive sleep apnea (adult) (pediatric): Secondary | ICD-10-CM | POA: Diagnosis not present

## 2024-02-25 DIAGNOSIS — M359 Systemic involvement of connective tissue, unspecified: Secondary | ICD-10-CM | POA: Diagnosis not present

## 2024-02-25 DIAGNOSIS — J8489 Other specified interstitial pulmonary diseases: Secondary | ICD-10-CM

## 2024-02-25 DIAGNOSIS — R7612 Nonspecific reaction to cell mediated immunity measurement of gamma interferon antigen response without active tuberculosis: Secondary | ICD-10-CM | POA: Diagnosis not present

## 2024-02-25 DIAGNOSIS — N3001 Acute cystitis with hematuria: Secondary | ICD-10-CM | POA: Diagnosis not present

## 2024-02-25 DIAGNOSIS — R300 Dysuria: Secondary | ICD-10-CM | POA: Insufficient documentation

## 2024-02-25 DIAGNOSIS — J849 Interstitial pulmonary disease, unspecified: Secondary | ICD-10-CM | POA: Diagnosis not present

## 2024-02-25 DIAGNOSIS — Z01811 Encounter for preprocedural respiratory examination: Secondary | ICD-10-CM

## 2024-02-25 LAB — PULMONARY FUNCTION TEST
DL/VA % pred: 112 %
DL/VA: 4.66 ml/min/mmHg/L
DLCO cor % pred: 63 %
DLCO cor: 16.02 ml/min/mmHg
DLCO unc % pred: 63 %
DLCO unc: 16.02 ml/min/mmHg
FEF 25-75 Pre: 3.55 L/s
FEF2575-%Pred-Pre: 116 %
FEV1-%Pred-Pre: 60 %
FEV1-Pre: 2.02 L
FEV1FVC-%Pred-Pre: 112 %
FEV6-%Pred-Pre: 54 %
FEV6-Pre: 2.26 L
FEV6FVC-%Pred-Pre: 102 %
FVC-%Pred-Pre: 53 %
FVC-Pre: 2.26 L
Pre FEV1/FVC ratio: 90 %
Pre FEV6/FVC Ratio: 100 %

## 2024-02-25 LAB — POCT URINE DIPSTICK
Bilirubin, UA: NEGATIVE
Glucose, UA: NEGATIVE mg/dL
Nitrite, UA: NEGATIVE
POC PROTEIN,UA: 100 — AB
Spec Grav, UA: >=1.03 — AB (ref 1.010–1.025)
Urobilinogen, UA: 0.2 U/dL
pH, UA: 6 (ref 5.0–8.0)

## 2024-02-25 MED ORDER — NITROFURANTOIN MONOHYD MACRO 100 MG PO CAPS: 100.0000 mg | ORAL_CAPSULE | Freq: Two times a day (BID) | ORAL | 0 refills | Status: DC

## 2024-02-25 NOTE — Progress Notes (Signed)
 Spirometry and diffusion capacity performed today.

## 2024-02-25 NOTE — Discharge Instructions (Addendum)
Please start Macrobid to address an urinary tract infection. Make sure you hydrate very well with plain water and a quantity of 80 ounces of water a day.  Please limit drinks that are considered urinary irritants such as soda, sweet tea, coffee, energy drinks, alcohol.  These can worsen your urinary and genital symptoms but also be the source of them.  I will let you know about your urine culture results through MyChart to see if we need to prescribe or change your antibiotics based off of those results.  

## 2024-02-25 NOTE — ED Provider Notes (Signed)
 [facility]  Note:  This document was prepared using Conservation officer, historic buildings and may include unintentional dictation errors.  MRN: [id number] DOB: 05-09-1971  Subjective:   MAGGIE SENSENEY is a 53 y.o. female presenting for 1.5 week history of dysuria, urinary frequency, intermittent pelvic cramps. Drinks water  regularly, does 3 bottles. Stopped drinking sodas. Has sweet tea daily. No concern for STI.   No current facility-administered medications for this encounter.  Current Outpatient Medications:    albuterol  (VENTOLIN  HFA) 108 (90 Base) MCG/ACT inhaler, Inhale 2 puffs into the lungs every 6 (six) hours as needed for wheezing or shortness of breath., Disp: 1 each, Rfl: 5   amitriptyline  (ELAVIL ) 100 MG tablet, Take 100 mg by mouth at bedtime., Disp: , Rfl:    BREZTRI  AEROSPHERE 160-9-4.8 MCG/ACT AERO inhaler, INHALE 2 PUFFS IN THE MORNING AND AT BEDTIME, Disp: 11 g, Rfl: 0   buPROPion  (WELLBUTRIN  XL) 300 MG 24 hr tablet, Take 300 mg by mouth daily., Disp: , Rfl:    busPIRone  (BUSPAR ) 15 MG tablet, Take 15 mg by mouth daily., Disp: , Rfl:    cetirizine  (ZYRTEC ) 10 MG tablet, TAKE 1 TABLET BY MOUTH AT BEDTIME, Disp: 30 tablet, Rfl: 11   cloNIDine  (CATAPRES ) 0.1 MG tablet, Take 0.1 mg by mouth at bedtime., Disp: , Rfl:    famotidine  (PEPCID ) 20 MG tablet, Take 20 mg by mouth 2 (two) times daily., Disp: , Rfl:    hydroxychloroquine  (PLAQUENIL ) 200 MG tablet, Take 400 mg by mouth at bedtime., Disp: , Rfl:    Magnesium  250 MG CAPS, Take 1 capsule by mouth daily., Disp: , Rfl:    metoprolol  succinate (TOPROL -XL) 25 MG 24 hr tablet, Take 25 mg by mouth daily., Disp: , Rfl:    montelukast  (SINGULAIR ) 10 MG tablet, TAKE 1 TABLET BY MOUTH AT BEDTIME, Disp: 30 tablet, Rfl: 11   mycophenolate  (CELLCEPT ) 500 MG tablet, Take 1,000 mg by mouth 2 (two) times daily., Disp: , Rfl:    MYRBETRIQ  25 MG TB24 tablet, Take 25 mg by mouth daily., Disp: , Rfl:    omeprazole  (PRILOSEC) 20 MG capsule, Take 20 mg by mouth daily., Disp: , Rfl:    Oxycodone  HCl 10 MG TABS, Take 10 mg by mouth 4 (four) times daily as needed (Pain)., Disp: , Rfl:    QUEtiapine  (SEROQUEL  XR) 300 MG 24 hr tablet, Take 300 mg by mouth at bedtime., Disp: , Rfl:    sertraline  (ZOLOFT ) 100 MG tablet, Take 200 mg by mouth at bedtime., Disp: , Rfl: 0   VEOZAH  45 MG TABS, Take 1 tablet by mouth daily., Disp: , Rfl:    Allergies  Allergen Reactions   Dorethia Shoals ] Hives and Shortness Of Breath    Chest tightness    Penicillins Hives     Tolerated Cephalosporin Date: 05/01/20.     Sulfa Antibiotics     G6PD deficiency     Past Medical History:  Diagnosis Date   Anemia    Anxiety    Bilateral swelling of feet    Bronchitis    DDD (degenerative disc disease), lumbosacral    Depression    Dilated cardiomyopathy (HCC)    Dyspnea    Sometimes at rest   G6PD deficiency    GERD (gastroesophageal reflux disease)    Hypertension    ILD (interstitial lung disease) (HCC)    Lactose intolerance    Lupus    Osteoarthritis    Palpitations  Pneumonia    Rheumatoid arthritis (HCC)    SLE (systemic lupus erythematosus) (HCC)    Sleep apnea      Past Surgical History:  Procedure Laterality Date   ABDOMINAL HYSTERECTOMY     APPENDECTOMY  1980   cardiac catherization  08/20/2017   CARDIAC CATHETERIZATION N/A 05/10/2015   Procedure: Right Heart Cath;  Surgeon: Ezra GORMAN Shuck, MD;  Location: [facility] INVASIVE CV LAB;  Service: Cardiovascular;  Laterality: N/A;   CESAREAN SECTION  '95, '02, '07   X 3   CHOLECYSTECTOMY  2010   LAPAROSCOPIC HYSTERECTOMY  10/2015   have ovaries   RIGHT HEART CATH N/A 08/20/2017   Procedure: RIGHT HEART CATH;  Surgeon: Shuck Ezra GORMAN, MD;  Location: [facility] INVASIVE CV LAB;  Service: Cardiovascular;  Laterality: N/A;   SHOULDER ARTHROSCOPY WITH ROTATOR CUFF REPAIR AND SUBACROMIAL DECOMPRESSION Left 08/13/2022   Procedure: SHOULDER ARTHROSCOPY WITH SUBACROMIAL  DECOMPRESSION, DEBRIDEMENT AND PARTIAL ROTATOR CUFF REPAIR;  Surgeon: Duwayne Purchase, MD;  Location: [facility];  Service: Orthopedics;  Laterality: Left;   SHOULDER ARTHROSCOPY WITH SUBACROMIAL DECOMPRESSION Right 08/28/2021   Procedure: SHOULDER ARTHROSCOPY WITH SUBACROMIAL DECOMPRESSION AND DEBRIDEMENT;  Surgeon: Duwayne Purchase, MD;  Location: [facility];  Service: Orthopedics;  Laterality: Right;   SPINAL CORD STIMULATOR BATTERY EXCHANGE N/A 12/21/2023   Procedure: SPINAL CORD STIMULATOR BATTERY EXCHANGE;  Surgeon: Burnetta Aures, MD;  Location: [facility] OR;  Service: Orthopedics;  Laterality: N/A;  REPOSITION SPINAL CORD STIMULATOR BATTERY   SPINAL CORD STIMULATOR INSERTION N/A 03/30/2023   Procedure: SPINAL CORD STIMULATOR INSERTION;  Surgeon: Burnetta Aures, MD;  Location: [facility];  Service: Orthopedics;  Laterality: N/A;  3 C-Bed   TOTAL KNEE ARTHROPLASTY Left 04/04/2019   Procedure: TOTAL KNEE ARTHROPLASTY;  Surgeon: Melodi Lerner, MD;  Location: [facility];  Service: Orthopedics;  Laterality: Left;    TOTAL KNEE ARTHROPLASTY Right 04/30/2020   Procedure: TOTAL KNEE ARTHROPLASTY;  Surgeon: Melodi Lerner, MD;  Location: [facility];  Service: Orthopedics;  Laterality: Right;    TUBAL LIGATION      Family History  Problem Relation Age of Onset   Sarcoidosis Mother    Lupus Sister    Healthy Daughter    Healthy Son    Healthy Son     Social History   Tobacco Use   Smoking status: Former    Current packs/day: 0.00    Average packs/day: 0.5 packs/day for 14.0 years (7.0 ttl pk-yrs)    Types: Cigarettes    Start date: 09/24/2000    Quit date: 09/25/2014    Years since quitting: 9.4   Smokeless tobacco: Never  Vaping Use   Vaping status: Never Used  Substance Use Topics   Alcohol use: Yes    Alcohol/week: 0.0 standard drinks of alcohol    Comment: occas.   Drug use: No    ROS   Objective:   Vitals: BP 138/87 (BP Location: Left Arm)   Pulse (!) 104   Temp 98.4 F (36.9 C) (Oral)    Resp 20   LMP 04/28/2015   SpO2 98%   Physical Exam Constitutional:      General: She is not in acute distress.    Appearance: Normal appearance. She is well-developed. She is not ill-appearing, toxic-appearing or diaphoretic.  HENT:     Head: Normocephalic and atraumatic.     Nose: Nose normal.     Mouth/Throat:     Mouth: Mucous membranes are moist.     Pharynx: Oropharynx is clear.  Eyes:     General: No scleral icterus.       Right eye: No discharge.        Left eye: No discharge.     Extraocular Movements: Extraocular movements intact.     Conjunctiva/sclera: Conjunctivae normal.  Cardiovascular:     Rate and Rhythm: Normal rate.  Pulmonary:     Effort: Pulmonary effort is normal.  Abdominal:     General: Bowel sounds are normal. There is no distension.     Palpations: Abdomen is soft. There is no mass.     Tenderness: There is no abdominal tenderness. There is no right CVA tenderness, left CVA tenderness, guarding or rebound.  Skin:    General: Skin is warm and dry.  Neurological:     General: No focal deficit present.     Mental Status: She is alert and oriented to person, place, and time.  Psychiatric:        Mood and Affect: Mood normal.        Behavior: Behavior normal.        Thought Content: Thought content normal.        Judgment: Judgment normal.     Results for orders placed or performed during the hospital encounter of 02/25/24 (from the past 24 hours)  POCT URINE DIPSTICK     Status: Abnormal   Collection Time: 02/25/24  7:21 PM  Result Value Ref Range   Color, UA yellow yellow   Clarity, UA cloudy (A) clear   Glucose, UA negative negative mg/dL   Bilirubin, UA negative negative   Ketones, POC UA trace (5) (A) negative mg/dL   Spec Grav, UA >=8.969 (A) 1.010 - 1.025   Blood, UA moderate (A) negative   pH, UA 6.0 5.0 - 8.0   POC PROTEIN,UA =100 (A) negative, trace   Urobilinogen, UA 0.2 0.2 or 1.0 E.U./dL   Nitrite, UA Negative Negative    Leukocytes, UA Small (1+) (A) Negative    Assessment and Plan :   PDMP not reviewed this encounter.  1. Acute cystitis with hematuria   2. Dysuria      Start Macrobid  to cover for acute cystitis, urine culture pending.  Recommended aggressive hydration, limiting urinary irritants. Counseled patient on potential for adverse effects with medications prescribed/recommended today, ER and return-to-clinic precautions discussed, patient verbalized understanding.    Christopher Savannah, [place] 02/25/24 1929

## 2024-02-25 NOTE — ED Triage Notes (Signed)
 Pt c/o dysuria, freq and intermittent abd cramps x 1.5 weeks-NAD-steady gait

## 2024-02-25 NOTE — Patient Instructions (Signed)
 Spirometry and diffusion capacity performed today.

## 2024-02-26 LAB — URINE CULTURE: Culture: 60000 — AB

## 2024-02-29 ENCOUNTER — Ambulatory Visit (HOSPITAL_COMMUNITY): Payer: Self-pay

## 2024-02-29 ENCOUNTER — Ambulatory Visit: Admitting: Internal Medicine

## 2024-03-03 ENCOUNTER — Ambulatory Visit
Admission: RE | Admit: 2024-03-03 | Discharge: 2024-03-03 | Disposition: A | Discharge status: Home / Self Care | Source: Ambulatory Visit | Attending: Emergency Medicine | Admitting: Emergency Medicine

## 2024-03-03 VITALS — BP 136/89 | HR 94 | Temp 97.9°F | Resp 17 | Ht 71.0 in | Wt 292.0 lb

## 2024-03-03 DIAGNOSIS — L02212 Cutaneous abscess of back [any part, except buttock]: Secondary | ICD-10-CM | POA: Diagnosis not present

## 2024-03-03 MED ORDER — DOXYCYCLINE HYCLATE 100 MG PO CAPS: 100.0000 mg | ORAL_CAPSULE | Freq: Two times a day (BID) | ORAL | 0 refills | Status: AC

## 2024-03-03 MED ORDER — IBUPROFEN 800 MG PO TABS: 800.0000 mg | ORAL_TABLET | Freq: Three times a day (TID) | ORAL | 0 refills | Status: AC

## 2024-03-03 NOTE — ED Triage Notes (Signed)
 Pt states that she has a abscess on the upper right part of her back. X2 days

## 2024-03-03 NOTE — ED Provider Notes (Signed)
 [facility]    CSN: [id number] Arrival date & time: 03/03/24  1119      History   Chief Complaint Chief Complaint  Patient presents with   Abscess    Hard knot on upper back - Entered by patient    HPI Shannon [name] is a 53 y.o. female.  Here with concerns for abscess on her right upper back Feels a hard knot there, tender. Not draining No fevers  Has used ibuprofen  600 mg   History of abscess  Past Medical History:  Diagnosis Date   Anemia    Anxiety    Bilateral swelling of feet    Bronchitis    DDD (degenerative disc disease), lumbosacral    Depression    Dilated cardiomyopathy (HCC)    Dyspnea    Sometimes at rest   G6PD deficiency    GERD (gastroesophageal reflux disease)    Hypertension    ILD (interstitial lung disease) (HCC)    Lactose intolerance    Lupus    Osteoarthritis    Palpitations    Pneumonia    Rheumatoid arthritis (HCC)    SLE (systemic lupus erythematosus) (HCC)    Sleep apnea     Patient Active Problem List   Diagnosis Date Noted   Retained orthopedic hardware 12/21/2023   Insulin  resistance 08/11/2023   Anxiety and depression 07/28/2023   Hyperglycemia 07/28/2023   Vitamin D  deficiency 07/28/2023   Hypertension    Chronic pain 03/30/2023   Preoperative clearance 06/10/2022   Lumbar spondylosis 03/19/2022   Chronic low back pain 03/06/2022   Spondylosis without myelopathy 03/06/2022   Upper airway cough syndrome 12/13/2021   Degenerative disc disease at L5-S1 level 09/25/2021   Cervical radiculopathy 06/07/2021   Lumbar radiculopathy 11/22/2020   Pain of left hip joint 08/22/2020   Stiffness of right knee 05/03/2020   Primary osteoarthritis of right knee 04/30/2020   Sleep apnea 04/27/2020   Anemia 09/22/2019   Stiffness of left knee 04/08/2019   OA (osteoarthritis) of knee 04/04/2019   Acute bronchitis 02/13/2017   Cough variant asthma  vs uacs  12/17/2016   Morbid obesity (HCC) 12/17/2016   Contracture  of left elbow 11/21/2016   Primary osteoarthritis of both knees 11/21/2016   Pain in right hand 09/09/2016   Pain in left hand 09/09/2016   History of osteoarthritis 09/09/2016   Rheumatoid factor positive 08/01/2016   Laryngopharyngeal reflux (LPR) 08/23/2015   High risk medication use 01/16/2015   Other long term (current) drug therapy 01/16/2015   G6PD deficiency 10/31/2014   Dyspnea 08/23/2014   ILD (interstitial lung disease) (HCC) 08/23/2014   SLE (systemic lupus erythematosus) (HCC) 08/23/2014   Rheumatoid arthritis (HCC) 08/23/2014   Raynaud disease 08/08/2014    Past Surgical History:  Procedure Laterality Date   ABDOMINAL HYSTERECTOMY     APPENDECTOMY  1980   cardiac catherization  08/20/2017   CARDIAC CATHETERIZATION N/A 05/10/2015   Procedure: Right Heart Cath;  Surgeon: Ezra GORMAN Shuck, MD;  Location: [facility] INVASIVE CV LAB;  Service: Cardiovascular;  Laterality: N/A;   CESAREAN SECTION  '95, '02, '07   X 3   CHOLECYSTECTOMY  2010   LAPAROSCOPIC HYSTERECTOMY  10/2015   have ovaries   RIGHT HEART CATH N/A 08/20/2017   Procedure: RIGHT HEART CATH;  Surgeon: Shuck Ezra GORMAN, MD;  Location: [facility] INVASIVE CV LAB;  Service: Cardiovascular;  Laterality: N/A;   SHOULDER ARTHROSCOPY WITH ROTATOR CUFF REPAIR AND SUBACROMIAL DECOMPRESSION Left 08/13/2022  Procedure: SHOULDER ARTHROSCOPY WITH SUBACROMIAL DECOMPRESSION, DEBRIDEMENT AND PARTIAL ROTATOR CUFF REPAIR;  Surgeon: Duwayne Purchase, MD;  Location: [facility];  Service: Orthopedics;  Laterality: Left;   SHOULDER ARTHROSCOPY WITH SUBACROMIAL DECOMPRESSION Right 08/28/2021   Procedure: SHOULDER ARTHROSCOPY WITH SUBACROMIAL DECOMPRESSION AND DEBRIDEMENT;  Surgeon: Duwayne Purchase, MD;  Location: [facility];  Service: Orthopedics;  Laterality: Right;   SPINAL CORD STIMULATOR BATTERY EXCHANGE N/A 12/21/2023   Procedure: SPINAL CORD STIMULATOR BATTERY EXCHANGE;  Surgeon: Burnetta Aures, MD;  Location: [facility];  Service: Orthopedics;  Laterality: N/A;   REPOSITION SPINAL CORD STIMULATOR BATTERY   SPINAL CORD STIMULATOR INSERTION N/A 03/30/2023   Procedure: SPINAL CORD STIMULATOR INSERTION;  Surgeon: Burnetta Aures, MD;  Location: [facility];  Service: Orthopedics;  Laterality: N/A;  3 C-Bed   TOTAL KNEE ARTHROPLASTY Left 04/04/2019   Procedure: TOTAL KNEE ARTHROPLASTY;  Surgeon: Melodi Lerner, MD;  Location: [facility];  Service: Orthopedics;  Laterality: Left;    TOTAL KNEE ARTHROPLASTY Right 04/30/2020   Procedure: TOTAL KNEE ARTHROPLASTY;  Surgeon: Melodi Lerner, MD;  Location: [facility];  Service: Orthopedics;  Laterality: Right;    TUBAL LIGATION      OB History     Gravida  3   Para      Term      Preterm      AB      Living         SAB      IAB      Ectopic      Multiple      Live Births               Home Medications    Prior to Admission medications   Medication Sig Start Date End Date Taking? Authorizing Provider  albuterol  (VENTOLIN  HFA) 108 (90 Base) MCG/ACT inhaler Inhale 2 puffs into the lungs every 6 (six) hours as needed for wheezing or shortness of breath. 11/04/22  Yes Geronimo Amel, MD  amitriptyline  (ELAVIL ) 100 MG tablet Take 100 mg by mouth at bedtime. 05/28/22  Yes [provider]  BREZTRI  AEROSPHERE 160-9-4.8 MCG/ACT AERO inhaler INHALE 2 PUFFS IN THE MORNING AND AT BEDTIME 02/16/24  Yes Hope Almarie ORN, NP  buPROPion  (WELLBUTRIN  XL) 300 MG 24 hr tablet Take 300 mg by mouth daily.   Yes [provider]  busPIRone  (BUSPAR ) 15 MG tablet Take 15 mg by mouth daily.   Yes [provider]  cetirizine  (ZYRTEC ) 10 MG tablet TAKE 1 TABLET BY MOUTH AT BEDTIME 04/07/23  Yes Hope Almarie ORN, NP  cloNIDine  (CATAPRES ) 0.1 MG tablet Take 0.1 mg by mouth at bedtime. 11/10/23  Yes [provider]  doxycycline  (VIBRAMYCIN ) 100 MG capsule Take 1 capsule (100 mg total) by mouth 2 (two) times daily for 7 days. 03/03/24 03/10/24 Yes Malory Spurr, [place], PA-C  famotidine   (PEPCID ) 20 MG tablet Take 20 mg by mouth 2 (two) times daily. 11/03/23  Yes [provider]  hydroxychloroquine  (PLAQUENIL ) 200 MG tablet Take 400 mg by mouth at bedtime.   Yes [provider]  ibuprofen  (ADVIL ) 800 MG tablet Take 1 tablet (800 mg total) by mouth 3 (three) times daily. 03/03/24  Yes Seylah Wernert, [place], PA-C  Magnesium  250 MG CAPS Take 1 capsule by mouth daily.   Yes [provider]  metoprolol  succinate (TOPROL -XL) 25 MG 24 hr tablet Take 25 mg by mouth daily. 03/07/23  Yes [provider]  montelukast  (SINGULAIR ) 10 MG tablet TAKE 1 TABLET BY MOUTH  AT BEDTIME 12/20/23  Yes Geronimo Amel, MD  mycophenolate  (CELLCEPT ) 500 MG tablet Take 1,000 mg by mouth 2 (two) times daily.   Yes [provider]  MYRBETRIQ  25 MG TB24 tablet Take 25 mg by mouth daily.   Yes [provider]  omeprazole (PRILOSEC) 20 MG capsule Take 20 mg by mouth daily.   Yes [provider]  Oxycodone  HCl 10 MG TABS Take 10 mg by mouth 4 (four) times daily as needed (Pain). 07/23/23  Yes [provider]  QUEtiapine  (SEROQUEL  XR) 300 MG 24 hr tablet Take 300 mg by mouth at bedtime.   Yes [provider]  sertraline  (ZOLOFT ) 100 MG tablet Take 200 mg by mouth at bedtime. 02/23/18  Yes [provider]  VEOZAH  45 MG TABS Take 1 tablet by mouth daily. 07/24/23  Yes [provider]    Family History Family History  Problem Relation Age of Onset   Sarcoidosis Mother    Lupus Sister    Healthy Daughter    Healthy Son    Healthy Son     Social History Social History   Tobacco Use   Smoking status: Former    Current packs/day: 0.00    Average packs/day: 0.5 packs/day for 14.0 years (7.0 ttl pk-yrs)    Types: Cigarettes    Start date: 09/24/2000    Quit date: 09/25/2014    Years since quitting: 9.4   Smokeless tobacco: Never  Vaping Use   Vaping status: Never Used  Substance Use Topics   Alcohol use: Not Currently     Comment: occas.   Drug use: No     Allergies   Asa [aspirin ], Penicillins, and Sulfa antibiotics   Review of Systems Review of Systems   Physical Exam Triage Vital Signs ED Triage Vitals  Encounter Vitals Group     BP 03/03/24 1135 136/89     Girls Systolic BP Percentile --      Girls Diastolic BP Percentile --      Boys Systolic BP Percentile --      Boys Diastolic BP Percentile --      Pulse Rate 03/03/24 1135 99     Resp 03/03/24 1135 17     Temp 03/03/24 1135 97.9 F (36.6 C)     Temp Source 03/03/24 1135 Oral     SpO2 03/03/24 1135 95 %     Weight 03/03/24 1134 292 lb (132.5 kg)     Height 03/03/24 1134 5' 11 (1.803 m)     Head Circumference --      Peak Flow --      Pain Score 03/03/24 1134 6     Pain Loc --      Pain Education --      Exclude from Growth Chart --    No data found.  Updated Vital Signs BP 136/89 (BP Location: Left Arm)   Pulse 94   Temp 97.9 F (36.6 C) (Oral)   Resp 17   Ht 5' 11 (1.803 m)   Wt 292 lb (132.5 kg)   LMP 04/28/2015   SpO2 95%   BMI 40.73 kg/m   Visual Acuity Right Eye Distance:   Left Eye Distance:   Bilateral Distance:    Right Eye Near:   Left Eye Near:    Bilateral Near:     Physical Exam Vitals and nursing note reviewed.  Constitutional:      General: She is not in acute distress.    Appearance:  Normal appearance.  HENT:     Mouth/Throat:     Mouth: Mucous membranes are moist.     Pharynx: Oropharynx is clear.  Cardiovascular:     Rate and Rhythm: Normal rate and regular rhythm.     Heart sounds: Normal heart sounds.  Pulmonary:     Effort: Pulmonary effort is normal.     Breath sounds: Normal breath sounds.  Skin:    Findings: Abscess present.         Comments: Area of induration about 2 inch diameter right upper back. Tender. No fluctuance, erythema, drainage.   Neurological:     Mental Status: She is alert and oriented to person, place, and time.      UC Treatments / Results   Labs (all labs ordered are listed, but only abnormal results are displayed) Labs Reviewed - No data to display  EKG   Radiology No results found.  Procedures Procedures (including critical care time)  Medications Ordered in UC Medications - No data to display  Initial Impression / Assessment and Plan / UC Course  I have reviewed the triage vital signs and the nursing notes.  Pertinent labs & imaging results that were available during my care of the patient were reviewed by me and considered in my medical decision making (see chart for details).  Aspirin  allergy in patient chart.  I have verified with her that she takes ibuprofen /Advil /Motrin  without any issues.  Area of induration and tenderness, likely abscess. Not fluctuant, cannot be drained in clinic at this time Doxycycline  BID x 7 days Other supportive care Return precautions Agrees to plan, no questions at this time   Final Clinical Impressions(s) / UC Diagnoses   Final diagnoses:  Abscess of back     Discharge Instructions      Doxycycline  twice daily for 7 days Take with food to avoid upset stomach. Finish all the pills  Warm compress/heating pad  Ibuprofen  can be taken 1 tablet every 6 hours as needed      ED Prescriptions     Medication Sig Dispense Auth. Provider   ibuprofen  (ADVIL ) 800 MG tablet Take 1 tablet (800 mg total) by mouth 3 (three) times daily. 21 tablet Verdun Rackley, PA-C   doxycycline  (VIBRAMYCIN ) 100 MG capsule Take 1 capsule (100 mg total) by mouth 2 (two) times daily for 7 days. 14 capsule Ranay Ketter, [place], PA-C      PDMP not reviewed this encounter.   Saahir Prude, PA-C 03/03/24 1212

## 2024-03-03 NOTE — Discharge Instructions (Signed)
 Doxycycline  twice daily for 7 days Take with food to avoid upset stomach. Finish all the pills  Warm compress/heating pad  Ibuprofen  can be taken 1 tablet every 6 hours as needed

## 2024-03-07 DIAGNOSIS — M47816 Spondylosis without myelopathy or radiculopathy, lumbar region: Secondary | ICD-10-CM | POA: Diagnosis not present

## 2024-03-09 ENCOUNTER — Ambulatory Visit
Admission: RE | Admit: 2024-03-09 | Discharge: 2024-03-09 | Disposition: A | Discharge status: Home / Self Care | Source: Ambulatory Visit | Attending: Family Medicine | Admitting: Family Medicine

## 2024-03-09 VITALS — BP 135/88 | HR 112 | Temp 98.8°F | Resp 16

## 2024-03-09 DIAGNOSIS — L089 Local infection of the skin and subcutaneous tissue, unspecified: Secondary | ICD-10-CM

## 2024-03-09 DIAGNOSIS — L723 Sebaceous cyst: Secondary | ICD-10-CM

## 2024-03-09 HISTORY — DX: Respiratory tuberculosis unspecified: A15.9

## 2024-03-09 MED ORDER — CLINDAMYCIN HCL 300 MG PO CAPS: 300.0000 mg | ORAL_CAPSULE | Freq: Three times a day (TID) | ORAL | 0 refills | Status: AC

## 2024-03-09 NOTE — ED Provider Notes (Signed)
 [facility]  Note:  This document was prepared using Conservation officer, historic buildings and may include unintentional dictation errors.  MRN: [id number] DOB: [birth year]  Subjective:   Shannon [name] is a 53 y.o. female presenting for 1 to 2-week history of persistent pain over the thoracic back on the right side.  Patient has had a draining cyst for many years.  Has never had a problem with it up until the last 2 weeks.  She just finished doxycycline  and feels some better.  No current facility-administered medications for this encounter.  Current Outpatient Medications:    isoniazid  (NYDRAZID) 300 MG tablet, Take by mouth daily., Disp: , Rfl:    pyridOXINE (VITAMIN B6) 25 MG tablet, Take 25 mg by mouth daily., Disp: , Rfl:    rifampin (RIFADIN) 150 MG capsule, Take 150 mg by mouth daily., Disp: , Rfl:    albuterol  (VENTOLIN  HFA) 108 (90 Base) MCG/ACT inhaler, Inhale 2 puffs into the lungs every 6 (six) hours as needed for wheezing or shortness of breath., Disp: 1 each, Rfl: 5   amitriptyline  (ELAVIL ) 100 MG tablet, Take 100 mg by mouth at bedtime., Disp: , Rfl:    BREZTRI  AEROSPHERE 160-9-4.8 MCG/ACT AERO inhaler, INHALE 2 PUFFS IN THE MORNING AND AT BEDTIME, Disp: 11 g, Rfl: 0   buPROPion  (WELLBUTRIN  XL) 300 MG 24 hr tablet, Take 300 mg by mouth daily., Disp: , Rfl:    busPIRone  (BUSPAR ) 15 MG tablet, Take 15 mg by mouth daily., Disp: , Rfl:    cetirizine  (ZYRTEC ) 10 MG tablet, TAKE 1 TABLET BY MOUTH AT BEDTIME, Disp: 30 tablet, Rfl: 11   cloNIDine  (CATAPRES ) 0.1 MG tablet, Take 0.1 mg by mouth at bedtime., Disp: , Rfl:    doxycycline  (VIBRAMYCIN ) 100 MG capsule, Take 1 capsule (100 mg total) by mouth 2 (two) times daily for 7 days., Disp: 14 capsule, Rfl: 0   famotidine  (PEPCID ) 20 MG tablet, Take 20 mg by mouth 2 (two) times daily., Disp: , Rfl:    hydroxychloroquine  (PLAQUENIL ) 200 MG tablet, Take 400 mg by mouth at bedtime., Disp: , Rfl:    ibuprofen  (ADVIL )  800 MG tablet, Take 1 tablet (800 mg total) by mouth 3 (three) times daily., Disp: 21 tablet, Rfl: 0   Magnesium  250 MG CAPS, Take 1 capsule by mouth daily., Disp: , Rfl:    metoprolol  succinate (TOPROL -XL) 25 MG 24 hr tablet, Take 25 mg by mouth daily., Disp: , Rfl:    montelukast  (SINGULAIR ) 10 MG tablet, TAKE 1 TABLET BY MOUTH AT BEDTIME, Disp: 30 tablet, Rfl: 11   mycophenolate  (CELLCEPT ) 500 MG tablet, Take 1,000 mg by mouth 2 (two) times daily., Disp: , Rfl:    MYRBETRIQ  25 MG TB24 tablet, Take 25 mg by mouth daily., Disp: , Rfl:    omeprazole (PRILOSEC) 20 MG capsule, Take 20 mg by mouth daily., Disp: , Rfl:    Oxycodone  HCl 10 MG TABS, Take 10 mg by mouth 4 (four) times daily as needed (Pain)., Disp: , Rfl:    QUEtiapine  (SEROQUEL  XR) 300 MG 24 hr tablet, Take 300 mg by mouth at bedtime., Disp: , Rfl:    sertraline  (ZOLOFT ) 100 MG tablet, Take 200 mg by mouth at bedtime., Disp: , Rfl: 0   VEOZAH  45 MG TABS, Take 1 tablet by mouth daily., Disp: , Rfl:    Allergies  Allergen Reactions   Asa [Aspirin ] Hives and Shortness Of Breath    Chest tightness  Penicillins Hives     Tolerated Cephalosporin Date: 05/01/20.     Sulfa Antibiotics     G6PD deficiency     Past Medical History:  Diagnosis Date   Anemia    Anxiety    Bilateral swelling of feet    Bronchitis    DDD (degenerative disc disease), lumbosacral    Depression    Dilated cardiomyopathy (HCC)    Dyspnea    Sometimes at rest   G6PD deficiency    GERD (gastroesophageal reflux disease)    Hypertension    ILD (interstitial lung disease) (HCC)    Lactose intolerance    Lupus    Osteoarthritis    Palpitations    Pneumonia    Rheumatoid arthritis (HCC)    SLE (systemic lupus erythematosus) (HCC)    Sleep apnea    TB (tuberculosis)    dormant per pt     Past Surgical History:  Procedure Laterality Date   ABDOMINAL HYSTERECTOMY     APPENDECTOMY  1980   cardiac catherization  08/20/2017   CARDIAC  CATHETERIZATION N/A 05/10/2015   Procedure: Right Heart Cath;  Surgeon: Ezra GORMAN Shuck, MD;  Location: [facility] INVASIVE CV LAB;  Service: Cardiovascular;  Laterality: N/A;   CESAREAN SECTION  '95, '02, '07   X 3   CHOLECYSTECTOMY  2010   LAPAROSCOPIC HYSTERECTOMY  10/2015   have ovaries   RIGHT HEART CATH N/A 08/20/2017   Procedure: RIGHT HEART CATH;  Surgeon: Shuck Ezra GORMAN, MD;  Location: [facility] INVASIVE CV LAB;  Service: Cardiovascular;  Laterality: N/A;   SHOULDER ARTHROSCOPY WITH ROTATOR CUFF REPAIR AND SUBACROMIAL DECOMPRESSION Left 08/13/2022   Procedure: SHOULDER ARTHROSCOPY WITH SUBACROMIAL DECOMPRESSION, DEBRIDEMENT AND PARTIAL ROTATOR CUFF REPAIR;  Surgeon: Duwayne Purchase, MD;  Location: [facility]S;  Service: Orthopedics;  Laterality: Left;   SHOULDER ARTHROSCOPY WITH SUBACROMIAL DECOMPRESSION Right 08/28/2021   Procedure: SHOULDER ARTHROSCOPY WITH SUBACROMIAL DECOMPRESSION AND DEBRIDEMENT;  Surgeon: Duwayne Purchase, MD;  Location: [facility];  Service: Orthopedics;  Laterality: Right;   SPINAL CORD STIMULATOR BATTERY EXCHANGE N/A 12/21/2023   Procedure: SPINAL CORD STIMULATOR BATTERY EXCHANGE;  Surgeon: Burnetta Aures, MD;  Location: [facility] OR;  Service: Orthopedics;  Laterality: N/A;  REPOSITION SPINAL CORD STIMULATOR BATTERY   SPINAL CORD STIMULATOR INSERTION N/A 03/30/2023   Procedure: SPINAL CORD STIMULATOR INSERTION;  Surgeon: Burnetta Aures, MD;  Location: [facility] OR;  Service: Orthopedics;  Laterality: N/A;  3 C-Bed   TOTAL KNEE ARTHROPLASTY Left 04/04/2019   Procedure: TOTAL KNEE ARTHROPLASTY;  Surgeon: Melodi Lerner, MD;  Location: [facility];  Service: Orthopedics;  Laterality: Left;    TOTAL KNEE ARTHROPLASTY Right 04/30/2020   Procedure: TOTAL KNEE ARTHROPLASTY;  Surgeon: Melodi Lerner, MD;  Location: [facility];  Service: Orthopedics;  Laterality: Right;    TUBAL LIGATION      Family History  Problem Relation Age of Onset   Sarcoidosis Mother    Lupus Sister    Healthy Daughter    Healthy  Son    Healthy Son     Social History   Tobacco Use   Smoking status: Former    Current packs/day: 0.00    Average packs/day: 0.5 packs/day for 14.0 years (7.0 ttl pk-yrs)    Types: Cigarettes    Start date: 09/24/2000    Quit date: 09/25/2014    Years since quitting: 9.4   Smokeless tobacco: Never  Vaping Use   Vaping status: Never Used  Substance Use Topics   Alcohol use: Yes  Comment: occ   Drug use: No    ROS   Objective:   Vitals: BP 135/88 (BP Location: Left Arm)   Pulse (!) 112   Temp 98.8 F (37.1 C) (Oral)   Resp 16   LMP 04/28/2015   SpO2 94%   Physical Exam Constitutional:      General: She is not in acute distress.    Appearance: Normal appearance. She is well-developed. She is not ill-appearing, toxic-appearing or diaphoretic.  HENT:     Head: Normocephalic and atraumatic.     Nose: Nose normal.     Mouth/Throat:     Mouth: Mucous membranes are moist.  Eyes:     General: No scleral icterus.       Right eye: No discharge.        Left eye: No discharge.     Extraocular Movements: Extraocular movements intact.  Cardiovascular:     Rate and Rhythm: Normal rate.  Pulmonary:     Effort: Pulmonary effort is normal.  Skin:    General: Skin is warm and dry.      Neurological:     General: No focal deficit present.     Mental Status: She is alert and oriented to person, place, and time.  Psychiatric:        Mood and Affect: Mood normal.        Behavior: Behavior normal.    PROCEDURE NOTE: I&D of Abscess Verbal consent obtained. Local anesthesia with 3cc of 2% lidocaine  with epinephrine . Site cleansed with Betadine  and alcohol swabs . Incision of 1/2cm was made using an 11 blade, ~2cc expressed consisting of a mixture of pus and serosanguinous fluid, small amount of cyst material. Wound cavity was explored with curved hemostats and loculations loosened. Cleansed and dressed.   Assessment and Plan :   PDMP not reviewed this encounter.  1.  Infected sebaceous cyst    Successful I&D performed.  Wound care reviewed.  Start clindamycin  for the infected cyst, ibuprofen  for pain and inflammation.  Emphasized need to follow-up with dermatology for definitive removal.  Counseled patient on potential for adverse effects with medications prescribed/recommended today, ER and return-to-clinic precautions discussed, patient verbalized understanding.    Christopher Savannah, [place] 03/09/24 1712

## 2024-03-09 NOTE — ED Triage Notes (Signed)
 Pt c/o possible abscess to back x 1-2 weeks-was seen here for same-completed abx-area is worse-taking tylenol /last dose last night-NAD-steady gait

## 2024-03-09 NOTE — Discharge Instructions (Signed)
Please change your dressing 3-5 times daily. Do not apply any ointments or creams. Each time you change your dressing, make sure that you are pressing on the wound to get pus to come out.  Try your best to clean the wound with antibacterial soap and warm water. Pat your wound dry and let it air out if possible to make sure it is dry before reapplying another dressing.   Start clindamycin for the infection. Use ibuprofen for the pain.   

## 2024-03-15 ENCOUNTER — Other Ambulatory Visit: Payer: Self-pay | Admitting: Primary Care

## 2024-03-21 DIAGNOSIS — Z6841 Body Mass Index (BMI) 40.0 and over, adult: Secondary | ICD-10-CM | POA: Diagnosis not present

## 2024-03-21 DIAGNOSIS — I10 Essential (primary) hypertension: Secondary | ICD-10-CM | POA: Diagnosis not present

## 2024-03-21 DIAGNOSIS — J849 Interstitial pulmonary disease, unspecified: Secondary | ICD-10-CM | POA: Diagnosis not present

## 2024-03-21 DIAGNOSIS — M329 Systemic lupus erythematosus, unspecified: Secondary | ICD-10-CM | POA: Diagnosis not present

## 2024-03-21 DIAGNOSIS — R739 Hyperglycemia, unspecified: Secondary | ICD-10-CM | POA: Diagnosis not present

## 2024-03-21 DIAGNOSIS — I429 Cardiomyopathy, unspecified: Secondary | ICD-10-CM | POA: Diagnosis not present

## 2024-03-21 DIAGNOSIS — E785 Hyperlipidemia, unspecified: Secondary | ICD-10-CM | POA: Diagnosis not present

## 2024-03-21 DIAGNOSIS — N182 Chronic kidney disease, stage 2 (mild): Secondary | ICD-10-CM | POA: Diagnosis not present

## 2024-04-04 DIAGNOSIS — R7612 Nonspecific reaction to cell mediated immunity measurement of gamma interferon antigen response without active tuberculosis: Secondary | ICD-10-CM | POA: Diagnosis not present

## 2024-04-05 ENCOUNTER — Encounter: Payer: Self-pay | Admitting: Internal Medicine

## 2024-04-06 ENCOUNTER — Ambulatory Visit

## 2024-04-06 VITALS — BP 149/97 | HR 106 | Temp 98.7°F | Ht 71.0 in | Wt 302.0 lb

## 2024-04-06 DIAGNOSIS — J849 Interstitial pulmonary disease, unspecified: Secondary | ICD-10-CM

## 2024-04-06 DIAGNOSIS — J189 Pneumonia, unspecified organism: Secondary | ICD-10-CM

## 2024-04-06 DIAGNOSIS — Z227 Latent tuberculosis: Secondary | ICD-10-CM | POA: Diagnosis not present

## 2024-04-06 DIAGNOSIS — J45991 Cough variant asthma: Secondary | ICD-10-CM

## 2024-04-06 DIAGNOSIS — M329 Systemic lupus erythematosus, unspecified: Secondary | ICD-10-CM

## 2024-04-06 DIAGNOSIS — M47816 Spondylosis without myelopathy or radiculopathy, lumbar region: Secondary | ICD-10-CM | POA: Diagnosis not present

## 2024-04-06 MED ORDER — CEFPODOXIME PROXETIL 200 MG PO TABS: 200.0000 mg | ORAL_TABLET | Freq: Two times a day (BID) | ORAL | 0 refills | Status: AC

## 2024-04-06 NOTE — Telephone Encounter (Signed)
 Called patient scheduled for acute visit with hattar today

## 2024-04-06 NOTE — Progress Notes (Signed)
 Subjective:   PATIENT ID: Shannon [name] GENDER: female DOB: [birth year], MRN: [id number]   HPI Discussed the use of AI scribe software for clinical note transcription with the patient, who gave verbal consent to proceed.  History of Present Illness Shannon [name] is a 53 year old female with interstitial lung disease and lupus who presents with worsening cough.  She has experienced a worsening cough over the past two weeks, described as feeling like 'somebody just sitting on my chest.' The cough began a couple of days after receiving a flu shot and has not improved with inhalers or over-the-counter medications. No mucus production, fever, chills, or night sweats are present. She experiences slight shortness of breath but denies postnasal drip or recent increase in acid reflux symptoms.  Her medical history includes interstitial lung disease, for which she is currently on mycophenolate . She is not taking prednisone  at this time. She uses Breztri  inhaler every morning and albuterol  as a rescue inhaler. She was previously on a different inhaler, which was replaced by Breztri . She recalls a past episode of a similar cough that resolved with Breztri  and cough syrup.  She has lupus and is followed by a [facility]. She was recently diagnosed with latent tuberculosis and has been on treatment for one month. She is allergic to penicillin, aspirin , and sulfa drugs.  No known exposure to sick contacts and no recent changes in her environment or routine.     Past Medical History:  Diagnosis Date   Anemia    Anxiety    Bilateral swelling of feet    Bronchitis    DDD (degenerative disc disease), lumbosacral    Depression    Dilated cardiomyopathy (HCC)    Dyspnea    Sometimes at rest   G6PD deficiency    GERD (gastroesophageal reflux disease)    Hypertension    ILD (interstitial lung disease) (HCC)    Lactose intolerance    Lupus    Osteoarthritis    Palpitations     Pneumonia    Rheumatoid arthritis (HCC)    SLE (systemic lupus erythematosus) (HCC)    Sleep apnea    TB (tuberculosis)    dormant per pt     Family History  Problem Relation Age of Onset   Sarcoidosis Mother    Lupus Sister    Healthy Daughter    Healthy Son    Healthy Son      Social History   Socioeconomic History   Marital status: Married    Spouse name: Curtistine   Number of children: 3   Years of education: 12   Highest education level: Not on file  Occupational History   Occupation: Haematologist: [employer]    Comment: 12/19/15 applying for SS  Tobacco Use   Smoking status: Former    Current packs/day: 0.00    Average packs/day: 0.5 packs/day for 14.0 years (7.0 ttl pk-yrs)    Types: Cigarettes    Start date: 09/24/2000    Quit date: 09/25/2014    Years since quitting: 9.5   Smokeless tobacco: Never  Vaping Use   Vaping status: Never Used  Substance and Sexual Activity   Alcohol use: Yes    Comment: occ   Drug use: No   Sexual activity: Yes    Birth control/protection: Surgical    Comment: Hysterectomy  Other Topics Concern   Not on file  Social History Narrative   Married, lives with husband, children  Caffeine- coffee  1 daily   Social Drivers of Corporate investment banker Strain: Not on file  Food Insecurity: No Food Insecurity (02/03/2024)   Received from [facility]   Hunger Vital Sign    Within the past 12 months, you worried that your food would run out before you got the money to buy more.: Never true    Within the past 12 months, the food you bought just didn't last and you didn't have money to get more.: Never true  Transportation Needs: No Transportation Needs (02/03/2024)   Received from [facility] - Transportation    In the past 12 months, has lack of transportation kept you from medical appointments or from getting medications?: No    Lack of Transportation (Non-Medical): No   Physical Activity: Not on file  Stress: Not on file  Social Connections: Not on file  Intimate Partner Violence: Not At Risk (12/21/2023)   Humiliation, Afraid, Rape, and Kick questionnaire    Fear of Current or Ex-Partner: No    Emotionally Abused: No    Physically Abused: No    Sexually Abused: No     Allergies  Allergen Reactions   Asa [Aspirin ] Hives and Shortness Of Breath    Chest tightness    Penicillins Hives     Tolerated Cephalosporin Date: 05/01/20.     Sulfa Antibiotics     G6PD deficiency      Outpatient Medications Prior to Visit  Medication Sig Dispense Refill   albuterol  (VENTOLIN  HFA) 108 (90 Base) MCG/ACT inhaler Inhale 2 puffs into the lungs every 6 (six) hours as needed for wheezing or shortness of breath. 1 each 5   amitriptyline  (ELAVIL ) 100 MG tablet Take 100 mg by mouth at bedtime.     BREZTRI  AEROSPHERE 160-9-4.8 MCG/ACT AERO inhaler INHALE 2 PUFFS IN THE MORNING AND AT BEDTIME 11 g 0   buPROPion  (WELLBUTRIN  XL) 300 MG 24 hr tablet Take 300 mg by mouth daily.     busPIRone  (BUSPAR ) 15 MG tablet Take 15 mg by mouth daily.     cetirizine  (ZYRTEC ) 10 MG tablet TAKE 1 TABLET BY MOUTH AT BEDTIME 30 tablet 1   clindamycin  (CLEOCIN ) 300 MG capsule Take 1 capsule (300 mg total) by mouth 3 (three) times daily. 30 capsule 0   cloNIDine  (CATAPRES ) 0.1 MG tablet Take 0.1 mg by mouth at bedtime.     famotidine  (PEPCID ) 20 MG tablet Take 20 mg by mouth 2 (two) times daily.     hydroxychloroquine  (PLAQUENIL ) 200 MG tablet Take 400 mg by mouth at bedtime.     ibuprofen  (ADVIL ) 800 MG tablet Take 1 tablet (800 mg total) by mouth 3 (three) times daily. 21 tablet 0   isoniazid  (NYDRAZID) 300 MG tablet Take by mouth daily.     Magnesium  250 MG CAPS Take 1 capsule by mouth daily.     metoprolol  succinate (TOPROL -XL) 25 MG 24 hr tablet Take 25 mg by mouth daily.     montelukast  (SINGULAIR ) 10 MG tablet TAKE 1 TABLET BY MOUTH AT BEDTIME 30 tablet 11   mycophenolate   (CELLCEPT ) 500 MG tablet Take 1,000 mg by mouth 2 (two) times daily.     MYRBETRIQ  25 MG TB24 tablet Take 25 mg by mouth daily.     omeprazole (PRILOSEC) 20 MG capsule Take 20 mg by mouth daily.     Oxycodone  HCl 10 MG TABS Take 10 mg by mouth 4 (four) times  daily as needed (Pain).     pyridOXINE (VITAMIN B6) 25 MG tablet Take 25 mg by mouth daily.     QUEtiapine  (SEROQUEL  XR) 300 MG 24 hr tablet Take 300 mg by mouth at bedtime.     rifampin (RIFADIN) 150 MG capsule Take 150 mg by mouth daily.     sertraline  (ZOLOFT ) 100 MG tablet Take 200 mg by mouth at bedtime.  0   VEOZAH  45 MG TABS Take 1 tablet by mouth daily.     No facility-administered medications prior to visit.    ROS Reviewed all systems and reported negative except as above     Objective:   Vitals:   04/06/24 1610  BP: (!) 149/97  Pulse: (!) 106  Temp: 98.7 F (37.1 C)  TempSrc: Oral  SpO2: 97%  Weight: (!) 302 lb (137 kg)  Height: 5' 11 (1.803 m)    Physical Exam Constitutional:      Appearance: Normal appearance.  HENT:     Head: Normocephalic.     Nose: Nose normal.     Mouth/Throat:     Mouth: Mucous membranes are moist.  Cardiovascular:     Rate and Rhythm: Normal rate.  Pulmonary:     Effort: Pulmonary effort is normal.  Abdominal:     General: Abdomen is flat.  Skin:    General: Skin is warm.     Capillary Refill: Capillary refill takes less than 2 seconds.  Neurological:     General: No focal deficit present.     Mental Status: She is alert.       CBC    Component Value Date/Time   WBC 3.7 (L) 12/11/2023 1357   RBC 4.11 12/11/2023 1357   HGB 11.8 (L) 12/11/2023 1357   HGB 10.6 (L) 07/30/2023 0937   HCT 37.7 12/11/2023 1357   HCT 32.2 (L) 07/30/2023 0937   PLT 257 12/11/2023 1357   PLT 230 07/30/2023 0937   MCV 91.7 12/11/2023 1357   MCV 89 07/30/2023 0937   MCH 28.7 12/11/2023 1357   MCHC 31.3 12/11/2023 1357   RDW 13.7 12/11/2023 1357   RDW 13.0 07/30/2023 0937    LYMPHSABS 1.7 07/30/2023 0937   MONOABS 0.3 12/15/2022 0305   EOSABS 0.1 07/30/2023 0937   BASOSABS 0.0 07/30/2023 0937      PFT:    Latest Ref Rng & Units 02/25/2024    1:54 PM 12/01/2023   10:52 AM 05/08/2023   10:57 AM 03/05/2021    2:58 PM 07/27/2020    8:55 AM 11/24/2019   11:04 AM 03/08/2018   10:35 AM  PFT Results  FVC-Pre L 2.26  2.04  2.13  2.64  2.59  2.67  2.46   FVC-Predicted Pre % 53  48  50  73  72  73  67   Pre FEV1/FVC % % 90  88  86  85  88  83  84   FEV1-Pre L 2.02  1.79  1.84  2.24  2.28  2.21  2.07   FEV1-Predicted Pre % 60  53  54  77  78  75  70   DLCO uncorrected ml/min/mmHg 16.02  14.43  14.28  15.34  19.54  18.56    DLCO UNC% % 63  57  56  60  76  72    DLCO corrected ml/min/mmHg 16.02  15.93  14.28  15.34  19.54  20.32    DLCO COR %Predicted % 63  63  56  60  76  79    DLVA Predicted % 112  115  94  97  117  122          Assessment & Plan:   Assessment and Plan Assessment & Plan Cough in immunosuppressed patient with interstitial lung disease Worsening cough post-flu shot in immunosuppressed patient on mycophenolate . Unresponsive to inhalers or OTC meds. CT scan needed due to immunosuppression and positive TB test. - Order chest CT to evaluate for infection or other pulmonary issues. - Prescribe Augmentin for potential infection. - Continue Breztri  inhaler as previously prescribed.  Interstitial lung disease associated with systemic lupus erythematosus Managed with mycophenolate , monitored by Dr. Geronimo with regular pulmonary function tests. - Coordinate with Dr. Geronimo for ongoing management.  Latent tuberculosis infection on treatment Undergoing treatment with medications from the health department, planned for three months. - Continue current TB treatment regimen as prescribed by the health department.  Drug allergies to penicillins, aspirin , and sulfa drugs Allergies documented, relevant for prescribing antibiotics and other  medications. - Avoid prescribing penicillins, aspirin , and sulfa drugs.        Zola Herter, MD [facility] Office: [phone]

## 2024-04-06 NOTE — Patient Instructions (Signed)
  VISIT SUMMARY: Today, you were seen for a worsening cough that has persisted for the past two weeks. You have a history of interstitial lung disease and lupus, and you are currently on mycophenolate . Your cough began shortly after receiving a flu shot and has not improved with inhalers or over-the-counter medications. You do not have mucus production, fever, chills, or night sweats, but you do experience slight shortness of breath. You are also being treated for latent tuberculosis and have known allergies to penicillin, aspirin , and sulfa drugs.  YOUR PLAN: -WORSENING COUGH IN IMMUNOSUPPRESSED PATIENT WITH INTERSTITIAL LUNG DISEASE: Your worsening cough may be related to an infection or other pulmonary issues, especially given your immunosuppressed state and positive TB test. We will order a chest CT scan to investigate further and prescribe Augmentin to treat any potential infection. Continue using your Breztri  inhaler as previously prescribed.  -INTERSTITIAL LUNG DISEASE ASSOCIATED WITH SYSTEMIC LUPUS ERYTHEMATOSUS: Interstitial lung disease is a condition where the lung tissue becomes inflamed and scarred, making it difficult to breathe. This is being managed with mycophenolate , and you will continue to be monitored by Dr. Geronimo with regular pulmonary function tests.  -LATENT TUBERCULOSIS INFECTION ON TREATMENT: Latent tuberculosis means you have the TB bacteria in your body, but it is not active. You are currently undergoing treatment with medications from the health department, which is planned to last for three months. Continue your current TB treatment regimen as prescribed.  -DRUG ALLERGIES TO PENICILLINS, ASPIRIN , AND SULFA DRUGS: You have documented allergies to penicillin, aspirin , and sulfa drugs. We will avoid prescribing these medications to you.  INSTRUCTIONS: Please schedule a chest CT scan as soon as possible to evaluate your lungs. Continue taking Augmentin as prescribed and use  your Breztri  inhaler daily. Follow up with Dr. Geronimo for your interstitial lung disease management and continue your TB treatment as directed by the health department. Avoid penicillin, aspirin , and sulfa drugs due to your allergies.

## 2024-04-11 DIAGNOSIS — R053 Chronic cough: Secondary | ICD-10-CM

## 2024-04-11 MED ORDER — TRAMADOL HCL 50 MG PO TABS: 50.0000 mg | ORAL_TABLET | Freq: Two times a day (BID) | ORAL | 0 refills | Status: AC

## 2024-04-12 NOTE — Telephone Encounter (Signed)
 Copied from CRM #[id number]. Topic: Clinical - Prescription Issue >> Apr 12, 2024 10:46 AM Russell PARAS wrote: Reason for CRM:   Shannon [name] is calling concerning pt's prescribed Tramadol that was sent in on 10/20. Pt is already prescribed oxycodone , and pharmacy wants to verify provider is wanting to prescribed this medication or if he is making changes to her treatment, leading to prescribing Tramadol  CB#  [phone]   Atcx1 went to busy signal.will call back

## 2024-04-14 ENCOUNTER — Encounter: Payer: Self-pay | Admitting: Internal Medicine

## 2024-04-15 ENCOUNTER — Ambulatory Visit
Admission: RE | Admit: 2024-04-15 | Discharge: 2024-04-15 | Disposition: A | Discharge status: Home / Self Care | Source: Ambulatory Visit

## 2024-04-15 DIAGNOSIS — J45991 Cough variant asthma: Secondary | ICD-10-CM

## 2024-04-15 DIAGNOSIS — R059 Cough, unspecified: Secondary | ICD-10-CM | POA: Diagnosis not present

## 2024-04-15 DIAGNOSIS — J849 Interstitial pulmonary disease, unspecified: Secondary | ICD-10-CM

## 2024-04-15 DIAGNOSIS — R079 Chest pain, unspecified: Secondary | ICD-10-CM | POA: Diagnosis not present

## 2024-04-18 NOTE — Progress Notes (Unsigned)
 PCP [name],[name], PA-C Referred by [name]  HPI   IOV 08/23/2014 His blood work and a CT scan in a few weeks and come back c Chief Complaint  Patient presents with   Pulmonary Consult    [name] for ILD. Pt c/o SOB with activity and rest, dry cough and chest tightness also with and without activity.    53 year old female referred for evaluation of autoimmune interstitial lung disease. She presents with her husband. In 2010 while living in [place] she reports she was diagnosed with lupus associated with rheumatoid arthritis in her joints. In 2012 she moved to live in [place]  and several months after that started noticing insidious onset of shortness of breath. Local rheumatologist diagnosed her with interstitial lung disease. She was referred to [name] at [facility] in [facility] and was started on CellCept /prednisone  for autoimmune interstitial lung disease he and however, sometime around 2 years ago she ran out of medical insurance and stopped taking these medications. During this time her dyspnea has progressed. It is currently rated as moderate to severe. It is present on exertion and relieved by rest. Even minimal amount of exertion makes her very ddyspneic. Now she has insurance and she did see [name] locally and she has autoimmune panel lab ordered. In addition exam revealed crackles and there for she's been referred here for reevaluation of interstitial lung disease and dyspnea. Dyspnea is associated with some chest tightness but no chest pain. This no associated dizzine   11/24/2014 Follow UP ILD  [name]  She has autoimmune ILD with RA/Lupus  She was seen 6 weeks ago, restarted on Cellcept .  Previously on cellcept  but lost her insurance until recently.  On low dose prednisone  5mg  daily .  Last CT chest 4/4 showed ILD changes similar to 2013. Echo was ok with EFG 55-60%, nml PAP . In  March .   Did not take dapsone  ,due to  GP6D deficiency.  Labs ok last week with nml LFT , no sign change in hbg. /wbc.  She is feeling better. Does feel her breathing is some better.  No flare of cough or wheezing.     OV 01/16/2015  Chief Complaint  Patient presents with   Follow-up    Pt c/o DOE, mild dry cough, and chest tightness when SOB. Pt states the chest tightness has improved).    follow-up interstitial lung disease in the setting of rheumatoid arthritis Follow-up high risk medication use - on CellCept  and prednisone  since mid April 2016  - Presents with her husband. Both give a history. Overall she is doing better in terms of dyspnea after starting CellCept  and prednisone . However the improvement this only moderate. She still left with a residual moderate dyspnea on exertion that is also made worse with bending or heart air and improved with rest and cool air. It is associated with some cough and wheezing. She takes albuterol  inhaler which she feels helps only somewhat but not fully and not quickly enough. She is frustrated by this. In addition she's complaining of some associated right lower back paraspinal spasm for which massage gives her relief. He has never attended pulmonary rehabilitation.  - Lab review shows she has had problems with potassium and has had potassium supplementation. Last lab check was 12/08/2014. She is due for lab test right now   OV 03/28/2015   Chief Complaint  Patient presents with  Follow-up    3 month follow up. Pt states that she is still having some problems with her breathing. Pt c/o of feeling chest tightness, chest pain and cough that is dry. Pt denies wheeze. Pt states that she did trial the Advair and does feel that it helped some.   Follow-up interstitial lung disease secondary to autoimmune process and associated dyspnea that seems out of proportion\  She presents with her husband. She continues on CellCept  and prednisone . In the last  visit approximately 2-3 months ago she had out of proportion dyspnea. I gave her some Advair to try. She says this only helped a little bit. Overall she says that dyspnea still persists. It is worse than in the spring 2016 when she started CellCept  and prednisone . It is stable since July 2016. It is moderate in intensity. Occurs randomly at rest but also with exertion. Occasionally relieved by rest but also happens at rest. Heat and humidity make it worse. Advair helped a little bit only. This no associated chest tightness or wheezing. She did try and enroll  in pulmonary rehabilitation but could not afford the the startup program and is waiting to hear from them for the maintenance program. She is frustrated by all the symptoms.  Pulmonary function test October 15,016 today FVC 2.55 L/64%, total lung capacity 4 L/65% and diffusion 14.5/42%. Overall no change since April 2016   OV 07/04/2015  Chief Complaint  Patient presents with   Follow-up    pt. states breathing is baseline. SOB. dry cough. wheezing. occ. chest pain/tightness. feels her airway is blocked.   Follow-up interstitial lung disease admitted autoimmune processes and associated out of proportion dyspnea  She presents again with her husband. She continues on CellCept  2 g twice daily associated with prednisone . She cannot take Bactrim or dapsone  due to G6PD deficiency. Last seen in the fall of 2016. She was having out of proportion dyspnea. Rated cardia pulmonary stress test and she could not tolerate this test. She then underwent right heart catheterization mid-November 2016 with Dr. Ezra Shuck. Review the tests this is normal. Overall stable but she and husband still continued to complain about this resting dyspnea associated with wheezing. They hear noises in the chest. Sometime she gasps for air even at rest. She feels it comes from the chest but the husband points to the throat. I offered second opinion at [facility] because of  this unusual symptoms but they declined citing distance. She was supposed to attend pulmonary rehabilitation but they cannot afford a $60 co-pay twice a week 8 weeks. Offered ENT evaluation that agreeable but they wanted done in [place] which is closer logistically. In addition patient is contemplating now applying for disability. 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  Walking desaturation test 185 feet 3 laps and rheumatic: No desaturation    OV 10/12/2015  Chief Complaint  Patient presents with   Follow-up    PFT today.  breathing is better.  Pt is currently on STD, [employer] approved STD until today, wants to know today if pt is able to return to work, any restrictions.  Pt states that she has been more stable while out of work.      Follow-up interstitial lung disease due to autoimmune processes not otherwise specified  Last seen January 2017. Since then she has been on short-term disability and out of work. She does heavy manual work. The lack of work estimated dyspnea better. She did pulmonary function test today  that I personally visualized and overall it is same. There are no new issues. She is on CellCept  and she is tolerating this fine. Last visit she had some anemia we follow this up and the anemia was better. Also hypokalemia resolved by February 2017. She is due for blood work today. 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 This showed up at last visit. She saw an ENT in  within referred her to [facility]. She does not want to go to [facility]. She ended up seeing Dr. Ethyl locally. But now she is going to see Dr.  Daniel Shiley [facility]   Pulmonary function test today 10/12/2015 shows postbronchodilator FVC 2.65 L/67%. FEV1 2.34 L/72% which is up 17% positive bronchodilator response. Ratio of 80/106%. Total lung capacity of 3.8/62%. DLCO 18.36/52%.  Overall consistent with restriction and lung parenchymal disease. Overall PFTs are stable compared to April 2016 but perhaps in DLCO slightly better   OV 04/15/2016  Chief Complaint  Patient presents with   Interstitial Lung Disease    Breathing is unchanged since last OV. Reports SOB, coughing. Cough is non productive. Denies chest tightness or wheezing.   Follow-up interstitial lung disease due to autoimmune processes not otherwise specified. On CellCept  and prednisone . Not on Bactrim prophylaxis due to G6PD deficiency  Last visit April 2017. At that time based on pulmonary function tests showed stability and ILD for a year. 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 She did see local ENT doctor [place] and apparently has been reassured. At this point in time she is applying for long-term disability at work but the work discharged from services and she is now applying for Social Security disability. Her dyspnea stable since the interim. She's also had hysterectomy but for the last few weeks her cough is worse and it is dry. This no fever. Is no weight loss or chills. She says she's compliant with her CellCept  and prednisone .  OV 05/29/2016  Chief Complaint  Patient presents with   Follow-up    Pt states she still has harsh dry cough, pt states her SOB is unchanged and chest tightness when SOB or when coughing a lot. Pt deneis f/c/s.     Follow-up interstitial lung disease due to autoimmune processes not otherwise specified. On CellCept  and prednisone  since April 2016. Not on Bactrim prophylaxis due to G6PD deficiency  Obera returns for follow-up. She had high resolution CT chest that shows stability and interstitial lung disease since April 2016. She Pulmicort  function test that shows mild improvement since April 2016. Therefore it appears that her CellCept  and prednisone  is helping her she'll lung disease related to collagen-vascular disease. However she  tells me that she continues to have chronic cough for the last few to several months. It is progressive. It is dry. It is worse than her baseline. The dyspnea is unchanged. This no fever or weight loss or chills   OV 07/24/2016  Chief Complaint  Patient presents with   Follow-up    Pt states she feels the Arnuity has been helping her breathing. Pt c/o dry cough and occ chest tightness.   rec    ICD-9-CM ICD-10-CM   1. ILD (interstitial lung disease) (HCC) 515 J84.9   2. Chronic cough 786.2 R05   3. High risk medication use V58.69 Z79.899    Clinically improved with cellcept /prednisone  and arnuity  Blood work ok dec 2017  pLAN - start pulm rehab at [facility] - continue  aruity daily - continue cellcept  and prednisone   Followup - 6 months do Pre-bd spiro and dlco only. No lung volume or bd response. No post-bd spiro - 6 months fu Dr Geronimo after above or sooner if needed     12/15/2016  f/u ov/Wert re: cough dry on arnuity  Chief Complaint  Patient presents with   Follow-up    Breathing is unchanged since her last visit. Pt states she is here to f/u on recent ABG result. She c/o feeling tired all of the time. She has occ cough- non prod.       Finished at Rehab   beginning  Of May  2018 and trouble walking fast or uphill Waking up with ha's since rehab sev days a week  Cough is new Shannon [name] one month mostly dry and day > noct   No obvious day to day or daytime variability or assoc excess/ purulent sputum or mucus plugs or hemoptysis or cp or chest tightness, subjective wheeze or overt sinus or hb symptoms. No unusual exp hx or h/o childhood pna/ asthma or knowledge of premature birth.   OV 02/13/2017  Chief Complaint  Patient presents with   Follow-up    cough/ILD follow up - prod cough with brownish/green mucus with tightness and chest pain x2 days.  denies any f/c/s, hemoptysis.  PFT done today.    Follow-up interstitial lung disease due to autoimmune processes not  otherwise specified. On CellCept  and prednisone  since April 2016. Not on Bactrim prophylaxis due to G6PD deficiency  53 year old female immunosuppressed with CellCept  and prednisone . Not seen her in many months. Currently taking prednisone  3 mg per day and CellCept  1000 mg twice daily. She cannot do Bactrim prophylaxis because of G6PD deficiency. She tells me that overall her health has been stable but in the last few weeks noticing increased shortness of breath in the last few days this increased cough and change in color of sputum to green and increased chest tightness and a feeling that she is getting acute bronchitis. She also was contemplated going to the emergency room a few days ago but now she is better. There is no obvious fever or chills or hemoptysis or edema paroxysmal nocturnal dyspnea or orthopnea. Pulmonary function test today shows 10% decline in FVC and DLCO compared to baseline.   OV 06/08/2017  Chief Complaint  Patient presents with   Follow-up    Feeling about the same as last visit. Still having chest tightness and wheezing at times, Sounds hoarse.     Follow-up interstitial lung disease due to autoimmune processes not otherwise specified. On CellCept  and prednisone  since April 2016. Not on Bactrim prophylaxis due to G6PD deficiency. Normal Right heart cath Nov 2016.  Last high-resolution CT November 2017  Last visit August 2018.  There is a routine follow-up.  Overall she feels stable.  FVC shows stability since August 2018 but declined since 1 year ago.  DLCO shows continued decline.  Though her lung health is stable.  She says she lost her 1 only biological sister last week due to lupus.  The sister was only 4 and lived in [place].  There are no new issues. Walking desaturation test on 06/08/2017 185 feet Shannon [name] 3 laps on ROOM AIR:  did not desaturate. Rest pulse ox was 100%%, final pulse ox was 100%. HR response 81/min at rest to 109/min at peak exertion. Patient Tyauna  A  Puff  Did not Desaturate < 88% . Shannon [name] did nto  Desaturated </= 3% points. ALEEA HENDRY yes did get tachyardic   OV 10/15/2017   Follow-up interstitial lung disease due to autoimmune processes not otherwise specified. On CellCept  and prednisone  since April 2016. Not on Bactrim prophylaxis due to G6PD deficiency. Normal Right heart cath Nov 2016 and feb 2019.  Last high-resolution CT November 2017 and dec 2018 without progresion   Last visit December 2018.  This is a routine follow-up.  In the interim she had high-resolution CT scan of the chest that did not show any progression in interstitial lung disease between 2017 and 2018.SABRA  Therefore we did an echocardiogram that showed slight elevation in pulmonary artery systolic pressure.  Therefore we sent it to her repeat right heart catheterization and this was normal as documented below.  Overall she feels stable compared to one year ago but has declined compared to 2 years ago.  She is also complaining about a new recurrence of cough that happens mostly at night despite Symbicort  and prednisone  and CellCept .  It wakes her up.  It is moderate in intensity.  There is no associated wheezing or edema orthopnea.  It happens randomly.  There is associated heartburn.  She is on nothing for acid reflux.       Right Heart Pressures 08/20/17 [facility] Procedural Findings: Hemodynamics (mmHg) RA mean 3 RV 30/6 PA 23/8, mean 14 PCWP mean 8  Oxygen  saturations: PA 72% AO 98%  Cardiac Output (Fick) 6.73  Cardiac Index (Fick) 3.06  Cardiac Output (Thermo) 6.94 Cardiac Index (Thermo) 3.15    OV 12/01/2017  Chief Complaint  Patient presents with   Follow-up    Pt has SOB with exertion, wheezing, some chest tightness. Pt was dry cough more at bedtime.      Follow-up interstitial lung disease due to autoimmune processes not otherwise specified. On CellCept  and prednisone  since April 2016. Not on Bactrim prophylaxis due to G6PD deficiency.  Normal Right heart cath Nov 2016 and feb 2019.  Last high-resolution CT November 2017 and dec 2018 without progresion   This visit is to see if she is got progressive interstitial lung disease.  Last visit she was complaining of more cough.  Therefore we added some acid reflux treatment.  She says after the acid reflux treatment symptoms have actually improved.  This contradicts what she told the CMA at intake.  Overall she is feeling stable.  However she does have a new episode of orthostatic dizziness going on at a mild level for the last 1 week.  Today after doing pulmonary function test she did feel dizzy.  Therefore we checked orthostatics on her and she got tachycardic standing up and her blood pressure did drop although still within normal limits.  She does not have any chest pain.  Lung function test shows continued stability in the last 1 year including compared to the most recent attempt.  OV 03/08/2018  Subjective:  Patient ID: Shannon [name], female , DOB: 1970/08/03 , age 75 y.o. , MRN: [id number] , ADDRESS: [address]  [facility] [address]   03/08/2018 -   Chief Complaint  Patient presents with   Follow-up    Spirometry performed today. Pt states she is still having some mild problems with dizziness but not as bad as last visit. Pt also states she has been having problems with headaches x1 month. Pt states her breathing is about the same  as last visit and also states she still has the dry cough.    Follow-up interstitial lung disease due to autoimmune processes not otherwise specified. On CellCept  and prednisone  since April 2016. Not on Bactrim prophylaxis due to G6PD deficiency. Normal Right heart cath Nov 2016 and feb 2019.  Last high-resolution CT November 2017 and dec 2018 without progresion     HPI Shannon [name] 53 y.o. - connective tissue disease ILD. Follow-up. Last seen June 2019 by she started having new onset dizziness. It seemed orthostatic. She says since then it is  spontaneously improved but still persists. She is also dealing with fatigue issues. In 02/26/2018 she felt she had a lupus flare saw Dr. JONETTA and given a prednisone  burst. Autoimmune profile at that time showed continued positive ANA titer but normal complements. She had a CBC that showed a drop in hemoglobin by 1 g percent. Her baseline is around 11.5 g percent and currently it is around 10.5 g percent. She says after the prednisone  burst a lupus flare symptoms and fatigue have improved although it still persists. If it is actually worse in the last few months. She had pulmonary function test that shows continued improvement especially following the recent prednisone  burst. Walking desaturation test was normal other than tachycardia. We observed her to be walking very slowly in a very deconditioned and fatigued fashion. 02/26/2089 and liver function and renal function reviewed and this was normal. Medication review shows she is on oxycodone  and gabapentin  for back pain. She says she's been on gabapentin  for the last 1 year. It appears that the temporal sequence of fatigue and dizziness might be related to gabapentin  but she is not so sure.       Results for NIKO, JAKEL (MRN [id number]) as of 03/08/2018 10:51  Ref. Range 02/26/2018 10:53  Hemoglobin Latest Ref Range: 11.7 - 15.5 g/dL 89.4 (L)  Results for EURETHA, NAJARRO (MRN [id number]) as of 03/08/2018 10:51  Ref. Range 02/26/2018 10:53  Creatinine Latest Ref Range: 0.50 - 1.10 mg/dL 9.26  Results for ZETA, BUCY (MRN [id number]) as of 03/08/2018 10:51  Ref. Range 02/26/2018 10:53  AST Latest Ref Range: 10 - 35 U/L 19  ALT Latest Ref Range: 6 - 29 U/L 18   Results for LUCINDIA, LEMLEY (MRN [id number]) as of 03/08/2018 10:51  Ref. Range 02/26/2018 10:53  Anti Nuclear Antibody(ANA) Latest Ref Range: NEGATIVE  POSITIVE (A)  ANA Pattern 1 Unknown SPECKLED (A)  ANA Titer 1 Latest Units: titer > OR = 1:1280 (A)  ds DNA Ab Latest Units: IU/mL <1  C3  Complement Latest Ref Range: 83 - 193 mg/dL 864  C4 Complement Latest Ref Range: 15 - 57 mg/dL 33    ROS - per HPI    OV 06/03/2018  Subjective:  Patient ID: Shannon [name], female , DOB: 1971/02/24 , age 38 y.o. , MRN: [id number] , ADDRESS: [address]  [facility] [address]   06/03/2018 -   Chief Complaint  Patient presents with   Follow-up    pt states breathing is baseline. c/o sob with exertion, non prod cough & wheezing    Follow-up interstitial lung disease due to autoimmune processes not otherwise specified. On CellCept  and prednisone  since April 2016. Not on Bactrim prophylaxis due to G6PD deficiency. Normal Right heart cath Nov 2016 and feb 2019.  Last high-resolution CT November 2017 and dec 2018 without progresion   HPI Shannon [name] 53 y.o. -presents for follow-up.  Last visit September 2019.  At that time CBC showed anemia.  We repeated the hemoglobin 1 month later and was stable around 10 g%.  She tells me that overall she is stable.  Last visit she had dizziness and I told her to stop gabapentin  which she did and her dizziness and fatigue have improved.  She feels she is stable but in the last 3 days she has had a dry cough but no fever or congestion or wheezing or hemoptysis or edema.  No worsening shortness of breath or chest tightness.  Other than that she feels good.  She did up walking desaturation test today and it appears similar to previous visit.       OV 05/13/2019  Subjective:  Patient ID: Shannon [name], female , DOB: [birth year] , age 28 y.o. , MRN: [id number] , ADDRESS: [address] [place] [address]   05/13/2019 -   Chief Complaint  Patient presents with   Follow-up    Pt just had total left knee replacement mid October 2020. Pt said she still has problems with SOB especially with going upstairs. Pt also has occ wheezing but denies any real complaints of cough.   Follow-up interstitial lung disease due to autoimmune processes not  otherwise specified. On CellCept  and prednisone  since April 2016. Not on Bactrim prophylaxis due to G6PD deficiency. Normal Right heart cath Nov 2016 and feb 2019.  Last high-resolution CT November 2017 and dec 2018 without progression. Last PFT Sept 2019  HPI Shannon [name] 54 y.o. -returns for follow-up.  Last seen in September 2019 and because of the pandemic things got delayed.  She was supposed to see me in 6 months with spirometry and high-resolution CT chest for progression but this did not happen.  At this follow-up we do not have those data points as yet.  She tells me the interim she has had knee surgery and therefore she is not able to do a simple walk test with us .  She has gained some weight.  Also in the interim she is now on Social Security disability and she is unemployed now.  She feels her shortness of breath is slightly worse particularly when walking up stairs but otherwise overall she feels stable.  I reviewed her blood work.  And it seems anemia is worse in the 9 g%.  She really denies any bleeding.  Normally she runs between 10 and 11 g%.  I noticed that she still not had a shingles vaccine.   OV 01/09/2020   Subjective:  Patient ID: Shannon [name], female , DOB: 16-Jan-1971, age 56 y.o. years. , MRN: [id number],  ADDRESS: [address] [place] [address] PCP  Venancio Pock, PA-C Providers : Treatment Team:  Attending Provider: Geronimo Amel, MD    Follow-up interstitial lung disease due to autoimmune processes not otherwise specified. On CellCept  and prednisone  since April 2016. Not on Bactrim prophylaxis due to G6PD deficiency. Normal Right heart cath Nov 2016 and feb 2019.  Last high-resolution CT November 2017 and dec 2018 and dec 2020 without progression. Last PFT Sept 2019 and now June 2021  Chief Complaint  Patient presents with   Follow-up       HPI Shannon [name] 53 y.o. -returns for follow-up.  Since her last visit she continues to be stable.   She is only on CellCept .  She is also on Plaquenil .  She is not on prednisone  or antifibrotic's.  Her dyspnea is  stable.  Her pulmonary function test is improved and a CT chest shows stability.  The main issue is that she says over the last 3 years she has gained 30 to 40 pounds of weight.  She says that she is snoring a lot according to her husband.  Apparently the husband's also noticed apneic spells.  She says she has had the Covid vaccine but is not documented in our chart.  She is yet to have the Shingrix vaccine.       Results for CHRISHAWN, KRING (MRN [id number]) as of 02/13/2017 12:16  Ref. Range 10/09/2014 09:39 03/28/2015 11:40 10/12/2015 13:42 05/06/2016 10:38 02/13/2017 10:49 06/08/2017  12/01/2017  03/08/2018  11/24/19  FVC-Pre Latest Units: L 2.54 2.55 2.53 2.63 2.32 2.31 2.38 2.46 2.67  FVC-%Pred-Pre Latest Units: % 64 64 64 67 59 59% 61% 67% 73%   Results for KEISHANA, KLINGER (MRN [id number]) as of 02/13/2017 12:16  Ref. Range 10/09/2014 09:39 03/28/2015 11:40 10/12/2015 13:42 05/06/2016 10:38 02/13/2017 10:49 06/08/2017  12/01/2017  03/08/2018  11/24/19  DLCO unc Latest Units: ml/min/mmHg 15.03 14.50 18.36 18.31 16.35 14.21 16.38 Shannon [name] 18.56  DLCO unc % pred Latest Units: % 43 42 53 53 47 41 47% Shannon [name] 72%    ROS - per HPI Results for SHEVON, SIAN (MRN [id number]) as of 01/09/2020 16:33  Ref. Range 12/21/2019 22:12  Creatinine Latest Ref Range: 0.44 - 1.00 mg/dL 9.17  Results for JALILA, GOODNOUGH (MRN [id number]) as of 01/09/2020 16:33  Ref. Range 12/21/2019 22:12  Hemoglobin Latest Ref Range: 12.0 - 15.0 g/dL 89.3 (L)   IMPRESSION: dec 2020 compared to 2018 1. The appearance of the lungs remains compatible with interstitial lung disease, with a spectrum of findings considered indeterminate for usual interstitial pneumonia (UIP) per current ats guidelines. However, given the spectrum of findings and the stability of the findings compared to the prior study, this is once again strongly favored to  represent nonspecific interstitial pneumonia (NSIP).     Electronically Signed   By: Toribio Aye M.D.   On: 06/15/2019 09:42 ROS - per HPI    OV 07/27/2020  Subjective:  Patient ID: Shannon [name], female , DOB: 1971/05/08 , age 73 y.o. , MRN: [id number] , ADDRESS: [address] [place] [address] PCP Venancio Pock, PA-C Patient Care Team: [name], [name], PA-C as PCP - General (Internal Medicine)  This Provider for this visit: Treatment Team:  Attending Provider: Geronimo Amel, MD    07/27/2020 -   Chief Complaint  Patient presents with   Follow-up    Get pft results.   Follow-up interstitial lung disease due to autoimmune processes not otherwise specified. -Lupus per [facility] clinic notes in December 2021.   - On CellCept  and prednisone  since April 2016.  Off prednisone  in 2021  Not on Bactrim prophylaxis due to G6PD deficiency.    - Normal Right heart cath Nov 2016 and feb 2019  -  Last high-resolution CT November 2017 and dec 2018 and dec 2020 without progression.    - Last PFT Sept 2019 and now June 2021 and feb 2022  Chief Complaint  Patient presents with   Follow-up      HPI Shannon [name] 53 y.o. -returns for routine ILD follow-up.  Last seen in July 2021.  Clinically she is stable.  Since last seeing me November 2020 when she had right knee surgery.  She uses a cane.  She is also seen Dr. Ernie in sleep  clinic.  She is now on CPAP and it is helping her.  Her weight gain persists.  Her mobility has therefore gone down but from a dyspnea standpoint she is stable.  She is up-to-date with her COVID vaccine.  With the booster she did get symptomatic with Sirs response.  She continues on CellCept .  She is now seeing [facility]ology.  She does not see [facility] anymore.  She believes she has lupus.  She has an appointment upcoming in a few days.  Review of the records at [facility] indicate t that she has lupus.  She continues to  be off prednisone .  She is unable to do a walk test today because of her knee issues and also she is on a cane.  She has pulmonary function test there is a 2-3.8% decline in FVC/DLCO.  This could easily be because of weight gain.  Her symptom scores are stable.  She did have CT angiogram in the summer 2021 this ruled out pulmonary embolism.  There is no comment about ILD.  But then it is a contrast CT.    OV 03/05/2021  Subjective:  Patient ID: Shannon [name], female , DOB: 09-11-1970 , age 25 y.o. , MRN: [id number] , ADDRESS: [address] [place] [address] PCP Venancio Pock, PA-C Patient Care Team: [name], [name], PA-C as PCP - General (Internal Medicine)  This Provider for this visit: Treatment Team:  Attending Provider: Geronimo Amel, MD    03/05/2021 -   Chief Complaint  Patient presents with   Follow-up    PFT performed today.  Pt states she has been doing okay since last visit and states that her breathing is about the same.   Follow-up interstitial lung disease due to autoimmune processes not otherwise specified. -Lupus per [facility] clinic notes in December 2021.   - On CellCept  and prednisone  since April 2016.  Off prednisone  in 2021  Not on Bactrim prophylaxis due to G6PD deficiency.    - Normal Right heart cath Nov 2016 and feb 2019  -  Last high-resolution CT November 2017 and dec 2018 and dec 2020 without progression. - > July 2021 CTA   - Last PFT Sept 2019 and now June 2021 and feb 2022 and sept 2022  HPI MAICEY BARRIENTEZ 53 y.o. -returns for follow-up.  She continues on her immunosuppressive regimen through [facility] rheumatology program.  She is happy with the care there.  She is not on prednisone .  She uses CPAP through Dr. MALVA.  Overall she is feeling stable.  Symptom scores are stable.  She had pulmonary function test and it shows that her FVC is stable but DLCO is declined.  This correlates with a hemoglobin around 10 last done in June 2022 at  [facility].  But then she is also chronically anemic.  She is definitely not noticing any worsening in her shortness of breath.  Her walking desaturation test is also stable.  She wants her flu shot today.        PFT  OV 03/18/2022  Subjective:  Patient ID: Shannon [name], female , DOB: July 25, 1970 , age 62 y.o. , MRN: [id number] , ADDRESS: [address] [place] [address] PCP Venancio Pock, PA-C Patient Care Team: [name], [name], PA-C as PCP - General (Internal Medicine)  This Provider for this visit: Treatment Team:  Attending Provider: Geronimo Amel, MD    03/18/2022 -   Chief Complaint  Patient presents with  Follow-up    Pt states she is better compared to last visit and states the cough is better.   HPI Shannon [name] 53 y.o. -returns for follow-up.  I last saw her 1 year ago.  She continues on CellCept  and Plaquenil  through [facility].  Shortness of breath is stable.  She had high-resolution CT chest in March 2023 and that is without progression.  She has no other new issues.  She did in the interim tear her rotator cuff on the left side and had surgery.  She is having some sciatic problems.  She will have the flu shot today.  She follows with Dr. MALVA for sleep apnea.    CT Chest data HRCT March 2023  IMPRESSION: Basilar and subpleural predominant ground-glass and septal thickening with air trapping, similar to 12/22/2019. Assessment is somewhat limited by respiratory motion and body habitus. Nonspecific interstitial pneumonitis is favored. Findings are indeterminate for UIP per consensus guidelines: Diagnosis of Idiopathic Pulmonary Fibrosis: An Official ATS/ERS/JRS/ALAT Clinical Practice Guideline. Am JINNY Honey Crit Care Med Vol 198, Iss 5, [phone], Feb 21 2017.     Electronically Signed   By: Newell Eke M.D.   On: 08/28/2021 11:12    No results found.     OV 10/29/2022  Subjective:  Patient ID: Shannon [name], female , DOB:  07-06-1970 , age 61 y.o. , MRN: [id number] , ADDRESS: [address] [place] [address] PCP Venancio Pock, PA-C Patient Care Team: [name], [name], PA-C as PCP - General (Internal Medicine)  This Provider for this visit: Treatment Team:  Attending Provider: Geronimo Amel, MD 10/29/2022 -   Chief Complaint  Patient presents with   Follow-up    No complaints.  PFT tomorrow.     HPI Shannon [name] 53 y.o. -returns for follow-up of her ILD due to lupus.  She continues to be stable.  She is supposed a pulmonary function test but she got the visit scheduled today and the pulmonary function schedule tomorrow 10/30/2022.  Nevertheless she feels stable.  She continues on CellCept  through [facility] rheumatology.  She has no new issues.  Her main complaint is that she has low backache because of degenerative disc disease in the L4-L5 and L5-S1 areas.  She send procedure is being considered.  I have asked her to consider professional stretching through several, she will stretch facilities that are available.  The commercial facilities available in the local area stretch zone, stretch med and stretch lab.  Asked her to get clearance from her doctor.  She is willing to try this.  I did indicate to her that it could be a out-of-pocket cost.  There is clinical research evidence showing stretching to reduce pain and improve mobility.      OV 08/17/2023  Subjective:  Patient ID: Shannon [name], female , DOB: 28-Sep-1970 , age 83 y.o. , MRN: [id number] , ADDRESS: [address] [place] [address] PCP Venancio Pock, PA-C Patient Care Team: [name], [name], PA-C as PCP - General (Internal Medicine)  This Provider for this visit: Treatment Team:  Attending Provider: Geronimo Amel, MD  08/17/2023 -   Chief Complaint  Patient presents with   Follow-up     HPI Shannon [name] 53 y.o. -is a routine follow-up.  Last seen in May 2024.  She was then November 2020 for visit.  She says she  continues to be stable.  The symptom score suggests continued stability.  The exercise hypoxemia test  shows continued stability.  In November 2024 she had pulmonary function testing that shows a decline compared to 2022 but also this.  She is gaining a lot of weight.  In the last 1 year she has lost 17 pounds of weight but overall she is morbidly obese.  Past medical issues include spinal stimulator since October 2024 for her back and also being on metformin  for insulin  resistance  No change in ongoing immunosuppressive medication.  CT Chest data from date: march 2023  IMPRESSION: Basilar and subpleural predominant ground-glass and septal thickening with air trapping, similar to 12/22/2019. Assessment is somewhat limited by respiratory motion and body habitus. Nonspecific interstitial pneumonitis is favored. Findings are indeterminate for UIP per consensus guidelines: Diagnosis of Idiopathic Pulmonary Fibrosis: An Official ATS/ERS/JRS/ALAT Clinical Practice Guideline. Am JINNY Honey Crit Care Med Vol 198, Iss 5, [phone], Feb 21 2017.     Electronically Signed   By: Newell Eke M.D.   On: 08/28/2021 11:12   OV 12/01/2023  Subjective:  Patient ID: Shannon [name], female , DOB: 04-16-1971 , age 10 y.o. , MRN: [id number] , ADDRESS: [address] [place] [address] PCP Venancio Pock, PA-C Patient Care Team: Venancio Pock RIGGERS as PCP - General (Internal Medicine)  This Provider for this visit: Treatment Team:  Attending Provider: Geronimo Amel, MD    12/01/2023 -   Chief Complaint  Patient presents with   Follow-up    Follow up , ILD , needs clearance for surgery  for back sugery hasn't  set up as of yet       HPI XITLALLY MOONEYHAM 53 y.o. -returns for follow-up.  This visit is to assess for progression of ILD.  Her symptom score seems slowly worse compared to 2 years ago.  She feels because of weight gain.  She is also reporting that the neurostimulator in her  back is repositioned and she requires another surgery and this could also be contributing to increased shortness of breath via virtue of worsening back pain.  Exercise hypoxemia test is stable.  However pulmonary function test shows continued decline.  She did have a high-resolution CT chest in the spring 2025 but this was only compared to earlier in the year and it is noted as stable.  But at least in 2022 pulmonary function test seems to show continued decline both FVC and DLCO.  At this point in time her priority is to get her back surgery.  She continues on CellCept .  OV 02/25/2024  Subjective:  Patient ID: Shannon [name], female , DOB: 07-Dec-1970 , age 67 y.o. , MRN: [id number] , ADDRESS: [address] [place] [address] PCP Venancio Pock, PA-C Patient Care Team: Venancio Pock RIGGERS as PCP - General (Internal Medicine)  This Provider for this visit: Treatment Team:  Attending Provider: Geronimo Amel, MD    02/25/2024 -   Chief Complaint  Patient presents with   Medical Management of Chronic Issues   Interstitial Lung Disease    PFT repeated today. Breathing has gradually worsened since the last visit. She has occ dry cough.      HPI Shannon [name] 53 y.o. -returns for follow-up.  This concern for progressive phenotype because pulmonary function test has progressively declined between 2022 and also most recently June 2025.  After that she has spinal cord stimulator surgery.  Shortness of breath is stable.  Exercise hypoxemia test is stable.  Pulmonary function test actually shows an improvement and  it is better than 1 year ago although it still worse compared to 2022.  She feels the same.  [facility]  She saw Dr. Austin.  Benlysta has been considered.  Is dated a QuantiFERON gold and it is positive.  Therefore she is being referred to the Department of Public Health.  She is  waiting to hear from them she has had a chest Shannon [name]-ray with them.  She is surprised about this result.  Currently there are no B symptoms but she is immunosuppressed.  I did educate her about latent TB and the risk for active TB in the setting of immunosuppression.      OV 04/06/24  CHANCIE LAMPERT is a 53 year old female with interstitial lung disease and lupus who presents with worsening cough.  She has experienced a worsening cough over the past two weeks, described as feeling like 'somebody just sitting on my chest.' The cough began a couple of days after receiving a flu shot and has not improved with inhalers or over-the-counter medications. No mucus production, fever, chills, or night sweats are present. She experiences slight shortness of breath but denies postnasal drip or recent increase in acid reflux symptoms.  Her medical history includes interstitial lung disease, for which she is currently on mycophenolate . She is not taking prednisone  at this time. She uses Breztri  inhaler every morning and albuterol  as a rescue inhaler. She was previously on a different inhaler, which was replaced by Breztri . She recalls a past episode of a similar cough that resolved with Breztri  and cough syrup.  She has lupus and is followed by a rheumatologist at [facility]. She was recently diagnosed with latent tuberculosis and has been on treatment for one month. She is allergic to penicillin, aspirin , and sulfa drugs.  No known exposure to sick contacts and no recent changes in her environment or routine.      OV 04/19/2024  Subjective:  Patient ID: Shannon [name], female , DOB: [birth year] , age 37 y.o. , MRN: [id number] , ADDRESS: [address] [place] [address] PCP Venancio Pock, PA-C Patient Care Team: Venancio Pock RIGGERS as PCP - General (Internal Medicine)  This Provider for this visit: Treatment Team:  Attending Provider: Geronimo Amel, MD     Follow-up interstitial lung  disease due to autoimmune processes not otherwise specified. -Lupus per [facility] clinic notes in December 2021.   - On CellCept   - s/p Prednisone  since April 2016 - Off prednisone  in 2021   - Not on Bactrim prophylaxis due to G6PD deficiency.    - Normal Right heart cath Nov 2016 and feb 2019  -  High-resolution CT -  November 2017 and dec 2018 and dec 2020 without progression. - > July 2021 CTA -. -  HRCT March 2023 - no change sinc 2021  Of chronic prednisone  since 2021.   - Last PFT June 2025  04/19/2024 -   Chief Complaint  Patient presents with   Acute Visit    Pt states she has  Dry cough for over 3 weeks  that's bothering her, also states CT scan results SOB w/ exertion and w/o since pt has had cough      HPI Shannon [name] 53 y.o. -  Shannon [name] is a 53 year old female who presents with a persistent dry cough for three weeks.  This is an acute  visit.  I just saw her 6 weeks ago.  Then approximately 1-2 weeks ago she saw Dr. Donzetta in our office.  She is here because she is no better.  She did have a CT scan of the chest April 15, 2024 but I personally tried to see the images and the images are not uploaded.  The results are not back either.  She has experienced a persistent dry cough for three weeks, which began as a mild cough and has progressively worsened. The cough is described as 'real rough bronchial' and intensifies when lying down, causing difficulty sleeping. It is also exacerbated by talking and drinking cold beverages, while warm tea with honey and lemon provides temporary relief.  She has attempted to manage the cough with Albuterol  and Brevstreet inhalers, as well as over-the-counter cough syrup, but these have not provided relief. She has not used prednisone . The cough is severe enough to cause dizziness and a sensation of near syncope, particularly when coughing in the car. No wheezing or fever is present, but there is slight shortness of breath with intense  coughing.  She has a history of seasonal allergies but has not undergone skin testing.  She is willing to get a RAST allergy panel tested  She wants nebulizer treatment in the office.   SYMPTOM SCALE - ILD 05/13/2019  07/27/2020  03/05/2021  03/18/2022  10/29/2022  08/17/2023  Cellcept  + Not on pred Singular + 12/01/2023 Cellcept  + Not on pred Singular + 02/25/2024   O2 use RA ra ra ra ra ra    Shortness of Breath 0 -> 5 scale with 5 being worst (score 6 If unable to do)         At rest 2 1 1 2 3 1 1 3   Simple tasks - showers, clothes change, eating, shaving 3 2 2 3 2 2 2 3   Household (dishes, doing bed, laundry) 3 3 4 3 3 2 3 2   Shopping 3 4 3  Did not ansewr No aswer 3 3 3   Walking level at own pace 2 3 3 4 2 3 3 3   Walking up Stairs 5 5 3 4 1 3 4 4   Total (40 - 48) Dyspnea Score 29 18 16 16 11 14 16 18   How bad is your cough? 0 2 0 0 2 2 1 1   How bad is your fatigue 3 0 3 2 0 4 3 4   nausea  0 0 0 0 0 Shannon [name] 0  vomit  0 0 0 0 0 Shannon [name] 0  diarrhea  0 0 0 4 0 0 0  anxiety  4 4 4 3 3  0 3  depression  4  3 z 3 4 4             SIT STAND TEST - goal 15 times   12/01/2023  02/25/2024   O2 used ra ra  PRobe - finter or forehead fing finter  Number sit and stand completed - goal 15 15 15   Time taken to complete 2 min 41 sec  Resting Pulse Ox/HR/Dyspnea  99% and 99/min and dyspnea of 0/10  97% and R 103  Peak measures 98 % and 115/min and dyspnea of 4/10 98% and HR 114  Final Pulse Ox/HR 99% and 103/min and dyspnea of 2/10 99% and HR 110  Desaturated </= 88% no no  Desaturated <= 3% points no no  Got Tachycardic >/= 90/min yes yes  Miscellaneous comments Shannon [name] Worst dyspnea score 5  PFT     Latest Ref Rng & Units 02/25/2024    1:54 PM 12/01/2023   10:52 AM 05/08/2023   10:57 AM 03/05/2021    2:58 PM 07/27/2020    8:55 AM 11/24/2019   11:04 AM 03/08/2018   10:35 AM  PFT Results  FVC-Pre L 2.26  2.04  2.13  2.64  2.59  2.67  2.46   FVC-Predicted Pre % 53  48  50  73  72  73  67    Pre FEV1/FVC % % 90  88  86  85  88  83  84   FEV1-Pre L 2.02  1.79  1.84  2.24  2.28  2.21  2.07   FEV1-Predicted Pre % 60  53  54  77  78  75  70   DLCO uncorrected ml/min/mmHg 16.02  14.43  14.28  15.34  19.54  18.56    DLCO UNC% % 63  57  56  60  76  72    DLCO corrected ml/min/mmHg 16.02  15.93  14.28  15.34  19.54  20.32    DLCO COR %Predicted % 63  63  56  60  76  79    DLVA Predicted % 112  115  94  97  117  122         LAB RESULTS last 96 hours No results found.       has a past medical history of Anemia, Anxiety, Bilateral swelling of feet, Bronchitis, DDD (degenerative disc disease), lumbosacral, Depression, Dilated cardiomyopathy (HCC), Dyspnea, G6PD deficiency, GERD (gastroesophageal reflux disease), Hypertension, ILD (interstitial lung disease) (HCC), Lactose intolerance, Lupus, Osteoarthritis, Palpitations, Pneumonia, Rheumatoid arthritis (HCC), SLE (systemic lupus erythematosus) (HCC), Sleep apnea, and TB (tuberculosis).   reports that she quit smoking about 9 years ago. Her smoking use included cigarettes. She started smoking about 23 years ago. She has a 7 pack-year smoking history. She has never used smokeless tobacco.  Past Surgical History:  Procedure Laterality Date   ABDOMINAL HYSTERECTOMY     APPENDECTOMY  1980   cardiac catherization  08/20/2017   CARDIAC CATHETERIZATION N/A 05/10/2015   Procedure: Right Heart Cath;  Surgeon: Ezra GORMAN Shuck, MD;  Location: [facility] INVASIVE CV LAB;  Service: Cardiovascular;  Laterality: N/A;   CESAREAN SECTION  '95, '02, '07   Shannon [name] 3   CHOLECYSTECTOMY  2010   LAPAROSCOPIC HYSTERECTOMY  10/2015   have ovaries   RIGHT HEART CATH N/A 08/20/2017   Procedure: RIGHT HEART CATH;  Surgeon: Shuck Ezra GORMAN, MD;  Location: [facility] INVASIVE CV LAB;  Service: Cardiovascular;  Laterality: N/A;   SHOULDER ARTHROSCOPY WITH ROTATOR CUFF REPAIR AND SUBACROMIAL DECOMPRESSION Left 08/13/2022   Procedure: SHOULDER ARTHROSCOPY WITH SUBACROMIAL  DECOMPRESSION, DEBRIDEMENT AND PARTIAL ROTATOR CUFF REPAIR;  Surgeon: Duwayne Purchase, MD;  Location: [facility] ORS;  Service: Orthopedics;  Laterality: Left;   SHOULDER ARTHROSCOPY WITH SUBACROMIAL DECOMPRESSION Right 08/28/2021   Procedure: SHOULDER ARTHROSCOPY WITH SUBACROMIAL DECOMPRESSION AND DEBRIDEMENT;  Surgeon: Duwayne Purchase, MD;  Location: [facility] OR[facility];  Service: Orthopedics;  Laterality: Right;   SPINAL CORD STIMULATOR BATTERY EXCHANGE N/A 12/21/2023   Procedure: SPINAL CORD STIMULATOR BATTERY EXCHANGE;  Surgeon: Burnetta Aures, MD;  Location: [facility] OR;  Service: Orthopedics;  Laterality: N/A;  REPOSITION SPINAL CORD STIMULATOR BATTERY   SPINAL CORD STIMULATOR INSERTION N/A 03/30/2023   Procedure: SPINAL CORD STIMULATOR INSERTION;  Surgeon: Burnetta Aures, MD;  Location: [facility] OR;  Service: Orthopedics;  Laterality: N/A;  3 C-Bed  TOTAL KNEE ARTHROPLASTY Left 04/04/2019   Procedure: TOTAL KNEE ARTHROPLASTY;  Surgeon: Melodi Lerner, MD;  Location: [facility];  Service: Orthopedics;  Laterality: Left;    TOTAL KNEE ARTHROPLASTY Right 04/30/2020   Procedure: TOTAL KNEE ARTHROPLASTY;  Surgeon: Melodi Lerner, MD;  Location: [facility];  Service: Orthopedics;  Laterality: Right;    TUBAL LIGATION      Allergies  Allergen Reactions   Asa [Aspirin ] Hives and Shortness Of Breath    Chest tightness    Penicillins Hives     Tolerated Cephalosporin Date: 05/01/20.     Sulfa Antibiotics     G6PD deficiency     Immunization History  Administered Date(s) Administered   Influenza Split 03/23/2014   Influenza,inj,Quad PF,6+ Mos 03/15/2016, 06/08/2017, 03/08/2018, 02/17/2019, 03/05/2021, 03/18/2022   Influenza-Unspecified 04/03/2020, 03/18/2024   Moderna Sars-Covid-2 Vaccination 09/20/2019, 10/06/2019   PFIZER Comirnaty(Gray Top)Covid-19 Tri-Sucrose Vaccine 11/05/2019, 05/30/2020   PFIZER(Purple Top)SARS-COV-2 Vaccination 10/06/2019, 11/05/2019   Pfizer Covid-19 Vaccine Bivalent Booster 31yrs & up  04/03/2021   Pneumococcal-Unspecified 08/23/2014    Family History  Problem Relation Age of Onset   Sarcoidosis Mother    Lupus Sister    Healthy Daughter    Healthy Son    Healthy Son      Current Outpatient Medications:    albuterol  (VENTOLIN  HFA) 108 (90 Base) MCG/ACT inhaler, Inhale 2 puffs into the lungs every 6 (six) hours as needed for wheezing or shortness of breath., Disp: 1 each, Rfl: 5   amitriptyline  (ELAVIL ) 100 MG tablet, Take 100 mg by mouth at bedtime., Disp: , Rfl:    budesonide -glycopyrrolate-formoterol  (BREZTRI  AEROSPHERE) 160-9-4.8 MCG/ACT AERO inhaler, INHALE 2 PUFFS IN THE MORNING AND AT BEDTIME, Disp: 11 g, Rfl: 11   buPROPion  (WELLBUTRIN  XL) 300 MG 24 hr tablet, Take 300 mg by mouth daily., Disp: , Rfl:    busPIRone  (BUSPAR ) 15 MG tablet, Take 15 mg by mouth daily., Disp: , Rfl:    cetirizine  (ZYRTEC ) 10 MG tablet, TAKE 1 TABLET BY MOUTH AT BEDTIME, Disp: 30 tablet, Rfl: 1   clindamycin  (CLEOCIN ) 300 MG capsule, Take 1 capsule (300 mg total) by mouth 3 (three) times daily., Disp: 30 capsule, Rfl: 0   cloNIDine  (CATAPRES ) 0.1 MG tablet, Take 0.1 mg by mouth at bedtime., Disp: , Rfl:    famotidine  (PEPCID ) 20 MG tablet, Take 20 mg by mouth 2 (two) times daily., Disp: , Rfl:    hydroxychloroquine  (PLAQUENIL ) 200 MG tablet, Take 400 mg by mouth at bedtime., Disp: , Rfl:    ibuprofen  (ADVIL ) 800 MG tablet, Take 1 tablet (800 mg total) by mouth 3 (three) times daily., Disp: 21 tablet, Rfl: 0   isoniazid  (NYDRAZID) 300 MG tablet, Take by mouth daily., Disp: , Rfl:    Magnesium  250 MG CAPS, Take 1 capsule by mouth daily., Disp: , Rfl:    metoprolol  succinate (TOPROL -XL) 25 MG 24 hr tablet, Take 25 mg by mouth daily., Disp: , Rfl:    montelukast  (SINGULAIR ) 10 MG tablet, TAKE 1 TABLET BY MOUTH AT BEDTIME, Disp: 30 tablet, Rfl: 11   mycophenolate  (CELLCEPT ) 500 MG tablet, Take 1,000 mg by mouth 2 (two) times daily., Disp: , Rfl:    MYRBETRIQ  25 MG TB24 tablet, Take 25 mg  by mouth daily., Disp: , Rfl:    omeprazole (PRILOSEC) 20 MG capsule, Take 20 mg by mouth daily., Disp: , Rfl:    Oxycodone  HCl 10 MG TABS, Take 10 mg by mouth 4 (four) times daily as  needed (Pain)., Disp: , Rfl:    predniSONE  (DELTASONE ) 10 MG tablet, Take prednisone  40 mg daily Shannon [name] 2 days, then 30mg  daily Shannon [name] 2 days 20mg  daily Shannon [name] 2 days, then 10mg  daily Shannon [name] 2 days, then 5mg  daily Shannon [name] 2 days and then  STOP, Disp: 22 tablet, Rfl: 0   pyridOXINE (VITAMIN B6) 25 MG tablet, Take 25 mg by mouth daily., Disp: , Rfl:    QUEtiapine  (SEROQUEL  XR) 300 MG 24 hr tablet, Take 300 mg by mouth at bedtime., Disp: , Rfl:    rifampin (RIFADIN) 150 MG capsule, Take 150 mg by mouth daily., Disp: , Rfl:    sertraline  (ZOLOFT ) 100 MG tablet, Take 200 mg by mouth at bedtime., Disp: , Rfl: 0   VEOZAH  45 MG TABS, Take 1 tablet by mouth daily., Disp: , Rfl:       Objective:   Vitals:   04/19/24 1314  BP: 124/76  Pulse: (!) 109  Temp: 98.1 F (36.7 C)  TempSrc: Oral  SpO2: 100%  Weight: 299 lb 6.4 oz (135.8 kg)  Height: 5' 11 (1.803 m)    Estimated body mass index is 41.76 kg/m as calculated from the following:   Height as of this encounter: 5' 11 (1.803 m).   Weight as of this encounter: 299 lb 6.4 oz (135.8 kg).  @WEIGHTCHANGE @  [employer]   04/19/24 1314  Weight: 299 lb 6.4 oz (135.8 kg)     Physical Exam   General: No distress. Looks same O2 at rest: no Cane present: no Sitting in wheel chair: no Frail: no Obese: YES Neuro: Alert and Oriented Shannon [name] 3. GCS 15. Speech normal Psych: Pleasant Resp:  Barrel Chest - no.  Wheeze - no, Crackles - YES BASE, No overt respiratory distress CVS: Normal heart sounds. Murmurs - no Ext: Stigmata of Connective Tissue Disease - no HEENT: Normal upper airway. PEERL +. No post nasal drip        Assessment/     Assessment & Plan Interstitial lung disease due to connective tissue disease (HCC)  High risk medication use  Immunosuppressed  status  Positive QuantiFERON-TB Gold test  OSA (obstructive sleep apnea)  Acute bronchitis, unspecified organism    PLAN Patient Instructions  Acute Bronchitis  -This seems like a postviral opposed seasonal allergies bronchitis  Plan - Do CBC with differential and blood RAST allergy panel - Albuterol  nebulizer/Combivent nebulizer treatment in the office Shannon [name] 1 - Continue regular inhalers - Do 10-day prednisone  taper [sent to pharmacy] - Await results of CT chest 04/15/2024 [I have reached out to the radiology department to get the scan image uploaded]   Interstitial lung disease due to connective tissue disease (HCC) High risk medication use Immunosuppressed status  Pulmonary function test WAS showing/suggesting continued decline sept 2022-> June 2025 but on 02/25/2024 bounced back and is better than Nov 2024 but still worse than 2022  This suggests =- stable Shannon [name] 1 year but likely worse over 3 years  Off prednisone  since 2021   Plan ' -Continue CellCept  through the rheumatologist at [facility] -Continue Plaquenil  through the rheumatologist at [facility] -If pulmonary fibrosis gets worse then we can add nintedanib - CMA to cancel HRCT ordered for 6 months - Do spirometry and DLCO in 8 months   Quantiferon Gold Positive Aug 2025 at [facility]  - per Public health depatent  History of snoring Witnessed apneic spells OSA    - glad you are seeing Dr MALVA and are on  CPAP  Plan  - per Dr MALVA    Follow-up -Call us  if you are not better from the cough -Return to see Dr. Geronimo 15-minute ILD slot in  8 weeks  after spirometry and DLCO  - symptoms score and walk test at followup    FOLLOWUP    Return for -Return to see Dr. Geronimo 15-minute ILD slot in  8 weeks  after spirometry and DLCO .    SIGNATURE    Dr. Dorethia Geronimo, M.D., F.C.C.P,  Pulmonary and Critical Care Medicine Staff Physician, [facility] Center Director - Interstitial  Lung Disease  Program  Pulmonary Fibrosis [facility] Network at [facility] [place], [place], [address]  Pager: [phone], If no answer or between  15:00h - 7:00h: call [phone] Telephone: [phone]  1:50 PM 04/19/2024

## 2024-04-18 NOTE — Patient Instructions (Incomplete)
 Interstitial lung disease due to connective tissue disease (HCC) High risk medication use Immunosuppressed status  Pulmonary function test WAS showing/suggesting continued decline sept 2022-> June 2025 but on 02/25/2024 bounced back and is better than Nov 2024 but still worse than 2022  This suggests =- stable x 1 year but likely worse over 3 years   Plan ' -Continue CellCept  through the rheumatologist at [facility] -Continue Plaquenil  through the rheumatologist at [facility] -Noted that you are off prednisone  since 2021  -If pulmonary fibrosis gets worse then we can add nintedanib -Get  HRCT in  6months    Quantiferon Gold Positive Aug 2025 at [facility]  - per Public health depatent  History of snoring Witnessed apneic spells OSA    - glad you are seeing Dr MALVA and are on CPAP  Plan  - per Dr MALVA    Follow-up -Return to see Dr. Geronimo 15-minute ILD slot in 6 months after HRCT  - symptoms score and walk test in 4 months

## 2024-04-19 ENCOUNTER — Encounter: Payer: Self-pay | Admitting: Internal Medicine

## 2024-04-19 ENCOUNTER — Ambulatory Visit: Admitting: Internal Medicine

## 2024-04-19 ENCOUNTER — Other Ambulatory Visit: Payer: Self-pay | Admitting: Primary Care

## 2024-04-19 ENCOUNTER — Telehealth: Payer: Self-pay | Admitting: Internal Medicine

## 2024-04-19 VITALS — BP 124/76 | HR 109 | Temp 98.1°F | Ht 71.0 in | Wt 299.4 lb

## 2024-04-19 DIAGNOSIS — G4733 Obstructive sleep apnea (adult) (pediatric): Secondary | ICD-10-CM | POA: Diagnosis not present

## 2024-04-19 DIAGNOSIS — M359 Systemic involvement of connective tissue, unspecified: Secondary | ICD-10-CM | POA: Diagnosis not present

## 2024-04-19 DIAGNOSIS — J209 Acute bronchitis, unspecified: Secondary | ICD-10-CM

## 2024-04-19 DIAGNOSIS — J8489 Other specified interstitial pulmonary diseases: Secondary | ICD-10-CM

## 2024-04-19 DIAGNOSIS — Z79899 Other long term (current) drug therapy: Secondary | ICD-10-CM | POA: Diagnosis not present

## 2024-04-19 DIAGNOSIS — D849 Immunodeficiency, unspecified: Secondary | ICD-10-CM

## 2024-04-19 DIAGNOSIS — R7612 Nonspecific reaction to cell mediated immunity measurement of gamma interferon antigen response without active tuberculosis: Secondary | ICD-10-CM

## 2024-04-19 LAB — CBC WITH DIFFERENTIAL/PLATELET
Basophils Absolute: 0.1 K/uL (ref 0.0–0.1)
Basophils Relative: 0.7 % (ref 0.0–3.0)
Eosinophils Absolute: 0.1 K/uL (ref 0.0–0.7)
Eosinophils Relative: 0.9 % (ref 0.0–5.0)
HCT: 34.4 % — ABNORMAL LOW (ref 36.0–46.0)
Hemoglobin: 11.3 g/dL — ABNORMAL LOW (ref 12.0–15.0)
Lymphocytes Relative: 40.9 % (ref 12.0–46.0)
Lymphs Abs: 2.8 K/uL (ref 0.7–4.0)
MCHC: 32.8 g/dL (ref 30.0–36.0)
MCV: 89 fl (ref 78.0–100.0)
Monocytes Absolute: 0.2 K/uL (ref 0.1–1.0)
Monocytes Relative: 3.6 % (ref 3.0–12.0)
Neutro Abs: 3.7 K/uL (ref 1.4–7.7)
Neutrophils Relative %: 53.9 % (ref 43.0–77.0)
Platelets: 293 K/uL (ref 150.0–400.0)
RBC: 3.87 Mil/uL (ref 3.87–5.11)
RDW: 14 % (ref 11.5–15.5)
WBC: 6.8 K/uL (ref 4.0–10.5)

## 2024-04-19 MED ORDER — PREDNISONE 10 MG PO TABS: ORAL_TABLET | ORAL | 0 refills | Status: AC

## 2024-04-19 NOTE — Telephone Encounter (Signed)
     For radiology    - please call radiologist assistant line [phone] and specify Shannon [name] , 07/04/1970 and [phone] - mention imaging type CT chest  and date 04/15/24 - request addendum for purpose of  - NEED IMAGE UPLOADED And need results   Please send phone message back when done  Thanks    SIGNATURE    Dr. Dorethia Cave, M.D., F.C.C.P,  Pulmonary and Critical Care Medicine Staff Physician, [facility] Center Director - Interstitial Lung Disease  Program  Pulmonary Fibrosis [facility] Network at [facility] [place], [place], [address]  Pager: [phone], If no answer  OR between  19:00-7:00h: page [phone] Telephone (clinical office): [phone] Telephone (research): [phone]  1:43 PM 04/19/2024

## 2024-04-20 ENCOUNTER — Telehealth: Payer: Self-pay | Admitting: *Deleted

## 2024-04-20 NOTE — Telephone Encounter (Signed)
 FYI pharmacist questioning reason Tramadol given due to patient already taking oxycodone . Explained pulmonary prescribes to assist with chronic cough and it's only [name] 5 day supply.  Copied from CRM #[id number]. Topic: Clinical - Prescription Issue >> Apr 19, 2024  3:39 PM Shannon [name] wrote: Reason for CRM:  Duplicate encounter - [facility] requesting [name] call back.  Callback number: [phone]       [facility] is calling concerning pt's prescribed Tramadol that was sent in on 10/20. Pt is already prescribed oxycodone , and pharmacy wants to verify provider is wanting to prescribed this medication or if he is making changes to her treatment, leading to prescribing Tramadol

## 2024-04-20 NOTE — Telephone Encounter (Signed)
 Images appear to be uploaded and resultted.

## 2024-04-22 LAB — ALLERGEN PROFILE, PERENNIAL ALLERGEN IGE

## 2024-05-02 DIAGNOSIS — R7612 Nonspecific reaction to cell mediated immunity measurement of gamma interferon antigen response without active tuberculosis: Secondary | ICD-10-CM | POA: Diagnosis not present

## 2024-05-03 DIAGNOSIS — F332 Major depressive disorder, recurrent severe without psychotic features: Secondary | ICD-10-CM | POA: Diagnosis not present

## 2024-05-03 DIAGNOSIS — F411 Generalized anxiety disorder: Secondary | ICD-10-CM | POA: Diagnosis not present

## 2024-05-05 ENCOUNTER — Encounter: Payer: Self-pay | Admitting: Internal Medicine

## 2024-05-06 NOTE — Telephone Encounter (Signed)
**Note De-identified  [place] Obfuscation** Please advise 

## 2024-05-08 NOTE — Telephone Encounter (Signed)
 Hi  1) allergy rest is normal 2) ct 04/15/24 - just the mild ILD 3)so suspect cough is post viral -  yes It can be seere but typically settles down in 1-6 months 4) but beefore I give more Rx - a) did the prednisone  help? - b) who gave her tramadol - there was a call from pharmacy abut it - I do not recall giving it C) is she taking elavil , wellbutrin , oxycodone  as part of regular medicines? - LMK - if so it makes giving more medicine outside of steroids  for cough  more challenging     Current Outpatient Medications:    albuterol  (VENTOLIN  HFA) 108 (90 Base) MCG/ACT inhaler, Inhale 2 puffs into the lungs every 6 (six) hours as needed for wheezing or shortness of breath., Disp: 1 each, Rfl: 5   amitriptyline  (ELAVIL ) 100 MG tablet, Take 100 mg by mouth at bedtime., Disp: , Rfl:    budesonide -glycopyrrolate-formoterol  (BREZTRI  AEROSPHERE) 160-9-4.8 MCG/ACT AERO inhaler, INHALE 2 PUFFS IN THE MORNING AND AT BEDTIME, Disp: 11 g, Rfl: 11   buPROPion  (WELLBUTRIN  XL) 300 MG 24 hr tablet, Take 300 mg by mouth daily., Disp: , Rfl:    busPIRone  (BUSPAR ) 15 MG tablet, Take 15 mg by mouth daily., Disp: , Rfl:    cetirizine  (ZYRTEC ) 10 MG tablet, TAKE 1 TABLET BY MOUTH AT BEDTIME, Disp: 30 tablet, Rfl: 1   clindamycin  (CLEOCIN ) 300 MG capsule, Take 1 capsule (300 mg total) by mouth 3 (three) times daily., Disp: 30 capsule, Rfl: 0   cloNIDine  (CATAPRES ) 0.1 MG tablet, Take 0.1 mg by mouth at bedtime., Disp: , Rfl:    famotidine  (PEPCID ) 20 MG tablet, Take 20 mg by mouth 2 (two) times daily., Disp: , Rfl:    hydroxychloroquine  (PLAQUENIL ) 200 MG tablet, Take 400 mg by mouth at bedtime., Disp: , Rfl:    ibuprofen  (ADVIL ) 800 MG tablet, Take 1 tablet (800 mg total) by mouth 3 (three) times daily., Disp: 21 tablet, Rfl: 0   isoniazid  (NYDRAZID) 300 MG tablet, Take by mouth daily., Disp: , Rfl:    Magnesium  250 MG CAPS, Take 1 capsule by mouth daily., Disp: , Rfl:    metoprolol  succinate (TOPROL -XL) 25 MG  24 hr tablet, Take 25 mg by mouth daily., Disp: , Rfl:    montelukast  (SINGULAIR ) 10 MG tablet, TAKE 1 TABLET BY MOUTH AT BEDTIME, Disp: 30 tablet, Rfl: 11   mycophenolate  (CELLCEPT ) 500 MG tablet, Take 1,000 mg by mouth 2 (two) times daily., Disp: , Rfl:    MYRBETRIQ  25 MG TB24 tablet, Take 25 mg by mouth daily., Disp: , Rfl:    omeprazole (PRILOSEC) 20 MG capsule, Take 20 mg by mouth daily., Disp: , Rfl:    Oxycodone  HCl 10 MG TABS, Take 10 mg by mouth 4 (four) times daily as needed (Pain)., Disp: , Rfl:    predniSONE  (DELTASONE ) 10 MG tablet, Take prednisone  40 mg daily x 2 days, then 30mg  daily x 2 days 20mg  daily x 2 days, then 10mg  daily x 2 days, then 5mg  daily x 2 days and then  STOP, Disp: 22 tablet, Rfl: 0   pyridOXINE (VITAMIN B6) 25 MG tablet, Take 25 mg by mouth daily., Disp: , Rfl:    QUEtiapine  (SEROQUEL  XR) 300 MG 24 hr tablet, Take 300 mg by mouth at bedtime., Disp: , Rfl:    rifampin (RIFADIN) 150 MG capsule, Take 150 mg by mouth daily., Disp: , Rfl:    sertraline  (ZOLOFT ) 100 MG  tablet, Take 200 mg by mouth at bedtime., Disp: , Rfl: 0   VEOZAH  45 MG TABS, Take 1 tablet by mouth daily., Disp: , Rfl:

## 2024-05-09 NOTE — Telephone Encounter (Signed)
 Patient finished her prednisone , she is on Tramadol, that was prescribed by Dr. Zaida and she is on oxycodone .  She is no longer on Elavil  or Wellbutrin .

## 2024-05-11 NOTE — Telephone Encounter (Signed)
 Okay in this case because she is not on Elavil  or Wellbutrin  I think it is okay to prescribe gabapentin .  There is no allergy reported to gabapentin  but please to tell her it can make her sleepy or groggy or drowsy and we would go up slowly on the dose.  It takes a week or 2 to help the cough.  If she is interested in this then I can send a prescription.  There is no guarantee it will help.  If she gets side effects it is reversible  Let me know what she states    Allergies  Allergen Reactions   Asa [Aspirin ] Hives and Shortness Of Breath    Chest tightness    Penicillins Hives     Tolerated Cephalosporin Date: 05/01/20.     Sulfa Antibiotics     G6PD deficiency

## 2024-05-12 DIAGNOSIS — Z79891 Long term (current) use of opiate analgesic: Secondary | ICD-10-CM | POA: Diagnosis not present

## 2024-05-18 DIAGNOSIS — R234 Changes in skin texture: Secondary | ICD-10-CM | POA: Diagnosis not present

## 2024-05-18 DIAGNOSIS — L603 Nail dystrophy: Secondary | ICD-10-CM | POA: Diagnosis not present

## 2024-05-18 DIAGNOSIS — B353 Tinea pedis: Secondary | ICD-10-CM | POA: Diagnosis not present

## 2024-05-20 ENCOUNTER — Other Ambulatory Visit: Payer: Self-pay | Admitting: Primary Care

## 2024-05-24 NOTE — Telephone Encounter (Signed)
 Pt requesting refill of Zyrtec , not mentioned in LOV to continue or d/c. Please advise, thank you!

## 2024-07-01 ENCOUNTER — Ambulatory Visit: Admitting: *Deleted

## 2024-07-01 ENCOUNTER — Encounter: Payer: Self-pay | Admitting: Internal Medicine

## 2024-07-01 ENCOUNTER — Ambulatory Visit: Admitting: Internal Medicine

## 2024-07-01 VITALS — BP 128/84 | HR 104 | Ht 71.0 in | Wt 301.0 lb

## 2024-07-01 DIAGNOSIS — R053 Chronic cough: Secondary | ICD-10-CM

## 2024-07-01 DIAGNOSIS — M359 Systemic involvement of connective tissue, unspecified: Secondary | ICD-10-CM

## 2024-07-01 DIAGNOSIS — J849 Interstitial pulmonary disease, unspecified: Secondary | ICD-10-CM | POA: Diagnosis not present

## 2024-07-01 DIAGNOSIS — G4733 Obstructive sleep apnea (adult) (pediatric): Secondary | ICD-10-CM | POA: Diagnosis not present

## 2024-07-01 DIAGNOSIS — J209 Acute bronchitis, unspecified: Secondary | ICD-10-CM | POA: Diagnosis not present

## 2024-07-01 DIAGNOSIS — R7612 Nonspecific reaction to cell mediated immunity measurement of gamma interferon antigen response without active tuberculosis: Secondary | ICD-10-CM

## 2024-07-01 DIAGNOSIS — Z87891 Personal history of nicotine dependence: Secondary | ICD-10-CM | POA: Diagnosis not present

## 2024-07-01 DIAGNOSIS — Z79899 Other long term (current) drug therapy: Secondary | ICD-10-CM

## 2024-07-01 DIAGNOSIS — D849 Immunodeficiency, unspecified: Secondary | ICD-10-CM

## 2024-07-01 LAB — PULMONARY FUNCTION TEST
DL/VA % pred: 102 %
DL/VA: 4.24 ml/min/mmHg/L
DLCO cor % pred: 57 %
DLCO cor: 14.41 ml/min/mmHg
DLCO unc % pred: 57 %
DLCO unc: 14.41 ml/min/mmHg
FEF 25-75 Pre: 2.76 L/s
FEF2575-%Pred-Pre: 90 %
FEV1-%Pred-Pre: 59 %
FEV1-Pre: 1.98 L
FEV1FVC-%Pred-Pre: 106 %
FEV6-%Pred-Pre: 56 %
FEV6-Pre: 2.32 L
FEV6FVC-%Pred-Pre: 102 %
FVC-%Pred-Pre: 55 %
FVC-Pre: 2.32 L
Pre FEV1/FVC ratio: 85 %
Pre FEV6/FVC Ratio: 100 %

## 2024-07-01 NOTE — Patient Instructions (Signed)
 Spirometry and diffusion capacity performed today.

## 2024-07-01 NOTE — Patient Instructions (Addendum)
 Interstitial lung disease due to connective tissue disease (HCC) High risk medication use Immunosuppressed status  Pulmonary function worse than 2022 but stable 2024 -> 2026 JAn CT chest also shows stable ILD Off prednisone  since 2021 Glad cough is resolved   Plan ' -Continue CellCept  through the rheumatologist at [facility] -Continue Plaquenil  through the rheumatologist at [facility] -If pulmonary fibrosis gets worse then we can add  JASCAYD - Do spirometry and DLCO in 8 months   Quantiferon Gold Positive Aug 2025 at [facility]  - per Public health depatent  History of snoring Witnessed apneic spells OSA    - glad you are seeing Dr MALVA and are on CPAP  Plan  - per Dr MALVA    Follow-up -Return to see Dr. Geronimo 15-minute ILD slot in  8 months  after spirometry and DLCO  - symptoms score and walk test at followup

## 2024-07-01 NOTE — Progress Notes (Signed)
 Spirometry and diffusion capacity performed today.

## 2024-07-01 NOTE — Progress Notes (Signed)
 "      PCP [name],[name], PA-C Referred by [name]  HPI   IOV 08/23/2014 His blood work and a CT scan in a few weeks and come back c Chief Complaint  Patient presents with   Pulmonary Consult    [name] for ILD. Pt c/o SOB with activity and rest, dry cough and chest tightness also with and without activity.    54 year old female referred for evaluation of autoimmune interstitial lung disease. She presents with her husband. In 2010 while living in [place] she reports she was diagnosed with lupus associated with rheumatoid arthritis in her joints. In 2012 she moved to live in [place]  and several months after that started noticing insidious onset of shortness of breath. Local rheumatologist diagnosed her with interstitial lung disease. She was referred to [name] at [facility] in [facility] and was started on CellCept /prednisone  for autoimmune interstitial lung disease he and however, sometime around 2 years ago she ran out of medical insurance and stopped taking these medications. During this time her dyspnea has progressed. It is currently rated as moderate to severe. It is present on exertion and relieved by rest. Even minimal amount of exertion makes her very ddyspneic. Now she has insurance and she did see [name] locally and she has autoimmune panel lab ordered. In addition exam revealed crackles and there for she's been referred here for reevaluation of interstitial lung disease and dyspnea. Dyspnea is associated with some chest tightness but no chest pain. This no associated dizzine   11/24/2014 Follow UP ILD  [name]  She has autoimmune ILD with RA/Lupus  She was seen 6 weeks ago, restarted on Cellcept .  Previously on cellcept  but lost her insurance until recently.  On low dose prednisone  5mg  daily .  Last CT chest 4/4 showed ILD changes similar to 2013. Echo was ok with EFG 55-60%, nml PAP . In  March .   Did not take dapsone  ,due to  GP6D deficiency.  Labs ok last week with nml LFT , no sign change in hbg. /wbc.  She is feeling better. Does feel her breathing is some better.  No flare of cough or wheezing.     OV 01/16/2015  Chief Complaint  Patient presents with   Follow-up    Pt c/o DOE, mild dry cough, and chest tightness when SOB. Pt states the chest tightness has improved).    follow-up interstitial lung disease in the setting of rheumatoid arthritis Follow-up high risk medication use - on CellCept  and prednisone  since mid April 2016  - Presents with her husband. Both give a history. Overall she is doing better in terms of dyspnea after starting CellCept  and prednisone . However the improvement this only moderate. She still left with a residual moderate dyspnea on exertion that is also made worse with bending or heart air and improved with rest and cool air. It is associated with some cough and wheezing. She takes albuterol  inhaler which she feels helps only somewhat but not fully and not quickly enough. She is frustrated by this. In addition she's complaining of some associated right lower back paraspinal spasm for which massage gives her relief. He has never attended pulmonary rehabilitation.  - Lab review shows she has had problems with potassium and has had potassium supplementation. Last lab check was 12/08/2014. She is due for lab test right now   OV 03/28/2015   Chief Complaint  Patient presents with   Follow-up    3 month follow up. Pt states that she is still having some problems with her breathing. Pt c/o of feeling chest tightness, chest pain and cough that is dry. Pt denies wheeze. Pt states that she did trial the Advair and does feel that it helped some.   Follow-up interstitial lung disease secondary to autoimmune process and associated dyspnea that seems out of proportion\  She presents with her husband. She continues on CellCept  and prednisone . In the last  visit approximately 2-3 months ago she had out of proportion dyspnea. I gave her some Advair to try. She says this only helped a little bit. Overall she says that dyspnea still persists. It is worse than in the spring 2016 when she started CellCept  and prednisone . It is stable since July 2016. It is moderate in intensity. Occurs randomly at rest but also with exertion. Occasionally relieved by rest but also happens at rest. Heat and humidity make it worse. Advair helped a little bit only. This no associated chest tightness or wheezing. She did try and enroll  in pulmonary rehabilitation but could not afford the the startup program and is waiting to hear from them for the maintenance program. She is frustrated by all the symptoms.  Pulmonary function test October 15,016 today FVC 2.55 L/64%, total lung capacity 4 L/65% and diffusion 14.5/42%. Overall no change since April 2016   OV 07/04/2015  Chief Complaint  Patient presents with   Follow-up    pt. states breathing is baseline. SOB. dry cough. wheezing. occ. chest pain/tightness. feels her airway is blocked.   Follow-up interstitial lung disease admitted autoimmune processes and associated out of proportion dyspnea  She presents again with her husband. She continues on CellCept  2 g twice daily associated with prednisone . She cannot take Bactrim or dapsone  due to G6PD deficiency. Last seen in the fall of 2016. She was having out of proportion dyspnea. Rated cardia pulmonary stress test and she could not tolerate this test. She then underwent right heart catheterization mid-November 2016 with Dr. Ezra Shuck. Review the tests this is normal. Overall stable but she and husband still continued to complain about this resting dyspnea associated with wheezing. They hear noises in the chest. Sometime she gasps for air even at rest. She feels it comes from the chest but the husband points to the throat. I offered second opinion at [facility] because of  this unusual symptoms but they declined citing distance. She was supposed to attend pulmonary rehabilitation but they cannot afford a $60 co-pay twice a week 8 weeks. Offered ENT evaluation that agreeable but they wanted done in [place] which is closer logistically. In addition patient is contemplating now applying for disability. 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  Walking desaturation test 185 feet 3 laps and rheumatic: No desaturation    OV 10/12/2015  Chief Complaint  Patient presents with   Follow-up    PFT today.  breathing is better.  Pt is currently on STD, [employer] approved STD until today, wants to know today if pt is able to return to work, any restrictions.  Pt states that she has been more stable while out of work.      Follow-up interstitial lung disease due to autoimmune processes not otherwise specified  Last seen January 2017. Since then she has been on short-term disability and out of work. She does heavy manual work. The lack of work estimated dyspnea better. She  did pulmonary function test today that I personally visualized and overall it is same. There are no new issues. She is on CellCept  and she is tolerating this fine. Last visit she had some anemia we follow this up and the anemia was better. Also hypokalemia resolved by February 2017. She is due for blood work today. 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 This showed up at last visit. She saw an ENT in [place] within referred her to [facility]. She does not want to go to [facility]. She ended up seeing Dr. Ethyl locally. But now she is going to see Dr.  Daniel Shiley [facility]   Pulmonary function test today 10/12/2015 shows postbronchodilator FVC 2.65 L/67%. FEV1 2.34 L/72% which is up 17% positive bronchodilator response. Ratio of 80/106%. Total lung capacity of 3.8/62%. DLCO 18.36/52%.  Overall consistent with restriction and lung parenchymal disease. Overall PFTs are stable compared to April 2016 but perhaps in DLCO slightly better   OV 04/15/2016  Chief Complaint  Patient presents with   Interstitial Lung Disease    Breathing is unchanged since last OV. Reports SOB, coughing. Cough is non productive. Denies chest tightness or wheezing.   Follow-up interstitial lung disease due to autoimmune processes not otherwise specified. On CellCept  and prednisone . Not on Bactrim prophylaxis due to G6PD deficiency  Last visit April 2017. At that time based on pulmonary function tests showed stability and ILD for a year. 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 She did see local ENT doctor [place] and apparently has been reassured. At this point in time she is applying for long-term disability at work but the work discharged from services and she is now applying for Social Security disability. Her dyspnea stable since the interim. She's also had hysterectomy but for the last few weeks her cough is worse and it is dry. This no fever. Is no weight loss or chills. She says she's compliant with her CellCept  and prednisone .  OV 05/29/2016  Chief Complaint  Patient presents with   Follow-up    Pt states she still has harsh dry cough, pt states her SOB is unchanged and chest tightness when SOB or when coughing a lot. Pt deneis f/c/s.     Follow-up interstitial lung disease due to autoimmune processes not otherwise specified. On CellCept  and prednisone  since April 2016. Not on Bactrim prophylaxis due to G6PD deficiency  Sashay returns for follow-up. She had high resolution CT chest that shows stability and interstitial lung disease since April 2016. She Pulmicort  function test that shows mild improvement since April 2016. Therefore it appears that her CellCept  and prednisone  is helping her she'll lung disease related to collagen-vascular disease. However she  tells me that she continues to have chronic cough for the last few to several months. It is progressive. It is dry. It is worse than her baseline. The dyspnea is unchanged. This no fever or weight loss or chills   OV 07/24/2016  Chief Complaint  Patient presents with   Follow-up    Pt states she feels the Arnuity has been helping her breathing. Pt c/o dry cough and occ chest tightness.   rec    ICD-9-CM ICD-10-CM   1. ILD (interstitial lung disease) (HCC) 515 J84.9   2. Chronic cough 786.2 R05   3. High risk medication use V58.69 Z79.899    Clinically  improved with cellcept /prednisone  and arnuity Blood work ok dec 2017  pLAN - start pulm rehab at [facility] - continue  aruity daily - continue cellcept  and prednisone   Followup - 6 months do Pre-bd spiro and dlco only. No lung volume or bd response. No post-bd spiro - 6 months fu Dr Geronimo after above or sooner if needed     12/15/2016  f/u ov/Wert re: cough dry on arnuity  Chief Complaint  Patient presents with   Follow-up    Breathing is unchanged since her last visit. Pt states she is here to f/u on recent ABG result. She c/o feeling tired all of the time. She has occ cough- non prod.       Finished at Rehab   beginning  Of May  2018 and trouble walking fast or uphill Waking up with ha's since rehab sev days a week  Cough is new x one month mostly dry and day > noct   No obvious day to day or daytime variability or assoc excess/ purulent sputum or mucus plugs or hemoptysis or cp or chest tightness, subjective wheeze or overt sinus or hb symptoms. No unusual exp hx or h/o childhood pna/ asthma or knowledge of premature birth.   OV 02/13/2017  Chief Complaint  Patient presents with   Follow-up    cough/ILD follow up - prod cough with brownish/green mucus with tightness and chest pain x2 days.  denies any f/c/s, hemoptysis.  PFT done today.    Follow-up interstitial lung disease due to autoimmune processes not  otherwise specified. On CellCept  and prednisone  since April 2016. Not on Bactrim prophylaxis due to G6PD deficiency  54 year old female immunosuppressed with CellCept  and prednisone . Not seen her in many months. Currently taking prednisone  3 mg per day and CellCept  1000 mg twice daily. She cannot do Bactrim prophylaxis because of G6PD deficiency. She tells me that overall her health has been stable but in the last few weeks noticing increased shortness of breath in the last few days this increased cough and change in color of sputum to green and increased chest tightness and a feeling that she is getting acute bronchitis. She also was contemplated going to the emergency room a few days ago but now she is better. There is no obvious fever or chills or hemoptysis or edema paroxysmal nocturnal dyspnea or orthopnea. Pulmonary function test today shows 10% decline in FVC and DLCO compared to baseline.   OV 06/08/2017  Chief Complaint  Patient presents with   Follow-up    Feeling about the same as last visit. Still having chest tightness and wheezing at times, Sounds hoarse.     Follow-up interstitial lung disease due to autoimmune processes not otherwise specified. On CellCept  and prednisone  since April 2016. Not on Bactrim prophylaxis due to G6PD deficiency. Normal Right heart cath Nov 2016.  Last high-resolution CT November 2017  Last visit August 2018.  There is a routine follow-up.  Overall she feels stable.  FVC shows stability since August 2018 but declined since 1 year ago.  DLCO shows continued decline.  Though her lung health is stable.  She says she lost her 1 only biological sister last week due to lupus.  The sister was only 75 and lived in [place].  There are no new issues. Walking desaturation test on 06/08/2017 185 feet x 3 laps on ROOM AIR:  did not desaturate. Rest pulse ox was 100%%, final pulse ox was 100%. HR response 81/min at rest to 109/min  at peak exertion. Patient CYNDA SOULE  Did not Desaturate < 88% . Shannon [name] did nto  Desaturated </= 3% points. JAMEAH ROUSER yes did get tachyardic   OV 10/15/2017   Follow-up interstitial lung disease due to autoimmune processes not otherwise specified. On CellCept  and prednisone  since April 2016. Not on Bactrim prophylaxis due to G6PD deficiency. Normal Right heart cath Nov 2016 and feb 2019.  Last high-resolution CT November 2017 and dec 2018 without progresion   Last visit December 2018.  This is a routine follow-up.  In the interim she had high-resolution CT scan of the chest that did not show any progression in interstitial lung disease between 2017 and 2018.SABRA  Therefore we did an echocardiogram that showed slight elevation in pulmonary artery systolic pressure.  Therefore we sent it to her repeat right heart catheterization and this was normal as documented below.  Overall she feels stable compared to one year ago but has declined compared to 2 years ago.  She is also complaining about a new recurrence of cough that happens mostly at night despite Symbicort  and prednisone  and CellCept .  It wakes her up.  It is moderate in intensity.  There is no associated wheezing or edema orthopnea.  It happens randomly.  There is associated heartburn.  She is on nothing for acid reflux.       Right Heart Pressures 08/20/17 [facility] Procedural Findings: Hemodynamics (mmHg) RA mean 3 RV 30/6 PA 23/8, mean 14 PCWP mean 8  Oxygen  saturations: PA 72% AO 98%  Cardiac Output (Fick) 6.73  Cardiac Index (Fick) 3.06  Cardiac Output (Thermo) 6.94 Cardiac Index (Thermo) 3.15    OV 12/01/2017  Chief Complaint  Patient presents with   Follow-up    Pt has SOB with exertion, wheezing, some chest tightness. Pt was dry cough more at bedtime.      Follow-up interstitial lung disease due to autoimmune processes not otherwise specified. On CellCept  and prednisone  since April 2016. Not on Bactrim prophylaxis due to G6PD deficiency.  Normal Right heart cath Nov 2016 and feb 2019.  Last high-resolution CT November 2017 and dec 2018 without progresion   This visit is to see if she is got progressive interstitial lung disease.  Last visit she was complaining of more cough.  Therefore we added some acid reflux treatment.  She says after the acid reflux treatment symptoms have actually improved.  This contradicts what she told the CMA at intake.  Overall she is feeling stable.  However she does have a new episode of orthostatic dizziness going on at a mild level for the last 1 week.  Today after doing pulmonary function test she did feel dizzy.  Therefore we checked orthostatics on her and she got tachycardic standing up and her blood pressure did drop although still within normal limits.  She does not have any chest pain.  Lung function test shows continued stability in the last 1 year including compared to the most recent attempt.  OV 03/08/2018  Subjective:  Patient ID: Shannon [name], female , DOB: 09-14-1970 , age 54 y.o. , MRN: [id number] , ADDRESS: [address]  [place] [facility] [address]   03/08/2018 -   Chief Complaint  Patient presents with   Follow-up    Spirometry performed today. Pt states she is still having some mild problems with dizziness but not as bad as last visit. Pt also states she has been having problems with headaches x1 month. Pt states her  breathing is about the same as last visit and also states she still has the dry cough.    Follow-up interstitial lung disease due to autoimmune processes not otherwise specified. On CellCept  and prednisone  since April 2016. Not on Bactrim prophylaxis due to G6PD deficiency. Normal Right heart cath Nov 2016 and feb 2019.  Last high-resolution CT November 2017 and dec 2018 without progresion     HPI Shannon [name] 54 y.o. - connective tissue disease ILD. Follow-up. Last seen June 2019 by she started having new onset dizziness. It seemed orthostatic. She says since then it is  spontaneously improved but still persists. She is also dealing with fatigue issues. In 02/26/2018 she felt she had a lupus flare saw Dr. JONETTA and given a prednisone  burst. Autoimmune profile at that time showed continued positive ANA titer but normal complements. She had a CBC that showed a drop in hemoglobin by 1 g percent. Her baseline is around 11.5 g percent and currently it is around 10.5 g percent. She says after the prednisone  burst a lupus flare symptoms and fatigue have improved although it still persists. If it is actually worse in the last few months. She had pulmonary function test that shows continued improvement especially following the recent prednisone  burst. Walking desaturation test was normal other than tachycardia. We observed her to be walking very slowly in a very deconditioned and fatigued fashion. 02/26/2089 and liver function and renal function reviewed and this was normal. Medication review shows she is on oxycodone  and gabapentin  for back pain. She says she's been on gabapentin  for the last 1 year. It appears that the temporal sequence of fatigue and dizziness might be related to gabapentin  but she is not so sure.       Results for RASHEMA, SEAWRIGHT (MRN [id number]) as of 03/08/2018 10:51  Ref. Range 02/26/2018 10:53  Hemoglobin Latest Ref Range: 11.7 - 15.5 g/dL 89.4 (L)  Results for RUDOLPH, DOBLER (MRN [id number]) as of 03/08/2018 10:51  Ref. Range 02/26/2018 10:53  Creatinine Latest Ref Range: 0.50 - 1.10 mg/dL 9.26  Results for CHAYE, MISCH (MRN [id number]) as of 03/08/2018 10:51  Ref. Range 02/26/2018 10:53  AST Latest Ref Range: 10 - 35 U/L 19  ALT Latest Ref Range: 6 - 29 U/L 18   Results for JENIFER, STRUVE (MRN [id number]) as of 03/08/2018 10:51  Ref. Range 02/26/2018 10:53  Anti Nuclear Antibody(ANA) Latest Ref Range: NEGATIVE  POSITIVE (A)  ANA Pattern 1 Unknown SPECKLED (A)  ANA Titer 1 Latest Units: titer > OR = 1:1280 (A)  ds DNA Ab Latest Units: IU/mL <1  C3  Complement Latest Ref Range: 83 - 193 mg/dL 864  C4 Complement Latest Ref Range: 15 - 57 mg/dL 33    ROS - per HPI    OV 06/03/2018  Subjective:  Patient ID: Shannon [name], female , DOB: December 21, 1970 , age 62 y.o. , MRN: [id number] , ADDRESS: [address]  [place] [facility] [facility] [address]   06/03/2018 -   Chief Complaint  Patient presents with   Follow-up    pt states breathing is baseline. c/o sob with exertion, non prod cough & wheezing    Follow-up interstitial lung disease due to autoimmune processes not otherwise specified. On CellCept  and prednisone  since April 2016. Not on Bactrim prophylaxis due to G6PD deficiency. Normal Right heart cath Nov 2016 and feb 2019.  Last high-resolution CT November 2017 and dec 2018 without progresion   HPI Shannon [name] 54  y.o. -presents for follow-up.  Last visit September 2019.  At that time CBC showed anemia.  We repeated the hemoglobin 1 month later and was stable around 10 g%.  She tells me that overall she is stable.  Last visit she had dizziness and I told her to stop gabapentin  which she did and her dizziness and fatigue have improved.  She feels she is stable but in the last 3 days she has had a dry cough but no fever or congestion or wheezing or hemoptysis or edema.  No worsening shortness of breath or chest tightness.  Other than that she feels good.  She did up walking desaturation test today and it appears similar to previous visit.       OV 05/13/2019  Subjective:  Patient ID: Shannon [name], female , DOB: [birth year] , age 44 y.o. , MRN: [id number] , ADDRESS: [address] [place] [address]   05/13/2019 -   Chief Complaint  Patient presents with   Follow-up    Pt just had total left knee replacement mid October 2020. Pt said she still has problems with SOB especially with going upstairs. Pt also has occ wheezing but denies any real complaints of cough.   Follow-up interstitial lung disease due to autoimmune processes not  otherwise specified. On CellCept  and prednisone  since April 2016. Not on Bactrim prophylaxis due to G6PD deficiency. Normal Right heart cath Nov 2016 and feb 2019.  Last high-resolution CT November 2017 and dec 2018 without progression. Last PFT Sept 2019  HPI Shannon [name] 54 y.o. -returns for follow-up.  Last seen in September 2019 and because of the pandemic things got delayed.  She was supposed to see me in 6 months with spirometry and high-resolution CT chest for progression but this did not happen.  At this follow-up we do not have those data points as yet.  She tells me the interim she has had knee surgery and therefore she is not able to do a simple walk test with us .  She has gained some weight.  Also in the interim she is now on Social Security disability and she is unemployed now.  She feels her shortness of breath is slightly worse particularly when walking up stairs but otherwise overall she feels stable.  I reviewed her blood work.  And it seems anemia is worse in the 9 g%.  She really denies any bleeding.  Normally she runs between 10 and 11 g%.  I noticed that she still not had a shingles vaccine.   OV 01/09/2020   Subjective:  Patient ID: Shannon [name], female , DOB: 08-Jun-1971, age 46 y.o. years. , MRN: [id number],  ADDRESS: [address] [place] [address] PCP  Venancio Pock, PA-C Providers : Treatment Team:  Attending Provider: Geronimo Amel, MD    Follow-up interstitial lung disease due to autoimmune processes not otherwise specified. On CellCept  and prednisone  since April 2016. Not on Bactrim prophylaxis due to G6PD deficiency. Normal Right heart cath Nov 2016 and feb 2019.  Last high-resolution CT November 2017 and dec 2018 and dec 2020 without progression. Last PFT Sept 2019 and now June 2021  Chief Complaint  Patient presents with   Follow-up       HPI Shannon [name] 54 y.o. -returns for follow-up.  Since her last visit she continues to be stable.   She is only on CellCept .  She is also on Plaquenil .  She is not on prednisone  or  antifibrotic's.  Her dyspnea is stable.  Her pulmonary function test is improved and a CT chest shows stability.  The main issue is that she says over the last 3 years she has gained 30 to 40 pounds of weight.  She says that she is snoring a lot according to her husband.  Apparently the husband's also noticed apneic spells.  She says she has had the Covid vaccine but is not documented in our chart.  She is yet to have the Shingrix vaccine.       Results for XIA, STOHR (MRN [id number]) as of 02/13/2017 12:16  Ref. Range 10/09/2014 09:39 03/28/2015 11:40 10/12/2015 13:42 05/06/2016 10:38 02/13/2017 10:49 06/08/2017  12/01/2017  03/08/2018  11/24/19  FVC-Pre Latest Units: L 2.54 2.55 2.53 2.63 2.32 2.31 2.38 2.46 2.67  FVC-%Pred-Pre Latest Units: % 64 64 64 67 59 59% 61% 67% 73%   Results for ELEANORE, JUNIO (MRN [id number]) as of 02/13/2017 12:16  Ref. Range 10/09/2014 09:39 03/28/2015 11:40 10/12/2015 13:42 05/06/2016 10:38 02/13/2017 10:49 06/08/2017  12/01/2017  03/08/2018  11/24/19  DLCO unc Latest Units: ml/min/mmHg 15.03 14.50 18.36 18.31 16.35 14.21 16.38 x 18.56  DLCO unc % pred Latest Units: % 43 42 53 53 47 41 47% x 72%    ROS - per HPI Results for KARISHA, MARLIN (MRN [id number]) as of 01/09/2020 16:33  Ref. Range 12/21/2019 22:12  Creatinine Latest Ref Range: 0.44 - 1.00 mg/dL 9.17  Results for MONIA, TIMMERS (MRN [id number]) as of 01/09/2020 16:33  Ref. Range 12/21/2019 22:12  Hemoglobin Latest Ref Range: 12.0 - 15.0 g/dL 89.3 (L)   IMPRESSION: dec 2020 compared to 2018 1. The appearance of the lungs remains compatible with interstitial lung disease, with a spectrum of findings considered indeterminate for usual interstitial pneumonia (UIP) per current ats guidelines. However, given the spectrum of findings and the stability of the findings compared to the prior study, this is once again strongly favored to  represent nonspecific interstitial pneumonia (NSIP).     Electronically Signed   By: Toribio Aye M.D.   On: 06/15/2019 09:42 ROS - per HPI    OV 07/27/2020  Subjective:  Patient ID: Shannon LABOR Debby, female , DOB: 1970-09-25 , age 53 y.o. , MRN: [id number] , ADDRESS: [address] [place] [address] PCP Venancio Pock, PA-C Patient Care Team: [name], [name], PA-C as PCP - General (Internal Medicine)  This Provider for this visit: Treatment Team:  Attending Provider: Geronimo Amel, MD    07/27/2020 -   Chief Complaint  Patient presents with   Follow-up    Get pft results.   Follow-up interstitial lung disease due to autoimmune processes not otherwise specified. -Lupus per [facility] notes in December 2021.   - On CellCept  and prednisone  since April 2016.  Off prednisone  in 2021  Not on Bactrim prophylaxis due to G6PD deficiency.    - Normal Right heart cath Nov 2016 and feb 2019  -  Last high-resolution CT November 2017 and dec 2018 and dec 2020 without progression.    - Last PFT Sept 2019 and now June 2021 and feb 2022  Chief Complaint  Patient presents with   Follow-up      HPI TESIA LYBRAND 54 y.o. -returns for routine ILD follow-up.  Last seen in July 2021.  Clinically she is stable.  Since last seeing me November 2020 when she had right knee surgery.  She uses a cane.  She is also  seen Dr. Ernie in sleep clinic.  She is now on CPAP and it is helping her.  Her weight gain persists.  Her mobility has therefore gone down but from a dyspnea standpoint she is stable.  She is up-to-date with her COVID vaccine.  With the booster she did get symptomatic with Sirs response.  She continues on CellCept .  She is now seeing [facility].  She does not see [facility] anymore.  She believes she has lupus.  She has an appointment upcoming in a few days.  Review of the records at [facility] indicate t that she has lupus.  She continues to  be off prednisone .  She is unable to do a walk test today because of her knee issues and also she is on a cane.  She has pulmonary function test there is a 2-3.8% decline in FVC/DLCO.  This could easily be because of weight gain.  Her symptom scores are stable.  She did have CT angiogram in the summer 2021 this ruled out pulmonary embolism.  There is no comment about ILD.  But then it is a contrast CT.    OV 03/05/2021  Subjective:  Patient ID: Shannon [name], female , DOB: Dec 25, 1970 , age 76 y.o. , MRN: [id number] , ADDRESS: [address] [place] [address] PCP Venancio Pock, PA-C Patient Care Team: [name], [name], PA-C as PCP - General (Internal Medicine)  This Provider for this visit: Treatment Team:  Attending Provider: Geronimo Amel, MD    03/05/2021 -   Chief Complaint  Patient presents with   Follow-up    PFT performed today.  Pt states she has been doing okay since last visit and states that her breathing is about the same.   Follow-up interstitial lung disease due to autoimmune processes not otherwise specified. -Lupus per [facility] clinic notes in December 2021.   - On CellCept  and prednisone  since April 2016.  Off prednisone  in 2021  Not on Bactrim prophylaxis due to G6PD deficiency.    - Normal Right heart cath Nov 2016 and feb 2019  -  Last high-resolution CT November 2017 and dec 2018 and dec 2020 without progression. - > July 2021 CTA   - Last PFT Sept 2019 and now June 2021 and feb 2022 and sept 2022  HPI SABA GOMM 54 y.o. -returns for follow-up.  She continues on her immunosuppressive regimen through [facility] program.  She is happy with the care there.  She is not on prednisone .  She uses CPAP through Dr. MALVA.  Overall she is feeling stable.  Symptom scores are stable.  She had pulmonary function test and it shows that her FVC is stable but DLCO is declined.  This correlates with a hemoglobin around 10 last done in June 2022 at  [facility].  But then she is also chronically anemic.  She is definitely not noticing any worsening in her shortness of breath.  Her walking desaturation test is also stable.  She wants her flu shot today.        PFT  OV 03/18/2022  Subjective:  Patient ID: Shannon [name], female , DOB: [birth year] , age 67 y.o. , MRN: [id number] , ADDRESS: [address] [place] [address] PCP Venancio Pock, PA-C Patient Care Team: [name], [name], PA-C as PCP - General (Internal Medicine)  This Provider for this visit: Treatment Team:  Attending Provider: Geronimo Amel, MD    03/18/2022 -   Chief  Complaint  Patient presents with   Follow-up    Pt states she is better compared to last visit and states the cough is better.   HPI Shannon [name] 54 y.o. -returns for follow-up.  I last saw her 1 year ago.  She continues on CellCept  and Plaquenil  through [facility].  Shortness of breath is stable.  She had high-resolution CT chest in March 2023 and that is without progression.  She has no other new issues.  She did in the interim tear her rotator cuff on the left side and had surgery.  She is having some sciatic problems.  She will have the flu shot today.  She follows with Dr. MALVA for sleep apnea.    CT Chest data HRCT March 2023  IMPRESSION: Basilar and subpleural predominant ground-glass and septal thickening with air trapping, similar to 12/22/2019. Assessment is somewhat limited by respiratory motion and body habitus. Nonspecific interstitial pneumonitis is favored. Findings are indeterminate for UIP per consensus guidelines: Diagnosis of Idiopathic Pulmonary Fibrosis: An Official ATS/ERS/JRS/ALAT Clinical Practice Guideline. Am JINNY Honey Crit Care Med Vol 198, Iss 5, [phone], Feb 21 2017.     Electronically Signed   By: Newell Eke M.D.   On: 08/28/2021 11:12    No results found.     OV 10/29/2022  Subjective:  Patient ID: Shannon [name], female , DOB:  Apr 01, 1971 , age 17 y.o. , MRN: [id number] , ADDRESS: [address] [place] [address] PCP Venancio Pock, PA-C Patient Care Team: [name], [name], PA-C as PCP - General (Internal Medicine)  This Provider for this visit: Treatment Team:  Attending Provider: Geronimo Amel, MD 10/29/2022 -   Chief Complaint  Patient presents with   Follow-up    No complaints.  PFT tomorrow.     HPI Shannon [name] 54 y.o. -returns for follow-up of her ILD due to lupus.  She continues to be stable.  She is supposed a pulmonary function test but she got the visit scheduled today and the pulmonary function schedule tomorrow 10/30/2022.  Nevertheless she feels stable.  She continues on CellCept  through [facility]ology.  She has no new issues.  Her main complaint is that she has low backache because of degenerative disc disease in the L4-L5 and L5-S1 areas.  She send procedure is being considered.  I have asked her to consider professional stretching through several, she will stretch facilities that are available.  The commercial facilities available in the local area stretch zone, stretch med and stretch lab.  Asked her to get clearance from her doctor.  She is willing to try this.  I did indicate to her that it could be a out-of-pocket cost.  There is clinical research evidence showing stretching to reduce pain and improve mobility.      OV 08/17/2023  Subjective:  Patient ID: Shannon [name], female , DOB: 04-Jul-1970 , age 15 y.o. , MRN: [id number] , ADDRESS: [address] [place] [address] PCP Venancio Pock, PA-C Patient Care Team: [name], [name], PA-C as PCP - General (Internal Medicine)  This Provider for this visit: Treatment Team:  Attending Provider: Geronimo Amel, MD  08/17/2023 -   Chief Complaint  Patient presents with   Follow-up     HPI Shannon [name] 54 y.o. -is a routine follow-up.  Last seen in May 2024.  She was then November 2020 for visit.  She says she  continues to be stable.  The symptom score suggests  continued stability.  The exercise hypoxemia test shows continued stability.  In November 2024 she had pulmonary function testing that shows a decline compared to 2022 but also this.  She is gaining a lot of weight.  In the last 1 year she has lost 17 pounds of weight but overall she is morbidly obese.  Past medical issues include spinal stimulator since October 2024 for her back and also being on metformin  for insulin  resistance  No change in ongoing immunosuppressive medication.  CT Chest data from date: march 2023  IMPRESSION: Basilar and subpleural predominant ground-glass and septal thickening with air trapping, similar to 12/22/2019. Assessment is somewhat limited by respiratory motion and body habitus. Nonspecific interstitial pneumonitis is favored. Findings are indeterminate for UIP per consensus guidelines: Diagnosis of Idiopathic Pulmonary Fibrosis: An Official ATS/ERS/JRS/ALAT Clinical Practice Guideline. Am JINNY Honey Crit Care Med Vol 198, Iss 5, [phone], Feb 21 2017.     Electronically Signed   By: Newell Eke M.D.   On: 08/28/2021 11:12   OV 12/01/2023  Subjective:  Patient ID: Shannon [name], female , DOB: March 25, 1971 , age 32 y.o. , MRN: [id number] , ADDRESS: [address] [place] [address] PCP Venancio Pock, PA-C Patient Care Team: Venancio Pock RIGGERS as PCP - General (Internal Medicine)  This Provider for this visit: Treatment Team:  Attending Provider: Geronimo Amel, MD    12/01/2023 -   Chief Complaint  Patient presents with   Follow-up    Follow up , ILD , needs clearance for surgery  for back sugery hasn't  set up as of yet       HPI Shannon [name] 54 y.o. -returns for follow-up.  This visit is to assess for progression of ILD.  Her symptom score seems slowly worse compared to 2 years ago.  She feels because of weight gain.  She is also reporting that the neurostimulator in her  back is repositioned and she requires another surgery and this could also be contributing to increased shortness of breath via virtue of worsening back pain.  Exercise hypoxemia test is stable.  However pulmonary function test shows continued decline.  She did have a high-resolution CT chest in the spring 2025 but this was only compared to earlier in the year and it is noted as stable.  But at least in 2022 pulmonary function test seems to show continued decline both FVC and DLCO.  At this point in time her priority is to get her back surgery.  She continues on CellCept .  OV 02/25/2024  Subjective:  Patient ID: Shannon [name], female , DOB: 1971-06-06 , age 80 y.o. , MRN: [id number] , ADDRESS: [address] [place] [address] PCP Venancio Pock, PA-C Patient Care Team: Venancio Pock RIGGERS as PCP - General (Internal Medicine)  This Provider for this visit: Treatment Team:  Attending Provider: Geronimo Amel, MD    02/25/2024 -   Chief Complaint  Patient presents with   Medical Management of Chronic Issues   Interstitial Lung Disease    PFT repeated today. Breathing has gradually worsened since the last visit. She has occ dry cough.      HPI Shannon [name] 54 y.o. -returns for follow-up.  This concern for progressive phenotype because pulmonary function test has progressively declined between 2022 and also most recently June 2025.  After that she has spinal cord stimulator surgery.  Shortness of breath is stable.  Exercise hypoxemia test is stable.  Pulmonary  function test actually shows an improvement and it is better than 1 year ago although it still worse compared to 2022.  She feels the same.  [facility]  She saw Dr. Austin.  Benlysta has been considered.  Is dated a QuantiFERON gold and it is positive.  Therefore she is being referred to the Department of Public Health.  She is  waiting to hear from them she has had a chest x-ray with them.  She is surprised about this result.  Currently there are no B symptoms but she is immunosuppressed.  I did educate her about latent TB and the risk for active TB in the setting of immunosuppression.      OV 04/06/24  TYIESHA BRACKNEY is a 54 year old female with interstitial lung disease and lupus who presents with worsening cough.  She has experienced a worsening cough over the past two weeks, described as feeling like 'somebody just sitting on my chest.' The cough began a couple of days after receiving a flu shot and has not improved with inhalers or over-the-counter medications. No mucus production, fever, chills, or night sweats are present. She experiences slight shortness of breath but denies postnasal drip or recent increase in acid reflux symptoms.  Her medical history includes interstitial lung disease, for which she is currently on mycophenolate . She is not taking prednisone  at this time. She uses Breztri  inhaler every morning and albuterol  as a rescue inhaler. She was previously on a different inhaler, which was replaced by Breztri . She recalls a past episode of a similar cough that resolved with Breztri  and cough syrup.  She has lupus and is followed by a rheumatologist at [facility]. She was recently diagnosed with latent tuberculosis and has been on treatment for one month. She is allergic to penicillin, aspirin , and sulfa drugs.  No known exposure to sick contacts and no recent changes in her environment or routine.      OV 04/19/2024  Subjective:  Patient ID: Shannon [name], female , DOB: Dec 31, 1970 , age 75 y.o. , MRN: [id number] , ADDRESS: [address] [place] [place] [address] PCP Venancio Pock, PA-C Patient Care Team: Venancio Pock RIGGERS as PCP - General (Internal Medicine)  This Provider for this visit: Treatment Team:  Attending Provider: Geronimo Amel, MD    04/19/2024 -   Chief Complaint   Patient presents with   Acute Visit    Pt states she has  Dry cough for over 3 weeks  that's bothering her, also states CT scan results SOB w/ exertion and w/o since pt has had cough      HPI Shannon [name] 54 y.o. -  NHI BUTRUM is a 55 year old female who presents with a persistent dry cough for three weeks.  This is an acute visit.  I just saw her 6 weeks ago.  Then approximately 1-2 weeks ago she saw Dr. Donzetta in our office.  She is here because she is no better.  She did have a CT scan of the chest April 15, 2024 but I personally tried to see the images and the images are not uploaded.  The results are not back either.  She has experienced a persistent dry cough for three weeks, which began as a mild cough and has progressively worsened. The cough is described as 'real rough bronchial' and intensifies when lying down, causing difficulty  sleeping. It is also exacerbated by talking and drinking cold beverages, while warm tea with honey and lemon provides temporary relief.  She has attempted to manage the cough with Albuterol  and Brevstreet inhalers, as well as over-the-counter cough syrup, but these have not provided relief. She has not used prednisone . The cough is severe enough to cause dizziness and a sensation of near syncope, particularly when coughing in the car. No wheezing or fever is present, but there is slight shortness of breath with intense coughing.  She has a history of seasonal allergies but has not undergone skin testing.  She is willing to get a RAST allergy panel tested  She wants nebulizer treatment in the office.   OV 07/01/2024  Subjective:  Patient ID: Shannon [name], female , DOB: 14-Oct-1970 , age 49 y.o. , MRN: [id number] , ADDRESS: [address] [place] [place] [address] PCP Venancio Pock, PA-C Patient Care Team: Venancio Pock RIGGERS as PCP - General (Internal Medicine)  This Provider for this visit: Treatment Team:  Attending Provider:  Geronimo Amel, MD    07/01/2024 -   Chief Complaint  Patient presents with   Medical Management of Chronic Issues   Interstitial Lung Disease    PFT repeated today. Breathing is overall doing well. Her cough has resolved. She has had painful, swollen feet for the past week.      Follow-up interstitial lung disease due to autoimmune processes not otherwise specified. -Lupus per [facility] clinic notes in December 2021.   - On CellCept   - s/p Prednisone  since April 2016 - Off prednisone  in 2021   - Not on Bactrim prophylaxis due to G6PD deficiency.    - Normal Right heart cath Nov 2016 and feb 2019  -  High-resolution CT -  November 2017 and dec 2018 and dec 2020 without progression. - > July 2021 CTA -. -  HRCT March 2023 - > Ocg 2025; No chnage  Of chronic prednisone  since 2021.   - Last PFT June 2025  HPI IYONNA RISH 54 y.o. -DOREEN GARRETSON is a 54 year old female with interstitial lung disease who presents for follow-up of her respiratory condition.  Her post-viral cough, which persisted from August to October 2025, has resolved. She describes the experience as frightening, feeling as though something was wrong inside her lungs. The severity of the cough led her to stop driving due to concerns about passing out and potentially causing harm.  Her breathing tests in recent years have been similar, but she recalls that they were worse in 2022. OCt 2025 HRCT chest scan showed fibrosis that has not changed.  She confirms receiving her flu shot.    SYMPTOM SCALE - ILD 05/13/2019  07/27/2020  03/05/2021  03/18/2022  10/29/2022  08/17/2023  Cellcept  + Not on pred Singular + 12/01/2023 Cellcept  + Not on pred Singular + 02/25/2024  07/01/2024   O2 use RA ra ra ra ra ra     Shortness of Breath 0 -> 5 scale with 5 being worst (score 6 If unable to do)          At rest 2 1 1 2 3 1 1 3 2   Simple tasks - showers, clothes change, eating, shaving 3 2 2 3 2 2 2 3 3   Household (dishes,  doing bed, laundry) 3 3 4 3 3 2 3 2 3   Shopping 3 4 3  Did not ansewr No aswer 3 3 3 3   Walking level at  own pace 2 3 3 4 2 3 3 3 3   Walking up Stairs 5 5 3 4 1 3 4 4 4   Total (40 - 48) Dyspnea Score 29 18 16 16 11 14 16 18 18   How bad is your cough? 0 2 0 0 2 2 1 1  na  How bad is your fatigue 3 0 3 2 0 4 3 4 4   nausea  0 0 0 0 0 x 0 0  vomit  0 0 0 0 0 x 0 0  diarrhea  0 0 0 4 0 0 0 0  anxiety  4 4 4 3 3  0 3 4  depression  4  3 z 3 4 4 4             SIT STAND TEST - goal 15 times   12/01/2023  02/25/2024   O2 used ra ra  PRobe - finter or forehead fing finter  Number sit and stand completed - goal 15 15 15   Time taken to complete 2 min 41 sec  Resting Pulse Ox/HR/Dyspnea  99% and 99/min and dyspnea of 0/10  97% and R 103  Peak measures 98 % and 115/min and dyspnea of 4/10 98% and HR 114  Final Pulse Ox/HR 99% and 103/min and dyspnea of 2/10 99% and HR 110  Desaturated </= 88% no no  Desaturated <= 3% points no no  Got Tachycardic >/= 90/min yes yes  Miscellaneous comments x Worst dyspnea score 5    CT Chest data from date: oct 2025  - personally visualized and independently interpreted : no - my findings are: as below  IMPRESSION: 1. Examination limited by breath motion artifact; within this limitation no significant change in bibasilar predominant fibrosis featuring areas of subpleural bronchiolectasis primarily at the lung bases without clear evidence of honeycombing. 2. No acute airspace disease. 3. Mosaic attenuation of the airspaces, in keeping with air trapping and suggestive of small airways disease or perhaps alternately a feature of fibrotic interstitial lung disease such as chronic fibrotic hypersensitivity pneumonitis. 4. Unchanged prominent bilateral axillary lymph nodes, nonspecific and likely reactive. 5. Cardiomegaly.     Electronically Signed   By: Marolyn JONETTA Jaksch M.D.   On: 04/20/2024 07:13    PFT     Latest Ref Rng & Units 07/01/2024    11:05 AM 02/25/2024    1:54 PM 12/01/2023   10:52 AM 05/08/2023   10:57 AM 03/05/2021    2:58 PM 07/27/2020    8:55 AM 11/24/2019   11:04 AM  PFT Results  FVC-Pre L 2.32  P 2.26  2.04  2.13  2.64  2.59  2.67   FVC-Predicted Pre % 55  P 53  48  50  73  72  73   Pre FEV1/FVC % % 85  P 90  88  86  85  88  83   FEV1-Pre L 1.98  P 2.02  1.79  1.84  2.24  2.28  2.21   FEV1-Predicted Pre % 59  P 60  53  54  77  78  75   DLCO uncorrected ml/min/mmHg 14.41  P 16.02  14.43  14.28  15.34  19.54  18.56   DLCO UNC% % 57  P 63  57  56  60  76  72   DLCO corrected ml/min/mmHg 14.41  P 16.02  15.93  14.28  15.34  19.54  20.32   DLCO COR %Predicted % 57  P 63  63  56  60  76  79   DLVA Predicted % 102  P 112  115  94  97  117  122     P Preliminary result    Latest Reference Range & Units 04/19/24 15:24  Class Description Allergens  Comment  D Pteronyssinus IgE Class 0 kU/L <0.10  D Farinae IgE Class 0 kU/L <0.10  Cat Dander IgE Class 0 kU/L <0.10  Dog Dander IgE Class 0 kU/L <0.10  Penicillium Chrysogen IgE Class 0 kU/L <0.10  Cladosporium Herbarum IgE Class 0 kU/L <0.10  Aspergillus Fumigatus IgE Class 0 kU/L <0.10  Mucor Racemosus IgE Class 0 kU/L <0.10  Alternaria Alternata IgE Class 0 kU/L <0.10  Stemphylium Herbarum IgE Class 0 kU/L <0.10  Goose Feathers IgE Class 0 kU/L <0.10  Chicken Feathers IgE Class 0 kU/L <0.10  Duck Feathers IgE Class 0 kU/L <0.10  Mouse Urine IgE Class 0 kU/L <0.10  Candida Albicans IgE Class 0 kU/L <0.10  Setomelanomma Rostrat Class 0 kU/L <0.10  Aureobasidi Pullulans IgE Class 0 kU/L <0.10  Phoma Betae IgE Class 0 kU/L <0.10     Latest Reference Range & Units 10/09/14 10:59 10/19/14 10:50 10/27/14 09:53 11/02/14 11:13 11/10/14 09:58 11/16/14 10:15 11/24/14 11:02 12/08/14 13:08 01/16/15 10:09 04/04/15 11:34 07/04/15 17:01 08/17/15 11:21 05/27/16 09:13 09/09/16 11:41 11/27/16 11:02 02/13/17 12:52 06/08/17 16:44 09/23/17 11:22 12/01/17 15:45 02/26/18 10:53 04/05/18  11:27 06/03/18 14:25 03/31/19 10:49 05/13/19 12:43 12/13/21 15:34 08/18/22 19:44 12/15/22 03:05 04/19/24 15:24  Eosinophils Absolute 0.0 - 0.7 K/uL 0.1 0.0 0.0 0.1 0.1 0.0 0.0 0.1 0.1 0.1 0.1 0.1 62 40 41 0.0 0.2 235 0.1 49 0.1 0.1 0.1 0.1 0.1 0.3 0.1 0.1    LAB RESULTS last 96 hours No results found.       has a past medical history of Anemia, Anxiety, Bilateral swelling of feet, Bronchitis, DDD (degenerative disc disease), lumbosacral, Depression, Dilated cardiomyopathy (HCC), Dyspnea, G6PD deficiency, GERD (gastroesophageal reflux disease), Hypertension, ILD (interstitial lung disease) (HCC), Lactose intolerance, Lupus, Osteoarthritis, Palpitations, Pneumonia, Rheumatoid arthritis (HCC), SLE (systemic lupus erythematosus) (HCC), Sleep apnea, and TB (tuberculosis).   reports that she quit smoking about 9 years ago. Her smoking use included cigarettes. She started smoking about 23 years ago. She has a 7 pack-year smoking history. She has never used smokeless tobacco.  Past Surgical History:  Procedure Laterality Date   ABDOMINAL HYSTERECTOMY     APPENDECTOMY  1980   cardiac catherization  08/20/2017   CARDIAC CATHETERIZATION N/A 05/10/2015   Procedure: Right Heart Cath;  Surgeon: Ezra GORMAN Shuck, MD;  [facility]: [facility] INVASIVE CV LAB;  Service: Cardiovascular;  Laterality: N/A;   CESAREAN SECTION  '95, '02, '07   X 3   CHOLECYSTECTOMY  2010   LAPAROSCOPIC HYSTERECTOMY  10/2015   have ovaries   RIGHT HEART CATH N/A 08/20/2017   Procedure: RIGHT HEART CATH;  Surgeon: Shuck Ezra GORMAN, MD;  Location: [facility] INVASIVE CV LAB;  Service: Cardiovascular;  Laterality: N/A;   SHOULDER ARTHROSCOPY WITH ROTATOR CUFF REPAIR AND SUBACROMIAL DECOMPRESSION Left 08/13/2022   Procedure: SHOULDER ARTHROSCOPY WITH SUBACROMIAL DECOMPRESSION, DEBRIDEMENT AND PARTIAL ROTATOR CUFF REPAIR;  Surgeon: Duwayne Purchase, MD;  Location: [facility] ORS;  Service: Orthopedics;  Laterality: Left;   SHOULDER ARTHROSCOPY WITH SUBACROMIAL  DECOMPRESSION Right 08/28/2021   Procedure: SHOULDER ARTHROSCOPY WITH SUBACROMIAL DECOMPRESSION AND DEBRIDEMENT;  Surgeon: Duwayne Purchase, MD;  Location: [facility];  Service: Orthopedics;  Laterality: Right;   SPINAL CORD STIMULATOR BATTERY EXCHANGE N/A  12/21/2023   Procedure: SPINAL CORD STIMULATOR BATTERY EXCHANGE;  Surgeon: Burnetta Aures, MD;  Location: [facility] OR;  Service: Orthopedics;  Laterality: N/A;  REPOSITION SPINAL CORD STIMULATOR BATTERY   SPINAL CORD STIMULATOR INSERTION N/A 03/30/2023   Procedure: SPINAL CORD STIMULATOR INSERTION;  Surgeon: Burnetta Aures, MD;  Location: [facility] OR;  Service: Orthopedics;  Laterality: N/A;  3 C-Bed   TOTAL KNEE ARTHROPLASTY Left 04/04/2019   Procedure: TOTAL KNEE ARTHROPLASTY;  Surgeon: Melodi Lerner, MD;  Location: [facility];  Service: Orthopedics;  Laterality: Left;    TOTAL KNEE ARTHROPLASTY Right 04/30/2020   Procedure: TOTAL KNEE ARTHROPLASTY;  Surgeon: Melodi Lerner, MD;  Location: [facility];  Service: Orthopedics;  Laterality: Right;    TUBAL LIGATION      Allergies[1]  Immunization History  Administered Date(s) Administered   Influenza Split 03/23/2014   Influenza,inj,Quad PF,6+ Mos 03/15/2016, 06/08/2017, 03/08/2018, 02/17/2019, 03/05/2021, 03/18/2022   Influenza-Unspecified 04/03/2020, 03/18/2024   Moderna Sars-Covid-2 Vaccination 09/20/2019, 10/06/2019   PFIZER Comirnaty(Gray Top)Covid-19 Tri-Sucrose Vaccine 11/05/2019, 05/30/2020   PFIZER(Purple Top)SARS-COV-2 Vaccination 10/06/2019, 11/05/2019   Pfizer Covid-19 Vaccine Bivalent Booster 79yrs & up 04/03/2021   Pneumococcal-Unspecified 08/23/2014    Family History  Problem Relation Age of Onset   Sarcoidosis Mother    Lupus Sister    Healthy Daughter    Healthy Son    Healthy Son     Current Medications[2]      Objective:   Vitals:   07/01/24 1133  BP: 128/84  Pulse: (!) 104  SpO2: 97%  Weight: (!) 301 lb (136.5 kg)  Height: 5' 11 (1.803 m)    Estimated body mass  index is 41.98 kg/m as calculated from the following:   Height as of this encounter: 5' 11 (1.803 m).   Weight as of this encounter: 301 lb (136.5 kg).  @WEIGHTCHANGE @  [employer]   07/01/24 1133  Weight: (!) 301 lb (136.5 kg)     Physical Exam   General: No distress. Looks well O2 at rest: no Cane present: no Sitting in wheel chair: no Frail: no Obese: no Neuro: Alert and Oriented x 3. GCS 15. Speech normal Psych: Pleasant Resp:  Barrel Chest - no.  Wheeze - no, Crackles - no, No overt respiratory distress CVS: Normal heart sounds. Murmurs - no Ext: Stigmata of Connective Tissue Disease - no HEENT: Normal upper airway. PEERL +. No post nasal drip        Assessment/     Assessment & Plan Interstitial lung disease due to connective tissue disease (HCC)  Chronic cough    PLAN Patient Instructions  Interstitial lung disease due to connective tissue disease (HCC) High risk medication use Immunosuppressed status  Pulmonary function worse than 2022 but stable 2024 -> 2026 JAn CT chest also shows stable ILD Off prednisone  since 2021 Glad cough is resolved   Plan ' -Continue CellCept  through the rheumatologist at [facility] -Continue Plaquenil  through the rheumatologist at [facility] -If pulmonary fibrosis gets worse then we can add  JASCAYD - Do spirometry and DLCO in 8 months   Quantiferon Gold Positive Aug 2025 at [facility]  - per Public health depatent  History of snoring Witnessed apneic spells OSA    - glad you are seeing Dr MALVA and are on CPAP  Plan  - per Dr MALVA    Follow-up -Return to see Dr. Geronimo 15-minute ILD slot in  8 months  after spirometry and DLCO  - symptoms score and walk  test at followup    FOLLOWUP    No follow-ups on file.    SIGNATURE    Dr. Dorethia Cave, M.D., F.C.C.P,  Pulmonary and Critical Care Medicine Staff Physician, [facility] Director - Interstitial Lung Disease   Program  Pulmonary Fibrosis [facility] at [facility] [place], [place], [address]  Pager: [phone], If no answer or between  15:00h - 7:00h: call [phone] Telephone: [phone]  12:15 PM 07/01/2024     [1]  Allergies Allergen Reactions   Dorethia [Aspirin ] Hives and Shortness Of Breath    Chest tightness    Penicillins Hives     Tolerated Cephalosporin Date: 05/01/20.     Sulfa Antibiotics     G6PD deficiency   [2]  Current Outpatient Medications:    albuterol  (VENTOLIN  HFA) 108 (90 Base) MCG/ACT inhaler, Inhale 2 puffs into the lungs every 6 (six) hours as needed for wheezing or shortness of breath., Disp: 1 each, Rfl: 5   amitriptyline  (ELAVIL ) 100 MG tablet, Take 100 mg by mouth at bedtime., Disp: , Rfl:    budesonide -glycopyrrolate-formoterol  (BREZTRI  AEROSPHERE) 160-9-4.8 MCG/ACT AERO inhaler, INHALE 2 PUFFS IN THE MORNING AND AT BEDTIME, Disp: 11 g, Rfl: 11   buPROPion  (WELLBUTRIN  XL) 300 MG 24 hr tablet, Take 300 mg by mouth daily., Disp: , Rfl:    busPIRone  (BUSPAR ) 15 MG tablet, Take 15 mg by mouth daily., Disp: , Rfl:    cetirizine  (ZYRTEC ) 10 MG tablet, TAKE 1 TABLET BY MOUTH AT BEDTIME, Disp: 30 tablet, Rfl: 1   clindamycin  (CLEOCIN ) 300 MG capsule, Take 1 capsule (300 mg total) by mouth 3 (three) times daily., Disp: 30 capsule, Rfl: 0   cloNIDine  (CATAPRES ) 0.1 MG tablet, Take 0.1 mg by mouth at bedtime., Disp: , Rfl:    famotidine  (PEPCID ) 20 MG tablet, Take 20 mg by mouth 2 (two) times daily., Disp: , Rfl:    hydroxychloroquine  (PLAQUENIL ) 200 MG tablet, Take 400 mg by mouth at bedtime., Disp: , Rfl:    ibuprofen  (ADVIL ) 800 MG tablet, Take 1 tablet (800 mg total) by mouth 3 (three) times daily., Disp: 21 tablet, Rfl: 0   isoniazid  (NYDRAZID) 300 MG tablet, Take by mouth daily., Disp: , Rfl:    Magnesium  250 MG CAPS, Take 1 capsule by mouth daily., Disp: , Rfl:    metoprolol  succinate (TOPROL -XL) 25 MG 24 hr tablet, Take 25 mg by mouth  daily., Disp: , Rfl:    montelukast  (SINGULAIR ) 10 MG tablet, TAKE 1 TABLET BY MOUTH AT BEDTIME, Disp: 30 tablet, Rfl: 11   mycophenolate  (CELLCEPT ) 500 MG tablet, Take 1,000 mg by mouth 2 (two) times daily., Disp: , Rfl:    MYRBETRIQ  25 MG TB24 tablet, Take 25 mg by mouth daily., Disp: , Rfl:    omeprazole (PRILOSEC) 20 MG capsule, Take 20 mg by mouth daily., Disp: , Rfl:    Oxycodone  HCl 10 MG TABS, Take 10 mg by mouth 4 (four) times daily as needed (Pain)., Disp: , Rfl:    predniSONE  (DELTASONE ) 10 MG tablet, Take prednisone  40 mg daily x 2 days, then 30mg  daily x 2 days 20mg  daily x 2 days, then 10mg  daily x 2 days, then 5mg  daily x 2 days and then  STOP, Disp: 22 tablet, Rfl: 0   pyridOXINE (VITAMIN B6) 25 MG tablet, Take 25 mg by mouth daily., Disp: , Rfl:    QUEtiapine  (SEROQUEL  XR) 300 MG 24 hr  tablet, Take 300 mg by mouth at bedtime., Disp: , Rfl:    rifampin (RIFADIN) 150 MG capsule, Take 150 mg by mouth daily., Disp: , Rfl:    sertraline  (ZOLOFT ) 100 MG tablet, Take 200 mg by mouth at bedtime., Disp: , Rfl: 0   VEOZAH  45 MG TABS, Take 1 tablet by mouth daily., Disp: , Rfl:   "
